# Patient Record
Sex: Male | Born: 1935
Health system: Southern US, Community
[De-identification: ages and names within clinical notes are randomized; demographics above are authoritative.]

## PROBLEM LIST (undated history)

## (undated) DIAGNOSIS — M810 Age-related osteoporosis without current pathological fracture: Secondary | ICD-10-CM

## (undated) DIAGNOSIS — K219 Gastro-esophageal reflux disease without esophagitis: Secondary | ICD-10-CM

## (undated) DIAGNOSIS — C61 Malignant neoplasm of prostate: Secondary | ICD-10-CM

## (undated) DIAGNOSIS — Z86010 Personal history of colonic polyps: Secondary | ICD-10-CM

## (undated) DIAGNOSIS — I255 Ischemic cardiomyopathy: Secondary | ICD-10-CM

## (undated) DIAGNOSIS — M549 Dorsalgia, unspecified: Secondary | ICD-10-CM

## (undated) DIAGNOSIS — R55 Syncope and collapse: Secondary | ICD-10-CM

## (undated) DIAGNOSIS — D509 Iron deficiency anemia, unspecified: Secondary | ICD-10-CM

## (undated) DIAGNOSIS — E1129 Type 2 diabetes mellitus with other diabetic kidney complication: Secondary | ICD-10-CM

## (undated) DIAGNOSIS — E785 Hyperlipidemia, unspecified: Secondary | ICD-10-CM

## (undated) DIAGNOSIS — R627 Adult failure to thrive: Secondary | ICD-10-CM

## (undated) DIAGNOSIS — R5381 Other malaise: Secondary | ICD-10-CM

## (undated) DIAGNOSIS — M4850XA Collapsed vertebra, not elsewhere classified, site unspecified, initial encounter for fracture: Secondary | ICD-10-CM

## (undated) DIAGNOSIS — I5042 Chronic combined systolic (congestive) and diastolic (congestive) heart failure: Secondary | ICD-10-CM

## (undated) DIAGNOSIS — N183 Chronic kidney disease, stage 3 (moderate): Secondary | ICD-10-CM

## (undated) DIAGNOSIS — R0600 Dyspnea, unspecified: Secondary | ICD-10-CM

## (undated) DIAGNOSIS — N259 Disorder resulting from impaired renal tubular function, unspecified: Secondary | ICD-10-CM

## (undated) DIAGNOSIS — I951 Orthostatic hypotension: Secondary | ICD-10-CM

## (undated) DIAGNOSIS — Z8719 Personal history of other diseases of the digestive system: Secondary | ICD-10-CM

## (undated) DIAGNOSIS — R634 Abnormal weight loss: Secondary | ICD-10-CM

## (undated) DIAGNOSIS — M79609 Pain in unspecified limb: Secondary | ICD-10-CM

## (undated) DIAGNOSIS — M1990 Unspecified osteoarthritis, unspecified site: Secondary | ICD-10-CM

## (undated) DIAGNOSIS — Z950 Presence of cardiac pacemaker: Secondary | ICD-10-CM

## (undated) DIAGNOSIS — L821 Other seborrheic keratosis: Secondary | ICD-10-CM

## (undated) DIAGNOSIS — M25462 Effusion, left knee: Secondary | ICD-10-CM

## (undated) DIAGNOSIS — H9190 Unspecified hearing loss, unspecified ear: Secondary | ICD-10-CM

## (undated) DIAGNOSIS — I6529 Occlusion and stenosis of unspecified carotid artery: Secondary | ICD-10-CM

## (undated) DIAGNOSIS — E119 Type 2 diabetes mellitus without complications: Secondary | ICD-10-CM

## (undated) DIAGNOSIS — E43 Unspecified severe protein-calorie malnutrition: Secondary | ICD-10-CM

## (undated) DIAGNOSIS — E1351 Other specified diabetes mellitus with diabetic peripheral angiopathy without gangrene: Secondary | ICD-10-CM

## (undated) DIAGNOSIS — N400 Enlarged prostate without lower urinary tract symptoms: Secondary | ICD-10-CM

## (undated) DIAGNOSIS — IMO0001 Reserved for inherently not codable concepts without codable children: Secondary | ICD-10-CM

## (undated) DIAGNOSIS — I739 Peripheral vascular disease, unspecified: Secondary | ICD-10-CM

## (undated) DIAGNOSIS — G629 Polyneuropathy, unspecified: Secondary | ICD-10-CM

## (undated) DIAGNOSIS — I82409 Acute embolism and thrombosis of unspecified deep veins of unspecified lower extremity: Secondary | ICD-10-CM

## (undated) DIAGNOSIS — E1022 Type 1 diabetes mellitus with diabetic chronic kidney disease: Secondary | ICD-10-CM

## (undated) DIAGNOSIS — R5383 Other fatigue: Secondary | ICD-10-CM

## (undated) DIAGNOSIS — F329 Major depressive disorder, single episode, unspecified: Secondary | ICD-10-CM

## (undated) DIAGNOSIS — I441 Atrioventricular block, second degree: Secondary | ICD-10-CM

## (undated) DIAGNOSIS — I10 Essential (primary) hypertension: Secondary | ICD-10-CM

## (undated) DIAGNOSIS — I2510 Atherosclerotic heart disease of native coronary artery without angina pectoris: Secondary | ICD-10-CM

## (undated) DIAGNOSIS — R413 Other amnesia: Secondary | ICD-10-CM

## (undated) DIAGNOSIS — R531 Weakness: Secondary | ICD-10-CM

## (undated) DIAGNOSIS — F411 Generalized anxiety disorder: Secondary | ICD-10-CM

## (undated) HISTORY — DX: Peripheral vascular disease, unspecified: I73.9

## (undated) HISTORY — DX: Age-related osteoporosis without current pathological fracture: M81.0

## (undated) HISTORY — DX: Unspecified severe protein-calorie malnutrition: E43

## (undated) HISTORY — DX: Type 2 diabetes mellitus without complications: E11.9

## (undated) HISTORY — DX: Generalized anxiety disorder: F41.1

## (undated) HISTORY — DX: Collapsed vertebra, not elsewhere classified, site unspecified, initial encounter for fracture: M48.50XA

## (undated) HISTORY — DX: Acute embolism and thrombosis of unspecified deep veins of unspecified lower extremity: I82.409

## (undated) HISTORY — PX: CHOLECYSTECTOMY: SHX55

## (undated) HISTORY — DX: Hyperlipidemia, unspecified: E78.5

## (undated) HISTORY — DX: Dorsalgia, unspecified: M54.9

## (undated) HISTORY — DX: Personal history of other diseases of the digestive system: Z87.19

## (undated) HISTORY — DX: Pain in unspecified limb: M79.609

## (undated) HISTORY — DX: Other specified diabetes mellitus with diabetic peripheral angiopathy without gangrene: E13.51

## (undated) HISTORY — DX: Malignant neoplasm of prostate: C61

## (undated) HISTORY — DX: Chronic kidney disease, stage 3 (moderate): N18.3

## (undated) HISTORY — DX: Other amnesia: R41.3

## (undated) HISTORY — DX: Reserved for inherently not codable concepts without codable children: IMO0001

## (undated) HISTORY — DX: Other malaise: R53.81

## (undated) HISTORY — DX: Adult failure to thrive: R62.7

## (undated) HISTORY — DX: Disorder resulting from impaired renal tubular function, unspecified: N25.9

## (undated) HISTORY — DX: Dyspnea, unspecified: R06.00

## (undated) HISTORY — DX: Unspecified hearing loss, unspecified ear: H91.90

## (undated) HISTORY — PX: CYSTOSCOPY: SUR368

## (undated) HISTORY — DX: Atrioventricular block, second degree: I44.1

## (undated) HISTORY — DX: Benign prostatic hyperplasia without lower urinary tract symptoms: N40.0

## (undated) HISTORY — DX: Essential (primary) hypertension: I10

## (undated) HISTORY — PX: OTHER SURGICAL HISTORY: SHX169

## (undated) HISTORY — DX: Other seborrheic keratosis: L82.1

## (undated) HISTORY — DX: Orthostatic hypotension: I95.1

## (undated) HISTORY — DX: Iron deficiency anemia, unspecified: D50.9

## (undated) HISTORY — DX: Abnormal weight loss: R63.4

## (undated) HISTORY — DX: Other fatigue: R53.83

## (undated) HISTORY — DX: Personal history of colonic polyps: Z86.010

## (undated) HISTORY — DX: Atherosclerotic heart disease of native coronary artery without angina pectoris: I25.10

## (undated) HISTORY — DX: Gastro-esophageal reflux disease without esophagitis: K21.9

## (undated) HISTORY — DX: Major depressive disorder, single episode, unspecified: F32.9

## (undated) HISTORY — DX: Presence of cardiac pacemaker: Z95.0

## (undated) HISTORY — DX: Syncope and collapse: R55

## (undated) HISTORY — DX: Type 2 diabetes mellitus with other diabetic kidney complication: E11.29

## (undated) HISTORY — DX: Occlusion and stenosis of unspecified carotid artery: I65.29

## (undated) HISTORY — DX: Weakness: R53.1

## (undated) HISTORY — DX: Effusion, left knee: M25.462

## (undated) HISTORY — DX: Type 1 diabetes mellitus with diabetic chronic kidney disease: E10.22

## (undated) HISTORY — DX: Unspecified osteoarthritis, unspecified site: M19.90

## (undated) HISTORY — DX: Chronic combined systolic (congestive) and diastolic (congestive) heart failure: I50.42

## (undated) HISTORY — PX: APPENDECTOMY: SHX54

---

## 1990-12-24 HISTORY — PX: OTHER SURGICAL HISTORY: SHX169

## 1998-06-15 ENCOUNTER — Emergency Department (HOSPITAL_COMMUNITY): Admission: EM | Admit: 1998-06-15 | Discharge: 1998-06-15 | Payer: Self-pay | Admitting: Emergency Medicine

## 2000-04-12 ENCOUNTER — Ambulatory Visit (HOSPITAL_COMMUNITY): Admission: RE | Admit: 2000-04-12 | Discharge: 2000-04-12 | Payer: Self-pay | Admitting: Ophthalmology

## 2000-10-14 ENCOUNTER — Encounter: Admission: RE | Admit: 2000-10-14 | Discharge: 2000-10-14 | Payer: Self-pay | Admitting: Orthopedic Surgery

## 2000-10-14 ENCOUNTER — Encounter: Payer: Self-pay | Admitting: Orthopedic Surgery

## 2000-10-15 ENCOUNTER — Ambulatory Visit (HOSPITAL_BASED_OUTPATIENT_CLINIC_OR_DEPARTMENT_OTHER): Admission: RE | Admit: 2000-10-15 | Discharge: 2000-10-15 | Payer: Self-pay | Admitting: Orthopedic Surgery

## 2000-10-21 ENCOUNTER — Encounter: Admission: RE | Admit: 2000-10-21 | Discharge: 2000-11-07 | Payer: Self-pay | Admitting: Orthopedic Surgery

## 2001-10-31 ENCOUNTER — Observation Stay (HOSPITAL_COMMUNITY): Admission: EM | Admit: 2001-10-31 | Discharge: 2001-11-01 | Payer: Self-pay | Admitting: Emergency Medicine

## 2001-10-31 ENCOUNTER — Encounter: Payer: Self-pay | Admitting: Internal Medicine

## 2002-05-24 ENCOUNTER — Encounter: Payer: Self-pay | Admitting: Emergency Medicine

## 2002-05-24 ENCOUNTER — Emergency Department (HOSPITAL_COMMUNITY): Admission: EM | Admit: 2002-05-24 | Discharge: 2002-05-24 | Payer: Self-pay | Admitting: Emergency Medicine

## 2002-08-20 ENCOUNTER — Encounter: Payer: Self-pay | Admitting: Orthopedic Surgery

## 2002-08-20 ENCOUNTER — Inpatient Hospital Stay (HOSPITAL_COMMUNITY): Admission: RE | Admit: 2002-08-20 | Discharge: 2002-08-21 | Payer: Self-pay | Admitting: Orthopedic Surgery

## 2003-02-09 ENCOUNTER — Ambulatory Visit (HOSPITAL_COMMUNITY): Admission: RE | Admit: 2003-02-09 | Discharge: 2003-02-09 | Payer: Self-pay | Admitting: Orthopedic Surgery

## 2003-02-09 ENCOUNTER — Encounter: Payer: Self-pay | Admitting: Orthopedic Surgery

## 2003-02-18 ENCOUNTER — Encounter: Payer: Self-pay | Admitting: Endocrinology

## 2003-02-18 ENCOUNTER — Inpatient Hospital Stay (HOSPITAL_COMMUNITY): Admission: EM | Admit: 2003-02-18 | Discharge: 2003-02-22 | Payer: Self-pay | Admitting: Endocrinology

## 2003-02-19 ENCOUNTER — Encounter (INDEPENDENT_AMBULATORY_CARE_PROVIDER_SITE_OTHER): Payer: Self-pay | Admitting: Specialist

## 2003-04-12 ENCOUNTER — Inpatient Hospital Stay (HOSPITAL_COMMUNITY): Admission: EM | Admit: 2003-04-12 | Discharge: 2003-04-15 | Payer: Self-pay | Admitting: Endocrinology

## 2003-04-12 ENCOUNTER — Encounter: Payer: Self-pay | Admitting: Internal Medicine

## 2004-10-25 ENCOUNTER — Ambulatory Visit: Payer: Self-pay | Admitting: Internal Medicine

## 2004-12-06 ENCOUNTER — Ambulatory Visit: Payer: Self-pay | Admitting: Internal Medicine

## 2004-12-15 ENCOUNTER — Ambulatory Visit: Payer: Self-pay | Admitting: Internal Medicine

## 2005-01-12 ENCOUNTER — Ambulatory Visit: Payer: Self-pay | Admitting: Internal Medicine

## 2005-02-26 ENCOUNTER — Ambulatory Visit: Payer: Self-pay | Admitting: Internal Medicine

## 2005-06-06 ENCOUNTER — Ambulatory Visit: Payer: Self-pay | Admitting: Internal Medicine

## 2005-09-13 ENCOUNTER — Ambulatory Visit: Payer: Self-pay | Admitting: Cardiology

## 2005-09-27 ENCOUNTER — Ambulatory Visit: Payer: Self-pay

## 2005-11-06 ENCOUNTER — Ambulatory Visit: Payer: Self-pay | Admitting: Internal Medicine

## 2005-12-22 ENCOUNTER — Ambulatory Visit: Payer: Self-pay | Admitting: Family Medicine

## 2005-12-24 HISTORY — PX: CORONARY ARTERY BYPASS GRAFT: SHX141

## 2005-12-25 ENCOUNTER — Ambulatory Visit: Payer: Self-pay | Admitting: Internal Medicine

## 2006-03-07 ENCOUNTER — Ambulatory Visit: Payer: Self-pay | Admitting: Internal Medicine

## 2006-03-18 ENCOUNTER — Emergency Department (HOSPITAL_COMMUNITY): Admission: EM | Admit: 2006-03-18 | Discharge: 2006-03-18 | Payer: Self-pay | Admitting: Emergency Medicine

## 2006-05-10 ENCOUNTER — Ambulatory Visit: Payer: Self-pay | Admitting: Internal Medicine

## 2006-05-24 ENCOUNTER — Ambulatory Visit: Payer: Self-pay | Admitting: Internal Medicine

## 2006-06-11 ENCOUNTER — Ambulatory Visit: Payer: Self-pay | Admitting: Internal Medicine

## 2006-06-14 ENCOUNTER — Ambulatory Visit: Payer: Self-pay | Admitting: Internal Medicine

## 2006-06-24 ENCOUNTER — Ambulatory Visit: Payer: Self-pay | Admitting: Internal Medicine

## 2006-07-01 ENCOUNTER — Ambulatory Visit: Payer: Self-pay | Admitting: Internal Medicine

## 2006-07-04 ENCOUNTER — Ambulatory Visit: Payer: Self-pay

## 2006-07-04 ENCOUNTER — Encounter: Payer: Self-pay | Admitting: Cardiology

## 2006-07-25 ENCOUNTER — Encounter (INDEPENDENT_AMBULATORY_CARE_PROVIDER_SITE_OTHER): Payer: Self-pay | Admitting: *Deleted

## 2006-07-25 ENCOUNTER — Ambulatory Visit: Payer: Self-pay | Admitting: Internal Medicine

## 2006-07-26 ENCOUNTER — Ambulatory Visit: Payer: Self-pay | Admitting: *Deleted

## 2006-07-26 ENCOUNTER — Inpatient Hospital Stay (HOSPITAL_COMMUNITY): Admission: EM | Admit: 2006-07-26 | Discharge: 2006-07-30 | Payer: Self-pay | Admitting: Emergency Medicine

## 2006-08-01 ENCOUNTER — Ambulatory Visit: Payer: Self-pay | Admitting: Internal Medicine

## 2006-08-07 ENCOUNTER — Ambulatory Visit: Payer: Self-pay | Admitting: Cardiology

## 2006-08-15 ENCOUNTER — Ambulatory Visit: Payer: Self-pay

## 2006-09-05 ENCOUNTER — Ambulatory Visit: Payer: Self-pay | Admitting: Cardiology

## 2006-10-02 ENCOUNTER — Ambulatory Visit: Payer: Self-pay | Admitting: Internal Medicine

## 2006-10-29 ENCOUNTER — Ambulatory Visit: Payer: Self-pay | Admitting: Internal Medicine

## 2006-11-04 ENCOUNTER — Ambulatory Visit: Payer: Self-pay | Admitting: Internal Medicine

## 2007-01-02 ENCOUNTER — Ambulatory Visit: Payer: Self-pay | Admitting: Internal Medicine

## 2007-01-02 LAB — CONVERTED CEMR LAB
ALT: 18 units/L (ref 0–40)
Albumin: 3.3 g/dL — ABNORMAL LOW (ref 3.5–5.2)
Basophils Relative: 0.8 % (ref 0.0–1.0)
Bilirubin Urine: NEGATIVE
Calcium: 9.8 mg/dL (ref 8.4–10.5)
Chloride: 108 meq/L (ref 96–112)
Chol/HDL Ratio, serum: 4.4
Cholesterol: 120 mg/dL (ref 0–200)
Creatinine, Ser: 1.4 mg/dL (ref 0.4–1.5)
Eosinophil percent: 7.1 % — ABNORMAL HIGH (ref 0.0–5.0)
Glomerular Filtration Rate, Af Am: 63 mL/min/{1.73_m2}
HCT: 33.5 % — ABNORMAL LOW (ref 39.0–52.0)
Hemoglobin: 11.2 g/dL — ABNORMAL LOW (ref 13.0–17.0)
Ketones, ur: NEGATIVE mg/dL
Lymphocytes Relative: 19.9 % (ref 12.0–46.0)
MCHC: 33.5 g/dL (ref 30.0–36.0)
MCV: 97.4 fL (ref 78.0–100.0)
Microalb Creat Ratio: 2.9 mg/g (ref 0.0–30.0)
Neutrophils Relative %: 61.1 % (ref 43.0–77.0)
Nitrite: NEGATIVE
Platelets: 234 10*3/uL (ref 150–400)
RBC: 3.44 M/uL — ABNORMAL LOW (ref 4.22–5.81)
RDW: 13.5 % (ref 11.5–14.6)
Specific Gravity, Urine: >=1.03 (ref 1.000–1.03)
TSH: 2.4 microintl units/mL (ref 0.35–5.50)
Total Protein, Urine: NEGATIVE mg/dL
Triglyceride fasting, serum: 96 mg/dL (ref 0–149)
WBC: 5.4 10*3/uL (ref 4.5–10.5)
pH: 5.5 (ref 5.0–8.0)

## 2007-01-16 ENCOUNTER — Ambulatory Visit: Payer: Self-pay | Admitting: Cardiology

## 2007-03-15 ENCOUNTER — Emergency Department (HOSPITAL_COMMUNITY): Admission: EM | Admit: 2007-03-15 | Discharge: 2007-03-16 | Payer: Self-pay | Admitting: Emergency Medicine

## 2007-03-19 ENCOUNTER — Ambulatory Visit: Payer: Self-pay | Admitting: Cardiology

## 2007-05-27 DIAGNOSIS — Z860100 Personal history of colon polyps, unspecified: Secondary | ICD-10-CM

## 2007-05-27 DIAGNOSIS — Z8719 Personal history of other diseases of the digestive system: Secondary | ICD-10-CM | POA: Insufficient documentation

## 2007-05-27 DIAGNOSIS — D509 Iron deficiency anemia, unspecified: Secondary | ICD-10-CM | POA: Insufficient documentation

## 2007-05-27 HISTORY — DX: Personal history of colonic polyps: Z86.010

## 2007-05-27 HISTORY — DX: Personal history of other diseases of the digestive system: Z87.19

## 2007-05-27 HISTORY — DX: Iron deficiency anemia, unspecified: D50.9

## 2007-05-27 HISTORY — DX: Personal history of colon polyps, unspecified: Z86.0100

## 2007-09-23 ENCOUNTER — Ambulatory Visit: Payer: Self-pay | Admitting: Internal Medicine

## 2007-09-26 ENCOUNTER — Encounter: Admission: RE | Admit: 2007-09-26 | Discharge: 2007-09-26 | Payer: Self-pay | Admitting: Internal Medicine

## 2007-09-30 ENCOUNTER — Ambulatory Visit: Payer: Self-pay

## 2007-10-21 ENCOUNTER — Encounter: Payer: Self-pay | Admitting: Internal Medicine

## 2007-11-03 ENCOUNTER — Ambulatory Visit: Payer: Self-pay | Admitting: Cardiology

## 2007-11-14 ENCOUNTER — Ambulatory Visit: Payer: Self-pay | Admitting: Internal Medicine

## 2007-11-14 DIAGNOSIS — K219 Gastro-esophageal reflux disease without esophagitis: Secondary | ICD-10-CM

## 2007-11-14 DIAGNOSIS — F329 Major depressive disorder, single episode, unspecified: Secondary | ICD-10-CM | POA: Insufficient documentation

## 2007-11-14 DIAGNOSIS — N400 Enlarged prostate without lower urinary tract symptoms: Secondary | ICD-10-CM | POA: Insufficient documentation

## 2007-11-14 DIAGNOSIS — F411 Generalized anxiety disorder: Secondary | ICD-10-CM

## 2007-11-14 DIAGNOSIS — E785 Hyperlipidemia, unspecified: Secondary | ICD-10-CM

## 2007-11-14 DIAGNOSIS — I739 Peripheral vascular disease, unspecified: Secondary | ICD-10-CM

## 2007-11-14 DIAGNOSIS — I2510 Atherosclerotic heart disease of native coronary artery without angina pectoris: Secondary | ICD-10-CM | POA: Insufficient documentation

## 2007-11-14 DIAGNOSIS — I10 Essential (primary) hypertension: Secondary | ICD-10-CM

## 2007-11-14 DIAGNOSIS — N259 Disorder resulting from impaired renal tubular function, unspecified: Secondary | ICD-10-CM

## 2007-11-14 DIAGNOSIS — E119 Type 2 diabetes mellitus without complications: Secondary | ICD-10-CM

## 2007-11-14 DIAGNOSIS — F3289 Other specified depressive episodes: Secondary | ICD-10-CM

## 2007-11-14 DIAGNOSIS — I252 Old myocardial infarction: Secondary | ICD-10-CM | POA: Insufficient documentation

## 2007-11-14 HISTORY — DX: Disorder resulting from impaired renal tubular function, unspecified: N25.9

## 2007-11-14 HISTORY — DX: Peripheral vascular disease, unspecified: I73.9

## 2007-11-14 HISTORY — DX: Essential (primary) hypertension: I10

## 2007-11-14 HISTORY — DX: Generalized anxiety disorder: F41.1

## 2007-11-14 HISTORY — DX: Atherosclerotic heart disease of native coronary artery without angina pectoris: I25.10

## 2007-11-14 HISTORY — DX: Type 2 diabetes mellitus without complications: E11.9

## 2007-11-14 HISTORY — DX: Hyperlipidemia, unspecified: E78.5

## 2007-11-14 HISTORY — DX: Other specified depressive episodes: F32.89

## 2007-11-14 HISTORY — DX: Benign prostatic hyperplasia without lower urinary tract symptoms: N40.0

## 2007-11-14 HISTORY — DX: Gastro-esophageal reflux disease without esophagitis: K21.9

## 2007-11-14 HISTORY — DX: Major depressive disorder, single episode, unspecified: F32.9

## 2007-11-21 ENCOUNTER — Telehealth (INDEPENDENT_AMBULATORY_CARE_PROVIDER_SITE_OTHER): Payer: Self-pay | Admitting: *Deleted

## 2008-01-22 ENCOUNTER — Ambulatory Visit: Payer: Self-pay | Admitting: Internal Medicine

## 2008-01-22 DIAGNOSIS — R21 Rash and other nonspecific skin eruption: Secondary | ICD-10-CM

## 2008-01-24 LAB — CONVERTED CEMR LAB
CO2: 24 meq/L (ref 19–32)
Chloride: 111 meq/L (ref 96–112)
GFR calc non Af Amer: 48 mL/min
LDL Cholesterol: 82 mg/dL (ref 0–99)
Sodium: 141 meq/L (ref 135–145)
Total CHOL/HDL Ratio: 4.6
VLDL: 21 mg/dL (ref 0–40)

## 2008-02-02 ENCOUNTER — Ambulatory Visit: Payer: Self-pay | Admitting: Internal Medicine

## 2008-02-02 DIAGNOSIS — Z950 Presence of cardiac pacemaker: Secondary | ICD-10-CM

## 2008-02-02 DIAGNOSIS — L03211 Cellulitis of face: Secondary | ICD-10-CM

## 2008-02-02 DIAGNOSIS — L0201 Cutaneous abscess of face: Secondary | ICD-10-CM

## 2008-02-02 HISTORY — DX: Presence of cardiac pacemaker: Z95.0

## 2008-02-20 ENCOUNTER — Ambulatory Visit: Payer: Self-pay | Admitting: Internal Medicine

## 2008-04-05 ENCOUNTER — Encounter: Payer: Self-pay | Admitting: Internal Medicine

## 2008-05-03 ENCOUNTER — Ambulatory Visit: Payer: Self-pay | Admitting: Cardiology

## 2008-05-10 ENCOUNTER — Encounter: Payer: Self-pay | Admitting: Internal Medicine

## 2008-06-01 ENCOUNTER — Ambulatory Visit: Payer: Self-pay | Admitting: Internal Medicine

## 2008-06-01 DIAGNOSIS — R413 Other amnesia: Secondary | ICD-10-CM

## 2008-06-01 HISTORY — DX: Other amnesia: R41.3

## 2008-06-16 ENCOUNTER — Encounter: Payer: Self-pay | Admitting: Internal Medicine

## 2008-08-27 ENCOUNTER — Telehealth: Payer: Self-pay | Admitting: Internal Medicine

## 2008-09-08 ENCOUNTER — Ambulatory Visit: Payer: Self-pay | Admitting: Internal Medicine

## 2008-09-08 DIAGNOSIS — R55 Syncope and collapse: Secondary | ICD-10-CM | POA: Insufficient documentation

## 2008-09-08 HISTORY — DX: Syncope and collapse: R55

## 2008-09-08 LAB — CONVERTED CEMR LAB
Albumin: 3.8 g/dL (ref 3.5–5.2)
Basophils Absolute: 0 10*3/uL (ref 0.0–0.1)
Chloride: 112 meq/L (ref 96–112)
Cholesterol: 161 mg/dL (ref 0–200)
GFR calc Af Amer: 47 mL/min
GFR calc non Af Amer: 38 mL/min
Glucose, Bld: 153 mg/dL — ABNORMAL HIGH (ref 70–99)
HCT: 38.8 % — ABNORMAL LOW (ref 39.0–52.0)
HDL: 27.1 mg/dL — ABNORMAL LOW (ref >39.0)
Hemoglobin: 13.6 g/dL (ref 13.0–17.0)
LDL Cholesterol: 100 mg/dL — ABNORMAL HIGH (ref 0–99)
Lymphocytes Relative: 28.9 % (ref 12.0–46.0)
Monocytes Relative: 9.2 % (ref 3.0–12.0)
Potassium: 5.5 meq/L — ABNORMAL HIGH (ref 3.5–5.1)
RDW: 13.9 % (ref 11.5–14.6)
Sodium: 140 meq/L (ref 135–145)
Triglycerides: 169 mg/dL — ABNORMAL HIGH (ref 0–149)
VLDL: 34 mg/dL (ref 0–40)

## 2008-09-17 ENCOUNTER — Encounter: Payer: Self-pay | Admitting: Internal Medicine

## 2008-09-17 ENCOUNTER — Ambulatory Visit: Payer: Self-pay

## 2008-09-21 ENCOUNTER — Ambulatory Visit: Payer: Self-pay | Admitting: Cardiology

## 2008-09-24 ENCOUNTER — Ambulatory Visit: Payer: Self-pay | Admitting: Internal Medicine

## 2008-09-24 DIAGNOSIS — R109 Unspecified abdominal pain: Secondary | ICD-10-CM

## 2008-09-24 LAB — CONVERTED CEMR LAB
Bilirubin Urine: NEGATIVE
Crystals: NEGATIVE
Leukocytes, UA: NEGATIVE
Mucus, UA: NEGATIVE
Nitrite: NEGATIVE
Urobilinogen, UA: 0.2 (ref 0.0–1.0)

## 2008-09-30 ENCOUNTER — Telehealth: Payer: Self-pay | Admitting: Internal Medicine

## 2008-10-05 ENCOUNTER — Telehealth: Payer: Self-pay | Admitting: Internal Medicine

## 2008-10-20 ENCOUNTER — Encounter: Payer: Self-pay | Admitting: Internal Medicine

## 2008-11-09 ENCOUNTER — Ambulatory Visit: Payer: Self-pay | Admitting: Cardiology

## 2009-02-09 ENCOUNTER — Ambulatory Visit: Payer: Self-pay | Admitting: Cardiology

## 2009-03-18 ENCOUNTER — Encounter: Payer: Self-pay | Admitting: Internal Medicine

## 2009-03-31 ENCOUNTER — Ambulatory Visit: Payer: Self-pay | Admitting: Internal Medicine

## 2009-03-31 LAB — CONVERTED CEMR LAB
Calcium: 9.1 mg/dL (ref 8.4–10.5)
Chloride: 109 meq/L (ref 96–112)
GFR calc non Af Amer: 51.35 mL/min (ref >60)
Glucose, Bld: 168 mg/dL — ABNORMAL HIGH (ref 70–99)
Total CHOL/HDL Ratio: 6
Triglycerides: 238 mg/dL — ABNORMAL HIGH (ref 0.0–149.0)
VLDL: 47.6 mg/dL — ABNORMAL HIGH (ref 0.0–40.0)

## 2009-04-27 ENCOUNTER — Telehealth: Payer: Self-pay | Admitting: Internal Medicine

## 2009-05-13 ENCOUNTER — Ambulatory Visit: Payer: Self-pay | Admitting: Internal Medicine

## 2009-05-13 DIAGNOSIS — H9190 Unspecified hearing loss, unspecified ear: Secondary | ICD-10-CM

## 2009-05-13 HISTORY — DX: Unspecified hearing loss, unspecified ear: H91.90

## 2009-05-17 ENCOUNTER — Encounter: Payer: Self-pay | Admitting: Internal Medicine

## 2009-07-05 ENCOUNTER — Ambulatory Visit: Payer: Self-pay | Admitting: Cardiology

## 2009-09-05 ENCOUNTER — Ambulatory Visit: Payer: Self-pay | Admitting: Internal Medicine

## 2009-09-05 ENCOUNTER — Encounter: Payer: Self-pay | Admitting: Internal Medicine

## 2009-09-05 DIAGNOSIS — M4850XA Collapsed vertebra, not elsewhere classified, site unspecified, initial encounter for fracture: Secondary | ICD-10-CM

## 2009-09-05 DIAGNOSIS — M549 Dorsalgia, unspecified: Secondary | ICD-10-CM

## 2009-09-05 DIAGNOSIS — I441 Atrioventricular block, second degree: Secondary | ICD-10-CM

## 2009-09-05 HISTORY — DX: Collapsed vertebra, not elsewhere classified, site unspecified, initial encounter for fracture: M48.50XA

## 2009-09-05 HISTORY — DX: Atrioventricular block, second degree: I44.1

## 2009-09-06 ENCOUNTER — Telehealth: Payer: Self-pay | Admitting: Internal Medicine

## 2009-09-06 LAB — CONVERTED CEMR LAB
BUN: 29 mg/dL — ABNORMAL HIGH (ref 6–23)
Calcium: 9.4 mg/dL (ref 8.4–10.5)
Creatinine, Ser: 1.2 mg/dL (ref 0.4–1.5)
GFR calc non Af Amer: 61.28 mL/min (ref >60)
Glucose, Bld: 289 mg/dL — ABNORMAL HIGH (ref 70–99)

## 2009-09-07 ENCOUNTER — Ambulatory Visit: Payer: Self-pay | Admitting: Internal Medicine

## 2009-09-07 ENCOUNTER — Encounter: Payer: Self-pay | Admitting: Internal Medicine

## 2009-09-19 ENCOUNTER — Encounter: Payer: Self-pay | Admitting: Internal Medicine

## 2009-09-22 ENCOUNTER — Telehealth: Payer: Self-pay | Admitting: Internal Medicine

## 2009-09-22 ENCOUNTER — Encounter (INDEPENDENT_AMBULATORY_CARE_PROVIDER_SITE_OTHER): Payer: Self-pay | Admitting: *Deleted

## 2009-10-04 ENCOUNTER — Ambulatory Visit: Payer: Self-pay

## 2009-10-04 ENCOUNTER — Encounter: Payer: Self-pay | Admitting: Cardiology

## 2009-11-11 ENCOUNTER — Telehealth: Payer: Self-pay | Admitting: Internal Medicine

## 2009-12-21 ENCOUNTER — Encounter: Payer: Self-pay | Admitting: Internal Medicine

## 2010-03-07 ENCOUNTER — Ambulatory Visit: Payer: Self-pay | Admitting: Internal Medicine

## 2010-04-10 ENCOUNTER — Encounter: Payer: Self-pay | Admitting: Internal Medicine

## 2010-06-02 ENCOUNTER — Ambulatory Visit: Payer: Self-pay | Admitting: Internal Medicine

## 2010-06-02 DIAGNOSIS — M810 Age-related osteoporosis without current pathological fracture: Secondary | ICD-10-CM | POA: Insufficient documentation

## 2010-06-02 HISTORY — DX: Age-related osteoporosis without current pathological fracture: M81.0

## 2010-06-05 LAB — CONVERTED CEMR LAB
AST: 21 units/L (ref 0–37)
Basophils Absolute: 0 10*3/uL (ref 0.0–0.1)
Basophils Relative: 0.2 % (ref 0.0–3.0)
Calcium: 9.3 mg/dL (ref 8.4–10.5)
Chloride: 109 meq/L (ref 96–112)
Cholesterol: 147 mg/dL (ref 0–200)
Eosinophils Relative: 3.8 % (ref 0.0–5.0)
Folate: 7.1 ng/mL
GFR calc non Af Amer: 52.5 mL/min (ref >60)
Glucose, Bld: 120 mg/dL — ABNORMAL HIGH (ref 70–99)
LDL Cholesterol: 83 mg/dL (ref 0–99)
Leukocytes, UA: NEGATIVE
MCHC: 34.8 g/dL (ref 30.0–36.0)
MCV: 92 fL (ref 78.0–100.0)
Microalb Creat Ratio: 4.2 mg/g (ref 0.0–30.0)
Platelets: 149 10*3/uL — ABNORMAL LOW (ref 150.0–400.0)
RBC: 4.89 M/uL (ref 4.22–5.81)
RDW: 13.4 % (ref 11.5–14.6)
Specific Gravity, Urine: >=1.03 (ref 1.000–1.030)
TSH: 1.83 microintl units/mL (ref 0.35–5.50)
Urine Glucose: 100 mg/dL
Urobilinogen, UA: 0.2 (ref 0.0–1.0)
VLDL: 29 mg/dL (ref 0.0–40.0)
WBC: 7.2 10*3/uL (ref 4.5–10.5)
pH: 5 (ref 5.0–8.0)

## 2010-06-06 ENCOUNTER — Telehealth: Payer: Self-pay | Admitting: Internal Medicine

## 2010-06-06 LAB — CONVERTED CEMR LAB
Calcium, Total (PTH): 9.5 mg/dL (ref 8.4–10.5)
PTH: 101.6 pg/mL — ABNORMAL HIGH (ref 14.0–72.0)

## 2010-07-19 ENCOUNTER — Ambulatory Visit: Payer: Self-pay | Admitting: Cardiology

## 2010-07-19 ENCOUNTER — Ambulatory Visit: Payer: Self-pay | Admitting: Internal Medicine

## 2010-07-19 LAB — CONVERTED CEMR LAB
CO2: 25 meq/L (ref 19–32)
Calcium: 8.3 mg/dL — ABNORMAL LOW (ref 8.4–10.5)
Chloride: 99 meq/L (ref 96–112)
Cholesterol: 187 mg/dL (ref 0–200)
Direct LDL: 85.6 mg/dL
Glucose, Bld: 179 mg/dL — ABNORMAL HIGH (ref 70–99)

## 2010-10-02 ENCOUNTER — Encounter: Payer: Self-pay | Admitting: Internal Medicine

## 2010-10-02 DIAGNOSIS — I6529 Occlusion and stenosis of unspecified carotid artery: Secondary | ICD-10-CM

## 2010-10-02 HISTORY — DX: Occlusion and stenosis of unspecified carotid artery: I65.29

## 2010-10-31 ENCOUNTER — Encounter: Payer: Self-pay | Admitting: Internal Medicine

## 2010-11-03 ENCOUNTER — Telehealth: Payer: Self-pay | Admitting: Cardiology

## 2010-11-07 ENCOUNTER — Ambulatory Visit: Payer: Self-pay

## 2010-11-07 ENCOUNTER — Encounter: Payer: Self-pay | Admitting: Cardiovascular Disease

## 2010-11-07 DIAGNOSIS — E1351 Other specified diabetes mellitus with diabetic peripheral angiopathy without gangrene: Secondary | ICD-10-CM

## 2010-11-07 HISTORY — DX: Other specified diabetes mellitus with diabetic peripheral angiopathy without gangrene: E13.51

## 2010-11-09 ENCOUNTER — Ambulatory Visit: Payer: Self-pay | Admitting: Cardiovascular Disease

## 2010-11-27 ENCOUNTER — Telehealth: Payer: Self-pay | Admitting: Internal Medicine

## 2010-12-01 ENCOUNTER — Ambulatory Visit: Payer: Self-pay | Admitting: Internal Medicine

## 2010-12-01 LAB — CONVERTED CEMR LAB
Calcium: 9.2 mg/dL (ref 8.4–10.5)
Cholesterol: 139 mg/dL (ref 0–200)
Creatinine, Ser: 1.1 mg/dL (ref 0.4–1.5)
HDL: 27.2 mg/dL — ABNORMAL LOW (ref >39.00)
Hgb A1c MFr Bld: 7 % — ABNORMAL HIGH (ref 4.6–6.5)
Potassium: 5.3 meq/L — ABNORMAL HIGH (ref 3.5–5.1)
Sodium: 140 meq/L (ref 135–145)
Triglycerides: 174 mg/dL — ABNORMAL HIGH (ref 0.0–149.0)

## 2010-12-08 ENCOUNTER — Ambulatory Visit: Payer: Self-pay | Admitting: Internal Medicine

## 2010-12-08 DIAGNOSIS — M79609 Pain in unspecified limb: Secondary | ICD-10-CM

## 2010-12-08 HISTORY — DX: Pain in unspecified limb: M79.609

## 2010-12-24 HISTORY — PX: PACEMAKER INSERTION: SHX728

## 2011-01-15 ENCOUNTER — Ambulatory Visit
Admission: RE | Admit: 2011-01-15 | Discharge: 2011-01-15 | Payer: Self-pay | Source: Home / Self Care | Attending: Cardiology | Admitting: Cardiology

## 2011-01-15 ENCOUNTER — Telehealth: Payer: Self-pay | Admitting: Internal Medicine

## 2011-01-15 DIAGNOSIS — R609 Edema, unspecified: Secondary | ICD-10-CM | POA: Insufficient documentation

## 2011-01-18 ENCOUNTER — Telehealth: Payer: Self-pay | Admitting: Internal Medicine

## 2011-01-21 LAB — CONVERTED CEMR LAB
BUN: 25 mg/dL — ABNORMAL HIGH (ref 6–23)
Basophils Absolute: 0.2 10*3/uL — ABNORMAL HIGH (ref 0.0–0.1)
Bilirubin, Direct: 0.2 mg/dL (ref 0.0–0.3)
Calcium: 8.9 mg/dL (ref 8.4–10.5)
Cholesterol: 133 mg/dL (ref 0–200)
Eosinophils Relative: 6.6 % — ABNORMAL HIGH (ref 0.0–5.0)
GFR calc Af Amer: 62 mL/min
GFR calc non Af Amer: 52 mL/min
Glucose, Bld: 138 mg/dL — ABNORMAL HIGH (ref 70–99)
HCT: 39.9 % (ref 39.0–52.0)
HDL: 23.2 mg/dL — ABNORMAL LOW (ref >39.0)
Hemoglobin, Urine: NEGATIVE
Hemoglobin: 13.6 g/dL (ref 13.0–17.0)
Iron: 213 ug/dL — ABNORMAL HIGH (ref 42–165)
Leukocytes, UA: NEGATIVE
MCHC: 34.2 g/dL (ref 30.0–36.0)
MCV: 93 fL (ref 78.0–100.0)
Microalb, Ur: 2.5 mg/dL — ABNORMAL HIGH (ref 0.0–1.9)
Monocytes Absolute: 0.4 10*3/uL (ref 0.1–1.0)
Neutrophils Relative %: 54.4 % (ref 43.0–77.0)
PSA: 1.02 ng/mL (ref 0.10–4.00)
RBC: 4.29 M/uL (ref 4.22–5.81)
RDW: 13 % (ref 11.5–14.6)
Saturation Ratios: 52.4 % — ABNORMAL HIGH (ref 20.0–50.0)
Specific Gravity, Urine: >=1.03 (ref 1.000–1.03)
TSH: 2.42 microintl units/mL (ref 0.35–5.50)
Total Bilirubin: 1.1 mg/dL (ref 0.3–1.2)
Total Protein: 6.4 g/dL (ref 6.0–8.3)
Triglycerides: 111 mg/dL (ref 0–149)
VLDL: 22 mg/dL (ref 0–40)
Vitamin B-12: 375 pg/mL (ref 211–911)

## 2011-01-24 ENCOUNTER — Telehealth: Payer: Self-pay | Admitting: Internal Medicine

## 2011-01-25 ENCOUNTER — Telehealth: Payer: Self-pay | Admitting: Internal Medicine

## 2011-01-25 NOTE — Assessment & Plan Note (Signed)
Summary: 6 mos f/u #/cd   Vital Signs:  Patient profile:   77 year old male Height:      69.5 inches Weight:      200.13 pounds BMI:     29.24 O2 Sat:      97 % on Room air Temp:     97.4 degrees F oral Pulse rate:   54 / minute BP sitting:   124 / 80  (left arm) Cuff size:   regular  Vitals Entered By: Zella Ball Ewing CMA Duncan Dull) (December 08, 2010 9:15 AM)  O2 Flow:  Room air  Preventive Care Screening  Last Flu Shot:    Date:  09/23/2010    Results:  given   CC: 6 month ROV/RE   Primary Care Provider:  Oliver Barre MD  CC:  6 month ROV/RE.  History of Present Illness: here today to f/u with c/o persistent feet and lower leg pain , with recent PAD eval not c/w this pain;  Pt denies CP, worsening sob, doe, wheezing, orthopnea, pnd, worsening LE edema, palps, dizziness or syncope  Pt denies new neuro symptoms such as headache, facial or extremity weakness  Pt denies polydipsia, polyuria  Overall good compliance with meds, trying to follow low chol  diet, wt stable, little excercise however  No fever, wt loss, night sweats, loss of appetite or other constitutional symptoms  Overall good compliance with meds, and good tolerability.  Denies worsening depressive symptoms, suicidal ideation, or panic.    Preventive Screening-Counseling & Management      Drug Use:  no.    Problems Prior to Update: 1)  Foot Pain, Bilateral  (ICD-729.5) 2)  Secondary Dm W/peripheral Circ D/o Uncontrolled  (ICD-249.71) 3)  Carotid Artery Disease  (ICD-433.10) 4)  Osteoporosis  (ICD-733.00) 5)  Vertebral Fracture  (ICD-805.8) 6)  Back Pain  (ICD-724.5) 7)  Myocardial Infarction, Hx of  (ICD-412) 8)  Coronary Artery Disease  (ICD-414.00) 9)  Peripheral Vascular Disease  (ICD-443.9) 10)  Hyperlipidemia  (ICD-272.4) 11)  Hypertension  (ICD-401.9) 12)  Syncope  (ICD-780.2) 13)  Renal Insufficiency  (ICD-588.9) 14)  Gerd  (ICD-530.81) 15)  Diabetes Mellitus, Type II  (ICD-250.00) 16)  Anxiety   (ICD-300.00) 17)  Depression  (ICD-311) 18)  Benign Prostatic Hypertrophy  (ICD-600.00) 19)  Unspecified Hearing Loss  (ICD-389.9) 20)  Groin Pain  (ICD-789.09) 21)  Memory Loss  (ICD-780.93) 22)  Preventive Health Care  (ICD-V70.0) 23)  Cellulitis, Face  (ICD-682.0) 24)  Rash-nonvesicular  (ICD-782.1) 25)  Family History Diabetes 1st Degree Relative  (ICD-V18.0) 26)  Anemia-iron Deficiency  (ICD-280.9) 27)  Diverticulitis, Hx of  (ICD-V12.79) 28)  Colonic Polyps, Hx of  (ICD-V12.72)  Medications Prior to Update: 1)  Lisinopril 40 Mg Tabs (Lisinopril) .... Take 1 Tablet By Mouth Once A Day 2)  Metoprolol Succinate 50 Mg  Tb24 (Metoprolol Succinate) .Marland Kitchen.. 1po Qd 3)  Aspirin Ec 325 Mg Tbec (Aspirin) .... Take One Tablet By Mouth Daily 4)  Lipitor 40 Mg Tabs (Atorvastatin Calcium) .... One By Mouth Daily 5)  Uroxatral 10 Mg Tb24 (Alfuzosin Hcl) .... Take 1 Tablet By Mouth Once A Day 6)  Detrol La 4 Mg Xr24h-Cap (Tolterodine Tartrate) .Marland Kitchen.. 1 By Mouth Once Daily 7)  Tricor 145 Mg  Tabs (Fenofibrate) .Marland Kitchen.. 1 By Mouth Qd 8)  Glimepiride 4 Mg Tabs (Glimepiride) .Marland Kitchen.. 1 By Mouth Once Daily 9)  Onetouch Ultra Test  Strp (Glucose Blood) .... Use Asd 1 Once Daily 10)  Onetouch Lancets  Misc (Lancets) .... Use Asd 1 Once Daily 11)  Flexeril 5 Mg Tabs (Cyclobenzaprine Hcl) .Marland Kitchen.. 1po Three Times A Day As Needed Pain 12)  Actos 30 Mg Tabs (Pioglitazone Hcl) .Marland Kitchen.. 1 By Mouth Once Daily 13)  Metformin Hcl 500 Mg Tabs (Metformin Hcl) .... 2 By Mouth in The Am, and 1 By Mouth in The Pm 14)  Alendronate Sodium 70 Mg Tabs (Alendronate Sodium) .Marland Kitchen.. 1 By Mouth Q Wk 15)  Hydrocodone-Homatropine 5-1.5 Mg/37ml Syrp (Hydrocodone-Homatropine) .Marland Kitchen.. 1 Tsp By Mouth Q 6 Hrs As Needed Cough  Current Medications (verified): 1)  Lisinopril 40 Mg Tabs (Lisinopril) .... Take 1 Tablet By Mouth Once A Day 2)  Metoprolol Succinate 50 Mg  Tb24 (Metoprolol Succinate) .Marland Kitchen.. 1po Once Daily 3)  Aspirin Ec 325 Mg Tbec (Aspirin) ....  Take One Tablet By Mouth Daily 4)  Lipitor 40 Mg Tabs (Atorvastatin Calcium) .... One By Mouth Daily 5)  Detrol La 4 Mg Xr24h-Cap (Tolterodine Tartrate) .Marland Kitchen.. 1 By Mouth Once Daily 6)  Fenofibrate 160 Mg Tabs (Fenofibrate) .Marland Kitchen.. 1po Once Daily 7)  Glimepiride 4 Mg Tabs (Glimepiride) .Marland Kitchen.. 1 By Mouth Once Daily 8)  Onetouch Ultra Test  Strp (Glucose Blood) .... Use Asd 1 Once Daily 9)  Onetouch Lancets  Misc (Lancets) .... Use Asd 1 Once Daily 10)  Actos 30 Mg Tabs (Pioglitazone Hcl) .Marland Kitchen.. 1 By Mouth Once Daily 11)  Metformin Hcl 500 Mg Tabs (Metformin Hcl) .... 2 By Mouth in The Am, and 1 By Mouth in The Pm 12)  Alendronate Sodium 70 Mg Tabs (Alendronate Sodium) .Marland Kitchen.. 1 By Mouth Q Wk 13)  Cymbalta 60 Mg Cpep (Duloxetine Hcl) .Marland Kitchen.. 1po Once Daily  Allergies (verified): No Known Drug Allergies  Past History:  Past Medical History: Last updated: 11/09/2010 MYOCARDIAL INFARCTION, HX OF (ICD-412) 8/07 CORONARY ARTERY DISEASE (ICD-414.00) s/p CABG PERIPHERAL VASCULAR DISEASE (ICD-443.9) HYPERLIPIDEMIA (ICD-272.4) HYPERTENSION (ICD-401.9) SYNCOPE (ICD-780.2) RENAL INSUFFICIENCY (ICD-588.9) GERD (ICD-530.81) DIABETES MELLITUS, TYPE II (ICD-250.00) ANXIETY (ICD-300.00) DEPRESSION (ICD-311) BENIGN PROSTATIC HYPERTROPHY (ICD-600.00) UNSPECIFIED HEARING LOSS (ICD-389.9) GROIN PAIN (ICD-789.09) MEMORY LOSS (ICD-780.93) PREVENTIVE HEALTH CARE (ICD-V70.0) CELLULITIS, FACE (ICD-682.0) RASH-NONVESICULAR (ICD-782.1) FAMILY HISTORY DIABETES 1ST DEGREE RELATIVE (ICD-V18.0) ANEMIA-IRON DEFICIENCY (ICD-280.9) DIVERTICULITIS, HX OF (ICD-V12.79) COLONIC POLYPS, HX OF (ICD-V12.72) pelvic fx 3/07  Osteoporosis  Past Surgical History: Last updated: 07/01/2009 Coronary artery bypass graft s/p coronary stent x 1 Cholecystectomy s/p bilat knee replacement 1992  Social History: Last updated: 12/08/2010 Former Smoker Alcohol use-no retired - Designer, 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  , mild ill  Head:  normocephalic and atraumatic.   Eyes:  vision grossly intact, pupils equal, and pupils round.   Ears:  R ear normal and L ear normal.   Nose:  no external deformity and no nasal discharge.   Mouth:  no gingival abnormalities and pharynx pink and moist.   Neck:  supple and no masses.   Lungs:  normal respiratory effort and normal breath sounds.   Heart:  normal rate and regular rhythm.   Extremities:  no edema, no erythema  Neurologic:  strength normal in all extremities.  , and decresed sens to feet to LT  Impression & Recommendations:  Problem # 1:  FOOT PAIN, BILATERAL (ICD-729.5) ? neuritic - ok for trial cymblata, should cont f/u with podiatry as he does, and the f/u LE arterial dopplers as well , declines EMG/NCS today  Problem # 2:  DIABETES MELLITUS, TYPE II (ICD-250.00)  His updated medication list for this problem includes:    Lisinopril 40 Mg Tabs (Lisinopril) .Marland Kitchen... Take 1 tablet by mouth once a day    Aspirin Ec 325 Mg Tbec (Aspirin) .Marland Kitchen... Take one tablet by mouth daily    Glimepiride 4 Mg Tabs (Glimepiride) .Marland Kitchen... 1 by mouth once daily    Actos 30 Mg Tabs (Pioglitazone hcl) .Marland Kitchen... 1 by mouth once daily    Metformin Hcl 500 Mg Tabs (Metformin hcl) .Marland Kitchen... 2 by mouth in the am, and 1 by mouth in the pm  Labs Reviewed: Creat: 1.1 (12/01/2010)    Reviewed HgBA1c results: 7.0 (12/01/2010)  7.6 (07/19/2010) stable overall by hx and exam, ok to continue meds/tx as is , Pt to cont DM diet, excercise, wt control efforts; to check labs next visit, goal a1c < 7.0   Problem # 3:  HYPERTENSION (ICD-401.9)  His updated medication list for this  problem includes:    Lisinopril 40 Mg Tabs (Lisinopril) .Marland Kitchen... Take 1 tablet by mouth once a day    Metoprolol Succinate 50 Mg Tb24 (Metoprolol succinate) .Marland Kitchen... 1po once daily  BP today: 124/80 Prior BP: 142/72 (11/09/2010)  Labs Reviewed: K+: 5.3 (12/01/2010) Creat: : 1.1 (12/01/2010)   Chol: 139 (12/01/2010)   HDL: 27.20 (12/01/2010)   LDL: 77 (12/01/2010)   TG: 174.0 (12/01/2010) stable overall by hx and exam, ok to continue meds/tx as is   Problem # 4:  HYPERLIPIDEMIA (ICD-272.4)  His updated medication list for this problem includes:    Lipitor 40 Mg Tabs (Atorvastatin calcium) ..... One by mouth daily    Fenofibrate 160 Mg Tabs (Fenofibrate) .Marland Kitchen... 1po once daily  Labs Reviewed: SGOT: 21 (06/02/2010)   SGPT: 23 (06/02/2010)   HDL:27.20 (12/01/2010), 27.70 (07/19/2010)  LDL:77 (12/01/2010), 83 (06/02/2010)  Chol:139 (12/01/2010), 187 (07/19/2010)  Trig:174.0 (12/01/2010), 403.0 (07/19/2010) stable overall by hx and exam, ok to continue meds/tx as is , Pt to continue diet efforts, good med tolerance; to check labs - goal LDL less than 70   Complete Medication List: 1)  Lisinopril 40 Mg Tabs (Lisinopril) .... Take 1 tablet by mouth once a day 2)  Metoprolol Succinate 50 Mg Tb24 (Metoprolol succinate) .Marland Kitchen.. 1po once daily 3)  Aspirin Ec 325 Mg Tbec (Aspirin) .... Take one tablet by mouth daily 4)  Lipitor 40 Mg Tabs (Atorvastatin calcium) .... One by mouth daily 5)  Detrol La 4 Mg Xr24h-cap (Tolterodine tartrate) .Marland Kitchen.. 1 by mouth once daily 6)  Fenofibrate 160 Mg Tabs (Fenofibrate) .Marland Kitchen.. 1po once daily 7)  Glimepiride 4 Mg Tabs (Glimepiride) .Marland Kitchen.. 1 by mouth once daily 8)  Onetouch Ultra Test Strp (Glucose blood) .... Use asd 1 once daily 9)  Onetouch Lancets Misc (Lancets) .... Use asd 1 once daily 10)  Actos 30 Mg Tabs (Pioglitazone hcl) .Marland Kitchen.. 1 by mouth once daily 11)  Metformin Hcl 500 Mg Tabs (Metformin hcl) .... 2 by mouth in the am, and 1 by mouth in the pm 12)  Alendronate  Sodium 70 Mg Tabs (Alendronate sodium) .Marland Kitchen.. 1 by mouth q wk 13)  Cymbalta 60 Mg Cpep (Duloxetine hcl) .Marland Kitchen.. 1po once daily  Patient Instructions: 1)  please take the cymbalta medication for the ?  nerve pain in the feet - start at 30 mg per day for one wk, then 60 mg per day after that (you have the sample to start, then the prescription for after) 2)  stop the tricor 3)  start the fenofibrate 160 mg per day (ther generic) 4)  Continue all previous medications as before this visit  5)  Please schedule a follow-up appointment in 6 months for CPX with labs Prescriptions: LISINOPRIL 40 MG TABS (LISINOPRIL) Take 1 tablet by mouth once a day  #90 x 3   Entered and Authorized by:   Corwin Levins MD   Signed by:   Corwin Levins MD on 12/08/2010   Method used:   Print then Give to Patient   RxID:   [id number] METOPROLOL SUCCINATE 50 MG  TB24 (METOPROLOL SUCCINATE) 1po once daily  #90 x 3   Entered and Authorized by:   Corwin Levins MD   Signed by:   Corwin Levins MD on 12/08/2010   Method used:   Print then Give to Patient   RxID:   [id number] LIPITOR 40 MG TABS (ATORVASTATIN CALCIUM) one by mouth daily  #90 x 3   Entered and Authorized by:   Corwin Levins MD   Signed by:   Corwin Levins MD on 12/08/2010   Method used:   Print then Give to Patient   RxID:   [id number] UROXATRAL 10 MG TB24 (ALFUZOSIN HCL) Take 1 tablet by mouth once a day  #90 x 3   Entered and Authorized by:   Corwin Levins MD   Signed by:   Corwin Levins MD on 12/08/2010   Method used:   Print then Give to Patient   RxID:   [id number] DETROL LA 4 MG XR24H-CAP (TOLTERODINE TARTRATE) 1 by mouth once daily  #90 x 3   Entered and Authorized by:   Corwin Levins MD   Signed by:   Corwin Levins MD on 12/08/2010   Method used:   Print then Give to Patient   RxID:   [id number] FENOFIBRATE 160 MG TABS (FENOFIBRATE) 1po once daily  #90 x 3   Entered and Authorized by:   Corwin Levins MD   Signed by:   Corwin Levins  MD on 12/08/2010   Method used:   Print then Give to Patient   RxID:   [id number] GLIMEPIRIDE 4 MG TABS (GLIMEPIRIDE) 1 by mouth once daily  #90 x 3   Entered and Authorized by:   Corwin Levins MD   Signed by:   Corwin Levins MD on 12/08/2010   Method used:   Print then Give to Patient   RxID:   [id number] ONETOUCH ULTRA TEST  STRP (GLUCOSE BLOOD) use asd 1 once daily  #100 x 3   Entered and Authorized by:   Corwin Levins MD   Signed by:   Corwin Levins MD on 12/08/2010   Method used:   Print then Give to Patient   RxID:   [id number] ONETOUCH LANCETS  MISC (LANCETS) use asd 1 once daily  #100 x 3   Entered and Authorized by:   Corwin Levins MD   Signed by:   Corwin Levins MD on 12/08/2010   Method used:   Print then Give to Patient   RxID:   [id number] ACTOS 30 MG TABS (PIOGLITAZONE HCL) 1 by mouth once daily  #90 x 3   Entered and  Authorized by:   Corwin Levins MD   Signed by:   Corwin Levins MD on 12/08/2010   Method used:   Print then Give to Patient   RxID:   [id number] METFORMIN HCL 500 MG TABS (METFORMIN HCL) 2 by mouth in the am, and 1 by mouth in the PM  #270 x 3   Entered and Authorized by:   Corwin Levins MD   Signed by:   Corwin Levins MD on 12/08/2010   Method used:   Print then Give to Patient   RxID:   [id number] ALENDRONATE SODIUM 70 MG TABS (ALENDRONATE SODIUM) 1 by mouth q wk  #12 x 3   Entered and Authorized by:   Corwin Levins MD   Signed by:   Corwin Levins MD on 12/08/2010   Method used:   Print then Give to Patient   RxID:   [id number] CYMBALTA 60 MG CPEP (DULOXETINE HCL) 1po once daily  #90 x 3   Entered and Authorized by:   Corwin Levins MD   Signed by:   Corwin Levins MD on 12/08/2010   Method used:   Print then Give to Patient   RxID:   [id number]    Orders Added: 1)  Est. Patient Level IV [42595]

## 2011-01-25 NOTE — Progress Notes (Signed)
Phone Note Call from Patient   Caller: Patient Summary of Call: Patient walked in with medication request after reviewing updated Med. list to meds. at home as Dr. Jonny Ruiz instructed at last appt.. Pt. Needs refills on Lisinopril, Metoprolol, Simvastatin, glimepiride and metformin to be printed and pt. will pickup to send to [place]. Patient request also that Uroxatral, Tricor, Actos and Flexeril he no longer takes and would like to be removed from med. list. Also, [facility] told him not to take Metformin as he is no longer diabetic, please advise. Initial call taken by: Scharlene Gloss,  June 06, 2010 11:04 AM  Follow-up for Phone Call        I believe I gave the patient a copy of his lab results at last visit  please refer to the elevated HGBA1c 8.0% which proves he is very much diabetic and needs his meds;  if he was told this at the [place]  - this was clearly an ERROR and I ask the patient to review his labs with the MD at the [facility] at the time of his next visit, as well as continue the metform  please ask pt to return in 6 wks to repeat his lab work for DM if he has any question of need for mes refills of all meds/med list update to robin to handle  robin to also inform pt - his VIT D level is low - needs to take 2000 units per day (OTC) Follow-up by: Corwin Levins MD,  June 06, 2010 7:46 PM  Additional Follow-up for Phone Call Additional follow up Details #1::        Informed pt of all above information.Pt is to pickup printed prescriptions this afternoon. Pt agreed to take Vit. D 2000 units per day. Also pt will return on July 19, 2010 for labs, what labs do you want me to schedule for pt.? Additional Follow-up by: Scharlene Gloss,  June 07, 2010 8:59 AM    Additional Follow-up for Phone Call Additional follow up Details #2::    ok for:     bmet, hgba1c, lipids - 250.02 Follow-up by: Corwin Levins MD,  June 07, 2010 12:05 PM  Prescriptions: METFORMIN HCL 500 MG TABS (METFORMIN HCL) 2 by mouth once daily   #180 x 3   Entered by:   Scharlene Gloss   Authorized by:   Corwin Levins MD   Signed by:   Scharlene Gloss on 06/07/2010   Method used:   Print then Give to Patient   RxID:   [id number] GLIMEPIRIDE 4 MG TABS (GLIMEPIRIDE) 1 by mouth once daily  #90 Each x 3   Entered by:   Scharlene Gloss   Authorized by:   Corwin Levins MD   Signed by:   Scharlene Gloss on 06/07/2010   Method used:   Print then Give to Patient   RxID:   [id number] SIMVASTATIN 80 MG TABS (SIMVASTATIN) 1 by mouth once daily  #90 x 3   Entered by:   Scharlene Gloss   Authorized by:   Corwin Levins MD   Signed by:   Scharlene Gloss on 06/07/2010   Method used:   Print then Give to Patient   RxID:   [id number] METOPROLOL SUCCINATE 50 MG  TB24 (METOPROLOL SUCCINATE) 1po qd  #90 x 3   Entered by:   Scharlene Gloss   Authorized by:   Corwin Levins MD   Signed by:   Scharlene Gloss on  06/07/2010   Method used:   Print then Give to Patient   RxID:   [id number] LISINOPRIL 40 MG TABS (LISINOPRIL) Take 1 tablet by mouth once a day  #90 x 3   Entered by:   Scharlene Gloss   Authorized by:   Corwin Levins MD   Signed by:   Scharlene Gloss on 06/07/2010   Method used:   Print then Give to Patient   RxID:   [id number]

## 2011-01-25 NOTE — Letter (Signed)
[facility]   Imported By: Sherian Rein 11/06/2010 07:31:47  _____________________________________________________________________  External Attachment:    Type:   Image     Comment:   External Document

## 2011-01-25 NOTE — Progress Notes (Signed)
Summary: pt states does not use [facility]   Phone Note Call from Patient   Caller: Patient [phone] Reason for Call: Talk to Nurse Summary of Call: returned carol's call from yesterday Initial call taken by: Glynda Jaeger,  November 03, 2010 8:26 AM

## 2011-01-25 NOTE — Assessment & Plan Note (Signed)
Summary: F1Y/ANAS  Medications Added LIPITOR 40 MG TABS (ATORVASTATIN CALCIUM) one by mouth daily      Allergies Added: NKDA  Visit Type:  1 yr f/u Primary Provider:  Oliver Barre MD   History of Present Illness: Zachary Harris returns today for further evaluation and management his coronary disease, history of MI, nonobstructive carotid disease, and hyperlipidemia.  He is having no angina or ischemic symptoms. Recent lab work was reviewed and his numbers are at goal except for a low HDL. His hemoglobin A1c was elevated at 8%.  He is on simvastatin 80. Changes were not made.  He's having no symptoms of TIAs or mini strokes. Carotids were stable in October of 2010.  Current Medications (verified): 1)  Lisinopril 40 Mg Tabs (Lisinopril) .... Take 1 Tablet By Mouth Once A Day 2)  Metoprolol Succinate 50 Mg  Tb24 (Metoprolol Succinate) .Marland Kitchen.. 1po Qd 3)  Aspirin Ec 325 Mg Tbec (Aspirin) .... Take One Tablet By Mouth Daily 4)  Simvastatin 80 Mg Tabs (Simvastatin) .Marland Kitchen.. 1 By Mouth Once Daily 5)  Uroxatral 10 Mg Tb24 (Alfuzosin Hcl) .... Take 1 Tablet By Mouth Once A Day 6)  Detrol La 4 Mg Xr24h-Cap (Tolterodine Tartrate) .Marland Kitchen.. 1 By Mouth Once Daily 7)  Tricor 145 Mg  Tabs (Fenofibrate) .Marland Kitchen.. 1 By Mouth Qd 8)  Glimepiride 4 Mg Tabs (Glimepiride) .Marland Kitchen.. 1 By Mouth Once Daily 9)  Onetouch Ultra Test  Strp (Glucose Blood) .... Use Asd 1 Once Daily 10)  Onetouch Lancets  Misc (Lancets) .... Use Asd 1 Once Daily 11)  Flexeril 5 Mg Tabs (Cyclobenzaprine Hcl) .Marland Kitchen.. 1po Three Times A Day As Needed Pain 12)  Actos 30 Mg Tabs (Pioglitazone Hcl) .Marland Kitchen.. 1 By Mouth Once Daily 13)  Metformin Hcl 500 Mg Tabs (Metformin Hcl) .... 2 By Mouth in The Am, and 1 By Mouth in The Pm 14)  Alendronate Sodium 70 Mg Tabs (Alendronate Sodium) .Marland Kitchen.. 1 By Mouth Q Wk 15)  Hydrocodone-Homatropine 5-1.5 Mg/71ml Syrp (Hydrocodone-Homatropine) .Marland Kitchen.. 1 Tsp By Mouth Q 6 Hrs As Needed Cough  Allergies (verified): No Known Drug  Allergies  Past History:  Past Medical History: Last updated: 06/02/2010 MYOCARDIAL INFARCTION, HX OF (ICD-412) 8/07 CORONARY ARTERY DISEASE (ICD-414.00) PERIPHERAL VASCULAR DISEASE (ICD-443.9) HYPERLIPIDEMIA (ICD-272.4) HYPERTENSION (ICD-401.9) SYNCOPE (ICD-780.2) RENAL INSUFFICIENCY (ICD-588.9) GERD (ICD-530.81) DIABETES MELLITUS, TYPE II (ICD-250.00) ANXIETY (ICD-300.00) DEPRESSION (ICD-311) BENIGN PROSTATIC HYPERTROPHY (ICD-600.00) UNSPECIFIED HEARING LOSS (ICD-389.9) GROIN PAIN (ICD-789.09) MEMORY LOSS (ICD-780.93) PREVENTIVE HEALTH CARE (ICD-V70.0) CELLULITIS, FACE (ICD-682.0) RASH-NONVESICULAR (ICD-782.1) FAMILY HISTORY DIABETES 1ST DEGREE RELATIVE (ICD-V18.0) ANEMIA-IRON DEFICIENCY (ICD-280.9) DIVERTICULITIS, HX OF (ICD-V12.79) COLONIC POLYPS, HX OF (ICD-V12.72) pelvic fx 3/07  Osteoporosis  Past Surgical History: Last updated: 07/01/2009 Coronary artery bypass graft s/p coronary stent x 1 Cholecystectomy s/p bilat knee replacement 1992  Family History: Last updated: 11/14/2007 Family History Diabetes 1st degree relative - father in his 24's  Social History: Last updated: 07/01/2009 Former Smoker Alcohol use-no 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 No masses, thyromegaly or abnormal cervical nodes. Chest Zachary Harris:  no deformities or breast masses noted Lungs:  Clear bilaterally to  auscultation and percussion. Heart:  PMI nondisplaced, soft S1-S2, regular rate and rhythm Msk:  decreased ROM.   Pulses:  pulses normal in all 4 extremities Extremities:  No clubbing or cyanosis. Neurologic:  Alert and oriented x 3. Skin:  Intact without lesions or rashes. Psych:  Normal affect.   EKG  Procedure date:  07/19/2010  Findings:      sinus bradycardia, no significant change  Impression & Recommendations:  Problem # 1:  CORONARY ARTERY DISEASE (ICD-414.00) Assessment Unchanged Continue medical therapy His updated medication list for this problem includes:    Lisinopril 40 Mg Tabs (Lisinopril) .Marland Kitchen... Take 1 tablet by mouth once a day    Metoprolol Succinate 50 Mg Tb24 (Metoprolol succinate) .Marland Kitchen... 1po qd    Aspirin Ec 325 Mg Tbec (Aspirin) .Marland Kitchen... Take one tablet by mouth daily  Problem # 2:  MYOCARDIAL INFARCTION, HX OF (ICD-412) Assessment: Unchanged  His updated medication list for this problem includes:    Lisinopril 40 Mg Tabs (Lisinopril) .Marland Kitchen... Take 1 tablet by mouth once a day    Metoprolol Succinate 50 Mg Tb24 (Metoprolol succinate) .Marland Kitchen... 1po qd    Aspirin Ec 325 Mg Tbec (Aspirin) .Marland Kitchen... Take one tablet by mouth daily  Problem # 3:  PERIPHERAL VASCULAR DISEASE (ICD-443.9) Assessment: Unchanged Carotid Dopplers reviewed from October. Asymptomatic. No change in treatment.  Problem # 4:  HYPERLIPIDEMIA (ICD-272.4) We will change his high-dose simvastatin 2 Lipitor 40 mg. Followup labs in 6-8 weeks. His updated medication list for this problem includes:    Lipitor 40 Mg Tabs (Atorvastatin calcium) ..... One by mouth daily    Tricor 145 Mg Tabs (Fenofibrate) .Marland Kitchen... 1 by mouth qd  Patient Instructions: 1)  Your physician recommends that you schedule a follow-up appointment in: 6 months with Dr  Daleen Squibb 2)  Your physician has recommended you make the following change in your medication: stop Simvastatin and start Lipitor 40mg  Prescriptions: LIPITOR 40 MG TABS  (ATORVASTATIN CALCIUM) one by mouth daily  #30 x 11   Entered by:   Dennis Bast, RN, BSN   Authorized by:   Gaylord Shih, MD, [facility]   Signed by:   Dennis Bast, RN, BSN on 07/19/2010   Method used:   Electronically to        [facility]* (retail)       [address]       [place], [place]  [id number]       Ph: [phone]       Fax: [phone]   RxID:   [id number]

## 2011-01-25 NOTE — Assessment & Plan Note (Signed)
Summary: pv eval pvd/pt had lower arterial duplex    Visit Type:  Follow-up Primary Provider:  Oliver Barre MD  CC:  bilateral foot pain.  History of Present Illness: 77 yo WM with history DM, HTN, hyperlipidemia and CAD s/p CABG who is here today for PV evaluation. His cardiac issues are followed by Dr. Daleen Squibb. He has had recent complaints of burning on the bottoms of his feet with pain when he stands and puts pressure on his feet. He describes no exertional leg pain. He has no pain in his buttocks, thighs or calf muscles with ambulation. There are no ulcerations on his ankles, feet or toes. He has recently been treated with a cream by a dermatologist  for venous stasis changes in his right leg. He gets around well. No pain in the ankle or toe joints.   Current Medications (verified): 1)  Lisinopril 40 Mg Tabs (Lisinopril) .... Take 1 Tablet By Mouth Once A Day 2)  Metoprolol Succinate 50 Mg  Tb24 (Metoprolol Succinate) .Marland Kitchen.. 1po Qd 3)  Aspirin Ec 325 Mg Tbec (Aspirin) .... Take One Tablet By Mouth Daily 4)  Lipitor 40 Mg Tabs (Atorvastatin Calcium) .... One By Mouth Daily 5)  Uroxatral 10 Mg Tb24 (Alfuzosin Hcl) .... Take 1 Tablet By Mouth Once A Day 6)  Detrol La 4 Mg Xr24h-Cap (Tolterodine Tartrate) .Marland Kitchen.. 1 By Mouth Once Daily 7)  Tricor 145 Mg  Tabs (Fenofibrate) .Marland Kitchen.. 1 By Mouth Qd 8)  Glimepiride 4 Mg Tabs (Glimepiride) .Marland Kitchen.. 1 By Mouth Once Daily 9)  Onetouch Ultra Test  Strp (Glucose Blood) .... Use Asd 1 Once Daily 10)  Onetouch Lancets  Misc (Lancets) .... Use Asd 1 Once Daily 11)  Flexeril 5 Mg Tabs (Cyclobenzaprine Hcl) .Marland Kitchen.. 1po Three Times A Day As Needed Pain 12)  Actos 30 Mg Tabs (Pioglitazone Hcl) .Marland Kitchen.. 1 By Mouth Once Daily 13)  Metformin Hcl 500 Mg Tabs (Metformin Hcl) .... 2 By Mouth in The Am, and 1 By Mouth in The Pm 14)  Alendronate Sodium 70 Mg Tabs (Alendronate Sodium) .Marland Kitchen.. 1 By Mouth Q Wk 15)  Hydrocodone-Homatropine 5-1.5 Mg/7ml Syrp (Hydrocodone-Homatropine) .Marland Kitchen.. 1 Tsp  By Mouth Q 6 Hrs As Needed Cough  Allergies: No Known Drug Allergies  Past History:  Past Medical History: MYOCARDIAL INFARCTION, HX OF (ICD-412) 8/07 CORONARY ARTERY DISEASE (ICD-414.00) s/p CABG PERIPHERAL VASCULAR DISEASE (ICD-443.9) HYPERLIPIDEMIA (ICD-272.4) HYPERTENSION (ICD-401.9) SYNCOPE (ICD-780.2) RENAL INSUFFICIENCY (ICD-588.9) GERD (ICD-530.81) DIABETES MELLITUS, TYPE II (ICD-250.00) ANXIETY (ICD-300.00) DEPRESSION (ICD-311) BENIGN PROSTATIC HYPERTROPHY (ICD-600.00) UNSPECIFIED HEARING LOSS (ICD-389.9) GROIN PAIN (ICD-789.09) MEMORY LOSS (ICD-780.93) PREVENTIVE HEALTH CARE (ICD-V70.0) CELLULITIS, FACE (ICD-682.0) RASH-NONVESICULAR (ICD-782.1) FAMILY HISTORY DIABETES 1ST DEGREE RELATIVE (ICD-V18.0) ANEMIA-IRON DEFICIENCY (ICD-280.9) DIVERTICULITIS, HX OF (ICD-V12.79) COLONIC POLYPS, HX OF (ICD-V12.72) pelvic fx 3/07  Osteoporosis  Past Surgical History: Reviewed history from 07/01/2009 and no changes required. Coronary artery bypass graft s/p coronary stent x 1 Cholecystectomy s/p bilat knee replacement 1992  Family History: Reviewed history from 11/14/2007 and no changes required. Family History Diabetes 1st degree relative - father in his 47's  Social History: Reviewed history from 07/01/2009 and no changes required. 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         See HPI  Vital  Signs:  Patient profile:   77 year old male Height:      69.5 inches Weight:      200 pounds Pulse rate:   58 / minute BP sitting:   142 / 72  (left arm)  Vitals Entered By: Jacquelin Hawking, CMA (November 09, 2010 2:45 PM)  Serial Vital Signs/Assessments:  Comments: 140/80  right arm  w/a r/n By: Jacquelin Hawking, CMA    Physical Exam  General:  General: Elderly, Well developed, well nourished, NAD HEENT: OP clear, mucus membranes moist SKIN: warm, dry Neuro: No focal deficits Musculoskeletal: Muscle strength 5/5 all ext Psychiatric: Mood and affect normal Neck: No JVD, no carotid bruits, no thyromegaly, no lymphadenopathy. Lungs:Clear bilaterally, no wheezes, rhonci, crackles CV: RRR no murmurs, gallops rubs Abdomen: soft, NT, ND, BS present Extremities: Trace bilateral lower ext edema. Venous stasis changes over right ankle. Non-palpable pulses bilateral DP/PT. Good capillary refill all toes. No ulcerations. Sensation intact both feet.     Arterial Doppler  Procedure date:  11/07/2010  Findings:      No significant focal stenoses bilaterally.  Right ABI 1.2 (may be falsely elevated secondary to medail calcification).  Left ABI 0.69.  Bilateral toe brachial indices are abnormal.  (0.50 on the right and 0.32 on the left) The left popliteal artery waveform is monophasic.  The left great toe waveform is most damped. The dicrotic notch is absent from all lower digit PPG waveforms. PPG waveforms of 2nd, 3rd, 4th and 5th digits are damped and broadened.    Impression & Recommendations:  Problem # 1:  PERIPHERAL VASCULAR DISEASE (ICD-443.9) Mr. Whaling presents with pain on the plantar surfaces of both feet with equal burning in the feet when standing. There are no claudication type symptoms. His feet are warm with good capillary refill in all toes. He clearly has PAD but I do not think his PAD is responsible for the current symptoms. His symptoms seem most c/w neurophathy.  Given his advanced age,  I would not recommend invasive testing at this point. Will treat conservatively for now. Repeat arterial dopplers in 6 months. He will call if there are any changes in his clinical status.   Patient Instructions: 1)  Your physician recommends that you schedule a follow-up appointment in: 6 months 2)  Your physician recommends that you continue on your current medications as directed. Please refer to the Current Medication list given to you today. 3)  Your physician has requested that you have a lower extremity arterial duplex in 6 months.  This test is an ultrasound of the arteries in the legs.  It looks at arterial blood flow in the legs and arms.  Allow one hour for Lower and Upper Arterial scans. There are no restrictions or special instructions.

## 2011-01-25 NOTE — Letter (Signed)
[facility]: [facility]   Imported By: Lester Wann 12/27/2009 07:24:13  _____________________________________________________________________  External Attachment:    Type:   Image     Comment:   External Document

## 2011-01-25 NOTE — Miscellaneous (Signed)
Summary: Orders Update  Clinical Lists Changes  Problems: Added new problem of SECONDARY DM W/PERIPHERAL CIRC D/O UNCONTROLLED (ICD-249.71) Orders: Added new Test order of Arterial Duplex Lower Extremity (Arterial Duplex Low) - Signed

## 2011-01-25 NOTE — Progress Notes (Signed)
Summary: CALL  Phone Note Call from Patient Call back at [phone]   Summary of Call: Pt left vm req a call back regarding a medication.  Initial call taken by: Lamar Sprinkles, CMA,  January 18, 2011 12:05 PM  Follow-up for Phone Call        Called patient back and he needed clarifiction on his change in meds. on Jan. 23, 2012. Informed the patient of information in phone note on Jan. 23, 2012 pertaining to decreasing the Actos to 15mg  per days and to change Metformin 500mg  2 by mouth two times a day. Follow-up by: Zella Ball Ewing CMA Duncan Dull),  January 19, 2011 8:36 AM     Appended Document: CALL noted

## 2011-01-25 NOTE — Letter (Signed)
Summary: [facility]   Imported By: Sherian Rein 04/13/2010 10:54:07  _____________________________________________________________________  External Attachment:    Type:   Image     Comment:   External Document

## 2011-01-25 NOTE — Progress Notes (Signed)
  Phone Note Refill Request Message from:  Fax from Pharmacy on November 27, 2010 11:52 AM  Refills Requested: Medication #1:  DETROL LA 4 MG XR24H-CAP 1 by mouth once daily   Dosage confirmed as above?Dosage Confirmed   Last Refilled: 11/11/2009   Notes: [facility] Initial call taken by: Zella Ball Ewing CMA Duncan Dull),  November 27, 2010 11:53 AM    Prescriptions: DETROL LA 4 MG XR24H-CAP (TOLTERODINE TARTRATE) 1 by mouth once daily  #90 x 1   Entered by:   Scharlene Gloss CMA (AAMA)   Authorized by:   Corwin Levins MD   Signed by:   Scharlene Gloss CMA (AAMA) on 11/27/2010   Method used:   Faxed to ...       [facility]* (retail)       [address]       [place], [place]  [id number]       Ph: [phone]       Fax: [phone]   RxID:   [id number]

## 2011-01-25 NOTE — Miscellaneous (Signed)
Summary: Orders Update  Clinical Lists Changes  Problems: Added new problem of CAROTID ARTERY DISEASE (ICD-433.10) Orders: Added new Test order of Carotid Duplex (Carotid Duplex) - Signed Added new Test order of Carotid Duplex (Carotid Duplex) - Signed

## 2011-01-25 NOTE — Progress Notes (Signed)
  Phone Note Refill Request Message from:  Fax from Pharmacy on January 15, 2011 11:22 AM  Refills Requested: Medication #1:  UROXATRAL 10 MG XR24H-TAB 1 tab once daily.   Dosage confirmed as above?Dosage Confirmed   Notes: [facility] Initial call taken by: Robin Ewing CMA (AAMA),  January 15, 2011 11:22 AM    Prescriptions: UROXATRAL 10 MG XR24H-TAB (ALFUZOSIN HCL) 1 tab once daily  #90 x 3   Entered by:   Zella Ball Ewing CMA (AAMA)   Authorized by:   Corwin Levins MD   Signed by:   Scharlene Gloss CMA (AAMA) on 01/15/2011   Method used:   Faxed to ...       [facility] (mail-order)             , [place]         Ph: [phone]       Fax: [phone]   RxID:   [id number]

## 2011-01-25 NOTE — Assessment & Plan Note (Signed)
Summary: 6 month rov sl  Medications Added UROXATRAL 10 MG XR24H-TAB (ALFUZOSIN HCL) 1 tab once daily      Allergies Added: NKDA  Visit Type:  6 MO  Primary Provider:  Oliver Barre MD  CC:  edema/ankles...denies any other complaints today.  History of Present Illness: Mr. Liburd returns today for evaluation management of his coronary artery disease and vascular disease.  His biggest complaint today is increased lower extremity edema. He denies orthopnea or PND. He's had no angina. He  is on Actos  Peripheral Dopplers in November showed an ABI of 0.6 the left and normal the right. Left toe brachial reflux was borderline for tissue loss risk .  He denies any claudication. He's had no unhealed areas.  His hemoglobin A1c was 7% in December. Cholesterol is 139 triglycerides 174 and she'll 27.2 LDL 77.  Current Medications (verified): 1)  Lisinopril 40 Mg Tabs (Lisinopril) .... Take 1 Tablet By Mouth Once A Day 2)  Metoprolol Succinate 50 Mg  Tb24 (Metoprolol Succinate) .Marland Kitchen.. 1po Once Daily 3)  Aspirin Ec 325 Mg Tbec (Aspirin) .... Take One Tablet By Mouth Daily 4)  Lipitor 40 Mg Tabs (Atorvastatin Calcium) .... One By Mouth Daily 5)  Detrol La 4 Mg Xr24h-Cap (Tolterodine Tartrate) .Marland Kitchen.. 1 By Mouth Once Daily 6)  Fenofibrate 160 Mg Tabs (Fenofibrate) .Marland Kitchen.. 1po Once Daily 7)  Glimepiride 4 Mg Tabs (Glimepiride) .Marland Kitchen.. 1 By Mouth Once Daily 8)  Onetouch Ultra Test  Strp (Glucose Blood) .... Use Asd 1 Once Daily 9)  Onetouch Lancets  Misc (Lancets) .... Use Asd 1 Once Daily 10)  Actos 30 Mg Tabs (Pioglitazone Hcl) .Marland Kitchen.. 1 By Mouth Once Daily 11)  Metformin Hcl 500 Mg Tabs (Metformin Hcl) .... 2 By Mouth in The Am, and 1 By Mouth in The Pm 12)  Alendronate Sodium 70 Mg Tabs (Alendronate Sodium) .Marland Kitchen.. 1 By Mouth Q Wk 13)  Uroxatral 10 Mg Xr24h-Tab (Alfuzosin Hcl) .Marland Kitchen.. 1 Tab Once Daily  Allergies (verified): No Known Drug Allergies  Past History:  Past Medical History: Last updated:  11/09/2010 MYOCARDIAL INFARCTION, HX OF (ICD-412) 8/07 CORONARY ARTERY DISEASE (ICD-414.00) s/p CABG PERIPHERAL VASCULAR DISEASE (ICD-443.9) HYPERLIPIDEMIA (ICD-272.4) HYPERTENSION (ICD-401.9) SYNCOPE (ICD-780.2) RENAL INSUFFICIENCY (ICD-588.9) GERD (ICD-530.81) DIABETES MELLITUS, TYPE II (ICD-250.00) ANXIETY (ICD-300.00) DEPRESSION (ICD-311) BENIGN PROSTATIC HYPERTROPHY (ICD-600.00) UNSPECIFIED HEARING LOSS (ICD-389.9) GROIN PAIN (ICD-789.09) MEMORY LOSS (ICD-780.93) PREVENTIVE HEALTH CARE (ICD-V70.0) CELLULITIS, FACE (ICD-682.0) RASH-NONVESICULAR (ICD-782.1) FAMILY HISTORY DIABETES 1ST DEGREE RELATIVE (ICD-V18.0) ANEMIA-IRON DEFICIENCY (ICD-280.9) DIVERTICULITIS, HX OF (ICD-V12.79) COLONIC POLYPS, HX OF (ICD-V12.72) pelvic fx 3/07  Osteoporosis  Past Surgical History: Last updated: 07/01/2009 Coronary artery bypass graft s/p coronary stent x 1 Cholecystectomy s/p bilat knee replacement 1992  Family History: Last updated: 11/14/2007 Family History Diabetes 1st degree relative - father in his 7's  Social History: Last updated: 12/08/2010 Former Smoker Alcohol use-no 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 Head:  normocephalic and atraumatic Eyes:  glasses otherwise normal Neck:  Neck supple, no JVD. No masses, thyromegaly or abnormal cervical nodes. Chest Delontae Lamm:  no deformities or breast masses noted Lungs:  Clear bilaterally to  auscultation and percussion. Heart:  PMI nondisplaced normal S1-S2, regular rate and rhythm, no obvious carotid bruits Msk:  decreased ROM.   Pulses:  barely palpable dorsalis pedis on the left lower showed a Extremities:  1-2+ pitting edema above the sock line, varicose veins but no sign of DVT Neurologic:  Alert and oriented x 3. Skin:  Intact without lesions or rashes. Psych:  Normal affect.   Problems:  Medical Problems Added: 1)  Dx of Edema  (ICD-782.3)  Impression & Recommendations:  Problem # 1:  CORONARY ARTERY DISEASE (ICD-414.00) Assessment Unchanged  His updated medication list for this problem includes:    Lisinopril 40 Mg Tabs (Lisinopril) .Marland Kitchen... Take 1 tablet by mouth once a day    Metoprolol Succinate 50 Mg Tb24 (Metoprolol succinate) .Marland Kitchen... 1po once daily    Aspirin Ec 325 Mg Tbec (Aspirin) .Marland Kitchen... Take one tablet by mouth daily  Problem # 2:  CAROTID ARTERY DISEASE (ICD-433.10) Assessment: Unchanged followup carotid Dopplers in October. His updated medication list for this problem includes:    Aspirin Ec 325 Mg Tbec (Aspirin) .Marland Kitchen... Take one tablet by mouth daily  Problem # 3:  MYOCARDIAL INFARCTION, HX OF (ICD-412) Assessment: Unchanged  His updated medication list for this problem includes:    Lisinopril 40 Mg Tabs (Lisinopril) .Marland Kitchen... Take 1 tablet by mouth once a day    Metoprolol Succinate 50 Mg Tb24 (Metoprolol succinate) .Marland Kitchen... 1po once daily    Aspirin Ec 325 Mg Tbec (Aspirin) .Marland Kitchen... Take one tablet by mouth daily  Problem # 4:  PERIPHERAL VASCULAR DISEASE (ICD-443.9) Assessment: Deteriorated I  reviewed his daughter is with him. Is not symptomatic. Foot care emphasized.  Problem # 5:  HYPERTENSION (ICD-401.9) Assessment: Improved  His updated medication list for this problem includes:    Lisinopril 40 Mg Tabs (Lisinopril) .Marland Kitchen... Take 1 tablet by mouth once a day    Metoprolol Succinate 50 Mg Tb24 (Metoprolol succinate) .Marland Kitchen... 1po once daily    Aspirin  Ec 325 Mg Tbec (Aspirin) .Marland Kitchen... Take one tablet by mouth daily  Problem # 6:  EDEMA (ICD-782.3) Assessment: Deteriorated I have sent Dr. Jonny Ruiz a note to stop his Actos and increase his metformin. Will leave this to his expertise. His edema seems to bother him.  Patient Instructions: 1)  Your physician recommends that you schedule a follow-up appointment in: October 2012 with Carotid Doppler on the same day 2)  Your physician recommends that you continue on your current medications as directed. Please refer to the Current Medication list given to you today. 3)  Your physician has requested that you have a carotid duplex. This test is an ultrasound of the carotid arteries in your neck. It looks at blood flow through these arteries that supply the brain with blood. Allow one hour for this exam. There are no restrictions or special instructions. To be done in October

## 2011-01-25 NOTE — Progress Notes (Signed)
----   Converted from flag ---- ---- 01/15/2011 1:13 PM, Corwin Levins MD wrote: sounds good  ok to increase the metformin to 500 - 2 by mouth  two times a day, and decrease the actos to 15 mg, and notify pt, and make this note a saved note  ---- 01/15/2011 9:38 AM, Dossie Arbour, RN, BSN wrote: Pt. saw Dr. Daleen Squibb today and had edema in his legs.  Dr. Daleen Squibb asked me to send you a flag to consider stopping Actos and increasing Metformin due to the edema. Thanks ------------------------------  called pt. informed of medication change and sent prescriptions to [facility].      New/Updated Medications: METFORMIN HCL 500 MG TABS (METFORMIN HCL) 2 by mouth two times a day ACTOS 15 MG TABS (PIOGLITAZONE HCL) 1 by mouth once daily Prescriptions: ACTOS 15 MG TABS (PIOGLITAZONE HCL) 1 by mouth once daily  #30 x 11   Entered by:   Zella Ball Ewing CMA (AAMA)   Authorized by:   Corwin Levins MD   Signed by:   Scharlene Gloss CMA (AAMA) on 01/15/2011   Method used:   Faxed to ...       [facility]* (retail)       [address]       [place], [place]  [id number]       Ph: [phone]       Fax: [phone]   RxID:   [id number] METFORMIN HCL 500 MG TABS (METFORMIN HCL) 2 by mouth two times a day  #120 x 11   Entered by:   Zella Ball Ewing CMA (AAMA)   Authorized by:   Corwin Levins MD   Signed by:   Scharlene Gloss CMA (AAMA) on 01/15/2011   Method used:   Faxed to ...       [facility]* (retail)       [address]       [place], [place]  [id number]       Ph: [phone]       Fax: [phone]   RxID:   [id number]

## 2011-01-25 NOTE — Assessment & Plan Note (Signed)
Summary: cold symptoms,med refill-oyu   Vital Signs:  Patient profile:   77 year old male Height:      69.5 inches Weight:      201.50 pounds BMI:     29.44 O2 Sat:      94 % on Room air Temp:     98.2 degrees F oral Pulse rate:   57 / minute BP sitting:   140 / 60  (left arm) Cuff size:   large  Vitals Entered ByMarland Kitchen Zella Ball Ewing (June 02, 2010 4:10 PM)  O2 Flow:  Room air  CC: Sore throat, refills/RE   Primary Care Provider:  Oliver Barre MD  CC:  Sore throat and refills/RE.  History of Present Illness: here with acute onset x 3 days mild to mod ST, with fever, mild weakness, general malaise, and now prod cough wtih greenish sputum; but Pt denies CP, sob, doe, wheezing, orthopnea, pnd, worsening LE edema, palps, dizziness or syncope   Pt denies new neuro symptoms such as headache, facial or extremity weakness   Pt denies polydipsia, polyuria, or low sugar symptoms such as shakiness improved with eating.  Overall good compliance with meds, trying to follow low chol, DM diet, wt stable, little excercise however   Pt would like to revew recent dxa as well.    Problems Prior to Update: 1)  Osteoporosis  (ICD-733.00) 2)  Bronchitis-acute  (ICD-466.0) 3)  Vertebral Fracture  (ICD-805.8) 4)  Back Pain  (ICD-724.5) 5)  Myocardial Infarction, Hx of  (ICD-412) 6)  Coronary Artery Disease  (ICD-414.00) 7)  Peripheral Vascular Disease  (ICD-443.9) 8)  Hyperlipidemia  (ICD-272.4) 9)  Hypertension  (ICD-401.9) 10)  Syncope  (ICD-780.2) 11)  Renal Insufficiency  (ICD-588.9) 12)  Gerd  (ICD-530.81) 13)  Diabetes Mellitus, Type II  (ICD-250.00) 14)  Anxiety  (ICD-300.00) 15)  Depression  (ICD-311) 16)  Benign Prostatic Hypertrophy  (ICD-600.00) 17)  Unspecified Hearing Loss  (ICD-389.9) 18)  Groin Pain  (ICD-789.09) 19)  Memory Loss  (ICD-780.93) 20)  Preventive Health Care  (ICD-V70.0) 21)  Cellulitis, Face  (ICD-682.0) 22)  Rash-nonvesicular  (ICD-782.1) 23)  Family History Diabetes  1st Degree Relative  (ICD-V18.0) 24)  Anemia-iron Deficiency  (ICD-280.9) 25)  Diverticulitis, Hx of  (ICD-V12.79) 26)  Colonic Polyps, Hx of  (ICD-V12.72)  Medications Prior to Update: 1)  Lisinopril 40 Mg Tabs (Lisinopril) .... Take 1 Tablet By Mouth Once A Day 2)  Metoprolol Succinate 50 Mg  Tb24 (Metoprolol Succinate) .Marland Kitchen.. 1po Qd 3)  Aspirin Ec 325 Mg Tbec (Aspirin) .... Take One Tablet By Mouth Daily 4)  Simvastatin 80 Mg Tabs (Simvastatin) .Marland Kitchen.. 1 By Mouth Once Daily 5)  Uroxatral 10 Mg Tb24 (Alfuzosin Hcl) .... Take 1 Tablet By Mouth Once A Day 6)  Detrol La 4 Mg Xr24h-Cap (Tolterodine Tartrate) .Marland Kitchen.. 1 By Mouth Once Daily 7)  Tricor 145 Mg  Tabs (Fenofibrate) .Marland Kitchen.. 1 By Mouth Qd 8)  Glimepiride 4 Mg Tabs (Glimepiride) .Marland Kitchen.. 1 By Mouth Once Daily 9)  Onetouch Ultra Test  Strp (Glucose Blood) .... Use Asd 1 Once Daily 10)  Onetouch Lancets  Misc (Lancets) .... Use Asd 1 Once Daily 11)  Darvocet-N 100 100-650 Mg Tabs (Propoxyphene N-Apap) .Marland Kitchen.. 1 By Mouth Qid As Needed Pain 12)  Flexeril 5 Mg Tabs (Cyclobenzaprine Hcl) .Marland Kitchen.. 1po Three Times A Day As Needed Pain 13)  Actos 30 Mg Tabs (Pioglitazone Hcl) .Marland Kitchen.. 1 By Mouth Once Daily 14)  Metformin Hcl 500 Mg Tabs (Metformin Hcl) .Marland KitchenMarland KitchenMarland Kitchen  1 Qam 15)  Alendronate Sodium 70 Mg Tabs (Alendronate Sodium) .Marland Kitchen.. 1 By Mouth Q Wk  Current Medications (verified): 1)  Lisinopril 40 Mg Tabs (Lisinopril) .... Take 1 Tablet By Mouth Once A Day 2)  Metoprolol Succinate 50 Mg  Tb24 (Metoprolol Succinate) .Marland Kitchen.. 1po Qd 3)  Aspirin Ec 325 Mg Tbec (Aspirin) .... Take One Tablet By Mouth Daily 4)  Simvastatin 80 Mg Tabs (Simvastatin) .Marland Kitchen.. 1 By Mouth Once Daily 5)  Uroxatral 10 Mg Tb24 (Alfuzosin Hcl) .... Take 1 Tablet By Mouth Once A Day 6)  Detrol La 4 Mg Xr24h-Cap (Tolterodine Tartrate) .Marland Kitchen.. 1 By Mouth Once Daily 7)  Tricor 145 Mg  Tabs (Fenofibrate) .Marland Kitchen.. 1 By Mouth Qd 8)  Glimepiride 4 Mg Tabs (Glimepiride) .Marland Kitchen.. 1 By Mouth Once Daily 9)  Onetouch Ultra Test  Strp  (Glucose Blood) .... Use Asd 1 Once Daily 10)  Onetouch Lancets  Misc (Lancets) .... Use Asd 1 Once Daily 11)  Darvocet-N 100 100-650 Mg Tabs (Propoxyphene N-Apap) .Marland Kitchen.. 1 By Mouth Qid As Needed Pain 12)  Flexeril 5 Mg Tabs (Cyclobenzaprine Hcl) .Marland Kitchen.. 1po Three Times A Day As Needed Pain 13)  Actos 30 Mg Tabs (Pioglitazone Hcl) .Marland Kitchen.. 1 By Mouth Once Daily 14)  Metformin Hcl 500 Mg Tabs (Metformin Hcl) .... 2 By Mouth Once Daily 15)  Alendronate Sodium 70 Mg Tabs (Alendronate Sodium) .Marland Kitchen.. 1 By Mouth Q Wk 16)  Cephalexin 500 Mg Caps (Cephalexin) .Marland Kitchen.. 1po Three Times A Day 17)  Hydrocodone-Homatropine 5-1.5 Mg/50ml Syrp (Hydrocodone-Homatropine) .Marland Kitchen.. 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 [employer] Married 2 children  Risk Factors: Smoking Status: quit (11/14/2007)  Past Medical History: MYOCARDIAL INFARCTION, HX OF (ICD-412) 8/07 CORONARY ARTERY DISEASE (ICD-414.00) PERIPHERAL VASCULAR DISEASE (ICD-443.9) HYPERLIPIDEMIA (ICD-272.4) HYPERTENSION (ICD-401.9) SYNCOPE (ICD-780.2) RENAL INSUFFICIENCY (ICD-588.9) GERD (ICD-530.81) DIABETES MELLITUS, TYPE II (ICD-250.00) ANXIETY (ICD-300.00) DEPRESSION (ICD-311) BENIGN PROSTATIC HYPERTROPHY (ICD-600.00) UNSPECIFIED HEARING LOSS (ICD-389.9) GROIN PAIN (ICD-789.09) MEMORY LOSS (ICD-780.93) PREVENTIVE HEALTH CARE (ICD-V70.0) CELLULITIS, FACE (ICD-682.0) RASH-NONVESICULAR (ICD-782.1) FAMILY HISTORY DIABETES 1ST DEGREE RELATIVE (ICD-V18.0) ANEMIA-IRON DEFICIENCY (ICD-280.9) DIVERTICULITIS, HX OF  (ICD-V12.79) COLONIC POLYPS, HX OF (ICD-V12.72) pelvic fx 3/07  Osteoporosis  Review of Systems  The patient denies anorexia, fever, weight loss, weight gain, vision loss, decreased hearing, hoarseness, chest pain, syncope, dyspnea on exertion, peripheral edema, prolonged cough, headaches, hemoptysis, abdominal pain, melena, hematochezia, severe indigestion/heartburn, hematuria, muscle weakness, suspicious skin lesions, transient blindness, difficulty walking, depression, unusual weight change, abnormal bleeding, enlarged lymph nodes, and angioedema.         all otherwise negative per pt -    Physical Exam  General:  alert and overweight-appearing.  , mild ill  Head:  normocephalic and atraumatic.   Eyes:  vision grossly intact, pupils equal, and pupils round.   Ears:  bilat tm's red, sinus nontender Nose:  nasal dischargemucosal pallor and mucosal edema.   Mouth:  pharyngeal erythema and fair dentition.   Neck:  supple and no masses.   Lungs:  normal respiratory effort, R decreased breath sounds, and L decreased breath sounds.  but no wheeze, rales Heart:  normal rate and  regular rhythm.   Abdomen:  soft, non-tender, and normal bowel sounds.   Msk:  no joint tenderness and no joint swelling.   Extremities:  no edema, no erythema  Neurologic:  alert & oriented X3 and cranial nerves II-XII intact.  though somewhat slowed in cognition Skin:  color normal and no rashes.   Psych:  not anxious appearing and not depressed appearing.     Impression & Recommendations:  Problem # 1:  Preventive Health Care (ICD-V70.0)  Overall doing well, age appropriate education and counseling updated and referral for appropriate preventive services done unless declined, immunizations up to date or declined, diet counseling done if overweight, urged to quit smoking if smokes , most recent labs reviewed and current ordered if appropriate, ecg reviewed or declined (interpretation per ECG scanned in the EMR  if done); information regarding Medicare Prevention requirements given if appropriate; speciality referrals updated as appropriate   Orders: TLB-BMP (Basic Metabolic Panel-BMET) (80048-METABOL) TLB-CBC Platelet - w/Differential (85025-CBCD) TLB-Hepatic/Liver Function Pnl (80076-HEPATIC) TLB-Lipid Panel (80061-LIPID) TLB-TSH (Thyroid Stimulating Hormone) (84443-TSH) TLB-Udip ONLY (81003-UDIP)  Problem # 2:  BRONCHITIS-ACUTE (ICD-466.0)  His updated medication list for this problem includes:    Cephalexin 500 Mg Caps (Cephalexin) .Marland Kitchen... 1po three times a day    Hydrocodone-homatropine 5-1.5 Mg/7ml Syrp (Hydrocodone-homatropine) .Marland Kitchen... 1 tsp by mouth q 6 hrs as needed cough treat as above, f/u any worsening signs or symptoms   Problem # 3:  DIABETES MELLITUS, TYPE II (ICD-250.00)  His updated medication list for this problem includes:    Lisinopril 40 Mg Tabs (Lisinopril) .Marland Kitchen... Take 1 tablet by mouth once a day    Aspirin Ec 325 Mg Tbec (Aspirin) .Marland Kitchen... Take one tablet by mouth daily    Glimepiride 4 Mg Tabs (Glimepiride) .Marland Kitchen... 1 by mouth once daily    Actos 30 Mg Tabs (Pioglitazone hcl) .Marland Kitchen... 1 by mouth once daily    Metformin Hcl 500 Mg Tabs (Metformin hcl) .Marland Kitchen... 2 by mouth once daily  Orders: TLB-A1C / Hgb A1C (Glycohemoglobin) (83036-A1C) TLB-Microalbumin/Creat Ratio, Urine (82043-MALB)  Labs Reviewed: Creat: 1.2 (09/05/2009)    Reviewed HgBA1c results: 9.3 (09/05/2009)  7.6 (03/31/2009) stable overall by hx and exam, ok to continue meds/tx as is, Pt to cont DM diet, excercise, wt loss efforts; to check labs today   Problem # 4:  OSTEOPOROSIS (ICD-733.00)  His updated medication list for this problem includes:    Alendronate Sodium 70 Mg Tabs (Alendronate sodium) .Marland Kitchen... 1 by mouth q wk treat as above, f/u any worsening signs or symptoms , for PTH level as well ; recent dxa reviewed with pt  Orders: T-Parathyroid Hormone, Intact w/ Calcium (91478-29562) T-Vitamin D  (25-Hydroxy) (13086-57846)  Complete Medication List: 1)  Lisinopril 40 Mg Tabs (Lisinopril) .... Take 1 tablet by mouth once a day 2)  Metoprolol Succinate 50 Mg Tb24 (Metoprolol succinate) .Marland Kitchen.. 1po qd 3)  Aspirin Ec 325 Mg Tbec (Aspirin) .... Take one tablet by mouth daily 4)  Simvastatin 80 Mg Tabs (Simvastatin) .Marland Kitchen.. 1 by mouth once daily 5)  Uroxatral 10 Mg Tb24 (Alfuzosin hcl) .... Take 1 tablet by mouth once a day 6)  Detrol La 4 Mg Xr24h-cap (Tolterodine tartrate) .Marland Kitchen.. 1 by mouth once daily 7)  Tricor 145 Mg Tabs (Fenofibrate) .Marland Kitchen.. 1 by mouth qd 8)  Glimepiride 4 Mg Tabs (Glimepiride) .Marland Kitchen.. 1 by mouth once daily 9)  Onetouch Ultra Test Strp (Glucose blood) .... Use asd 1 once daily 10)  Onetouch Lancets Misc (Lancets) .... Use  asd 1 once daily 11)  Darvocet-n 100 100-650 Mg Tabs (Propoxyphene n-apap) .Marland Kitchen.. 1 by mouth qid as needed pain 12)  Flexeril 5 Mg Tabs (Cyclobenzaprine hcl) .Marland Kitchen.. 1po three times a day as needed pain 13)  Actos 30 Mg Tabs (Pioglitazone hcl) .Marland Kitchen.. 1 by mouth once daily 14)  Metformin Hcl 500 Mg Tabs (Metformin hcl) .... 2 by mouth once daily 15)  Alendronate Sodium 70 Mg Tabs (Alendronate sodium) .Marland Kitchen.. 1 by mouth q wk 16)  Cephalexin 500 Mg Caps (Cephalexin) .Marland Kitchen.. 1po three times a day 17)  Hydrocodone-homatropine 5-1.5 Mg/5ml Syrp (Hydrocodone-homatropine) .Marland Kitchen.. 1 tsp by mouth q 6 hrs as needed cough  Other Orders: TLB-B12 + Folate Pnl (82746_82607-B12/FOL)  Patient Instructions: 1)  Please take all new medications as prescribed  - the antibiotic, cough medicine, and the medication for the osteoporosis (bone loss) 2)  Continue all previous medications as before this visit  3)  Please go to the Lab in the basement for your blood and/or urine tests today  4)  please keep your appt with urology in October with the PSA as planned 5)  please make sure your medications at home match the medication list below 6)  Please schedule a follow-up appointment in 6 months  with: 7)  BMP prior to visit, ICD-9: 250.02 8)  Lipid Panel prior to visit, ICD-9: 9)  HbgA1C prior to visit, ICD-9: Prescriptions: HYDROCODONE-HOMATROPINE 5-1.5 MG/5ML SYRP (HYDROCODONE-HOMATROPINE) 1 tsp by mouth q 6 hrs as needed cough  #6oz x 1   Entered and Authorized by:   Corwin Levins MD   Signed by:   Corwin Levins MD on 06/02/2010   Method used:   Print then Give to Patient   RxID:   [id number] CEPHALEXIN 500 MG CAPS (CEPHALEXIN) 1po three times a day  #30 x 0   Entered and Authorized by:   Corwin Levins MD   Signed by:   Corwin Levins MD on 06/02/2010   Method used:   Print then Give to Patient   RxID:   [id number] ALENDRONATE SODIUM 70 MG TABS (ALENDRONATE SODIUM) 1 by mouth q wk  #12 x 3   Entered and Authorized by:   Corwin Levins MD   Signed by:   Corwin Levins MD on 06/02/2010   Method used:   Print then Give to Patient   RxID:   [id number]

## 2011-01-26 ENCOUNTER — Telehealth: Payer: Self-pay | Admitting: Internal Medicine

## 2011-01-31 NOTE — Progress Notes (Signed)
Summary: Rx req  Phone Note Call from Patient Call back at Home Phone [phone]   Caller: Patient Summary of Call: Pt came into clinic stating [facility] advised that he does not owe any money to them and he could have his medications filled (see previous phone note).  Pt also states that [facility] will not accept Rx givena t last OV beacuse they were "copies". I reminded pt of what Zella Ball was told by [facility] but also advised that I will resent Rxs electronically and he should check with pharmacy next week for status. Pt understood. Initial call taken by: Margaret Pyle, CMA,  January 26, 2011 3:35 PM    Prescriptions: ACTOS 15 MG TABS (PIOGLITAZONE HCL) 1 by mouth once daily  #90 x 3   Entered by:   Margaret Pyle, CMA   Authorized by:   Corwin Levins MD   Signed by:   Margaret Pyle, CMA on 01/26/2011   Method used:   Faxed to ...       [facility] (mail-order)             , [place]         Ph: [phone]       Fax: [phone]   RxID:   [id number] METFORMIN HCL 500 MG TABS (METFORMIN HCL) 2 by mouth two times a day  #360 x 3   Entered by:   Margaret Pyle, CMA   Authorized by:   Corwin Levins MD   Signed by:   Margaret Pyle, CMA on 01/26/2011   Method used:   Faxed to ...       [facility] (mail-[facility])             , [place]         Ph: [phone]       Fax: [phone]   RxID:   [id number] UROXATRAL 10 MG XR24H-TAB (ALFUZOSIN HCL) 1 tab once daily  #90 x 3   Entered by:   Margaret Pyle, CMA   Authorized by:   Corwin Levins MD   Signed by:   Margaret Pyle, CMA on 01/26/2011   Method used:   Faxed to ...       [facility] (mail-order)             , [place]         Ph: [phone]       Fax: [phone]   RxID:   [id number] ALENDRONATE SODIUM 70 MG TABS (ALENDRONATE SODIUM) 1 by mouth q wk  #12 x 3   Entered by:   Margaret Pyle, CMA   Authorized by:   Corwin Levins MD   Signed by:   Margaret Pyle, CMA on  01/26/2011   Method used:   Faxed to ...       [facility] (mail-[facility])             , [place]         Ph: [phone]       Fax: [phone]   RxID:   [id number] METFORMIN HCL 500 MG TABS (METFORMIN HCL) 2 by mouth in the am, and 1 by mouth in the PM  #270 x 3   Entered by:   Margaret Pyle, CMA   Authorized by:   Corwin Levins MD   Signed by:   Margaret Pyle, CMA on 01/26/2011   Method used:   Faxed to ...       [facility] (mail-order)             ,  [place]         Ph: [phone]       Fax: [phone]   RxID:   [id number] ACTOS 30 MG TABS (PIOGLITAZONE HCL) 1 by mouth once daily  #90 x 3   Entered by:   Margaret Pyle, CMA   Authorized by:   Corwin Levins MD   Signed by:   Margaret Pyle, CMA on 01/26/2011   Method used:   Faxed to ...       [facility] (mail-[facility])             , [place]         Ph: [phone]       Fax: [phone]   RxID:   [id number] ONETOUCH LANCETS  MISC (LANCETS) use asd 1 once daily  #100 x 3   Entered by:   Margaret Pyle, CMA   Authorized by:   Corwin Levins MD   Signed by:   Margaret Pyle, CMA on 01/26/2011   Method used:   Faxed to ...       [facility] (mail-[facility])             , [place]         Ph: [phone]       Fax: [phone]   RxID:   [id number] [place] TEST  STRP (GLUCOSE BLOOD) use asd 1 once daily  #100 x 3   Entered by:   Margaret Pyle, CMA   Authorized by:   Corwin Levins MD   Signed by:   Margaret Pyle, CMA on 01/26/2011   Method used:   Faxed to ...       [facility] (mail-[facility])             , [place]         Ph: [phone]       Fax: [phone]   RxID:   [id number] GLIMEPIRIDE 4 MG TABS (GLIMEPIRIDE) 1 by mouth once daily  #90 x 3   Entered by:   Margaret Pyle, CMA   Authorized by:   Corwin Levins MD   Signed by:   Margaret Pyle, CMA on 01/26/2011   Method used:   Faxed to ...       [facility] (mail-[facility])             , [place]         Ph:  [phone]       Fax: [phone]   RxID:   [id number] FENOFIBRATE 160 MG TABS (FENOFIBRATE) 1po once daily  #90 x 3   Entered by:   Margaret Pyle, CMA   Authorized by:   Corwin Levins MD   Signed by:   Margaret Pyle, CMA on 01/26/2011   Method used:   Faxed to ...       [facility] (mail[facility])             , [place]         Ph: [phone]       Fax: [phone]   RxID:   [id number] DETROL LA 4 MG XR24H-CAP (TOLTERODINE TARTRATE) 1 by mouth once daily  #90 x 3   Entered by:   Margaret Pyle, CMA   Authorized by:   Corwin Levins MD   Signed by:   Margaret Pyle, CMA on 01/26/2011   Method used:   Faxed to ...       [facility] (mail-[facility])             ,  [place]         Ph: [phone]       Fax: [phone]   RxID:   [id number] LIPITOR 40 MG TABS (ATORVASTATIN CALCIUM) one by mouth daily  #90 x 3   Entered by:   Margaret Pyle, CMA   Authorized by:   Corwin Levins MD   Signed by:   Margaret Pyle, CMA on 01/26/2011   Method used:   Faxed to ...       [facility] (mail-[facility])             , [place]         Ph: [phone]       Fax: [phone]   RxID:   [id number] METOPROLOL SUCCINATE 50 MG  TB24 (METOPROLOL SUCCINATE) 1po once daily  #90 x 3   Entered by:   Margaret Pyle, CMA   Authorized by:   Corwin Levins MD   Signed by:   Margaret Pyle, CMA on 01/26/2011   Method used:   Faxed to ...       [facility] (mail[facility])             , [place]         Ph: [phone]       Fax: [phone]   RxID:   [id number] LISINOPRIL 40 MG TABS (LISINOPRIL) Take 1 tablet by mouth once a day  #90 x 3   Entered by:   Margaret Pyle, CMA   Authorized by:   Corwin Levins MD   Signed by:   Margaret Pyle, CMA on 01/26/2011   Method used:   Faxed to ...       [facility] (mail[facility])             , [place]         Ph: [phone]       Fax: [phone]   RxID:   [id number]

## 2011-01-31 NOTE — Progress Notes (Signed)
Summary: [facility] QUESTION  Phone Note Call from Patient Call back at [phone]   Reason for Call: Talk to Nurse Summary of Call: REQUEST TO SPEAK TO NURSE ABOUT WHY [facility] IS CALLING HIM, HE DID PICK UP MEDS FROM [place] Initial call taken by: Migdalia Dk,  January 24, 2011 3:43 PM  Follow-up for Phone Call        Patient sent in all prescriptions to [facility] from 12/08/2010 OV with Dr. Jonny Ruiz but had not received all the medications as of yet. Informed patient he would need to contact [facility] and inform them what he has received and followup with them as to why the others have not been sent.  Follow-up by: Zella Ball Ewing CMA (AAMA),  January 24, 2011 4:10 PM

## 2011-01-31 NOTE — Progress Notes (Signed)
  Phone Note Call from Patient   Caller: Patient Summary of Call: Patient came into office to get prescriptions sent to [facility]. I called [facility] [phone]) and the pt. did send all prescriptions from 11/2010 OV to [facility]. They filled only what they received payment for and sent back the prescriptions not paid for. They have filled his Lisinopril, Alendronate, metformin, Metoprolol and uroxatral. [facility] has notified the patient of the need for full payment.The patient has been informed of the above information and will call [facility] back. Initial call taken by: Robin Ewing CMA Duncan Dull),  January 25, 2011 4:42 PM

## 2011-02-01 ENCOUNTER — Telehealth: Payer: Self-pay | Admitting: Internal Medicine

## 2011-02-08 NOTE — Progress Notes (Signed)
Summary: medication clarification  Phone Note From Pharmacy   Caller: [facility] Summary of Call: [facility] requesting clarification on 2 prescription refills. They have received 2 different prescriptions for metformin and for Actos. They need clarification on what to fill.  Initial call taken by: Robin Ewing CMA Duncan Dull),  February 01, 2011 4:17 PM  Follow-up for Phone Call        sorry for the confusion   the meds on the list should be the correct meds Follow-up by: Corwin Levins MD,  February 01, 2011 5:33 PM  Additional Follow-up for Phone Call Additional follow up Details #1::        No [facility] call back # or ref # provided. Resent rx's Additional Follow-up by: Lamar Sprinkles, CMA,  February 01, 2011 6:45 PM    Prescriptions: ACTOS 15 MG TABS (PIOGLITAZONE HCL) 1 by mouth once daily  #90 x 3   Entered by:   Lamar Sprinkles, CMA   Authorized by:   Corwin Levins MD   Signed by:   Lamar Sprinkles, CMA on 02/01/2011   Method used:   Faxed to ...       [facility] (mail-[facility])             , [place]         Ph: [phone]       Fax: [phone]   RxID:   [id number] METFORMIN HCL 500 MG TABS (METFORMIN HCL) 2 by mouth two times a day  #360 x 3   Entered by:   Lamar Sprinkles, CMA   Authorized by:   Corwin Levins MD   Signed by:   Lamar Sprinkles, CMA on 02/01/2011   Method used:   Faxed to ...       [facility] ([facility])             , [place]         Ph: [phone]       Fax: [phone]   RxID:   [id number]

## 2011-03-06 ENCOUNTER — Other Ambulatory Visit: Payer: Medicare Other

## 2011-03-06 ENCOUNTER — Encounter: Payer: Self-pay | Admitting: Internal Medicine

## 2011-03-06 ENCOUNTER — Ambulatory Visit (INDEPENDENT_AMBULATORY_CARE_PROVIDER_SITE_OTHER): Payer: Medicare Other | Admitting: Internal Medicine

## 2011-03-06 ENCOUNTER — Other Ambulatory Visit: Payer: Self-pay | Admitting: Internal Medicine

## 2011-03-06 ENCOUNTER — Ambulatory Visit (INDEPENDENT_AMBULATORY_CARE_PROVIDER_SITE_OTHER)
Admission: RE | Admit: 2011-03-06 | Discharge: 2011-03-06 | Disposition: A | Discharge status: Home / Self Care | Payer: Medicare Other | Source: Ambulatory Visit | Attending: Internal Medicine | Admitting: Internal Medicine

## 2011-03-06 DIAGNOSIS — I951 Orthostatic hypotension: Secondary | ICD-10-CM

## 2011-03-06 DIAGNOSIS — E785 Hyperlipidemia, unspecified: Secondary | ICD-10-CM

## 2011-03-06 DIAGNOSIS — F3289 Other specified depressive episodes: Secondary | ICD-10-CM

## 2011-03-06 DIAGNOSIS — E119 Type 2 diabetes mellitus without complications: Secondary | ICD-10-CM

## 2011-03-06 DIAGNOSIS — F329 Major depressive disorder, single episode, unspecified: Secondary | ICD-10-CM

## 2011-03-06 HISTORY — DX: Orthostatic hypotension: I95.1

## 2011-03-06 LAB — CBC WITH DIFFERENTIAL/PLATELET
Basophils Absolute: 0 10*3/uL (ref 0.0–0.1)
Eosinophils Relative: 3.2 % (ref 0.0–5.0)
Hemoglobin: 14 g/dL (ref 13.0–17.0)
Lymphs Abs: 1.4 10*3/uL (ref 0.7–4.0)
MCHC: 34.2 g/dL (ref 30.0–36.0)
Neutro Abs: 3.4 10*3/uL (ref 1.4–7.7)
Platelets: 149 10*3/uL — ABNORMAL LOW (ref 150.0–400.0)
RBC: 4.4 Mil/uL (ref 4.22–5.81)
RDW: 13.7 % (ref 11.5–14.6)
WBC: 5.4 10*3/uL (ref 4.5–10.5)

## 2011-03-06 LAB — URINALYSIS, ROUTINE W REFLEX MICROSCOPIC
Bilirubin Urine: NEGATIVE
Ketones, ur: NEGATIVE
Leukocytes, UA: NEGATIVE
Nitrite: NEGATIVE
Urobilinogen, UA: 1 (ref 0.0–1.0)

## 2011-03-06 LAB — HEPATIC FUNCTION PANEL
ALT: 16 U/L (ref 0–53)
Alkaline Phosphatase: 45 U/L (ref 39–117)
Bilirubin, Direct: 0.2 mg/dL (ref 0.0–0.3)
Total Bilirubin: 1 mg/dL (ref 0.3–1.2)

## 2011-03-06 LAB — HEMOGLOBIN A1C: Hgb A1c MFr Bld: 6.6 % — ABNORMAL HIGH (ref 4.6–6.5)

## 2011-03-06 LAB — BASIC METABOLIC PANEL
Calcium: 9 mg/dL (ref 8.4–10.5)
Creatinine, Ser: 1.7 mg/dL — ABNORMAL HIGH (ref 0.4–1.5)
GFR: 41.99 mL/min — ABNORMAL LOW (ref >60.00)
Glucose, Bld: 54 mg/dL — ABNORMAL LOW (ref 70–99)
Potassium: 5.1 mEq/L (ref 3.5–5.1)

## 2011-03-06 LAB — LIPID PANEL
Cholesterol: 126 mg/dL (ref 0–200)
Total CHOL/HDL Ratio: 4

## 2011-03-07 ENCOUNTER — Encounter: Payer: Self-pay | Admitting: Internal Medicine

## 2011-03-09 ENCOUNTER — Emergency Department (HOSPITAL_COMMUNITY): Payer: Medicare Other

## 2011-03-09 ENCOUNTER — Inpatient Hospital Stay (HOSPITAL_COMMUNITY)
Admission: EM | Admit: 2011-03-09 | Discharge: 2011-03-20 | DRG: 243 | Disposition: A | Discharge status: Home Health | Payer: Medicare Other | Attending: Internal Medicine | Admitting: Internal Medicine

## 2011-03-09 ENCOUNTER — Telehealth: Payer: Self-pay | Admitting: Internal Medicine

## 2011-03-09 DIAGNOSIS — E1149 Type 2 diabetes mellitus with other diabetic neurological complication: Secondary | ICD-10-CM | POA: Diagnosis present

## 2011-03-09 DIAGNOSIS — F341 Dysthymic disorder: Secondary | ICD-10-CM | POA: Diagnosis present

## 2011-03-09 DIAGNOSIS — W010XXA Fall on same level from slipping, tripping and stumbling without subsequent striking against object, initial encounter: Secondary | ICD-10-CM | POA: Diagnosis present

## 2011-03-09 DIAGNOSIS — I2510 Atherosclerotic heart disease of native coronary artery without angina pectoris: Secondary | ICD-10-CM | POA: Diagnosis present

## 2011-03-09 DIAGNOSIS — I824Y9 Acute embolism and thrombosis of unspecified deep veins of unspecified proximal lower extremity: Secondary | ICD-10-CM | POA: Diagnosis not present

## 2011-03-09 DIAGNOSIS — T465X5A Adverse effect of other antihypertensive drugs, initial encounter: Secondary | ICD-10-CM | POA: Diagnosis present

## 2011-03-09 DIAGNOSIS — E785 Hyperlipidemia, unspecified: Secondary | ICD-10-CM | POA: Diagnosis present

## 2011-03-09 DIAGNOSIS — E1142 Type 2 diabetes mellitus with diabetic polyneuropathy: Secondary | ICD-10-CM | POA: Diagnosis present

## 2011-03-09 DIAGNOSIS — Z96659 Presence of unspecified artificial knee joint: Secondary | ICD-10-CM

## 2011-03-09 DIAGNOSIS — I441 Atrioventricular block, second degree: Secondary | ICD-10-CM | POA: Diagnosis not present

## 2011-03-09 DIAGNOSIS — Y92009 Unspecified place in unspecified non-institutional (private) residence as the place of occurrence of the external cause: Secondary | ICD-10-CM

## 2011-03-09 DIAGNOSIS — E860 Dehydration: Secondary | ICD-10-CM | POA: Diagnosis present

## 2011-03-09 DIAGNOSIS — Z951 Presence of aortocoronary bypass graft: Secondary | ICD-10-CM

## 2011-03-09 DIAGNOSIS — I10 Essential (primary) hypertension: Secondary | ICD-10-CM | POA: Diagnosis present

## 2011-03-09 DIAGNOSIS — Z0289 Encounter for other administrative examinations: Secondary | ICD-10-CM

## 2011-03-09 DIAGNOSIS — N289 Disorder of kidney and ureter, unspecified: Secondary | ICD-10-CM | POA: Diagnosis present

## 2011-03-09 DIAGNOSIS — I739 Peripheral vascular disease, unspecified: Secondary | ICD-10-CM | POA: Diagnosis present

## 2011-03-09 DIAGNOSIS — I951 Orthostatic hypotension: Principal | ICD-10-CM | POA: Diagnosis present

## 2011-03-09 DIAGNOSIS — T43205A Adverse effect of unspecified antidepressants, initial encounter: Secondary | ICD-10-CM | POA: Diagnosis present

## 2011-03-09 DIAGNOSIS — Z8546 Personal history of malignant neoplasm of prostate: Secondary | ICD-10-CM

## 2011-03-09 LAB — TROPONIN I: Troponin I: 0.01 ng/mL (ref 0.00–0.06)

## 2011-03-09 LAB — DIFFERENTIAL
Basophils Absolute: 0 10*3/uL (ref 0.0–0.1)
Basophils Relative: 0 % (ref 0–1)
Eosinophils Absolute: 0.1 10*3/uL (ref 0.0–0.7)
Eosinophils Relative: 2 % (ref 0–5)
Neutrophils Relative %: 75 % (ref 43–77)

## 2011-03-09 LAB — COMPREHENSIVE METABOLIC PANEL
AST: 21 U/L (ref 0–37)
Albumin: 3.3 g/dL — ABNORMAL LOW (ref 3.5–5.2)
Alkaline Phosphatase: 47 U/L (ref 39–117)
BUN: 39 mg/dL — ABNORMAL HIGH (ref 6–23)
GFR calc Af Amer: 49 mL/min — ABNORMAL LOW (ref >60)
Potassium: 5.2 mEq/L — ABNORMAL HIGH (ref 3.5–5.1)
Sodium: 133 mEq/L — ABNORMAL LOW (ref 135–145)
Total Protein: 6.3 g/dL (ref 6.0–8.3)

## 2011-03-09 LAB — POCT CARDIAC MARKERS
CKMB, poc: <1 ng/mL — ABNORMAL LOW (ref 1.0–8.0)
Myoglobin, poc: 99.6 ng/mL (ref 12–200)
Myoglobin, poc: 79.5 ng/mL (ref 12–200)
Troponin i, poc: <0.05 ng/mL (ref 0.00–0.09)

## 2011-03-09 LAB — URINALYSIS, ROUTINE W REFLEX MICROSCOPIC
Ketones, ur: NEGATIVE mg/dL
Nitrite: NEGATIVE
Protein, ur: NEGATIVE mg/dL

## 2011-03-09 LAB — CARDIAC PANEL(CRET KIN+CKTOT+MB+TROPI)
CK, MB: 1.6 ng/mL (ref 0.3–4.0)
Troponin I: 0.01 ng/mL (ref 0.00–0.06)

## 2011-03-09 LAB — PROTIME-INR: INR: 1.18 (ref 0.00–1.49)

## 2011-03-09 LAB — GLUCOSE, CAPILLARY: Glucose-Capillary: 125 mg/dL — ABNORMAL HIGH (ref 70–99)

## 2011-03-09 LAB — CBC
Platelets: 112 10*3/uL — ABNORMAL LOW (ref 150–400)
RDW: 13 % (ref 11.5–15.5)
WBC: 6.3 10*3/uL (ref 4.0–10.5)

## 2011-03-09 LAB — CK TOTAL AND CKMB (NOT AT ARMC): Total CK: 56 U/L (ref 7–232)

## 2011-03-09 LAB — MRSA PCR SCREENING: MRSA by PCR: NEGATIVE

## 2011-03-09 NOTE — H&P (Signed)
NAME:  Zachary Harris, TOMB NO.:  [id number]  MEDICAL RECORD NO.:  [id number]           PATIENT TYPE:  E  LOCATION:  WLED                         FACILITY:  [facility]  PHYSICIAN:  Kathlen Mody, MD       DATE OF BIRTH:  1923/02/10  DATE OF ADMISSION:  03/09/2011 DATE OF DISCHARGE:                             HISTORY & PHYSICAL   PRIMARY CARE PHYSICIAN:  Corwin Levins, MD  CHIEF COMPLAINT:  Dizziness, status post fall this morning.  HISTORY OF PRESENT ILLNESS:  I have an 77 year old gentleman with past medical history of coronary artery disease, hypertension, hyperlipidemia, non-insulin diabetes, came in complaining of dizziness and status post fall last night and this morning.  The patient has had these symptoms since 2 weeks with dizziness on standing up and went to his PCP, Dr. Oliver Barre, where he was found to be orthostatic in his office, and he lowered his lisinopril from 40 mg to 20 mg daily, but the patient states the symptoms have not improved even after the blood pressure medication dose has been decreased.  He denies any chest pain, shortness of breath or any fevers or chills, but last night the patient states he got up from the chair, felt dizzy.  He passed out for a few seconds and later this happened this morning, that is when he came to the ER.  In the ER, he was found to be orthostatic where his systolic blood pressure dropped from the lying down position of 133 to systolic blood pressure on standing up of 90 mmHg.  The patient denies any nausea, vomiting, abdominal pain or diarrhea.  Denies fever or chills. Denies any cough.  Occasional headache.  No blurry vision.  No focal weakness or tingling and numbness anywhere in the body.  The patient did not have similar complaints in the past.  No focal deficits.  REVIEW OF SYSTEMS:  See HPI, otherwise negative.  PAST MEDICAL HISTORY: 1. Hypertension. 2. Hyperlipidemia. 3. Non-insulin diabetes  mellitus. 4. Coronary artery disease status post CABG in 2007. 5. Prostate cancer. 6. Peripheral vascular disease.  PAST SURGICAL HISTORY: 1. CABG. 2. Knee replacement.  SOCIAL HISTORY:  The patient lives at [facility] in their own apartment with his wife.  He denies smoking, EtOH or IV drug abuse.  ALLERGIES:  No known drug allergies.  FAMILY HISTORY:  Not significant.  HOME MEDICATIONS:  The patient is on: 1. Actos 15 mg once daily. 2. Alendronate 70 mg once weekly. 3. Aspirin 325 mg daily. 4. Detrol 4 mg daily. 5. Fenofibrate 160 mg once daily. 6. Glimepiride 4 mg daily. 7. Lipitor 40 mg daily. 8. Lisinopril 20 mg once daily. 9. Metformin 1000 mg twice a day. 10.Metoprolol 50 mg once daily. 11.Uroxatral 10 mg daily. 12.Cymbalta 60 mg daily.  EKG, normal sinus rhythm at 71, unchanged from previous EKG.  PHYSICAL EXAMINATION:  VITAL SIGNS:  Temperature afebrile, pulse of 89 per minute, respiratory rate of 20 per minute, blood pressure of 120/60 on lying down and on standing up the patient's blood pressure dropped to 90/60, pulse of 120 per minute, saturating  99% on room air. GENERAL:  He is an alert, afebrile, comfortable, lying in bed.  Denies any complaints at this time. HEENT:  Pupils equal and reacting to light.  Dry mucous membranes. NECK:  No JVD. CARDIOVASCULAR:  S1, S2 heard.  Regular rate and rhythm.  No rubs, murmurs or gallops. RESPIRATORY:  Good air entry bilateral. ABDOMEN:  Soft, nontender, nondistended.  Positive bowel sounds. EXTREMITIES:  No pedal edema. NEUROLOGICAL:  No focal deficit, was able to move all extremities.  PERTINENT LABORATORY DATA:  The patient had on admission 2 point-of-care cardiac markers which were negative.  Comprehensive metabolic panel significant for a sodium of 133, potassium of 5.2, chloride of 105, bicarb of 23, glucose of 224, BUN 39, creatinine of 1.64.  CBC showed platelets of 112.  The patient had a recent  hemoglobin A1c which was 6.6.  Lipid profile which showed HDL of 31.  Urinalysis negative for leukocytes and nitrites.  TSH 2.65, within normal limits.  Liver function tests within normal limits.  RADIOLOGY: 1. The patient had an x-ray of the pelvis, which did not show any     acute osseous abnormality. 2. X-ray of the spine on the left side, chronic T12 and L4 compression     fractures.  No abnormality. 3. Chest x-ray, low lung volumes without any cardiopulmonary disease.  ASSESSMENT AND PLAN:  This is an 77 year old gentleman with history of coronary artery disease status post coronary artery bypass graft, hypertension, hyperlipidemia, non-insulin-dependent diabetes, admitted for dizziness and was found to be orthostatic at PMD's office and also in the ER and history of syncope last night and this morning most likely secondary to orthostatic hypotension, but we will also rule out cardiac etiology.  I have an echocardiogram from 2009, which showed good ejection fraction without valve abnormalities.  We will repeat the 2D echocardiogram.  We will get 2 sets of cardiac enzymes on the floor every 6 hours.  We will admit the patient under observation to telemetry.  Stop the blood pressure medications, lisinopril and metoprolol.  Start him on IV fluids, normal saline at 80 cc/hour.  The patient does not appear to be hypoglycemic as his sugar was in 200s. So, I do not think it is a hypoglycemic episode.  The patient denies hitting his head and no focal deficits.  I do not think this is cerebrovascular accident.  Acute renal failure, creatinine of 1.6.  The patient's baseline creatinine is normal.  The patient definitely appears to be dehydrated with a sodium of 133, potassium of 5.2, BUN of 39 and creatinine of 1.6. We will hold metformin and lisinopril at this time, start him on IV fluids normal saline at 80 cc/hour and repeat electrolytes in the morning.  Non-insulin diabetes  mellitus.  His hemoglobin is very slightly high at 6.6.  I would definitely hold metformin secondary to renal insufficiency and also glimepiride because it may cause hypoglycemia.  We will put the patient on NovoLog sliding scale with sensitive scale coverage for now while he is in the hospital.  Hyperlipidemia.  Recent lipid profile appears to be within normal limits.  We will continue with his fenofibrate and Lipitor.  Coronary artery disease.  Continue with aspirin 325 mg daily.  Get a PT, OT evaluation.  The patient is full code.          ______________________________ Kathlen Mody, MD     VA/MEDQ  D:  03/09/2011  T:  03/09/2011  Job:  [id number]  Electronically  Signed by Kathlen Mody MD on 03/09/2011 06:00:48 PM

## 2011-03-10 DIAGNOSIS — I059 Rheumatic mitral valve disease, unspecified: Secondary | ICD-10-CM

## 2011-03-10 LAB — CARDIAC PANEL(CRET KIN+CKTOT+MB+TROPI)
CK, MB: 1.3 ng/mL (ref 0.3–4.0)
Relative Index: INVALID (ref 0.0–2.5)
Troponin I: 0.01 ng/mL (ref 0.00–0.06)

## 2011-03-10 LAB — BASIC METABOLIC PANEL
BUN: 21 mg/dL (ref 6–23)
Calcium: 8.4 mg/dL (ref 8.4–10.5)
Creatinine, Ser: 1.24 mg/dL (ref 0.4–1.5)
GFR calc non Af Amer: 55 mL/min — ABNORMAL LOW (ref >60)

## 2011-03-10 LAB — URINE CULTURE

## 2011-03-10 LAB — GLUCOSE, CAPILLARY
Glucose-Capillary: 106 mg/dL — ABNORMAL HIGH (ref 70–99)
Glucose-Capillary: 153 mg/dL — ABNORMAL HIGH (ref 70–99)

## 2011-03-11 LAB — BASIC METABOLIC PANEL
CO2: 22 mEq/L (ref 19–32)
Chloride: 110 mEq/L (ref 96–112)
Glucose, Bld: 114 mg/dL — ABNORMAL HIGH (ref 70–99)
Potassium: 4.5 mEq/L (ref 3.5–5.1)
Sodium: 136 mEq/L (ref 135–145)

## 2011-03-11 LAB — GLUCOSE, CAPILLARY
Glucose-Capillary: 143 mg/dL — ABNORMAL HIGH (ref 70–99)
Glucose-Capillary: 123 mg/dL — ABNORMAL HIGH (ref 70–99)

## 2011-03-11 LAB — CARDIAC PANEL(CRET KIN+CKTOT+MB+TROPI)
CK, MB: 1.2 ng/mL (ref 0.3–4.0)
Relative Index: INVALID (ref 0.0–2.5)
Total CK: 52 U/L (ref 7–232)

## 2011-03-11 LAB — CORTISOL-AM, BLOOD: Cortisol - AM: 8 ug/dL (ref 4.3–22.4)

## 2011-03-12 ENCOUNTER — Ambulatory Visit: Payer: Medicare Other | Admitting: Internal Medicine

## 2011-03-12 ENCOUNTER — Other Ambulatory Visit: Payer: Self-pay | Admitting: Internal Medicine

## 2011-03-12 DIAGNOSIS — M7989 Other specified soft tissue disorders: Secondary | ICD-10-CM

## 2011-03-12 DIAGNOSIS — I951 Orthostatic hypotension: Secondary | ICD-10-CM

## 2011-03-12 LAB — GLUCOSE, CAPILLARY
Glucose-Capillary: 114 mg/dL — ABNORMAL HIGH (ref 70–99)
Glucose-Capillary: 117 mg/dL — ABNORMAL HIGH (ref 70–99)
Glucose-Capillary: 83 mg/dL (ref 70–99)

## 2011-03-12 LAB — BASIC METABOLIC PANEL
BUN: 20 mg/dL (ref 6–23)
Calcium: 8.8 mg/dL (ref 8.4–10.5)
GFR calc non Af Amer: >60 mL/min (ref >60)
Potassium: 4.6 mEq/L (ref 3.5–5.1)
Sodium: 135 mEq/L (ref 135–145)

## 2011-03-12 LAB — CARDIAC PANEL(CRET KIN+CKTOT+MB+TROPI)
CK, MB: 1.5 ng/mL (ref 0.3–4.0)
CK, MB: 1.5 ng/mL (ref 0.3–4.0)
Troponin I: 0.1 ng/mL — ABNORMAL HIGH (ref 0.00–0.06)

## 2011-03-13 LAB — GLUCOSE, CAPILLARY
Glucose-Capillary: 155 mg/dL — ABNORMAL HIGH (ref 70–99)
Glucose-Capillary: 151 mg/dL — ABNORMAL HIGH (ref 70–99)

## 2011-03-13 LAB — CBC
Hemoglobin: 14.6 g/dL (ref 13.0–17.0)
MCH: 30.5 pg (ref 26.0–34.0)
MCHC: 33.1 g/dL (ref 30.0–36.0)
Platelets: 179 10*3/uL (ref 150–400)
RDW: 13.2 % (ref 11.5–15.5)

## 2011-03-13 NOTE — Progress Notes (Signed)
Summary: falling & dizzy  Phone Note Call from Patient Call back at Home Phone [phone]   Caller: Patient---[phone] Call For: Corwin Levins MD Summary of Call: Pt fell last pm flat on the floor, he fell again this am, every time he stands up he gets so dizzy he falls. Please advise. Initial call taken by: Verdell Face,  March 09, 2011 9:10 AM  Follow-up for Phone Call        left several messges for pt on home number. Margaret Pyle, CMA  March 09, 2011 2:51 PM    Pt is is being admitting to [facility] hospital for orthostatic hypotention.  Follow-up by: Margaret Pyle, CMA,  March 09, 2011 3:18 PM  Additional Follow-up for Phone Call Additional follow up Details #1::        noted Additional Follow-up by: Corwin Levins MD,  March 09, 2011 3:31 PM

## 2011-03-13 NOTE — Assessment & Plan Note (Signed)
Summary: weak,loss of appetite, fatigue/lb   Vital Signs:  Patient profile:   77 year old male Height:      69 inches Weight:      199.25 pounds BMI:     29.53 O2 Sat:      96 % on Room air Temp:     97.6 degrees F oral Pulse rate:   56 / minute BP supine:   112 / 60 BP sitting:   104 / 58  (left arm) BP standing:   88 / 54 Cuff size:   regular  Vitals Entered By: Zella Ball Ewing CMA Duncan Dull) (March 06, 2011 11:38 AM)  O2 Flow:  Room air CC: Weak and loss of appetite/RE   Primary Care Provider:  Oliver Barre MD  CC:  Weak and loss of appetite/RE.  History of Present Illness: here with concerned son;  c/o 1 wk onset unsusual for him fatigue, sleepiness/sleeping more and dizziness standing and ambulation as well as decreased appetitie;  Pt denies CP, worsening sob, doe, wheezing, orthopnea, pnd, worsening LE edema, palps, or syncope .  Pt denies new neuro symptoms such as headache, facial or extremity weakness  Pt denies polydipsia, polyuria, or low sugar symptoms such as shakiness improved with eating.  Overall good compliance with meds, trying to follow low chol, DM diet, wt stable, little excercise however  CBG's in the lower 100's.  No fever, wt loss, night sweats, loss of appetite or other constitutional symptoms  No Gu symtpoms specific such as dysuria or hematuria.  Overall good compliance with meds, and good tolerability, not on diuretic.  Denies worsening depressive symptoms, suicidal ideation, or panic, though has had some anxiety in the past.  In general no worsening specific pain - just cant past the dizziness, fatigue and sleepiness/fatigue.  Still goes in to the workplace daily (son does the work, however , though pt is there, still driving).  Takes by mouth ok - denies dysphagia, n/v,abd pain, bowel change or blood.  No other bleeding or bruising.  No signficant wt loss.  Does have chronic bilat feet pain, has seen podiatry, cymbalta may not be working very well.  Does not check BP  at home or other,  Has seen Dr Daleen Squibb on regular basis as well.    Problems Prior to Update: 1)  Hypotension, Orthostatic  (ICD-458.0) 2)  Edema  (ICD-782.3) 3)  Foot Pain, Bilateral  (ICD-729.5) 4)  Secondary Dm W/peripheral Circ D/o Uncontrolled  (ICD-249.71) 5)  Carotid Artery Disease  (ICD-433.10) 6)  Osteoporosis  (ICD-733.00) 7)  Vertebral Fracture  (ICD-805.8) 8)  Back Pain  (ICD-724.5) 9)  Myocardial Infarction, Hx of  (ICD-412) 10)  Coronary Artery Disease  (ICD-414.00) 11)  Peripheral Vascular Disease  (ICD-443.9) 12)  Hyperlipidemia  (ICD-272.4) 13)  Hypertension  (ICD-401.9) 14)  Syncope  (ICD-780.2) 15)  Renal Insufficiency  (ICD-588.9) 16)  Gerd  (ICD-530.81) 17)  Diabetes Mellitus, Type II  (ICD-250.00) 18)  Anxiety  (ICD-300.00) 19)  Depression  (ICD-311) 20)  Benign Prostatic Hypertrophy  (ICD-600.00) 21)  Unspecified Hearing Loss  (ICD-389.9) 22)  Groin Pain  (ICD-789.09) 23)  Memory Loss  (ICD-780.93) 24)  Preventive Health Care  (ICD-V70.0) 25)  Cellulitis, Face  (ICD-682.0) 26)  Rash-nonvesicular  (ICD-782.1) 27)  Family History Diabetes 1st Degree Relative  (ICD-V18.0) 28)  Anemia-iron Deficiency  (ICD-280.9) 29)  Diverticulitis, Hx of  (ICD-V12.79) 30)  Colonic Polyps, Hx of  (ICD-V12.72)  Medications Prior to Update: 1)  Lisinopril 40 Mg Tabs (  Lisinopril) .... Take 1 Tablet By Mouth Once A Day 2)  Metoprolol Succinate 50 Mg  Tb24 (Metoprolol Succinate) .Marland Kitchen.. 1po Once Daily 3)  Aspirin Ec 325 Mg Tbec (Aspirin) .... Take One Tablet By Mouth Daily 4)  Lipitor 40 Mg Tabs (Atorvastatin Calcium) .... One By Mouth Daily 5)  Detrol La 4 Mg Xr24h-Cap (Tolterodine Tartrate) .Marland Kitchen.. 1 By Mouth Once Daily 6)  Fenofibrate 160 Mg Tabs (Fenofibrate) .Marland Kitchen.. 1po Once Daily 7)  Glimepiride 4 Mg Tabs (Glimepiride) .Marland Kitchen.. 1 By Mouth Once Daily 8)  Onetouch Ultra Test  Strp (Glucose Blood) .... Use Asd 1 Once Daily 9)  Onetouch Lancets  Misc (Lancets) .... Use Asd 1 Once  Daily 10)  Alendronate Sodium 70 Mg Tabs (Alendronate Sodium) .Marland Kitchen.. 1 By Mouth Q Wk 11)  Uroxatral 10 Mg Xr24h-Tab (Alfuzosin Hcl) .Marland Kitchen.. 1 Tab Once Daily 12)  Metformin Hcl 500 Mg Tabs (Metformin Hcl) .... 2 By Mouth Two Times A Day 13)  Actos 15 Mg Tabs (Pioglitazone Hcl) .Marland Kitchen.. 1 By Mouth Once Daily  Current Medications (verified): 1)  Lisinopril 40 Mg Tabs (Lisinopril) .... Take 1/2  Tablet By Mouth Once A Day 2)  Metoprolol Succinate 50 Mg  Tb24 (Metoprolol Succinate) .Marland Kitchen.. 1po Once Daily 3)  Aspirin Ec 325 Mg Tbec (Aspirin) .... Take One Tablet By Mouth Daily 4)  Lipitor 40 Mg Tabs (Atorvastatin Calcium) .... One By Mouth Daily 5)  Detrol La 4 Mg Xr24h-Cap (Tolterodine Tartrate) .Marland Kitchen.. 1 By Mouth Once Daily 6)  Fenofibrate 160 Mg Tabs (Fenofibrate) .Marland Kitchen.. 1po Once Daily 7)  Glimepiride 4 Mg Tabs (Glimepiride) .Marland Kitchen.. 1 By Mouth Once Daily 8)  Onetouch Ultra Test  Strp (Glucose Blood) .... Use Asd 1 Once Daily 9)  Onetouch Lancets  Misc (Lancets) .... Use Asd 1 Once Daily 10)  Alendronate Sodium 70 Mg Tabs (Alendronate Sodium) .Marland Kitchen.. 1 By Mouth Q Wk 11)  Uroxatral 10 Mg Xr24h-Tab (Alfuzosin Hcl) .Marland Kitchen.. 1 Tab Once Daily 12)  Metformin Hcl 500 Mg Tabs (Metformin Hcl) .... 2 By Mouth Two Times A Day 13)  Actos 15 Mg Tabs (Pioglitazone Hcl) .Marland Kitchen.. 1 By Mouth Once Daily  Allergies (verified): No Known Drug Allergies  Past History:  Past Medical History: Last updated: 11/09/2010 MYOCARDIAL INFARCTION, HX OF (ICD-412) 8/07 CORONARY ARTERY DISEASE (ICD-414.00) s/p CABG PERIPHERAL VASCULAR DISEASE (ICD-443.9) HYPERLIPIDEMIA (ICD-272.4) HYPERTENSION (ICD-401.9) SYNCOPE (ICD-780.2) RENAL INSUFFICIENCY (ICD-588.9) GERD (ICD-530.81) DIABETES MELLITUS, TYPE II (ICD-250.00) ANXIETY (ICD-300.00) DEPRESSION (ICD-311) BENIGN PROSTATIC HYPERTROPHY (ICD-600.00) UNSPECIFIED HEARING LOSS (ICD-389.9) GROIN PAIN (ICD-789.09) MEMORY LOSS (ICD-780.93) PREVENTIVE HEALTH CARE (ICD-V70.0) CELLULITIS, FACE  (ICD-682.0) RASH-NONVESICULAR (ICD-782.1) FAMILY HISTORY DIABETES 1ST DEGREE RELATIVE (ICD-V18.0) ANEMIA-IRON DEFICIENCY (ICD-280.9) DIVERTICULITIS, HX OF (ICD-V12.79) COLONIC POLYPS, HX OF (ICD-V12.72) pelvic fx 3/07  Osteoporosis  Past Surgical History: Last updated: 07/01/2009 Coronary artery bypass graft s/p coronary stent x 1 Cholecystectomy s/p bilat knee replacement 1992  Family History: Last updated: 11/14/2007 Family History Diabetes 1st degree relative - father in his 50's  Social History: Last updated: 12/08/2010 Former Smoker Alcohol use-no 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 Family History Diabetes 1st degree relative - father in his 79's  Social History: Reviewed history from 12/08/2010 and no changes required. 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         all otherwise negative per pt -  hearing loss is stable   Physical Exam  General:  alert and overweight-appearing. , appears fatigued Head:  normocephalic and atraumatic.   Eyes:  vision grossly intact, pupils equal, and pupils round.   Ears:  R ear normal and L ear normal.   Nose:  no external deformity and no nasal discharge.   Mouth:  no gingival abnormalities and pharynx pink and moist.   Neck:  supple, no masses, no JVD, and no cervical lymphadenopathy.   Lungs:   normal respiratory effort and normal breath sounds.   Heart:  normal rate and regular rhythm.   Abdomen:  soft, non-tender, normal bowel sounds, no distention, and no masses.   Msk:  no acute joint tenderness and no joint swelling.   Extremities:  no edema, no erythema  Neurologic:  cranial nerves II-XII intact, strength normal in all extremities, and gait normal.  except wears hearing aids Skin:  color normal and no rashes.   Psych:  not anxious appearing and not depressed appearing.     Impression & Recommendations:  Problem # 1:  HYPOTENSION, ORTHOSTATIC (ICD-458.0)  not on diuretic , denies decreased by mouth intake;  I suppose could be neurological suc has autonomic neuropathy but no definite hx of this;  for routine labs today, cxr (r/o subclinical pna or other);  and decrase the lisinopril to 20 mg;  with f/u next wk  Orders: TLB-BMP (Basic Metabolic Panel-BMET) (80048-METABOL) TLB-CBC Platelet - w/Differential (85025-CBCD) TLB-Hepatic/Liver Function Pnl (80076-HEPATIC) TLB-TSH (Thyroid Stimulating Hormone) (84443-TSH) T-Culture, Urine (91478-29562) TLB-Udip w/ Micro (81001-URINE) T-2 View CXR, Same Day (71020.5TC) EKG w/ Interpretation (93000)  Problem # 2:  DIABETES MELLITUS, TYPE II (ICD-250.00)  His updated medication list for this problem includes:    Lisinopril 40 Mg Tabs (Lisinopril) .Marland Kitchen... Take 1/2  tablet by mouth once a day    Aspirin Ec 325 Mg Tbec (Aspirin) .Marland Kitchen... Take one tablet by mouth daily    Glimepiride 4 Mg Tabs (Glimepiride) .Marland Kitchen... 1 by mouth once daily    Metformin Hcl 500 Mg Tabs (Metformin hcl) .Marland Kitchen... 2 by mouth two times a day    Actos 15 Mg Tabs (Pioglitazone hcl) .Marland Kitchen... 1 by mouth once daily  Orders: TLB-A1C / Hgb A1C (Glycohemoglobin) (83036-A1C)  Labs Reviewed: Creat: 1.1 (12/01/2010)    Reviewed HgBA1c results: 7.0 (12/01/2010)  7.6 (07/19/2010) stable overall by hx and exam, ok to continue meds/tx as is ,elev sugars can cuase diuresis to  exacerbate the low volume but doubt this is singifcant issue  Problem # 3:  HYPERLIPIDEMIA (ICD-272.4)  His updated medication list for this problem includes:    Lipitor 40 Mg Tabs (Atorvastatin calcium) ..... One by mouth daily    Fenofibrate 160 Mg Tabs (Fenofibrate) .Marland Kitchen... 1po once daily  Orders: TLB-Lipid Panel (80061-LIPID)  Labs Reviewed: SGOT: 21 (06/02/2010)   SGPT: 23 (06/02/2010)   HDL:27.20 (12/01/2010), 27.70 (07/19/2010)  LDL:77 (12/01/2010), 83 (06/02/2010)  Chol:139 (12/01/2010), 187 (07/19/2010)  Trig:174.0 (12/01/2010), 403.0 (07/19/2010) stable overall by hx and exam, ok to continue meds/tx as is , ok to cont meds as is  Problem # 4:  DEPRESSION (ICD-311) stable overall by hx and exam, ok to continue meds/tx as is - also doubt signicant  factor in above, ok to hold on trial SSRI  Complete Medication List: 1)  Lisinopril 40 Mg Tabs (Lisinopril) .... Take 1/2  tablet by mouth once a day 2)  Metoprolol Succinate 50 Mg Tb24 (Metoprolol succinate) .Marland Kitchen.. 1po once daily 3)  Aspirin Ec 325 Mg Tbec (Aspirin) .... Take one tablet by mouth daily 4)  Lipitor 40 Mg Tabs (Atorvastatin calcium) .... One by mouth daily 5)  Detrol La 4 Mg Xr24h-cap (Tolterodine tartrate) .Marland Kitchen.. 1 by mouth once daily 6)  Fenofibrate 160 Mg Tabs (Fenofibrate) .Marland Kitchen.. 1po once daily 7)  Glimepiride 4 Mg Tabs (Glimepiride) .Marland Kitchen.. 1 by mouth once daily 8)  Onetouch Ultra Test Strp (Glucose blood) .... Use asd 1 once daily 9)  Onetouch Lancets Misc (Lancets) .... Use asd 1 once daily 10)  Alendronate Sodium 70 Mg Tabs (Alendronate sodium) .Marland Kitchen.. 1 by mouth q wk 11)  Uroxatral 10 Mg Xr24h-tab (Alfuzosin hcl) .Marland Kitchen.. 1 tab once daily 12)  Metformin Hcl 500 Mg Tabs (Metformin hcl) .... 2 by mouth two times a day 13)  Actos 15 Mg Tabs (Pioglitazone hcl) .Marland Kitchen.. 1 by mouth once daily  Patient Instructions: 1)  decrease the lisinopril to 20 mg per day (HALF of the 40 mg) 2)  Please try to drink more fluids in the next few  days 3)  You should stay home from work for the rest of the week 4)  Please do not drive if you feel dizzy on standing 5)  Please go to the Lab in the basement for your blood and/or urine tests today 6)  Please go to Radiology in the basement level for your X-Ray today call the number on the [facility] Card for results of your testing  7)  Please schedule a follow-up appointment for Mon Mar 19   Orders Added: 1)  TLB-BMP (Basic Metabolic Panel-BMET) [80048-METABOL] 2)  TLB-CBC Platelet - w/Differential [85025-CBCD] 3)  TLB-Hepatic/Liver Function Pnl [80076-HEPATIC] 4)  TLB-TSH (Thyroid Stimulating Hormone) [84443-TSH] 5)  T-Culture, Urine [[id number]] 6)  TLB-Udip w/ Micro [81001-URINE] 7)  T-2 View CXR, Same Day [71020.5TC] 8)  EKG w/ Interpretation [93000] 9)  TLB-A1C / Hgb A1C (Glycohemoglobin) [83036-A1C] 10)  TLB-Lipid Panel [80061-LIPID] 11)  Est. Patient Level V [09811]

## 2011-03-14 LAB — GLUCOSE, CAPILLARY
Glucose-Capillary: 144 mg/dL — ABNORMAL HIGH (ref 70–99)
Glucose-Capillary: 179 mg/dL — ABNORMAL HIGH (ref 70–99)
Glucose-Capillary: 104 mg/dL — ABNORMAL HIGH (ref 70–99)

## 2011-03-14 LAB — PROTIME-INR: INR: 1.12 (ref 0.00–1.49)

## 2011-03-15 ENCOUNTER — Telehealth: Payer: Self-pay | Admitting: Cardiology

## 2011-03-15 LAB — COMPREHENSIVE METABOLIC PANEL
ALT: 14 U/L (ref 0–53)
CO2: 23 mEq/L (ref 19–32)
Calcium: 9 mg/dL (ref 8.4–10.5)
Creatinine, Ser: 1.58 mg/dL — ABNORMAL HIGH (ref 0.4–1.5)
GFR calc non Af Amer: 42 mL/min — ABNORMAL LOW (ref >60)
Glucose, Bld: 277 mg/dL — ABNORMAL HIGH (ref 70–99)
Sodium: 134 mEq/L — ABNORMAL LOW (ref 135–145)
Total Protein: 6.4 g/dL (ref 6.0–8.3)

## 2011-03-15 LAB — GLUCOSE, CAPILLARY
Glucose-Capillary: 152 mg/dL — ABNORMAL HIGH (ref 70–99)
Glucose-Capillary: 210 mg/dL — ABNORMAL HIGH (ref 70–99)
Glucose-Capillary: 97 mg/dL (ref 70–99)
Glucose-Capillary: 217 mg/dL — ABNORMAL HIGH (ref 70–99)

## 2011-03-15 LAB — CBC
HCT: 42.2 % (ref 39.0–52.0)
Hemoglobin: 14.3 g/dL (ref 13.0–17.0)
RDW: 13.5 % (ref 11.5–15.5)
WBC: 5.6 10*3/uL (ref 4.0–10.5)

## 2011-03-15 LAB — CARDIAC PANEL(CRET KIN+CKTOT+MB+TROPI)
Relative Index: INVALID (ref 0.0–2.5)
Total CK: 37 U/L (ref 7–232)
Troponin I: 0.02 ng/mL (ref 0.00–0.06)

## 2011-03-15 LAB — PROTIME-INR: Prothrombin Time: 16.1 seconds — ABNORMAL HIGH (ref 11.6–15.2)

## 2011-03-16 DIAGNOSIS — R55 Syncope and collapse: Secondary | ICD-10-CM

## 2011-03-16 DIAGNOSIS — I441 Atrioventricular block, second degree: Secondary | ICD-10-CM

## 2011-03-16 LAB — GLUCOSE, CAPILLARY: Glucose-Capillary: 215 mg/dL — ABNORMAL HIGH (ref 70–99)

## 2011-03-16 LAB — CBC
MCV: 92.5 fL (ref 78.0–100.0)
Platelets: 185 10*3/uL (ref 150–400)
RBC: 4.65 MIL/uL (ref 4.22–5.81)
RDW: 13.3 % (ref 11.5–15.5)
WBC: 5.7 10*3/uL (ref 4.0–10.5)

## 2011-03-16 LAB — BASIC METABOLIC PANEL
Chloride: 104 mEq/L (ref 96–112)
GFR calc Af Amer: >60 mL/min (ref >60)
GFR calc non Af Amer: 52 mL/min — ABNORMAL LOW (ref >60)
Potassium: 4.6 mEq/L (ref 3.5–5.1)
Sodium: 134 mEq/L — ABNORMAL LOW (ref 135–145)

## 2011-03-16 LAB — PROTIME-INR
INR: 1.66 — ABNORMAL HIGH (ref 0.00–1.49)
Prothrombin Time: 19.8 seconds — ABNORMAL HIGH (ref 11.6–15.2)

## 2011-03-16 NOTE — Telephone Encounter (Signed)
I spoke with daughter today. Pt is in hospital at [facility] in heart block.  Pt is to get a pacemaker on Monday 3/26.  Daughter was concerned when pt was to see Dr. Daleen Squibb and other questions brought up by hospitalist. I told her a follow-up appt would be made at discharge for pt.  She will discuss with attending and I will forward to Dr. Daleen Squibb that pt is to receive pacemaker.

## 2011-03-17 LAB — GLUCOSE, CAPILLARY
Glucose-Capillary: 99 mg/dL (ref 70–99)
Glucose-Capillary: 195 mg/dL — ABNORMAL HIGH (ref 70–99)

## 2011-03-17 LAB — PROTIME-INR: Prothrombin Time: 25 seconds — ABNORMAL HIGH (ref 11.6–15.2)

## 2011-03-18 LAB — GLUCOSE, CAPILLARY: Glucose-Capillary: 140 mg/dL — ABNORMAL HIGH (ref 70–99)

## 2011-03-18 LAB — PROTIME-INR
INR: 3.01 — ABNORMAL HIGH (ref 0.00–1.49)
Prothrombin Time: 31.3 seconds — ABNORMAL HIGH (ref 11.6–15.2)

## 2011-03-19 ENCOUNTER — Ambulatory Visit (HOSPITAL_COMMUNITY)
Admission: RE | Admit: 2011-03-19 | Discharge: 2011-03-19 | Disposition: A | Discharge status: Home / Self Care | Payer: Medicare Other | Source: Ambulatory Visit | Attending: Internal Medicine | Admitting: Internal Medicine

## 2011-03-19 DIAGNOSIS — I441 Atrioventricular block, second degree: Secondary | ICD-10-CM | POA: Insufficient documentation

## 2011-03-19 DIAGNOSIS — R55 Syncope and collapse: Secondary | ICD-10-CM | POA: Insufficient documentation

## 2011-03-19 LAB — PROTIME-INR
INR: 2.62 — ABNORMAL HIGH (ref 0.00–1.49)
Prothrombin Time: 28.1 seconds — ABNORMAL HIGH (ref 11.6–15.2)

## 2011-03-20 ENCOUNTER — Inpatient Hospital Stay (HOSPITAL_COMMUNITY): Payer: Medicare Other

## 2011-03-20 LAB — CBC
HCT: 41.7 % (ref 39.0–52.0)
Hemoglobin: 13.7 g/dL (ref 13.0–17.0)
MCHC: 32.9 g/dL (ref 30.0–36.0)
MCV: 92.5 fL (ref 78.0–100.0)
RDW: 13.6 % (ref 11.5–15.5)
WBC: 6.4 10*3/uL (ref 4.0–10.5)

## 2011-03-20 LAB — GLUCOSE, CAPILLARY
Glucose-Capillary: 176 mg/dL — ABNORMAL HIGH (ref 70–99)
Glucose-Capillary: 152 mg/dL — ABNORMAL HIGH (ref 70–99)
Glucose-Capillary: 148 mg/dL — ABNORMAL HIGH (ref 70–99)
Glucose-Capillary: 129 mg/dL — ABNORMAL HIGH (ref 70–99)

## 2011-03-20 LAB — BASIC METABOLIC PANEL
CO2: 23 mEq/L (ref 19–32)
Chloride: 106 mEq/L (ref 96–112)
Creatinine, Ser: 1.24 mg/dL (ref 0.4–1.5)
GFR calc Af Amer: >60 mL/min (ref >60)
Potassium: 4.1 mEq/L (ref 3.5–5.1)

## 2011-03-21 ENCOUNTER — Telehealth: Payer: Self-pay | Admitting: Internal Medicine

## 2011-03-21 ENCOUNTER — Encounter: Payer: Self-pay | Admitting: Cardiology

## 2011-03-21 DIAGNOSIS — I82409 Acute embolism and thrombosis of unspecified deep veins of unspecified lower extremity: Secondary | ICD-10-CM | POA: Insufficient documentation

## 2011-03-21 DIAGNOSIS — Z7901 Long term (current) use of anticoagulants: Secondary | ICD-10-CM | POA: Insufficient documentation

## 2011-03-21 HISTORY — DX: Acute embolism and thrombosis of unspecified deep veins of unspecified lower extremity: I82.409

## 2011-03-21 NOTE — Telephone Encounter (Signed)
Pt needs to speak to nurse concerning pt medications

## 2011-03-21 NOTE — Telephone Encounter (Signed)
I spoke with Carla,the patient's daughter and she advised me that he was discharged from the hospital last night s/p pacer insertion. She states that he was sent home on Coumadin and ASA 325mg  and wonders if he should be taking baby ASA 81mg  instead because that is the dose he was taking while inpatient. I advised her that I would look into this medication issue and call her back. Spoke with Dr.Klein and he advised that he should not be taking any Asa while on Coumadin. I called Carla back and advised her of this.

## 2011-03-21 NOTE — Discharge Summary (Signed)
NAME:  Zachary Harris, Zachary Harris NO.:  [id number]  MEDICAL RECORD NO.:  [id number]           PATIENT TYPE:  I  LOCATION:  1436                         FACILITY:  [facility]  PHYSICIAN:  Isidor Holts, M.D.  DATE OF BIRTH:  [birth year]  DATE OF ADMISSION:  03/09/2011 DATE OF DISCHARGE:  03/20/2011                              DISCHARGE SUMMARY   PRIMARY MD:  Corwin Levins, MD  PRIMARY CARDIOLOGIST:  Jesse Sans. Daleen Squibb, MD, [facility].  DISCHARGE DIAGNOSES: 1. Presyncope/syncope, multifactorial. 2. Fall, secondary to presyncope/syncope.  No bony injuries. 3. Dehydration/orthostasis, contributory to presyncope/syncope. 4. Medication side effect, i.e. ACE inhibitor, beta blocker, and     Cymbalta, contributory to presyncope/syncope. 5. Acute renal insufficiency, resolved. 6. Second-degree heart block, status post permanent pacer March 19, 2011. 7. History of hypertension. 8. Peripheral vascular disease. 9. Coronary artery disease, status post coronary artery bypass     grafting in 2007. 10.History of prostate cancer. 11.Type 2 diabetes mellitus. 12.Dyslipidemia. 13.History of osteoarthritis, status post knee replacement. 14.Bilateral acute lower extremity deep vein thrombosis, March 2012,     now on anticoagulation.  DISCHARGE MEDICATIONS: 1. Acetaminophen 650 mg p.o. p.r.n. q.6h. for pain. 2. Coumadin per INR, currently on 2.5 mg p.o. at 6 p.m. daily (target     INR 2-3). 3. Actos 15 mg p.o. every evening. 4. Aspirin enteric-coated 325 mg p.o. daily. 5. Detrol LA 4 mg p.o. daily. 6. Fenofibrate 160 mg p.o. daily. 7. Fosamax 70 mg p.o. every week, on Saturdays. 8. Lipitor 40 mg p.o. daily.  Note: The following medications have been discontinued: 1. Amaryl. 2. Cymbalta. 3. Lisinopril. 4. Metformin. 5. Metoprolol XL succinate.  PROCEDURES: 1. Pelvic x-ray March 09, 2011.  This showed no acute osseous     abnormality.  There was lumbar spondylosis and  atherosclerosis. 2. X-ray lumbar spine March 09, 2011, this showed chronic T12 and L4     compression fractures.  No acute osseous abnormality.  Spondylosis     without significant interval change from September 10, 2009. 3. Chest x-ray March 09, 2011.  This shows a low-volume chest without     acute cardiopulmonary disease.  Changes of CABG are noted. 4. Chest x-ray March 20, 2011.  Report is still pending at the time of     this dictation. 5. A 2-D echocardiogram March 10, 2011.  This showed left ventricular     cavity size at the upper limits of normal.  Wall thickness was     normal except for mild hypertrophy of the basilar septum.  Systolic     function was mildly-to-moderately reduced.  Estimated ejection     fraction of 40% to 45%.  There was akinesis of the posterior     myocardium.  Aortic valve cusp separation was mildly reduced.     There was very mild stenosis, trivial regurgitation.  Mitral valve     had a moderately-to-severely calcified annulus, mild ventricular     defects, mild regurgitation.  Left atrium was moderately dilated.     Right atrium was moderately dilated. 6. Bilateral lower extremity  venous Doppler March 12, 2011.  This     showed acute deep vein thrombosis involving bilateral lower     extremities.  No evidence of superficial thrombosis.  Evidence of     Baker cyst on right-to-left.  CONSULTATIONS: 1. Noralyn Pick. Eden Emms, MD, [facility], cardiologist. 2. Duke Salvia, MD, [facility], cardiologist.  ADMISSION HISTORY:  As in the H and P notes of March 09, 2011, dictated by Dr. Kathlen Mody.  However, in brief, this is an 77 year old male, with known history of hypertension, dyslipidemia, type 2 diabetes mellitus, coronary artery disease, status post CABG in 2007; history of prostate cancer, peripheral vascular disease, status post previous knee replacement, presenting with a 2-week history of postural dizziness, for which he went to see his primary MD, Dr. Oliver Barre, where he was found to be orthostatic in the office.  His lisinopril dose was lowered from 40 mg p.o. daily to 20 mg p.o. daily.  However, the patient remained symptomatic.  In the night prior to presentation, the patient got up from his chair, felt dizzy and then passed out for a few seconds.  Same symptoms recurred the very next day and was associated with a fall.  He was brought to the emergency department, where he was found to be markedly orthostatic with the systolic blood pressure 133 in the lying position, dropping to 90 mmHg on standing.  He was subsequently admitted for further evaluation, investigation, and management.  CLINICAL COURSE: 1. Orthostasis.  This was multifactorial, was deemed secondary to     dehydration.  The patient did have a BUN of 39 with a creatinine of     1.64 at the time of presentation, consistent with dehydration and     acute renal insufficiency.  It was also felt that the patient's     medications were contributory, including his ACE inhibitor, beta     blocker, and Cymbalta.  These culprit medications were     discontinued.  He was managed with intravenous fluids, and over the     course of his hospitalization, he became asymptomatic.  2. Dehydration/acute renal insufficiency.  For details, refer to #1     above.  The patient's BUN and creatinine normalized, and as of     March 12, 2011, BUN was 20, creatinine 1.14.  Intravenous fluids     were discontinued on March 15, 2011.  Creatinine as of March 20, 2011, was 1.24.  3. Presyncope/syncope.  These were the presenting symptoms and are     deemed secondary to the factors mentioned in #1 above.  He had no     further episodes during the course of this hospitalization.  4. Fall.  This was secondary to orthostasis and dehydration/acute     renal insufficiency, and presyncope/syncope as above.  Fortunately,     imaging studies revealed no evidence of bony injuries.  5. History of  hypertension.  As described above, the patient's     antihypertensive medications were discontinued because of his     presenting symptomatology.  His diet was also liberalized to allow     for greater salt intake.  He has remained normotensive.  6. History of prostate carcinoma.  The patient showed no symptoms of     prostatism during the course of this hospitalization.  7. Type 2 diabetes mellitus.  The patient was managed with Actos and     appropriate diet and remained euglycemic during the course of  this     hospitalization.  His metformin has been discontinued secondary to     age, Amaryl has also been discontinued as the patient is euglycemic, on     Actos monotherapy alone.  8. Dyslipidemia.  The patient continues on statin treatment.  9. Bilateral lower extremity DVT.  The patient clinically was found to     have increased circumference of the right lower extremity,     necessitating ordering bilateral lower extremity venous Dopplers.     This was carried out on March 12, 2011 and revealed bilateral acute     lower extremity DVT.  He has since been placed on Coumadin     anticoagulation.  As of March 20, 2011, his INR was therapeutic at     2.44.  Lovenox has since been discontinued.  The patient will     be followed up at the [facility] and is recommended to have     his office visit on March 23, 2011.  10.Coronary artery disease.  The patient remained asymptomatic from     this viewpoint.  11.Second-degree AV block.  The patient after the first few days of     hospitalization, was noted to have pauses on telemetric monitoring,     necessitating calling a cardiology consultation, which was provided     by Dr. Sherryl Manges on March 16, 2011.  He was found to have     significant arrhythmia with high-grade heart block with     consecutively dropped P-waves in the setting of syncope.  Pacemaker     placement was recommended.  This was carried out as an      uncomplicated procedure on March 19, 2011.  DISPOSITION:  The patient as of March 20, 2011, was asymptomatic.  There were no new issues.  He was considered clinically stable for discharge and therefore discharged accordingly.  ACTIVITY:  As tolerated.  Recommended to increase activity slowly.  DIET:  Carbohydrate modified.  The patient is allowed to liberalize his salt intake, given his recent history of orthostasis.  FOLLOWUP INSTRUCTIONS:  The patient is to follow up with his primary MD, Dr. Oliver Barre, in the coming week.  He has been instructed to call for an appointment.  In addition, he is to follow up routinely with his primary cardiologist, Dr. Valera Castle, per prior scheduled appointment and follow up with the [facility] next week.  He is instructed to call for an appointment.  In addition, the office will call the patient to schedule time and date of appointment.  SPECIAL INSTRUCTIONS:  The patient has been recommended to attend the [facility] in the a.m. of March 23, 2011, for PT/INR check. Timing of further followup as well as modifications of Coumadin dosage if indicated, will be established at that time.  In addition, home health PT/OT has been arranged.     Isidor Holts, M.D.     CO/MEDQ  D:  03/20/2011  T:  03/20/2011  Job:  [id number]  cc:   Corwin Levins, MD [address] [place] [address]  Jesse Sans. Wall, MD, FACC [address]  [address] [place] [address]  Duke Salvia, MD, [facility] [address]  [address] [place] [address]  Electronically Signed by Isidor Holts M.D. on 03/21/2011 02:17:21 PM

## 2011-03-23 ENCOUNTER — Ambulatory Visit (INDEPENDENT_AMBULATORY_CARE_PROVIDER_SITE_OTHER): Payer: Medicare Other | Admitting: *Deleted

## 2011-03-23 DIAGNOSIS — I82409 Acute embolism and thrombosis of unspecified deep veins of unspecified lower extremity: Secondary | ICD-10-CM

## 2011-03-23 DIAGNOSIS — Z7901 Long term (current) use of anticoagulants: Secondary | ICD-10-CM

## 2011-03-23 NOTE — Patient Instructions (Addendum)
  INR 2.0 Begin taking 1 tablet daily, except take 2 tablets on Mondays, Wednesdays, and Fridays. Recheck in 1 week.

## 2011-03-23 NOTE — Progress Notes (Signed)
Initiation of coumadin in hospital on 03/13/11  7.5mg      03/14/11     7.5mg    1.12 03/15/11     7.5mg    1.27 03/16/11     7.5mg     1.66 03/17/11     7.5mg     2.25 03/18/11     0             3.01 03/19/11      2.5mg     2.62 03/20/11      2.5 mg    2.44 03/21/11      2.5 mg     03/22/11      2.5 mg 03/23/11                     2.0

## 2011-03-27 ENCOUNTER — Telehealth: Payer: Self-pay | Admitting: Cardiology

## 2011-03-27 DIAGNOSIS — G629 Polyneuropathy, unspecified: Secondary | ICD-10-CM

## 2011-03-27 HISTORY — DX: Polyneuropathy, unspecified: G62.9

## 2011-03-27 NOTE — Progress Notes (Signed)
NAME:  Zachary Harris, Zachary Harris NO.:  [id number]  MEDICAL RECORD NO.:  [id number]           PATIENT TYPE:  I  LOCATION:  1436                         FACILITY:  [facility]  PHYSICIAN:  Kathlen Mody, MD       DATE OF BIRTH:  08-28-1923                                PROGRESS NOTE   PRIMARY CARE PHYSICIAN: Corwin Levins, MD  CARDIOLOGIST: Dr. [place].  ELECTROPHYSIOLOGY CONSULT: Duke Salvia, M.D.  SUBJECTIVE HISTORY: This is an 77 year old gentleman who was admitted for recurrent episodes of syncope secondary to orthostatic hypotension.  No complaints overnight.  The patient's vitals today, temperature is 98.4, pulse of 84, respirations 18, blood pressure 140/70, and saturating 92-94% on room air.  Orthostatics; blood pressure while lying down 140/70 with a pulse of 84, blood pressure when standing up is 88/56 with a pulse of 95.  PHYSICAL EXAMINATION: GENERAL:  The patient is sleeping comfortably in no acute distress. CARDIOVASCULAR:  S1-S2 heard.  The patient has a systolic murmur. Regular rate and rhythm. RESPIRATORY:  Good air entry bilaterally.  No wheezes or rhonchi. ABDOMEN:  Soft, nontender, and nondistended.  Good bowel sounds. EXTREMITIES:  No pedal edema.  Calf tenderness present.  PERTINENT LABS: As of today, INR of 3.01.  Basic metabolic panel showing a sodium of 134, potassium of 4.6, chloride of 104, bicarb of 22, glucose of 144, BUN of 34, and creatinine of 1.3.  CBC within normal limits.  HOSPITAL COURSE BY PROBLEM LIST: This is an 77 year old gentleman with a history of coronary artery disease, status post CABG in 2007 and hypertension and was on beta- blockers, hyperlipidemia, diabetes mellitus, prostate cancer, peripheral vascular disease who is admitted for recurrent syncope secondary to orthostatic hypotension, and the patient was found to have high-grade AV block. 1. Syncope and orthostatic hypotension, symptomatic.  Blood pressure  medications have been stopped.  He is on fall precautions.  Syncope     could most likely be secondary to orthostatic hypotension secondary     to high degree AV block versus being on Cymbalta. 2. Second degree AV block and high-grade block with dropped T waves.     The patient is going for a permanent pacemaker on Monday. 3. Bilateral lower extremity popliteal DVT, on Lovenox and Coumadin.     The patient's INR is 3.01.  We will hold the dose of Coumadin     tonight for the INR to be less than 2.8 for the pacemaker placement     tomorrow. 4. Diabetes.  On admission, the patient's oral metformin was stopped     secondary to renal insufficiency.  The patient is on Actos 30 mg     daily.  The patient's oral hypoglycemics glimepiride was stopped.     At this time, the patient is on NovoLog sliding scale and Actos 30     mg daily. 5. Hyperlipidemia.  The patient is on Lipitor, continue with the same.     Continue with fenofibrate of 160 mg daily. 6. Prostate cancer/BPH.  The patient is on Detrol 4 mg p.o. daily. 7. Anxiety/depression.  Initially over  few weeks prior to the     admission, the patient was started on Cymbalta and the patient and     the family attribute Cymbalta to his generalized weakness and his     symptoms of dizziness and orthostatic hypotension.  Hence, Cymbalta     was stopped.  The patient is not on any antidepressants at this     time. 8. DVT prophylaxis, on Lovenox and Coumadin.  DISPOSITION/PLAN: The patient will get a permanent pacemaker on Monday morning and he can be discharged in a day or two from then.          ______________________________ Kathlen Mody, MD     VA/MEDQ  D:  03/18/2011  T:  03/18/2011  Job:  [id number]  Electronically Signed by Kathlen Mody MD on 03/27/2011 10:03:16 PM

## 2011-03-28 ENCOUNTER — Ambulatory Visit (INDEPENDENT_AMBULATORY_CARE_PROVIDER_SITE_OTHER): Payer: Medicare Other | Admitting: *Deleted

## 2011-03-28 ENCOUNTER — Ambulatory Visit: Payer: Medicare Other | Admitting: Internal Medicine

## 2011-03-28 ENCOUNTER — Other Ambulatory Visit: Payer: Self-pay

## 2011-03-28 DIAGNOSIS — Z7901 Long term (current) use of anticoagulants: Secondary | ICD-10-CM

## 2011-03-28 DIAGNOSIS — I442 Atrioventricular block, complete: Secondary | ICD-10-CM

## 2011-03-28 DIAGNOSIS — I82409 Acute embolism and thrombosis of unspecified deep veins of unspecified lower extremity: Secondary | ICD-10-CM

## 2011-03-28 MED ORDER — WARFARIN SODIUM 5 MG PO TABS: 2.5000 mg | ORAL_TABLET | ORAL | Status: DC

## 2011-03-28 NOTE — Progress Notes (Signed)
Pacer check in clinic  

## 2011-03-28 NOTE — Telephone Encounter (Signed)
Please refill.

## 2011-03-29 ENCOUNTER — Encounter: Payer: Medicare Other | Admitting: *Deleted

## 2011-04-03 NOTE — Consult Note (Signed)
NAME:  Zachary Harris, Zachary Harris NO.:  [id number]  MEDICAL RECORD NO.:  [id number]           PATIENT TYPE:  O  LOCATION:  1436                         FACILITY:  [facility]  PHYSICIAN:  Noralyn Pick. Eden Emms, MD, FACCDATE OF BIRTH:  January 11, 1923  DATE OF CONSULTATION:  03/12/2011 DATE OF DISCHARGE:                                CONSULTATION   PRIMARY CARDIOLOGIST:  Jesse Sans. Daleen Squibb, MD, [facility]  PRIMARY CARE PHYSICIAN:  Corwin Levins, M.D.  REASON FOR CONSULTATION:  Decreased ejection fraction and orthostatic hypotension.  PAST MEDICAL HISTORY: 1. Coronary artery disease, status post five-vessel coronary artery     bypass grafting in 1997.  A.  Status post inferior ST elevation     myocardial infarction in July 26, 2006 with successful PCI and     stenting of the vein graft to the first obtuse marginal with 3.5 x     16 mm Liberte bare metal stent. 2. Hypertension. 3. Hyperlipidemia. 4. Non-insulin-dependent diabetes mellitus. 5. Peripheral vascular disease. 6. History of prostate cancer. 7. Anxiety. 8. Depression. 9. Status post bilateral knee surgery in 1990. 10.Status post cholecystectomy. 11.Status post cataract. 12.History of tobacco abuse.  HISTORY OF PRESENT ILLNESS:  This is an 77 year old gentleman with the above-stated problem list, who presented to the ER with complaints of dizziness and status post fall x2.  The patient states he was evaluated by Dr. Jonny Ruiz with complaints of dizziness on standing and at this time, his lisinopril was lowered from 40 mg to 20 mg daily.  The patient states that this did not improve his symptoms.  On the night prior to admission, patient states he stood from a chair and felt dizzy and passed out.  He had no complaints of chest pain, shortness of breath, fevers or chills at this time.  He had no postictal symptoms.  On the morning of admission, the scene occurred again.  In the emergency department, the patient was found to be  orthostatic with systolic blood pressure falling from 133 lying to 90 mmHg standing.  Patient's blood pressure medication was held.  The 2-D echocardiogram was obtained that did show an ejection fraction of 40%-45% with akinesis of the posterior myocardium.  This is consistent with known acute inferior ST elevation myocardial infarction in 2007.  Cardiology was consulted secondary to patient's ejection fraction decreasing for approximately 55% to 40%-45%.  Upon evaluation the patient, he states he has no further episodes of dizziness since admission.  He has since been hydrated as his labs on admission did suggest dehydration.  The patient still remains orthostatic, again, he denies any presyncopal symptoms.  He did have an episode of chest pain on evening that patient states was calf pain.  The pain was relieved with Maalox and nitroglycerin.  The 12-lead EKG was obtained that shows no acute changes.  Patient has a mild troponin leak of 0.10 and the CK and CK-MB remained normal.  Patient has no further episodes of chest pain.  Patient also denies any recent fevers/chills. He denies any lower extremity edema or palpitations.  Patient is currently resting comfortably in bed.  SOCIAL  HISTORY:  Patient lives at a friend's home with his wife.  He is a retired Designer, television/film set of a Management consultant.  He has two children.  He is a former smoker, but currently denies tobacco, alcohol or illicit drug use.  FAMILY HISTORY:  Pertinent for diabetes mellitus in his father. Noncontributory for coronary artery disease.  ALLERGIES:  No known drug allergies.  INPATIENT MEDICATIONS: 1. Aspirin 325 mg daily. 2. Lipitor 40 mg daily. 3. Lovenox 40 mg subcutaneously daily. 4. Fenofibrate 160 mg daily. 5. Sliding scale insulin. 6. Active 50 mg daily. 7. Detrol 4 mg daily.  REVIEW OF SYSTEMS:  All pertinent positives and negatives as stated in HPI.  All other systems have been reviewed and are  negative.  PHYSICAL EXAMINATION:  VITAL SIGNS:  Temperature 98.  Orthostatics: Lying 131/74, pulse 80:  Sitting 103/64, pulse 84:  Standing 105/70, pulse 114.  Respirations 20 and O2 saturation 93% on room air. GENERAL:  This is a well-developed, well-nourished elderly gentleman. He is in no acute distress. HEENT:  Normal. NECK:  Supple without bruit or JVD. HEART:  Regular rate and rhythm with S1, S2.  There is a soft systolic murmur heard at the left upper sternal border.  Pulses are 2+ and equal bilaterally. LUNGS:  Clear to auscultation bilaterally without wheezes, rales or rhonchi. ABDOMEN:  Soft, nontender, positive bowel sounds x4. EXTREMITIES:  No clubbing, cyanosis or edema. MUSCULOSKELETAL:  No joint deformities or effusions. NEUROLOGIC:  Alert and oriented x3.  Cranial nerves II through XII grossly intact.  DIAGNOSTIC STUDIES:  Chest x-ray showing low lung volume without acute cardiopulmonary process.  EKG showing sinus rhythm at a rate of 86 beats per minute.  There is a first-degree AV block with a PR interval at 212 milliseconds.  There are no acute ST-T-wave changes.  EKG is unchanged from prior tracing.  LABORATORY DATA:  Sodium 135, potassium 4.6, chloride 108, bicarb 22, BUN 20, creatinine 1.14, glucose 132.  Cholesterol 126, triglycerides 78, HDL 31, LDL 79.  CK, CK-MB negative x5.  Troponin 0.10 with negative x4.  ASSESSMENT AND PLAN:  This is an 76 year old gentleman with history of coronary artery disease, hypertension and hyperlipidemia, who presents with dizziness and since been found to have orthostatic hypotension. Patient's symptoms have improved with holding antihypertensive as well as hydration.  Dr. Eden Emms has reviewed patient's echocardiogram from March 10, 2011 which showed an ejection fraction of 40%-45%, also Dr. Eden Emms feels the ejection fraction was more like 50% with known inferior basal akinesis that is consistent with his history of  inferior myocardial infarction.  Patient also shows no signs of congestive heart failure as he is euvolemic on the exam and his BNP is less than 50.  Patient also has a chest pain yesterday that resolved with negative markers as well as no EKG changes. Therefore, no further cardiac workup is needed at this time.  Thank you for this evaluation.     Leonette Monarch, PA-C   ______________________________ Noralyn Pick Eden Emms, MD, [facility]    NB/MEDQ  D:  03/12/2011  T:  03/12/2011  Job:  [id number]  cc:   Thomas C. Wall, MD, FACC [address]  [address] [place] [address]  Corwin Levins, MD [address] [place] [address]  Electronically Signed by Alen Blew P.A. on 03/28/2011 10:45:19 AM Electronically Signed by Charlton Haws MD [facility] on 04/03/2011 12:03:21 PM

## 2011-04-04 ENCOUNTER — Ambulatory Visit (INDEPENDENT_AMBULATORY_CARE_PROVIDER_SITE_OTHER): Payer: Medicare Other | Admitting: *Deleted

## 2011-04-04 DIAGNOSIS — Z7901 Long term (current) use of anticoagulants: Secondary | ICD-10-CM

## 2011-04-04 DIAGNOSIS — I82409 Acute embolism and thrombosis of unspecified deep veins of unspecified lower extremity: Secondary | ICD-10-CM

## 2011-04-04 LAB — POCT INR: INR: 2.1

## 2011-04-04 NOTE — Patient Instructions (Addendum)
Continue taking 2 tablets on Monday, Wednesday, and Friday. And 1 tablet all other days. Recheck in 1 week.

## 2011-04-10 ENCOUNTER — Telehealth: Payer: Self-pay | Admitting: Cardiology

## 2011-04-11 ENCOUNTER — Ambulatory Visit (INDEPENDENT_AMBULATORY_CARE_PROVIDER_SITE_OTHER): Payer: Medicare Other | Admitting: *Deleted

## 2011-04-11 DIAGNOSIS — I951 Orthostatic hypotension: Secondary | ICD-10-CM

## 2011-04-11 DIAGNOSIS — Z7901 Long term (current) use of anticoagulants: Secondary | ICD-10-CM

## 2011-04-11 DIAGNOSIS — Z5189 Encounter for other specified aftercare: Secondary | ICD-10-CM

## 2011-04-11 DIAGNOSIS — I2510 Atherosclerotic heart disease of native coronary artery without angina pectoris: Secondary | ICD-10-CM

## 2011-04-11 DIAGNOSIS — I10 Essential (primary) hypertension: Secondary | ICD-10-CM

## 2011-04-11 DIAGNOSIS — I82409 Acute embolism and thrombosis of unspecified deep veins of unspecified lower extremity: Secondary | ICD-10-CM

## 2011-04-11 LAB — POCT INR: INR: 1.6

## 2011-04-11 MED ORDER — WARFARIN SODIUM 2.5 MG PO TABS: 2.5000 mg | ORAL_TABLET | ORAL | Status: DC

## 2011-04-11 NOTE — Telephone Encounter (Signed)
Rx requested sent electronically to [facility].  [facility] for pt to make aware.

## 2011-04-13 ENCOUNTER — Telehealth: Payer: Self-pay | Admitting: Cardiology

## 2011-04-13 NOTE — Telephone Encounter (Signed)
[facility] is calling re pt warfarin [facility] needs to speak to a nurse. Ref# [id number]

## 2011-04-13 NOTE — Telephone Encounter (Signed)
[facility] called to inform us that pt is on Fenofibrate and cymbalta before they dispense his warfarin.

## 2011-04-18 NOTE — Op Note (Signed)
  NAME:  KILEY, TORRENCE NO.:  [id number]  MEDICAL RECORD NO.:  [id number]           PATIENT TYPE:  O  LOCATION:  CATH                         FACILITY:  [facility]  PHYSICIAN:  Duke Salvia, MD, FACCDATE OF BIRTH:  07-13-1923  DATE OF PROCEDURE:  03/19/2011 DATE OF DISCHARGE:  03/19/2011                              OPERATIVE REPORT   PREOPERATIVE DIAGNOSIS:  Syncope with second-degree entire grade heart block.  POSTOPERATIVE DIAGNOSIS:  Syncope with second-degree entire grade heart block.  PROCEDURE:  Dual-chamber pacemaker implantation.  Following obtaining informed consent, the patient was brought to the electrophysiology laboratory and placed on the fluoroscopic table in supine position.  After routine prep and drape of the left upper chest, lidocaine was infiltrated in the prepectoral subclavicular region. Incision was made and carried down to layer of the prepectoral fascia using electrocautery and sharp dissection.  The pocket was formed similarly.  Hemostasis was obtained.  Thereafter attention was turned gain access to extrathoracic left subclavian vein, which was accomplished without difficulty without aspiration of air or puncture of the artery.  Two separate venipunctures were accomplished.  Guidewires were placed and retained and sequentially 7-French sheaths were placed, which were passed a St. Jude 2088 TC active fixation ventricular lead, serial number [id number] and a St. Jude 2088 TC active fixation atrial lead, serial number [id number]. Under fluoroscopic guidance, these were manipulated to the right ventricular apex in the right atrial appendage remnant respectively where bipolar R-wave was 10.1 with pace impedance of 831, threshold of 1 volt at 0.4 milliseconds.  Current at 5 volts was 1.2 mA.  Current of injury was brisk.  There was no diaphragmatic pacing at 10 volts.  The bipolar P-wave was 1.6 with pace impedance of 41, a threshold  1.7 volts at 0.4 milliseconds shortly after screw deployment.  Current at 5 volts at 3.2 MA.  The current of injury was "brisk."  The ventricular lead was marked with a tie prior to insertion of the atrial lead.  The leads were then secured to the prepectoral fascia and attached to an [employer] RF pulse generator model PM 2210, serial number [id number].  T- synchronous pacing was identified.  The pocket was copiously irrigated with antibiotic-containing saline solution.  Hemostasis was assured. Leads and pulse generator were placed in the pocket and secured to the prepectoral fascia.  Surgicel was placed on the posterior aspect and the cephalad aspect of the pocket.  The wound was closed in three layers in normal fashion.  The wound was washed, dried and Benzoin and Steri-Strip dressing was applied. Needle counts, sponge counts, and instrument counts were corrected at the end of the procedure according to the staff.  The patient tolerated procedure without apparent complication apart from urinary urgency, which was finally able to relieve by urination.     Duke Salvia, MD, [facility]     SCK/MEDQ  D:  03/19/2011  T:  03/20/2011  Job:  [id number]  Electronically Signed by Sherryl Manges MD [facility] on 04/18/2011 08:47:51 AM

## 2011-04-18 NOTE — Consult Note (Signed)
NAME:  Zachary Harris NO.:  [id number]  MEDICAL RECORD NO.:  [id number]           PATIENT TYPE:  I  LOCATION:  1436                         FACILITY:  [facility]  PHYSICIAN:  Duke Salvia, MD, FACCDATE OF BIRTH:  October 29, 1923  DATE OF CONSULTATION:  03/16/2011 DATE OF DISCHARGE:                                CONSULTATION   Thank you very much for asking me to see Zachary Harris in consultation because of syncope and high-grade heart block.  HISTORY OF PRESENT ILLNESS:  The patient is an 77 year old gentleman with known coronary disease with prior myocardial infarction in the 90s treated with bypass surgery and stenting and mild depression of left ventricular function, who also has diabetes with neuropathic symptoms, who presented to the hospital a week ago with recurrent syncope.  Two weeks prior to this, he had been begun on Cymbalta because of his diabetic neuropathy.  The family identifies that following the initiation of that medication, there was progressive weakness.  It included anorexia, orthostasis, generalized malaise.  He saw his primary care physician a week or two into this.  He was found to be hypotensive and his lisinopril dose was cut in half and the Cymbalta was continued. A couple of days later, he had his first of three syncopal episodes. The stories of these syncopal episodes are not entirely clear.  They all sound like they occurred while he was standing and using his walker. The length of the prodrome is not clearly short or long.  The length of the recovery phase is also not clearly short or long.  During this period of time, he also be claimed very anorectic and upon presentation to the hospital, was felt to be quite dehydrated with a BUN and creatinine of 39/1.6.  He was treated with rehydration.  While being monitored, he was also found to have episodes of high-grade heart block.  Some of these were associated with using the  restroom, other of them were not.  There were number of episodes of non-conducted P waves.  The patient denies prior syncope, palpitations, or lightheadedness. Exercise tolerance has been relatively stable.  Since having been hospitalized, he was found to have, by echocardiogram, an ejection fraction of about 40% to 45% with mild mitral regurgitation. He has remained persistently orthostatic, although the symptoms have abated to some degree.  He was also found on Doppler evaluation to have bilateral lower extremity venous thrombosis that were described as in the popliteals.  He was begun on heparin and Coumadin.  PAST MEDICAL HISTORY:  In addition to the above is notable for, 1. Hyperlipidemia. 2. Prostate cancer. 3. Peripheral vascular disease. 4. Anxiety/depression. 5. Remote tobacco.  PAST SURGICAL HISTORY:  Notable for bilateral knee surgery, cholecystectomy, cataract, bypass surgery.  FAMILY HISTORY:  Notable for diabetes, otherwise noncontributory.  SOCIAL HISTORY:  He lives in [place] with his wife.  He has 2 children.  REVIEW OF SYSTEMS:  Apart from the above is negative apart from being hard-of-hearing and having decreased vision.  PHYSICAL EXAMINATION:  GENERAL:  He is an elderly Caucasian male, lying in bed, apparently  on bedrest.  He was in no acute distress.  He was somewhat disheveled. VITAL SIGNS:  He was orthostatic yesterday with blood pressure falling from 111 to 89, this was not with prolonged standing.  Heart rates would range from the 80s to the 110s, but have been better over the last 24 hours.  Salt has been given to his diet. HEENT:  He had hearing aids and glasses on.  His HEENT exam was otherwise normal. NECK:  Veins were flat.  Carotids were brisk and full bilaterally without bruits. BACK:  Kyphosis, but no scoliosis. LUNGS:  Clear. HEART:  Rhythm was normal with a 2/6 systolic murmur.  There was no S4. ABDOMEN:  Soft with active  bowel sounds.  There was no HJR. EXTREMITIES:  Femoral pulses were 2+, distal pulses were intact.  There was no clubbing, cyanosis, or edema.  There was a venotomy scar. NEUROLOGIC:  Grossly normal apart from the difficulty hearing. SKIN:  Warm and dry.  DIAGNOSTIC STUDIES:  Telemetry was notable as described above. Electrocardiogram dated this morning demonstrated sinus rhythm with first-degree AV block with intervals of 0.22/0.10/0.39, the rate was 81. Laboratories were as noted with an INR of 1.66.  BUN and creatinine were still obviously at 34 over 1.3.  IMPRESSION: 1. Recurrent syncope with standing. 2. Orthostatic hypotension, relatively acutely symptomatic. 3. Cymbalta therapy associated with orthostatic hypotension. 4. Second-degree AV block and higher-grade heart block with     consecutively dropped P waves, question relationship to recurrent     syncope. 5. Coronary artery disease with,     a.     Prior myocardial infarction.     b.     Prior coronary artery bypass graft.     c.     Ejection fraction of 40% to 45%. 6. Diabetes with neuropathy. 7. Hypertension. 8. Prostate cancer. 9. Bilateral popliteal deep vein thrombosis on low-molecular-weight     heparin and Coumadin.  DISCUSSION:  Zachary Harris has a significant arrhythmia issue with high- grade heart block with a consecutively dropped P waves in the setting of syncope.  I think the syncope is probably, however, not related to the arrhythmia, but still the arrhythmia is appropriately treated with pacing given its high-grade nature.  I have reviewed this with the family including potential benefits and risks including, but not limited to perforation, infection, and lead dislodgement.  They understand these risks and are willing to proceed.  The timing of this should be following the therapeutic INR and while he is being anticoagulated for his DVT, we would want to avoid pacing, if at all, possible so as to minimize  the likelihood of pocket hematoma.  On Coumadin, it is not nearly as big an issue.  The bigger issue I think is the orthostatic hypotension.  I suspect that given his vascular disease, his diabetes and neuropathy, that he has underlying orthostatic intolerance, which has become acutely symptomatic likely triggered by the Cymbalta.  This was further aggravated by dehydration.  The Cymbalta has been removed, rehydration is important. I think the other thing is key is not to have him at bedrest, being up as much as he can be even if just in a chair is a key for the treatment of orthostatic hypotension.  In addition, we want to use reversed Trendelenburg.  Other therapeutic maneuvers that could be considered down the road would include ProAmatine, Florinef, support stockings, and certainly the need for antihypertensive maneuvers, i.e., isometric exercise prior to or with  standing.  Thank you for the consultation.  We will plan to tentatively undertake pacing on Monday based on his INR as well as aggressively treat his orthostatic hypotension over the weekend.     Duke Salvia, MD, [facility]     SCK/MEDQ  D:  03/16/2011  T:  03/17/2011  Job:  [id number]  Electronically Signed by Sherryl Manges MD [facility] on 04/18/2011 08:47:53 AM

## 2011-04-24 DIAGNOSIS — R5381 Other malaise: Secondary | ICD-10-CM

## 2011-04-24 DIAGNOSIS — IMO0001 Reserved for inherently not codable concepts without codable children: Secondary | ICD-10-CM

## 2011-04-24 HISTORY — DX: Other malaise: R53.81

## 2011-04-24 HISTORY — DX: Reserved for inherently not codable concepts without codable children: IMO0001

## 2011-04-25 ENCOUNTER — Ambulatory Visit (INDEPENDENT_AMBULATORY_CARE_PROVIDER_SITE_OTHER): Payer: Medicare Other | Admitting: *Deleted

## 2011-04-25 DIAGNOSIS — Z7901 Long term (current) use of anticoagulants: Secondary | ICD-10-CM

## 2011-04-25 DIAGNOSIS — I82409 Acute embolism and thrombosis of unspecified deep veins of unspecified lower extremity: Secondary | ICD-10-CM

## 2011-04-25 LAB — POCT INR: INR: 1.7

## 2011-05-08 ENCOUNTER — Other Ambulatory Visit: Payer: Self-pay | Admitting: Cardiovascular Disease

## 2011-05-08 DIAGNOSIS — I739 Peripheral vascular disease, unspecified: Secondary | ICD-10-CM

## 2011-05-08 NOTE — Assessment & Plan Note (Signed)
[facility]                            CARDIOLOGY OFFICE NOTE   RODERIC, LAMMERT                      MRN:          [id number]  DATE:05/03/2008                            DOB:          1923/11/13    Ms. Crehan returns today for management of his coronary disease.  He is  having no angina or significant dyspnea on exertion.   [facility] he discontinued it.  Please see  my previous note.   MEDICATIONS:  He is currently on  1. Metoprolol extended release 25 mg per day.  2. Simvastatin 40 mg a day.  3. Tricor 145 mg a day.  4. Aspirin 325 a day.  5. Iron 325 mg a day.  6. Uroxatral 10 mg a day.  7. Avodart 0.5 mg a day.  8. Lisinopril 40 mg day.  9. He carries sublingual nitroglycerin.   He denies any orthopnea, PND or peripheral edema.   PHYSICAL EXAMINATION:  VITAL SIGNS:  His blood pressure today is 137/63,  pulse 60 and regular.  Electrocardiogram is normal except for some poor  R-wave progression across the anterior precordium.  This is stable.  HEENT:  Unchanged.  NECK:  Carotids are full.  There is a soft systolic sound at the right  carotid.  Thyroid is not enlarged.  Trachea is midline.  LUNGS:  Clear.  HEART:  Soft S1-S2.  No murmur, rub or gallop.  ABDOMEN:  Soft, good bowel sounds.  No midline bruit.  EXTREMITIES:  No cyanosis, clubbing or edema.  Pulses are intact.   Looking back in his chart on September 28, 2005, he had carotid Dopplers  which showed no obstructive plaque.  He had antegrade flow in both  vertebrals.   His last stress Myoview was August 15, 2006.  EF was 56%.  No  significant ischemia.  Prior inferior wall infarct.   I have had a long talk with Mr. Wenger today.  We will continue his  current medical program.  I do not feel strongly he has to be on Plavix  at this time.  Certainly this is consistent with the guidelines.  It  would just cost him way too much money.   I will plan  on seeing him back again in six months.     Thomas C. Daleen Squibb, MD, [facility]  Electronically Signed    TCW/MedQ  DD: 05/03/2008  DT: 05/03/2008  Job #: [id number]

## 2011-05-08 NOTE — Assessment & Plan Note (Signed)
[place] [facility]                            CARDIOLOGY OFFICE NOTE   JARQUAVIOUS, FENTRESS                      MRN:          [id number]  DATE:11/03/2007                            DOB:          1923/04/17    Mr. Kiedrowski comes in today carrying a [facility] about  changing his medications. He also has blood work.   Specifically, [facility] has told him there is no indication for Plavix  this far out from his infarct. I concur with him but told him he has  very diffuse disease, particularly in his right coronary artery, and has  disease in his bypass grafts, and I would recommend lifelong Plavix if  he can afford it. He says he can. He is bothered by bruising. I have  told him he can decrease his aspirin to 81 mg a day.   They also made it clear that TriCor was not available at the [facility] They  recommended Lopid. I told him this was a potentially serious problem  with drug/drug interaction, and I would not recommend that change. He  was fine with this as well.   He was also told to stop his Actos and metformin but was not given an  alternative. His creatinine is 1.8. I told him Dr. Jonny Ruiz would have to  answer this question, though Actos alone may be his solution. His  hemoglobin A1c was less than 6.   He also asked me about Vesicare, Uroxatral and also Avodart. I asked him  to Dr. Bjorn Pippin about this.   He is doing well from a clinical standpoint. His blood pressure is  104/60, his pulse 57 and regular. [facility] from [facility] looked  good except for a creatinine of 1.8. Again, hemoglobin A1c was  remarkably good, 5.7%.   Again, I spent about 20 minutes going through the [facility] requests and his  drug list. I made the following recommendations:  1. Discontinue Foltx which has not been shown to be clinically useful      at this point.  2. Continue Plavix.  3. Decrease aspirin to 18 mg a day.  4. Stay on TriCor and not use Lopid.  5.  He will follow up with Dr. Jonny Ruiz concerning his diabetic medication      and Dr. Annabell Howells concerning his urological medications. I will see him      back in 6 months.     Thomas C. Daleen Squibb, MD, [facility]  Electronically Signed    TCW/MedQ  DD: 11/03/2007  DT: 11/04/2007  Job #: [id number]

## 2011-05-08 NOTE — Assessment & Plan Note (Signed)
[facility]                            CARDIOLOGY OFFICE NOTE   Zachary Harris, Zachary Harris                      MRN:          [id number]  DATE:02/09/2009                            DOB:          1923-03-05    Zachary Harris comes in today for further management of his coronary artery  disease.   PROBLEM LIST:  1. Coronary artery disease.  He is status post coronary artery bypass      grafting.  He had acute inferior wall infarct in August 2007; at      which time, a catheterization showed an acute plaque rupture in the      vein graft to the obtuse marginal branch, which was collateralized      in the right coronary artery.  There was a chronic total occlusion      of the vein graft to the right coronary artery.  There was a      severely diseased native right coronary artery with slow antegrade      flow and severe disease of the left coronary artery system as well.      His mammary to the LAD was patent.  He had a significant narrowing      in the vein graft to a diagonal branch of about 70%.  He      subsequently underwent a stress Myoview, which showed an EF of 56%      with inferior wall infarct with a very mild peri-infarct ischemia      towards the apex, but the lateral wall looked stable.  Dr. Riley Kill      and I decided to treat him medically which in retrospect he has      done well.  2. Type 2 diabetes.  3. Hypertension.  4. Hyperlipidemia.   He is having no angina.  He had 1 episode of discomfort in his chest  associated with burping and belching that he did take one nitroglycerin  for it, did not seem to help.  He does have some reflux symptoms.   MEDICATIONS:  1. Metoprolol extended release 25 mg per day.  2. Simvastatin 40 mg a day.  3. TriCor 145 mg per day.  4. Aspirin 325 mg a day.  5. Uroxatral 10 mg per day.  6. Avodart 0.5 mg daily.  7. Lisinopril 40 mg a day.  8. VESIcare 5 mg a day.  9. Nitroglycerin p.r.n..   PHYSICAL  EXAMINATION:  GENERAL:  He is in no acute distress.  He is  alert and oriented x3.  SKIN:  Warm and dry.  VITAL SIGNS:  His blood pressure is 98/60, his pulse 72 and regular, and  his weight is 206 and stable.  HEENT:  Unchanged.  NECK:  Carotid upstrokes were equal bilaterally without bruits.  No JVD.  Thyroid is not enlarged.  Trachea is midline.  LUNGS:  Clear.  HEART:  A nondisplaced PMI.  Normal S1 and S2.  No gallop.  ABDOMEN:  Soft, good bowel sounds.  No midline bruits.  EXTREMITIES:  No cyanosis, clubbing, or edema.  Pulses were  present, but  reduced.  NEURO:  Intact.   Zachary Harris is stable from my standpoint.  I have made no changes in his  medical program.  We will plan on seeing him back again in 6 months.     Thomas C. Daleen Squibb, MD, [facility]  Electronically Signed    TCW/MedQ  DD: 02/09/2009  DT: 02/10/2009  Job #: [id number]

## 2011-05-08 NOTE — Assessment & Plan Note (Signed)
[place] [facility]                            CARDIOLOGY OFFICE NOTE   [name], [name]                      MRN:          [id number]  DATE:09/21/2008                            DOB:          09-18-1923    Zachary Harris comes in today for further management of his coronary disease.  He has been having some chest discomfort in the midsternal region, which  usually is relieved within 10-30 minutes with burping or belching.  It  is clearly not exertion related.   It does not radiate into his shoulder or his jaw.  He has no nausea or  vomiting with this.  No diaphoresis.   He saw Dr. Jonny Ruiz who has scheduled an echocardiogram.  This shows an EF  of 55% with inferior wall hypokinesia with his previous infarct.  He has  got some thickening of his aortic valve, but no significant stenosis.  He has mild mitral regurgitation.  Right sides are normal.   His meds are unchanged.  He does have nitroglycerin tablets, but has not  used them.   PHYSICAL EXAMINATION:  VITAL SIGNS:  His blood pressure today is 130/66,  pulse 64 and regular, his weight is 206.  HEENT:  Unremarkable.  NECK:  Carotid upstrokes were equal bilaterally without bruits.  No JVD.  Thyroid is not enlarged.  Trachea is midline.  LUNGS:  Clear.  No rub.  HEART:  Reveals regular rate and rhythm.  No gallop or murmur.  ABDOMEN:  Soft.  EXTREMITIES:  No cyanosis, clubbing, or edema.  Pulses are present.  NEURO:  Intact.  His mental acuity is somewhat dull today.   His daughter is here.  I have explained that I do not think this is  angina or ischemic symptoms.  I have advised him to take nitroglycerin  if this is persistent or not relieved with belching.  I will see him  back in 6 months.     Thomas C. Daleen Squibb, MD, [facility]  Electronically Signed    TCW/MedQ  DD: 09/21/2008  DT: 09/22/2008  Job #: [id number]

## 2011-05-08 NOTE — Assessment & Plan Note (Signed)
[facility]                            CARDIOLOGY OFFICE NOTE   Zachary Harris, Zachary Harris                      MRN:          [id number]  DATE:11/09/2008                            DOB:          07/18/1923    Mr. Aguayo comes back in for close followup.  Last time I saw him he had  been having some chest discomfort that sounded like reflux.  I advised  to take nitroglycerin if it was really severe or it was not relieved  with belching.   He has had one episode since I saw him, September 21, 2008.  He took a  nitroglycerin and relieved it.  He says I still do not think it is my  heart.   His meds were unchanged.   PHYSICAL EXAMINATION:  VITAL SIGNS:  His blood pressure today is 116/66,  his pulse is 60 and regular, his weight is 206.  He is in no acute  distress.  HEENT:  Unchanged.  LUNGS:  Clear to auscultation and percussion.  HEART:  Regular rate and rhythm.  ABDOMEN:  Soft, good bowel sounds.  No tenderness.  EXTREMITIES:  No cyanosis, clubbing, or edema.  Pulses are intact.   Mr. Digilio discomfort on occasion may be an ischemic equivalent.  I  have advised him to take the nitroglycerin as before which he promises  he will do.  Otherwise, we will see him back again in 3 months.     Thomas C. Daleen Squibb, MD, [facility]  Electronically Signed    TCW/MedQ  DD: 11/09/2008  DT: 11/10/2008  Job #: [id number]

## 2011-05-09 ENCOUNTER — Other Ambulatory Visit: Payer: Self-pay | Admitting: *Deleted

## 2011-05-09 ENCOUNTER — Encounter (INDEPENDENT_AMBULATORY_CARE_PROVIDER_SITE_OTHER): Payer: Medicare Other | Admitting: *Deleted

## 2011-05-09 ENCOUNTER — Ambulatory Visit (INDEPENDENT_AMBULATORY_CARE_PROVIDER_SITE_OTHER): Payer: Medicare Other | Admitting: *Deleted

## 2011-05-09 DIAGNOSIS — I739 Peripheral vascular disease, unspecified: Secondary | ICD-10-CM

## 2011-05-09 DIAGNOSIS — I82409 Acute embolism and thrombosis of unspecified deep veins of unspecified lower extremity: Secondary | ICD-10-CM

## 2011-05-09 DIAGNOSIS — Z7901 Long term (current) use of anticoagulants: Secondary | ICD-10-CM

## 2011-05-09 LAB — POCT INR: INR: 2.4

## 2011-05-11 ENCOUNTER — Encounter: Payer: Self-pay | Admitting: Cardiovascular Disease

## 2011-05-11 NOTE — Op Note (Signed)
[place]  Patient:    Zachary Harris, Zachary Harris                      MRN: [id number] Proc. Date: 04/12/00 Adm. Date:  [id number] Disc. Date: [id number] Attending:  Ivor Messier CC:         Corwin Levins, M.D. [facility]                           Operative Report  PREOPERATIVE DIAGNOSIS:  Senile cataract, left eye.  POSTOPERATIVE DIAGNOSIS:  Senile cataract, left eye.  OPERATION:  Planned extracapsular cataract extraction - phacoemulsification, primary insertion of posterior chamber intraocular lens implant.  SURGEON:  Guadelupe Sabin, M.D.  ASSISTANT:  Nurse.  ANESTHESIA:  Local 4% xylocaine, 0.75 Marcaine, retrobulbar block, topical tetracaine, intraocular Xylocaine.  Anesthesia standby required in this elderly  diabetic patient.  DESCRIPTION OF PROCEDURE:  After the patient was prepped and draped, a speculum was inserted in the left eye.  The eye was turned downward and a superior rectus traction suture placed.  Schiotzs tonometry was recorded at 5-7 scale units with a 5.5 gm weight.  A peritomy was performed adjacent to the limbus from the 11 to 1 oclock position.  The corneoscleral junction was cleaned and a corneoscleral groove made with a 45 degree Superblade.  The anterior chamber was then entered  with the 15 degree blade at the 2:30 position and the 2.5 mm diamond keratome at the 12 oclock position.  Using a bent 26-gauge needle on a Healon syringe, a circular capsular rhexis was begun and then completed with the Grabow forceps.  Hydrodissection and hydrodelineation were performed using 1% Xylocaine.  The 30  degree phacoemulsification tip was then inserted with slow controlled emulsification of the lens nucleus.  Total ultrasonic time:  One minute 7 seconds. Average power level:  14% Total amount of fluid used during the emulsification:  45 cc.  Following removal of the nucleus, the residual cortex was aspirated with  the irrigation-aspiration tip.  The posterior capsule appeared intact with a brilliant red fundus reflex.  It was therefore elected to insert an [employer], SI-40-NB, silicone posterior chamber intraocular lens implant with UV absorber.  Diopter strength +18.00.  This was inserted folded into the anterior chamber with the McDonald forceps, opened, and then centered into the capsular bag using the  [facility] lens rotator.  The lens appeared to be well centered.  The Healon which ad been used during the procedure was then aspirated and replaced with balanced salt solution and a small amount of Miochol ophthalmic solution to stimulate pupillary constriction.  The operative incisions appeared to be self-sealing and no sutures were required.  Eserine ophthalmic ointment was instilled in the conjunctival cul-de-sac and a light patch and protector shield applied.  DURATION OF PROCEDURE AND ANESTHESIA ADMINISTRATION:  Forty-five minutes.  The patient tolerated the procedure well, in general, left the operating room for the recovery room in good condition.DD:  04/12/00 TD:  04/12/00 Job: [id number]

## 2011-05-11 NOTE — Op Note (Signed)
NAME:  Zachary Harris, Zachary Harris NO.:  [id number]   MEDICAL RECORD NO.:  [id number]                   PATIENT TYPE:  INP   LOCATION:  2857                                 FACILITY:  [facility]   PHYSICIAN:  Deidre Ala, M.D.                 DATE OF BIRTH:  October 07, 1923   DATE OF PROCEDURE:  08/20/2002  DATE OF DISCHARGE:                                 OPERATIVE REPORT   PREOPERATIVE DIAGNOSES:  Closed left proximal humeral fracture at surgical  neck with shaft displaced completely medially.   POSTOPERATIVE DIAGNOSES:  Closed left proximal humeral fracture at surgical  neck with shaft displaced completely medially.   OPERATION PERFORMED:  Open treatment using reduction and percutaneous  pinning with two retrograde, two antegrade threaded Steinmann pins.   SURGEON:  Bradley Ferris, M.D.   ASSISTANT:  Anise Salvo. Clark, P.A.-C.   ANESTHESIA:  General endotracheal.   CULTURES:  None.   DRAINS:  None.   ESTIMATED BLOOD LOSS:  Minimal.   PATHOLOGIC FINDINGS AND HISTORY:  The patient is a 77 year old male who fell  a week ago sustaining this fracture.  We got him on the operating room  schedule thinking that because he was so far displaced medially with the  shaft of the humerus that he would be difficult to reduce.  That he would  ultimately come to a hemiarthroplasty.  However, we felt we should try to  reduce it.  There was a tuberosity fracture but it was essentially  nondisplaced making this effectively a two-part.  We brought him to the  operating room, put him in a beach chair position, got the C-arm in and we  used countertraction across the chest and with the sheet around the elbow  pulling downward and were able to disengage the impacted fracture medially  and pull it out underneath the humeral head which was still residing in the  glenoid with an intact rotator cuff and did get an excellent reduction on AP  and lateral view with internal and external  rotation done.  I then placed K-  wires.  With adequate anesthesia obtained, using endotracheal technique, 1  gm Ancef given for IV prophylaxis, the patient was placed in the supine  beach chair position, we manipulated the shoulder with a C-arm and felt we  could get the reduction and so elected to proceed with this treatment.  Standard prepping and draping was carried out.  I then made sure my  reduction was satisfactory by pinning it a bit with the C-arm on internal  external rotation views 90 degrees to one another.  I then placed two  divergent Steinmann pins 1/8 from distal proximal into the head in the  distal shaft fragment into the head and then two proximal for the tuberosity  into the shaft.  These were divergent and fixed from both angles of the  fracture with  satisfactory position and alignment of the humeral shaft  underneath the humeral head.  The tuberosity remained in satisfactory  position.  We then cut the pins, capped them and placed the dressing.  The  patient then was awakened,  having tolerated the procedure well, placed back in the sling and taken to  the recovery room in satisfactory condition for overnight observation.   DESCRIPTION OF PROCEDURE:                                                 Deidre Ala, M.D.    VEP/MEDQ  D:  08/20/2002  T:  08/24/2002  Job:  [id number]

## 2011-05-11 NOTE — Op Note (Signed)
NAME:  Zachary Harris, Zachary Harris NO.:  [id number]   MEDICAL RECORD NO.:  [id number]                   PATIENT TYPE:  INP   LOCATION:  0484                                 FACILITY:  [facility]   PHYSICIAN:  Velora Heckler, M.D.                DATE OF BIRTH:  21-Mar-1923   DATE OF PROCEDURE:  02/18/2003  DATE OF DISCHARGE:                                 OPERATIVE REPORT   PREOPERATIVE DIAGNOSIS:  Acute appendicitis.   POSTOPERATIVE DIAGNOSIS:  Acute appendicitis.   PROCEDURE:  Open appendectomy.   SURGEON:  Velora Heckler, M.D.   ANESTHESIA:  General per Dr. Rica Mast.   ESTIMATED BLOOD LOSS:  Minimal.   PREPARATION:  Betadine.   COMPLICATIONS:  None.   INDICATIONS:  This patient is a 77 year old white male who presents with an  18-hour history of abdominal pain, localized to the right lower quadrant.  The patient was seen and evaluated by Romero Belling and admitted to the  hospital for further evaluation.  White blood cell count was elevated at  14,000.  CT scan of the abdomen and pelvis was read by Dr. Maryelizabeth Rowan  as consistent with acute appendicitis.  The patient was seen now and brought  to the operating room urgently for appendectomy.   PROCEDURE IN DETAIL:  The procedure was done in OR #1 at the [facility].  The patient was brought to the operating room and  placed in the supine position on the operating room table.  Following the  administration of general anesthesia, the patient was prepped and draped in  the usual strict aseptic fashion.  After ascertaining an adequate level of  anesthesia had been obtained, a McBurney's incision was made in the right  lower quadrant with a #10 blade.  Dissection was carried down through the  subcutaneous tissues, and hemostasis was obtained with the electrocautery.  The fascia of the abdominal wall was divided in line with its fibers, and  the muscles were split.  The peritoneum was  incised, and the peritoneal  cavity was entered.  There was no fluid.  There was no sign of abscess.  The  cecum was mobilized.  The base of the appendix was identified.  The appendix  was acutely inflamed, indurated, and swollen.  It extends retrocecally and  laterally.  The base of the appendix was ligated with a 0 chromic gut  ligature.  A hemostat was placed across the appendix, and the appendix was  transected using the electrocautery to cauterize the stump of the appendix.  The mesoappendix was then ligated in continuity with 3-0 silk ties and  divided.  The appendix was dissected off of the lateral and posterior wall  of the cecum.  Again, the mesenteric vessels were ligated in continuity with  3-0 silk ties and divided.  The appendix was completely excised and  submitted as a  specimen.  The right lower quadrant was copiously irrigated  with warm saline, which was evacuated.  Very good hemostasis was noted.  The  peritoneum was closed with a running 2-0 Vicryl suture.  The tissues were  irrigated.  The external oblique fascia was closed with a running 0 Vicryl  suture.  The subcutaneous  tissues were irrigated.  The skin was closed with stainless steel staples.  The wound was washed and dried.  Sterile dressings were applied.  The  patient was awakened from anesthesia and brought to the recovery room in  stable condition.  The patient tolerated the procedure well.                                               Velora Heckler, M.D.    TMG/MEDQ  D:  02/18/2003  T:  02/19/2003  Job:  [id number]   cc:   Gregary Signs A. Everardo All, M.D. [facility]   Corwin Levins, M.D. [facility]

## 2011-05-11 NOTE — H&P (Signed)
NAME:  Zachary Harris, Zachary Harris                         ACCOUNT NO.:  [id number]   MEDICAL RECORD NO.:  [id number]                   PATIENT TYPE:  INP   LOCATION:  0484                                 FACILITY:  [facility]   PHYSICIAN:  Sean A. Everardo All, M.D. [facility]           DATE OF BIRTH:  01-22-1923   DATE OF ADMISSION:  02/18/2003  DATE OF DISCHARGE:                                HISTORY & PHYSICAL   REASON FOR ADMISSION:  Abdominal pain.   HISTORY OF PRESENT ILLNESS:  The patient is a 77 year old man with one day  of moderate pain across the lower abdomen.  It is worse at the right lower  quadrant.  There are no associated symptoms.   PAST MEDICAL HISTORY:  1. Colonic polyps.  2. CABG, 1997.  3. Dyslipidemia.  4. Type 2 diabetes.  5. Hypertension.   MEDICATIONS:  1. Zocor 20 mg daily.  2. Lopressor 25 mg twice daily.  3. Ecotrin, uncertain dosage, one daily.  4. Altace 5 mg daily.  5. Iron supplement daily.  6. Zoloft 50 mg daily.  7. Avandia 8 mg daily.  8. Amaryl 4 mg daily.   SOCIAL HISTORY:  The patient is married.  He is retired.   FAMILY HISTORY:  No one else at home is ill.   REVIEW OF SYSTEMS:  Denies the following:  Fever, syncope, chest pain,  diarrhea, nausea, vomiting, bloating, rectal bleeding, hematuria, and  dysuria, and skin rash.   PHYSICAL EXAMINATION:  VITAL SIGNS:  Blood pressure 144/70, heart rate is  70, temperature is 97.6, weight is 196.  GENERAL:  No distress.  SKIN:  Not diaphoretic.  No rash.  HEENT:  Head is atraumatic.  Sclerae nonicteric.  Pharynx is clear.  NECK:  Supple.  Carotid upstrokes are normal.  CHEST:  Clear to auscultation.  CARDIOVASCULAR:  No JVD, no edema, regular rate and rhythm.  No murmur.  ABDOMEN:  Soft.  There is slight to moderate right lower quadrant tenderness  with no rebound tenderness.  Bowel sounds are present, but decreased from  normal.  There is no other tenderness.  The abdomen is not distended and  there is no  hernia.  RECTAL:  Normal prostate, normal sphincter tone.  Hemoccult negative.  EXTREMITIES:  No deformity of the joints.  NEUROLOGIC:  Alert, well-oriented.  Moves all fours.  Gait is observed in  the office to be normal.   LABORATORY DATA:  Urinalysis:  Dipstick in our office is normal.   IMPRESSION:  1. Right lower abdominal pain, predominantly on the right, uncertain     etiology.  2. Other chronic medical problems as noted above.   PLAN:  1. Admit to [facility].  2. Check laboratory studies and CT scan.  3. Call Dr. Darnell Level of [facility] and ask him to check the     patient after the results of these studies are  available.  4. Hold the Amaryl and aspirin for safety reasons.  5. I discussed with the patient risks of going home, versus a choice of     hospitalization.  I have told him that this is unlikely to be a serious     medical problem causing this, but possible causes of abdominal aortic     aneurysm and appendicitis are both potentially fatal diseases.  Because     of this very risky situation, he agrees to hospital admission.                                                   Sean A. Everardo All, M.D. [facility]    SAE/MEDQ  D:  02/18/2003  T:  02/18/2003  Job:  [id number]

## 2011-05-11 NOTE — Discharge Summary (Signed)
NAME:  Zachary Harris, KRAMP NO.:  [id number]   MEDICAL RECORD NO.:  [id number]          PATIENT TYPE:  INP   LOCATION:  3741                         FACILITY:  [facility]   PHYSICIAN:  Jesse Sans. Wall, MD, FACCDATE OF BIRTH:  04/07/1923   DATE OF ADMISSION:  07/26/2006  DATE OF DISCHARGE:  07/30/2006                                 DISCHARGE SUMMARY   PRINCIPAL DIAGNOSIS:  Acute inferior ST elevation myocardial infarction.   SECONDARY DIAGNOSES:  1. Coronary artery disease status post five-vessel coronary artery bypass      graft in 1997.  2. Type 2 diabetes mellitus.  3. Depression.  4. Prostate carcinoma diagnosed in June 2007.  5. Hypertension.  6. Hyperlipidemia.  7. Ongoing tobacco abuse.   ALLERGIES:  NO KNOWN DRUG ALLERGIES.   PROCEDURE:  Left heart cardiac catheterization with successful PCI and  stenting of the vein graft to the first obtuse marginal with placement of a  3.5 x 16 mm Liberte bare metal stent.   HISTORY OF PRESENT ILLNESS:  An 77 year old male with prior history of CAD  as outlined in the problem list.  He was in his usual state of health until  July 26, 2006 when he began to experience 9/10 left-sided chest pressure  associated with diaphoresis.  After calling into both cardiology and GI on-  call services, he was advised to come into the [facility] ED and upon  arrival EKG showed inferior ST-segment elevation.  At that point, code STEMI  was activated and the patient was taken emergently to the cardiac  catheterization laboratory.   HOSPITAL COURSE:  Cardiac catheterization performed July 26, 2006 revealed  a 99% stenosis in the proximal vein graft to the obtuse marginal and this  was successfully stented with a 3.5 x 16-mm Liberte bare metal stent.  He  also was found to have a 99% stenosis in the mid native RCA with an occluded  vein graft to the RCA.  Also of note the vein graft to the diagonal had an  80% mid stenosis.  He otherwise  had native three-vessel disease with patent  LIMA to the LAD.  He was monitored in the ICU postprocedure and secondary to  mild anemia with hemoglobin 9.6 and hematocrit 28.2 he was transfused 1 unit  of packed red blood cells.  He was transferred to telemetry on July 28, 2006 and increased ambulation with the assistance of the cardiac rehab team.  He had no recurrent symptoms and decision was made to discharge him on  July 30, 2006 with plan for an outpatient 2-D echocardiogram to reevaluate  LV function as well as an outpatient Myoview in approximately 6 weeks to  determine the ischemic significance of the lesion in the vein graft to the  diagonal.  The patient was discharged home on July 30, 2006 in satisfactory  condition.   DISCHARGE LABS:  Hemoglobin 10.7, hematocrit 30.8, WBC 5.8, platelets 195.  Sodium 140, potassium 4.3, chloride 110, CO2 24, BUN 24, creatinine 1.2,  glucose 94, calcium 8.9.  CK 212, MB 19.5, troponin I 6.62.  BNP 566.0.  Total cholesterol 107, triglycerides 121, HDL 60, LDL 67.   DISPOSITION:  The patient was discharged home on July 30, 2006 in good  condition.   FOLLOW-UP APPOINTMENTS:  Follow-up was arranged with Dr. Daleen Squibb on August 07, 2006 at 8:45 a.m.   DISCHARGE MEDICATIONS:  1. Plavix 75 mg daily for at least 1 year.  2. Nitroglycerin 0.4 mg sublingual p.r.n. chest pain.  3. Zocor 40 mg q.h.s.  4. Actos 15 mg daily.  5. Foltx 1 mg daily.  6. Mobic 4 mg daily.  7. Metformin 5 mg daily.  8. Uroxatral 10 mg daily.  9. Ferrous sulfate 325 mg daily.  10.Aspirin 325 mg daily.   PENDING LAB STUDIES:  None.   DURATION OF DISCHARGE ENCOUNTER:  30 minutes including physician time.      Nicolasa Ducking, ANP      Jesse Sans. Daleen Squibb, MD, [facility]  Electronically Signed    CB/MEDQ  D:  11/18/2006  T:  11/18/2006  Job:  [id number]

## 2011-05-11 NOTE — Assessment & Plan Note (Signed)
[facility]                              CARDIOLOGY OFFICE NOTE   [name], [name]                      MRN:          [id number]  DATE:08/07/2006                            DOB:          30-May-1923    Zachary Harris returns today after having an inferior wall infarct on July 26, 2006.  He had a lesion in a vein graft to a marginal branch which  compromised the distal right coronary artery as well.  He had a Liberty  stent placed by Dr. Bonnee Quin without complication.  He has residual 80%  in diagonal vein graft which may need elective intervention as well.   He has a patent LIMA to the LAD, totally occluded vein graft to the right  coronary artery, diffuse disease right coronary artery with extensive  calcification with slow antegrade flow, ejection fraction 45% with mild  mitral regurgitation, inferior basilar hypokinesia to akinesia.   Since discharge he has had no angina.  He has had some lower extremity  swelling.  He denies any paroxysmal nocturnal dyspnea or orthopnea  otherwise.   His meds include Metoprolol 25 mg a day, Actos 15 mg a day, Foltex 1 by  mouth daily, __________  4 mg a day, metformin 500 daily, UroXatral 10 mg a  day, Simvastatin 40 mg a day, Tricor 145 mg a day, Ecotrin 325 mg a day,  iron 325 a day.   Blood pressure is 124/68, pulse is 57 and he is in sinus bradycardia.  He  has residual ST segment changes inferiorly.  Weight is 186.  He is in no  acute distress.  Carotids are full without JVD.  Thyroid is not enlarged.  Trachea is midline.  Lungs are clear.  Heart reveals regular rate and rhythm  with a soft systolic murmur at the apex.  Abdomen is soft, good bowel  sounds.  His right cath site is unremarkable with no bruits.  His lower  extremities show 1 to 2+ pitting edema.  Pulses were reduced.   [name] is stable, status post inferior wall infarct.  Arrange to have  adenosine myoview in about 3 to 4 weeks.  If  this shows significant ischemia  diagonal distribution, will ask Dr. Riley Kill to consider elective PCI.  Otherwise, will go with medical therapy.  Will ask him to keep his legs  elevated, to decrease his salt intake, call me if his fluid gets worse.   He will contact cardiac rehab and probably enroll in this program.  I  strongly encourage this.                               Thomas C. Daleen Squibb, MD, [facility]    TCW/MedQ  DD:  08/07/2006  DT:  08/07/2006  Job #:  [id number]   cc:   Corwin Levins, MD

## 2011-05-11 NOTE — Op Note (Signed)
   NAME:  Zachary Harris, Zachary Harris NO.:  [id number]   MEDICAL RECORD NO.:  [id number]                   PATIENT TYPE:  INP   LOCATION:  0484                                 FACILITY:  [facility]   PHYSICIAN:  Velora Heckler, M.D.                DATE OF BIRTH:  07/11/1923   DATE OF PROCEDURE:  02/18/2003  DATE OF DISCHARGE:                                 OPERATIVE REPORT   PREOPERATIVE DIAGNOSIS:  Acute appendicitis.   POSTOPERATIVE DIAGNOSIS:  Acute appendicitis.   PROCEDURE:  Open appendectomy .   SURGEON:  Velora Heckler, M.D.   ASSISTANT:  None.                                                 Velora Heckler, M.D.    TMG/MEDQ  D:  02/18/2003  T:  02/19/2003  Job:  [id number]

## 2011-05-11 NOTE — Assessment & Plan Note (Signed)
[facility]                                   ON-CALL NOTE   MAHKAI, FANGMAN                        MRN:          [id number]  DATE:07/26/2006                            DOB:          1923-04-26    CALLER:  Mr. Estis and his wife.   TIME OF CALL:  7:10 p.m.   I got a call this evening from Mr. and Mrs. Zeien.  Mr. Lange was  complaining of chest pain.  He was convinced that it might be gas.  He did  undergo an endoscopy and a colonoscopy yesterday with Dr Juanda Chance, apparently  no therapy provided and no significant abnormalities found except for a  vein for which he has an ultrasound scheduled.  He denies abdominal pain,  fever, bleeding or change in bowel habits.  His wife states that he has been  having more of these gas attacks and chest pain in recent weeks in terms  of frequency and severity.  He does have a cardiac history and has had prior  cardiac bypass.  He states that he has taken acid-suppressive medication  without relief.  I told the patient and his wife that I was concerned that  this might represent something other than gas and that he should have his  problems with chest pain evaluated immediately.  I instructed him either to  call his primary care physician or cardiologist or proceed to the hospital  if they wish.  They understood and were agreeable.                                   Wilhemina Bonito. Eda Keys., MD   JNP/MedQ  DD:  07/26/2006  DT:  07/27/2006  Job #:  [id number]   cc:   Hedwig Morton. Juanda Chance, MD  Michela Pitcher, PharmD  Corwin Levins, MD

## 2011-05-11 NOTE — Assessment & Plan Note (Signed)
[facility]                            CARDIOLOGY OFFICE NOTE   HAYES, CZAJA                      MRN:          [id number]  DATE:01/16/2007                            DOB:          [birth year]    Mr. Zachary Harris returns today for management of the following issues:  1. Coronary artery disease, status post coronary artery bypass      grafting.  This was in 1997.  Status post inferior wall infarct      July 26, 2006 with residual disease and a diagonal vein graft.      Adenosine Myoview August 15, 2006 showed an EF of 56% with akinesia      of the inferior wall.  There was minimal periinfarct ischemia at      the apex, but no suggestion of lateral wall ischemia.  He is having      no angina.  We have continued medical therapy.  2. Tobacco use.  He has now stopped on Chantix!  He even stopped      drinking coffee.  3. Type 2 diabetes followed by Dr. Jonny Ruiz.  4. Hypertension.  5. Hyperlipidemia.   Lipids were at goal recently with Dr. Jonny Ruiz.   He is doing remarkably well.  He is having no angina.  No orthopnea or  PND.  Occasionally he has a little bit of edema in his legs.   For whatever reason, he is not on metoprolol 25 mg a day, which he is  supposed to be on.  We have tried to figure this out, but cannot.   He lets me know that he is going to try to get some of his medicines  through the [place].  I told him to be very careful with any substitutions  they make, and run it by [place].  We tried to give him as many generics as  possible.   MEDICINES:  1. Metoprolol 25 mg a day.  2. Foltx.  3. UroXatral 10 mg a day.  4. Simvastatin 40 mg a day.  5. Tricor 145 mg a day.  6. Ecotrin 325 mg a day.  7. Iron 325 mg a day.  8. Actos plus metformin 15/500 daily.  9. Lisinopril 20 mg daily.  10.Plavix 75 mg a day.  11.Vesicare 5 mg a day.  12.Nitroglycerin p.r.n.   His blood pressure is 130/70.  His pulse 66 and regular.  His weight is  196.  He looks  remarkably good.  HEENT:  Normocephalic and atraumatic.  PERRLA.  Extraocular movements  intact.  Sclerae are clear.  Carotid upstrokes were equal bilaterally without bruits.  There is no  JVD.  Thyroid is not enlarged.  Trachea is midline.  LUNGS:  Clear to auscultation.  HEART:  Reveals a nondisplaced PMI.  He has a normal S1 and S2 with a  soft systolic murmur at the apex.  ABDOMINAL EXAM:  Soft with good bowel sounds.  EXTREMITIES:  Reveal no edema.  Pulses are present.  NEUROLOGIC EXAM:  Intact.   Electrocardiogram shows normal sinus rhythm with small Qs inferiorly,  otherwise  negative.   I had a long talk with Mr. Blue.  We renewed his metoprolol extended  release 25 mg once a day.  I have left his other medicines unchanged.  I  have asked him to talk to me before he takes anything that the [place]  substitutes for his medications.  I will see him back in 6 months.     Thomas C. Daleen Squibb, MD, [facility]  Electronically Signed    TCW/MedQ  DD: 01/16/2007  DT: 01/16/2007  Job #: [id number]

## 2011-05-11 NOTE — H&P (Signed)
[place]. [facility]  Patient:    Zachary Harris, Zachary Harris Visit Number: [id number] MRN: [id number]          Service Type: MED Location: [phone] Attending Physician:  Corwin Levins Dictated by:   Corwin Levins, M.D. [facility] Admit Date:  10/31/2001 Discharge Date: 11/01/2001                           History and Physical  CHIEF COMPLAINT:  Low blood sugar reaction.  HISTORY OF PRESENT ILLNESS:  Mr. Disney is a 77 year old black male who presented weak and dizzy, with finding of low blood sugar, approximately 28 in the ER, treated with an amp of D-50, blood sugar up to 157, mental status back to baseline.  He is compliant and reliable with multiple oral hypoglycemics, which he kept taking in the face of near zero p.o. intake after he has been ill in the last three days.  He has been ill secondary to low grade temperature, some cough, congestion, no chills, shortness of breath, or chest pain.  He is not checking his CBGs at home.  Apparently this sort of situation has never come up before.  He does have a glucometer and supplies and is trained to use it.  He was given decongestant with an office visit with his gastroenterologist yesterday, per Dr. Lina Sar.  PAST MEDICAL HISTORY:  ALLERGIES:  No known drug allergies.  CURRENT MEDICATIONS:  1. Avandia 4 mg q.d.  2. Tricor 160 q.d.  3. Guaifenesin/pseudoephedrine 600/120 q.d.  4. Metformin 1000 mg b.i.d.  5. Ecotrin 325 mg q.d.  6. Altace 5 mg p.o. q.d.  7. Metoprolol 50 mg half p.o. b.i.d.  8. Amaryl 4 mg two p.o. q.d.  9. Zocor 20 mg q.h.s. 10. Prevacid 30 mg p.o. q.d.  ILLNESSES:  1. History of coronary disease, status post CABG.  2. Diabetes.  3. Hypertension.  4. Hypercholesterolemia.  5. GERD.  6. History of colon polyps.  SURGERY:  1. Status post left cataract.  2. Left knee.  3. Cholecystectomy.  4. Left rotator cuff.  SOCIAL HISTORY:  Married, lives with wife.  Tobacco:  Less than  one pack per day.  Alcohol:  Rare.  FAMILY HISTORY:  Otherwise noncontributory.  REVIEW OF SYSTEMS:  Chronic mild depressive symptoms.  PHYSICAL EXAMINATION:  VITAL SIGNS:  Blood pressure 127/57, respirations 22, heart rate 88.  HEENT:  Sclerae clear, TMs clear.  Pharynx benign.  NECK:  Without lymphadenopathy, JVD, thyromegaly.  CHEST:  Few left lower lobe rales.  CARDIAC:  Regular rate and rhythm, no murmur.  ABDOMEN:  Soft, nontender, positive bowel sounds, no organomegaly.  EXTREMITIES:  No edema.  LABORATORY:  An i-STAT in the ER:  Hemoglobin 16.  Glucose 28, otherwise normal.  Later CBG 157 after an amp of D-50.  ASSESSMENT/PLAN: 1. Hypoglycemia on multiple medications with long half-life including the    Amaryl and Avandia in the face of decreased p.o. intake.  He is to be    admitted overnight for 23-hour observation with D-5 in the IV fluids,    checking CBGs every four hours, holding oral hypoglycemics, and p.r.n.    amp of D-50. 2. Questionable pneumonia.  Chest x-ray, CBC, and O2.  Give Rocephin IV 1 g. 3. Hypertension.  Stable.  Continue medication as it is. Dictated by:   Corwin Levins, M.D. [facility] Attending Physician:  Corwin Levins DD:  10/31/01  TD:  11/01/01 Job: [id number]

## 2011-05-11 NOTE — Cardiovascular Report (Signed)
NAME:  Zachary Harris, Zachary Harris NO.:  [id number]   MEDICAL RECORD NO.:  [id number]           PATIENT TYPE:   LOCATION:                                 FACILITY:   PHYSICIAN:  Arturo Morton. Riley Kill, MD, FACCDATE OF BIRTH:  [birth year]   DATE OF PROCEDURE:  DATE OF DISCHARGE:                              CARDIAC CATHETERIZATION   INDICATIONS:  Mr. Stambaugh is an 77 year old with a history of remote  revascularization surgery.  He presented with an inferior ST elevation and  was referred for urgent cardiac catheterization.   PROCEDURE:  The patient was brought to the catheterization laboratory and  prepped and draped in the usual fashion.  Through an anterior puncture, the  femoral artery was entered.  Following this, views were obtained of the left  and right coronary arteries.  Vein graft angiography was then performed.  We  analyzed the findings at that point in time.  There appeared to be a right  vein graft occlusion, but this appeared to be chronic.  There was a highly  thrombotic ruptured plaque in the proximal portion of the vein graft that  supplies the marginal system, and this appears to collateralize the  posterolateral branch, and the retrograde portion of the RCA graft.  There  was evidence of active thrombus at this site.  Prior to injecting the  internal mammary and the LAD, we elected to go ahead and treat the vein  graft.  A  JR4 guiding catheter with side holes was utilized.  The patient  received aspirin and Plavix, and unfractionated heparin was utilized.  Glycoprotein inhibition was not utilized based on previous randomized data.  We initially tried to pass a 5-French spider device, but were unable to get  across the lesion.  Predilatation was done with a 1.5 x 15 Maverick balloon  and then a 2 Maverick balloon.  Again, we had difficulty getting across, but  we were able to after a 2.25 predilatation able to get the spider catheter  across.  The vessel was  distally protected prior to placing a 3.5 x 16  Liberte stent.  The stent was deployed at 14 atmospheres with a marked  improvement in appearance of the vessel.  The vein graft flow resided at the  distal end of where the stent was located, so we then post dilated using a  4.5 x 8 Quantum Maverick at the distal end of the stent.  There is dramatic  improvement in the appearance of the artery, and significant resolution of  ST-segment elevation.  Following this, views of the internal mammary were  obtained as well as ventriculography.  He tolerated the overall procedure  well and was markedly improved at the completion of procedure.   HEMODYNAMIC DATA:  1. Central aortic pressure 122/60, mean 84.  2. Left ventricular pressure 135/89.  3. There does not appear to be a significant gradient on pullback across      aortic valve.   ANGIOGRAPHIC DATA:  1. The left main coronary artery disease is severely calcified and      subtotally  occluded.  This likely represents the proximal LAD is the      circumflex proper is occluded.  The LAD proper is severely calcified.      It leads to a septal perforator and a small diagonal branch.  There are      also bridging collaterals to an A-V collateral that supplies part of      the inferior myocardial segment.  Just proximal to the septal      perforator and diagonal is another 90% stenosis.  2. The right coronary artery is segmentally plaqued in the proximal vessel      and then subtotally occluded in the mid vessel.  There is slow filling      of the vessel distally but this appears to be extraordinarily calcified      and there is slow antegrade flow into the posterior descending branch  3. The saphenous vein graft to the distal right coronary artery is      occluded.  There is no contrast hang up suggesting the possibility that      this represents a chronic occlusion.  4. The saphenous vein graft to the obtuse marginal branch importantly has       a subtotally occluded lesion with what appears to be an active      thrombus.  There is to-and-fro filling of a thrombus back and forth in      this vein graft lesion and this supplies the obtuse marginal branch.      Distally, this obtuse marginal branch supplies collaterals to what      appears to be a posterolateral branch and then retrograde filling of a      vein graft bridge over probably to the PDA which is filling in a      retrograde fashion.  Following stenting with distal protection, this      99% stenosis is reduced to less than 20% with an excellent angiographic      lumen and no residual haziness.  There is also no evidence of no      reflow.  The vein graft to the diagonal branch remains patent.      However, there is ostial stenosis of probably 70% and then a mid      stenosis of 75% to 80%.  It does not appear to be hazy it just appears      to be a significant focal lesion.  5. The internal mammary is patent.  There is perhaps mild ostial narrowing      of the internal mammary which does not appear to be flow-limiting.      This inserts into a severely diseased left anterior descending artery.      This left anterior descending artery appears to also collateralize      perhaps part of the right coronary distribution.  6. Ventriculography in the RAO projection reveals overall ejection      fraction probably in the range of about 45%.  There appears to be some      mitral regurgitation as well of the inferobasal segment is severely      hypo to akinetic.   CONCLUSIONS:  1. Acute inferior infarction possibly related to collateral compromise      from an acute plaque rupture in the vein graft to the obtuse marginal      branch which collateralizes the right coronary artery.  2. Probable chronic total occlusion of the vein graft to the right  coronary with no contrast hang up  3. Severely diseased native right coronary artery with extensive      calcification and slow  antegrade flow. 4. Severe disease of the left coronary system.  5. Continued patency of the internal mammary to the left anterior      descending.  6. Significant vein graft stenoses of the diagonal branch.   DISPOSITION:  At the present time, the patient appears to be substantially  improved since acute treatment of the vein graft.  Close follow up with Dr.  Daleen Squibb will be recommended.  We will need to discuss whether or not to  intervene on the other vein graft, although the patient is elderly and  clinically stable at the present time.  He will see Dr. Daleen Squibb back in  followup after discharge from the hospital.      Arturo Morton. Riley Kill, MD, [facility]  Electronically Signed     TDS/MEDQ  D:  08/03/2006  T:  08/04/2006  Job:  [id number]   cc:   Farris Has. Dorethea Clan, MD  Jesse Sans. Wall, MD, [facility]

## 2011-05-11 NOTE — Assessment & Plan Note (Signed)
[facility]                            CARDIOLOGY OFFICE NOTE   Zachary Harris, Zachary Harris                      MRN:          [id number]  DATE:03/19/2007                            DOB:          1923-04-10    Mr. Zachary Harris returns today after having a visit in [facility] with chest pain.  He thinks he drank some buttermilk and then  started having chest pressure.  He went to the ER.  On the way into the  emergency room, he burped a couple of times and started getting relief.   His cardiac enzymes x1were negative.  His other blood work was  unremarkable except for some anemia which is baseline at 10.7.   He has had no discomfort since.   MEDICATIONS:  Unchanged since last visit.   PHYSICAL EXAMINATION:  VITAL SIGNS:  Blood pressure today is 115/51.  His pulse is 60 and regular.  HEENT:  Normocephalic and atraumatic.  PERRLA.  Extraocular movements  intact.  Sclerae clear.  Facial symmetric is normal.  NECK:  Carotid upstrokes are equal bilaterally with a soft systolic  sound, left greater than right.  There is no JVD.  LUNGS: Clear.  There is no rub.  HEART:  Regular rate and rhythm without murmur, rub, or gallop.  ABDOMEN:  Soft.  EXTREMITIES:  No cyanosis, clubbing, or edema.  Pulses were present.   Electrocardiogram today shows sinus rhythm with inferior Q's with no  change from before.   ASSESSMENT:  1. Gastroesophageal reflux disease.  2. Coronary artery disease, currently stable.  3. Low-grade anemia.   I have recommended over-the-counter Gas-X, Maalox, or Mylanta p.r.n.  Hopefully this will save him any misery in the future. We will plan on  seeing him back as scheduled.     Thomas C. Daleen Squibb, MD, [facility]  Electronically Signed    TCW/MedQ  DD: 03/19/2007  DT: 03/19/2007  Job #: [id number]

## 2011-05-11 NOTE — Discharge Summary (Signed)
[place]. [facility]  Patient:    Zachary Harris, Zachary Harris Visit Number: [id number] MRN: [id number]          Service Type: MED Location: [phone] Attending Physician:  Corwin Levins Dictated by:   Corwin Levins, M.D. [facility] Admit Date:  10/31/2001 Discharge Date: 11/01/2001                             Discharge Summary  DISCHARGE DIAGNOSES: 1. Hypoglycemia secondary to continued oral hyperglycemic administration in    the face of decreased p.o. intake. 2. Pneumonia. 3. Hypertension. 4. Diabetes mellitus. 5. History of coronary artery disease, status post coronary artery bypass    graft. 6. Hypercholesterolemia. 7. Gastroesophageal reflux disease. 8. History of colon polyps.  PROCEDURES:  None.  CONSULTS:  None.  HISTORY AND PHYSICAL:  See that dictated the day of admission by myself.  HOSPITAL COURSE:  Zachary Harris is a 77 year old white male with problems as above who presented with low blood sugar reaction, CBG 28 on admission to ER.  He was observed overnight with D5 in the fluid.  Frequent CBG checks with CBGs being 108, 91, then 120 this morning.  His oral hyperglycemics were held. Also of note is that he had left lower lobe rales but no fever, white blood cell count normal. It was felt he probably had low grade pneumonia as low grade pneumonitis as etiology for his decreased p.o. intake and cough.  He was given Rocephin IV which he tolerated well.  The day of discharge, he is eating a little breakfast.  CBGs are stable.  He is overall feeling improved.  No further low blood sugar type symptoms.  He is felt improved and ready for discharge.  DISPOSITION:  Discharged to home in good condition.  DISCHARGE MEDICATIONS:  1. Tricor 160 mg p.o. q.d.  2. Ecotrin 325 mg p.o. q.d.  3. Altace 5 mg p.o. q.d.  4. Metoprolol 50 mg p.o. b.i.d.  5. Zocor 20 mg q.h.s.  6. Prevacid 30 mg p.o. q.d.  7. He is to hold the Avandia completely.  8. Hold the Amaryl  completely.  9. He is to restart the Metformin 1000 mg p.o. b.i.d. when blood sugars     become greater than 150. 10. He is also given Ceftin 250 mg p.o. b.i.d. for pneumonia.  DISCHARGE INSTRUCTIONS:  He will check his CBGs on a daily basis.  FOLLOW-UP:  He will follow up with Corwin Levins, M.D., in one to two weeks.  DIET:  He will follow a diabetic diet as before.  ACTIVITY:  No restrictions. Dictated by:   Corwin Levins, M.D. [facility] Attending Physician:  Corwin Levins DD:  11/01/01 TD:  11/03/01 Job: [id number]

## 2011-05-11 NOTE — Procedures (Signed)
[facility]                            ABDOMINAL ULTRASOUND REPORT   HAZEN, BRUMETT                      MRN:          [id number]  DATE:08/08/2006                            DOB:          1923/09/22    ACCESSION NUMBER:  [id number]   PROCEDURE:  Abdominal ultrasound.   INDICATIONS:  Question of esophageal varices on upper endoscopy.   RESULTS:  Aorta is 17 mm in diameter.  Inferior vena cava patent.   The pancreas appears normal throughout the head, body and tail without  evidence of ductal dilatation, pancreatic masses or peripancreatic  inflammation.   Gallbladder status post remote cholecystectomy.  Common bile duct 10 mm.   The liver with slight increased echodensity with mild enlargement, 16 cm in  overall length.  No focal lesion.  No dilated ducts.   Kidneys measure 10 cm right, 12.7 cm left.  Small 17 mm cyst in the lower  pole of the left kidney.   The spleen mildly enlarged at 13.8 cm, normal being less than 12 cm.   IMPRESSION:  1. Mild hepatosplenomegaly with mildly increased echodensity of the liver      without focal lesion.  2. Status post remote cholecystectomy.                                   Hedwig Morton. Juanda Chance, MD   DMB/MedQ  DD:  08/08/2006  DT:  08/08/2006  Job #:  [id number]

## 2011-05-11 NOTE — Discharge Summary (Signed)
NAME:  Zachary Harris, Zachary Harris                         ACCOUNT NO.:  [id number]   MEDICAL RECORD NO.:  [id number]                   PATIENT TYPE:  INP   LOCATION:  0380                                 FACILITY:  [facility]   PHYSICIAN:  Valetta Mole. Swords, M.D. [facility]           DATE OF BIRTH:  1923/10/01   DATE OF ADMISSION:  04/12/2003  DATE OF DISCHARGE:  04/16/2003                                 DISCHARGE SUMMARY   DISCHARGE DIAGNOSES:  1. Gastroenteritis with diarrhea.  2. Type 2 diabetes.  3. Coronary artery disease.  4. Dyslipidemia.  5. Hypertension.  6. History of colonic polyp.  7. History of appendectomy.  8. History of cholecystectomy.   DISCHARGE MEDICATIONS:  1. Avandia at his usual home dose.  2. Zoloft 50 mg p.o. daily.  3. Altace 5 mg p.o. daily.  4. Ecotrin 1 p.o. daily.  5. Zocor 20 mg p.o. daily.  6. Lopressor 25 mg b.i.d.  7. He will hold his Amaryl unless his morning blood sugars are above 130.     He will then start taking half of his usual Amaryl dose.  He will start     taking Amaryl at 2 mg p.o. daily for a blood sugar over 130 (his usual     home dose is 4 mg p.o. daily).  8. Cipro 500 mg p.o. b.i.d. for seven days.   __________   CONDITION ON DISCHARGE:  Improved.   FOLLOW-UP PLANS:  Dr. Jonny Ruiz, one week.   DISCHARGE LABORATORIES:  Stool cultures have been reincubated for better  growth.  C. difficile negative.  Occult blood was negative.  Fecal white  blood cells, none seen.  BMET on April 13, 2003, was normal except for a  glucose of 114 and BUN of 33.  CMET on admission demonstrated sodium of 134,  glucose 198, BUN 34, and creatinine of 1.8.   HOSPITAL COURSE:  The patient was admitted to the hospital service on April 12, 2003.  See Dr. George Hugh admission note for details.  The patient was  admitted with hypotension thought secondary to gastroenteritis.  There was a  question of bloody diarrhea.  The patient was empirically treated with Cipro  and  Flagyl until C. difficile toxins were negative.  These were subsequently  discontinued.  Note the Flagyl was stopped, but the Cipro was continued, and  it was recommended that be continued for a total of a 7-10 days.  The  suspicion was that this was a bacterial gastroenteritis and will be treated  as such.  The patient's hypotension resolved.   Type 2 diabetes:  The patient tolerated a diabetic diet in the hospital.  His blood sugars remained well controlled off of sulfonylureas.  I told the  patient to monitor his blood sugars at home (will provide a Glucometer).  If  his blood sugars get above 130 in the morning, he will resume half his usual  dose of Amaryl.   Dehydration secondary to gastroenteritis and diarrhea:  This has resolved.  His other medical issues are stable, and he will be followed up with Dr.  Jonny Ruiz.                                               Bruce Rexene Edison Swords, M.D. [facility]    BHS/MEDQ  D:  04/15/2003  T:  04/15/2003  Job:  [id number]   cc:   Corwin Levins, M.D. [facility]

## 2011-05-11 NOTE — H&P (Signed)
Zachary Harris, Zachary Harris               ACCOUNT NO.:  [id number]   MEDICAL RECORD NO.:  [id number]          PATIENT TYPE:  INP   LOCATION:  2807                         FACILITY:  [facility]   PHYSICIAN:  Bimal R. Sherryll Burger, MD      DATE OF BIRTH:  [birth year]   DATE OF ADMISSION:  07/26/2006  DATE OF DISCHARGE:                                HISTORY & PHYSICAL   CHIEF COMPLAINT:  Chest pain, inferior ST elevation MI.   HISTORY OF PRESENT ILLNESS:  This is an 77 year old gentleman with a history  of coronary artery disease status post five vessel CABG in 1997,  hypertension, hyperlipidemia, ongoing tobacco abuse, diabetes mellitus, who  had the resection of a single colonic polyp yesterday without cautery.  The  patient states that approximately 6 p.m. this evening, he started to  experience 9 out of 10 chest pressure on the left side that was not  associated with shortness of breath, nausea, or vomiting.  He did become  diaphoretic.  He states the pain felt like gas.  Given that it did not go  away, the patient became concerned and came to the emergency department.  In  the emergency department, he had an EKG that showed inferior ST elevation.   At baseline, the patient states that over the past few days, he has been  having increasing symptoms of chest pain at rest.  He states it felt like it  was gas.  He said two days ago, he had similar symptoms, however, it was  promptly resolved after burping.  About one month ago, he saw his physician  with complaints of black stools and was found to have a normocytic anemia.  As a result, yesterday, the patient had upper and lower endoscopy that  revealed a single colonic polyp that was removed.  The patient states he has  not had any further problems with black tarry stools or bright red blood per  rectum.   At baseline, the patient states he sleeps on one pillow on a flat bed at  night.  He denies any orthopnea or PND.  He states that he has mild  lower  extremity edema.  He continues to smoke about 1/2 to 3/4 pack cigarettes a  day.   REVIEW OF SYSTEMS:  As above in the HPI, the remaining 18 point review of  systems is negative.   ALLERGIES:  No known drug allergies.   PAST MEDICAL HISTORY:  1.  Coronary artery disease status post five vessel CABG in 1997, last      stress test in 2004 showed a fixed inferior defect with some very mild      peri-infarction ischemia and normal left ventricular ejection fraction.  2.  Diabetes mellitus.  3.  Depression.  4.  Prostate cancer that was diagnose at the end of June, currently      undergoing evaluation for that.  5.  Hypertension.  6.  Hyperlipidemia.  7.  Ongoing tobacco abuse.   CURRENT MEDICATIONS:  1.  Metoprolol 25 mg daily.  2.  Avandia 8 mg daily.  3.  Actos 15 mg daily.  4.  Foltx 1 mg daily.  5.  Mobic 4 mg daily.  6.  Metformin 500 mg daily.  7.  UroXatral 10 mg daily.  8.  Iron sulfate 325 mg daily.  9.  Tricor 145 mg daily.  10. Aspirin 325 mg daily.   SOCIAL HISTORY:  The patient continues to smoke 3/4 pack cigarettes a day.  He has smoked for greater than 15 years.  He denies any alcohol or drugs.   FAMILY HISTORY:  Noncontributory.   PHYSICAL EXAMINATION:  VITAL SIGNS:  On arrival, he was afebrile, he had a pulse of 69, blood  pressure 104/86, respiratory rate 18, O2 saturation 96% on room air.  GENERAL:  He was alert and oriented x3, slightly hard of hearing, in no  acute distress other than complaining of 9 out of 10 chest pain.  HEENT:  Normocephalic, atraumatic, oropharynx clear, extraocular movements  intact, pupils equal, round, reactive to light and accommodation.  NECK:  Supple with 2+ carotid upstrokes symmetric bilaterally, he did not  have any carotid bruit.  LUNGS:  Some mild basilar crackles on the left, none on the right with good  airway bilaterally.  CARDIOVASCULAR EXAM:  Regular rate and rhythm, normal S1 and S2, no murmurs,  gallops,  and rubs, PMI was normal.  He had a surgical scar from a previous  median sternotomy.  ABDOMEN:  Positive bowel sounds, soft, nontender, nondistended, without  hepatosplenomegaly.  RECTAL:  Negative guaiac with stool in the rectal vault, stool color was  light brown.  EXTREMITIES:  5/5 muscle strength of all major muscle groups bilaterally.  He had 1+ lower extremity pretibial pitting edema.  He had 2+ posterior  tibialis and radial pulse was symmetric bilaterally.  He had 2+ femoral  pulses without any femoral bruits bilaterally.   LABORATORY DATA:  Lab data pending.  EKG in the emergency room  showed heart  rate of 67 with ST elevation in 2, 3, AVF, with reciprocal changes in AVL.   ASSESSMENT:  1.  ST elevation MI.  2.  Ongoing tobacco abuse.  3.  Diabetes mellitus.  4.  Hypertension.  5.  Hyperlipidemia.  6.  History of normocytic anemia with colon polyp.  7.  Coronary artery disease status post CABG.   PLAN:  The patient has already been taken to the catheterization lab to  undergo primary PCI for his inferior ST elevation MI .  He will have full  cath orders after whatever necessary interventions are done.  At this time,  I am placing him on Metoprolol 25 mg p.o. b.i.d., aspirin 325 mg daily,  Simvastatin 80 mg daily, iron sulfate 325 mg daily, and Tricor 45 mg a day.  I have held his Actos given his mild lower extremity edema.  Additionally, I  have not started him on Avandia and this medication should be discontinued  given some concern for increased cardiac death.  Additionally, I felt his  Metformin could be held given catheterization at this point in time as well  as his UroXatral given that it could cause further hypotensive events.  He  will have a full set of labs performed after his catheterization.  He will  have a lipid panel drawn as well in the morning.  His goal LDL should be  less than 70. Otherwise, further medical management of his ST elevation MI and  coronary  disease will be managed after his intervention as deemed necessary.  Additionally,  the patient should be cancelled on smoking cessation given his  recent ST elevation MI.           ______________________________  Eston Esters. Sherryll Burger, MD     BRS/MEDQ  D:  07/26/2006  T:  07/26/2006  Job:  [id number]

## 2011-05-11 NOTE — Letter (Signed)
April 23, 2007    Mr. Zachary Harris  [address]  [address]  [place], [place]  [address]   RE:  [name], [name]  MRN:  [id number]  /  DOB:  06-12-1923   To Whom It May Concern:   Mr. Zachary Harris is a patient of mine at [facility].   His medications are as follows:  1. Metoprolol extended release 25 mg daily.  2. Simvastatin 40 mg p.o. daily.  3. Tricor 145 mg p.o. daily.  4. Ecotrin 325 mg daily.  5. Iron 325 mg daily.  6. Actos plus metformin 15/500 1 daily.  7. Lisinopril 20 mg daily.  8. Plavix 75 mg daily.  9. Vesicare 5 mg daily.  10.UroXatral 10 mg daily.  11.Foltz 1 p.o. daily.  12.He also carries sublingual nitroglycerin.   Thank you for your attention to this matter.    Sincerely,      Jesse Sans. Daleen Squibb, MD, [facility]  Electronically Signed    TCW/MedQ  DD: 04/23/2007  DT: 04/23/2007  Job #: [id number]

## 2011-05-11 NOTE — H&P (Signed)
[place]  Patient:    Zachary Harris, Zachary Harris                      MRN: [id number] Adm. Date:  [id number] Disc. Date: [id number] Attending:  Ivor Messier CC:         Corwin Levins, M.D. [facility]                         History and Physical  CHIEF COMPLAINT:  This was a planned outpatient readmission of this 77 year old  white male admitted for cataract implant surgery of the left eye.  HISTORY OF PRESENT ILLNESS:  This patient has been noted to have previous cataract formation in both eyes.  He was previously admitted on March 18, 1998 and had a  planned extracapsular cataract extraction with primary insertion of posterior chamber intraocular lens implant of the right eye.  Vision improved postoperatively to 20/25, +1.  Meanwhile, the unoperated left eye has continued to deteriorate o 20/50.  The patient noting blurred vision has elected to proceed with surgery of the left eye at this time.  He has signed an informed consent and arrangements ade for his outpatient admission at this time.  PAST MEDICAL HISTORY:  The patient is in stable general health under the care of Dr. Jonny Ruiz.  Dr. Jonny Ruiz notes the patients current medications of metoprolol, Ecotrin, Glucophage, Tricor, Avandia, Zocor, and Amaryl.  The patient is diabetic, non-insulin-dependent type.  He has chronic deafness and wears hearing aids.  He is felt to be a low acceptable risk for proposed cataract surgery by Dr. Jonny Ruiz.  REVIEW OF SYSTEMS:  No current cardiorespiratory complaints.  PHYSICAL EXAMINATION:  VITAL SIGNS:  As recorded on admission, blood pressure 115/67, temperature 97.2, heart rate 67, respirations 18.  GENERAL:  The patient is a deaf, well-nourished, well-developed, white male in o acute distress.  HEENT:  Eyes:  Visual acuity as noted above.  External ocular exam:  The eyes are wide and clear with a nuclear cataract left eye, posterior chamber  intraocular ens implant right eye.  No diabetic retinopathy is seen on fundus examination.  The  optic nerve, blood vessels, and macula appear normal.  CHEST/LUNGS:  Clear to percussion and auscultation.  HEART:  Normal sinus rhythm.  No cardiomegaly.  No murmurs.  ABDOMEN:  Negative.  EXTREMITIES:  Negative.  ADMISSION DIAGNOSES:  1. Senile cataract, left eye.  2. Pseudophakia, right eye.  SECONDARY DIAGNOSIS:  Diabetes mellitus, non-insulin-dependent type.  SURGICAL PLAN:  Planned extracapsular cataract extraction with primary insertion of posterior chamber intraocular lens implant, left eye. DD:  04/12/00 TD:  04/12/00 Job: [id number]

## 2011-05-11 NOTE — H&P (Signed)
NAME:  ASIR, BINGLEY NO.:  [id number]   MEDICAL RECORD NO.:  [id number]                   PATIENT TYPE:  INP   LOCATION:  0380                                 [facility]ACILITY:  [facility]   PHYSICIAN:  Sean A. Everardo All, M.D. [facility]           DATE OF BIRTH:  Jan 18, 1923   DATE OF ADMISSION:  04/12/2003  DATE OF DISCHARGE:                                HISTORY & PHYSICAL   REASON FOR ADMISSION:  Hypotension.   HISTORY OF THE PRESENT ILLNESS:  The patient is a 77 year old man with 3  days of urinary frequency, there is associated diarrhea which is of slightly  bloody quality, he also has a slight fever.   PAST MEDICAL HISTORY:  1. Dyslipidemia.  2. Type 2 diabetes.  3. Coronary artery disease.  4. Hypertension.  5. Colonic polyp, hyperplastic, removed in 2002.  6. Appendectomy February 2004.  7. Cholecystectomy in 2000.   MEDICATIONS:  1. Amaryl 4 mg a day.  2. Avandia at an uncertain dosage.  3. Zoloft 50 mg a day.  4. Altace 5 mg a day.  5. Ecotrin one daily.  6. Zocor 20 mg a day.  7. Lopressor 25 mg twice daily.   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  He is married.   FAMILY HISTORY:  No one else at home is ill.   REVIEW OF SYSTEMS:  Slight headache but he denies the following - weight  change, syncope, chest pain, shortness of breath, cough, nausea, vomiting,  abdominal pain, dysuria, decreased urinary force, incontinence and gross  hematuria.   PHYSICAL EXAMINATION:  VITAL SIGNS:  Blood pressure supine 80 systolic,  upright 75 systolic, heart rate 80, temperature is 97.6, the weight is 187.  GENERAL:  No distress.  The patient is reporting that he does not feel well,  but does not appear acutely ill.  SKIN:  Not diaphoretic.  LYMPHATICS:  There is no palpable lymphadenopathy.  HEENT:  Head is atraumatic.  Sclerae nonicteric.  Pharynx clear.  NECK:  Supple.  CHEST:  Clear to auscultation.  CARDIOVASCULAR:  No JVD.  No edema.  Regular rate and rhythm.  No murmur.  Pedal pulses are intact.  ABDOMEN:  Soft, nontender.  No hepatosplenomegaly.  No mass.  GENITALIA:  No abnormality is seen.  RECTAL:  Gross blood is seen which is Hemoccult positive.  There is no  rectal mass.  EXTREMITIES:  Mild osteoarthritic changes.  NEUROLOGIC:  Alert, well oriented.  Gait is observed in the office to be  normal.   LABORATORY DATA:  Urinalysis in the office positive for large amount of  blood, small amount of wbc and 2+ protein.   IMPRESSION:  1. Hypotension possibly due to bloody diarrhea.  2. Bloody diarrhea of uncertain etiology.  3. Hematuria of uncertain etiology.  4. Type 2 diabetes.  5. Other chronic medical  problems as noted above.   PLAN:  1. Admit to [facility], telemetry.  2. Intravenous fluid.  3. Consult gastroenterology.  4. Check CBC, prothrombin time and other labs.  5. We will give p.r.n. insulin for now.  6. Hold Amaryl for safety reasons.  7. _________ Avandia.  8. I discussed code status with the patient.  The patient states he would     not want to start nor remain on artificial life support systems if there     was not a reasonable chance of a functional recovery.                                               Sean A. Everardo All, M.D. [facility]    SAE/MEDQ  D:  04/12/2003  T:  04/12/2003  Job:  [id number]

## 2011-05-11 NOTE — Op Note (Signed)
[place]. [facility]  Patient:    Zachary Harris, Zachary Harris                      MRN: [id number] Proc. Date: 10/15/00 Adm. Date:  [id number] Attending:  Drema Pry CC:         Corwin Levins, M.D. [facility]   Operative Report  PREOPERATIVE DIAGNOSES: 1. Left shoulder impingement syndrome. 2. Acromioclavicular joint arthritis. 3. Rule out rotator cuff tear.  POSTOPERATIVE DIAGNOSES: 1. Left shoulder impingement syndrome with subdeltoid bursitis. 2. Acromioclavicular joint arthritis. 3. Partial thickness rotator cuff tear, not through-and-through. 4. Glenoid labrum degenerative tearing without superior labrum anterior    and posterior lesion detachment with intact biceps.  OPERATION: 1. Left shoulder operative arthroscopy with subacromial arch decompression and    acromioplasty. 2. Distal clavicle resection. 3. Debridement of partial thickness rotator cuff tear. 4. Debridement of glenoid labrum degenerative tearing. 5. Subdeltoid bursectomy.  SURGEON:  Jearld Adjutant, M.D.  ASSISTANT:  Burnadette Peter, P.A.-C.  ANESTHESIA:  General endotracheal.  CULTURES:  None.  DRAINS:  None.  ESTIMATED BLOOD LOSS:  Minimal.  PATHOLOGIC FINDINGS AND HISTORY:  The patient is a 77 year old male who came into me with shoulder problems of a years duration on February 28, 2000.  He had a type 2-3 acromion, _______ change of the AC joint.  We injected him in the [facility] joint and subacromial.  Started him on exercises and anti-inflammatories. He came back on March 20, 2000.  He had improved in the [facility] joint, improved subacromial, but only 50%, and we reinjected that area with Cortisone and Marcaine.  By April 15, 2000, he had resolved his symptoms and was doing well. By May 27, 2000, his [facility] joint arthritis was stable, but he was still having some impingement signs.  At that point, he was stable with it and did not desire anything more.  By October 04, 2000, the shoulder  started bothering him again.  He was ready to have something done.  I repeated the 35 degree _____ acromial view which showed a type 2-3 acromion and an under surface spur of the AC joint.  He had positive impingement signs and desired to proceed with operative intervention.  At surgery, we found a sharp cry in the anterior acromion, obviously arthritic distal clavicle, and shredded AC and meniscus.  There was thick subdeltoid bursitis and he had a partial thickness rotator cuff tear at least two-thirds the way through the tendon in the critical zone, but it was not through-and-through, so we debrided the edges, and used the ablator to smooth it off.  Good decompression was carried out.  He had anterior glenoid labral fraying, some looseness anteriorly, but not superiorly underneath the biceps. The biceps was otherwise intact, and glenohumeral joint otherwise looked good. The under surface of the rotator cuff was reddened, but no through-and-through tear.  DESCRIPTION OF PROCEDURE:  With adequate anesthesia obtained using endotracheal technique and 1 g of Ancef given IV prophylaxis, the patient was placed in the supine beach chair position.  The left shoulder was prepped and draped in the standard fashion.  Skin markings were made for anatomic positioning.  We then placed 20 cc of 0.5% Marcaine with epinephrine in the subacromial space to open it up.  The posterior portal was established into the joint and anterior portal was established just lateral to the coracoid. We then probed the shoulder in the SLAP area.  I then  brought in the shaver and trimmed the superior glenoid labrum.  I then reversed the portals.  We had used the ablator to further smooth it, and inspected the under surface of the rotator cuff.  I then entered the subacromial space from the posterior portal. There was a lot of shagginess to the soft tissue and the anterior under surface of the acromion.  This was trimmed  with the shaver and an ablator.  I then took a rotary bur and completed acromioplasty to the roof of the subacromial space with the manner of Caspari.  I then turned the scope sideways into the anterior portal, took a basket, and resected the AC meniscus, shaved on it further, used the ablator to smooth it, and then took the aggressive bur in from the anterior portal and completed distal clavicle resection two shaverbreadths in.  I then entered the shoulder from the anterolateral portal.  I completed the acromioplasty back to bicortical bone in the manner of Caspari.  I then looked at the rotator cuff, probed it.  I checked for the through-and-through tearing, and saw a rather large erosion into it, but felt it was best treated by debridement and ablation on the surface.  This was done with abduction, internal and external rotation in neutral position.  When I was satisfied with the resection, the shoulder was irrigated through the scope, 0.5% Marcaine was injected in and about the portals.  The portals were left open.  A bulky sterile compressive dressing was applied with sling, and the patient having tolerated the procedure well was awakened and taken to the recovery room in satisfactory condition for routine postoperative care, to be discharged.  Told to call the office for an appointment for recheck tomorrow. His preoperative laboratory data was within normal limits for surgical intervention. DD:  10/15/00 TD:  10/15/00 Job: [id number]

## 2011-05-11 NOTE — Assessment & Plan Note (Signed)
[facility]                              CARDIOLOGY OFFICE NOTE   YER, OLIVENCIA                      MRN:          [id number]  DATE:09/05/2006                            DOB:          Mar 16, 1923    Mr. 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  He had residual disease in the diagonal vein graft.  Please  see my note from August 07, 2006.   We performed an adenosine Myoview to follow-up on this residual disease on  August 15, 2006.  EF was 56% with akinesia of the inferior wall.  There was  a prior inferior wall infarct, very minimal peri-infarct ischemia at the  apex, but no suggestion of lateral wall ischemia.  We continued medical  therapy.   He has started smoking again.  He would like Chantix.   Medicines are unchanged since last visit.   PHYSICAL EXAMINATION:  VITAL SIGNS:  Blood pressure is 116/66, his pulse is  73 and regular, his weight is 179, down 7 pounds.  The rest of the exam is unchanged.   ASSESSMENT/PLAN:  Mr. Kleven is stable after an inferior wall infarct in  August.  His follow-up scan shows no significant lateral wall ischemia in  the vein graft to his diagonal.  He has normal left ventricular systolic  function.  He needs to stop smoking.   PLAN:  1. Continue current meds.  2. Add Chantix protocol.  3. See him back in 4 months.                               Thomas C. Daleen Squibb, MD, [facility]    TCW/MedQ  DD:  09/05/2006  DT:  09/06/2006  Job #:  [id number]   cc:   Corwin Levins, MD

## 2011-05-11 NOTE — Discharge Summary (Signed)
   NAME:  Zachary Harris, PINE NO.:  [id number]   MEDICAL RECORD NO.:  [id number]                   PATIENT TYPE:  INP   LOCATION:  0484                                 FACILITY:  [facility]   PHYSICIAN:  Rosalyn Gess. Norins, M.D. [facility]         DATE OF BIRTH:  1923/09/22   DATE OF ADMISSION:  02/18/2003  DATE OF DISCHARGE:  02/22/2003                                 DISCHARGE SUMMARY   ADMISSION DIAGNOSIS:  Abdominal pain with acute appendicitis.   DISCHARGE DIAGNOSIS:  Appendicitis, status post open appendectomy.   HISTORY OF PRESENT ILLNESS AND HOSPITAL COURSE:  The patient is a 77-year-  old gentleman who presented to the office with moderate pain across the  lower abdomen.  He had significant tenderness on palpation.  He was seen by  Dr. Everardo All and referred to the ER.  He was subsequently seen immediately by  surgery and taken to the OR for open appendectomy.  The patient had a CT  scan of the abdomen and pelvis which did demonstrate findings consistent  with acute appendicitis.   The patient did well post-surgical.  He had no significant pain or  discomfort.  He was afebrile.  With the patient's wounds doing well, with  him being afebrile, and taking in diet, he is felt to be stable and ready  for discharge, and is cleared by surgery.   DISCHARGE PHYSICAL EXAMINATION:  VITAL SIGNS:  The patient is afebrile,  blood pressure 122/63.  GENERAL:  A pleasant gentleman laying in bed in no acute distress.  ABDOMEN:  The patient has positive bowel sounds in all four quadrants.  His  abdomen is soft.  His wound in the right lower quadrant is well healed with  staples in place.  No erythema, no exudates.   DISPOSITION:  The patient is discharged home.  Is cleared by surgery.   DISCHARGE MEDICATIONS:  He will continue on his pre-admission medications,  including:  1. Avandia 8 mg daily.  2. Zoloft 50 mg daily.  3. Lopressor 25 mg b.i.d.  4. Amaryl 4 mg daily.  5.  Restoril p.r.n.  6. He will be given hydrocodone for pain.    FOLLOWUP:  1. The patient is to see Dr. Darnell Level for hospital followup.  2. He is to see Dr. Romero Belling in two weeks for routine followup.                                                  Rosalyn Gess Norins, M.D. [facility]    MEN/MEDQ  D:  02/22/2003  T:  02/22/2003  Job:  [id number]   cc:   Velora Heckler, M.D.  [address] [place]  [place] [address]  Fax: [phone]

## 2011-05-11 NOTE — Assessment & Plan Note (Signed)
[facility]                                   ON-CALL NOTE   Zachary Harris, Zachary Harris                        MRN:          [id number]  DATE:07/26/2006                            DOB:          1923-12-19    PRIMARY CARE PHYSICIAN:  Jesse Sans. Wall, MD, [facility]   Zachary Harris called because he was having severe chest pain.  He stated that he  had a colonoscopy and endoscopy yesterday.  He stated that after the  procedure he had begun having this pain which he said was gas.  He did not  try any home medications for it.  He called the GI physician who recommended  Prilosec which he said he took, but the pain did not improve.  He did not  think it was angina, he thought it was gas, but he was not sure.  His wife  stated that he had been having these symptoms and was concerned for him.   I told Zachary Harris that there was no way to be sure if the chest pain was  cardiac or GI unless he came to the emergency room for evaluation.  I  recommended that he call 911 and come by EMS.  I discussed this with the  patient and his wife.  The patient was reluctant to do this, but the wife  stated they would comply.                                   Theodore Demark, PA-C                                Farris Has. Dorethea Clan, MD   RB/MedQ  DD:  07/26/2006  DT:  07/27/2006  Job #:  [id number]

## 2011-05-11 NOTE — Consult Note (Signed)
NAME:  Zachary Harris, Zachary Harris NO.:  [id number]   MEDICAL RECORD NO.:  [id number]                   PATIENT TYPE:  INP   LOCATION:  0484                                 FACILITY:  [facility]   PHYSICIAN:  Velora Heckler, M.D.                DATE OF BIRTH:  [birth year]   DATE OF CONSULTATION:  02/18/2003  DATE OF DISCHARGE:                                   CONSULTATION   REASON FOR CONSULTATION:  Abdominal pain.   REFERRING PHYSICIAN:  Sean A. Everardo All, M.D.   HISTORY OF PRESENT ILLNESS:  The patient is a pleasant 77 year old white  male who developed abdominal pain on the morning of 02/18/03.  This was  associated with anorexia.  The patient denies fevers, chills, nausea or  vomiting.  Pain persisted and gradually localized to the right lower  quadrant.  The patient presented to Dr. Romero Belling where he was evaluated  in the office and then sent to [facility] for admission and  further workup.  White blood cell count was elevated at 14,000.  CT scan of  the abdomen and pelvis was obtained, and I reviewed it this evening with Dr.  Maryelizabeth Rowan demonstrating findings consistent with acute appendicitis.  General surgery is now called for evaluation and management of acute  appendicitis.   PAST MEDICAL HISTORY:  1. Status post total knee replacement.  2. Status post coronary artery bypass grafting.  3. Status post cholecystectomy.  4. History of type 2 diabetes.  5. History of hypertension.   MEDICATIONS:  1. Avandia 8 mg daily.  2. Coreg 20 mg daily.  3. Altace 20 mg daily.  4. Zoloft 50 mg daily.  5. Lopressor 25 mg b.i.d.  6. Ecotrin.  7. Amaryl.   ALLERGIES:  No known drug allergies.   SOCIAL HISTORY:  The patient lives with his wife here in [place].  He is  accompanied tonight by a friend.  He does not drink alcohol.  He smokes one  pack of cigarettes per day.   REVIEW OF SYMPTOMS:  Fifteen system review notable for the above  findings.  Also has an abdominal wall hernia which has been present for 30 years.  He  also sustained a broken leg as a kid, and a shoulder separation in 2003.   PHYSICAL EXAMINATION:  GENERAL:  A 77 year old bright alert white male in a  wheelchair in the radiology department.  VITAL SIGNS:  Temperature 97.8, pulse 85, respirations 18, blood pressure  160/77, weight 196 pounds.  HEENT:  Normocephalic.  Sclerae are clear.  Dentition is fair.  Voice  quality is normal.  NECK:  Supple without mass.  Thyroid is normal without nodularity.  LUNGS:  Clear to auscultation.  CARDIAC:  Regular rate and rhythm without murmur.  ABDOMEN:  Soft, there are a few bowel sounds present.  However, there is  significant tenderness at McBurney's  point.  There is voluntary guarding.  There is rebound tenderness in the right lower quadrant.  There is a ventral  midline hernia which is soft and nontender.  EXTREMITIES:  Nontender without edema.  NEUROLOGIC:  The patient is alert and oriented without focal neurological  deficit.   LABORATORY DATA:  CT scan of the abdomen and pelvis was reviewed with Dr.  Maryelizabeth Rowan.  Findings are consistent with acute appendicitis with what  appears to be a gangrenous appendix.  It is retrocecal in location.  There  is an imprint on the cecal wall.  These findings make it difficult to  proceed with laparoscopic surgery.   IMPRESSION:  Acute appendicitis, ruptured cecum, likely gangrenous.   PLAN:  We will proceed immediately to the operating room with the patient.  I have discussed this with the patient and with his friend who accompanies  him.  He understands the procedure.  We will perform this under general  anesthesia.                                               Velora Heckler, M.D.    TMG/MEDQ  D:  02/18/2003  T:  02/19/2003  Job:  [id number]   cc:   Gregary Signs A. Everardo All, M.D. [facility]   Corwin Levins, M.D. [facility]

## 2011-05-30 ENCOUNTER — Ambulatory Visit (INDEPENDENT_AMBULATORY_CARE_PROVIDER_SITE_OTHER): Payer: Medicare Other | Admitting: *Deleted

## 2011-05-30 DIAGNOSIS — Z7901 Long term (current) use of anticoagulants: Secondary | ICD-10-CM

## 2011-05-30 DIAGNOSIS — I82409 Acute embolism and thrombosis of unspecified deep veins of unspecified lower extremity: Secondary | ICD-10-CM

## 2011-05-30 LAB — POCT INR: INR: 2

## 2011-06-05 DIAGNOSIS — L821 Other seborrheic keratosis: Secondary | ICD-10-CM

## 2011-06-05 HISTORY — DX: Other seborrheic keratosis: L82.1

## 2011-06-06 ENCOUNTER — Other Ambulatory Visit: Payer: Self-pay | Admitting: Internal Medicine

## 2011-06-06 ENCOUNTER — Other Ambulatory Visit: Payer: Self-pay

## 2011-06-06 DIAGNOSIS — Z1289 Encounter for screening for malignant neoplasm of other sites: Secondary | ICD-10-CM

## 2011-06-06 DIAGNOSIS — Z0000 Encounter for general adult medical examination without abnormal findings: Secondary | ICD-10-CM

## 2011-06-08 DIAGNOSIS — F411 Generalized anxiety disorder: Secondary | ICD-10-CM

## 2011-06-13 ENCOUNTER — Encounter: Payer: Self-pay | Admitting: Internal Medicine

## 2011-06-20 ENCOUNTER — Encounter: Payer: Medicare Other | Admitting: *Deleted

## 2011-06-21 ENCOUNTER — Ambulatory Visit (INDEPENDENT_AMBULATORY_CARE_PROVIDER_SITE_OTHER): Payer: Medicare Other | Admitting: *Deleted

## 2011-06-21 DIAGNOSIS — I82409 Acute embolism and thrombosis of unspecified deep veins of unspecified lower extremity: Secondary | ICD-10-CM

## 2011-06-21 DIAGNOSIS — Z7901 Long term (current) use of anticoagulants: Secondary | ICD-10-CM

## 2011-06-21 LAB — POCT INR: INR: 3.4

## 2011-07-11 ENCOUNTER — Ambulatory Visit (INDEPENDENT_AMBULATORY_CARE_PROVIDER_SITE_OTHER): Payer: Medicare Other | Admitting: *Deleted

## 2011-07-11 DIAGNOSIS — I82409 Acute embolism and thrombosis of unspecified deep veins of unspecified lower extremity: Secondary | ICD-10-CM

## 2011-07-11 DIAGNOSIS — Z7901 Long term (current) use of anticoagulants: Secondary | ICD-10-CM

## 2011-07-12 ENCOUNTER — Encounter: Payer: Medicare Other | Admitting: *Deleted

## 2011-08-01 ENCOUNTER — Ambulatory Visit (INDEPENDENT_AMBULATORY_CARE_PROVIDER_SITE_OTHER): Payer: Medicare Other | Admitting: *Deleted

## 2011-08-01 DIAGNOSIS — I82409 Acute embolism and thrombosis of unspecified deep veins of unspecified lower extremity: Secondary | ICD-10-CM

## 2011-08-01 DIAGNOSIS — Z7901 Long term (current) use of anticoagulants: Secondary | ICD-10-CM

## 2011-08-01 LAB — PACEMAKER DEVICE OBSERVATION
AL AMPLITUDE: 5 mv
ATRIAL PACING PM: 1
BAMS-0001: 180 {beats}/min
BATTERY VOLTAGE: 3.1434 V
DEVICE MODEL PM: 7228315
RV LEAD THRESHOLD: 0.5 V
VENTRICULAR PACING PM: 1

## 2011-08-01 LAB — POCT INR: INR: 2.6

## 2011-08-22 ENCOUNTER — Ambulatory Visit (INDEPENDENT_AMBULATORY_CARE_PROVIDER_SITE_OTHER): Payer: Medicare Other | Admitting: *Deleted

## 2011-08-22 DIAGNOSIS — Z7901 Long term (current) use of anticoagulants: Secondary | ICD-10-CM

## 2011-08-22 DIAGNOSIS — I82409 Acute embolism and thrombosis of unspecified deep veins of unspecified lower extremity: Secondary | ICD-10-CM

## 2011-08-22 LAB — POCT INR: INR: 2.6

## 2011-09-12 ENCOUNTER — Ambulatory Visit (INDEPENDENT_AMBULATORY_CARE_PROVIDER_SITE_OTHER): Payer: Medicare Other | Admitting: *Deleted

## 2011-09-12 DIAGNOSIS — I82409 Acute embolism and thrombosis of unspecified deep veins of unspecified lower extremity: Secondary | ICD-10-CM

## 2011-09-12 DIAGNOSIS — Z7901 Long term (current) use of anticoagulants: Secondary | ICD-10-CM

## 2011-09-12 LAB — POCT INR: INR: 3.9

## 2011-09-26 ENCOUNTER — Ambulatory Visit (INDEPENDENT_AMBULATORY_CARE_PROVIDER_SITE_OTHER): Payer: Medicare Other | Admitting: *Deleted

## 2011-09-26 DIAGNOSIS — Z7901 Long term (current) use of anticoagulants: Secondary | ICD-10-CM

## 2011-09-26 DIAGNOSIS — I82409 Acute embolism and thrombosis of unspecified deep veins of unspecified lower extremity: Secondary | ICD-10-CM

## 2011-09-28 ENCOUNTER — Ambulatory Visit: Payer: Medicare Other | Admitting: Cardiology

## 2011-10-08 ENCOUNTER — Other Ambulatory Visit: Payer: Self-pay | Admitting: Cardiology

## 2011-10-10 ENCOUNTER — Ambulatory Visit (INDEPENDENT_AMBULATORY_CARE_PROVIDER_SITE_OTHER): Payer: Medicare Other | Admitting: *Deleted

## 2011-10-10 DIAGNOSIS — Z7901 Long term (current) use of anticoagulants: Secondary | ICD-10-CM

## 2011-10-10 DIAGNOSIS — I82409 Acute embolism and thrombosis of unspecified deep veins of unspecified lower extremity: Secondary | ICD-10-CM

## 2011-10-16 ENCOUNTER — Encounter (INDEPENDENT_AMBULATORY_CARE_PROVIDER_SITE_OTHER): Payer: Medicare Other | Admitting: *Deleted

## 2011-10-16 ENCOUNTER — Encounter: Payer: Self-pay | Admitting: Cardiology

## 2011-10-16 ENCOUNTER — Ambulatory Visit (INDEPENDENT_AMBULATORY_CARE_PROVIDER_SITE_OTHER): Payer: Medicare Other | Admitting: Cardiology

## 2011-10-16 VITALS — BP 118/70 | HR 72 | Ht 69.5 in | Wt 203.0 lb

## 2011-10-16 DIAGNOSIS — I6529 Occlusion and stenosis of unspecified carotid artery: Secondary | ICD-10-CM

## 2011-10-16 DIAGNOSIS — I2510 Atherosclerotic heart disease of native coronary artery without angina pectoris: Secondary | ICD-10-CM

## 2011-10-16 DIAGNOSIS — R609 Edema, unspecified: Secondary | ICD-10-CM

## 2011-10-16 DIAGNOSIS — I82409 Acute embolism and thrombosis of unspecified deep veins of unspecified lower extremity: Secondary | ICD-10-CM

## 2011-10-16 DIAGNOSIS — Z7901 Long term (current) use of anticoagulants: Secondary | ICD-10-CM

## 2011-10-16 DIAGNOSIS — E785 Hyperlipidemia, unspecified: Secondary | ICD-10-CM

## 2011-10-16 DIAGNOSIS — I10 Essential (primary) hypertension: Secondary | ICD-10-CM

## 2011-10-16 DIAGNOSIS — I252 Old myocardial infarction: Secondary | ICD-10-CM

## 2011-10-16 NOTE — Assessment & Plan Note (Signed)
Stable. No change in treatment. 

## 2011-10-16 NOTE — Progress Notes (Signed)
HPI Zachary Harris comes in today for evaluation and management of his multiple cardiac and vascular issues.  Regardless of this, he continues to do well. 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 He denies any angina or need for nitroglycerin. He does carry it.  He is on no hypertensive medicines at present his blood pressure is good. He has a history of orthostatic hypotension.  Laboratory data reviewed his LDL is at goal.  He is having no symptoms of TIAs or mini strokes. He  He does have chronic edema but does not bother him. He is on pioglitazone. He has a history of DVT on anticoagulation. Past Medical History  Diagnosis Date  . ANEMIA-IRON DEFICIENCY 05/27/2007  . ANXIETY 11/14/2007  . BACK PAIN 09/05/2009  . BENIGN PROSTATIC HYPERTROPHY 11/14/2007  . CAROTID ARTERY DISEASE 10/02/2010  . CELLULITIS, FACE 02/02/2008  . COLONIC POLYPS, HX OF 05/27/2007  . CORONARY ARTERY DISEASE 11/14/2007  . DEPRESSION 11/14/2007  . DIABETES MELLITUS, TYPE II 11/14/2007  . DIVERTICULITIS, HX OF 05/27/2007  . DVT (deep venous thrombosis) 03/21/2011  . Edema 01/15/2011  . FOOT PAIN, BILATERAL 12/08/2010  . GERD 11/14/2007  . GROIN PAIN 09/24/2008  . HYPERLIPIDEMIA 11/14/2007  . HYPERTENSION 11/14/2007  . HYPOTENSION, ORTHOSTATIC 03/06/2011  . Long term current use of anticoagulant 03/21/2011  . Memory loss 06/01/2008  . MYOCARDIAL INFARCTION, HX OF 11/14/2007  . OSTEOPOROSIS 06/02/2010  . PERIPHERAL VASCULAR DISEASE 11/14/2007  . RASH-NONVESICULAR 01/22/2008  . RENAL INSUFFICIENCY 11/14/2007  . SECONDARY DM W/PERIPHERAL CIRC D/O UNCONTROLLED 11/07/2010  . SYNCOPE 09/08/2008  . Unspecified hearing loss 05/13/2009  . VERTEBRAL FRACTURE 09/05/2009    Past Surgical History  Procedure Date  . Coronary artery bypass graft   . S/p coronary stent x 1   . Cholecystectomy   . S/p bilat knee replacement 1992  . Pacemaker insertion 2012    Family History  Problem Relation Age of Onset  .  Diabetes Father     2    History   Social History  . Marital Status: Married    Spouse Name: N/A    Number of Children: 2  . Years of Education: N/A   Occupational History  . retired operated Conservation officer, historic buildings    Social History Main Topics  . Smoking status: Former Games developer  . Smokeless tobacco: Not on file  . Alcohol Use: No  . Drug Use: Not on file  . Sexually Active: Not on file   Other Topics Concern  . Not on file   Social History Narrative  . No narrative on file    No Known Allergies  Current Outpatient Prescriptions  Medication Sig Dispense Refill  . alendronate (FOSAMAX) 70 MG tablet Take 70 mg by mouth every 7 (seven) days. Take with a full glass of water on an empty stomach.       Marland Kitchen atorvastatin (LIPITOR) 40 MG tablet Take 40 mg by mouth daily.        . fenofibrate 160 MG tablet Take 160 mg by mouth daily.        Marland Kitchen Fesoterodine Fumarate (TOVIAZ) 8 MG TB24 Take 8 mg by mouth daily.        Marland Kitchen glucose blood (ONE TOUCH ULTRA TEST) test strip Use as directed once daily       . ONE TOUCH LANCETS MISC Use as directed once daily       . pioglitazone (ACTOS) 15 MG tablet Take 15 mg by mouth  daily.        . warfarin (COUMADIN) 2.5 MG tablet Take as directed by Anticoagulation clinic  150 tablet  1    ROS Negative other than HPI.   PE General Appearance: well developed, well nourished in no acute distress HEENT: symmetrical face, PERRLA, good dentition  Neck: no JVD, thyromegaly, or adenopathy, trachea midline Chest: symmetric without deformity Cardiac: PMI non-displaced, RRR, normal S1, S2, no gallop or murmur Lung: clear to ausculation and percussion Vascular: all pulses full without bruits  Abdominal: nondistended, nontender, good bowel sounds, no HSM, no bruits Extremities: no cyanosis, 1+ pretibial edema, no sign of DVT, no varicosities  Skin: normal color, no rashes Neuro: alert and oriented x 3, non-focal Pysch: normal affect Filed Vitals:    10/16/11 0954  BP: 118/70  Pulse: 72  Height: 5' 9.5" (1.765 m)  Weight: 203 lb (92.08 kg)    EKG Normal sinus rhythm, old inferior Azarria Balint infarct. Labs and Studies Reviewed.   Lab Results  Component Value Date   WBC 6.4 03/20/2011   HGB 13.7 03/20/2011   HCT 41.7 03/20/2011   MCV 92.5 03/20/2011   PLT 197 03/20/2011      Chemistry      Component Value Date/Time   NA 136 03/20/2011 0519   K 4.1 03/20/2011 0519   CL 106 03/20/2011 0519   CO2 23 03/20/2011 0519   BUN 23 03/20/2011 0519   CREATININE 1.24 03/20/2011 0519      Component Value Date/Time   CALCIUM 8.7 03/20/2011 0519   CALCIUM 9.5 06/02/2010 2048   ALKPHOS 47 03/15/2011 0926   AST 19 03/15/2011 0926   ALT 14 03/15/2011 0926   BILITOT 0.8 03/15/2011 0926       Lab Results  Component Value Date   CHOL 126 03/06/2011   CHOL 139 12/01/2010   CHOL 187 07/19/2010   Lab Results  Component Value Date   HDL 31.70* 03/06/2011   HDL 27.20* 12/01/2010   HDL 27.70* 07/19/2010   Lab Results  Component Value Date   LDLCALC 79 03/06/2011   LDLCALC 77 12/01/2010   LDLCALC 83 06/02/2010   Lab Results  Component Value Date   TRIG 78.0 03/06/2011   TRIG 174.0* 12/01/2010   TRIG 403.0* 07/19/2010   Lab Results  Component Value Date   CHOLHDL 4 03/06/2011   CHOLHDL 5 12/01/2010   CHOLHDL 7 07/19/2010   Lab Results  Component Value Date   HGBA1C 6.6* 03/06/2011   Lab Results  Component Value Date   ALT 14 03/15/2011   AST 19 03/15/2011   ALKPHOS 47 03/15/2011   BILITOT 0.8 03/15/2011   Lab Results  Component Value Date   TSH 2.65 03/06/2011

## 2011-10-16 NOTE — Assessment & Plan Note (Signed)
He is due carotid Dopplers. We'll arrange. No change in treatment.

## 2011-10-16 NOTE — Patient Instructions (Signed)
Your physician has requested that you have a carotid duplex. This test is an ultrasound of the carotid arteries in your neck. It looks at blood flow through these arteries that supply the brain with blood. Allow one hour for this exam. There are no restrictions or special instructions.  Your physician wants you to follow-up in: 1 year with Dr. Daleen Squibb. You will receive a reminder letter in the mail two months in advance. If you don't receive a letter, please call our office to schedule the follow-up appointment.

## 2011-10-18 ENCOUNTER — Ambulatory Visit (INDEPENDENT_AMBULATORY_CARE_PROVIDER_SITE_OTHER): Payer: Medicare Other | Admitting: Internal Medicine

## 2011-10-18 ENCOUNTER — Encounter: Payer: Self-pay | Admitting: Internal Medicine

## 2011-10-18 DIAGNOSIS — I951 Orthostatic hypotension: Secondary | ICD-10-CM

## 2011-10-18 DIAGNOSIS — I442 Atrioventricular block, complete: Secondary | ICD-10-CM

## 2011-10-18 DIAGNOSIS — R55 Syncope and collapse: Secondary | ICD-10-CM

## 2011-10-18 DIAGNOSIS — Z950 Presence of cardiac pacemaker: Secondary | ICD-10-CM

## 2011-10-18 DIAGNOSIS — I441 Atrioventricular block, second degree: Secondary | ICD-10-CM

## 2011-10-18 LAB — PACEMAKER DEVICE OBSERVATION
AL AMPLITUDE: 5 mv
AL IMPEDENCE PM: 437.5 Ohm
BATTERY VOLTAGE: 2.9779 V
RV LEAD AMPLITUDE: 12 mv

## 2011-10-18 NOTE — Patient Instructions (Signed)
Your physician wants you to follow-up in: 1 year with Dr. Klein. You will receive a reminder letter in the mail two months in advance. If you don't receive a letter, please call our office to schedule the follow-up appointment.  Your physician recommends that you continue on your current medications as directed. Please refer to the Current Medication list given to you today.  

## 2011-10-18 NOTE — Assessment & Plan Note (Signed)
Stable. He is pacing only about 1% of the time

## 2011-10-18 NOTE — Progress Notes (Signed)
  HPI  Zachary Harris is a 77 y.o. male Seen in followup for pacemaker implanted for high-grade heart block and recurrent syncope. He also had orthostatic hypotension potentially contributing to his syncope, this potentially associated with diabetic autonomic neuropathy  His history of ischemic heart disease with prior bypass grafting and depressed left ventricular function with an EF of 40-45%. He also has hypertension  He has had no recurrent syncope  Past Medical History  Diagnosis Date  . ANEMIA-IRON DEFICIENCY 05/27/2007  . ANXIETY 11/14/2007  . BACK PAIN 09/05/2009  . BENIGN PROSTATIC HYPERTROPHY 11/14/2007  . CAROTID ARTERY DISEASE 10/02/2010  . CELLULITIS, FACE 02/02/2008  . COLONIC POLYPS, HX OF 05/27/2007  . CORONARY ARTERY DISEASE 11/14/2007  . DEPRESSION 11/14/2007  . DIABETES MELLITUS, TYPE II 11/14/2007  . DIVERTICULITIS, HX OF 05/27/2007  . DVT (deep venous thrombosis) 03/21/2011  . Edema 01/15/2011  . FOOT PAIN, BILATERAL 12/08/2010  . GERD 11/14/2007  . GROIN PAIN 09/24/2008  . HYPERLIPIDEMIA 11/14/2007  . HYPERTENSION 11/14/2007  . HYPOTENSION, ORTHOSTATIC 03/06/2011  . Long term current use of anticoagulant 03/21/2011  . Memory loss 06/01/2008  . MYOCARDIAL INFARCTION, HX OF 11/14/2007  . OSTEOPOROSIS 06/02/2010  . PERIPHERAL VASCULAR DISEASE 11/14/2007  . RASH-NONVESICULAR 01/22/2008  . RENAL INSUFFICIENCY 11/14/2007  . SECONDARY DM W/PERIPHERAL CIRC D/O UNCONTROLLED 11/07/2010  . SYNCOPE 09/08/2008  . Unspecified hearing loss 05/13/2009  . VERTEBRAL FRACTURE 09/05/2009    Past Surgical History  Procedure Date  . Coronary artery bypass graft   . S/p coronary stent x 1   . Cholecystectomy   . S/p bilat knee replacement 1992  . Pacemaker insertion 2012    Current Outpatient Prescriptions  Medication Sig Dispense Refill  . alendronate (FOSAMAX) 70 MG tablet Take 70 mg by mouth every 7 (seven) days. Take with a full glass of water on an empty stomach.       Marland Kitchen  atorvastatin (LIPITOR) 40 MG tablet Take 40 mg by mouth daily.        . fenofibrate 160 MG tablet Take 160 mg by mouth daily.        Marland Kitchen Fesoterodine Fumarate (TOVIAZ) 8 MG TB24 Take 8 mg by mouth daily.        Marland Kitchen glucose blood (ONE TOUCH ULTRA TEST) test strip Use as directed once daily       . nitroGLYCERIN (NITROSTAT) 0.4 MG SL tablet Place 0.4 mg under the tongue every 5 (five) minutes as needed.        . ONE TOUCH LANCETS MISC Use as directed once daily       . pioglitazone (ACTOS) 15 MG tablet Take 15 mg by mouth daily.        Marland Kitchen warfarin (COUMADIN) 2.5 MG tablet Take as directed by Anticoagulation clinic  150 tablet  1    No Known Allergies  Review of Systems negative except from HPI and PMH  Physical Exam Well developed and well nourished in no acute distress HENT normal E scleral and icterus clear Neck Supple JVP flat; carotids brisk and full pacemaker pocket well-healed Clear to ausculation Regular rate and rhythm, no murmurs gallops or rub Soft with active bowel sounds No clubbing cyanosis 2+ edemaAlert and oriented, grossly normal motor and sensory function Skin Warm and Dry    Assessment and  Plan

## 2011-10-18 NOTE — Assessment & Plan Note (Signed)
No recurrent syncope 

## 2011-10-18 NOTE — Assessment & Plan Note (Signed)
Improved likely contributed to by his medications

## 2011-10-18 NOTE — Assessment & Plan Note (Signed)
The patient's device was interrogated and the information was fully reviewed.  The device was reprogrammed to maximize longevity  

## 2011-10-19 ENCOUNTER — Ambulatory Visit: Payer: Medicare Other | Admitting: Cardiology

## 2011-10-25 ENCOUNTER — Ambulatory Visit (INDEPENDENT_AMBULATORY_CARE_PROVIDER_SITE_OTHER): Payer: Medicare Other | Admitting: *Deleted

## 2011-10-25 ENCOUNTER — Encounter: Payer: Medicare Other | Admitting: *Deleted

## 2011-10-25 DIAGNOSIS — Z7901 Long term (current) use of anticoagulants: Secondary | ICD-10-CM

## 2011-10-25 DIAGNOSIS — I82409 Acute embolism and thrombosis of unspecified deep veins of unspecified lower extremity: Secondary | ICD-10-CM

## 2011-11-08 ENCOUNTER — Ambulatory Visit (INDEPENDENT_AMBULATORY_CARE_PROVIDER_SITE_OTHER): Payer: Medicare Other | Admitting: *Deleted

## 2011-11-08 DIAGNOSIS — Z7901 Long term (current) use of anticoagulants: Secondary | ICD-10-CM

## 2011-11-08 DIAGNOSIS — I82409 Acute embolism and thrombosis of unspecified deep veins of unspecified lower extremity: Secondary | ICD-10-CM

## 2011-11-27 ENCOUNTER — Ambulatory Visit (INDEPENDENT_AMBULATORY_CARE_PROVIDER_SITE_OTHER): Payer: Medicare Other | Admitting: *Deleted

## 2011-11-27 DIAGNOSIS — Z7901 Long term (current) use of anticoagulants: Secondary | ICD-10-CM

## 2011-11-27 DIAGNOSIS — I82409 Acute embolism and thrombosis of unspecified deep veins of unspecified lower extremity: Secondary | ICD-10-CM

## 2011-11-27 LAB — POCT INR: INR: 2.4

## 2011-12-20 ENCOUNTER — Ambulatory Visit (INDEPENDENT_AMBULATORY_CARE_PROVIDER_SITE_OTHER): Payer: Medicare Other | Admitting: *Deleted

## 2011-12-20 DIAGNOSIS — Z7901 Long term (current) use of anticoagulants: Secondary | ICD-10-CM

## 2011-12-20 DIAGNOSIS — I82409 Acute embolism and thrombosis of unspecified deep veins of unspecified lower extremity: Secondary | ICD-10-CM

## 2011-12-20 LAB — POCT INR: INR: 2.6

## 2012-01-10 ENCOUNTER — Ambulatory Visit (INDEPENDENT_AMBULATORY_CARE_PROVIDER_SITE_OTHER): Payer: Medicare Other | Admitting: *Deleted

## 2012-01-10 DIAGNOSIS — I82409 Acute embolism and thrombosis of unspecified deep veins of unspecified lower extremity: Secondary | ICD-10-CM

## 2012-01-10 DIAGNOSIS — Z7901 Long term (current) use of anticoagulants: Secondary | ICD-10-CM

## 2012-01-24 ENCOUNTER — Ambulatory Visit (INDEPENDENT_AMBULATORY_CARE_PROVIDER_SITE_OTHER): Payer: Medicare Other | Admitting: *Deleted

## 2012-01-24 DIAGNOSIS — R55 Syncope and collapse: Secondary | ICD-10-CM

## 2012-01-24 DIAGNOSIS — Z7901 Long term (current) use of anticoagulants: Secondary | ICD-10-CM

## 2012-01-24 DIAGNOSIS — I82409 Acute embolism and thrombosis of unspecified deep veins of unspecified lower extremity: Secondary | ICD-10-CM

## 2012-01-24 DIAGNOSIS — I441 Atrioventricular block, second degree: Secondary | ICD-10-CM

## 2012-01-25 ENCOUNTER — Encounter: Payer: Self-pay | Admitting: Internal Medicine

## 2012-01-25 LAB — REMOTE PACEMAKER DEVICE
AL IMPEDENCE PM: 460 Ohm
ATRIAL PACING PM: 1
BAMS-0001: 180 {beats}/min
BATTERY VOLTAGE: 2.99 V
DEVICE MODEL PM: 7228315
RV LEAD IMPEDENCE PM: 610 Ohm

## 2012-01-30 NOTE — Progress Notes (Signed)
Remote pacer check  

## 2012-02-07 ENCOUNTER — Encounter: Payer: Medicare Other | Admitting: *Deleted

## 2012-02-11 ENCOUNTER — Other Ambulatory Visit: Payer: Self-pay | Admitting: *Deleted

## 2012-02-11 MED ORDER — WARFARIN SODIUM 2.5 MG PO TABS: ORAL_TABLET | ORAL | Status: DC

## 2012-02-12 ENCOUNTER — Encounter: Payer: Self-pay | Admitting: *Deleted

## 2012-02-12 ENCOUNTER — Ambulatory Visit (INDEPENDENT_AMBULATORY_CARE_PROVIDER_SITE_OTHER): Payer: Medicare Other | Admitting: Pharmacist

## 2012-02-12 DIAGNOSIS — Z7901 Long term (current) use of anticoagulants: Secondary | ICD-10-CM

## 2012-02-12 DIAGNOSIS — I82409 Acute embolism and thrombosis of unspecified deep veins of unspecified lower extremity: Secondary | ICD-10-CM

## 2012-03-04 ENCOUNTER — Ambulatory Visit (INDEPENDENT_AMBULATORY_CARE_PROVIDER_SITE_OTHER): Payer: Medicare Other

## 2012-03-04 DIAGNOSIS — I82409 Acute embolism and thrombosis of unspecified deep veins of unspecified lower extremity: Secondary | ICD-10-CM

## 2012-03-04 DIAGNOSIS — Z7901 Long term (current) use of anticoagulants: Secondary | ICD-10-CM

## 2012-03-04 LAB — POCT INR: INR: 2.7

## 2012-04-02 ENCOUNTER — Ambulatory Visit (INDEPENDENT_AMBULATORY_CARE_PROVIDER_SITE_OTHER): Payer: Medicare Other | Admitting: *Deleted

## 2012-04-02 DIAGNOSIS — I82409 Acute embolism and thrombosis of unspecified deep veins of unspecified lower extremity: Secondary | ICD-10-CM

## 2012-04-02 DIAGNOSIS — Z7901 Long term (current) use of anticoagulants: Secondary | ICD-10-CM

## 2012-04-02 LAB — POCT INR: INR: 1.8

## 2012-04-24 ENCOUNTER — Ambulatory Visit (INDEPENDENT_AMBULATORY_CARE_PROVIDER_SITE_OTHER): Payer: Medicare Other | Admitting: *Deleted

## 2012-04-24 ENCOUNTER — Encounter: Payer: Self-pay | Admitting: Internal Medicine

## 2012-04-24 ENCOUNTER — Ambulatory Visit (INDEPENDENT_AMBULATORY_CARE_PROVIDER_SITE_OTHER): Payer: Medicare Other

## 2012-04-24 DIAGNOSIS — I82409 Acute embolism and thrombosis of unspecified deep veins of unspecified lower extremity: Secondary | ICD-10-CM

## 2012-04-24 DIAGNOSIS — I441 Atrioventricular block, second degree: Secondary | ICD-10-CM

## 2012-04-24 DIAGNOSIS — Z7901 Long term (current) use of anticoagulants: Secondary | ICD-10-CM

## 2012-04-24 LAB — REMOTE PACEMAKER DEVICE
AL AMPLITUDE: 3.6 mv
BAMS-0001: 180 {beats}/min
BAMS-0003: 70 {beats}/min
DEVICE MODEL PM: 7228315
RV LEAD AMPLITUDE: 12 mv
RV LEAD IMPEDENCE PM: 610 Ohm

## 2012-04-24 LAB — POCT INR: INR: 1.3

## 2012-05-02 NOTE — Progress Notes (Signed)
PPM remote 

## 2012-05-05 ENCOUNTER — Ambulatory Visit (INDEPENDENT_AMBULATORY_CARE_PROVIDER_SITE_OTHER): Payer: Medicare Other | Admitting: *Deleted

## 2012-05-05 DIAGNOSIS — I82409 Acute embolism and thrombosis of unspecified deep veins of unspecified lower extremity: Secondary | ICD-10-CM

## 2012-05-05 DIAGNOSIS — Z7901 Long term (current) use of anticoagulants: Secondary | ICD-10-CM

## 2012-05-05 LAB — POCT INR: INR: 2.1

## 2012-05-14 ENCOUNTER — Encounter: Payer: Self-pay | Admitting: *Deleted

## 2012-05-27 ENCOUNTER — Ambulatory Visit (INDEPENDENT_AMBULATORY_CARE_PROVIDER_SITE_OTHER): Payer: Medicare Other

## 2012-05-27 DIAGNOSIS — I82409 Acute embolism and thrombosis of unspecified deep veins of unspecified lower extremity: Secondary | ICD-10-CM

## 2012-05-27 DIAGNOSIS — Z7901 Long term (current) use of anticoagulants: Secondary | ICD-10-CM

## 2012-06-17 ENCOUNTER — Ambulatory Visit (INDEPENDENT_AMBULATORY_CARE_PROVIDER_SITE_OTHER): Payer: Medicare Other | Admitting: *Deleted

## 2012-06-17 DIAGNOSIS — I82409 Acute embolism and thrombosis of unspecified deep veins of unspecified lower extremity: Secondary | ICD-10-CM

## 2012-06-17 DIAGNOSIS — Z7901 Long term (current) use of anticoagulants: Secondary | ICD-10-CM

## 2012-07-02 ENCOUNTER — Encounter: Payer: Self-pay | Admitting: Internal Medicine

## 2012-07-15 ENCOUNTER — Ambulatory Visit (INDEPENDENT_AMBULATORY_CARE_PROVIDER_SITE_OTHER): Payer: Medicare Other | Admitting: *Deleted

## 2012-07-15 DIAGNOSIS — I82409 Acute embolism and thrombosis of unspecified deep veins of unspecified lower extremity: Secondary | ICD-10-CM

## 2012-07-15 DIAGNOSIS — Z7901 Long term (current) use of anticoagulants: Secondary | ICD-10-CM

## 2012-07-15 LAB — POCT INR: INR: 3.1

## 2012-07-31 ENCOUNTER — Ambulatory Visit (INDEPENDENT_AMBULATORY_CARE_PROVIDER_SITE_OTHER): Payer: Medicare Other | Admitting: *Deleted

## 2012-07-31 ENCOUNTER — Encounter: Payer: Self-pay | Admitting: Internal Medicine

## 2012-07-31 ENCOUNTER — Encounter: Payer: Self-pay | Admitting: *Deleted

## 2012-07-31 DIAGNOSIS — I441 Atrioventricular block, second degree: Secondary | ICD-10-CM

## 2012-07-31 LAB — REMOTE PACEMAKER DEVICE
AL AMPLITUDE: 3.3 mv
BAMS-0001: 180 {beats}/min
BAMS-0003: 70 {beats}/min
BATTERY VOLTAGE: 2.98 V
DEVICE MODEL PM: 7228315
RV LEAD AMPLITUDE: 12 mv

## 2012-08-07 ENCOUNTER — Encounter: Payer: Self-pay | Admitting: *Deleted

## 2012-08-12 ENCOUNTER — Ambulatory Visit (INDEPENDENT_AMBULATORY_CARE_PROVIDER_SITE_OTHER): Payer: Medicare Other | Admitting: *Deleted

## 2012-08-12 DIAGNOSIS — Z7901 Long term (current) use of anticoagulants: Secondary | ICD-10-CM

## 2012-08-12 DIAGNOSIS — I82409 Acute embolism and thrombosis of unspecified deep veins of unspecified lower extremity: Secondary | ICD-10-CM

## 2012-08-12 LAB — POCT INR: INR: 2.7

## 2012-08-20 ENCOUNTER — Telehealth: Payer: Self-pay | Admitting: Internal Medicine

## 2012-08-20 NOTE — Telephone Encounter (Signed)
New Problem:    Patient called in wanting to schedule his next remote pacer check.  Please call back.

## 2012-08-21 NOTE — Telephone Encounter (Signed)
Spoke w/pt--no transmission scheduled due to pt having an in office check. Pt aware.

## 2012-08-27 ENCOUNTER — Encounter: Payer: Self-pay | Admitting: Pharmacist

## 2012-09-21 ENCOUNTER — Encounter: Payer: Self-pay | Admitting: Internal Medicine

## 2012-09-23 ENCOUNTER — Ambulatory Visit (INDEPENDENT_AMBULATORY_CARE_PROVIDER_SITE_OTHER): Payer: Medicare Other | Admitting: *Deleted

## 2012-09-23 DIAGNOSIS — I82409 Acute embolism and thrombosis of unspecified deep veins of unspecified lower extremity: Secondary | ICD-10-CM

## 2012-09-23 DIAGNOSIS — Z7901 Long term (current) use of anticoagulants: Secondary | ICD-10-CM

## 2012-09-23 LAB — POCT INR: INR: 4

## 2012-09-30 ENCOUNTER — Encounter: Payer: Self-pay | Admitting: Cardiology

## 2012-09-30 ENCOUNTER — Ambulatory Visit (INDEPENDENT_AMBULATORY_CARE_PROVIDER_SITE_OTHER): Payer: Medicare Other | Admitting: Cardiology

## 2012-09-30 ENCOUNTER — Other Ambulatory Visit: Payer: Self-pay

## 2012-09-30 VITALS — BP 138/58 | HR 75 | Ht 69.0 in | Wt 192.0 lb

## 2012-09-30 DIAGNOSIS — I10 Essential (primary) hypertension: Secondary | ICD-10-CM

## 2012-09-30 DIAGNOSIS — R609 Edema, unspecified: Secondary | ICD-10-CM

## 2012-09-30 DIAGNOSIS — I252 Old myocardial infarction: Secondary | ICD-10-CM

## 2012-09-30 DIAGNOSIS — I2510 Atherosclerotic heart disease of native coronary artery without angina pectoris: Secondary | ICD-10-CM

## 2012-09-30 DIAGNOSIS — I6529 Occlusion and stenosis of unspecified carotid artery: Secondary | ICD-10-CM

## 2012-09-30 DIAGNOSIS — I82409 Acute embolism and thrombosis of unspecified deep veins of unspecified lower extremity: Secondary | ICD-10-CM

## 2012-09-30 MED ORDER — FUROSEMIDE 20 MG PO TABS: 20.0000 mg | ORAL_TABLET | Freq: Every morning | ORAL | Status: DC

## 2012-09-30 NOTE — Patient Instructions (Addendum)
Your physician recommends that you return for lab work in: 10 days (on October 18)  Your physician has recommended you make the following change in your medication: start taking Furosemide 20 mg every morning  Your physician recommends that you start a Potassium rich diet  Your physician wants you to follow-up in: 6 months. You will receive a reminder letter in the mail two months in advance. If you don't receive a letter, please call our office to schedule the follow-up appointment.

## 2012-09-30 NOTE — Assessment & Plan Note (Signed)
Stable. Continue current medical therapy. 

## 2012-09-30 NOTE — Progress Notes (Signed)
HPI Zachary Harris returns today for evaluation and management of his coronary artery disease, history bypass surgery, previous myocardial infarction with ejection fraction of 40% as evaluated in 2012,, history of DVT, chronic anticoagulation therapy.  He lost his lovely wife in April. He still lives in [facility].He still goes in the office every day but is very emotional talking about her. He is very depressed. He's had a lot of lower extremity swelling particularly his right lower extremity. He denies any pleuritic chest pain or hemoptysis. He remains on anticoagulation.  He denies any angina, orthopnea, or PND. He is compliant with his medications.  Past Medical History  Diagnosis Date  . ANEMIA-IRON DEFICIENCY 05/27/2007  . ANXIETY 11/14/2007  . Intermittent high-grade heart block 09/05/2009  . BENIGN PROSTATIC HYPERTROPHY 11/14/2007  . CAROTID ARTERY DISEASE 10/02/2010  . Pacemaker 02/02/2008    [employer]  . COLONIC POLYPS, HX OF 05/27/2007  . CORONARY ARTERY DISEASE 11/14/2007    prior bypass surgery; ejection fraction 40-45% 2012  . DEPRESSION 11/14/2007  . DIABETES MELLITUS, TYPE II 11/14/2007  . DIVERTICULITIS, HX OF 05/27/2007  . DVT (deep venous thrombosis) 03/21/2011  . Edema 01/15/2011  . FOOT PAIN, BILATERAL 12/08/2010  . GERD 11/14/2007  . GROIN PAIN 09/24/2008  . HYPERLIPIDEMIA 11/14/2007  . HYPERTENSION 11/14/2007  . HYPOTENSION, ORTHOSTATIC 03/06/2011  . Long term current use of anticoagulant 03/21/2011  . Memory loss 06/01/2008  . OSTEOPOROSIS 06/02/2010  . PERIPHERAL VASCULAR DISEASE 11/14/2007  . RASH-NONVESICULAR 01/22/2008  . RENAL INSUFFICIENCY 11/14/2007  . SECONDARY DM W/PERIPHERAL CIRC D/O UNCONTROLLED 11/07/2010  . SYNCOPE 09/08/2008  . Unspecified hearing loss 05/13/2009  . VERTEBRAL FRACTURE 09/05/2009    Current Outpatient Prescriptions  Medication Sig Dispense Refill  . alendronate (FOSAMAX) 70 MG tablet Take 70 mg by mouth every 7 (seven) days. Take with a full  glass of water on an empty stomach.       Marland Kitchen atorvastatin (LIPITOR) 40 MG tablet Take 40 mg by mouth daily.        . fenofibrate 160 MG tablet Take 160 mg by mouth daily.        Marland Kitchen Fesoterodine Fumarate (TOVIAZ) 8 MG TB24 Take 8 mg by mouth daily.        . nitroGLYCERIN (NITROSTAT) 0.4 MG SL tablet Place 0.4 mg under the tongue every 5 (five) minutes as needed.        . pioglitazone (ACTOS) 15 MG tablet Take 15 mg by mouth daily.        Marland Kitchen warfarin (COUMADIN) 2.5 MG tablet Take as directed by Anticoagulation clinic  150 tablet  3  . furosemide (LASIX) 20 MG tablet Take 1 tablet (20 mg total) by mouth every morning.  90 tablet  2    No Known Allergies  Family History  Problem Relation Age of Onset  . Diabetes Father     64    History   Social History  . Marital Status: Married    Spouse Name: N/A    Number of Children: 2  . Years of Education: N/A   Occupational History  . retired operated Conservation officer, historic buildings    Social History Main Topics  . Smoking status: Former Smoker    Quit date: 10/18/1983  . Smokeless tobacco: Not on file  . Alcohol Use: No  . Drug Use: Not on file  . Sexually Active: Not on file   Other Topics Concern  . Not on file   Social History Narrative  .  No narrative on file    ROS ALL NEGATIVE EXCEPT THOSE NOTED IN HPI  PE  General Appearance: well developed, well nourished in no acute distress HEENT: symmetrical face, PERRLA, good dentition  Neck: no JVD, thyromegaly, or adenopathy, trachea midline Chest: symmetric without deformity Cardiac: PMI non-displaced, RRR, normal S1, S2, no gallop or murmur Lung: clear to ausculation and percussion Vascular: all pulses full without bruits  Abdominal: nondistended, nontender, good bowel sounds, no HSM, no bruits Extremities: no cyanosis, clubbing, 3+ pitting edema pretibially and at the ankle of the right leg, right foot and ankle are red but not warm or tender. There is no weeping. No evidence of  cellulitis. no sign of DVT, no varicosities, left lower extremity has 1-2+ pitting edema. Skin: normal color, no rashes Neuro: alert and oriented x 3, non-focal Pysch: normal affect  EKG Normal sinus rhythm with a first-degree block, no acute changes BMET    Component Value Date/Time   NA 136 03/20/2011 0519   K 4.1 03/20/2011 0519   CL 106 03/20/2011 0519   CO2 23 03/20/2011 0519   GLUCOSE 152* 03/20/2011 0519   GLUCOSE 121* 01/02/2007 0858   BUN 23 03/20/2011 0519   CREATININE 1.24 03/20/2011 0519   CALCIUM 8.7 03/20/2011 0519   CALCIUM 9.5 06/02/2010 2048   GFRNONAA 55* 03/20/2011 0519   GFRAA  Value: >60        The eGFR has been calculated using the MDRD equation. This calculation has not been validated in all clinical situations. eGFR's persistently <60 mL/min signify possible Chronic Kidney Disease. 03/20/2011 0519    Lipid Panel     Component Value Date/Time   CHOL 126 03/06/2011 1232   TRIG 78.0 03/06/2011 1232   HDL 31.70* 03/06/2011 1232   CHOLHDL 4 03/06/2011 1232   VLDL 15.6 03/06/2011 1232   LDLCALC 79 03/06/2011 1232    CBC    Component Value Date/Time   WBC 6.4 03/20/2011 0519   RBC 4.51 03/20/2011 0519   HGB 13.7 03/20/2011 0519   HCT 41.7 03/20/2011 0519   PLT 197 03/20/2011 0519   MCV 92.5 03/20/2011 0519   MCH 30.4 03/20/2011 0519   MCHC 32.9 03/20/2011 0519   RDW 13.6 03/20/2011 0519   LYMPHSABS 0.8 03/09/2011 1230   MONOABS 0.6 03/09/2011 1230   EOSABS 0.1 03/09/2011 1230   BASOSABS 0.0 03/09/2011 1230

## 2012-09-30 NOTE — Assessment & Plan Note (Signed)
Asymptomatic. Continue secondary preventative therapy. 

## 2012-09-30 NOTE — Assessment & Plan Note (Signed)
This is a lot worse but is a chronic issue. I do not think he has a DVT but he is already on Coumadin. Will begin Lasix 20 mg by mouth every morning and potassium rich diet. Return for metabolic profile in about a week to 10 days. Leg elevation salt restriction also emphasized.

## 2012-10-03 ENCOUNTER — Telehealth: Payer: Self-pay | Admitting: Cardiology

## 2012-10-03 NOTE — Telephone Encounter (Signed)
New Problem:    Patient called in because after taking furosemide (LASIX) 20 MG tablet his left foot has returned to normal but his right leg remains uncomfortable swollen.  Please call back.

## 2012-10-03 NOTE — Telephone Encounter (Signed)
Pt calls with edema of right leg and "my leg hurts". This is unchanged from last ov with Dr. Daleen Squibb on 09/30/12. He does not weigh but states his weight hasn't changed. No other symptoms noted.  Pt has BMP ordered for 10/10/12  I will forward to Dr. Daleen Squibb for review. Mylo Red RN

## 2012-10-08 ENCOUNTER — Other Ambulatory Visit: Payer: Self-pay | Admitting: *Deleted

## 2012-10-08 MED ORDER — WARFARIN SODIUM 2.5 MG PO TABS: ORAL_TABLET | ORAL | Status: DC

## 2012-10-10 ENCOUNTER — Ambulatory Visit (INDEPENDENT_AMBULATORY_CARE_PROVIDER_SITE_OTHER): Payer: Medicare Other | Admitting: *Deleted

## 2012-10-10 ENCOUNTER — Other Ambulatory Visit (INDEPENDENT_AMBULATORY_CARE_PROVIDER_SITE_OTHER): Payer: Medicare Other

## 2012-10-10 DIAGNOSIS — I82409 Acute embolism and thrombosis of unspecified deep veins of unspecified lower extremity: Secondary | ICD-10-CM

## 2012-10-10 DIAGNOSIS — Z7901 Long term (current) use of anticoagulants: Secondary | ICD-10-CM

## 2012-10-10 DIAGNOSIS — I10 Essential (primary) hypertension: Secondary | ICD-10-CM

## 2012-10-10 LAB — BASIC METABOLIC PANEL
Calcium: 8.8 mg/dL (ref 8.4–10.5)
Chloride: 98 mEq/L (ref 96–112)
Creatinine, Ser: 1.6 mg/dL — ABNORMAL HIGH (ref 0.4–1.5)

## 2012-10-10 LAB — POCT INR: INR: 2.2

## 2012-10-13 ENCOUNTER — Other Ambulatory Visit: Payer: Self-pay | Admitting: *Deleted

## 2012-10-15 ENCOUNTER — Ambulatory Visit (INDEPENDENT_AMBULATORY_CARE_PROVIDER_SITE_OTHER): Payer: Medicare Other | Admitting: Internal Medicine

## 2012-10-15 ENCOUNTER — Encounter: Payer: Self-pay | Admitting: Internal Medicine

## 2012-10-15 VITALS — BP 132/80 | HR 80 | Ht 69.0 in | Wt 180.4 lb

## 2012-10-15 DIAGNOSIS — R609 Edema, unspecified: Secondary | ICD-10-CM

## 2012-10-15 DIAGNOSIS — Z950 Presence of cardiac pacemaker: Secondary | ICD-10-CM

## 2012-10-15 DIAGNOSIS — I441 Atrioventricular block, second degree: Secondary | ICD-10-CM

## 2012-10-15 DIAGNOSIS — N259 Disorder resulting from impaired renal tubular function, unspecified: Secondary | ICD-10-CM

## 2012-10-15 LAB — PACEMAKER DEVICE OBSERVATION
AL AMPLITUDE: 4.5 mv
AL THRESHOLD: 0.5 V
DEVICE MODEL PM: 7228315
RV LEAD AMPLITUDE: 12 mv
RV LEAD IMPEDENCE PM: 590 Ohm
RV LEAD THRESHOLD: 0.75 V

## 2012-10-15 NOTE — Assessment & Plan Note (Signed)
Rhythm stable with pacing

## 2012-10-15 NOTE — Progress Notes (Signed)
Patient Care Team: Corwin Levins, MD as PCP - General   HPI  Zachary Harris is a 78 y.o. male  Seen in followup for pacemaker implanted for high-grade heart block and recurrent syncope. He also had orthostatic hypotension potentially contributing to his syncope, this potentially associated with diabetic autonomic neuropathy  His history of ischemic heart disease with prior bypass grafting and depressed left ventricular function with an EF of 40-45%. He also has hypertension  He was seen a few weeks ago by Dr. wall who noted significant worsening of his chronic peripheral edema. He was started on Lasix 20 mg daily. There was a small change in his BUN/creatinine from 23/1.2--33/1.6 and the dose was reduced to every other day  According to the weights in this office is down 12 pounds since being seen 10/8  Past Medical History  Diagnosis Date  . ANEMIA-IRON DEFICIENCY 05/27/2007  . ANXIETY 11/14/2007  . Intermittent high-grade heart block 09/05/2009  . BENIGN PROSTATIC HYPERTROPHY 11/14/2007  . CAROTID ARTERY DISEASE 10/02/2010  . Pacemaker 02/02/2008    St. Jude  . COLONIC POLYPS, HX OF 05/27/2007  . CORONARY ARTERY DISEASE 11/14/2007    prior bypass surgery; ejection fraction 40-45% 2012  . DEPRESSION 11/14/2007  . DIABETES MELLITUS, TYPE II 11/14/2007  . DIVERTICULITIS, HX OF 05/27/2007  . DVT (deep venous thrombosis) 03/21/2011  . Edema 01/15/2011  . FOOT PAIN, BILATERAL 12/08/2010  . GERD 11/14/2007  . GROIN PAIN 09/24/2008  . HYPERLIPIDEMIA 11/14/2007  . HYPERTENSION 11/14/2007  . HYPOTENSION, ORTHOSTATIC 03/06/2011  . Long term current use of anticoagulant 03/21/2011  . Memory loss 06/01/2008  . OSTEOPOROSIS 06/02/2010  . PERIPHERAL VASCULAR DISEASE 11/14/2007  . RASH-NONVESICULAR 01/22/2008  . RENAL INSUFFICIENCY 11/14/2007  . SECONDARY DM W/PERIPHERAL CIRC D/O UNCONTROLLED 11/07/2010  . SYNCOPE 09/08/2008  . Unspecified hearing loss 05/13/2009  . VERTEBRAL FRACTURE 09/05/2009     Past Surgical History  Procedure Date  . Coronary artery bypass graft   . S/p coronary stent x 1   . Cholecystectomy   . S/p bilat knee replacement 1992  . Pacemaker insertion 2012    Current Outpatient Prescriptions  Medication Sig Dispense Refill  . alendronate (FOSAMAX) 70 MG tablet Take 70 mg by mouth every 7 (seven) days. Take with a full glass of water on an empty stomach.       Marland Kitchen atorvastatin (LIPITOR) 40 MG tablet Take 40 mg by mouth daily.        . fenofibrate 160 MG tablet Take 160 mg by mouth daily.        Marland Kitchen Fesoterodine Fumarate (TOVIAZ) 8 MG TB24 Take 8 mg by mouth daily.        . furosemide (LASIX) 20 MG tablet 1 every other day      . nitroGLYCERIN (NITROSTAT) 0.4 MG SL tablet Place 0.4 mg under the tongue every 5 (five) minutes as needed.        . pioglitazone (ACTOS) 15 MG tablet Take 15 mg by mouth daily.        Marland Kitchen warfarin (COUMADIN) 2.5 MG tablet Take as directed by Anticoagulation clinic  50 tablet  3    No Known Allergies  Review of Systems negative except from HPI and PMH  Physical Exam BP 132/80  Pulse 80  Ht 5\' 9"  (1.753 m)  Wt 180 lb 6.4 oz (81.829 kg)  BMI 26.64 kg/m2 Well developed and well nourished in no acute distress HENT normal E scleral and icterus clear Neck  Supple JVP flat; carotids brisk and full Clear to ausculation; marked kyphosis Regular rate and rhythm, no murmurs gallops or rub Soft with active bowel sounds No clubbing cyanosis 3+ Edema Alert and oriented, grossly normal motor and sensory function Skin Warm and Dry    Assessment and  Plan

## 2012-10-15 NOTE — Assessment & Plan Note (Signed)
BUN and creatinine were elevated a little bit with the diuretics. Repeat blood work should be checked the next couple of weeks. I will defer this to Dr. Chilton Si

## 2012-10-15 NOTE — Patient Instructions (Addendum)
Remote monitoring is used to monitor your Pacemaker of ICD from home. This monitoring reduces the number of office visits required to check your device to one time per year. It allows us to keep an eye on the functioning of your device to ensure it is working properly. You are scheduled for a device check from home on January 19, 2013. You may send your transmission at any time that day. If you have a wireless device, the transmission will be sent automatically. After your physician reviews your transmission, you will receive a postcard with your next transmission date.  Your physician wants you to follow-up in: 1 year with Dr. Klein. You will receive a reminder letter in the mail two months in advance. If you don't receive a letter, please call our office to schedule the follow-up appointment.  

## 2012-10-15 NOTE — Assessment & Plan Note (Signed)
Severe edema is markedly improved on low-dose diuretics with only mild exacerbation of his BUN and creatinine. His neck veins are fairly flat and I wonder to what degree he is hypoalbuminemic or has venous insufficiency. He is to see his PCP later this week and I will defer the evaluation of that to him. I also mentioned the possibility of compression stockings.

## 2012-10-20 NOTE — Telephone Encounter (Signed)
Set up for venous doppler of right leg in office. Increase Lasix to 40mg  q day with Kcl supplementation.

## 2012-10-22 NOTE — Telephone Encounter (Signed)
I spoke with pt and he is doing quite well. States he has lost 15 pounds and his feet and legs are not swollen.  They do not hurt anymore either. No tests ordered at this time. Mylo Red RN

## 2012-10-31 ENCOUNTER — Ambulatory Visit (INDEPENDENT_AMBULATORY_CARE_PROVIDER_SITE_OTHER): Payer: Medicare Other | Admitting: *Deleted

## 2012-10-31 DIAGNOSIS — Z7901 Long term (current) use of anticoagulants: Secondary | ICD-10-CM

## 2012-10-31 DIAGNOSIS — I82409 Acute embolism and thrombosis of unspecified deep veins of unspecified lower extremity: Secondary | ICD-10-CM

## 2012-12-04 ENCOUNTER — Ambulatory Visit (INDEPENDENT_AMBULATORY_CARE_PROVIDER_SITE_OTHER): Payer: Medicare Other | Admitting: Pharmacist

## 2012-12-04 DIAGNOSIS — I82409 Acute embolism and thrombosis of unspecified deep veins of unspecified lower extremity: Secondary | ICD-10-CM

## 2012-12-04 DIAGNOSIS — Z7901 Long term (current) use of anticoagulants: Secondary | ICD-10-CM

## 2013-01-15 ENCOUNTER — Ambulatory Visit (INDEPENDENT_AMBULATORY_CARE_PROVIDER_SITE_OTHER): Payer: Medicare Other | Admitting: Pharmacist

## 2013-01-15 DIAGNOSIS — I82409 Acute embolism and thrombosis of unspecified deep veins of unspecified lower extremity: Secondary | ICD-10-CM

## 2013-01-15 DIAGNOSIS — Z7901 Long term (current) use of anticoagulants: Secondary | ICD-10-CM

## 2013-01-19 ENCOUNTER — Ambulatory Visit (INDEPENDENT_AMBULATORY_CARE_PROVIDER_SITE_OTHER): Payer: Medicare Other | Admitting: *Deleted

## 2013-01-19 DIAGNOSIS — Z950 Presence of cardiac pacemaker: Secondary | ICD-10-CM

## 2013-01-19 DIAGNOSIS — I441 Atrioventricular block, second degree: Secondary | ICD-10-CM

## 2013-01-19 LAB — REMOTE PACEMAKER DEVICE
ATRIAL PACING PM: 1
BAMS-0003: 70 {beats}/min
RV LEAD IMPEDENCE PM: 590 Ohm
VENTRICULAR PACING PM: 3

## 2013-01-22 ENCOUNTER — Other Ambulatory Visit: Payer: Self-pay | Admitting: *Deleted

## 2013-01-22 MED ORDER — NITROGLYCERIN 0.4 MG SL SUBL: 0.4000 mg | SUBLINGUAL_TABLET | SUBLINGUAL | Status: DC | PRN

## 2013-01-27 ENCOUNTER — Encounter: Payer: Self-pay | Admitting: *Deleted

## 2013-01-30 ENCOUNTER — Other Ambulatory Visit: Payer: Self-pay | Admitting: Cardiology

## 2013-01-30 ENCOUNTER — Encounter: Payer: Self-pay | Admitting: Internal Medicine

## 2013-02-11 ENCOUNTER — Other Ambulatory Visit: Payer: Self-pay | Admitting: Cardiology

## 2013-02-11 ENCOUNTER — Other Ambulatory Visit: Payer: Self-pay | Admitting: Urology

## 2013-02-11 DIAGNOSIS — C61 Malignant neoplasm of prostate: Secondary | ICD-10-CM

## 2013-02-18 ENCOUNTER — Ambulatory Visit: Payer: Medicare Other

## 2013-02-18 ENCOUNTER — Ambulatory Visit: Payer: Medicare Other | Admitting: Radiation Oncology

## 2013-02-23 ENCOUNTER — Encounter (HOSPITAL_COMMUNITY)
Admission: RE | Admit: 2013-02-23 | Discharge: 2013-02-23 | Disposition: A | Discharge status: Home / Self Care | Payer: Medicare Other | Source: Ambulatory Visit | Attending: Urology | Admitting: Urology

## 2013-02-23 DIAGNOSIS — M4120 Other idiopathic scoliosis, site unspecified: Secondary | ICD-10-CM | POA: Insufficient documentation

## 2013-02-23 DIAGNOSIS — C61 Malignant neoplasm of prostate: Secondary | ICD-10-CM | POA: Insufficient documentation

## 2013-02-23 DIAGNOSIS — Z96659 Presence of unspecified artificial knee joint: Secondary | ICD-10-CM | POA: Insufficient documentation

## 2013-02-23 MED ORDER — TECHNETIUM TC 99M MEDRONATE IV KIT
25.0000 | PACK | Freq: Once | INTRAVENOUS | Status: AC | PRN
  Administered 2013-02-23: 25 via INTRAVENOUS

## 2013-02-25 ENCOUNTER — Encounter: Payer: Self-pay | Admitting: Radiation Oncology

## 2013-02-25 ENCOUNTER — Ambulatory Visit
Admission: RE | Admit: 2013-02-25 | Discharge: 2013-02-25 | Disposition: A | Discharge status: Home / Self Care | Payer: Medicare Other | Source: Ambulatory Visit | Attending: Radiation Oncology | Admitting: Radiation Oncology

## 2013-02-25 VITALS — BP 182/84 | HR 64 | Temp 98.0°F | Resp 16 | Ht 69.0 in | Wt 183.1 lb

## 2013-02-25 DIAGNOSIS — C61 Malignant neoplasm of prostate: Secondary | ICD-10-CM

## 2013-02-25 NOTE — Progress Notes (Signed)
Radiation Oncology         [phone] ________________________________  Initial outpatient Consultation  Name: KAHLI FITZGERALD MRN: [id number]  Date: 02/25/2013  DOB: Jul 01, 1923  [id number], Lenon Curt, MD  Anner Crete, MD   REFERRING PHYSICIAN: Anner Crete, MD  DIAGNOSIS: The encounter diagnosis was Malignant neoplasm of prostate.  HISTORY OF PRESENT ILLNESS::Alfonso D Maceachern is a 79 y.o. male who is seen out of the courtesy of Dr. Bjorn Pippin for an opinion concerning radiation therapy as part of management of patient's adenocarcinoma of the prostate.  Mr. Gallien was diagnosed with prostate cancer in June of 2007. Biopsies showed involvement of the left base left mid prostate area and left apex. His disease was Gleason 6 as well as Gleason 7 (3+4).  The patient's presenting PSA at that time was 1.57. The patient elected to proceed with watchful waiting in light of the above tumor characteristics and his age. Patient was on Avodart at one point but has stopped this medication. Earlier this year the patient's PSA had risen to 10. Patient continued to have a palpable nodule within the left apex measuring approximately 2 cm. Given the significant PSA elevation the options for management were discussed with the patient by his urologist. Options include continued watchful waiting and radiation therapy. The patient is quite interested in  proceeding with curative treatment is now seen in radiation oncology for further evaluation.   PREVIOUS RADIATION THERAPY: No  PAST MEDICAL HISTORY:  has a past medical history of ANEMIA-IRON DEFICIENCY (05/27/2007); ANXIETY (11/14/2007); Intermittent high-grade heart block (09/05/2009); BENIGN PROSTATIC HYPERTROPHY (11/14/2007); CAROTID ARTERY DISEASE (10/02/2010); Pacemaker-[employer]. Jude (02/02/2008); COLONIC POLYPS, HX OF (05/27/2007); CORONARY ARTERY DISEASE (11/14/2007); DEPRESSION (11/14/2007); DIABETES MELLITUS, TYPE II (11/14/2007); DIVERTICULITIS, HX OF (05/27/2007); DVT  (deep venous thrombosis) (03/21/2011); Edema (01/15/2011); FOOT PAIN, BILATERAL (12/08/2010); GERD (11/14/2007); GROIN PAIN (09/24/2008); HYPERLIPIDEMIA (11/14/2007); HYPERTENSION (11/14/2007); HYPOTENSION, ORTHOSTATIC (03/06/2011); Long term current use of anticoagulant (03/21/2011); Memory loss (06/01/2008); OSTEOPOROSIS (06/02/2010); PERIPHERAL VASCULAR DISEASE (11/14/2007); RASH-NONVESICULAR (01/22/2008); RENAL INSUFFICIENCY (11/14/2007); SECONDARY DM W/PERIPHERAL CIRC D/O UNCONTROLLED (11/07/2010); SYNCOPE (09/08/2008); Unspecified hearing loss (05/13/2009); VERTEBRAL FRACTURE (09/05/2009); Prostate cancer; Acute cystitis; and Myocardial infarction.    PAST SURGICAL HISTORY: Past Surgical History  Procedure Laterality Date  . Coronary artery bypass graft    . S/p coronary stent x 1    . Cholecystectomy    . S/p bilat knee replacement  1992  . Pacemaker insertion  2012  . Prostate needle biopsy    . Cystoscopy      FAMILY HISTORY: family history includes Diabetes in his father.  SOCIAL HISTORY:  reports that he quit smoking about 29 years ago. He has never used smokeless tobacco. He reports that he does not drink alcohol or use illicit drugs.  ALLERGIES: Review of patient's allergies indicates no known allergies.  MEDICATIONS:  Current Outpatient Prescriptions  Medication Sig Dispense Refill  . alendronate (FOSAMAX) 70 MG tablet Take 70 mg by mouth every 7 (seven) days. Take with a full glass of water on an empty stomach.       Marland Kitchen atorvastatin (LIPITOR) 40 MG tablet Take 40 mg by mouth daily.        . fenofibrate 160 MG tablet Take 160 mg by mouth daily.        . furosemide (LASIX) 20 MG tablet 1 every other day      . NITROSTAT 0.4 MG SL tablet DISSOLVE ONE TABLET UNDER TONGUE AS NEEDED FOR CHEST PAIN.  25 tablet  5  . warfarin (COUMADIN) 2.5 MG tablet TAKE AS DIRECTED BY COUMADIN CLINIC.  50 tablet  3  . tolterodine (DETROL) 1 MG tablet Take 1 mg by mouth 2 (two) times daily.       No current  facility-administered medications for this encounter.    REVIEW OF SYSTEMS:  A 15 point review of systems is documented in the electronic medical record. This was obtained by the nursing staff. However, I reviewed this with the patient to discuss relevant findings and make appropriate changes.   The patient completed the international prostate symptom score with total score of 15 presenting moderate symptomatology. No significant scores were in incomplete emptying and urgency. Patient denies any new bony pain. He does have the stiffness and pain in his hands upon awakening. Patient does have some symptoms of bladder outlet obstruction.   PHYSICAL EXAM:  height is 5\' 9"  (1.753 m) and weight is 183 lb 1.6 oz (83.054 kg). His oral temperature is 98 F (36.7 C). His blood pressure is 182/84 and his pulse is 64. His respiration is 16 and oxygen saturation is 99%.   BP 182/84  Pulse 64  Temp(Src) 98 F (36.7 C) (Oral)  Resp 16  Ht 5\' 9"  (1.753 m)  Wt 183 lb 1.6 oz (83.054 kg)  BMI 27.03 kg/m2  SpO2 99% he is accompanied by his son on evaluation today who has healthcare power of attorney  General Appearance:    Alert, cooperative, no distress, appears stated age  Head:    Normocephalic, without obvious abnormality, atraumatic  Eyes:    PERRL, conjunctiva/corneas clear, EOM's intact,         Ears:    bilateral hearing aids in place   Nose:   Nares normal, septum midline, mucosa normal, no drainage    or sinus tenderness  Throat:   Lips, mucosa, and tongue normal; teeth and gums normal, partial in place   Neck:   Supple, symmetrical, trachea midline, no adenopathy;       thyroid:  No enlargement/tenderness/nodules; no carotid   bruit or JVD  Back:     Symmetric, no curvature, ROM normal, no CVA tenderness  Lungs:     Clear to auscultation bilaterally, respirations unlabored  Chest wall:    No tenderness or deformity, scar from prior CABG, pacemaker in place in the left upper chest.   Heart:     Regular rate and rhythm,    Abdomen:     Soft, non-tender, bowel sounds active all four quadrants,    no masses, no organomegaly  Genitalia:    Normal male without lesion, discharge or tenderness, circumcised , the testicles are soft without masses.   Rectal:     Tone decreased but appears adequate,  Prostate approximately 2 cm nodule palpable in the left apex area. The gland is 2+ in size. I am unable to reach the seminal vesicles on evaluation., no masses or tenderness;    Extremities:   Extremities normal, atraumatic, no cyanosis edema and noted in the ankle and foot areas bilaterally.   Pulses:   2+ and symmetric all extremities  Skin:   Skin color, texture, turgor normal, no rashes or lesions  Lymph nodes:   Cervical, supraclavicular, and axillary nodes normal  Neurologic:   . Normal strength, sensation and reflexes      throughout    LABORATORY DATA:  Lab Results  Component Value Date   WBC 6.4 03/20/2011   HGB 13.7 03/20/2011   HCT  41.7 03/20/2011   MCV 92.5 03/20/2011   PLT 197 03/20/2011   Lab Results  Component Value Date   NA 137 10/10/2012   K 3.7 10/10/2012   CL 98 10/10/2012   CO2 30 10/10/2012   Lab Results  Component Value Date   ALT 14 03/15/2011   AST 19 03/15/2011   ALKPHOS 47 03/15/2011   BILITOT 0.8 03/15/2011     RADIOGRAPHY: Nm Bone Scan Whole Body  02/23/2013  *RADIOLOGY REPORT*  Clinical Data: 79 year old male with prostate cancer.  Bilateral knee replacement surgery.  NUCLEAR MEDICINE WHOLE BODY BONE SCINTIGRAPHY  Technique:  Whole body anterior and posterior images were obtained approximately 3 hours after intravenous injection of radiopharmaceutical.  Radiopharmaceutical: CURIE TC-MDP TECHNETIUM TC 38M MEDRONATE IV KIT  Comparison: Chest radiographs 03/20/2011 and earlier.  Findings: Expected radiotracer activity in both kidneys and bladder.  Injection site activity at the left antecubital fossa.  Subtle increased activity at the right L5 level appears  to be degenerative. There is mild scoliosis.  Otherwise homogeneous radiotracer activity in the spine, skull, and pelvis.  Mildly heterogeneous activity at both sternoclavicular joints and the left proximal to mid clavicle. No clavicle deformity on the comparison.  Left chest pacemaker seen on that study is not evident today.  There are photopenic defects at both knees compatible with previous total knee arthroplasties.  Physiologic activity in the lower extremities otherwise.  Degenerative activity at the right thumb metacarpal joint.  IMPRESSION: 1.  Mildly heterogeneous and asymmetric activity about the left clavicle and both sternoclavicular joints.  Favor degenerative with attention directed on follow-up. 2.  Otherwise negative.   Original Report Authenticated By: Erskine Speed, M.D.       IMPRESSION: Gleason's 7 adenocarcinoma of the prostate. It would appear that the patient's disease is becoming more aggressive with significant PSA elevation over the past several months. I discussed options for management with the patient and his son. We discussed watchful waiting particularly in light of patient's advanced age. We also discussed the curative treatment with radiation therapy the directed at the prostate gland to a dose of approximately 78 Gy. The patient does feel that he will live to his upper 90s given his family history and he wishes to be aggressive with his treatment.  He  does wish to think his options over but is leaning toward proceeding with radiation therapy rather than watchful waiting. The patient's son is agreeable to whatever Mr. Lindwood Coke decides.  PLAN: I have asked the patient to call me once he has made his final decision concerning management. If he decides to proceed with radiation therapy then he will be referred back to Dr. Annabell Howells for placement of gold fiducial markers for image guided,  intensity modulated radiation therapy.  I spent 60 minutes minutes face to face with the patient and  his family and more than 50% of that time was spent in counseling and/or coordination of care.   ------------------------------------------------ -----------------------------------  Billie Lade, PhD, MD

## 2013-02-25 NOTE — Progress Notes (Addendum)
Patient presents to the clinic today accompanied by his son for consultation with Dr. Roselind Messier to discuss the role of radiation therapy in the treatment of prostate cancer. Patient is alert and oriented to person, place, and time. No distress noted. Steady gait noted. Pleasant affect noted. Complete PATIENT MEASURE OF DISTRESS worksheet submitted to social work with a score of 0. Patient denies pain at this time. Patient reports urgency but, does report he is able to go 3-4 hours during the day before he has to void again. Patient denies incontinence. Patient states, that he has been able to adapt to his illness and takes bathroom breaks before long car rides or meetings with client. Patient reports on average he gets up only twice during the night to void. Patient denies burning with urination. Patient reports a normal strong urine stream. Patient reports the majority of the time he sits to void because he feels like he can empty his bladder better. Patient reports diarrhea related to dye injected for scan yesterday. Patient denies nausea, vomiting, headache or dizziness. Patient denies night sweats. Patient denies unintentional weight loss. Patient concerned that his hands are often very cold. Reported all findings to Dr. Roselind Messier.

## 2013-02-25 NOTE — Progress Notes (Signed)
See progress note under physician encounter. 

## 2013-02-25 NOTE — Progress Notes (Signed)
79 year old male. Widowed. 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 Lives in an apartment.  Referred by Dr. Bjorn Pippin reference consideration of radiation therapy for the treatment of adenocarcinoma of the prostate. Bone scan done 02/23/13 negative. 06/13/2006 prostate biopsy revealed gleason 7 T2a prostate ca. PSA has been suppressed with avodart to 1.57 after diagnosis but, has risen since stopping that med. Prostate volume per ultrasound done 06/16/08 was 50 cc.  02/04/13 PSA 10.16 08/18/12 PSA 6.01 05/07/12 PSA 5.74  [id number] Has a pacemaker No hx of radiation therapy

## 2013-02-25 NOTE — Addendum Note (Signed)
Encounter addended by: Billie Lade, MD on: 02/25/2013  7:45 PM<BR>     Documentation filed: Visit Diagnoses, Clinical Notes, Notes Section

## 2013-03-02 ENCOUNTER — Telehealth: Payer: Self-pay | Admitting: Radiation Oncology

## 2013-03-02 ENCOUNTER — Ambulatory Visit (INDEPENDENT_AMBULATORY_CARE_PROVIDER_SITE_OTHER): Payer: Medicare Other | Admitting: *Deleted

## 2013-03-02 DIAGNOSIS — Z7901 Long term (current) use of anticoagulants: Secondary | ICD-10-CM

## 2013-03-02 DIAGNOSIS — I82409 Acute embolism and thrombosis of unspecified deep veins of unspecified lower extremity: Secondary | ICD-10-CM

## 2013-03-02 LAB — POCT INR: INR: 2.4

## 2013-03-02 NOTE — Telephone Encounter (Signed)
Patient phoned today requesting this writer inform Dr. Roselind Messier that he is not interested in radiation therapy but, is interest "in that thing that will delay progression." Told patient this writer would call him back this afternoon with further direction after informing Dr. Roselind Messier. Patient verbalized understanding. Routed this message to Dr. Roselind Messier.

## 2013-03-03 ENCOUNTER — Encounter: Payer: Self-pay | Admitting: Radiation Oncology

## 2013-03-03 NOTE — Progress Notes (Signed)
   Department of Radiation Oncology  Phone:  [phone] Fax:        [phone]  Progress note  Patient called back today and decided not to pursue radiation therapy for management of his prostate cancer. He is interested in hearing more about androgen ablation and I recommended he discuss this with Dr. Annabell Howells and to continue close followup with Dr. Annabell Howells for  management of his prostate cancer.  -----------------------------------  Billie Lade, PhD, MD

## 2013-04-14 ENCOUNTER — Ambulatory Visit (INDEPENDENT_AMBULATORY_CARE_PROVIDER_SITE_OTHER): Payer: Medicare Other | Admitting: *Deleted

## 2013-04-14 DIAGNOSIS — Z7901 Long term (current) use of anticoagulants: Secondary | ICD-10-CM

## 2013-04-14 DIAGNOSIS — I82409 Acute embolism and thrombosis of unspecified deep veins of unspecified lower extremity: Secondary | ICD-10-CM

## 2013-04-14 LAB — POCT INR: INR: 1.6

## 2013-04-20 ENCOUNTER — Other Ambulatory Visit: Payer: Self-pay

## 2013-04-20 ENCOUNTER — Ambulatory Visit (INDEPENDENT_AMBULATORY_CARE_PROVIDER_SITE_OTHER): Payer: Medicare Other | Admitting: *Deleted

## 2013-04-20 ENCOUNTER — Encounter: Payer: Self-pay | Admitting: Internal Medicine

## 2013-04-20 DIAGNOSIS — Z950 Presence of cardiac pacemaker: Secondary | ICD-10-CM

## 2013-04-20 DIAGNOSIS — I441 Atrioventricular block, second degree: Secondary | ICD-10-CM

## 2013-04-20 LAB — REMOTE PACEMAKER DEVICE
AL AMPLITUDE: 3.1 mv
BAMS-0003: 70 {beats}/min
BATTERY VOLTAGE: 2.96 V
RV LEAD AMPLITUDE: 12 mv
RV LEAD IMPEDENCE PM: 560 Ohm
VENTRICULAR PACING PM: 4.7

## 2013-04-28 ENCOUNTER — Ambulatory Visit (INDEPENDENT_AMBULATORY_CARE_PROVIDER_SITE_OTHER): Payer: Medicare Other | Admitting: Pharmacist

## 2013-04-28 ENCOUNTER — Encounter: Payer: Self-pay | Admitting: Internal Medicine

## 2013-04-28 ENCOUNTER — Non-Acute Institutional Stay: Payer: Medicare Other | Admitting: Internal Medicine

## 2013-04-28 VITALS — BP 122/60 | HR 76 | Ht 69.5 in | Wt 181.0 lb

## 2013-04-28 DIAGNOSIS — R413 Other amnesia: Secondary | ICD-10-CM

## 2013-04-28 DIAGNOSIS — I10 Essential (primary) hypertension: Secondary | ICD-10-CM

## 2013-04-28 DIAGNOSIS — Z7901 Long term (current) use of anticoagulants: Secondary | ICD-10-CM

## 2013-04-28 DIAGNOSIS — Z950 Presence of cardiac pacemaker: Secondary | ICD-10-CM

## 2013-04-28 DIAGNOSIS — I2510 Atherosclerotic heart disease of native coronary artery without angina pectoris: Secondary | ICD-10-CM

## 2013-04-28 DIAGNOSIS — I82409 Acute embolism and thrombosis of unspecified deep veins of unspecified lower extremity: Secondary | ICD-10-CM

## 2013-04-28 DIAGNOSIS — E1149 Type 2 diabetes mellitus with other diabetic neurological complication: Secondary | ICD-10-CM

## 2013-04-28 DIAGNOSIS — R609 Edema, unspecified: Secondary | ICD-10-CM

## 2013-04-28 DIAGNOSIS — E1142 Type 2 diabetes mellitus with diabetic polyneuropathy: Secondary | ICD-10-CM | POA: Insufficient documentation

## 2013-04-28 DIAGNOSIS — E785 Hyperlipidemia, unspecified: Secondary | ICD-10-CM

## 2013-04-28 LAB — POCT INR: INR: 1.3

## 2013-04-28 NOTE — Progress Notes (Signed)
Subjective:    Patient ID: Zachary Harris, male    DOB: Aug 12, 1923, 79 y.o.   MRN: [id number]  HPI Comments: Patient is feeling reasonably well. Some of the problems encountered on his last visit with foot and leg discomfort have improved.  He continues to see Dr. wall for his cardiology problems. He receives anticoagulation device through the [facility].  Peripheral edema is unchanged.  He continues to have significant problems with urinary frequency. He dates the onset of this to June 2007 when a diagnosis of prostate cancer was made.  Patient was to have stopped Actos following his last visit. This is because of coexisting problems of edema and heart disease. He is not clear as to whether he stopped this medication. He believes he may still be taking it.   Review of Systems  Constitutional: Positive for unexpected weight change. Negative for fever, chills, activity change, appetite change and fatigue.  HENT: Positive for hearing loss. Negative for ear pain and sinus pressure.   Eyes: Negative for photophobia, pain and visual disturbance.       Corrective lenses  Respiratory: Positive for shortness of breath. Negative for cough, chest tightness and wheezing.   Cardiovascular: Positive for leg swelling.       Pacemaker. History of syncope related to cardiac arrhythmia.  Gastrointestinal: Negative for abdominal pain and abdominal distention.  Endocrine: Negative for cold intolerance, heat intolerance, polydipsia, polyphagia and polyuria.       History of diabetes.  Genitourinary: Positive for urgency and frequency. Negative for dysuria and flank pain.       Urge incontinence. Increased frequency which has not responded well to Toviaz. Continues to see urologist.  Musculoskeletal: Positive for back pain, arthralgias and gait problem.       Poor balance and gait disturbance.  Skin:       Rough area on left cheek  Allergic/Immunologic: Negative.   Neurological:  Negative for tremors, seizures, facial asymmetry and weakness.  Hematological: Negative.   Psychiatric/Behavioral: Positive for confusion.       Mild to moderate memory disturbance.       Objective:   Physical Exam  Constitutional: He is oriented to person, place, and time.  Frail  HENT:  Head: Normocephalic and atraumatic.  Nose: Nose normal.  Bilateral hearing aids.  Eyes: Conjunctivae are normal. Pupils are equal, round, and reactive to light.  Prescription lenses.  Neck: Normal range of motion. Neck supple. No JVD present. No tracheal deviation present. No thyromegaly present.  Cardiovascular: Normal rate and regular rhythm.  Exam reveals no gallop and no friction rub.   Murmur heard.  Systolic murmur is present with a grade of 1/6  Pulmonary/Chest: Effort normal and breath sounds normal. No respiratory distress. He has no wheezes. He has no rales. He exhibits no tenderness.  Abdominal: Soft. Bowel sounds are normal. He exhibits no distension and no mass. There is no tenderness.  Musculoskeletal: Normal range of motion. He exhibits edema. He exhibits no tenderness.  Lymphadenopathy:    He has no cervical adenopathy.  Neurological: He is alert and oriented to person, place, and time. He has normal strength. A sensory deficit is present. No cranial nerve deficit. Gait abnormal.  Skin: Skin is warm and dry. No erythema. No pallor.  Rough area on the left cheek  Psychiatric: He has a normal mood and affect. His behavior is normal. Thought content normal.  Patient is in denial regarding the extent of his memory deficit.  Laboratory reports 04/23/2013 CMP: Glucose 172, BUN 30, creatinine 1.24, otherwise normal  Lipids: TC 125, trig 220, HDL 31, LDL 50  A1c 5.9       Assessment & Plan:     ICD-9-CM  1. HYPERTENSION Under good control  401.9  2. CORONARY ARTERY DISEASE Stable  414.00  3. Edema Unchanged  782.3  4. Pacemaker-St.Jude Functioning well  V45.01  5. Memory  loss Slowly progressive  780.93  6. Long term current use of anticoagulant Monitored [facility]  V58.61  7. HYPERLIPIDEMIA Needs recheck prior to next visit  272.4  8. Type II or unspecified type diabetes mellitus with neurological manifestations, uncontrolled(250.62) Stable and under good control  250.62

## 2013-05-01 ENCOUNTER — Other Ambulatory Visit: Payer: Self-pay

## 2013-05-01 ENCOUNTER — Emergency Department (HOSPITAL_COMMUNITY): Payer: Medicare Other

## 2013-05-01 ENCOUNTER — Telehealth: Payer: Self-pay | Admitting: *Deleted

## 2013-05-01 ENCOUNTER — Encounter (HOSPITAL_COMMUNITY): Payer: Self-pay | Admitting: Emergency Medicine

## 2013-05-01 ENCOUNTER — Emergency Department (HOSPITAL_COMMUNITY)
Admission: EM | Admit: 2013-05-01 | Discharge: 2013-05-01 | Disposition: A | Discharge status: Home Health | Payer: Medicare Other | Attending: Emergency Medicine | Admitting: Emergency Medicine

## 2013-05-01 DIAGNOSIS — I951 Orthostatic hypotension: Secondary | ICD-10-CM | POA: Insufficient documentation

## 2013-05-01 DIAGNOSIS — Z8739 Personal history of other diseases of the musculoskeletal system and connective tissue: Secondary | ICD-10-CM | POA: Insufficient documentation

## 2013-05-01 DIAGNOSIS — Z8659 Personal history of other mental and behavioral disorders: Secondary | ICD-10-CM | POA: Insufficient documentation

## 2013-05-01 DIAGNOSIS — R52 Pain, unspecified: Secondary | ICD-10-CM | POA: Insufficient documentation

## 2013-05-01 DIAGNOSIS — K219 Gastro-esophageal reflux disease without esophagitis: Secondary | ICD-10-CM | POA: Insufficient documentation

## 2013-05-01 DIAGNOSIS — N289 Disorder of kidney and ureter, unspecified: Secondary | ICD-10-CM

## 2013-05-01 DIAGNOSIS — Z87448 Personal history of other diseases of urinary system: Secondary | ICD-10-CM | POA: Insufficient documentation

## 2013-05-01 DIAGNOSIS — Z860100 Personal history of colon polyps, unspecified: Secondary | ICD-10-CM | POA: Insufficient documentation

## 2013-05-01 DIAGNOSIS — Z950 Presence of cardiac pacemaker: Secondary | ICD-10-CM | POA: Insufficient documentation

## 2013-05-01 DIAGNOSIS — Z86718 Personal history of other venous thrombosis and embolism: Secondary | ICD-10-CM | POA: Insufficient documentation

## 2013-05-01 DIAGNOSIS — Z8719 Personal history of other diseases of the digestive system: Secondary | ICD-10-CM | POA: Insufficient documentation

## 2013-05-01 DIAGNOSIS — E119 Type 2 diabetes mellitus without complications: Secondary | ICD-10-CM | POA: Insufficient documentation

## 2013-05-01 DIAGNOSIS — Z79899 Other long term (current) drug therapy: Secondary | ICD-10-CM | POA: Insufficient documentation

## 2013-05-01 DIAGNOSIS — E1149 Type 2 diabetes mellitus with other diabetic neurological complication: Secondary | ICD-10-CM | POA: Insufficient documentation

## 2013-05-01 DIAGNOSIS — R300 Dysuria: Secondary | ICD-10-CM | POA: Insufficient documentation

## 2013-05-01 DIAGNOSIS — Z951 Presence of aortocoronary bypass graft: Secondary | ICD-10-CM | POA: Insufficient documentation

## 2013-05-01 DIAGNOSIS — Z8669 Personal history of other diseases of the nervous system and sense organs: Secondary | ICD-10-CM | POA: Insufficient documentation

## 2013-05-01 DIAGNOSIS — I10 Essential (primary) hypertension: Secondary | ICD-10-CM | POA: Insufficient documentation

## 2013-05-01 DIAGNOSIS — Z8546 Personal history of malignant neoplasm of prostate: Secondary | ICD-10-CM | POA: Insufficient documentation

## 2013-05-01 DIAGNOSIS — E785 Hyperlipidemia, unspecified: Secondary | ICD-10-CM | POA: Insufficient documentation

## 2013-05-01 DIAGNOSIS — G8929 Other chronic pain: Secondary | ICD-10-CM | POA: Insufficient documentation

## 2013-05-01 DIAGNOSIS — R05 Cough: Secondary | ICD-10-CM | POA: Insufficient documentation

## 2013-05-01 DIAGNOSIS — Z862 Personal history of diseases of the blood and blood-forming organs and certain disorders involving the immune mechanism: Secondary | ICD-10-CM | POA: Insufficient documentation

## 2013-05-01 DIAGNOSIS — R197 Diarrhea, unspecified: Secondary | ICD-10-CM | POA: Insufficient documentation

## 2013-05-01 DIAGNOSIS — I2510 Atherosclerotic heart disease of native coronary artery without angina pectoris: Secondary | ICD-10-CM | POA: Insufficient documentation

## 2013-05-01 DIAGNOSIS — I252 Old myocardial infarction: Secondary | ICD-10-CM | POA: Insufficient documentation

## 2013-05-01 DIAGNOSIS — Z7901 Long term (current) use of anticoagulants: Secondary | ICD-10-CM | POA: Insufficient documentation

## 2013-05-01 DIAGNOSIS — N390 Urinary tract infection, site not specified: Secondary | ICD-10-CM

## 2013-05-01 DIAGNOSIS — R059 Cough, unspecified: Secondary | ICD-10-CM | POA: Insufficient documentation

## 2013-05-01 DIAGNOSIS — Z8679 Personal history of other diseases of the circulatory system: Secondary | ICD-10-CM | POA: Insufficient documentation

## 2013-05-01 LAB — CBC WITH DIFFERENTIAL/PLATELET
Basophils Absolute: 0 10*3/uL (ref 0.0–0.1)
Basophils Relative: 0 % (ref 0–1)
Eosinophils Absolute: 0 10*3/uL (ref 0.0–0.7)
Eosinophils Relative: 0 % (ref 0–5)
HCT: 37.2 % — ABNORMAL LOW (ref 39.0–52.0)
Hemoglobin: 12.5 g/dL — ABNORMAL LOW (ref 13.0–17.0)
Lymphocytes Relative: 5 % — ABNORMAL LOW (ref 12–46)
Lymphs Abs: 0.9 K/uL (ref 0.7–4.0)
MCH: 30.6 pg (ref 26.0–34.0)
MCHC: 33.6 g/dL (ref 30.0–36.0)
MCV: 91 fL (ref 78.0–100.0)
Monocytes Absolute: 1.6 10*3/uL — ABNORMAL HIGH (ref 0.1–1.0)
Monocytes Relative: 9 % (ref 3–12)
Neutro Abs: 15.6 10*3/uL — ABNORMAL HIGH (ref 1.7–7.7)
Neutrophils Relative %: 86 % — ABNORMAL HIGH (ref 43–77)
Platelets: 134 K/uL — ABNORMAL LOW (ref 150–400)
RBC: 4.09 MIL/uL — ABNORMAL LOW (ref 4.22–5.81)
RDW: 14.1 % (ref 11.5–15.5)
WBC: 18.2 K/uL — ABNORMAL HIGH (ref 4.0–10.5)

## 2013-05-01 LAB — COMPREHENSIVE METABOLIC PANEL
Albumin: 3 g/dL — ABNORMAL LOW (ref 3.5–5.2)
BUN: 41 mg/dL — ABNORMAL HIGH (ref 6–23)
Calcium: 8.8 mg/dL (ref 8.4–10.5)
Chloride: 101 mEq/L (ref 96–112)
Creatinine, Ser: 1.43 mg/dL — ABNORMAL HIGH (ref 0.50–1.35)
Total Bilirubin: 1.5 mg/dL — ABNORMAL HIGH (ref 0.3–1.2)

## 2013-05-01 LAB — COMPREHENSIVE METABOLIC PANEL WITH GFR
ALT: 16 U/L (ref 0–53)
AST: 23 U/L (ref 0–37)
Alkaline Phosphatase: 41 U/L (ref 39–117)
CO2: 23 meq/L (ref 19–32)
GFR calc Af Amer: 49 mL/min — ABNORMAL LOW (ref >90)
GFR calc non Af Amer: 42 mL/min — ABNORMAL LOW (ref >90)
Glucose, Bld: 154 mg/dL — ABNORMAL HIGH (ref 70–99)
Potassium: 3.9 meq/L (ref 3.5–5.1)
Sodium: 134 meq/L — ABNORMAL LOW (ref 135–145)
Total Protein: 6.3 g/dL (ref 6.0–8.3)

## 2013-05-01 LAB — URINALYSIS, ROUTINE W REFLEX MICROSCOPIC
Ketones, ur: NEGATIVE mg/dL
Nitrite: NEGATIVE
Specific Gravity, Urine: 1.025 (ref 1.005–1.030)
pH: 5 (ref 5.0–8.0)

## 2013-05-01 LAB — PROTIME-INR
INR: 1.78 — ABNORMAL HIGH (ref 0.00–1.49)
Prothrombin Time: 20.1 seconds — ABNORMAL HIGH (ref 11.6–15.2)

## 2013-05-01 LAB — URINE MICROSCOPIC-ADD ON

## 2013-05-01 LAB — CG4 I-STAT (LACTIC ACID): Lactic Acid, Venous: 1.65 mmol/L (ref 0.5–2.2)

## 2013-05-01 MED ORDER — CEFPODOXIME PROXETIL 200 MG PO TABS: 200.0000 mg | ORAL_TABLET | Freq: Two times a day (BID) | ORAL | Status: DC

## 2013-05-01 MED ORDER — ACETAMINOPHEN 325 MG PO TABS
650.0000 mg | ORAL_TABLET | Freq: Four times a day (QID) | ORAL | Status: DC | PRN
  Administered 2013-05-01: 650 mg via ORAL
  Filled 2013-05-01: qty 2

## 2013-05-01 MED ORDER — DEXTROSE 5 % IV SOLN
1.0000 g | Freq: Once | INTRAVENOUS | Status: AC
  Administered 2013-05-01: 1 g via INTRAVENOUS
  Filled 2013-05-01: qty 10

## 2013-05-01 MED ORDER — SODIUM CHLORIDE 0.9 % IV BOLUS (SEPSIS)
1000.0000 mL | Freq: Once | INTRAVENOUS | Status: AC
  Administered 2013-05-01: 1000 mL via INTRAVENOUS

## 2013-05-01 NOTE — Discharge Instructions (Signed)
 Urinary Tract Infection Urinary tract infections (UTIs) can develop anywhere along your urinary tract. Your urinary tract is your body's drainage system for removing wastes and extra water. Your urinary tract includes two kidneys, two ureters, a bladder, and a urethra. Your kidneys are a pair of bean-shaped organs. Each kidney is about the size of your fist. They are located below your ribs, one on each side of your spine. CAUSES Infections are caused by microbes, which are microscopic organisms, including fungi, viruses, and bacteria. These organisms are so small that they can only be seen through a microscope. Bacteria are the microbes that most commonly cause UTIs. SYMPTOMS  Symptoms of UTIs may vary by age and gender of the patient and by the location of the infection. Symptoms in young women typically include a frequent and intense urge to urinate and a painful, burning feeling in the bladder or urethra during urination. Older women and men are more likely to be tired, shaky, and weak and have muscle aches and abdominal pain. A fever may mean the infection is in your kidneys. Other symptoms of a kidney infection include pain in your back or sides below the ribs, nausea, and vomiting. DIAGNOSIS To diagnose a UTI, your caregiver will ask you about your symptoms. Your caregiver also will ask to provide a urine sample. The urine sample will be tested for bacteria and white blood cells. White blood cells are made by your body to help fight infection. TREATMENT  Typically, UTIs can be treated with medication. Because most UTIs are caused by a bacterial infection, they usually can be treated with the use of antibiotics. The choice of antibiotic and length of treatment depend on your symptoms and the type of bacteria causing your infection. HOME CARE INSTRUCTIONS  If you were prescribed antibiotics, take them exactly as your caregiver instructs you. Finish the medication even if you feel better after you  have only taken some of the medication.  Drink enough water and fluids to keep your urine clear or pale yellow.  Avoid caffeine, tea, and carbonated beverages. They tend to irritate your bladder.  Empty your bladder often. Avoid holding urine for long periods of time.  Empty your bladder before and after sexual intercourse.  After a bowel movement, women should cleanse from front to back. Use each tissue only once. SEEK MEDICAL CARE IF:   You have back pain.  You develop a fever.  Your symptoms do not begin to resolve within 3 days. SEEK IMMEDIATE MEDICAL CARE IF:   You have severe back pain or lower abdominal pain.  You develop chills.  You have nausea or vomiting.  You have continued burning or discomfort with urination. MAKE SURE YOU:   Understand these instructions.  Will watch your condition.  Will get help right away if you are not doing well or get worse. Document Released: 09/19/2005 Document Revised: 06/10/2012 Document Reviewed: 01/18/2012 [facility] Patient Information 2013 [place], [place].     Please be sure to contact Dr. Landy and follow up with him on Monday for a recheck.  Return if you feel worse, develop vomiting, fainting, or have any other concerns.

## 2013-05-01 NOTE — Progress Notes (Signed)
CSW met with pt and pt daughter in law at bedside. Patient is an independent resident at [facility]. Patient works 4 days a week.   Doree Albee  [phone] 05/01/2013 11:54am

## 2013-05-01 NOTE — ED Notes (Addendum)
Pt from independent living [facility].  Temperature of 102.5 F at facility.  Pt reports body aches that got worse yesterday.  Pt reports burning with urination

## 2013-05-01 NOTE — ED Provider Notes (Signed)
History     CSN: [id number]  Arrival date & time 05/01/13  0902   First MD Initiated Contact with Patient 05/01/13 [phone]      Chief Complaint  Patient presents with  . Fever  . Generalized Body Aches    (Consider location/radiation/quality/duration/timing/severity/associated sxs/prior treatment) HPI Comments: [facility], is sent here for evaluation due to increase weakness and fever to 102. Patient is currently on Coumadin do to apparent history of atrial fibrillation, has a history of coronary disease status post CABG, followed by Dr. wall. Patient reports he has not been seen by Dr. wall in several years. Otherwise he follows up with Dr. Eulah Citizen. Patient denies any nausea or vomiting, headache, stiff neck or rash. He reports he has a mild dry cough but is not concerned about it. He reports he does have some significant pain with urinating in the last 2 days. He denies prior history of urinary tract infection. He does have a history of prostate cancer and according to family, seems to be getting worse. 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 Patient denies any chest pain, shortness of breath. He denies abdominal pain, flank or back pain. He reports he has had a few loose stools in the last couple days as well. He denies any blood.  Did not eat today or last night.  Patient is a 79 y.o. male presenting with fever. The history is provided by the patient and a relative.  Fever Associated symptoms: cough, diarrhea and dysuria   Associated symptoms: no nausea, no rash and no vomiting     Past Medical History  Diagnosis Date  . ANEMIA-IRON DEFICIENCY 05/27/2007  . ANXIETY 11/14/2007  . Intermittent high-grade heart block 09/05/2009  . BENIGN PROSTATIC HYPERTROPHY 11/14/2007  . CAROTID ARTERY DISEASE 10/02/2010  .  Pacemaker-St. Jude 02/02/2008    St. Jude  . COLONIC POLYPS, HX OF 05/27/2007  . CORONARY ARTERY DISEASE 11/14/2007    prior bypass surgery; ejection fraction 40-45% 2012  . DEPRESSION 11/14/2007  . DIABETES MELLITUS, TYPE II 11/14/2007  . DIVERTICULITIS, HX OF 05/27/2007  . DVT (deep venous thrombosis) 03/21/2011  . Edema 01/15/2011  . FOOT PAIN, BILATERAL 12/08/2010  . GERD 11/14/2007  . GROIN PAIN 09/24/2008  . HYPERLIPIDEMIA 11/14/2007  . HYPERTENSION 11/14/2007  . HYPOTENSION, ORTHOSTATIC 03/06/2011  . Long term current use of anticoagulant 03/21/2011  . Memory loss 06/01/2008  . OSTEOPOROSIS 06/02/2010  . PERIPHERAL VASCULAR DISEASE 11/14/2007  . RASH-NONVESICULAR 01/22/2008  . RENAL INSUFFICIENCY 11/14/2007  . SECONDARY DM W/PERIPHERAL CIRC D/O UNCONTROLLED 11/07/2010  . SYNCOPE 09/08/2008  . Unspecified hearing loss 05/13/2009  . VERTEBRAL FRACTURE 09/05/2009  . Prostate cancer   . Acute cystitis   . Myocardial infarction   . Seborrheic keratosis 06/05/2011  . Chronic pain 04/24/2011  . Unspecified hereditary and idiopathic peripheral neuropathy 04/24/2011  . Myalgia and myositis, unspecified 04/24/2011  . Other malaise and fatigue 04/24/2011  . Abnormality of gait 04/24/2011  . Type II or unspecified type diabetes mellitus with neurological manifestations, uncontrolled(250.62) 03/27/2011  . Urinary frequency 03/27/2011  . Unspecified disorder of kidney and ureter 03/23/2011  . Osteoarthrosis, unspecified whether generalized or localized, unspecified site 03/23/2011    Past Surgical History  Procedure Laterality Date  . Coronary artery bypass graft    . S/p coronary stent x 1    .  Cholecystectomy    . S/p bilat knee replacement  1992  . Pacemaker insertion  2012  . Prostate needle biopsy    . Cystoscopy      Family History  Problem Relation Age of Onset  . Diabetes Father     60    History  Substance Use Topics  . Smoking status: Former Smoker    Quit date: 10/18/1983  . Smokeless  tobacco: Never Used  . Alcohol Use: No      Review of Systems  Constitutional: Positive for fever and appetite change.  Respiratory: Positive for cough.   Gastrointestinal: Positive for diarrhea. Negative for nausea, vomiting and abdominal pain.  Genitourinary: Positive for dysuria.  Musculoskeletal: Negative for back pain.  Skin: Negative for rash and wound.  All other systems reviewed and are negative.    Allergies  Review of patient's allergies indicates no known allergies.  Home Medications   Current Outpatient Rx  Name  Route  Sig  Dispense  Refill  . alendronate (FOSAMAX) 70 MG tablet   Oral   Take 70 mg by mouth every 7 (seven) days. Take with a full glass of water on an empty stomach.          Marland Kitchen atorvastatin (LIPITOR) 40 MG tablet   Oral   Take 40 mg by mouth daily.           . fenofibrate 160 MG tablet   Oral   Take 160 mg by mouth daily.           . furosemide (LASIX) 20 MG tablet   Oral   Take 20 mg by mouth every other day. 1 every other day         . NITROSTAT 0.4 MG SL tablet      DISSOLVE ONE TABLET UNDER TONGUE AS NEEDED FOR CHEST PAIN.   25 tablet   5   . TOVIAZ 8 MG TB24      1 tablet. Daily for bladder in morning         . warfarin (COUMADIN) 5 MG tablet   Oral   Take 2.5-5 mg by mouth daily. Pt takes 5mg  on Monday, Wed, Fri. Takes 2.5mg  other days         . cefpodoxime (VANTIN) 200 MG tablet   Oral   Take 1 tablet (200 mg total) by mouth 2 (two) times daily.   20 tablet   0     BP 96/51  Pulse 95  Temp(Src) 101.7 F (38.7 C) (Rectal)  SpO2 95%  Physical Exam  Nursing note and vitals reviewed. Constitutional: He appears well-developed and well-nourished. He appears listless.  Non-toxic appearance. He appears ill. No distress.  HENT:  Head: Normocephalic and atraumatic.  Mouth/Throat: Oropharynx is clear and moist. No oropharyngeal exudate.  Eyes: Conjunctivae and EOM are normal. No scleral icterus.  Neck: Normal  range of motion. Neck supple.  Cardiovascular: Normal rate, regular rhythm and intact distal pulses.   Murmur heard. Pulmonary/Chest: Tachypnea noted. He has no wheezes. He has no rales.  Abdominal: Soft. He exhibits no distension. There is no tenderness. There is no rebound and no guarding.  Neurological: He appears listless. Coordination normal.  Skin: Skin is warm. No rash noted. He is not diaphoretic.    ED Course  Procedures (including critical care time)  Labs Reviewed  URINALYSIS, ROUTINE W REFLEX MICROSCOPIC - Abnormal; Notable for the following:    Color, Urine AMBER (*)    APPearance TURBID (*)  Hgb urine dipstick MODERATE (*)    Bilirubin Urine SMALL (*)    Leukocytes, UA LARGE (*)    All other components within normal limits  CBC WITH DIFFERENTIAL - Abnormal; Notable for the following:    WBC 18.2 (*)    RBC 4.09 (*)    Hemoglobin 12.5 (*)    HCT 37.2 (*)    Platelets 134 (*)    Neutrophils Relative 86 (*)    Neutro Abs 15.6 (*)    Lymphocytes Relative 5 (*)    Monocytes Absolute 1.6 (*)    All other components within normal limits  COMPREHENSIVE METABOLIC PANEL - Abnormal; Notable for the following:    Sodium 134 (*)    Glucose, Bld 154 (*)    BUN 41 (*)    Creatinine, Ser 1.43 (*)    Albumin 3.0 (*)    Total Bilirubin 1.5 (*)    GFR calc non Af Amer 42 (*)    GFR calc Af Amer 49 (*)    All other components within normal limits  PROTIME-INR - Abnormal; Notable for the following:    Prothrombin Time 20.1 (*)    INR 1.78 (*)    All other components within normal limits  URINE MICROSCOPIC-ADD ON - Abnormal; Notable for the following:    Bacteria, UA FEW (*)    All other components within normal limits  CULTURE, BLOOD (ROUTINE X 2)  CULTURE, BLOOD (ROUTINE X 2)  URINE CULTURE  CG4 I-STAT (LACTIC ACID)   Dg Chest Port 1 View  05/01/2013  *RADIOLOGY REPORT*  Clinical Data: Chest pain for 2 days  PORTABLE CHEST - 1 VIEW  Comparison: 03/20/2011  Findings:  The cardiac shadow is enlarged.  Postsurgical changes are seen.  A left sided pacing device is noted and stable.  The lungs are clear bilaterally.  IMPRESSION: No acute abnormality noted.   Original Report Authenticated By: Alcide Clever, M.D.      1. Complicated UTI (urinary tract infection)   2. Renal insufficiency     ra sat is 95% and I interpret to be adequate  ECG at time 0945 shows SR at rate 90, first degree AV block, inferior q waves, non specific ST and T wave abn's in inf and lateral leads.  Abn ECG.  Evidence of inf infarct and first degree block seen previously on 03/20/11.  9:48 AM In review of records, in 2009, ECHO showed moderate AS and mild MR.   11:49 AM Pt likely with UTI with [facility] WBC's on UA.  CXR shows no infiltrates.  Prior ECHO gives reasons for heart murmur on my exam.  IV rocephin given.  Will give meal, ambulate, recheck vital signs for stability.  Pt's lactic acid is normal, unlikely to be septic.     12:33 PM Pt remains orthostatic after first L of IVF's, with drop in systolic from 113 to 96 from laying to standing.  Will give another bolus and reassess.  1:01 PM Pt has ambualted, feels improved, family agrees pt appears more like himself. Questions answered, Rx for vantin given and instructed pt to follow up closely with PCP MDM  Pt likely febrile.  Not septic appearing, but is listless, likely mildly dehdyrated from not eating or drinking much in 1.5 days.  Also presumed new murmur, may need admission for ECHO to assess for endocarditis.  No mention of murmur on prior records although likely pt has mild MR or AS.  Given dysuria, I suspect complicated UTI is  likely source.          Gavin Pound. Korrey Schleicher, MD 05/01/13 1301

## 2013-05-01 NOTE — Telephone Encounter (Signed)
Aggie Cosier RN apartment nurse from [facility] called and stated that patient temperature was 102.5 and blood pressure was 106/44. Since we have no provider her I asked her to send patient to the hospital

## 2013-05-01 NOTE — ED Notes (Signed)
[id number]<JY> Expected date:<BR> Expected time:<BR> Means of arrival:<BR> Comments:<BR> EMS - fever **Room 14

## 2013-05-03 ENCOUNTER — Encounter (HOSPITAL_COMMUNITY): Payer: Self-pay

## 2013-05-03 ENCOUNTER — Emergency Department (HOSPITAL_COMMUNITY)
Admission: EM | Admit: 2013-05-03 | Discharge: 2013-05-03 | Disposition: A | Discharge status: Home / Self Care | Payer: Medicare Other | Attending: Emergency Medicine | Admitting: Emergency Medicine

## 2013-05-03 DIAGNOSIS — Z79899 Other long term (current) drug therapy: Secondary | ICD-10-CM | POA: Insufficient documentation

## 2013-05-03 DIAGNOSIS — E1142 Type 2 diabetes mellitus with diabetic polyneuropathy: Secondary | ICD-10-CM | POA: Insufficient documentation

## 2013-05-03 DIAGNOSIS — Z8739 Personal history of other diseases of the musculoskeletal system and connective tissue: Secondary | ICD-10-CM | POA: Insufficient documentation

## 2013-05-03 DIAGNOSIS — Z872 Personal history of diseases of the skin and subcutaneous tissue: Secondary | ICD-10-CM | POA: Insufficient documentation

## 2013-05-03 DIAGNOSIS — I2510 Atherosclerotic heart disease of native coronary artery without angina pectoris: Secondary | ICD-10-CM | POA: Insufficient documentation

## 2013-05-03 DIAGNOSIS — I252 Old myocardial infarction: Secondary | ICD-10-CM | POA: Insufficient documentation

## 2013-05-03 DIAGNOSIS — Z87448 Personal history of other diseases of urinary system: Secondary | ICD-10-CM | POA: Insufficient documentation

## 2013-05-03 DIAGNOSIS — I10 Essential (primary) hypertension: Secondary | ICD-10-CM | POA: Insufficient documentation

## 2013-05-03 DIAGNOSIS — Z7901 Long term (current) use of anticoagulants: Secondary | ICD-10-CM | POA: Insufficient documentation

## 2013-05-03 DIAGNOSIS — Z8659 Personal history of other mental and behavioral disorders: Secondary | ICD-10-CM | POA: Insufficient documentation

## 2013-05-03 DIAGNOSIS — E1149 Type 2 diabetes mellitus with other diabetic neurological complication: Secondary | ICD-10-CM | POA: Insufficient documentation

## 2013-05-03 DIAGNOSIS — E785 Hyperlipidemia, unspecified: Secondary | ICD-10-CM | POA: Insufficient documentation

## 2013-05-03 DIAGNOSIS — Z86718 Personal history of other venous thrombosis and embolism: Secondary | ICD-10-CM | POA: Insufficient documentation

## 2013-05-03 DIAGNOSIS — G8929 Other chronic pain: Secondary | ICD-10-CM | POA: Insufficient documentation

## 2013-05-03 DIAGNOSIS — Z8669 Personal history of other diseases of the nervous system and sense organs: Secondary | ICD-10-CM | POA: Insufficient documentation

## 2013-05-03 DIAGNOSIS — Z862 Personal history of diseases of the blood and blood-forming organs and certain disorders involving the immune mechanism: Secondary | ICD-10-CM | POA: Insufficient documentation

## 2013-05-03 DIAGNOSIS — C61 Malignant neoplasm of prostate: Secondary | ICD-10-CM | POA: Insufficient documentation

## 2013-05-03 DIAGNOSIS — N390 Urinary tract infection, site not specified: Secondary | ICD-10-CM | POA: Insufficient documentation

## 2013-05-03 DIAGNOSIS — Z87891 Personal history of nicotine dependence: Secondary | ICD-10-CM | POA: Insufficient documentation

## 2013-05-03 DIAGNOSIS — M810 Age-related osteoporosis without current pathological fracture: Secondary | ICD-10-CM | POA: Insufficient documentation

## 2013-05-03 DIAGNOSIS — N400 Enlarged prostate without lower urinary tract symptoms: Secondary | ICD-10-CM | POA: Insufficient documentation

## 2013-05-03 DIAGNOSIS — Z8679 Personal history of other diseases of the circulatory system: Secondary | ICD-10-CM | POA: Insufficient documentation

## 2013-05-03 LAB — URINE CULTURE: Colony Count: >=100000

## 2013-05-03 LAB — URINE MICROSCOPIC-ADD ON

## 2013-05-03 LAB — URINALYSIS, ROUTINE W REFLEX MICROSCOPIC
Bilirubin Urine: NEGATIVE
Glucose, UA: NEGATIVE mg/dL
Hgb urine dipstick: NEGATIVE
Protein, ur: NEGATIVE mg/dL

## 2013-05-03 MED ORDER — CIPROFLOXACIN HCL 500 MG PO TABS: 500.0000 mg | ORAL_TABLET | Freq: Two times a day (BID) | ORAL | Status: DC

## 2013-05-03 MED ORDER — FESOTERODINE FUMARATE ER 8 MG PO TB24: 8.0000 mg | ORAL_TABLET | Freq: Every day | ORAL | Status: DC

## 2013-05-03 MED ORDER — FESOTERODINE FUMARATE ER 4 MG PO TB24
4.0000 mg | ORAL_TABLET | Freq: Every day | ORAL | Status: DC
  Administered 2013-05-03: 4 mg via ORAL
  Filled 2013-05-03: qty 1

## 2013-05-03 MED ORDER — CIPROFLOXACIN HCL 500 MG PO TABS
500.0000 mg | ORAL_TABLET | Freq: Once | ORAL | Status: AC
  Administered 2013-05-03: 500 mg via ORAL
  Filled 2013-05-03: qty 1

## 2013-05-03 NOTE — ED Notes (Addendum)
Pt sts pain with urination, increased frequency and decreased output x 1 week.  Pt sts hematuria x 2 last night.  Hx of prostate cancer.  Pt's daughter sts Pt was recently taken off Avodart and takes a medication for urinary retention.    Pt was seen at [facility] x 2 days ago for same complaint and diagnosed with a UTI and Renal Insufficiency.

## 2013-05-03 NOTE — ED Notes (Signed)
Patient's daughter reports that the patient has had urinary retention and frequency x 1 week. Patient was seen for the same 2 days ago. Patient was placed on antibiotics. Patient's daughter states the fever is gone, but is still having urinary retention.

## 2013-05-03 NOTE — ED Provider Notes (Signed)
History     CSN: [id number]  Arrival date & time 05/03/13  4098   First MD Initiated Contact with Patient 05/03/13 0900      Chief Complaint  Patient presents with  . Urinary Retention    (Consider location/radiation/quality/duration/timing/severity/associated sxs/prior treatment) HPI..... dysuria, frequent urination, minimal urinary output for several days. Patient seen in emergency department on Friday and started on Vantin for a urinary tract infection. Blood cultures were drawn.   he is afebrile. Alert. Severity is mild to moderate. Nothing makes symptoms better or worse. Patient is eating and drinking well.  Past Medical History  Diagnosis Date  . ANEMIA-IRON DEFICIENCY 05/27/2007  . ANXIETY 11/14/2007  . Intermittent high-grade heart block 09/05/2009  . BENIGN PROSTATIC HYPERTROPHY 11/14/2007  . CAROTID ARTERY DISEASE 10/02/2010  . Pacemaker-St. Jude 02/02/2008    St. Jude  . COLONIC POLYPS, HX OF 05/27/2007  . CORONARY ARTERY DISEASE 11/14/2007    prior bypass surgery; ejection fraction 40-45% 2012  . DEPRESSION 11/14/2007  . DIABETES MELLITUS, TYPE II 11/14/2007  . DIVERTICULITIS, HX OF 05/27/2007  . DVT (deep venous thrombosis) 03/21/2011  . Edema 01/15/2011  . FOOT PAIN, BILATERAL 12/08/2010  . GERD 11/14/2007  . GROIN PAIN 09/24/2008  . HYPERLIPIDEMIA 11/14/2007  . HYPERTENSION 11/14/2007  . HYPOTENSION, ORTHOSTATIC 03/06/2011  . Long term current use of anticoagulant 03/21/2011  . Memory loss 06/01/2008  . OSTEOPOROSIS 06/02/2010  . PERIPHERAL VASCULAR DISEASE 11/14/2007  . RASH-NONVESICULAR 01/22/2008  . RENAL INSUFFICIENCY 11/14/2007  . SECONDARY DM W/PERIPHERAL CIRC D/O UNCONTROLLED 11/07/2010  . SYNCOPE 09/08/2008  . Unspecified hearing loss 05/13/2009  . VERTEBRAL FRACTURE 09/05/2009  . Prostate cancer   . Acute cystitis   . Myocardial infarction   . Seborrheic keratosis 06/05/2011  . Chronic pain 04/24/2011  . Unspecified hereditary and idiopathic peripheral  neuropathy 04/24/2011  . Myalgia and myositis, unspecified 04/24/2011  . Other malaise and fatigue 04/24/2011  . Abnormality of gait 04/24/2011  . Type II or unspecified type diabetes mellitus with neurological manifestations, uncontrolled(250.62) 03/27/2011  . Urinary frequency 03/27/2011  . Unspecified disorder of kidney and ureter 03/23/2011  . Osteoarthrosis, unspecified whether generalized or localized, unspecified site 03/23/2011    Past Surgical History  Procedure Laterality Date  . Coronary artery bypass graft    . S/p coronary stent x 1    . Cholecystectomy    . S/p bilat knee replacement  1992  . Pacemaker insertion  2012  . Prostate needle biopsy    . Cystoscopy      Family History  Problem Relation Age of Onset  . Diabetes Father     84    History  Substance Use Topics  . Smoking status: Former Smoker    Quit date: 10/18/1983  . Smokeless tobacco: Never Used  . Alcohol Use: No      Review of Systems  All other systems reviewed and are negative.    Allergies  Review of patient's allergies indicates no known allergies.  Home Medications   Current Outpatient Rx  Name  Route  Sig  Dispense  Refill  . alendronate (FOSAMAX) 70 MG tablet   Oral   Take 70 mg by mouth every 7 (seven) days. Take with a full glass of water on an empty stomach.  Taken on Saturdays.         Marland Kitchen atorvastatin (LIPITOR) 40 MG tablet   Oral   Take 40 mg by mouth daily.           Marland Kitchen  cefpodoxime (VANTIN) 200 MG tablet   Oral   Take 200 mg by mouth 2 (two) times daily. 10 day course of therapy started 05/01/13.         . fenofibrate 160 MG tablet   Oral   Take 160 mg by mouth daily.           . furosemide (LASIX) 20 MG tablet   Oral   Take 20 mg by mouth every other day.          . nitroGLYCERIN (NITROSTAT) 0.4 MG SL tablet   Sublingual   Place 0.4 mg under the tongue every 5 (five) minutes as needed for chest pain.         Marland Kitchen warfarin (COUMADIN) 2.5 MG tablet   Oral   Take  2.5-5 mg by mouth daily. 2 tabs on MWF; 1 tab on all other days.         . ciprofloxacin (CIPRO) 500 MG tablet   Oral   Take 1 tablet (500 mg total) by mouth 2 (two) times daily. One po bid x 7 days   14 tablet   0   . fesoterodine (TOVIAZ) 8 MG TB24   Oral   Take 1 tablet (8 mg total) by mouth daily.   30 tablet   1   . TOVIAZ 8 MG TB24   Oral   Take 8 mg by mouth daily.            BP 129/58  Pulse 56  Temp(Src) 97.8 F (36.6 C) (Oral)  Resp 18  Wt 183 lb 4 oz (83.122 kg)  BMI 26.68 kg/m2  SpO2 97%  Physical Exam  Nursing note and vitals reviewed. Constitutional: He is oriented to person, place, and time. He appears well-developed and well-nourished.  Nontoxic appearing  HENT:  Head: Normocephalic and atraumatic.  Eyes: Conjunctivae and EOM are normal. Pupils are equal, round, and reactive to light.  Neck: Normal range of motion. Neck supple.  Cardiovascular: Normal rate, regular rhythm and normal heart sounds.   Pulmonary/Chest: Effort normal and breath sounds normal.  Abdominal: Soft. Bowel sounds are normal.  No suprapubic tenderness  Genitourinary:  No CVA tenderness  Musculoskeletal: Normal range of motion.  Neurological: He is alert and oriented to person, place, and time.  Skin: Skin is warm and dry.  Psychiatric: He has a normal mood and affect.    ED Course  Procedures (including critical care time)  Labs Reviewed  URINALYSIS, ROUTINE W REFLEX MICROSCOPIC - Abnormal; Notable for the following:    APPearance CLOUDY (*)    Leukocytes, UA LARGE (*)    All other components within normal limits  URINE MICROSCOPIC-ADD ON - Abnormal; Notable for the following:    Bacteria, UA MANY (*)    All other components within normal limits  URINE CULTURE   No results found.   1. Urinary tract infection       MDM   patient is nontoxic appearing. Blood cultures reviewed from Friday and are negative.  Urine culture shows gram-negative rods.  Will  discontinue all antibiotic and start Cipro.  Also prescription for Toviaz.  Patient has followup with his urologist early in the week.        Donnetta Hutching, MD 05/03/13 [phone]

## 2013-05-03 NOTE — ED Notes (Signed)
Pt escorted to discharge window. Verbalized understanding discharge instructions. In no acute distress.   

## 2013-05-04 LAB — URINE CULTURE: Colony Count: NO GROWTH

## 2013-05-04 NOTE — ED Notes (Signed)
Post ED Visit - Positive Culture Follow-up  Culture report reviewed by antimicrobial stewardship pharmacist: []  Marlou Sa, Pharm.D., BCPS [x]  Celedonio Miyamoto, Pharm.D., BCPS []  Georgina Pillion, Pharm.D., BCPS []  [place], [address].D., BCPS, AAHIVP []  Estella Husk, Pharm.D., BCPS, AAHIV  Positive urine culture Treated with Cefpodzime, organism sensitive to the same and no further patient follow-up is required at this time.  Larena Sox 05/04/2013, 1:38 PM

## 2013-05-05 ENCOUNTER — Telehealth: Payer: Self-pay | Admitting: Cardiology

## 2013-05-05 NOTE — Telephone Encounter (Signed)
New Problem:    Patient called in because Dr. Annabell Howells gave his Cipro and wanted him to call in to see if it would interfere with any of his other medication.  Please call back.

## 2013-05-05 NOTE — Telephone Encounter (Signed)
Left pt a message to call back. 

## 2013-05-06 NOTE — Telephone Encounter (Signed)
Pt wants Dr Daleen Squibb and the Coumadin clinic to know that Dr Annabell Howells put him on Cipro 500 mg bid X 7 days. Will forward note to Dr Daleen Squibb and Coumadin clinic.

## 2013-05-07 LAB — CULTURE, BLOOD (ROUTINE X 2)
Culture: NO GROWTH
Culture: NO GROWTH

## 2013-05-07 NOTE — Telephone Encounter (Signed)
Called spoke with pt advised Cipro can interact with Coumadin and increase INR.  Made pt OV for 05/08/13 at 11:15am to check INR.  Pt aware verbalized understanding.

## 2013-05-08 ENCOUNTER — Encounter: Payer: Self-pay | Admitting: *Deleted

## 2013-05-08 ENCOUNTER — Ambulatory Visit (INDEPENDENT_AMBULATORY_CARE_PROVIDER_SITE_OTHER): Payer: Medicare Other | Admitting: *Deleted

## 2013-05-08 DIAGNOSIS — I82409 Acute embolism and thrombosis of unspecified deep veins of unspecified lower extremity: Secondary | ICD-10-CM

## 2013-05-08 DIAGNOSIS — Z7901 Long term (current) use of anticoagulants: Secondary | ICD-10-CM

## 2013-05-12 ENCOUNTER — Encounter: Payer: Self-pay | Admitting: Internal Medicine

## 2013-05-12 ENCOUNTER — Non-Acute Institutional Stay: Payer: Medicare Other | Admitting: Internal Medicine

## 2013-05-12 ENCOUNTER — Telehealth: Payer: Self-pay

## 2013-05-12 VITALS — BP 100/60 | HR 72 | Temp 97.2°F | Ht 69.0 in | Wt 177.0 lb

## 2013-05-12 DIAGNOSIS — D72829 Elevated white blood cell count, unspecified: Secondary | ICD-10-CM

## 2013-05-12 DIAGNOSIS — N3941 Urge incontinence: Secondary | ICD-10-CM

## 2013-05-12 DIAGNOSIS — R351 Nocturia: Secondary | ICD-10-CM

## 2013-05-12 DIAGNOSIS — C61 Malignant neoplasm of prostate: Secondary | ICD-10-CM

## 2013-05-12 DIAGNOSIS — R339 Retention of urine, unspecified: Secondary | ICD-10-CM

## 2013-05-12 DIAGNOSIS — N138 Other obstructive and reflux uropathy: Secondary | ICD-10-CM

## 2013-05-12 DIAGNOSIS — R5383 Other fatigue: Secondary | ICD-10-CM

## 2013-05-12 DIAGNOSIS — R5381 Other malaise: Secondary | ICD-10-CM

## 2013-05-12 DIAGNOSIS — N390 Urinary tract infection, site not specified: Secondary | ICD-10-CM

## 2013-05-12 DIAGNOSIS — N403 Nodular prostate with lower urinary tract symptoms: Secondary | ICD-10-CM

## 2013-05-12 DIAGNOSIS — I10 Essential (primary) hypertension: Secondary | ICD-10-CM

## 2013-05-12 DIAGNOSIS — E1351 Other specified diabetes mellitus with diabetic peripheral angiopathy without gangrene: Secondary | ICD-10-CM

## 2013-05-12 NOTE — Telephone Encounter (Signed)
Mr. Demicco daughter called left message on voice mail that she had made appt with Dr. Annabell Howells for 05/27/13. I had made appt for Mr. Bukhari to see the NP tomorrow 05/13/13 at 10:30 with Denna Haggard. I checked with Dr. Chilton Si, he said it would be ok to cancel appt with NP tomorrow and keep appt Dr. Annabell Howells 05/27/13. I called the daughter back, she would feel more comfortable for Dr. Annabell Howells to see him to check his prostate and cancer. The NP was just addressing the urine flow problem. I told her that Dr. Chilton Si said it would be ok to cancel appt for tomorrow. Called Dr Belva Crome office and cancelled 05/13/13 appt with Denna Haggard NP. Kaylyn Layer, CMA

## 2013-05-22 ENCOUNTER — Encounter: Payer: Self-pay | Admitting: Internal Medicine

## 2013-05-26 ENCOUNTER — Telehealth: Payer: Self-pay | Admitting: Cardiology

## 2013-05-26 NOTE — Telephone Encounter (Signed)
Let us make it Dr. Jens Som.

## 2013-05-26 NOTE — Telephone Encounter (Signed)
New Problem:    Called in wanting to know who his next cardiologist will be.  Please call back.

## 2013-05-26 NOTE — Telephone Encounter (Signed)
Spoke with pt and told him I would forward note to Dr. Daleen Squibb for his recommendation

## 2013-05-26 NOTE — Telephone Encounter (Signed)
I spoke with pt and gave him information from Dr. Daleen Squibb. He will stop by front desk when here for coumadin clinic appt tomorrow to schedule appt with Dr. Jens Som.

## 2013-05-27 ENCOUNTER — Ambulatory Visit (INDEPENDENT_AMBULATORY_CARE_PROVIDER_SITE_OTHER): Payer: Medicare Other | Admitting: *Deleted

## 2013-05-27 DIAGNOSIS — I82409 Acute embolism and thrombosis of unspecified deep veins of unspecified lower extremity: Secondary | ICD-10-CM

## 2013-05-27 DIAGNOSIS — Z7901 Long term (current) use of anticoagulants: Secondary | ICD-10-CM

## 2013-05-27 LAB — POCT INR: INR: 2.2

## 2013-06-16 ENCOUNTER — Encounter: Payer: Self-pay | Admitting: *Deleted

## 2013-06-24 ENCOUNTER — Ambulatory Visit (INDEPENDENT_AMBULATORY_CARE_PROVIDER_SITE_OTHER): Payer: Medicare Other | Admitting: Pharmacist

## 2013-06-24 DIAGNOSIS — I82409 Acute embolism and thrombosis of unspecified deep veins of unspecified lower extremity: Secondary | ICD-10-CM

## 2013-06-24 DIAGNOSIS — Z7901 Long term (current) use of anticoagulants: Secondary | ICD-10-CM

## 2013-06-24 LAB — POCT INR: INR: 1.7

## 2013-06-28 ENCOUNTER — Other Ambulatory Visit: Payer: Self-pay | Admitting: Cardiology

## 2013-06-30 ENCOUNTER — Other Ambulatory Visit: Payer: Self-pay | Admitting: Geriatric Medicine

## 2013-06-30 MED ORDER — ATORVASTATIN CALCIUM 40 MG PO TABS: 40.0000 mg | ORAL_TABLET | Freq: Every day | ORAL | Status: DC

## 2013-07-01 ENCOUNTER — Other Ambulatory Visit: Payer: Self-pay | Admitting: Geriatric Medicine

## 2013-07-01 MED ORDER — FENOFIBRATE 160 MG PO TABS: 160.0000 mg | ORAL_TABLET | Freq: Every day | ORAL | Status: DC

## 2013-07-15 ENCOUNTER — Ambulatory Visit (INDEPENDENT_AMBULATORY_CARE_PROVIDER_SITE_OTHER): Payer: Medicare Other | Admitting: *Deleted

## 2013-07-15 DIAGNOSIS — I82409 Acute embolism and thrombosis of unspecified deep veins of unspecified lower extremity: Secondary | ICD-10-CM

## 2013-07-15 DIAGNOSIS — Z7901 Long term (current) use of anticoagulants: Secondary | ICD-10-CM

## 2013-07-16 ENCOUNTER — Telehealth: Payer: Self-pay | Admitting: Cardiology

## 2013-07-16 NOTE — Telephone Encounter (Signed)
Pt said he called due to receiving a letter.  He is already scheduled to visit Dr. Graciela Husbands 10/15/13 Zachary Harris

## 2013-07-16 NOTE — Telephone Encounter (Signed)
New Prob     Pt states he needs some assistance with his remote check coming up. Please call.

## 2013-07-20 ENCOUNTER — Ambulatory Visit (INDEPENDENT_AMBULATORY_CARE_PROVIDER_SITE_OTHER): Payer: Medicare Other | Admitting: *Deleted

## 2013-07-20 ENCOUNTER — Encounter: Payer: Self-pay | Admitting: Internal Medicine

## 2013-07-20 DIAGNOSIS — Z950 Presence of cardiac pacemaker: Secondary | ICD-10-CM

## 2013-07-20 DIAGNOSIS — I441 Atrioventricular block, second degree: Secondary | ICD-10-CM

## 2013-07-21 ENCOUNTER — Telehealth: Payer: Self-pay | Admitting: Internal Medicine

## 2013-07-21 LAB — REMOTE PACEMAKER DEVICE
AL AMPLITUDE: 3 mv
AL IMPEDENCE PM: 450 Ohm
BAMS-0001: 180 {beats}/min
BAMS-0003: 70 {beats}/min
DEVICE MODEL PM: 7228315
RV LEAD AMPLITUDE: 11.4 mv
RV LEAD IMPEDENCE PM: 600 Ohm

## 2013-07-21 NOTE — Telephone Encounter (Signed)
Transmission received, left message for patient. 

## 2013-07-21 NOTE — Telephone Encounter (Signed)
New Prob      Pt states he is having some difficulty with his transmitter box. Would like to speak to someone from device clinic.

## 2013-07-25 ENCOUNTER — Other Ambulatory Visit: Payer: Self-pay | Admitting: Internal Medicine

## 2013-07-28 ENCOUNTER — Non-Acute Institutional Stay: Payer: Medicare Other | Admitting: Internal Medicine

## 2013-07-28 ENCOUNTER — Encounter: Payer: Self-pay | Admitting: Internal Medicine

## 2013-07-28 VITALS — BP 104/58 | HR 68 | Ht 69.0 in | Wt 184.0 lb

## 2013-07-28 DIAGNOSIS — E785 Hyperlipidemia, unspecified: Secondary | ICD-10-CM

## 2013-07-28 DIAGNOSIS — D509 Iron deficiency anemia, unspecified: Secondary | ICD-10-CM

## 2013-07-28 DIAGNOSIS — I10 Essential (primary) hypertension: Secondary | ICD-10-CM

## 2013-07-28 DIAGNOSIS — E1149 Type 2 diabetes mellitus with other diabetic neurological complication: Secondary | ICD-10-CM

## 2013-07-28 DIAGNOSIS — N259 Disorder resulting from impaired renal tubular function, unspecified: Secondary | ICD-10-CM

## 2013-07-28 NOTE — Progress Notes (Signed)
Subjective:    Patient ID: Zachary Harris, male    DOB: [birth year], 79 y.o.   MRN: [id number]  HPI Type II or unspecified type diabetes mellitus with neurological manifestations, uncontrolled(250.62); stable  HYPERTENSION: controlled  RENAL INSUFFICIENCY; improved  ANEMIA-IRON DEFICIENCY: nedds follow up  HYPERLIPIDEMIA: needs follow up  Current Outpatient Prescriptions on File Prior to Visit  Medication Sig Dispense Refill  . atorvastatin (LIPITOR) 40 MG tablet Take 1 tablet (40 mg total) by mouth daily. Take 1 tablet once daily for cholesterol.  30 tablet  3  . fenofibrate 160 MG tablet Take 1 tablet (160 mg total) by mouth daily. Take 1 tablet daily for cholesterol each morning.  30 tablet  3  . furosemide (LASIX) 20 MG tablet Take 20 mg by mouth every other day. Take 1 tablet daily to control edema.      . nitroGLYCERIN (NITROSTAT) 0.4 MG SL tablet Place 0.4 mg under the tongue every 5 (five) minutes as needed for chest pain.      Marland Kitchen warfarin (COUMADIN) 2.5 MG tablet TAKE AS DIRECTED BY COUMADIN CLINIC.  50 tablet  3   No current facility-administered medications on file prior to visit.     Review of Systems  Constitutional: Positive for unexpected weight change. Negative for fever, chills, activity change, appetite change and fatigue.  HENT: Positive for hearing loss. Negative for ear pain and sinus pressure.   Eyes: Negative for photophobia, pain and visual disturbance.       Corrective lenses  Respiratory: Positive for shortness of breath. Negative for cough, chest tightness and wheezing.   Cardiovascular: Positive for leg swelling.       Pacemaker. History of syncope related to cardiac arrhythmia.  Gastrointestinal: Negative for abdominal pain and abdominal distention.  Endocrine: Negative for cold intolerance, heat intolerance, polydipsia, polyphagia and polyuria.       History of diabetes.  Genitourinary: Positive for urgency and frequency. Negative for dysuria and  flank pain.       Urge incontinence. Increased frequency which has not responded well to Toviaz. Continues to see urologist.  Musculoskeletal: Positive for back pain, arthralgias and gait problem.       Poor balance and gait disturbance.  Skin:       Rough area on left cheek  Allergic/Immunologic: Negative.   Neurological: Negative for tremors, seizures, facial asymmetry and weakness.  Hematological: Negative.   Psychiatric/Behavioral: Positive for confusion.       Mild to moderate memory disturbance.       Objective:BP 104/58  Pulse 68  Ht 5\' 9"  (1.753 m)  Wt 184 lb (83.462 kg)  BMI 27.16 kg/m2    Physical Exam  Constitutional: He is oriented to person, place, and time.  Frail  HENT:  Head: Normocephalic and atraumatic.  Nose: Nose normal.  Bilateral hearing aids.  Eyes: Conjunctivae are normal. Pupils are equal, round, and reactive to light.  Prescription lenses.  Neck: Normal range of motion. Neck supple. No JVD present. No tracheal deviation present. No thyromegaly present.  Cardiovascular: Normal rate and regular rhythm.  Exam reveals no gallop and no friction rub.   Murmur heard.  Systolic murmur is present with a grade of 1/6  Pulmonary/Chest: Effort normal and breath sounds normal. No respiratory distress. He has no wheezes. He has no rales. He exhibits no tenderness.  Abdominal: Soft. Bowel sounds are normal. He exhibits no distension and no mass. There is no tenderness.  Musculoskeletal: Normal range of motion. He exhibits  edema. He exhibits no tenderness.  Lymphadenopathy:    He has no cervical adenopathy.  Neurological: He is alert and oriented to person, place, and time. He has normal strength. A sensory deficit is present. No cranial nerve deficit. Gait abnormal.  Skin: Skin is warm and dry. No erythema. No pallor.  Rough area on the left cheek  Psychiatric: He has a normal mood and affect. His behavior is normal. Thought content normal.  Patient is in denial  regarding the extent of his memory deficit.     LAB REVIEW 07/20/13 BMP: glu134, BUN 28, creat 1.09  A1c 6.3     Assessment & Plan:  Type II or unspecified type diabetes mellitus with neurological manifestations, uncontrolled(250.62) - Plan: Basic metabolic panel, Hemoglobin A1c  HYPERTENSION; controlled  RENAL INSUFFICIENCY: continue to follow lab  ANEMIA-IRON DEFICIENCY - Plan: CBC with Differential  HYPERLIPIDEMIA - Plan: Lipid panel

## 2013-07-29 ENCOUNTER — Ambulatory Visit (INDEPENDENT_AMBULATORY_CARE_PROVIDER_SITE_OTHER): Payer: Medicare Other | Admitting: *Deleted

## 2013-07-29 DIAGNOSIS — Z7901 Long term (current) use of anticoagulants: Secondary | ICD-10-CM

## 2013-07-29 DIAGNOSIS — I82409 Acute embolism and thrombosis of unspecified deep veins of unspecified lower extremity: Secondary | ICD-10-CM

## 2013-08-12 ENCOUNTER — Encounter: Payer: Self-pay | Admitting: Internal Medicine

## 2013-08-16 DIAGNOSIS — R5381 Other malaise: Secondary | ICD-10-CM | POA: Insufficient documentation

## 2013-08-16 DIAGNOSIS — N138 Other obstructive and reflux uropathy: Secondary | ICD-10-CM | POA: Insufficient documentation

## 2013-08-16 DIAGNOSIS — N3941 Urge incontinence: Secondary | ICD-10-CM | POA: Insufficient documentation

## 2013-08-16 DIAGNOSIS — R351 Nocturia: Secondary | ICD-10-CM | POA: Insufficient documentation

## 2013-08-16 DIAGNOSIS — R339 Retention of urine, unspecified: Secondary | ICD-10-CM | POA: Insufficient documentation

## 2013-08-16 DIAGNOSIS — N390 Urinary tract infection, site not specified: Secondary | ICD-10-CM | POA: Insufficient documentation

## 2013-08-16 NOTE — Progress Notes (Signed)
Subjective:    Patient ID: Zachary Harris, male    DOB: 1923-04-13, 79 y.o.   MRN: [id number]  HPI   Current Outpatient Prescriptions on File Prior to Visit  Medication Sig Dispense Refill  . furosemide (LASIX) 20 MG tablet Take 20 mg by mouth every other day. Take 1 tablet daily to control edema.      . nitroGLYCERIN (NITROSTAT) 0.4 MG SL tablet Place 0.4 mg under the tongue every 5 (five) minutes as needed for chest pain.       No current facility-administered medications on file prior to visit.    Review of Systems  Constitutional: Positive for unexpected weight change. Negative for fever, chills, activity change, appetite change and fatigue.  HENT: Positive for hearing loss. Negative for ear pain and sinus pressure.   Eyes: Negative for photophobia, pain and visual disturbance.       Corrective lenses  Respiratory: Positive for shortness of breath. Negative for cough, chest tightness and wheezing.   Cardiovascular: Positive for leg swelling.       Pacemaker. History of syncope related to cardiac arrhythmia.  Gastrointestinal: Negative for abdominal pain and abdominal distention.  Endocrine: Negative for cold intolerance, heat intolerance, polydipsia, polyphagia and polyuria.       History of diabetes.  Genitourinary: Positive for urgency and frequency. Negative for dysuria and flank pain.       Urge incontinence. Increased frequency which has not responded well to Toviaz. Continues to see urologist.  Musculoskeletal: Positive for back pain, arthralgias and gait problem.       Poor balance and gait disturbance.  Skin:       Rough area on left cheek  Allergic/Immunologic: Negative.   Neurological: Negative for tremors, seizures, facial asymmetry and weakness.  Hematological: Negative.   Psychiatric/Behavioral: Positive for confusion.       Mild to moderate memory disturbance.       Objective:BP 100/60  Pulse 72  Temp(Src) 97.2 F (36.2 C) (Oral)  Ht 5\' 9"  (1.753 m)  Wt  177 lb (80.287 kg)  BMI 26.13 kg/m2    Physical Exam  Constitutional: He is oriented to person, place, and time.  Frail  HENT:  Head: Normocephalic and atraumatic.  Nose: Nose normal.  Bilateral hearing aids.  Eyes: Conjunctivae are normal. Pupils are equal, round, and reactive to light.  Prescription lenses.  Neck: Normal range of motion. Neck supple. No JVD present. No tracheal deviation present. No thyromegaly present.  Cardiovascular: Normal rate and regular rhythm.  Exam reveals no gallop and no friction rub.   Murmur heard.  Systolic murmur is present with a grade of 1/6  Pulmonary/Chest: Effort normal and breath sounds normal. No respiratory distress. He has no wheezes. He has no rales. He exhibits no tenderness.  Abdominal: Soft. Bowel sounds are normal. He exhibits no distension and no mass. There is no tenderness.  Musculoskeletal: Normal range of motion. He exhibits edema. He exhibits no tenderness.  Lymphadenopathy:    He has no cervical adenopathy.  Neurological: He is alert and oriented to person, place, and time. He has normal strength. A sensory deficit is present. No cranial nerve deficit. Gait abnormal.  Skin: Skin is warm and dry. No erythema. No pallor.  Rough area on the left cheek  Psychiatric: He has a normal mood and affect. His behavior is normal. Thought content normal.  Patient is in denial regarding the extent of his memory deficit.      Anti-coag visit on 05/08/2013  Component Date Value Range Status  . INR 05/08/2013 2.1   Final  Admission on 05/03/2013, Discharged on 05/03/2013  Component Date Value Range Status  . Color, Urine 05/03/2013 YELLOW  YELLOW Final  . APPearance 05/03/2013 CLOUDY* CLEAR Final  . Specific Gravity, Urine 05/03/2013 1.012  1.005 - 1.030 Final  . pH 05/03/2013 5.0  5.0 - 8.0 Final  . Glucose, UA 05/03/2013 NEGATIVE  NEGATIVE mg/dL Final  . Hgb urine dipstick 05/03/2013 NEGATIVE  NEGATIVE Final  . Bilirubin Urine  05/03/2013 NEGATIVE  NEGATIVE Final  . Ketones, ur 05/03/2013 NEGATIVE  NEGATIVE mg/dL Final  . Protein, ur 16/09/9603 NEGATIVE  NEGATIVE mg/dL Final  . Urobilinogen, UA 05/03/2013 0.2  0.0 - 1.0 mg/dL Final  . Nitrite 54/08/8118 NEGATIVE  NEGATIVE Final  . Leukocytes, UA 05/03/2013 LARGE* NEGATIVE Final  . Squamous Epithelial / LPF 05/03/2013 RARE  RARE Final  . WBC, UA 05/03/2013 21-50  <3 WBC/hpf Final   WBC CLUMPS  . RBC / HPF 05/03/2013 0-2  <3 RBC/hpf Final  . Bacteria, UA 05/03/2013 MANY* RARE Final  . Specimen Description 05/03/2013 URINE, CLEAN CATCH   Final  . Special Requests 05/03/2013 NONE   Final  . Culture  Setup Time 05/03/2013 05/03/2013 18:54   Final  . Colony Count 05/03/2013 NO GROWTH   Final  . Culture 05/03/2013 NO GROWTH   Final  . Report Status 05/03/2013 05/04/2013 FINAL   Final  Admission on 05/01/2013, Discharged on 05/01/2013  Component Date Value Range Status  . Color, Urine 05/01/2013 AMBER* YELLOW Final   BIOCHEMICALS MAY BE AFFECTED BY COLOR  . APPearance 05/01/2013 TURBID* CLEAR Final  . Specific Gravity, Urine 05/01/2013 1.025  1.005 - 1.030 Final  . pH 05/01/2013 5.0  5.0 - 8.0 Final  . Glucose, UA 05/01/2013 NEGATIVE  NEGATIVE mg/dL Final  . Hgb urine dipstick 05/01/2013 MODERATE* NEGATIVE Final  . Bilirubin Urine 05/01/2013 SMALL* NEGATIVE Final  . Ketones, ur 05/01/2013 NEGATIVE  NEGATIVE mg/dL Final  . Protein, ur 14/78/2956 NEGATIVE  NEGATIVE mg/dL Final  . Urobilinogen, UA 05/01/2013 1.0  0.0 - 1.0 mg/dL Final  . Nitrite 21/30/8657 NEGATIVE  NEGATIVE Final  . Leukocytes, UA 05/01/2013 LARGE* NEGATIVE Final  . Specimen Description 05/01/2013 BLOOD RIGHT ANTECUBITAL   Final  . Special Requests 05/01/2013 BOTTLES DRAWN AEROBIC AND ANAEROBIC   Final  . Culture  Setup Time 05/01/2013 05/01/2013 14:17   Final  . Culture 05/01/2013 NO GROWTH 5 DAYS   Final  . Report Status 05/01/2013 05/07/2013 FINAL   Final  . Specimen Description 05/01/2013  BLOOD LEFT FOREARM   Final  . Special Requests 05/01/2013 BOTTLES DRAWN AEROBIC AND ANAEROBIC   Final  . Culture  Setup Time 05/01/2013 05/01/2013 14:14   Final  . Culture 05/01/2013 NO GROWTH 5 DAYS   Final  . Report Status 05/01/2013 05/07/2013 FINAL   Final  . WBC 05/01/2013 18.2* 4.0 - 10.5 K/uL Final  . RBC 05/01/2013 4.09* 4.22 - 5.81 MIL/uL Final  . Hemoglobin 05/01/2013 12.5* 13.0 - 17.0 g/dL Final  . HCT 84/69/6295 37.2* 39.0 - 52.0 % Final  . MCV 05/01/2013 91.0  78.0 - 100.0 fL Final  . MCH 05/01/2013 30.6  26.0 - 34.0 pg Final  . MCHC 05/01/2013 33.6  30.0 - 36.0 g/dL Final  . RDW 28/41/3244 14.1  11.5 - 15.5 % Final  . Platelets 05/01/2013 134* 150 - 400 K/uL Final  . Neutrophils Relative % 05/01/2013 86* 43 -  77 % Final  . Neutro Abs 05/01/2013 15.6* 1.7 - 7.7 K/uL Final  . Lymphocytes Relative 05/01/2013 5* 12 - 46 % Final  . Lymphs Abs 05/01/2013 0.9  0.7 - 4.0 K/uL Final  . Monocytes Relative 05/01/2013 9  3 - 12 % Final  . Monocytes Absolute 05/01/2013 1.6* 0.1 - 1.0 K/uL Final  . Eosinophils Relative 05/01/2013 0  0 - 5 % Final  . Eosinophils Absolute 05/01/2013 0.0  0.0 - 0.7 K/uL Final  . Basophils Relative 05/01/2013 0  0 - 1 % Final  . Basophils Absolute 05/01/2013 0.0  0.0 - 0.1 K/uL Final  . Sodium 05/01/2013 134* 135 - 145 mEq/L Final  . Potassium 05/01/2013 3.9  3.5 - 5.1 mEq/L Final  . Chloride 05/01/2013 101  96 - 112 mEq/L Final  . CO2 05/01/2013 23  19 - 32 mEq/L Final  . Glucose, Bld 05/01/2013 154* 70 - 99 mg/dL Final  . BUN 62/13/0865 41* 6 - 23 mg/dL Final  . Creatinine, Ser 05/01/2013 1.43* 0.50 - 1.35 mg/dL Final  . Calcium 78/46/9629 8.8  8.4 - 10.5 mg/dL Final  . Total Protein 05/01/2013 6.3  6.0 - 8.3 g/dL Final  . Albumin 52/84/1324 3.0* 3.5 - 5.2 g/dL Final  . AST 40/09/2724 23  0 - 37 U/L Final  . ALT 05/01/2013 16  0 - 53 U/L Final  . Alkaline Phosphatase 05/01/2013 41  39 - 117 U/L Final  . Total Bilirubin 05/01/2013 1.5* 0.3 - 1.2  mg/dL Final  . GFR calc non Af Amer 05/01/2013 42* >90 mL/min Final  . GFR calc Af Amer 05/01/2013 49* >90 mL/min Final   Comment:                                 The eGFR has been calculated                          using the CKD EPI equation.                          This calculation has not been                          validated in all clinical                          situations.                          eGFR's persistently                          <90 mL/min signify                          possible Chronic Kidney Disease.  Marland Kitchen Prothrombin Time 05/01/2013 20.1* 11.6 - 15.2 seconds Final  . INR 05/01/2013 1.78* 0.00 - 1.49 Final  . Specimen Description 05/01/2013 URINE, CLEAN CATCH   Final  . Special Requests 05/01/2013 NONE   Final  . Culture  Setup Time 05/01/2013 05/01/2013 12:37   Final  . Colony Count 05/01/2013 >=100,000 COLONIES/ML   Final  . Culture 05/01/2013 CITROBACTER KOSERI   Final  . Report Status 05/01/2013  05/03/2013 FINAL   Final  . Organism ID, Bacteria 05/01/2013 CITROBACTER KOSERI   Final  . Squamous Epithelial / LPF 05/01/2013 RARE  RARE Final  . WBC, UA 05/01/2013 TOO NUMEROUS TO COUNT  <3 WBC/hpf Final  . RBC / HPF 05/01/2013 3-6  <3 RBC/hpf Final  . Bacteria, UA 05/01/2013 FEW* RARE Final  . Lactic Acid, Venous 05/01/2013 1.65  0.5 - 2.2 mmol/L Final  Anti-coag visit on 04/28/2013  Component Date Value Range Status  . INR 04/28/2013 1.3   Final  Orders Only on 04/20/2013  Component Date Value Range Status  . DEVICE MODEL PM 04/20/2013 [id number]   Final  . [id number] 04/20/2013 Duke Salvia  M.D.   Final  . [id number] 04/20/2013 Duke Salvia  M.D.   Final  . [id number] 04/20/2013 [employer] Monitoring System   Final  . [id number] 04/20/2013    Final                   Value:Pacemaker remote check. Device function reviewed. Impedance, sensing, auto capture thresholds consistent with previous measurements. Histograms appropriate for patient and  level of activity. All other diagnostic data reviewed and is appropriate and                          stable for patient. Real time/magnet EGM shows appropriate sensing and capture. 7 mode switches, <1%, + coumadin.  1 ventricular high rate episode, 15 beats VT. Estimated longevity 8.8 years.  Plan to follow in 3 months remotely, to see in office                          annually.  Next remote 07/20/13.  . ATRIAL PACING PM 04/20/2013 1.2   Final  . VENTRICULAR PACING PM 04/20/2013 4.7   Final  . BATTERY VOLTAGE 04/20/2013 2.96   Final  . AL IMPEDENCE PM 04/20/2013 430   Final  . RV LEAD IMPEDENCE PM 04/20/2013 560   Final  . AL AMPLITUDE 04/20/2013 3.1   Final  . RV LEAD AMPLITUDE 04/20/2013 12   Final  . BAMS-0001 04/20/2013 180   Final  . BAMS-0003 04/20/2013 70   Final  Anti-coag visit on 04/14/2013  Component Date Value Range Status  . INR 04/14/2013 1.6   Final  Anti-coag visit on 03/02/2013  Component Date Value Range Status  . INR 03/02/2013 2.4   Final       Assessment & Plan:  Urine retention: Patient has had 2 emergency room visits and an outpatient visit The problems of urinary tract infection with Citrobacter koseri and subsequent urinary outlet obstruction. Additional contributing factors besides the urine infection could relate to his nodular prostate and his history of prostate cancer. He has an appointment to see Dr. Egbert Garibaldi but did not feel it was soon enough. We will attempt to secure an earlier appointment if possible.  Nodular prostate with urinary obstruction  SECONDARY DM W/PERIPHERAL CIRC D/O UNCONTROLLED: Diabetes seems to be under reasonable control at present time. Last glucose was 154.  Urinary tract infection, site not specified: Citrobacter koseri diagnosed 05/01/13  Other malaise and fatigue: Patient feels worn out by his recent infection and trips to emergency room  Incomplete bladder emptying: Continues to have issues with bladder emptying  HYPERTENSION:  Controlled  Urge incontinence: Symptomatic  Malignant neoplasm of prostate: Historical diagnoses

## 2013-08-19 ENCOUNTER — Ambulatory Visit (INDEPENDENT_AMBULATORY_CARE_PROVIDER_SITE_OTHER): Payer: Medicare Other | Admitting: *Deleted

## 2013-08-19 ENCOUNTER — Encounter: Payer: Self-pay | Admitting: *Deleted

## 2013-08-19 DIAGNOSIS — Z7901 Long term (current) use of anticoagulants: Secondary | ICD-10-CM

## 2013-08-19 DIAGNOSIS — I82409 Acute embolism and thrombosis of unspecified deep veins of unspecified lower extremity: Secondary | ICD-10-CM

## 2013-09-02 ENCOUNTER — Ambulatory Visit (INDEPENDENT_AMBULATORY_CARE_PROVIDER_SITE_OTHER): Payer: Medicare Other | Admitting: Pharmacist

## 2013-09-02 DIAGNOSIS — Z7901 Long term (current) use of anticoagulants: Secondary | ICD-10-CM

## 2013-09-02 DIAGNOSIS — I82409 Acute embolism and thrombosis of unspecified deep veins of unspecified lower extremity: Secondary | ICD-10-CM

## 2013-09-11 ENCOUNTER — Encounter: Payer: Self-pay | Admitting: *Deleted

## 2013-09-16 ENCOUNTER — Ambulatory Visit (INDEPENDENT_AMBULATORY_CARE_PROVIDER_SITE_OTHER): Payer: Medicare Other | Admitting: Pharmacist

## 2013-09-16 DIAGNOSIS — I82409 Acute embolism and thrombosis of unspecified deep veins of unspecified lower extremity: Secondary | ICD-10-CM

## 2013-09-16 DIAGNOSIS — Z7901 Long term (current) use of anticoagulants: Secondary | ICD-10-CM

## 2013-09-18 ENCOUNTER — Ambulatory Visit (INDEPENDENT_AMBULATORY_CARE_PROVIDER_SITE_OTHER): Payer: Medicare Other | Admitting: Emergency Medicine

## 2013-09-18 ENCOUNTER — Telehealth: Payer: Self-pay | Admitting: *Deleted

## 2013-09-18 VITALS — BP 112/70 | HR 68 | Temp 98.0°F | Resp 16 | Ht 66.5 in | Wt 186.2 lb

## 2013-09-18 DIAGNOSIS — H9202 Otalgia, left ear: Secondary | ICD-10-CM

## 2013-09-18 DIAGNOSIS — H6123 Impacted cerumen, bilateral: Secondary | ICD-10-CM

## 2013-09-18 DIAGNOSIS — H6120 Impacted cerumen, unspecified ear: Secondary | ICD-10-CM

## 2013-09-18 DIAGNOSIS — H9209 Otalgia, unspecified ear: Secondary | ICD-10-CM

## 2013-09-18 NOTE — Patient Instructions (Addendum)
Cerumen Impaction A cerumen impaction is when the wax in your ear forms a plug. This plug usually causes reduced hearing. Sometimes it also causes an earache or dizziness. Removing a cerumen impaction can be difficult and painful. The wax sticks to the ear canal. The canal is sensitive and bleeds easily. If you try to remove a heavy wax buildup with a cotton tipped swab, you may push it in further. Irrigation with water, suction, and small ear curettes may be used to clear out the wax. If the impaction is fixed to the skin in the ear canal, ear drops may be needed for a few days to loosen the wax. People who build up a lot of wax frequently can use ear wax removal products available in your local drugstore. SEEK MEDICAL CARE IF:  You develop an earache, increased hearing loss, or marked dizziness. Document Released: 01/17/2005 Document Revised: 03/03/2012 Document Reviewed: 03/09/2010 [employer] Patient Information 2014 [employer].  

## 2013-09-18 NOTE — Telephone Encounter (Signed)
Nurse from the [facility] facility was informed that patient could go to the urgent care if pain persist,all office appointments was full for today. Have patient follow up with Dr. Chilton Si.

## 2013-09-18 NOTE — Progress Notes (Signed)
[facility] [address] [place] [address] [phone]  Date:  09/18/2013   Name:  Zachary Harris   DOB:  07/23/1923   MRN:  [id number]  PCP:  Kimber Relic, MD    Chief Complaint: Otitis Media   History of Present Illness:  Zachary Harris is a 79 y.o. very pleasant male patient who presents with the following:  Last night developed pain in left ear and mastoid.  Worse this morning.  No fever or chills.  No cough or coryza.  No history of injury.  No nausea or vomiting.  No neuro or visual symptoms.    Patient Active Problem List   Diagnosis Date Noted  . Other malaise and fatigue 08/16/2013  . Urinary tract infection, site not specified 08/16/2013  . Urine retention 08/16/2013  . Incomplete bladder emptying 08/16/2013  . Urge incontinence 08/16/2013  . Nocturia 08/16/2013  . Nodular prostate with urinary obstruction 08/16/2013  . Leukocytosis, unspecified 05/12/2013  . 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 Malignant neoplasm of prostate 02/25/2013  . DVT (deep venous thrombosis) 03/21/2011  . Long term current use of anticoagulant 03/21/2011  . HYPOTENSION, ORTHOSTATIC 03/06/2011  . SECONDARY DM W/PERIPHERAL CIRC D/O UNCONTROLLED 11/07/2010  . CAROTID ARTERY DISEASE 10/02/2010  . OSTEOPOROSIS 06/02/2010  . BACK PAIN 09/05/2009  . VERTEBRAL FRACTURE 09/05/2009  . Intermittent high-grade heart block 09/05/2009  . UNSPECIFIED HEARING LOSS 05/13/2009  . MEMORY LOSS 06/01/2008  . Pacemaker-St.Jude 02/02/2008  . HYPERLIPIDEMIA 11/14/2007  . ANXIETY 11/14/2007  . DEPRESSION 11/14/2007  . HYPERTENSION 11/14/2007  . MYOCARDIAL INFARCTION, HX OF 11/14/2007  . CORONARY ARTERY DISEASE 11/14/2007  . PERIPHERAL VASCULAR DISEASE 11/14/2007  . GERD 11/14/2007  . RENAL INSUFFICIENCY 11/14/2007  . BENIGN PROSTATIC HYPERTROPHY 11/14/2007  . ANEMIA-IRON DEFICIENCY 05/27/2007  .  COLONIC POLYPS, HX OF 05/27/2007    Past Medical History  Diagnosis Date  . ANEMIA-IRON DEFICIENCY 05/27/2007  . ANXIETY 11/14/2007  . Intermittent high-grade heart block 09/05/2009  . BENIGN PROSTATIC HYPERTROPHY 11/14/2007  . CAROTID ARTERY DISEASE 10/02/2010  . Pacemaker-St. Jude 02/02/2008    St. Jude  . COLONIC POLYPS, HX OF 05/27/2007  . CORONARY ARTERY DISEASE 11/14/2007    prior bypass surgery; ejection fraction 40-45% 2012  . DEPRESSION 11/14/2007  . DIABETES MELLITUS, TYPE II 11/14/2007  . DIVERTICULITIS, HX OF 05/27/2007  . DVT (deep venous thrombosis) 03/21/2011  . Edema 01/15/2011  . FOOT PAIN, BILATERAL 12/08/2010  . GERD 11/14/2007  . GROIN PAIN 09/24/2008  . HYPERLIPIDEMIA 11/14/2007  . HYPERTENSION 11/14/2007  . HYPOTENSION, ORTHOSTATIC 03/06/2011  . Long term current use of anticoagulant 03/21/2011  . Memory loss 06/01/2008  . OSTEOPOROSIS 06/02/2010  . PERIPHERAL VASCULAR DISEASE 11/14/2007  . RASH-NONVESICULAR 01/22/2008  . RENAL INSUFFICIENCY 11/14/2007  . SECONDARY DM W/PERIPHERAL CIRC D/O UNCONTROLLED 11/07/2010  . SYNCOPE 09/08/2008  . Unspecified hearing loss 05/13/2009  . VERTEBRAL FRACTURE 09/05/2009  . Prostate cancer   . Acute cystitis   . Myocardial infarction   . Seborrheic keratosis 06/05/2011  . Chronic pain 04/24/2011  . Unspecified hereditary and idiopathic peripheral neuropathy 04/24/2011  . Myalgia and myositis, unspecified 04/24/2011  . Other malaise and fatigue 04/24/2011  . Abnormality of gait 04/24/2011  . Type II or unspecified type diabetes mellitus with neurological manifestations, uncontrolled(250.62) 03/27/2011  . Urinary frequency 03/27/2011  . Unspecified disorder of kidney and ureter 03/23/2011  . Osteoarthrosis, unspecified whether generalized  or localized, unspecified site 03/23/2011  . Seborrheic keratosis 06/05/2011  . Chronic pain 04/24/2011  . Unspecified hereditary and idiopathic peripheral neuropathy 04/24/2011  . Malignant neoplasm of prostate  03/23/2011  . Conduction disorder, unspecified 03/23/2011  . Phlebitis and thrombophlebitis of other deep vessels of lower extremities 03/23/2011  . Osteoarthrosis, unspecified whether generalized or localized, unspecified site 03/23/2011  . Syncope and collapse 03/13/2011    Past Surgical History  Procedure Laterality Date  . S/p coronary stent x 1    . Cholecystectomy    . S/p bilat knee replacement  Rolene Arbour, MD  . Pacemaker insertion  2012    Graciela Husbands, MD  . Prostate needle biopsy    . Cystoscopy    . Appendectomy    . Coronary artery bypass graft  2007    History  Substance Use Topics  . Smoking status: Former Smoker    Quit date: 10/18/1983  . Smokeless tobacco: Never Used  . Alcohol Use: No    Family History  Problem Relation Age of Onset  . Diabetes Father     57  . Cancer Mother     No Known Allergies  Medication list has been reviewed and updated.  Current Outpatient Prescriptions on File Prior to Visit  Medication Sig Dispense Refill  . alendronate (FOSAMAX) 70 MG tablet       . atorvastatin (LIPITOR) 40 MG tablet Take 1 tablet (40 mg total) by mouth daily. Take 1 tablet once daily for cholesterol.  30 tablet  3  . fenofibrate 160 MG tablet Take 1 tablet (160 mg total) by mouth daily. Take 1 tablet daily for cholesterol each morning.  30 tablet  3  . furosemide (LASIX) 20 MG tablet Take 20 mg by mouth every other day. Take 1 tablet daily to control edema.      . nitroGLYCERIN (NITROSTAT) 0.4 MG SL tablet Place 0.4 mg under the tongue every 5 (five) minutes as needed for chest pain.      . tamsulosin (FLOMAX) 0.4 MG CAPS capsule Take one tablet daily for bladder      . warfarin (COUMADIN) 2.5 MG tablet TAKE AS DIRECTED BY COUMADIN CLINIC.  50 tablet  3   No current facility-administered medications on file prior to visit.    Review of Systems:  As per HPI, otherwise negative.    Physical Examination: Filed Vitals:   09/18/13 1111  BP: 112/70  Pulse:  68  Temp: 98 F (36.7 C)  Resp: 16   Filed Vitals:   09/18/13 1111  Height: 5' 6.5" (1.689 m)  Weight: 186 lb 3.2 oz (84.46 kg)   Body mass index is 29.61 kg/(m^2). Ideal Body Weight: Weight in (lb) to have BMI = 25: 156.9   GEN: WDWN, NAD, Non-toxic, Alert & Oriented x 3 HEENT: Atraumatic, Normocephalic.  Ears and Nose: No external deformity.  Bilateral cerumen impaction EXTR: No clubbing/cyanosis/edema NEURO: Normal gait.  PSYCH: Normally interactive. Conversant. Not depressed or anxious appearing.  Calm demeanor.    Assessment and Plan: Cerumen impaction Cleared with irrigation Left otalgia   Signed,  Phillips Odor, MD

## 2013-09-22 ENCOUNTER — Encounter: Payer: Self-pay | Admitting: Internal Medicine

## 2013-10-07 ENCOUNTER — Ambulatory Visit (INDEPENDENT_AMBULATORY_CARE_PROVIDER_SITE_OTHER): Payer: Medicare Other | Admitting: Pharmacist

## 2013-10-07 DIAGNOSIS — I82409 Acute embolism and thrombosis of unspecified deep veins of unspecified lower extremity: Secondary | ICD-10-CM

## 2013-10-07 DIAGNOSIS — Z7901 Long term (current) use of anticoagulants: Secondary | ICD-10-CM

## 2013-10-14 ENCOUNTER — Ambulatory Visit
Admission: RE | Admit: 2013-10-14 | Discharge: 2013-10-14 | Disposition: A | Discharge status: Home / Self Care | Payer: Medicare Other | Source: Ambulatory Visit | Attending: Internal Medicine | Admitting: Internal Medicine

## 2013-10-14 ENCOUNTER — Encounter: Payer: Self-pay | Admitting: Internal Medicine

## 2013-10-14 ENCOUNTER — Ambulatory Visit (INDEPENDENT_AMBULATORY_CARE_PROVIDER_SITE_OTHER): Payer: Medicare Other | Admitting: Internal Medicine

## 2013-10-14 VITALS — BP 140/76 | HR 67 | Temp 97.7°F | Wt 185.4 lb

## 2013-10-14 DIAGNOSIS — M549 Dorsalgia, unspecified: Secondary | ICD-10-CM

## 2013-10-14 DIAGNOSIS — C61 Malignant neoplasm of prostate: Secondary | ICD-10-CM

## 2013-10-14 DIAGNOSIS — I10 Essential (primary) hypertension: Secondary | ICD-10-CM

## 2013-10-14 MED ORDER — TRAMADOL HCL 50 MG PO TABS: 50.0000 mg | ORAL_TABLET | Freq: Three times a day (TID) | ORAL | Status: DC | PRN

## 2013-10-14 NOTE — Progress Notes (Signed)
Subjective:    Patient ID: Zachary Harris, male    DOB: Apr 23, 1923, 79 y.o.   MRN: [id number]  Chief Complaint  Patient presents with  . Acute Visit    low back pain x 4 days    HPI Pin in the low back and in the left SI joint. Pains started about 07/10/13. No falls or injury. Limping when he starts to walk. He thought it might be related to prostate cancet. Dr. Annabell Howells said to call me. Had NM bone scan Feb 2014 that suggested degenerative changes at L5.  Seen at Urgent Care for cerumen impaction in late Sept 2014  Current Outpatient Prescriptions on File Prior to Visit  Medication Sig Dispense Refill  . alendronate (FOSAMAX) 70 MG tablet       . atorvastatin (LIPITOR) 40 MG tablet Take 1 tablet (40 mg total) by mouth daily. Take 1 tablet once daily for cholesterol.  30 tablet  3  . fenofibrate 160 MG tablet Take 1 tablet (160 mg total) by mouth daily. Take 1 tablet daily for cholesterol each morning.  30 tablet  3  . furosemide (LASIX) 20 MG tablet Take 20 mg by mouth every other day. Take 1 tablet daily to control edema.      . nitroGLYCERIN (NITROSTAT) 0.4 MG SL tablet Place 0.4 mg under the tongue every 5 (five) minutes as needed for chest pain.      . tamsulosin (FLOMAX) 0.4 MG CAPS capsule Take one tablet daily for bladder      . warfarin (COUMADIN) 2.5 MG tablet TAKE AS DIRECTED BY COUMADIN CLINIC.  50 tablet  3   No current facility-administered medications on file prior to visit.    Review of Systems  Constitutional: Positive for unexpected weight change. Negative for fever, chills, activity change, appetite change and fatigue.  HENT: Positive for hearing loss. Negative for ear pain and sinus pressure.   Eyes: Negative for photophobia, pain and visual disturbance.       Corrective lenses  Respiratory: Positive for shortness of breath. Negative for cough, chest tightness and wheezing.   Cardiovascular: Positive for leg swelling.       Pacemaker. History of syncope related to  cardiac arrhythmia.  Gastrointestinal: Negative for abdominal pain and abdominal distention.  Endocrine: Negative for cold intolerance, heat intolerance, polydipsia, polyphagia and polyuria.       History of diabetes.  Genitourinary: Positive for urgency and frequency. Negative for dysuria and flank pain.       Urge incontinence. Increased frequency which has not responded well to Toviaz. Continues to see urologist.  Musculoskeletal: Positive for arthralgias, back pain and gait problem.       Poor balance and gait disturbance.  Skin:       Rough area on left cheek  Allergic/Immunologic: Negative.   Neurological: Negative for tremors, seizures, facial asymmetry and weakness.  Hematological: Negative.   Psychiatric/Behavioral: Positive for confusion.       Mild to moderate memory disturbance.       Objective:   Physical Exam  Constitutional: He is oriented to person, place, and time.  Frail  HENT:  Head: Normocephalic and atraumatic.  Nose: Nose normal.  Bilateral hearing aids.  Eyes: Conjunctivae are normal. Pupils are equal, round, and reactive to light.  Prescription lenses.  Neck: Normal range of motion. Neck supple. No JVD present. No tracheal deviation present. No thyromegaly present.  Cardiovascular: Normal rate and regular rhythm.  Exam reveals no gallop and no friction  rub.   Murmur heard.  Systolic murmur is present with a grade of 1/6  Pulmonary/Chest: Effort normal and breath sounds normal. No respiratory distress. He has no wheezes. He has no rales. He exhibits no tenderness.  Abdominal: Soft. Bowel sounds are normal. He exhibits no distension and no mass. There is no tenderness.  Musculoskeletal: Normal range of motion. He exhibits edema. He exhibits no tenderness.  Pain in the left SI joint area. Limps when walking. Favors the left side  Lymphadenopathy:    He has no cervical adenopathy.  Neurological: He is alert and oriented to person, place, and time. He has  normal strength. A sensory deficit is present. No cranial nerve deficit. Gait abnormal.  Skin: Skin is warm and dry. No erythema. No pallor.  Rough area on the left cheek  Psychiatric: He has a normal mood and affect. His behavior is normal. Thought content normal.  Patient is in denial regarding the extent of his memory deficit.          Assessment & Plan:  1. Malignant neoplasm of prostate  Back pain unlikely to be related  2. BACK PAIN - DG Lumbar Spine Complete; Future - traMADol (ULTRAM) 50 MG tablet; Take 1 tablet (50 mg total) by mouth every 8 (eight) hours as needed for pain.  Dispense: 50 tablet; Refill: 0  3. HYPERTENSION: controlled

## 2013-10-14 NOTE — Patient Instructions (Signed)
Try tramadol for pain. Get x-rays of back.

## 2013-10-15 ENCOUNTER — Ambulatory Visit (INDEPENDENT_AMBULATORY_CARE_PROVIDER_SITE_OTHER): Payer: Medicare Other | Admitting: Internal Medicine

## 2013-10-15 ENCOUNTER — Encounter: Payer: Self-pay | Admitting: Internal Medicine

## 2013-10-15 VITALS — BP 112/69 | HR 71 | Ht 66.5 in | Wt 185.4 lb

## 2013-10-15 DIAGNOSIS — I951 Orthostatic hypotension: Secondary | ICD-10-CM

## 2013-10-15 DIAGNOSIS — Z950 Presence of cardiac pacemaker: Secondary | ICD-10-CM

## 2013-10-15 DIAGNOSIS — I2510 Atherosclerotic heart disease of native coronary artery without angina pectoris: Secondary | ICD-10-CM

## 2013-10-15 DIAGNOSIS — I441 Atrioventricular block, second degree: Secondary | ICD-10-CM

## 2013-10-15 LAB — PACEMAKER DEVICE OBSERVATION
AL AMPLITUDE: 4 mv
AL IMPEDENCE PM: 475 Ohm
BAMS-0001: 180 {beats}/min
BAMS-0003: 70 {beats}/min
BATTERY VOLTAGE: 2.9629 V
DEVICE MODEL PM: 7228315
RV LEAD AMPLITUDE: 12 mv
RV LEAD IMPEDENCE PM: 600 Ohm

## 2013-10-15 NOTE — Assessment & Plan Note (Signed)
Denies symptoms except just upon standing  Not problem he doesn't thinik

## 2013-10-15 NOTE — Progress Notes (Signed)
Patient Care Team: Kimber Relic, MD as PCP - General (Internal Medicine) Bjorn Pippin, MD as Consulting Physician (Urology) Gaylord Shih, MD as Consulting Physician (Cardiology) [facility] Lewayne Bunting, MD as Attending Physician (Cardiology)   HPI  Zachary Harris is a 79 y.o. male Seen in followup for pacemaker implanted for high-grade heart block and recurrent syncope. He also had orthostatic hypotension potentially contributing to his syncope, this potentially associated with diabetic autonomic neuropathy  His history of ischemic heart disease with prior bypass grafting and depressed left ventricular function with an EF of 40-45%. He also has hypertension       Past Medical History  Diagnosis Date  . ANEMIA-IRON DEFICIENCY 05/27/2007  . ANXIETY 11/14/2007  . Intermittent high-grade heart block 09/05/2009  . BENIGN PROSTATIC HYPERTROPHY 11/14/2007  . CAROTID ARTERY DISEASE 10/02/2010  . Pacemaker-St. Jude 02/02/2008    St. Jude  . COLONIC POLYPS, HX OF 05/27/2007  . CORONARY ARTERY DISEASE 11/14/2007    prior bypass surgery; ejection fraction 40-45% 2012  . DEPRESSION 11/14/2007  . DIABETES MELLITUS, TYPE II 11/14/2007  . DIVERTICULITIS, HX OF 05/27/2007  . DVT (deep venous thrombosis) 03/21/2011  . Edema 01/15/2011  . FOOT PAIN, BILATERAL 12/08/2010  . GERD 11/14/2007  . GROIN PAIN 09/24/2008  . HYPERLIPIDEMIA 11/14/2007  . HYPERTENSION 11/14/2007  . HYPOTENSION, ORTHOSTATIC 03/06/2011  . Long term current use of anticoagulant 03/21/2011  . Memory loss 06/01/2008  . OSTEOPOROSIS 06/02/2010  . PERIPHERAL VASCULAR DISEASE 11/14/2007  . RASH-NONVESICULAR 01/22/2008  . RENAL INSUFFICIENCY 11/14/2007  . SECONDARY DM W/PERIPHERAL CIRC D/O UNCONTROLLED 11/07/2010  . SYNCOPE 09/08/2008  . Unspecified hearing loss 05/13/2009  . VERTEBRAL FRACTURE 09/05/2009  . Prostate cancer   . Acute cystitis   . Myocardial infarction   . Seborrheic keratosis 06/05/2011  . Chronic  pain 04/24/2011  . Unspecified hereditary and idiopathic peripheral neuropathy 04/24/2011  . Myalgia and myositis, unspecified 04/24/2011  . Other malaise and fatigue 04/24/2011  . Abnormality of gait 04/24/2011  . Type II or unspecified type diabetes mellitus with neurological manifestations, uncontrolled(250.62) 03/27/2011  . Urinary frequency 03/27/2011  . Unspecified disorder of kidney and ureter 03/23/2011  . Osteoarthrosis, unspecified whether generalized or localized, unspecified site 03/23/2011  . Seborrheic keratosis 06/05/2011  . Chronic pain 04/24/2011  . Unspecified hereditary and idiopathic peripheral neuropathy 04/24/2011  . Malignant neoplasm of prostate 03/23/2011  . Conduction disorder, unspecified 03/23/2011  . Phlebitis and thrombophlebitis of other deep vessels of lower extremities 03/23/2011  . Osteoarthrosis, unspecified whether generalized or localized, unspecified site 03/23/2011  . Syncope and collapse 03/13/2011    Past Surgical History  Procedure Laterality Date  . S/p coronary stent x 1    . Cholecystectomy    . S/p bilat knee replacement  Rolene Arbour, MD  . Pacemaker insertion  2012    Graciela Husbands, MD  . Prostate needle biopsy    . Cystoscopy    . Appendectomy    . Coronary artery bypass graft  2007    Current Outpatient Prescriptions  Medication Sig Dispense Refill  . alendronate (FOSAMAX) 70 MG tablet 70 mg. Pt unsure of dosage      . atorvastatin (LIPITOR) 40 MG tablet Take 1 tablet (40 mg total) by mouth daily. Take 1 tablet once daily for cholesterol.  30 tablet  3  . fenofibrate 160 MG tablet Take 1 tablet daily for cholesterol each morning.      Marland Kitchen  furosemide (LASIX) 20 MG tablet Take 20 mg by mouth every other day. Take 1 tablet daily to control edema.      . nitroGLYCERIN (NITROSTAT) 0.4 MG SL tablet Place 0.4 mg under the tongue every 5 (five) minutes as needed for chest pain.      . tamsulosin (FLOMAX) 0.4 MG CAPS capsule Take one tablet daily for bladder      .  warfarin (COUMADIN) 2.5 MG tablet TAKE AS DIRECTED BY COUMADIN CLINIC.  50 tablet  3  . traMADol (ULTRAM) 50 MG tablet Take 1 tablet (50 mg total) by mouth every 8 (eight) hours as needed for pain.  50 tablet  0   No current facility-administered medications for this visit.    No Known Allergies  Review of Systems negative except from HPI and PMH  Physical Exam Pulse 71  Ht 5' 6.5" (1.689 m)  Wt 185 lb 6.4 oz (84.097 kg)  BMI 29.48 kg/m2 Well developed and well nourished in no acute distress HENT normal E scleral and icterus clear Neck Supple JVP flat; carotids brisk and full Clear to ausculation  Device pocket well healed; without hematoma or erythema.  There is no tethering Regular rate and rhythm, no murmurs gallops or rub Soft with active bowel sounds No clubbing cyanosis none Edema Alert and oriented, grossly normal motor and sensory function Skin Warm and Dry    Assessment and  Plan

## 2013-10-15 NOTE — Assessment & Plan Note (Signed)
The patient's device was interrogated.  The information was reviewed. No changes were made in the programming.    

## 2013-10-15 NOTE — Assessment & Plan Note (Signed)
stble   On statin and warfarin and hence not asa

## 2013-10-15 NOTE — Patient Instructions (Signed)
Remote monitoring is used to monitor your Pacemaker of ICD from home. This monitoring reduces the number of office visits required to check your device to one time per year. It allows [place] to keep an eye on the functioning of your device to ensure it is working properly. You are scheduled for a device check from home on 01/18/2014. You may send your transmission at any time that day. If you have a wireless device, the transmission will be sent automatically. After your physician reviews your transmission, you will receive a postcard with your next transmission date.  Your physician wants you to follow-up in: 6 months with Dr. Jens Som. You will receive a reminder letter in the mail two months in advance. If you don't receive a letter, please call our office to schedule the follow-up appointment.  Your physician wants you to follow-up in: one year with Dr. Graciela Husbands.  You will receive a reminder letter in the mail two months in advance. If you don't receive a letter, please call our office to schedule the follow-up appointment.

## 2013-10-15 NOTE — Assessment & Plan Note (Signed)
Stable post pacing 

## 2013-10-19 ENCOUNTER — Other Ambulatory Visit: Payer: Self-pay | Admitting: *Deleted

## 2013-10-19 MED ORDER — WARFARIN SODIUM 2.5 MG PO TABS: 2.5000 mg | ORAL_TABLET | ORAL | Status: DC

## 2013-10-20 ENCOUNTER — Encounter: Payer: Self-pay | Admitting: Internal Medicine

## 2013-10-20 ENCOUNTER — Non-Acute Institutional Stay: Payer: Medicare Other | Admitting: Internal Medicine

## 2013-10-20 VITALS — BP 120/64 | HR 64 | Ht 66.5 in | Wt 184.0 lb

## 2013-10-20 DIAGNOSIS — M549 Dorsalgia, unspecified: Secondary | ICD-10-CM

## 2013-10-20 MED ORDER — METHOCARBAMOL 500 MG PO TABS: ORAL_TABLET | ORAL | Status: DC

## 2013-10-20 NOTE — Progress Notes (Signed)
Subjective:    Patient ID: Zachary Harris, male    DOB: August 04, 1923, 79 y.o.   MRN: [id number]  Chief Complaint  Patient presents with  . Medical Managment of Chronic Issues    back pain, re-evaluate after starting Tramadol. Still having trouble walking    HPI Complains of low back pain. He never filled the prescription for tramadol. Xrays showed chronic changes. Old vertebral fx at multiple levels. Chronic degenerative changes. No radiation of the pain in to the legs.  Current Outpatient Prescriptions on File Prior to Visit  Medication Sig Dispense Refill  . alendronate (FOSAMAX) 70 MG tablet 70 mg. Pt unsure of dosage      . atorvastatin (LIPITOR) 40 MG tablet Take 1 tablet (40 mg total) by mouth daily. Take 1 tablet once daily for cholesterol.  30 tablet  3  . fenofibrate 160 MG tablet Take 1 tablet daily for cholesterol each morning.      . furosemide (LASIX) 20 MG tablet Take 20 mg by mouth every other day. Take 1 tablet daily to control edema.      . nitroGLYCERIN (NITROSTAT) 0.4 MG SL tablet Place 0.4 mg under the tongue every 5 (five) minutes as needed for chest pain.      . tamsulosin (FLOMAX) 0.4 MG CAPS capsule Take one tablet daily for bladder      . traMADol (ULTRAM) 50 MG tablet Take 1 tablet (50 mg total) by mouth every 8 (eight) hours as needed for pain.  50 tablet  0  . warfarin (COUMADIN) 2.5 MG tablet Take 1 tablet (2.5 mg total) by mouth as directed.  65 tablet  3   No current facility-administered medications on file prior to visit.    Review of Systems  Constitutional: Positive for unexpected weight change. Negative for fever, chills, activity change, appetite change and fatigue.  HENT: Positive for hearing loss. Negative for ear pain and sinus pressure.   Eyes: Negative for photophobia, pain and visual disturbance.       Corrective lenses  Respiratory: Positive for shortness of breath. Negative for cough, chest tightness and wheezing.   Cardiovascular: Positive  for leg swelling.       Pacemaker. History of syncope related to cardiac arrhythmia.  Gastrointestinal: Negative for abdominal pain and abdominal distention.  Endocrine: Negative for cold intolerance, heat intolerance, polydipsia, polyphagia and polyuria.       History of diabetes.  Genitourinary: Positive for urgency and frequency. Negative for dysuria and flank pain.       Urge incontinence. Increased frequency which has not responded well to Toviaz. Continues to see urologist.  Musculoskeletal: Positive for arthralgias, back pain and gait problem.       Poor balance and gait disturbance.  Skin:       Rough area on left cheek  Allergic/Immunologic: Negative.   Neurological: Negative for tremors, seizures, facial asymmetry and weakness.  Hematological: Negative.   Psychiatric/Behavioral: Positive for confusion.       Mild to moderate memory disturbance.       Objective:   Physical Exam  Constitutional: He is oriented to person, place, and time.  Frail  HENT:  Head: Normocephalic and atraumatic.  Nose: Nose normal.  Bilateral hearing aids.  Eyes: Conjunctivae are normal. Pupils are equal, round, and reactive to light.  Prescription lenses.  Neck: Normal range of motion. Neck supple. No JVD present. No tracheal deviation present. No thyromegaly present.  Cardiovascular: Normal rate and regular rhythm.  Exam reveals no  gallop and no friction rub.   Murmur heard.  Systolic murmur is present with a grade of 1/6  Pulmonary/Chest: Effort normal and breath sounds normal. No respiratory distress. He has no wheezes. He has no rales. He exhibits no tenderness.  Abdominal: Soft. Bowel sounds are normal. He exhibits no distension and no mass. There is no tenderness.  Musculoskeletal: Normal range of motion. He exhibits edema. He exhibits no tenderness.  Pain in the left SI joint area. Limps when walking. Favors the left side  Lymphadenopathy:    He has no cervical adenopathy.   Neurological: He is alert and oriented to person, place, and time. He has normal strength. A sensory deficit is present. No cranial nerve deficit. Gait abnormal.  Skin: Skin is warm and dry. No erythema. No pallor.  Rough area on the left cheek  Psychiatric: He has a normal mood and affect. His behavior is normal. Thought content normal.  Patient is in denial regarding the extent of his memory deficit.          Assessment & Plan:  BACK PAIN -   Plan: methocarbamol (ROBAXIN) 500 MG tablet tid   Tramadol 50 mg q6-8 h prn pain

## 2013-10-20 NOTE — Patient Instructions (Signed)
Use medication for back pain as directed

## 2013-10-21 ENCOUNTER — Ambulatory Visit (INDEPENDENT_AMBULATORY_CARE_PROVIDER_SITE_OTHER): Payer: Medicare Other | Admitting: *Deleted

## 2013-10-21 DIAGNOSIS — I82409 Acute embolism and thrombosis of unspecified deep veins of unspecified lower extremity: Secondary | ICD-10-CM

## 2013-10-21 DIAGNOSIS — Z7901 Long term (current) use of anticoagulants: Secondary | ICD-10-CM

## 2013-10-21 LAB — POCT INR: INR: 2.9

## 2013-11-02 ENCOUNTER — Encounter: Payer: Self-pay | Admitting: Internal Medicine

## 2013-11-05 ENCOUNTER — Ambulatory Visit (INDEPENDENT_AMBULATORY_CARE_PROVIDER_SITE_OTHER): Payer: Medicare Other | Admitting: Pharmacist

## 2013-11-05 ENCOUNTER — Encounter (INDEPENDENT_AMBULATORY_CARE_PROVIDER_SITE_OTHER): Payer: Self-pay

## 2013-11-05 DIAGNOSIS — Z7901 Long term (current) use of anticoagulants: Secondary | ICD-10-CM

## 2013-11-05 DIAGNOSIS — I82409 Acute embolism and thrombosis of unspecified deep veins of unspecified lower extremity: Secondary | ICD-10-CM

## 2013-11-20 ENCOUNTER — Other Ambulatory Visit: Payer: Self-pay | Admitting: Nurse Practitioner

## 2013-11-23 LAB — LIPID PANEL
Cholesterol: 136 mg/dL (ref 0–200)
LDL Cholesterol: 62 mg/dL
Triglycerides: 223 mg/dL — AB (ref 40–160)

## 2013-11-23 LAB — BASIC METABOLIC PANEL WITH GFR
BUN: 36 mg/dL — AB (ref 4–21)
Creatinine: 1.2 mg/dL (ref 0.6–1.3)
Glucose: 155 mg/dL
Potassium: 4.2 mmol/L (ref 3.4–5.3)
Sodium: 139 mmol/L (ref 137–147)

## 2013-11-23 LAB — CBC AND DIFFERENTIAL
HCT: 38 % — AB (ref 41–53)
Neutrophils Absolute: 5 /uL
Platelets: 170 10*3/uL (ref 150–399)

## 2013-11-30 ENCOUNTER — Ambulatory Visit (HOSPITAL_COMMUNITY): Payer: Medicare Other | Attending: Internal Medicine

## 2013-11-30 DIAGNOSIS — I6529 Occlusion and stenosis of unspecified carotid artery: Secondary | ICD-10-CM

## 2013-12-01 ENCOUNTER — Non-Acute Institutional Stay: Payer: Medicare Other | Admitting: Internal Medicine

## 2013-12-01 ENCOUNTER — Encounter: Payer: Self-pay | Admitting: Internal Medicine

## 2013-12-01 VITALS — BP 132/72 | HR 68 | Wt 189.0 lb

## 2013-12-01 DIAGNOSIS — E785 Hyperlipidemia, unspecified: Secondary | ICD-10-CM

## 2013-12-01 DIAGNOSIS — N3941 Urge incontinence: Secondary | ICD-10-CM

## 2013-12-01 DIAGNOSIS — R413 Other amnesia: Secondary | ICD-10-CM

## 2013-12-01 DIAGNOSIS — D509 Iron deficiency anemia, unspecified: Secondary | ICD-10-CM

## 2013-12-01 DIAGNOSIS — N400 Enlarged prostate without lower urinary tract symptoms: Secondary | ICD-10-CM

## 2013-12-01 DIAGNOSIS — E1149 Type 2 diabetes mellitus with other diabetic neurological complication: Secondary | ICD-10-CM

## 2013-12-01 DIAGNOSIS — I2510 Atherosclerotic heart disease of native coronary artery without angina pectoris: Secondary | ICD-10-CM

## 2013-12-01 DIAGNOSIS — I82409 Acute embolism and thrombosis of unspecified deep veins of unspecified lower extremity: Secondary | ICD-10-CM

## 2013-12-01 DIAGNOSIS — Z7901 Long term (current) use of anticoagulants: Secondary | ICD-10-CM

## 2013-12-01 DIAGNOSIS — M549 Dorsalgia, unspecified: Secondary | ICD-10-CM

## 2013-12-01 DIAGNOSIS — I10 Essential (primary) hypertension: Secondary | ICD-10-CM

## 2013-12-01 MED ORDER — WARFARIN SODIUM 2.5 MG PO TABS: ORAL_TABLET | ORAL | Status: DC

## 2013-12-01 MED ORDER — SOLIFENACIN SUCCINATE 10 MG PO TABS: ORAL_TABLET | ORAL | Status: DC

## 2013-12-01 NOTE — Progress Notes (Signed)
Subjective:    Patient ID: Zachary Harris, male    DOB: April 16, 1923, 79 y.o.   MRN: [id number]  Chief Complaint  Patient presents with  . Medical Managment of Chronic Issues    blood pressure, blood sugar, cholesterol, anemia    HPI HYPERTENSION: controlled  CORONARY ARTERY DISEASE; asymptomatic  Urgency incontinence: voiding every hour or two. Urge incontinence. Known BPH  Type II or unspecified type diabetes mellitus with neurological manifestations, uncontrolled(250.62): unchanged  BENIGN PROSTATIC HYPERTROPHY: unchanged  ANEMIA-IRON DEFICIENCY: improved  HYPERLIPIDEMIA: compensated  Memory loss: unchanged  Long term current use of anticoagulant: for hx DVT  BACK PAIN: Ok when he uses robaxin or tramadol  DVT (deep venous thrombosis), unspecified laterality - Plan: warfarin (COUMADIN) 2.5 MG tablet    Current Outpatient Prescriptions on File Prior to Visit  Medication Sig Dispense Refill  . alendronate (FOSAMAX) 70 MG tablet 70 mg. Pt unsure of dosage      . atorvastatin (LIPITOR) 40 MG tablet Take 1 tablet (40 mg total) by mouth daily. Take 1 tablet once daily for cholesterol.  30 tablet  3  . fenofibrate 160 MG tablet TAKE 1 TABLET ONCE A DAY TO CONTROL CHOLESTEROL.  30 tablet  0  . furosemide (LASIX) 20 MG tablet Take 20 mg by mouth every other day. Take 1 tablet daily to control edema.      . methocarbamol (ROBAXIN) 500 MG tablet One every 8 hours to relax muscles  50 tablet  1  . nitroGLYCERIN (NITROSTAT) 0.4 MG SL tablet Place 0.4 mg under the tongue every 5 (five) minutes as needed for chest pain.      . tamsulosin (FLOMAX) 0.4 MG CAPS capsule Take one tablet daily for bladder      . traMADol (ULTRAM) 50 MG tablet Take 1 tablet (50 mg total) by mouth every 8 (eight) hours as needed for pain.  50 tablet  0  . warfarin (COUMADIN) 2.5 MG tablet Take 1 tablet (2.5 mg total) by mouth as directed.  65 tablet  3   No current facility-administered medications on file  prior to visit.    Review of Systems  Constitutional: Negative for fever, chills, activity change, appetite change, fatigue and unexpected weight change.  HENT: Positive for hearing loss. Negative for ear pain and sinus pressure.   Eyes: Negative for photophobia, pain and visual disturbance.       Corrective lenses  Respiratory: Positive for shortness of breath. Negative for cough, chest tightness and wheezing.   Cardiovascular: Positive for leg swelling.       Pacemaker. History of syncope related to cardiac arrhythmia.  Gastrointestinal: Negative for abdominal pain and abdominal distention.  Endocrine: Negative for cold intolerance, heat intolerance, polydipsia, polyphagia and polyuria.       History of diabetes.  Genitourinary: Positive for urgency and frequency. Negative for dysuria and flank pain.       Urge incontinence. Increased frequency which has not responded well to Toviaz. Continues to see urologist.  Musculoskeletal: Positive for arthralgias, back pain and gait problem.       Poor balance and gait disturbance.  Skin:       Rough area on left cheek  Allergic/Immunologic: Negative.   Neurological: Negative for tremors, seizures, facial asymmetry and weakness.  Hematological: Negative.   Psychiatric/Behavioral: Positive for confusion.       Mild to moderate memory disturbance.       Objective:BP 132/72  Pulse 68  Wt 189 lb (85.73 kg)  Physical Exam  Constitutional: He is oriented to person, place, and time.  Frail  HENT:  Head: Normocephalic and atraumatic.  Nose: Nose normal.  Bilateral hearing aids.  Eyes: Conjunctivae are normal. Pupils are equal, round, and reactive to light.  Prescription lenses.  Neck: Normal range of motion. Neck supple. No JVD present. No tracheal deviation present. No thyromegaly present.  Cardiovascular: Normal rate and regular rhythm.  Exam reveals no gallop and no friction rub.   Murmur heard.  Systolic murmur is present with a  grade of 1/6  Pulmonary/Chest: Effort normal and breath sounds normal. No respiratory distress. He has no wheezes. He has no rales. He exhibits no tenderness.  Abdominal: Soft. Bowel sounds are normal. He exhibits no distension and no mass. There is no tenderness.  Musculoskeletal: Normal range of motion. He exhibits edema. He exhibits no tenderness.  Pain in the left SI joint area. Limps when walking. Favors the left side  Lymphadenopathy:    He has no cervical adenopathy.  Neurological: He is alert and oriented to person, place, and time. He has normal strength. A sensory deficit is present. No cranial nerve deficit. Gait abnormal.  Skin: Skin is warm and dry. No erythema. No pallor.  Rough area on the left cheek  Psychiatric: He has a normal mood and affect. His behavior is normal. Thought content normal.  Patient is in denial regarding the extent of his memory deficit.      Nursing Home on 12/01/2013  Component Date Value Range Status  . Hemoglobin 11/23/2013 13.0* 13.5 - 17.5 g/dL Final  . HCT 16/09/9603 38* 41 - 53 % Final  . Neutrophils Absolute 11/23/2013 5   Final  . Platelets 11/23/2013 170  150 - 399 K/L Final  . Glucose 11/23/2013 155   Final  . BUN 11/23/2013 36* 4 - 21 mg/dL Final  . Creatinine 54/08/8118 1.2  0.6 - 1.3 mg/dL Final  . Potassium 14/78/2956 4.2  3.4 - 5.3 mmol/L Final  . Sodium 11/23/2013 139  137 - 147 mmol/L Final  . Triglycerides 11/23/2013 223* 40 - 160 mg/dL Final  . Cholesterol 21/30/8657 136  0 - 200 mg/dL Final  . HDL 84/69/6295 29* 35 - 70 mg/dL Final  . LDL Cholesterol 11/23/2013 62   Final  Anti-coag visit on 11/05/2013  Component Date Value Range Status  . INR 11/05/2013 2.2   Final       Assessment & Plan:  1. HYPERTENSION contrlled  2. CORONARY ARTERY DISEASE asymptomatic  3. Urgency incontinence - solifenacin (VESICARE) 10 MG tablet; One daily to decrease urinary urgrency  Dispense: 30 tablet; Refill: 5  4. Type II or  unspecified type diabetes mellitus with neurological manifestations, uncontrolled(250.62) unchanged  5. BENIGN PROSTATIC HYPERTROPHY uunchanged  6. ANEMIA-IRON DEFICIENCY improved  7. HYPERLIPIDEMIA improved  8. Memory loss unchanged  9. Long term current use of anticoagulant Continue for now. It has been at least 3 years since his DVT.  10. BACK PAIN Controlled on medication  11. DVT (deep venous thrombosis), unspecified laterality - warfarin (COUMADIN) 2.5 MG tablet; 2 tablets daily for anticoagulation  Dispense: 65 tablet; Refill: 3

## 2013-12-01 NOTE — Patient Instructions (Signed)
Added Vesicare for your urgency.

## 2013-12-04 ENCOUNTER — Ambulatory Visit (INDEPENDENT_AMBULATORY_CARE_PROVIDER_SITE_OTHER): Payer: Medicare Other | Admitting: General Practice

## 2013-12-04 DIAGNOSIS — Z7901 Long term (current) use of anticoagulants: Secondary | ICD-10-CM

## 2013-12-04 DIAGNOSIS — I82409 Acute embolism and thrombosis of unspecified deep veins of unspecified lower extremity: Secondary | ICD-10-CM

## 2013-12-04 LAB — POCT INR: INR: 2.8

## 2013-12-25 ENCOUNTER — Other Ambulatory Visit: Payer: Self-pay | Admitting: Internal Medicine

## 2014-01-01 ENCOUNTER — Ambulatory Visit (INDEPENDENT_AMBULATORY_CARE_PROVIDER_SITE_OTHER): Payer: Medicare Other

## 2014-01-01 DIAGNOSIS — I82409 Acute embolism and thrombosis of unspecified deep veins of unspecified lower extremity: Secondary | ICD-10-CM

## 2014-01-01 DIAGNOSIS — Z7901 Long term (current) use of anticoagulants: Secondary | ICD-10-CM

## 2014-01-01 LAB — POCT INR: INR: 2.4

## 2014-01-18 ENCOUNTER — Ambulatory Visit (INDEPENDENT_AMBULATORY_CARE_PROVIDER_SITE_OTHER): Payer: Medicare Other | Admitting: *Deleted

## 2014-01-18 ENCOUNTER — Encounter: Payer: Self-pay | Admitting: Internal Medicine

## 2014-01-18 DIAGNOSIS — I441 Atrioventricular block, second degree: Secondary | ICD-10-CM

## 2014-01-18 LAB — MDC_IDC_ENUM_SESS_TYPE_REMOTE
Battery Remaining Longevity: 107 mo
Battery Voltage: 2.96 V
Brady Statistic AP VP Percent: 1.4 %
Brady Statistic AS VP Percent: 13 %
Brady Statistic AS VS Percent: 80 %
Brady Statistic RA Percent Paced: 2.9 %
Lead Channel Impedance Value: 450 Ohm
Lead Channel Pacing Threshold Amplitude: 0.5 V
Lead Channel Pacing Threshold Amplitude: 0.75 V
Lead Channel Pacing Threshold Pulse Width: 0.4 ms
Lead Channel Sensing Intrinsic Amplitude: 12 mV
Lead Channel Sensing Intrinsic Amplitude: 3.2 mV
Lead Channel Setting Pacing Amplitude: 2 V
Lead Channel Setting Pacing Amplitude: 2.5 V
Lead Channel Setting Sensing Sensitivity: 2 mV
MDC IDC MSMT LEADCHNL RV IMPEDANCE VALUE: 600 Ohm
MDC IDC MSMT LEADCHNL RV PACING THRESHOLD PULSEWIDTH: 0.4 ms
MDC IDC PG SERIAL: 7228315
MDC IDC SESS DTM: 20150126080647
MDC IDC SET LEADCHNL RV PACING PULSEWIDTH: 0.4 ms
MDC IDC STAT BRADY AP VS PERCENT: 3.7 %
MDC IDC STAT BRADY RV PERCENT PACED: 14 %

## 2014-01-23 ENCOUNTER — Other Ambulatory Visit: Payer: Self-pay | Admitting: Internal Medicine

## 2014-01-23 ENCOUNTER — Other Ambulatory Visit: Payer: Self-pay | Admitting: Cardiology

## 2014-02-02 ENCOUNTER — Encounter: Payer: Self-pay | Admitting: *Deleted

## 2014-02-15 ENCOUNTER — Ambulatory Visit (INDEPENDENT_AMBULATORY_CARE_PROVIDER_SITE_OTHER): Payer: Medicare Other | Admitting: Pharmacist

## 2014-02-15 DIAGNOSIS — Z7901 Long term (current) use of anticoagulants: Secondary | ICD-10-CM

## 2014-02-15 DIAGNOSIS — I82409 Acute embolism and thrombosis of unspecified deep veins of unspecified lower extremity: Secondary | ICD-10-CM

## 2014-02-15 LAB — POCT INR: INR: 2.9

## 2014-02-27 ENCOUNTER — Other Ambulatory Visit: Payer: Self-pay | Admitting: Cardiology

## 2014-03-01 LAB — LIPID PANEL
CHOLESTEROL: 163 mg/dL (ref 0–200)
HDL: 26 mg/dL — AB (ref 35–70)
LDL Cholesterol: 93 mg/dL
TRIGLYCERIDES: 221 mg/dL — AB (ref 40–160)

## 2014-03-01 LAB — BASIC METABOLIC PANEL
BUN: 34 mg/dL — AB (ref 4–21)
Creatinine: 1.4 mg/dL — AB (ref 0.6–1.3)
Glucose: 179 mg/dL
POTASSIUM: 4.4 mmol/L (ref 3.4–5.3)
SODIUM: 136 mmol/L — AB (ref 137–147)

## 2014-03-02 ENCOUNTER — Non-Acute Institutional Stay: Payer: Medicare Other | Admitting: Internal Medicine

## 2014-03-02 ENCOUNTER — Encounter: Payer: Self-pay | Admitting: Internal Medicine

## 2014-03-02 VITALS — BP 100/62 | HR 18 | Ht 66.5 in | Wt 184.0 lb

## 2014-03-02 DIAGNOSIS — I739 Peripheral vascular disease, unspecified: Secondary | ICD-10-CM

## 2014-03-02 DIAGNOSIS — I2510 Atherosclerotic heart disease of native coronary artery without angina pectoris: Secondary | ICD-10-CM

## 2014-03-02 DIAGNOSIS — R413 Other amnesia: Secondary | ICD-10-CM

## 2014-03-02 DIAGNOSIS — I10 Essential (primary) hypertension: Secondary | ICD-10-CM

## 2014-03-02 DIAGNOSIS — R339 Retention of urine, unspecified: Secondary | ICD-10-CM

## 2014-03-02 DIAGNOSIS — E1149 Type 2 diabetes mellitus with other diabetic neurological complication: Secondary | ICD-10-CM

## 2014-03-02 MED ORDER — METFORMIN HCL 500 MG PO TABS: ORAL_TABLET | ORAL | Status: DC

## 2014-03-02 NOTE — Progress Notes (Signed)
Patient ID: Zachary Harris, male   DOB: 1923/08/08, 80 y.o.   MRN: [id number]    Location:  [facility] (12)    No Known Allergies  Chief Complaint  Patient presents with   Medical Managment of Chronic Issues    blood pressure, blood sugar, anemia    HPI:  Type II or unspecified type diabetes mellitus with neurological manifestations, uncontrolled(250.62) - control is worse. He says he is avoiding sweets  HYPERTENSION: controlled  PERIPHERAL VASCULAR DISEASE: denies pain with walking  CORONARY ARTERY DISEASE: no angina or palpitations  Memory loss: unchanged  Incomplete bladder emptying: having a little more difficulty than in the past    Medications: Patient's Medications  New Prescriptions   No medications on file  Previous Medications   ALENDRONATE (FOSAMAX) 70 MG TABLET    TAKE 1 TAB ONCE A WK AT LEAST 30 MIN BEFORE 1ST FOOD.DO NOT LIE DOWN FOR 30 MIN AFTER TAKING.   ATORVASTATIN (LIPITOR) 40 MG TABLET    Take 1 tablet (40 mg total) by mouth daily. Take 1 tablet once daily for cholesterol.   FENOFIBRATE 160 MG TABLET    TAKE 1 TABLET ONCE A DAY TO CONTROL CHOLESTEROL.   FUROSEMIDE (LASIX) 20 MG TABLET    TAKE 1 TABLET IN THE MORNING.   METHOCARBAMOL (ROBAXIN) 500 MG TABLET    One every 8 hours to relax muscles   NITROGLYCERIN (NITROSTAT) 0.4 MG SL TABLET    Place 0.4 mg under the tongue every 5 (five) minutes as needed for chest pain.   SOLIFENACIN (VESICARE) 10 MG TABLET    One daily to decrease urinary urgrency   TAMSULOSIN (FLOMAX) 0.4 MG CAPS CAPSULE    Take one tablet daily for bladder   TRAMADOL (ULTRAM) 50 MG TABLET    Take 1 tablet (50 mg total) by mouth every 8 (eight) hours as needed for pain.   WARFARIN (COUMADIN) 2.5 MG TABLET    TAKE AS DIRECTED BY COUMADIN CLINIC.  Modified Medications   No medications on file  Discontinued Medications   No medications on file     Review of Systems  Constitutional: Negative for  fever, chills, activity change, appetite change, fatigue and unexpected weight change.  HENT: Positive for hearing loss. Negative for ear pain and sinus pressure.   Eyes: Negative for photophobia, pain and visual disturbance.       Corrective lenses  Respiratory: Positive for shortness of breath. Negative for cough, chest tightness and wheezing.   Cardiovascular: Positive for leg swelling.       Pacemaker. History of syncope related to cardiac arrhythmia.  Gastrointestinal: Negative for abdominal pain and abdominal distention.  Endocrine: Negative for cold intolerance, heat intolerance, polydipsia, polyphagia and polyuria.       History of diabetes.  Genitourinary: Positive for urgency and frequency. Negative for dysuria and flank pain.       Urge incontinence. Increased frequency which has not responded well to [place]. Continues to see urologist.  Musculoskeletal: Positive for arthralgias, back pain and gait problem.       Poor balance and gait disturbance.  Skin:       Rough area on left cheek  Allergic/Immunologic: Negative.   Neurological: Negative for tremors, seizures, facial asymmetry and weakness.  Hematological: Negative.   Psychiatric/Behavioral: Positive for confusion.       Mild to moderate memory disturbance.    Filed Vitals:   03/02/14 0855  BP: 100/62  Pulse: 18  Height: 5' 6.5" (1.689 m)  Weight: 184 lb (83.462 kg)   Physical Exam  Constitutional: He is oriented to person, place, and time.  Frail  HENT:  Head: Normocephalic and atraumatic.  Nose: Nose normal.  Bilateral hearing aids.  Eyes: Conjunctivae are normal. Pupils are equal, round, and reactive to light.  Prescription lenses.  Neck: Normal range of motion. Neck supple. No JVD present. No tracheal deviation present. No thyromegaly present.  Cardiovascular: Normal rate and regular rhythm.  Exam reveals no gallop and no friction rub.   Murmur heard.  Systolic murmur is present with a grade of 1/6    Pulmonary/Chest: Effort normal and breath sounds normal. No respiratory distress. He has no wheezes. He has no rales. He exhibits no tenderness.  Abdominal: Soft. Bowel sounds are normal. He exhibits no distension and no mass. There is no tenderness.  Musculoskeletal: Normal range of motion. He exhibits edema. He exhibits no tenderness.  Pain in the left SI joint area. Limps when walking. Favors the left side  Lymphadenopathy:    He has no cervical adenopathy.  Neurological: He is alert and oriented to person, place, and time. He has normal strength. A sensory deficit is present. No cranial nerve deficit. Gait abnormal.  Skin: Skin is warm and dry. No erythema. No pallor.  Rough area on the left cheek  Psychiatric: He has a normal mood and affect. His behavior is normal. Thought content normal.  Patient is in denial regarding the extent of his memory deficit.     Labs reviewed: Nursing Home on 03/02/2014  Component Date Value Ref Range Status   Glucose 03/01/2014 179   Final   BUN 03/01/2014 34* 4 - 21 mg/dL Final   Creatinine 03/01/2014 1.4* 0.6 - 1.3 mg/dL Final   Potassium 03/01/2014 4.4  3.4 - 5.3 mmol/L Final   Sodium 03/01/2014 136* 137 - 147 mmol/L Final   Triglycerides 03/01/2014 221* 40 - 160 mg/dL Final   Cholesterol 03/01/2014 163  0 - 200 mg/dL Final   HDL 03/01/2014 26* 35 - 70 mg/dL Final   LDL Cholesterol 03/01/2014 93   Final  Anti-coag visit on 02/15/2014  Component Date Value Ref Range Status   INR 02/15/2014 2.9   Final  Clinical Support on 01/18/2014  Component Date Value Ref Range Status   Date Time Interrogation Session 01/18/2014 [id number]   Final   Pulse Generator Manufacturer 01/18/2014 [employer]   Final   Pulse Gen Model 01/18/2014 [address] RF   Final   Pulse Gen Serial Number 01/18/2014 [id number]   Final   RV Sense Sensitivity 01/18/2014 2   Final   RV Adaptation Mode 01/18/2014 Fixed Pacing   Final   RA Pace Amplitude  01/18/2014 2   Final   RV Pace PulseWidth 01/18/2014 0.4   Final   RV Pace Amplitude 01/18/2014 2.5   Final   Lead Channel Status 01/18/2014    Final   RA Impedance 01/18/2014 450   Final   RA Amplitude 01/18/2014 3.2   Final   RA Pacing Amplitude 01/18/2014 0.75   Final   RA Pacing PulseWidth 01/18/2014 0.4   Final   Lead Channel Status 01/18/2014    Final   RV IMPEDANCE 01/18/2014 600   Final   RV Amplitude 01/18/2014 12   Final   RV Pacing Amplitude 01/18/2014 0.5   Final   RV Pacing PulseWidth 01/18/2014 0.4   Final   Battery Status  01/18/2014 MOS   Final   Battery Longevity 01/18/2014 107   Final   Battery Voltage 01/18/2014 2.96   Final   Brady RA Perc Paced 01/18/2014 2.9   Final   Brady RV Perc Paced 01/18/2014 14   Final   Brady AP VP Percent 01/18/2014 1.4   Final   Brady AS VP Percent 01/18/2014 13   Final   Brady AP VS Percent 01/18/2014 3.7   Final   Brady AS VS Percent 01/18/2014 80   Final   Eval Rhythm 01/18/2014 SR@67    Final   Miscellaneous Comment 01/18/2014    Final                   Value:Pacemaker remote check. Device function reviewed. Impedance, sensing, auto capture thresholds consistent with previous measurements. Histograms appropriate for patient and level of activity. All other diagnostic data reviewed and is appropriate and                          stable for patient. Real time/magnet EGM shows appropriate sensing and capture. 11 mode switches, <1%, + coumadin.  1 ventricular high rate episode, VT 7 beats. Estimated longevity 8.5 years. Plan to follow in 3 months remotely, to see in office                          annually.  Next remote 04/21/14.  Anti-coag visit on 01/01/2014  Component Date Value Ref Range Status   INR 01/01/2014 2.4   Final  Anti-coag visit on 12/04/2013  Component Date Value Ref Range Status   INR 12/04/2013 2.8   Final      Assessment/Plan  1. Type II or unspecified type diabetes mellitus with  neurological manifestations, uncontrolled(250.62) add medication for better control - metFORMIN (GLUCOPHAGE) 500 MG tablet; One each morning to control glucose  Dispense: 90 tablet; Refill: 3  2. HYPERTENSION controlled  3. PERIPHERAL VASCULAR DISEASE asymptomatic  4. CORONARY ARTERY DISEASE asymptomatic  5. Memory loss Check MMSE next vist  6. Incomplete bladder emptying Continue current medicatioin. Normal PSA on lab 03/01/14.

## 2014-03-17 ENCOUNTER — Encounter: Payer: Self-pay | Admitting: Internal Medicine

## 2014-03-30 ENCOUNTER — Other Ambulatory Visit: Payer: Self-pay | Admitting: Internal Medicine

## 2014-03-30 ENCOUNTER — Encounter: Payer: Self-pay | Admitting: Internal Medicine

## 2014-03-31 ENCOUNTER — Ambulatory Visit (INDEPENDENT_AMBULATORY_CARE_PROVIDER_SITE_OTHER): Payer: Medicare Other | Admitting: *Deleted

## 2014-03-31 DIAGNOSIS — I82409 Acute embolism and thrombosis of unspecified deep veins of unspecified lower extremity: Secondary | ICD-10-CM

## 2014-03-31 DIAGNOSIS — Z7901 Long term (current) use of anticoagulants: Secondary | ICD-10-CM

## 2014-03-31 LAB — POCT INR: INR: 4.6

## 2014-04-09 ENCOUNTER — Ambulatory Visit (INDEPENDENT_AMBULATORY_CARE_PROVIDER_SITE_OTHER): Payer: Medicare Other | Admitting: Pharmacist

## 2014-04-09 DIAGNOSIS — Z7901 Long term (current) use of anticoagulants: Secondary | ICD-10-CM

## 2014-04-09 DIAGNOSIS — I82409 Acute embolism and thrombosis of unspecified deep veins of unspecified lower extremity: Secondary | ICD-10-CM

## 2014-04-09 LAB — POCT INR: INR: 2.5

## 2014-04-16 ENCOUNTER — Ambulatory Visit (INDEPENDENT_AMBULATORY_CARE_PROVIDER_SITE_OTHER): Payer: Medicare Other | Admitting: Cardiology

## 2014-04-16 ENCOUNTER — Encounter: Payer: Self-pay | Admitting: Cardiology

## 2014-04-16 VITALS — BP 100/50 | HR 82 | Ht 69.0 in | Wt 186.0 lb

## 2014-04-16 DIAGNOSIS — I739 Peripheral vascular disease, unspecified: Secondary | ICD-10-CM

## 2014-04-16 DIAGNOSIS — I2510 Atherosclerotic heart disease of native coronary artery without angina pectoris: Secondary | ICD-10-CM

## 2014-04-16 DIAGNOSIS — E785 Hyperlipidemia, unspecified: Secondary | ICD-10-CM

## 2014-04-16 DIAGNOSIS — Z950 Presence of cardiac pacemaker: Secondary | ICD-10-CM

## 2014-04-16 DIAGNOSIS — I6529 Occlusion and stenosis of unspecified carotid artery: Secondary | ICD-10-CM

## 2014-04-16 DIAGNOSIS — I82409 Acute embolism and thrombosis of unspecified deep veins of unspecified lower extremity: Secondary | ICD-10-CM

## 2014-04-16 NOTE — Assessment & Plan Note (Addendum)
Continue statin; Add asa 81 mg daily.

## 2014-04-16 NOTE — Assessment & Plan Note (Signed)
Continue statin. 

## 2014-04-16 NOTE — Assessment & Plan Note (Addendum)
Pt on long term coumadin; Had bilateral DVT in March of 2012 but no other history of venous thrombosis; DC coumadin and treat with ASA 81 mg daily.

## 2014-04-16 NOTE — Assessment & Plan Note (Addendum)
Continue statin; add asa. 20 min spent reviewing records prior to office visit.

## 2014-04-16 NOTE — Progress Notes (Signed)
HPI: 80 yo male previously followed by Dr Verl Blalock for fu of CAD. Patient had CABG 1997. Abd ultrasound 8/07 showed no aneurysm. Inferior MI 11/07; Severe 3 vessel disease; occluded SVG to RCA; SVG to OM subtotalled, SVG to diagonal with ostial 70 and 80 mid; EF 45. PCI of SVG to OM. FU myovue showed EF 56 with inferior infarct and very mild peri-infarct ischemia. Echo 9/09 showed EF 55 with inferior HK, mild LAE and mild MR. ABI 5/12-left mildly abnormal; normal right. Carotid dopplers 12/14 showed 1-39% bilateral stenosis. Also s/p pacemaker. Since last seen, no dyspnea, chest pain, pedal edema, syncope or bleeding.   Current Outpatient Prescriptions  Medication Sig Dispense Refill  . alendronate (FOSAMAX) 70 MG tablet TAKE 1 TAB ONCE A WK AT LEAST 30 MIN BEFORE 1ST FOOD.DO NOT LIE DOWN FOR 30 MIN AFTER TAKING.  12 tablet  1  . atorvastatin (LIPITOR) 40 MG tablet Take 1 tablet (40 mg total) by mouth daily. Take 1 tablet once daily for cholesterol.  30 tablet  3  . fenofibrate 160 MG tablet TAKE 1 TABLET ONCE A DAY TO CONTROL CHOLESTEROL.  30 tablet  3  . furosemide (LASIX) 20 MG tablet TAKE 1 TABLET IN THE MORNING.  90 tablet  0  . metFORMIN (GLUCOPHAGE) 500 MG tablet One each morning to control glucose  90 tablet  3  . methocarbamol (ROBAXIN) 500 MG tablet One every 8 hours to relax muscles  50 tablet  1  . nitroGLYCERIN (NITROSTAT) 0.4 MG SL tablet Place 0.4 mg under the tongue every 5 (five) minutes as needed for chest pain.      Marland Kitchen solifenacin (VESICARE) 10 MG tablet One daily to decrease urinary urgrency  30 tablet  5  . tamsulosin (FLOMAX) 0.4 MG CAPS capsule Take one tablet daily for bladder      . traMADol (ULTRAM) 50 MG tablet Take 1 tablet (50 mg total) by mouth every 8 (eight) hours as needed for pain.  50 tablet  0  . warfarin (COUMADIN) 2.5 MG tablet TAKE AS DIRECTED BY COUMADIN CLINIC.  65 tablet  3   No current facility-administered medications for this visit.     Past  Medical History  Diagnosis Date  . ANEMIA-IRON DEFICIENCY 05/27/2007  . ANXIETY 11/14/2007  . Intermittent high-grade heart block 09/05/2009  . BENIGN PROSTATIC HYPERTROPHY 11/14/2007  . CAROTID ARTERY DISEASE 10/02/2010  . Pacemaker-St. Jude 02/02/2008    St. Jude  . COLONIC POLYPS, HX OF 05/27/2007  . CORONARY ARTERY DISEASE 11/14/2007    prior bypass surgery; ejection fraction 40-45% 2012  . DEPRESSION 11/14/2007  . DIABETES MELLITUS, TYPE II 11/14/2007  . DIVERTICULITIS, HX OF 05/27/2007  . DVT (deep venous thrombosis) 03/21/2011  . Edema 01/15/2011  . FOOT PAIN, BILATERAL 12/08/2010  . GERD 11/14/2007  . GROIN PAIN 09/24/2008  . HYPERLIPIDEMIA 11/14/2007  . HYPERTENSION 11/14/2007  . HYPOTENSION, ORTHOSTATIC 03/06/2011  . Long term current use of anticoagulant 03/21/2011  . Memory loss 06/01/2008  . OSTEOPOROSIS 06/02/2010  . PERIPHERAL VASCULAR DISEASE 11/14/2007  . RASH-NONVESICULAR 01/22/2008  . RENAL INSUFFICIENCY 11/14/2007  . SECONDARY DM W/PERIPHERAL CIRC D/O UNCONTROLLED 11/07/2010  . SYNCOPE 09/08/2008  . Unspecified hearing loss 05/13/2009  . VERTEBRAL FRACTURE 09/05/2009  . Prostate cancer   . Acute cystitis   . Myocardial infarction   . Seborrheic keratosis 06/05/2011  . Chronic pain 04/24/2011  . Unspecified hereditary and idiopathic peripheral neuropathy 04/24/2011  . Myalgia and myositis,  unspecified 04/24/2011  . Other malaise and fatigue 04/24/2011  . Abnormality of gait 04/24/2011  . Type II or unspecified type diabetes mellitus with neurological manifestations, uncontrolled 03/27/2011  . Urinary frequency 03/27/2011  . Unspecified disorder of kidney and ureter 03/23/2011  . Osteoarthrosis, unspecified whether generalized or localized, unspecified site 03/23/2011  . Seborrheic keratosis 06/05/2011  . Chronic pain 04/24/2011  . Unspecified hereditary and idiopathic peripheral neuropathy 04/24/2011  . Malignant neoplasm of prostate 03/23/2011  . Conduction disorder, unspecified 03/23/2011    . Phlebitis and thrombophlebitis of other deep vessels of lower extremities 03/23/2011  . Osteoarthrosis, unspecified whether generalized or localized, unspecified site 03/23/2011  . Syncope and collapse 03/13/2011    Past Surgical History  Procedure Laterality Date  . S/p coronary stent x 1    . Cholecystectomy    . S/p bilat knee replacement  Cleon Gustin, MD  . Pacemaker insertion  2012    Caryl Comes, MD  . Prostate needle biopsy    . Cystoscopy    . Appendectomy    . Coronary artery bypass graft  2007    History   Social History  . Marital Status: Widowed    Spouse Name: N/A    Number of Children: 2  . Years of Education: N/A   Occupational History  . retired operated [employer]  . Smoking status: Former Smoker    Quit date: 10/18/1983  . Smokeless tobacco: Never Used  . Alcohol Use: No  . Drug Use: No  . Sexual Activity: No   Other Topics Concern  . Not on file   Social History Narrative  . No narrative on file    ROS: no fevers or chills, productive cough, hemoptysis, dysphasia, odynophagia, melena, hematochezia, dysuria, hematuria, rash, seizure activity, orthopnea, PND, pedal edema, claudication. Remaining systems are negative.  Physical Exam: Well-developed frail in no acute distress.  Skin is warm and dry.  HEENT is normal.  Neck is supple.  Chest is clear to auscultation with normal expansion.  Cardiovascular exam is regular rate and rhythm.  Abdominal exam nontender or distended. No masses palpated. Extremities show no edema. neuro grossly intact  ECG Sinus, first degree AV block, LVH with repol., inferior MI.

## 2014-04-16 NOTE — Assessment & Plan Note (Signed)
Followed by EP 

## 2014-04-16 NOTE — Patient Instructions (Signed)
Your physician wants you to follow-up in: ONE YEAR WITH DR CRENSHAW You will receive a reminder letter in the mail two months in advance. If you don't receive a letter, please call our office to schedule the follow-up appointment.  

## 2014-04-19 ENCOUNTER — Telehealth: Payer: Self-pay | Admitting: *Deleted

## 2014-04-19 ENCOUNTER — Ambulatory Visit: Payer: Self-pay | Admitting: Pharmacist

## 2014-04-19 DIAGNOSIS — Z7901 Long term (current) use of anticoagulants: Secondary | ICD-10-CM

## 2014-04-19 DIAGNOSIS — I82409 Acute embolism and thrombosis of unspecified deep veins of unspecified lower extremity: Secondary | ICD-10-CM

## 2014-04-19 MED ORDER — ASPIRIN EC 81 MG PO TBEC: 81.0000 mg | DELAYED_RELEASE_TABLET | Freq: Every day | ORAL | Status: DC

## 2014-04-19 NOTE — Telephone Encounter (Signed)
After review of pt chart, per dr Stanford Breed pt instructed to stop coumadin. Pt to start asa 81 mg once daily Patient voiced understanding

## 2014-04-21 ENCOUNTER — Ambulatory Visit (INDEPENDENT_AMBULATORY_CARE_PROVIDER_SITE_OTHER): Payer: Medicare Other | Admitting: *Deleted

## 2014-04-21 DIAGNOSIS — I442 Atrioventricular block, complete: Secondary | ICD-10-CM

## 2014-04-23 ENCOUNTER — Telehealth: Payer: Self-pay | Admitting: Cardiology

## 2014-04-23 NOTE — Telephone Encounter (Signed)
Spoke with pt dtr, questions regarding warfarin and asa answered

## 2014-04-23 NOTE — Telephone Encounter (Signed)
New message    Patient daughter calling has some concerns that a pacemaker patient is on asa 81 mg .  Is this common for pacemaker patients.    Daughter stated she does not understand why he was taken off coumadin.   Read message to  daughter from  Nurse Hilda Blades on  4/27 instruction given to patient he voice  understanding.    Daughter stated she need a call back today . Make sure patient and she understand complete & explanation as to why he was taken off coumadin.

## 2014-04-27 LAB — MDC_IDC_ENUM_SESS_TYPE_REMOTE
Brady Statistic AP VP Percent: 1 %
Brady Statistic AP VS Percent: 3.2 %
Brady Statistic AS VP Percent: 17 %
Brady Statistic RA Percent Paced: 2.7 %
Brady Statistic RV Percent Paced: 18 %
Date Time Interrogation Session: 20150429060026
Lead Channel Impedance Value: 460 Ohm
Lead Channel Sensing Intrinsic Amplitude: 3.8 mV
Lead Channel Setting Pacing Amplitude: 2.5 V
Lead Channel Setting Pacing Pulse Width: 0.4 ms
MDC IDC MSMT BATTERY REMAINING LONGEVITY: 105 mo
MDC IDC MSMT BATTERY VOLTAGE: 2.96 V
MDC IDC MSMT LEADCHNL RV IMPEDANCE VALUE: 640 Ohm
MDC IDC MSMT LEADCHNL RV SENSING INTR AMPL: 12 mV
MDC IDC PG SERIAL: 7228315
MDC IDC SET LEADCHNL RA PACING AMPLITUDE: 2 V
MDC IDC SET LEADCHNL RV SENSING SENSITIVITY: 2 mV
MDC IDC STAT BRADY AS VS PERCENT: 78 %

## 2014-05-07 ENCOUNTER — Encounter: Payer: Self-pay | Admitting: Cardiology

## 2014-05-07 NOTE — Progress Notes (Signed)
Remote pacemaker transmission.   

## 2014-05-11 ENCOUNTER — Encounter: Payer: Self-pay | Admitting: Internal Medicine

## 2014-06-23 ENCOUNTER — Other Ambulatory Visit: Payer: Self-pay | Admitting: Internal Medicine

## 2014-06-28 ENCOUNTER — Other Ambulatory Visit: Payer: Self-pay | Admitting: Cardiology

## 2014-06-28 LAB — BASIC METABOLIC PANEL
BUN: 30 mg/dL — AB (ref 4–21)
CREATININE: 1.3 mg/dL (ref 0.6–1.3)
GLUCOSE: 141 mg/dL
Potassium: 4.3 mmol/L (ref 3.4–5.3)
SODIUM: 137 mmol/L (ref 137–147)

## 2014-06-28 LAB — HEMOGLOBIN A1C: HEMOGLOBIN A1C: 7 % — AB (ref 4.0–6.0)

## 2014-07-06 ENCOUNTER — Encounter: Payer: Medicare Other | Admitting: Internal Medicine

## 2014-07-13 ENCOUNTER — Non-Acute Institutional Stay: Payer: Medicare Other | Admitting: Internal Medicine

## 2014-07-13 ENCOUNTER — Encounter: Payer: Self-pay | Admitting: Internal Medicine

## 2014-07-13 VITALS — BP 130/72 | HR 60 | Temp 97.2°F | Wt 188.0 lb

## 2014-07-13 DIAGNOSIS — I739 Peripheral vascular disease, unspecified: Secondary | ICD-10-CM

## 2014-07-13 DIAGNOSIS — E1159 Type 2 diabetes mellitus with other circulatory complications: Secondary | ICD-10-CM

## 2014-07-13 DIAGNOSIS — F3289 Other specified depressive episodes: Secondary | ICD-10-CM

## 2014-07-13 DIAGNOSIS — R413 Other amnesia: Secondary | ICD-10-CM

## 2014-07-13 DIAGNOSIS — I2510 Atherosclerotic heart disease of native coronary artery without angina pectoris: Secondary | ICD-10-CM

## 2014-07-13 DIAGNOSIS — I10 Essential (primary) hypertension: Secondary | ICD-10-CM

## 2014-07-13 DIAGNOSIS — E785 Hyperlipidemia, unspecified: Secondary | ICD-10-CM

## 2014-07-13 DIAGNOSIS — F329 Major depressive disorder, single episode, unspecified: Secondary | ICD-10-CM

## 2014-07-13 DIAGNOSIS — N259 Disorder resulting from impaired renal tubular function, unspecified: Secondary | ICD-10-CM

## 2014-07-13 NOTE — Progress Notes (Signed)
Passed clock drawing 

## 2014-07-13 NOTE — Progress Notes (Signed)
Patient ID: Zachary Harris, male   DOB: January 09, 1923, 80 y.o.   MRN: [id number]    Location:  [facility] (12)    No Known Allergies  Chief Complaint  Patient presents with   Medical Management of Chronic Issues    blood sugar, blood pressure, memory.  Daughter [name] wants referral to Neurologist for memory issues.  Here today with son [name]    HPI:  Memory change: although the patient says he is not having any problems with his memory, his son is present and reports memory lapses for recent events and plans. This new development goes back a few months according to his son. He would like a neurology referral. Patient scored 28/30on the MMSE today and was able to perform a clock drawing test adequately.  Type II or unspecified type diabetes mellitus with peripheral circulatory disorders, not stated as uncontrolled(250.70): controlled  DEPRESSION: no longer medicated and seems to be doing well.  HYPERTENSION: controlled  CORONARY ARTERY DISEASE: no angina or palpitations  PERIPHERAL VASCULAR DISEASE: diminished DP and PT bilaterally. Hx of bilateralDVT in 2012. Patient does not recall this, but it is documented in hospitalization notes  RENAL INSUFFICIENCY: mild  HYPERLIPIDEMIA: controlled  ANTICOAGULATION: previously on warfarin, but this was stopped by Dr. Stanford Breed in April 2015. He is now on ASA 81 mg qd.    Medications: Patient's Medications  New Prescriptions   No medications on file  Previous Medications   ALENDRONATE (FOSAMAX) 70 MG TABLET    TAKE 1 TAB ONCE A WK AT LEAST 30 MIN BEFORE 1ST FOOD.DO NOT LIE DOWN FOR 30 MIN AFTER TAKING.   ASPIRIN EC 81 MG TABLET    Take 1 tablet (81 mg total) by mouth daily.   ATORVASTATIN (LIPITOR) 40 MG TABLET    TAKE 1 TABLET ONCE A DAY TO CONTROL CHOLESTEROL.   FENOFIBRATE 160 MG TABLET    TAKE 1 TABLET ONCE A DAY TO CONTROL CHOLESTEROL.   METFORMIN (GLUCOPHAGE) 500 MG TABLET    One each morning to  control glucose   NITROGLYCERIN (NITROSTAT) 0.4 MG SL TABLET    Place 0.4 mg under the tongue every 5 (five) minutes as needed for chest pain.   SOLIFENACIN (VESICARE) 10 MG TABLET    One daily to decrease urinary urgrency   TAMSULOSIN (FLOMAX) 0.4 MG CAPS CAPSULE    Take one tablet daily for bladder   TRAMADOL (ULTRAM) 50 MG TABLET    Take 1 tablet (50 mg total) by mouth every 8 (eight) hours as needed for pain.  Modified Medications   No medications on file  Discontinued Medications   FUROSEMIDE (LASIX) 20 MG TABLET    TAKE 1 TABLET IN THE MORNING.     Review of Systems  Constitutional: Negative for fever, chills, activity change, appetite change, fatigue and unexpected weight change.  HENT: Positive for hearing loss. Negative for ear pain and sinus pressure.   Eyes: Negative for photophobia, pain and visual disturbance.       Corrective lenses  Respiratory: Positive for shortness of breath. Negative for cough, chest tightness and wheezing.   Cardiovascular: Positive for leg swelling.       Pacemaker. History of syncope related to cardiac arrhythmia.  Gastrointestinal: Negative for abdominal pain and abdominal distention.  Endocrine: Negative for cold intolerance, heat intolerance, polydipsia, polyphagia and polyuria.       History of diabetes.  Genitourinary: Positive for urgency and frequency. Negative for dysuria  and flank pain.       Urge incontinence. Increased frequency which has not responded well to [place]. Continues to see urologist.  Musculoskeletal: Positive for arthralgias, back pain and gait problem.       Poor balance and gait disturbance.  Skin:       Rough area on left cheek  Allergic/Immunologic: Negative.   Neurological: Negative for tremors, seizures, facial asymmetry and weakness.       Memory loss  Hematological: Negative.   Psychiatric/Behavioral: Positive for confusion.       Mild to moderate memory disturbance.    Filed Vitals:   07/13/14 0915  BP:  130/72  Pulse: 60  Temp: 97.2 F (36.2 C)  TempSrc: Oral  Weight: 188 lb (85.276 kg)   Body mass index is 27.75 kg/(m^2).  Physical Exam  Constitutional: He is oriented to person, place, and time.  Frail  HENT:  Head: Normocephalic and atraumatic.  Nose: Nose normal.  Bilateral hearing aids.  Eyes: Conjunctivae are normal. Pupils are equal, round, and reactive to light.  Prescription lenses.  Neck: Normal range of motion. Neck supple. No JVD present. No tracheal deviation present. No thyromegaly present.  Cardiovascular: Normal rate and regular rhythm.  Exam reveals no gallop and no friction rub.   Murmur heard.  Systolic murmur is present with a grade of 1/6  Absent DP and PT  Pulmonary/Chest: Effort normal and breath sounds normal. No respiratory distress. He has no wheezes. He has no rales. He exhibits no tenderness.  Abdominal: Soft. Bowel sounds are normal. He exhibits no distension and no mass. There is no tenderness.  Musculoskeletal: Normal range of motion. He exhibits edema. He exhibits no tenderness.  Pain in the left SI joint area. Limps when walking. Favors the left side  Lymphadenopathy:    He has no cervical adenopathy.  Neurological: He is alert and oriented to person, place, and time. He has normal strength. A sensory deficit is present. No cranial nerve deficit. Gait abnormal.  07/13/14 MMSE 28/30. Passed clock drawing. Diminshed sensation to vabration and monofilament.  Skin: Skin is warm and dry. No erythema. No pallor.  Rough area on the left cheek  Psychiatric: He has a normal mood and affect. His behavior is normal. Thought content normal.  Patient is in denial regarding the extent of his memory deficit.     Labs reviewed: Nursing Home on 07/13/2014  Component Date Value Ref Range Status   Glucose 06/28/2014 141   Final   BUN 06/28/2014 30* 4 - 21 mg/dL Final   Creatinine 06/28/2014 1.3  0.6 - 1.3 mg/dL Final   Potassium 06/28/2014 4.3  3.4 - 5.3  mmol/L Final   Sodium 06/28/2014 137  137 - 147 mmol/L Final   Hemoglobin A1C 06/28/2014 7.0* 4.0 - 6.0 % Final  Clinical Support on 04/21/2014  Component Date Value Ref Range Status   Date Time Interrogation Session 04/27/2014 [id number]   Final   Pulse Generator Manufacturer 04/27/2014 [employer]   Final   Pulse Gen Model 04/27/2014 [address] RF   Final   Pulse Gen Serial Number 04/27/2014 [id number]   Final   RV Sense Sensitivity 04/27/2014 2   Final   RV Adaptation Mode 04/27/2014 Fixed Pacing   Final   RA Pace Amplitude 04/27/2014 2   Final   RV Pace PulseWidth 04/27/2014 0.4   Final   RV Pace Amplitude 04/27/2014 2.5   Final   Lead Channel Status 04/27/2014  Final   RA Impedance 04/27/2014 460   Final   RA Amplitude 04/27/2014 3.8   Final   Lead Channel Status 04/27/2014    Final   RV IMPEDANCE 04/27/2014 640   Final   RV Amplitude 04/27/2014 12   Final   Battery Status 04/27/2014 MOS   Final   Battery Longevity 04/27/2014 105   Final   Battery Voltage 04/27/2014 2.96   Final   Brady RA Perc Paced 04/27/2014 2.7   Final   Brady RV Perc Paced 04/27/2014 18   Final   Brady AP VP Percent 04/27/2014 1   Final   Brady AS VP Percent 04/27/2014 17   Final   Brady AP VS Percent 04/27/2014 3.2   Final   Brady AS VS Percent 04/27/2014 78   Final   Eval Rhythm 04/27/2014 SR   Final   Miscellaneous Comment 04/27/2014    Final                   Value:Pacemaker remote check. Device function reviewed. Impedance, sensing, auto capture thresholds consistent with previous measurements. Histograms appropriate for patient and level of activity. All other diagnostic data reviewed and is appropriate and                          stable for patient. Real time/magnet EGM shows appropriate sensing and capture. 12 mode switches--longest was 42 minutes 26 seconds. <1% of time. + Warfarin. 2 ventricular high rate episodes--longest was 10 beats. Estimated  longevity 8.4 to 9.1 years.                          Merlin 07-26-14 and ROV in October with SK.      Assessment/Plan 1. Memory change - Ambulatory referral to Neurology  2. Type II or unspecified type diabetes mellitus with peripheral circulatory disorders, not stated as uncontrolled(250.70) controlled - Basic metabolic panel - Hemoglobin A1c  3. DEPRESSION resolved  4. HYPERTENSION controlled  5. CORONARY ARTERY DISEASE stable  6. PERIPHERAL VASCULAR DISEASE stable  7. RENAL INSUFFICIENCY Routine obserevation  8. HYPERLIPIDEMIA controlled

## 2014-07-20 ENCOUNTER — Encounter: Payer: Self-pay | Admitting: Internal Medicine

## 2014-07-22 ENCOUNTER — Other Ambulatory Visit: Payer: Self-pay | Admitting: Internal Medicine

## 2014-07-26 ENCOUNTER — Ambulatory Visit (INDEPENDENT_AMBULATORY_CARE_PROVIDER_SITE_OTHER): Payer: Medicare Other | Admitting: *Deleted

## 2014-07-26 DIAGNOSIS — I442 Atrioventricular block, complete: Secondary | ICD-10-CM

## 2014-07-26 LAB — MDC_IDC_ENUM_SESS_TYPE_REMOTE
Battery Remaining Percentage: 82 %
Battery Voltage: 2.96 V
Brady Statistic AP VP Percent: 1 %
Brady Statistic RV Percent Paced: 22 %
Date Time Interrogation Session: 20150803071318
Implantable Pulse Generator Model: 2210
Implantable Pulse Generator Serial Number: 7228315
Lead Channel Impedance Value: 460 Ohm
Lead Channel Pacing Threshold Pulse Width: 0.4 ms
Lead Channel Pacing Threshold Pulse Width: 0.4 ms
Lead Channel Sensing Intrinsic Amplitude: 3.7 mV
Lead Channel Setting Pacing Pulse Width: 0.4 ms
MDC IDC MSMT BATTERY REMAINING LONGEVITY: 110 mo
MDC IDC MSMT LEADCHNL RA PACING THRESHOLD AMPLITUDE: 0.75 V
MDC IDC MSMT LEADCHNL RV IMPEDANCE VALUE: 650 Ohm
MDC IDC MSMT LEADCHNL RV PACING THRESHOLD AMPLITUDE: 0.5 V
MDC IDC MSMT LEADCHNL RV SENSING INTR AMPL: 12 mV
MDC IDC SET LEADCHNL RA PACING AMPLITUDE: 2 V
MDC IDC SET LEADCHNL RV PACING AMPLITUDE: 2.5 V
MDC IDC SET LEADCHNL RV SENSING SENSITIVITY: 2 mV
MDC IDC STAT BRADY AP VS PERCENT: 3.6 %
MDC IDC STAT BRADY AS VP PERCENT: 21 %
MDC IDC STAT BRADY AS VS PERCENT: 73 %
MDC IDC STAT BRADY RA PERCENT PACED: 3.4 %

## 2014-07-27 NOTE — Progress Notes (Signed)
Remote pacemaker transmission.   

## 2014-07-30 ENCOUNTER — Ambulatory Visit (INDEPENDENT_AMBULATORY_CARE_PROVIDER_SITE_OTHER): Payer: Medicare Other | Admitting: Neurology

## 2014-07-30 ENCOUNTER — Encounter: Payer: Self-pay | Admitting: Neurology

## 2014-07-30 VITALS — BP 151/76 | HR 68 | Ht 68.75 in | Wt 185.0 lb

## 2014-07-30 DIAGNOSIS — R413 Other amnesia: Secondary | ICD-10-CM

## 2014-07-30 MED ORDER — MEMANTINE HCL ER 14 MG PO CP24: ORAL_CAPSULE | ORAL | Status: DC

## 2014-07-30 NOTE — Progress Notes (Signed)
PATIENT: Zachary Harris DOB: 1923-09-17  HISTORICAL  Zachary Harris is a 80 years old gentleman , accompanied by her daughter Zachary Harris, referred by his care physician Dr. Raliegh Harris for evaluation of memory trouble.  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, who now runs his company, was very active until age 49  He was noticed to have mild memory trouble since then, his wife has to write a note to remind him, he lives independent living, since his wife passed away a few years ago, he was noticed to have memory trouble, missing his medications since January 2015  He is much less active, watching TV most of the time, still drives to his office daily, but not doing much, he is able to drive without getting loss, is able to do daily Bank deposit, keep track the balance, he goes out eat meals regularly  REVIEW OF SYSTEMS: Full 14 system review of systems performed and notable only for sleepiness, memory loss, confusion,  ALLERGIES: No Known Allergies  HOME MEDICATIONS: Current Outpatient Prescriptions on File Prior to Visit  Medication Sig Dispense Refill  . alendronate (FOSAMAX) 70 MG tablet TAKE 1 TAB ONCE A WK AT LEAST 30 MIN BEFORE 1ST FOOD.DO NOT LIE DOWN FOR 30 MIN AFTER TAKING.  12 tablet  0  . fenofibrate 160 MG tablet TAKE 1 TABLET ONCE A DAY TO CONTROL CHOLESTEROL.  30 tablet  3  . metFORMIN (GLUCOPHAGE) 500 MG tablet One each morning to control glucose  90 tablet  3  . nitroGLYCERIN (NITROSTAT) 0.4 MG SL tablet Place 0.4 mg under the tongue every 5 (five) minutes as needed for chest pain.      Marland Kitchen solifenacin (VESICARE) 10  MG tablet One daily to decrease urinary urgrency  30 tablet  5  . tamsulosin (FLOMAX) 0.4 MG CAPS capsule Take one tablet daily for bladder      . traMADol (ULTRAM) 50 MG tablet Take 1 tablet (50 mg total) by mouth every 8 (eight) hours as needed for pain.  50 tablet  0   No current facility-administered medications on file prior to visit.    PAST MEDICAL HISTORY: Past Medical History  Diagnosis Date  . ANEMIA-IRON DEFICIENCY 05/27/2007  . ANXIETY 11/14/2007  . Intermittent high-grade heart block 09/05/2009  . BENIGN PROSTATIC HYPERTROPHY 11/14/2007  . CAROTID ARTERY DISEASE 10/02/2010  . Pacemaker-St. Jude 02/02/2008    St. Jude  . COLONIC POLYPS, HX OF 05/27/2007  . CORONARY ARTERY DISEASE 11/14/2007    prior bypass surgery; ejection fraction 40-45% 2012  . DEPRESSION 11/14/2007  . DIABETES MELLITUS, TYPE II 11/14/2007  . DIVERTICULITIS, HX OF 05/27/2007  . DVT (deep venous thrombosis) 03/21/2011  . Edema 01/15/2011  . FOOT PAIN, BILATERAL 12/08/2010  . GERD 11/14/2007  . GROIN PAIN 09/24/2008  . HYPERLIPIDEMIA 11/14/2007  . HYPERTENSION 11/14/2007  . HYPOTENSION, ORTHOSTATIC 03/06/2011  . Long term current use of anticoagulant 03/21/2011  . Memory loss 06/01/2008  . OSTEOPOROSIS 06/02/2010  . PERIPHERAL VASCULAR DISEASE 11/14/2007  . RASH-NONVESICULAR 01/22/2008  . RENAL INSUFFICIENCY 11/14/2007  . SECONDARY DM W/PERIPHERAL CIRC D/O UNCONTROLLED 11/07/2010  . SYNCOPE 09/08/2008  .  Unspecified hearing loss 05/13/2009  . VERTEBRAL FRACTURE 09/05/2009  . Prostate cancer   . Acute cystitis   . Myocardial infarction   . Seborrheic keratosis 06/05/2011  . Chronic pain 04/24/2011  . Unspecified hereditary and idiopathic peripheral neuropathy 04/24/2011  . Myalgia and myositis, unspecified 04/24/2011  . Other malaise and fatigue 04/24/2011  . Abnormality of gait 04/24/2011  . Type II or unspecified type diabetes mellitus with neurological manifestations, uncontrolled 03/27/2011  . Urinary frequency  03/27/2011  . Unspecified disorder of kidney and ureter 03/23/2011  . Osteoarthrosis, unspecified whether generalized or localized, unspecified site 03/23/2011  . Seborrheic keratosis 06/05/2011  . Chronic pain 04/24/2011  . Unspecified hereditary and idiopathic peripheral neuropathy 04/24/2011  . Malignant neoplasm of prostate 03/23/2011  . Conduction disorder, unspecified 03/23/2011  . Phlebitis and thrombophlebitis of other deep vessels of lower extremities 03/23/2011  . Osteoarthrosis, unspecified whether generalized or localized, unspecified site 03/23/2011  . Syncope and collapse 03/13/2011    PAST SURGICAL HISTORY: Past Surgical History  Procedure Laterality Date  . S/p coronary stent x 1    . Cholecystectomy    . S/p bilat knee replacement  Zachary Gustin, MD  . Pacemaker insertion  2012    Zachary Comes, MD  . Prostate needle biopsy    . Cystoscopy    . Appendectomy    . Coronary artery bypass graft  2007    FAMILY HISTORY: Family History  Problem Relation Age of Onset  . Diabetes Father     13  . Cancer Mother     SOCIAL HISTORY:  History   Social History  . Marital Status: Widowed    Spouse Name: N/A    Number of Children: 2  . Years of Education: College   Occupational History  . retired operated [employer]  . Smoking status: Former Smoker    Quit date: 10/18/1983  . Smokeless tobacco: Never Used  . Alcohol Use: No  . Drug Use: No  . Sexual Activity: No   Other Topics Concern  . Not on file   Social History Narrative   Patient is widowed with 2 children.   Patient has a college education.   Patient is right handed.   Patient drinks 2 cups daily.     PHYSICAL EXAM   Filed Vitals:   07/30/14 1257  BP: 151/76  Pulse: 68  Height: 5' 8.75" (1.746 m)  Weight: 185 lb (83.915 kg)    Not recorded    Body mass index is 27.53 kg/(m^2).   Generalized: In no acute distress  Neck: Supple, no carotid bruits   Cardiac:  Regular rate rhythm  Pulmonary: Clear to auscultation bilaterally  Musculoskeletal: No deformity  Neurological examination  Mentation: Alert oriented to time, place, history taking, and causual conversation, Mini-Mental Status Examination is 27 out of 30, he is not oriented to date, place,  Cranial nerve II-XII: Pupils were equal round reactive to light. Extraocular movements were full.  Visual field were full on confrontational test. Bilateral fundi were sharp.  Facial sensation and strength were normal. Hearing was intact to finger rubbing bilaterally. Uvula tongue midline.  Head turning and shoulder shrug and were normal and symmetric.Tongue protrusion into cheek strength was normal.  Motor: Normal tone, bulk and strength.  Sensory: Intact to fine touch, pinprick, preserved vibratory sensation, and proprioception at toes.  Coordination: Normal finger to nose, heel-to-shin bilaterally there was no truncal ataxia  Gait:  mildly limp, cautious  Romberg signs: Negative  Deep tendon reflexes: Brachioradialis 2/2, biceps 2/2, triceps 2/2, patellar 2/2, Achilles 2/2, plantar responses were flexor bilaterally.   DIAGNOSTIC DATA (LABS, IMAGING, TESTING) - I reviewed patient records, labs, notes, testing and imaging myself where available.  Lab Results  Component Value Date   WBC 18.2* 05/01/2013   HGB 13.0* 11/23/2013   HCT 38* 11/23/2013   MCV 91.0 05/01/2013   PLT 170 11/23/2013      Component Value Date/Time   NA 137 06/28/2014   NA 134* 05/01/2013 1000   K 4.3 06/28/2014   CL 101 05/01/2013 1000   CO2 23 05/01/2013 1000   GLUCOSE 154* 05/01/2013 1000   GLUCOSE 121* 01/02/2007 0858   BUN 30* 06/28/2014   BUN 41* 05/01/2013 1000   CREATININE 1.3 06/28/2014   CREATININE 1.43* 05/01/2013 1000   CALCIUM 8.8 05/01/2013 1000   CALCIUM 9.5 06/02/2010 2048   PROT 6.3 05/01/2013 1000   ALBUMIN 3.0* 05/01/2013 1000   AST 23 05/01/2013 1000   ALT 16 05/01/2013 1000   ALKPHOS 41 05/01/2013 1000   BILITOT 1.5*  05/01/2013 1000   GFRNONAA 42* 05/01/2013 1000   GFRAA 49* 05/01/2013 1000   Lab Results  Component Value Date   CHOL 163 03/01/2014   HDL 26* 03/01/2014   LDLCALC 93 03/01/2014   LDLDIRECT 85.6 07/19/2010   TRIG 221* 03/01/2014   CHOLHDL 4 03/06/2011   Lab Results  Component Value Date   HGBA1C 7.0* 06/28/2014   Lab Results  Component Value Date   VITAMINB12 220 06/02/2010   Lab Results  Component Value Date   TSH 2.65 03/06/2011      ASSESSMENT AND PLAN  Zachary Harris is a 80 y.o. male with gradual onset mild memory trouble, Mini-Mental Status 27 out of 30 today,   1, differentiation diagnosis including normal aging, vs. mild cognitive impairment  2. Not a candidate for MRI due to pacemaker, CAT scan of the brain 3. Laboratory evaluations 4. Starting Namenda 14 mg every night  5. May consider Namenda and Aricept next visit in 3 months  Marcial Pacas, M.D. Ph.D.  [facility] [address] [place], [place] [address] [phone]

## 2014-07-31 LAB — CBC
HCT: 43.5 % (ref 37.5–51.0)
HEMOGLOBIN: 14.7 g/dL (ref 12.6–17.7)
MCH: 30.2 pg (ref 26.6–33.0)
MCHC: 33.8 g/dL (ref 31.5–35.7)
MCV: 90 fL (ref 79–97)
Platelets: 164 10*3/uL (ref 150–379)
RBC: 4.86 x10E6/uL (ref 4.14–5.80)
RDW: 13.5 % (ref 12.3–15.4)
WBC: 6.7 10*3/uL (ref 3.4–10.8)

## 2014-07-31 LAB — COMPREHENSIVE METABOLIC PANEL
ALBUMIN: 4.3 g/dL (ref 3.5–4.7)
ALT: 12 IU/L (ref 0–44)
AST: 22 IU/L (ref 0–40)
Albumin/Globulin Ratio: 1.6 (ref 1.1–2.5)
Alkaline Phosphatase: 41 IU/L (ref 39–117)
BUN/Creatinine Ratio: 19 (ref 10–22)
BUN: 26 mg/dL (ref 8–27)
CALCIUM: 9.6 mg/dL (ref 8.6–10.2)
CHLORIDE: 97 mmol/L (ref 97–108)
CO2: 23 mmol/L (ref 18–29)
CREATININE: 1.39 mg/dL — AB (ref 0.76–1.27)
GFR calc Af Amer: 52 mL/min/{1.73_m2} — ABNORMAL LOW (ref >59)
GFR calc non Af Amer: 45 mL/min/{1.73_m2} — ABNORMAL LOW (ref >59)
GLOBULIN, TOTAL: 2.7 g/dL (ref 1.5–4.5)
GLUCOSE: 130 mg/dL — AB (ref 65–99)
Potassium: 4.4 mmol/L (ref 3.5–5.2)
Sodium: 139 mmol/L (ref 134–144)
TOTAL PROTEIN: 7 g/dL (ref 6.0–8.5)
Total Bilirubin: 0.8 mg/dL (ref 0.0–1.2)

## 2014-07-31 LAB — VITAMIN B12: VITAMIN B 12: 307 pg/mL (ref 211–946)

## 2014-07-31 LAB — TSH: TSH: 4.27 u[IU]/mL (ref 0.450–4.500)

## 2014-08-01 ENCOUNTER — Other Ambulatory Visit: Payer: Self-pay | Admitting: Internal Medicine

## 2014-08-02 ENCOUNTER — Telehealth: Payer: Self-pay | Admitting: Neurology

## 2014-08-02 NOTE — Telephone Encounter (Signed)
Patient was given his results and advised to call the office with any questions or concerns.

## 2014-08-02 NOTE — Telephone Encounter (Signed)
Please call patient, laboratory evaluation showed mild elevated glucose, could be related to the timing of the sample, there was also evidence of elevated creatinine, indicating mild abnormal kidney function, rest of the laboratory was normal

## 2014-08-03 ENCOUNTER — Telehealth: Payer: Self-pay

## 2014-08-09 ENCOUNTER — Ambulatory Visit
Admission: RE | Admit: 2014-08-09 | Discharge: 2014-08-09 | Disposition: A | Discharge status: Home / Self Care | Payer: Medicare Other | Source: Ambulatory Visit | Attending: Neurology | Admitting: Neurology

## 2014-08-09 DIAGNOSIS — R413 Other amnesia: Secondary | ICD-10-CM

## 2014-08-12 NOTE — Telephone Encounter (Signed)
I have called his daughter, left message, called patient   CT head showed   1. Moderate frontal, parietal, temporal atrophy. Mild ventriculomegaly on ex vacuo basis.  2. Mild chronic small vessel ischemic disease.  3. Right parietal convexity encephalomalacia, may represent chronic ischemic infarction.  4. No acute findings.  Lab showed normal B12, TSH, mild elevated creat 1.3.  Advise him to keep follow up in Nov 25th 2015.

## 2014-08-13 ENCOUNTER — Telehealth: Payer: Self-pay | Admitting: Neurology

## 2014-08-13 NOTE — Telephone Encounter (Signed)
Daughter would like to speak to Dr. Krista Blue about the patient results she left on voicemail. Please advise.

## 2014-08-13 NOTE — Telephone Encounter (Signed)
I have talked with his daughter,    CT head showed  1. Moderate frontal, parietal, temporal atrophy. Mild ventriculomegaly on ex vacuo basis.  2. Mild chronic small vessel ischemic disease.  3. Right parietal convexity encephalomalacia, may represent chronic ischemic infarction.  4. No acute findings.  Lab showed normal B12, TSH, mild elevated creat 1.3.  Advise him to keep follow up in Nov 25th 2015

## 2014-08-13 NOTE — Telephone Encounter (Signed)
Returning Dr. Rhea Belton call to get her fathers results. Please call

## 2014-08-17 ENCOUNTER — Encounter: Payer: Self-pay | Admitting: Cardiology

## 2014-08-20 ENCOUNTER — Encounter: Payer: Self-pay | Admitting: Internal Medicine

## 2014-09-09 ENCOUNTER — Encounter: Payer: Self-pay | Admitting: Neurology

## 2014-09-09 DIAGNOSIS — R269 Unspecified abnormalities of gait and mobility: Secondary | ICD-10-CM

## 2014-09-09 NOTE — Telephone Encounter (Signed)
Left message for patient daughter

## 2014-09-09 NOTE — Telephone Encounter (Signed)
Hinton Dyer, please try to call his daughter again, I have left message.  I have ordered physical therapy, may do Home Health PT or outpatient PT at her preference.

## 2014-09-09 NOTE — Addendum Note (Signed)
Addended by: Marcial Pacas on: 09/09/2014 12:43 PM   Modules accepted: Orders

## 2014-09-16 ENCOUNTER — Telehealth: Payer: Self-pay | Admitting: Neurology

## 2014-09-16 ENCOUNTER — Encounter: Payer: Self-pay | Admitting: Internal Medicine

## 2014-09-16 NOTE — Telephone Encounter (Signed)
Patient's daughter Zachary Harris calling to check on the status of patient's physical therapy referral, please return call and advise.

## 2014-09-16 NOTE — Telephone Encounter (Signed)
Called and left message order was resent.

## 2014-09-22 NOTE — Telephone Encounter (Signed)
Patient's daughter [name] calling again to state she hasn't received a call about getting patient set up for physical therapy, wants to know where the order was sent to, please return call and advise.

## 2014-09-23 NOTE — Telephone Encounter (Signed)
Called and Spoke with [facility] Patient was called twice and he declined. Patient Daughter was called she is going to try and get him to go.

## 2014-09-25 ENCOUNTER — Other Ambulatory Visit: Payer: Self-pay | Admitting: Internal Medicine

## 2014-09-29 ENCOUNTER — Ambulatory Visit: Payer: Medicare Other | Attending: Neurology | Admitting: Physical Therapy

## 2014-09-29 DIAGNOSIS — I10 Essential (primary) hypertension: Secondary | ICD-10-CM | POA: Diagnosis not present

## 2014-09-29 DIAGNOSIS — Z5189 Encounter for other specified aftercare: Secondary | ICD-10-CM | POA: Diagnosis present

## 2014-09-29 DIAGNOSIS — E119 Type 2 diabetes mellitus without complications: Secondary | ICD-10-CM | POA: Insufficient documentation

## 2014-09-29 DIAGNOSIS — M1990 Unspecified osteoarthritis, unspecified site: Secondary | ICD-10-CM | POA: Insufficient documentation

## 2014-09-29 DIAGNOSIS — R2689 Other abnormalities of gait and mobility: Secondary | ICD-10-CM | POA: Diagnosis not present

## 2014-09-29 DIAGNOSIS — Z96653 Presence of artificial knee joint, bilateral: Secondary | ICD-10-CM | POA: Insufficient documentation

## 2014-09-29 DIAGNOSIS — Z950 Presence of cardiac pacemaker: Secondary | ICD-10-CM | POA: Insufficient documentation

## 2014-09-29 DIAGNOSIS — R269 Unspecified abnormalities of gait and mobility: Secondary | ICD-10-CM | POA: Insufficient documentation

## 2014-09-30 ENCOUNTER — Ambulatory Visit: Payer: Medicare Other

## 2014-10-05 ENCOUNTER — Ambulatory Visit: Payer: Medicare Other

## 2014-10-05 DIAGNOSIS — Z5189 Encounter for other specified aftercare: Secondary | ICD-10-CM | POA: Diagnosis not present

## 2014-10-08 ENCOUNTER — Emergency Department (HOSPITAL_COMMUNITY): Payer: Medicare Other

## 2014-10-08 ENCOUNTER — Telehealth: Payer: Self-pay | Admitting: Cardiology

## 2014-10-08 ENCOUNTER — Encounter (HOSPITAL_COMMUNITY): Payer: Self-pay | Admitting: Emergency Medicine

## 2014-10-08 ENCOUNTER — Other Ambulatory Visit: Payer: Self-pay

## 2014-10-08 ENCOUNTER — Inpatient Hospital Stay (HOSPITAL_COMMUNITY)
Admission: EM | Admit: 2014-10-08 | Discharge: 2014-10-10 | DRG: 292 | Disposition: A | Discharge status: Home / Self Care | Payer: Medicare Other | Attending: Cardiology | Admitting: Cardiology

## 2014-10-08 DIAGNOSIS — I2583 Coronary atherosclerosis due to lipid rich plaque: Secondary | ICD-10-CM

## 2014-10-08 DIAGNOSIS — Z951 Presence of aortocoronary bypass graft: Secondary | ICD-10-CM

## 2014-10-08 DIAGNOSIS — Z79899 Other long term (current) drug therapy: Secondary | ICD-10-CM

## 2014-10-08 DIAGNOSIS — I129 Hypertensive chronic kidney disease with stage 1 through stage 4 chronic kidney disease, or unspecified chronic kidney disease: Secondary | ICD-10-CM | POA: Diagnosis present

## 2014-10-08 DIAGNOSIS — Z9861 Coronary angioplasty status: Secondary | ICD-10-CM

## 2014-10-08 DIAGNOSIS — I5042 Chronic combined systolic (congestive) and diastolic (congestive) heart failure: Principal | ICD-10-CM | POA: Diagnosis present

## 2014-10-08 DIAGNOSIS — N183 Chronic kidney disease, stage 3 (moderate): Secondary | ICD-10-CM

## 2014-10-08 DIAGNOSIS — M1990 Unspecified osteoarthritis, unspecified site: Secondary | ICD-10-CM | POA: Diagnosis present

## 2014-10-08 DIAGNOSIS — Z23 Encounter for immunization: Secondary | ICD-10-CM

## 2014-10-08 DIAGNOSIS — F419 Anxiety disorder, unspecified: Secondary | ICD-10-CM | POA: Diagnosis present

## 2014-10-08 DIAGNOSIS — Z96659 Presence of unspecified artificial knee joint: Secondary | ICD-10-CM | POA: Diagnosis present

## 2014-10-08 DIAGNOSIS — Z6824 Body mass index (BMI) 24.0-24.9, adult: Secondary | ICD-10-CM

## 2014-10-08 DIAGNOSIS — I248 Other forms of acute ischemic heart disease: Secondary | ICD-10-CM

## 2014-10-08 DIAGNOSIS — E785 Hyperlipidemia, unspecified: Secondary | ICD-10-CM | POA: Diagnosis present

## 2014-10-08 DIAGNOSIS — N289 Disorder of kidney and ureter, unspecified: Secondary | ICD-10-CM

## 2014-10-08 DIAGNOSIS — Z86718 Personal history of other venous thrombosis and embolism: Secondary | ICD-10-CM

## 2014-10-08 DIAGNOSIS — I2510 Atherosclerotic heart disease of native coronary artery without angina pectoris: Secondary | ICD-10-CM | POA: Diagnosis present

## 2014-10-08 DIAGNOSIS — N189 Chronic kidney disease, unspecified: Secondary | ICD-10-CM | POA: Diagnosis present

## 2014-10-08 DIAGNOSIS — F329 Major depressive disorder, single episode, unspecified: Secondary | ICD-10-CM | POA: Diagnosis present

## 2014-10-08 DIAGNOSIS — I252 Old myocardial infarction: Secondary | ICD-10-CM

## 2014-10-08 DIAGNOSIS — R0602 Shortness of breath: Secondary | ICD-10-CM

## 2014-10-08 DIAGNOSIS — R635 Abnormal weight gain: Secondary | ICD-10-CM

## 2014-10-08 DIAGNOSIS — E440 Moderate protein-calorie malnutrition: Secondary | ICD-10-CM | POA: Diagnosis present

## 2014-10-08 DIAGNOSIS — M810 Age-related osteoporosis without current pathological fracture: Secondary | ICD-10-CM | POA: Diagnosis present

## 2014-10-08 DIAGNOSIS — I509 Heart failure, unspecified: Secondary | ICD-10-CM

## 2014-10-08 DIAGNOSIS — I10 Essential (primary) hypertension: Secondary | ICD-10-CM | POA: Diagnosis present

## 2014-10-08 DIAGNOSIS — H9190 Unspecified hearing loss, unspecified ear: Secondary | ICD-10-CM | POA: Diagnosis present

## 2014-10-08 DIAGNOSIS — K219 Gastro-esophageal reflux disease without esophagitis: Secondary | ICD-10-CM | POA: Diagnosis present

## 2014-10-08 DIAGNOSIS — Z87891 Personal history of nicotine dependence: Secondary | ICD-10-CM

## 2014-10-08 DIAGNOSIS — Z7982 Long term (current) use of aspirin: Secondary | ICD-10-CM

## 2014-10-08 DIAGNOSIS — I214 Non-ST elevation (NSTEMI) myocardial infarction: Secondary | ICD-10-CM

## 2014-10-08 DIAGNOSIS — E1151 Type 2 diabetes mellitus with diabetic peripheral angiopathy without gangrene: Secondary | ICD-10-CM | POA: Diagnosis present

## 2014-10-08 DIAGNOSIS — Z8546 Personal history of malignant neoplasm of prostate: Secondary | ICD-10-CM

## 2014-10-08 DIAGNOSIS — Z950 Presence of cardiac pacemaker: Secondary | ICD-10-CM

## 2014-10-08 DIAGNOSIS — I255 Ischemic cardiomyopathy: Secondary | ICD-10-CM | POA: Diagnosis present

## 2014-10-08 DIAGNOSIS — I739 Peripheral vascular disease, unspecified: Secondary | ICD-10-CM | POA: Diagnosis present

## 2014-10-08 DIAGNOSIS — N400 Enlarged prostate without lower urinary tract symptoms: Secondary | ICD-10-CM | POA: Diagnosis present

## 2014-10-08 HISTORY — DX: Polyneuropathy, unspecified: G62.9

## 2014-10-08 HISTORY — DX: Ischemic cardiomyopathy: I25.5

## 2014-10-08 LAB — CREATININE, SERUM
CREATININE: 1.55 mg/dL — AB (ref 0.50–1.35)
GFR calc Af Amer: 44 mL/min — ABNORMAL LOW (ref >90)
GFR calc non Af Amer: 38 mL/min — ABNORMAL LOW (ref >90)

## 2014-10-08 LAB — CBC
HCT: 44.1 % (ref 39.0–52.0)
HCT: 43.6 % (ref 39.0–52.0)
Hemoglobin: 14.7 g/dL (ref 13.0–17.0)
Hemoglobin: 14.9 g/dL (ref 13.0–17.0)
MCH: 29.9 pg (ref 26.0–34.0)
MCH: 30.5 pg (ref 26.0–34.0)
MCHC: 33.3 g/dL (ref 30.0–36.0)
MCHC: 34.2 g/dL (ref 30.0–36.0)
MCV: 89.6 fL (ref 78.0–100.0)
MCV: 89.3 fL (ref 78.0–100.0)
PLATELETS: 172 10*3/uL (ref 150–400)
PLATELETS: 163 10*3/uL (ref 150–400)
RBC: 4.92 MIL/uL (ref 4.22–5.81)
RBC: 4.88 MIL/uL (ref 4.22–5.81)
RDW: 14.1 % (ref 11.5–15.5)
RDW: 14 % (ref 11.5–15.5)
WBC: 5.2 10*3/uL (ref 4.0–10.5)
WBC: 5.4 10*3/uL (ref 4.0–10.5)

## 2014-10-08 LAB — TROPONIN I: Troponin I: <0.3 ng/mL (ref <0.30)

## 2014-10-08 LAB — COMPREHENSIVE METABOLIC PANEL
ALT: 11 U/L (ref 0–53)
ANION GAP: 16 — AB (ref 5–15)
AST: 25 U/L (ref 0–37)
Albumin: 3.6 g/dL (ref 3.5–5.2)
Alkaline Phosphatase: 44 U/L (ref 39–117)
BILIRUBIN TOTAL: 0.9 mg/dL (ref 0.3–1.2)
BUN: 39 mg/dL — AB (ref 6–23)
CALCIUM: 9.5 mg/dL (ref 8.4–10.5)
CHLORIDE: 102 meq/L (ref 96–112)
CO2: 21 meq/L (ref 19–32)
Creatinine, Ser: 1.48 mg/dL — ABNORMAL HIGH (ref 0.50–1.35)
GFR, EST AFRICAN AMERICAN: 47 mL/min — AB (ref >90)
GFR, EST NON AFRICAN AMERICAN: 40 mL/min — AB (ref >90)
Glucose, Bld: 157 mg/dL — ABNORMAL HIGH (ref 70–99)
Potassium: 4.1 mEq/L (ref 3.7–5.3)
Sodium: 139 mEq/L (ref 137–147)
Total Protein: 7.3 g/dL (ref 6.0–8.3)

## 2014-10-08 LAB — I-STAT TROPONIN, ED: Troponin i, poc: 0.1 ng/mL (ref 0.00–0.08)

## 2014-10-08 LAB — PRO B NATRIURETIC PEPTIDE: Pro B Natriuretic peptide (BNP): 678.3 pg/mL — ABNORMAL HIGH (ref 0–450)

## 2014-10-08 LAB — GLUCOSE, CAPILLARY
Glucose-Capillary: 114 mg/dL — ABNORMAL HIGH (ref 70–99)
Glucose-Capillary: 155 mg/dL — ABNORMAL HIGH (ref 70–99)

## 2014-10-08 LAB — MAGNESIUM: Magnesium: 2 mg/dL (ref 1.5–2.5)

## 2014-10-08 LAB — TSH: TSH: 3.83 u[IU]/mL (ref 0.350–4.500)

## 2014-10-08 MED ORDER — ACETAMINOPHEN 325 MG PO TABS: 650.0000 mg | ORAL_TABLET | ORAL | Status: DC | PRN

## 2014-10-08 MED ORDER — ASPIRIN 81 MG PO CHEW
324.0000 mg | CHEWABLE_TABLET | Freq: Once | ORAL | Status: AC
  Administered 2014-10-08: 324 mg via ORAL
  Filled 2014-10-08: qty 4

## 2014-10-08 MED ORDER — NITROGLYCERIN 0.4 MG SL SUBL: 0.4000 mg | SUBLINGUAL_TABLET | SUBLINGUAL | Status: DC | PRN

## 2014-10-08 MED ORDER — MEMANTINE HCL ER 28 MG PO CP24
28.0000 mg | ORAL_CAPSULE | Freq: Every day | ORAL | Status: DC
  Administered 2014-10-08 – 2014-10-09 (×2): 28 mg via ORAL
  Filled 2014-10-08 (×3): qty 28

## 2014-10-08 MED ORDER — MEMANTINE HCL ER 14 MG PO CP24: 28.0000 mg | ORAL_CAPSULE | Freq: Every day | ORAL | Status: DC

## 2014-10-08 MED ORDER — TAMSULOSIN HCL 0.4 MG PO CAPS
0.4000 mg | ORAL_CAPSULE | Freq: Every day | ORAL | Status: DC
  Administered 2014-10-09 – 2014-10-10 (×2): 0.4 mg via ORAL
  Filled 2014-10-08 (×3): qty 1

## 2014-10-08 MED ORDER — HEPARIN SODIUM (PORCINE) 5000 UNIT/ML IJ SOLN
5000.0000 [IU] | Freq: Three times a day (TID) | INTRAMUSCULAR | Status: DC
  Administered 2014-10-08 – 2014-10-10 (×5): 5000 [IU] via SUBCUTANEOUS
  Filled 2014-10-08 (×8): qty 1

## 2014-10-08 MED ORDER — SODIUM CHLORIDE 0.9 % IJ SOLN
3.0000 mL | Freq: Two times a day (BID) | INTRAMUSCULAR | Status: DC
  Administered 2014-10-08 – 2014-10-10 (×5): 3 mL via INTRAVENOUS

## 2014-10-08 MED ORDER — DARIFENACIN HYDROBROMIDE ER 15 MG PO TB24
15.0000 mg | ORAL_TABLET | Freq: Every day | ORAL | Status: DC
  Administered 2014-10-09 – 2014-10-10 (×2): 15 mg via ORAL
  Filled 2014-10-08 (×2): qty 1

## 2014-10-08 MED ORDER — ATORVASTATIN CALCIUM 40 MG PO TABS
40.0000 mg | ORAL_TABLET | Freq: Every day | ORAL | Status: DC
  Administered 2014-10-09 – 2014-10-10 (×2): 40 mg via ORAL
  Filled 2014-10-08 (×2): qty 1

## 2014-10-08 MED ORDER — FUROSEMIDE 10 MG/ML IJ SOLN
40.0000 mg | Freq: Two times a day (BID) | INTRAMUSCULAR | Status: DC
  Administered 2014-10-09: 40 mg via INTRAVENOUS
  Filled 2014-10-08 (×4): qty 4

## 2014-10-08 MED ORDER — FENOFIBRATE 160 MG PO TABS
160.0000 mg | ORAL_TABLET | Freq: Every day | ORAL | Status: DC
Start: 2014-10-09 — End: 2014-10-10
  Administered 2014-10-09 – 2014-10-10 (×2): 160 mg via ORAL
  Filled 2014-10-08 (×2): qty 1

## 2014-10-08 MED ORDER — SODIUM CHLORIDE 0.9 % IV SOLN: 250.0000 mL | INTRAVENOUS | Status: DC | PRN

## 2014-10-08 MED ORDER — ALENDRONATE SODIUM 70 MG PO TABS: 70.0000 mg | ORAL_TABLET | ORAL | Status: DC

## 2014-10-08 MED ORDER — FUROSEMIDE 10 MG/ML IJ SOLN
40.0000 mg | Freq: Once | INTRAMUSCULAR | Status: AC
  Administered 2014-10-08: 40 mg via INTRAVENOUS
  Filled 2014-10-08: qty 4

## 2014-10-08 MED ORDER — INSULIN ASPART 100 UNIT/ML ~~LOC~~ SOLN
0.0000 [IU] | Freq: Three times a day (TID) | SUBCUTANEOUS | Status: DC
  Administered 2014-10-09: 2 [IU] via SUBCUTANEOUS
  Administered 2014-10-09: 3 [IU] via SUBCUTANEOUS
  Administered 2014-10-10: 11:00:00 via SUBCUTANEOUS
  Administered 2014-10-10: 2 [IU] via SUBCUTANEOUS

## 2014-10-08 MED ORDER — ONDANSETRON HCL 4 MG/2ML IJ SOLN: 4.0000 mg | Freq: Four times a day (QID) | INTRAMUSCULAR | Status: DC | PRN

## 2014-10-08 MED ORDER — INFLUENZA VAC SPLIT QUAD 0.5 ML IM SUSY
0.5000 mL | PREFILLED_SYRINGE | INTRAMUSCULAR | Status: AC
  Administered 2014-10-09: 0.5 mL via INTRAMUSCULAR
  Filled 2014-10-08: qty 0.5

## 2014-10-08 MED ORDER — ASPIRIN EC 81 MG PO TBEC
81.0000 mg | DELAYED_RELEASE_TABLET | Freq: Every day | ORAL | Status: DC
  Administered 2014-10-08 – 2014-10-09 (×2): 81 mg via ORAL
  Filled 2014-10-08 (×3): qty 1

## 2014-10-08 MED ORDER — SODIUM CHLORIDE 0.9 % IJ SOLN
3.0000 mL | INTRAMUSCULAR | Status: DC | PRN
  Administered 2014-10-09: 3 mL via INTRAVENOUS

## 2014-10-08 NOTE — ED Notes (Signed)
Sob x 2 weeks while he is walking denies cp  Or cough or ankles swwelling

## 2014-10-08 NOTE — Telephone Encounter (Signed)
Received call from patient's daughter [name] she stated she is concerned about her father.Stated he has been sob for the past 2 weeks and it is getting worse.Stated he can only walk a few feet has to sit down labored breathing.No chest pain,no swelling.Advised she needs to take him to [facility] ER.Trish called.

## 2014-10-08 NOTE — Telephone Encounter (Signed)
New Message     Pt's daughter calling and states pt is c/o sob after taking a couple of steps, having a very hard time breathing. Pt's daughter is needing guidance on whether he needs to go to the ER.

## 2014-10-08 NOTE — ED Notes (Signed)
Cardiologist at the bedside

## 2014-10-08 NOTE — ED Notes (Signed)
Lab results reported to Dr.McManus.

## 2014-10-08 NOTE — ED Notes (Signed)
Attempted Report x1.   

## 2014-10-08 NOTE — H&P (Signed)
Patient ID: MAXIMUM REILAND MRN: [id number], DOB/AGE: 08-28-1923   Admit date: 10/08/2014   Primary Physician: Estill Dooms, MD Primary Cardiologist: Dr. Stanford Breed / Dr. Caryl Comes   Pt. Profile:  Zachary Harris is a 80 y.o. male with a history of CAD s/p CABG 1997, PCI of SVG to OM (2007), AV block with syncope s/p St. Jude PPM, HLD, HTN, PVD, DVT historically on coumadin and CKD who presented to [facility] ED with worsening SOB.   He had an inferior MI in 11/07 which revealed severe 3 vessel disease; occluded SVG to RCA; SVG to OM subtotalled, SVG to diagonal with ostial 70 and 80 mid; EF 45% He underwent a PCI of SVG to OM at that time. FU myovue showed EF 56% with inferior infarct and very mild peri-infarct ischemia. Echo 9/09 showed EF 55% with inferior HK, mild LAE and mild MR. ABI 5/12-left mildly abnormal; normal right. Carotid dopplers 12/14 showed 1-39% bilateral stenosis.   The patient is in no apparent distress and only presented to the ED at his families urging. His daughter and her husband noticed that he was significantly more SOB while walking to the car yesterday and the day before. He apparently had to stop multiple times from the restaurant to the car and it took him a while to recuperate once sitting in the car. This is new for him and when they asked him how long it had been going on he reported he had increased SOB with exertion for about 2 weeks. He denies orthopnea, PND, LE edema, weight gain, chest pain or palpitations. No lightheadedness or dizziness. No fevers, chills or night sweats. He generally feels the same. His daughter reported that he had chest discomfort prior to his previous cardiac events.     Problem List  Past Medical History  Diagnosis Date  . ANEMIA-IRON DEFICIENCY 05/27/2007  . ANXIETY 11/14/2007  . Intermittent high-grade heart block 09/05/2009  . BENIGN PROSTATIC HYPERTROPHY 11/14/2007  . CAROTID ARTERY DISEASE 10/02/2010  . Pacemaker-St. Jude  02/02/2008    St. Jude  . COLONIC POLYPS, HX OF 05/27/2007  . CORONARY ARTERY DISEASE 11/14/2007    prior bypass surgery; ejection fraction 40-45% 2012  . DEPRESSION 11/14/2007  . DIABETES MELLITUS, TYPE II 11/14/2007  . DIVERTICULITIS, HX OF 05/27/2007  . DVT (deep venous thrombosis) 03/21/2011  . Edema 01/15/2011  . FOOT PAIN, BILATERAL 12/08/2010  . GERD 11/14/2007  . GROIN PAIN 09/24/2008  . HYPERLIPIDEMIA 11/14/2007  . HYPERTENSION 11/14/2007  . HYPOTENSION, ORTHOSTATIC 03/06/2011  . Long term current use of anticoagulant 03/21/2011  . Memory loss 06/01/2008  . OSTEOPOROSIS 06/02/2010  . PERIPHERAL VASCULAR DISEASE 11/14/2007  . RASH-NONVESICULAR 01/22/2008  . RENAL INSUFFICIENCY 11/14/2007  . SECONDARY DM W/PERIPHERAL CIRC D/O UNCONTROLLED 11/07/2010  . SYNCOPE 09/08/2008  . Unspecified hearing loss 05/13/2009  . VERTEBRAL FRACTURE 09/05/2009  . Prostate cancer   . Acute cystitis   . Myocardial infarction   . Seborrheic keratosis 06/05/2011  . Chronic pain 04/24/2011  . Unspecified hereditary and idiopathic peripheral neuropathy 04/24/2011  . Myalgia and myositis, unspecified 04/24/2011  . Other malaise and fatigue 04/24/2011  . Abnormality of gait 04/24/2011  . Type II or unspecified type diabetes mellitus with neurological manifestations, uncontrolled 03/27/2011  . Urinary frequency 03/27/2011  . Unspecified disorder of kidney and ureter 03/23/2011  . Osteoarthrosis, unspecified whether generalized or localized, unspecified site 03/23/2011  . Seborrheic keratosis 06/05/2011  . Chronic pain 04/24/2011  . Unspecified hereditary  and idiopathic peripheral neuropathy 04/24/2011  . Malignant neoplasm of prostate 03/23/2011  . Conduction disorder, unspecified 03/23/2011  . Phlebitis and thrombophlebitis of other deep vessels of lower extremities 03/23/2011  . Osteoarthrosis, unspecified whether generalized or localized, unspecified site 03/23/2011  . Syncope and collapse 03/13/2011    Past Surgical History    Procedure Laterality Date  . S/p coronary stent x 1    . Cholecystectomy    . S/p bilat knee replacement  Cleon Gustin, MD  . Pacemaker insertion  2012    Caryl Comes, MD  . Prostate needle biopsy    . Cystoscopy    . Appendectomy    . Coronary artery bypass graft  2007     Allergies  No Known Allergies   Home Medications  Prior to Admission medications   Medication Sig Start Date End Date Taking? Authorizing Provider  alendronate (FOSAMAX) 70 MG tablet Take 70 mg by mouth every Saturday. Take with a full glass of water on an empty stomach.   Yes Historical Provider, MD  aspirin EC 81 MG tablet Take 81 mg by mouth at bedtime. 04/19/14  Yes Lelon Perla, MD  atorvastatin (LIPITOR) 40 MG tablet Take 40 mg by mouth daily. To control cholesterol   Yes Historical Provider, MD  fenofibrate 160 MG tablet Take 160 mg by mouth daily. To control cholesterol   Yes Historical Provider, MD  furosemide (LASIX) 20 MG tablet Take 20 mg by mouth daily.  06/29/14  Yes Historical Provider, MD  Memantine HCl ER 14 MG CP24 Take 28 mg by mouth at bedtime.   Yes Historical Provider, MD  metFORMIN (GLUCOPHAGE) 500 MG tablet One each morning to control glucose 03/02/14  Yes Estill Dooms, MD  metFORMIN (GLUCOPHAGE) 500 MG tablet Take 500 mg by mouth daily with breakfast. To control glucose   Yes Historical Provider, MD  solifenacin (VESICARE) 10 MG tablet Take 10 mg by mouth daily. To decrease urinary urgency   Yes Historical Provider, MD  tamsulosin (FLOMAX) 0.4 MG CAPS capsule Take 0.4 mg by mouth daily after breakfast. For bladder 07/14/13  Yes Historical Provider, MD  nitroGLYCERIN (NITROSTAT) 0.4 MG SL tablet Place 0.4 mg under the tongue every 5 (five) minutes as needed for chest pain.    Historical Provider, MD    Family History  Family History  Problem Relation Age of Onset  . Diabetes Father     92  . Cancer Mother    Family Status  Relation Status Death Age  . Father Deceased   . Mother  Deceased   . Son Alive   . Daughter Alive   . Sister Alive   . Brother Alive   . Brother Alive   . Brother Alive   . Sister Alive   . Sister Alive   . Brother Deceased 78    accident     Social History  History   Social History  . Marital Status: Widowed    Spouse Name: N/A    Number of Children: 2  . Years of Education: College   Occupational History  . retired operated [employer]  . Smoking status: Former Smoker    Quit date: 10/18/1983  . Smokeless tobacco: Never Used  . Alcohol Use: No  . Drug Use: No  . Sexual Activity: No   Other Topics Concern  . Not on file   Social History Narrative   Patient is  widowed with 2 children.   Patient has a college education.   Patient is right handed.   Patient drinks 2 cups daily.     All other systems reviewed and are otherwise negative except as noted above.  Physical Exam  Blood pressure 134/63, pulse 69, temperature 98.4 F (36.9 C), temperature source Oral, resp. rate 16, SpO2 95.00%.  General: Pleasant, NAD Psych: Normal affect. Neuro: Alert and oriented X 3. Moves all extremities spontaneously. HEENT: Normal  Neck: Supple without bruits or JVD. Lungs:  Resp regular and unlabored, diffuse expiratory rhonchi Heart: RRR no s3, s4, or murmurs. Abdomen: Soft, non-tender, non-distended, BS + x 4.  Extremities: No clubbing, cyanosis or edema. DP/PT/Radials 2+ and equal bilaterally.  Labs  No results found for this basename: CKTOTAL, CKMB, TROPONINI,  in the last 72 hours Lab Results  Component Value Date   WBC 5.2 10/08/2014   HGB 14.7 10/08/2014   HCT 44.1 10/08/2014   MCV 89.6 10/08/2014   PLT 172 10/08/2014    Recent Labs Lab 10/08/14 1021  NA 139  K 4.1  CL 102  CO2 21  BUN 39*  CREATININE 1.48*  CALCIUM 9.5  PROT 7.3  BILITOT 0.9  ALKPHOS 44  ALT 11  AST 25  GLUCOSE 157*   Lab Results  Component Value Date   CHOL 163 03/01/2014   HDL 26*  03/01/2014   LDLCALC 93 03/01/2014   TRIG 221* 03/01/2014      Radiology/Studies  Dg Chest 2 View  10/08/2014   CLINICAL DATA:  Shortness of breath  EXAM: CHEST  2 VIEW  COMPARISON:  05/01/2013  FINDINGS: Post CABG. Left pacer remains in place, unchanged. Heart is normal size. No confluent airspace opacities, effusions or edema. No acute bony abnormality. Degenerative changes in the thoracic spine and shoulders.  IMPRESSION: No active cardiopulmonary disease.     ECG HR 73 Sinus rhythm with 1st degree A-V block, LAD, LVH.  Left ventricular hypertrophy with repolarization abnormality Inferior infarct , age undetermined Nonspecific ST and T wave abnormality Anterior leads Abnormal ECG  ASSESSMENT AND PLAN Zachary Harris is a 80 y.o. male with a history of CAD s/p CABG 1997, PCI of SVG to OM (2007), AV block with syncope s/p St. Jude PPM, HLD, HTN, DM, PVD, DVT historically on coumadin and CKD who presented to [facility] ED with worsening SOB.   Increased DOE-  -- POC troponin mildly elevated at 0.10. Likely demand ischemic in the setting of mild, acute diastolic CHF. Will continue to cycle -- BNP mildly elevated at 678. CXR with no CHF -- ECG with some non-specific ST and T wave abnormality anterior leads -- Plan to admit for observation and cycle enzymes/ serial ECGs -- Continue ASA, statin -- Will start 40mg  IV Lasix BID  -- Will repeat ECHO  CKD- creat 1.48 ( baseline 1.2-1.4)  HTN- blood pressure well controlled on no medications   DM- hold metformin in the case he needs a cardiac cath  -- SSI   Judy Pimple, PA-C 10/08/2014, 12:19 PM  Pager [phone]

## 2014-10-08 NOTE — ED Provider Notes (Signed)
CSN: [id number]     Arrival date & time 10/08/14  1011 History   First MD Initiated Contact with Patient 10/08/14 1043     Chief Complaint  Patient presents with  . Shortness of Breath      HPI Pt was seen at 1050. Per pt and his family, c/o gradual onset and worsening of persistent SOB for the past 2 weeks. SOB worsens on exertion. Pt's family states pt has been walking shorter distances than his usual and "has to stop and rest a lot to catch his breath." Pt called his Cards MD PTA, and was sent to the ED for further evaluation. Denies CP/palpitations, no cough, no peripheral edema, no abd pain, no N/V/D, no back pain, no fevers.    Past Medical History  Diagnosis Date  . ANEMIA-IRON DEFICIENCY 05/27/2007  . ANXIETY 11/14/2007  . Intermittent high-grade heart block 09/05/2009  . BENIGN PROSTATIC HYPERTROPHY 11/14/2007  . CAROTID ARTERY DISEASE 10/02/2010  . Pacemaker-St. Jude 02/02/2008    St. Jude  . COLONIC POLYPS, HX OF 05/27/2007  . CORONARY ARTERY DISEASE 11/14/2007    prior bypass surgery; ejection fraction 40-45% 2012  . DEPRESSION 11/14/2007  . DIABETES MELLITUS, TYPE II 11/14/2007  . DIVERTICULITIS, HX OF 05/27/2007  . DVT (deep venous thrombosis) 03/21/2011  . Edema 01/15/2011  . FOOT PAIN, BILATERAL 12/08/2010  . GERD 11/14/2007  . GROIN PAIN 09/24/2008  . HYPERLIPIDEMIA 11/14/2007  . HYPERTENSION 11/14/2007  . HYPOTENSION, ORTHOSTATIC 03/06/2011  . Long term current use of anticoagulant 03/21/2011  . Memory loss 06/01/2008  . OSTEOPOROSIS 06/02/2010  . PERIPHERAL VASCULAR DISEASE 11/14/2007  . RASH-NONVESICULAR 01/22/2008  . RENAL INSUFFICIENCY 11/14/2007  . SECONDARY DM W/PERIPHERAL CIRC D/O UNCONTROLLED 11/07/2010  . SYNCOPE 09/08/2008  . Unspecified hearing loss 05/13/2009  . VERTEBRAL FRACTURE 09/05/2009  . Prostate cancer   . Acute cystitis   . Myocardial infarction   . Seborrheic keratosis 06/05/2011  . Chronic pain 04/24/2011  . Unspecified hereditary and idiopathic  peripheral neuropathy 04/24/2011  . Myalgia and myositis, unspecified 04/24/2011  . Other malaise and fatigue 04/24/2011  . Abnormality of gait 04/24/2011  . Type II or unspecified type diabetes mellitus with neurological manifestations, uncontrolled 03/27/2011  . Urinary frequency 03/27/2011  . Unspecified disorder of kidney and ureter 03/23/2011  . Osteoarthrosis, unspecified whether generalized or localized, unspecified site 03/23/2011  . Seborrheic keratosis 06/05/2011  . Chronic pain 04/24/2011  . Unspecified hereditary and idiopathic peripheral neuropathy 04/24/2011  . Malignant neoplasm of prostate 03/23/2011  . Conduction disorder, unspecified 03/23/2011  . Phlebitis and thrombophlebitis of other deep vessels of lower extremities 03/23/2011  . Osteoarthrosis, unspecified whether generalized or localized, unspecified site 03/23/2011  . Syncope and collapse 03/13/2011   Past Surgical History  Procedure Laterality Date  . S/p coronary stent x 1    . Cholecystectomy    . S/p bilat knee replacement  Cleon Gustin, MD  . Pacemaker insertion  2012    Caryl Comes, MD  . Prostate needle biopsy    . Cystoscopy    . Appendectomy    . Coronary artery bypass graft  2007   Family History  Problem Relation Age of Onset  . Diabetes Father     46  . Cancer Mother    History  Substance Use Topics  . Smoking status: Former Smoker    Quit date: 10/18/1983  . Smokeless tobacco: Never Used  . Alcohol Use: No    Review of Systems ROS:  Statement: All systems negative except as marked or noted in the HPI; Constitutional: Negative for fever and chills. ; ; Eyes: Negative for eye pain, redness and discharge. ; ; ENMT: Negative for ear pain, hoarseness, nasal congestion, sinus pressure and sore throat. ; ; Cardiovascular: +SOB, DOE. Negative for chest pain, palpitations, diaphoresis, and peripheral edema. ; ; Respiratory: Negative for cough, wheezing and stridor. ; ; Gastrointestinal: Negative for nausea, vomiting,  diarrhea, abdominal pain, blood in stool, hematemesis, jaundice and rectal bleeding. . ; ; Genitourinary: Negative for dysuria, flank pain and hematuria. ; ; Musculoskeletal: Negative for back pain and neck pain. Negative for swelling and trauma.; ; Skin: Negative for pruritus, rash, abrasions, blisters, bruising and skin lesion.; ; Neuro: Negative for headache, lightheadedness and neck stiffness. Negative for weakness, altered level of consciousness , altered mental status, extremity weakness, paresthesias, involuntary movement, seizure and syncope.     Allergies  Review of patient's allergies indicates no known allergies.  Home Medications   Prior to Admission medications   Medication Sig Start Date End Date Taking? Authorizing Provider  alendronate (FOSAMAX) 70 MG tablet Take 70 mg by mouth every Saturday. Take with a full glass of water on an empty stomach.   Yes Historical Provider, MD  aspirin EC 81 MG tablet Take 81 mg by mouth at bedtime. 04/19/14  Yes Lelon Perla, MD  atorvastatin (LIPITOR) 40 MG tablet Take 40 mg by mouth daily. To control cholesterol   Yes Historical Provider, MD  fenofibrate 160 MG tablet Take 160 mg by mouth daily. To control cholesterol   Yes Historical Provider, MD  furosemide (LASIX) 20 MG tablet Take 20 mg by mouth daily.  06/29/14  Yes Historical Provider, MD  Memantine HCl ER 14 MG CP24 Take 28 mg by mouth at bedtime.   Yes Historical Provider, MD  metFORMIN (GLUCOPHAGE) 500 MG tablet One each morning to control glucose 03/02/14  Yes Estill Dooms, MD  metFORMIN (GLUCOPHAGE) 500 MG tablet Take 500 mg by mouth daily with breakfast. To control glucose   Yes Historical Provider, MD  solifenacin (VESICARE) 10 MG tablet Take 10 mg by mouth daily. To decrease urinary urgency   Yes Historical Provider, MD  tamsulosin (FLOMAX) 0.4 MG CAPS capsule Take 0.4 mg by mouth daily after breakfast. For bladder 07/14/13  Yes Historical Provider, MD  nitroGLYCERIN (NITROSTAT)  0.4 MG SL tablet Place 0.4 mg under the tongue every 5 (five) minutes as needed for chest pain.    Historical Provider, MD   BP 126/60  Pulse 71  Temp(Src) 98.4 F (36.9 C) (Oral)  Resp 18  SpO2 100% Physical Exam 1055: Physical examination:  Nursing notes reviewed; Vital signs and O2 SAT reviewed;  Constitutional: Well developed, Well nourished, Well hydrated, In no acute distress; Head:  Normocephalic, atraumatic; Eyes: EOMI, PERRL, No scleral icterus; ENMT: Mouth and pharynx normal, Mucous membranes moist; Neck: Supple, Full range of motion, No lymphadenopathy; Cardiovascular: Regular rate and rhythm, No gallop; Respiratory: Breath sounds clear & equal bilaterally, No wheezes.  Speaking full sentences with ease, Normal respiratory effort/excursion; Chest: Nontender, Movement normal; Abdomen: Soft, Nontender, Nondistended, Normal bowel sounds; Genitourinary: No CVA tenderness; Extremities: Pulses normal, No tenderness, No edema, No calf edema or asymmetry.; Neuro: Awake, alert, vague historian. Major CN grossly intact.  Speech clear. No gross focal motor or sensory deficits in extremities.; Skin: Color normal, Warm, Dry.   ED Course  Procedures     EKG Interpretation   Date/Time:  Friday October 08 2014 10:15:30 EDT Ventricular Rate:  73 PR Interval:  278 QRS Duration: 106 QT Interval:  380 QTC Calculation: 418 R Axis:   -39 Text Interpretation:  Sinus rhythm with 1st degree A-V block Left axis  deviation Left ventricular hypertrophy with repolarization abnormality  Inferior infarct , age undetermined Nonspecific ST and T wave abnormality  Anterior leads Abnormal ECG When compared with ECG of 05/01/2013 Nonspecific  T wave abnormality Anterior leads is now Present Confirmed by [facility]   MD, Nunzio Cory [phone]) on 10/08/2014 11:43:49 AM      MDM  MDM Reviewed: previous chart, nursing note and vitals Reviewed previous: labs and ECG Interpretation: labs, ECG and x-ray   Results  for orders placed during the hospital encounter of 10/08/14  CBC      Result Value Ref Range   WBC 5.2  4.0 - 10.5 K/uL   RBC 4.92  4.22 - 5.81 MIL/uL   Hemoglobin 14.7  13.0 - 17.0 g/dL   HCT 44.1  39.0 - 52.0 %   MCV 89.6  78.0 - 100.0 fL   MCH 29.9  26.0 - 34.0 pg   MCHC 33.3  30.0 - 36.0 g/dL   RDW 14.1  11.5 - 15.5 %   Platelets 172  150 - 400 K/uL  COMPREHENSIVE METABOLIC PANEL      Result Value Ref Range   Sodium 139  137 - 147 mEq/L   Potassium 4.1  3.7 - 5.3 mEq/L   Chloride 102  96 - 112 mEq/L   CO2 21  19 - 32 mEq/L   Glucose, Bld 157 (*) 70 - 99 mg/dL   BUN 39 (*) 6 - 23 mg/dL   Creatinine, Ser 1.48 (*) 0.50 - 1.35 mg/dL   Calcium 9.5  8.4 - 10.5 mg/dL   Total Protein 7.3  6.0 - 8.3 g/dL   Albumin 3.6  3.5 - 5.2 g/dL   AST 25  0 - 37 U/L   ALT 11  0 - 53 U/L   Alkaline Phosphatase 44  39 - 117 U/L   Total Bilirubin 0.9  0.3 - 1.2 mg/dL   GFR calc non Af Amer 40 (*) >90 mL/min   GFR calc Af Amer 47 (*) >90 mL/min   Anion gap 16 (*) 5 - 15  PRO B NATRIURETIC PEPTIDE      Result Value Ref Range   Pro B Natriuretic peptide (BNP) 678.3 (*) 0 - 450 pg/mL  I-STAT TROPOININ, ED      Result Value Ref Range   Troponin i, poc 0.10 (*) 0.00 - 0.08 ng/mL   Comment NOTIFIED PHYSICIAN     Comment 3            Dg Chest 2 View 10/08/2014   CLINICAL DATA:  Shortness of breath  EXAM: CHEST  2 VIEW  COMPARISON:  05/01/2013  FINDINGS: Post CABG. Left pacer remains in place, unchanged. Heart is normal size. No confluent airspace opacities, effusions or edema. No acute bony abnormality. Degenerative changes in the thoracic spine and shoulders.  IMPRESSION: No active cardiopulmonary disease.   Electronically Signed   By: Rolm Baptise M.D.   On: 10/08/2014 12:00    1155:  Pt continues to deny CP. No SOB while sitting on stretcher. BNP elevated compared to previous, will dose IV lasix. BUN/Cr near baseline. ASA given for elevated troponin. Dx and testing d/w pt and family.  Questions  answered.  Verb understanding, agreeable to admit.   T/C  to Cardiology, case discussed, including:  HPI, pertinent PM/SHx, VS/PE, dx testing, ED course and treatment:  Agreeable to come to ED for evaluation to admit.   Francine Graven, DO 10/08/14 2034

## 2014-10-09 DIAGNOSIS — F419 Anxiety disorder, unspecified: Secondary | ICD-10-CM | POA: Diagnosis present

## 2014-10-09 DIAGNOSIS — Z950 Presence of cardiac pacemaker: Secondary | ICD-10-CM

## 2014-10-09 DIAGNOSIS — M810 Age-related osteoporosis without current pathological fracture: Secondary | ICD-10-CM | POA: Diagnosis present

## 2014-10-09 DIAGNOSIS — I2510 Atherosclerotic heart disease of native coronary artery without angina pectoris: Secondary | ICD-10-CM | POA: Diagnosis present

## 2014-10-09 DIAGNOSIS — R0602 Shortness of breath: Secondary | ICD-10-CM | POA: Diagnosis present

## 2014-10-09 DIAGNOSIS — F329 Major depressive disorder, single episode, unspecified: Secondary | ICD-10-CM | POA: Diagnosis present

## 2014-10-09 DIAGNOSIS — Z951 Presence of aortocoronary bypass graft: Secondary | ICD-10-CM | POA: Diagnosis not present

## 2014-10-09 DIAGNOSIS — E440 Moderate protein-calorie malnutrition: Secondary | ICD-10-CM | POA: Diagnosis present

## 2014-10-09 DIAGNOSIS — E1151 Type 2 diabetes mellitus with diabetic peripheral angiopathy without gangrene: Secondary | ICD-10-CM | POA: Diagnosis present

## 2014-10-09 DIAGNOSIS — I214 Non-ST elevation (NSTEMI) myocardial infarction: Secondary | ICD-10-CM

## 2014-10-09 DIAGNOSIS — E785 Hyperlipidemia, unspecified: Secondary | ICD-10-CM | POA: Diagnosis present

## 2014-10-09 DIAGNOSIS — N400 Enlarged prostate without lower urinary tract symptoms: Secondary | ICD-10-CM | POA: Diagnosis present

## 2014-10-09 DIAGNOSIS — Z9861 Coronary angioplasty status: Secondary | ICD-10-CM | POA: Diagnosis not present

## 2014-10-09 DIAGNOSIS — I5042 Chronic combined systolic (congestive) and diastolic (congestive) heart failure: Secondary | ICD-10-CM | POA: Diagnosis present

## 2014-10-09 DIAGNOSIS — I319 Disease of pericardium, unspecified: Secondary | ICD-10-CM

## 2014-10-09 DIAGNOSIS — Z86718 Personal history of other venous thrombosis and embolism: Secondary | ICD-10-CM | POA: Diagnosis not present

## 2014-10-09 DIAGNOSIS — Z79899 Other long term (current) drug therapy: Secondary | ICD-10-CM | POA: Diagnosis not present

## 2014-10-09 DIAGNOSIS — I739 Peripheral vascular disease, unspecified: Secondary | ICD-10-CM | POA: Diagnosis present

## 2014-10-09 DIAGNOSIS — Z7982 Long term (current) use of aspirin: Secondary | ICD-10-CM | POA: Diagnosis not present

## 2014-10-09 DIAGNOSIS — I129 Hypertensive chronic kidney disease with stage 1 through stage 4 chronic kidney disease, or unspecified chronic kidney disease: Secondary | ICD-10-CM | POA: Diagnosis present

## 2014-10-09 DIAGNOSIS — Z6824 Body mass index (BMI) 24.0-24.9, adult: Secondary | ICD-10-CM | POA: Diagnosis not present

## 2014-10-09 DIAGNOSIS — I252 Old myocardial infarction: Secondary | ICD-10-CM | POA: Diagnosis not present

## 2014-10-09 DIAGNOSIS — K219 Gastro-esophageal reflux disease without esophagitis: Secondary | ICD-10-CM | POA: Diagnosis present

## 2014-10-09 DIAGNOSIS — Z23 Encounter for immunization: Secondary | ICD-10-CM | POA: Diagnosis not present

## 2014-10-09 DIAGNOSIS — M1990 Unspecified osteoarthritis, unspecified site: Secondary | ICD-10-CM | POA: Diagnosis present

## 2014-10-09 DIAGNOSIS — Z8546 Personal history of malignant neoplasm of prostate: Secondary | ICD-10-CM | POA: Diagnosis not present

## 2014-10-09 DIAGNOSIS — H9190 Unspecified hearing loss, unspecified ear: Secondary | ICD-10-CM | POA: Diagnosis present

## 2014-10-09 DIAGNOSIS — I255 Ischemic cardiomyopathy: Secondary | ICD-10-CM | POA: Diagnosis present

## 2014-10-09 DIAGNOSIS — Z96659 Presence of unspecified artificial knee joint: Secondary | ICD-10-CM | POA: Diagnosis present

## 2014-10-09 DIAGNOSIS — N189 Chronic kidney disease, unspecified: Secondary | ICD-10-CM | POA: Diagnosis present

## 2014-10-09 DIAGNOSIS — N289 Disorder of kidney and ureter, unspecified: Secondary | ICD-10-CM

## 2014-10-09 DIAGNOSIS — Z87891 Personal history of nicotine dependence: Secondary | ICD-10-CM | POA: Diagnosis not present

## 2014-10-09 LAB — BASIC METABOLIC PANEL
ANION GAP: 14 (ref 5–15)
BUN: 43 mg/dL — AB (ref 6–23)
CO2: 24 meq/L (ref 19–32)
Calcium: 9.5 mg/dL (ref 8.4–10.5)
Chloride: 100 mEq/L (ref 96–112)
Creatinine, Ser: 1.63 mg/dL — ABNORMAL HIGH (ref 0.50–1.35)
GFR calc Af Amer: 41 mL/min — ABNORMAL LOW (ref >90)
GFR calc non Af Amer: 36 mL/min — ABNORMAL LOW (ref >90)
Glucose, Bld: 139 mg/dL — ABNORMAL HIGH (ref 70–99)
Potassium: 4.2 mEq/L (ref 3.7–5.3)
Sodium: 138 mEq/L (ref 137–147)

## 2014-10-09 LAB — GLUCOSE, CAPILLARY
GLUCOSE-CAPILLARY: 175 mg/dL — AB (ref 70–99)
Glucose-Capillary: 114 mg/dL — ABNORMAL HIGH (ref 70–99)
Glucose-Capillary: 132 mg/dL — ABNORMAL HIGH (ref 70–99)
Glucose-Capillary: 129 mg/dL — ABNORMAL HIGH (ref 70–99)

## 2014-10-09 LAB — TROPONIN I

## 2014-10-09 MED ORDER — GLUCERNA SHAKE PO LIQD
237.0000 mL | Freq: Two times a day (BID) | ORAL | Status: DC
  Administered 2014-10-09 – 2014-10-10 (×3): 237 mL via ORAL

## 2014-10-09 NOTE — Progress Notes (Signed)
Echocardiogram 2D Echocardiogram has been performed.  Zachary Harris 10/09/2014, 3:23 PM

## 2014-10-09 NOTE — Progress Notes (Signed)
Patient is sleeping peacefully. Maintained on telemetry and is in first degree AV block, with a heart rate of 74. Will continue to monitor.  Esperanza Heir, RN

## 2014-10-09 NOTE — Progress Notes (Signed)
INITIAL NUTRITION ASSESSMENT  DOCUMENTATION CODES Per approved criteria  -Non-severe (moderate) malnutrition in the context of chronic illness  Pt meets criteria for moderate MALNUTRITION in the context of chronic illness as evidenced by 7.5% body weight loss in 3 months, PO intake <75% for one month.   INTERVENTION: -Recommend Glucerna Shake po BID, each supplement provides 220 kcal and 10 grams of protein -Recommend 10am snack -Honored food preferences -RD to continue to monitor   NUTRITION DIAGNOSIS: Unintentional wt loss related to inadequate energy intake as evidenced by 15 lb wt loss in 2-3 months.   Goal: Pt to meet >/= 90% of their estimated nutrition needs    Monitor:  Total protein/energy intake, labs, weights, supplement acceptance  Reason for Assessment: MST  80 y.o. male  Admitting Dx: <principal problem not specified>  ASSESSMENT: Zachary Harris is a 80 y.o. male with a history of CAD s/p CABG 1997, PCI of SVG to OM (2007), AV block with syncope s/p St. Jude PPM, HLD, HTN, PVD, DVT historically on coumadin and CKD who presented to [facility] ED with worsening SOB.   -Pt's daughter reported an unintentional wt loss of 15 lbs in past 2-3 months(7.5% body weight loss, significant for time frame) -Diet recall indicates pt consumes two meals daily at home. Breakfast consists of grits and 2 soft boiled eggs, and evening meal is usually salad and catfish soup. Drinks large amount of milk sweetened with SF chocolate syrup. Daughter has tried to encourage pt to consume DM protein drink; however, pt has not been compliant and does not regularly consume supplement -Was willing to trial Glucerna shakes during admit. Current PO intake minimal d/t pt's personal preference of foods and overall disinterest in foods. Consuming 1-2 bites of meals -Will honor food preference. Pt also noted to enjoy peanut butter crackers for snack, RD to also order -Family wearing of liberalizing diet as pt  enjoys putting large amount (tablespoons) of salt to season foods  Height: Ht Readings from Last 1 Encounters:  10/08/14 5' 9.5" (1.765 m)    Weight: Wt Readings from Last 1 Encounters:  10/09/14 171 lb 12.8 oz (77.928 kg)    Ideal Body Weight: 160 lb  % Ideal Body Weight: 107%  Wt Readings from Last 10 Encounters:  10/09/14 171 lb 12.8 oz (77.928 kg)  07/30/14 185 lb (83.915 kg)  07/13/14 188 lb (85.276 kg)  04/16/14 186 lb (84.369 kg)  03/02/14 184 lb (83.462 kg)  12/01/13 189 lb (85.73 kg)  10/20/13 184 lb (83.462 kg)  10/15/13 185 lb 6.4 oz (84.097 kg)  10/14/13 185 lb 6.4 oz (84.097 kg)  09/18/13 186 lb 3.2 oz (84.46 kg)    Usual Body Weight: 185 lb  % Usual Body Weight: 92%  BMI:  Body mass index is 25.02 kg/(m^2).  Estimated Nutritional Needs: Kcal: 1900-2100 Protein: 80-90 gram Fluid: >/= 1900 ml daily  Skin: WDL  Diet Order: Sodium Restricted  EDUCATION NEEDS: -No education needs identified at this time   Intake/Output Summary (Last 24 hours) at 10/09/14 1615 Last data filed at 10/09/14 1400  Gross per 24 hour  Intake    706 ml  Output    700 ml  Net      6 ml    Last BM: 10/16   Labs:   Recent Labs Lab 10/08/14 1021 10/08/14 1603 10/09/14 0400  NA 139  --  138  K 4.1  --  4.2  CL 102  --  100  CO2  21  --  24  BUN 39*  --  43*  CREATININE 1.48* 1.55* 1.63*  CALCIUM 9.5  --  9.5  MG  --  2.0  --   GLUCOSE 157*  --  139*    CBG (last 3)   Recent Labs  10/08/14 2121 10/09/14 0804 10/09/14 1109  GLUCAP 155* 114* 175*    Scheduled Meds: . aspirin EC  81 mg Oral QHS  . atorvastatin  40 mg Oral Daily  . darifenacin  15 mg Oral Daily  . feeding supplement (GLUCERNA SHAKE)  237 mL Oral BID BM  . fenofibrate  160 mg Oral Daily  . furosemide  40 mg Intravenous BID  . heparin  5,000 Units Subcutaneous 3 times per day  . insulin aspart  0-15 Units Subcutaneous TID WC  . Memantine HCl ER  28 mg Oral QHS  . sodium chloride  3  mL Intravenous Q12H  . tamsulosin  0.4 mg Oral QPC breakfast    Continuous Infusions:   Past Medical History  Diagnosis Date  . ANEMIA-IRON DEFICIENCY 05/27/2007  . ANXIETY 11/14/2007  . Intermittent high-grade heart block 09/05/2009  . BENIGN PROSTATIC HYPERTROPHY 11/14/2007  . CAROTID ARTERY DISEASE 10/02/2010  . Pacemaker-St. Jude 02/02/2008    St. Jude  . COLONIC POLYPS, HX OF 05/27/2007  . CORONARY ARTERY DISEASE 11/14/2007    prior bypass surgery; ejection fraction 40-45% 2012  . DEPRESSION 11/14/2007  . DIABETES MELLITUS, TYPE II 11/14/2007  . DIVERTICULITIS, HX OF 05/27/2007  . DVT (deep venous thrombosis) 03/21/2011  . Edema 01/15/2011  . FOOT PAIN, BILATERAL 12/08/2010  . GERD 11/14/2007  . GROIN PAIN 09/24/2008  . HYPERLIPIDEMIA 11/14/2007  . HYPERTENSION 11/14/2007  . HYPOTENSION, ORTHOSTATIC 03/06/2011  . Long term current use of anticoagulant 03/21/2011  . Memory loss 06/01/2008  . OSTEOPOROSIS 06/02/2010  . PERIPHERAL VASCULAR DISEASE 11/14/2007  . RENAL INSUFFICIENCY 11/14/2007  . SECONDARY DM W/PERIPHERAL CIRC D/O UNCONTROLLED 11/07/2010  . SYNCOPE 09/08/2008  . Unspecified hearing loss 05/13/2009  . Prostate cancer   . Seborrheic keratosis   . Myalgia and myositis, unspecified 04/24/2011  . Other malaise and fatigue 04/24/2011  . Peripheral neuropathy 03/27/2011  . Seborrheic keratosis 06/05/2011  . Phlebitis and thrombophlebitis of other deep vessels of lower extremities   . Osteoarthrosis, unspecified whether generalized or localized, unspecified site   . Syncope and collapse   . Shortness of breath     Past Surgical History  Procedure Laterality Date  . S/p coronary stent x 1    . Cholecystectomy    . S/p bilat knee replacement  Cleon Gustin, MD  . Pacemaker insertion  2012    Caryl Comes, MD  . Prostate needle biopsy    . Cystoscopy    . Appendectomy    . Coronary artery bypass graft  2007    Atlee Abide [place] LDN Clinical Dietitian [phone]

## 2014-10-09 NOTE — Progress Notes (Signed)
Patient Name: Zachary Harris Date of Encounter: 10/09/2014  Active Problems:   SOB (shortness of breath)   Length of Stay: 1  SUBJECTIVE  The patient still feels weak and mildly SOB when walking to the bathroom. He looks comfortable in bed.  CURRENT MEDS . aspirin EC  81 mg Oral QHS  . atorvastatin  40 mg Oral Daily  . darifenacin  15 mg Oral Daily  . fenofibrate  160 mg Oral Daily  . furosemide  40 mg Intravenous BID  . heparin  5,000 Units Subcutaneous 3 times per day  . insulin aspart  0-15 Units Subcutaneous TID WC  . Memantine HCl ER  28 mg Oral QHS  . sodium chloride  3 mL Intravenous Q12H  . tamsulosin  0.4 mg Oral QPC breakfast    OBJECTIVE  Filed Vitals:   10/09/14 0125 10/09/14 0448 10/09/14 0938 10/09/14 0945  BP: 109/65 130/64 122/65   Pulse: 78 80 87   Temp: 97.8 F (36.6 C) 98.1 F (36.7 C)    TempSrc: Oral Oral    Resp: 17 17 18    Height:      Weight:  171 lb 12.8 oz (77.928 kg)    SpO2: 91% 91% 86% 93%    Intake/Output Summary (Last 24 hours) at 10/09/14 1115 Last data filed at 10/09/14 0942  Gross per 24 hour  Intake    476 ml  Output   1225 ml  Net   -749 ml   Filed Weights   10/08/14 1438 10/09/14 0448  Weight: 173 lb 15.1 oz (78.9 kg) 171 lb 12.8 oz (77.928 kg)    PHYSICAL EXAM  General: Pleasant, NAD. Neuro: Alert and oriented X 3. Moves all extremities spontaneously. Psych: Normal affect. HEENT:  Normal  Neck: Supple without bruits or JVD. Lungs:  Resp regular and unlabored, CTA. Heart: RRR no s3, s4, or murmurs. Abdomen: Soft, non-tender, non-distended, BS + x 4.  Extremities: No clubbing, cyanosis or edema. DP/PT/Radials 2+ and equal bilaterally.  Accessory Clinical Findings  CBC  Recent Labs  10/08/14 1021 10/08/14 1603  WBC 5.2 5.4  HGB 14.7 14.9  HCT 44.1 43.6  MCV 89.6 89.3  PLT 172 250   Basic Metabolic Panel  Recent Labs  10/08/14 1021 10/08/14 1603 10/09/14 0400  NA 139  --  138  K 4.1  --   4.2  CL 102  --  100  CO2 21  --  24  GLUCOSE 157*  --  139*  BUN 39*  --  43*  CREATININE 1.48* 1.55* 1.63*  CALCIUM 9.5  --  9.5  MG  --  2.0  --    Liver Function Tests  Recent Labs  10/08/14 1021  AST 25  ALT 11  ALKPHOS 44  BILITOT 0.9  PROT 7.3  ALBUMIN 3.6   Cardiac Enzymes  Recent Labs  10/08/14 1603 10/08/14 2150 10/09/14 0400  TROPONINI <0.30 <0.30 <0.30    Recent Labs  10/08/14 1603  TSH 3.830    Radiology/Studies  Dg Chest 2 View  10/08/2014   CLINICAL DATA:  Shortness of breath  EXAM: CHEST  2 VIEW  COMPARISON:  05/01/2013  FINDINGS: Post CABG. Left pacer remains in place, unchanged. Heart is normal size. No confluent airspace opacities, effusions or edema. No acute bony abnormality. Degenerative changes in the thoracic spine and shoulders.  IMPRESSION: No active cardiopulmonary disease.   Electronically Signed   By: Rolm Baptise M.D.   On:  10/08/2014 12:00    TELE: 1.AVB, intermittent V pacing  ECG: SR, 1.AVB, LVH with repolarization abnormalities, negative - non-specific changes in anterior leads when compared to ECG from 03/2014    ASSESSMENT AND PLAN  JAICE DIGIOIA is a 80 y.o. male with a history of CAD s/p CABG 1997, PCI of SVG to OM (2007), AV block with syncope s/p St. Jude PPM, HLD, HTN, DM, PVD, DVT historically on coumadin and CKD who presented to [facility] ED with worsening SOB.   Increased DOE-  -- POC troponin mildly elevated at 0.10. Likely demand ischemic in the setting of mild, acute diastolic CHF. Three follow up troponins negative.  -- BNP mildly elevated at 678. CXR with no CHF  -- ECG with some non-specific ST and T wave abnormality anterior leads  -- Continue ASA, statin  -- negative fluid balance -0.8 L, with worsening Crea 1.48-->1.63, negative 2 lbs, now 171 lbs, on the last outpatient visit 185 lbs, we will hold lasix tonight and reevaluate Crea and clinical status in the am -- Will repeat ECHO - pending  - I don't think  his SOB is cardiac in origin, he has lost 14 lbs in the last 2 months, he has no appetite. This might be overall slowing down due to age, possibly other underlying disease?  CKD- creat 1.48 ( baseline 1.2-1.4), worsening,   HTN- blood pressure well controlled on no medications   DM- hold metformin in the case he needs a cardiac cath  -- SSI   Signed, [name] MD, [facility] 10/09/2014

## 2014-10-09 NOTE — Progress Notes (Signed)
UR completed 

## 2014-10-10 ENCOUNTER — Encounter: Payer: Self-pay | Admitting: Physician Assistant

## 2014-10-10 ENCOUNTER — Encounter (HOSPITAL_COMMUNITY): Payer: Self-pay | Admitting: Physician Assistant

## 2014-10-10 DIAGNOSIS — I255 Ischemic cardiomyopathy: Secondary | ICD-10-CM

## 2014-10-10 DIAGNOSIS — R635 Abnormal weight gain: Secondary | ICD-10-CM

## 2014-10-10 DIAGNOSIS — E440 Moderate protein-calorie malnutrition: Secondary | ICD-10-CM

## 2014-10-10 DIAGNOSIS — I5042 Chronic combined systolic (congestive) and diastolic (congestive) heart failure: Secondary | ICD-10-CM

## 2014-10-10 DIAGNOSIS — R634 Abnormal weight loss: Secondary | ICD-10-CM

## 2014-10-10 HISTORY — DX: Chronic combined systolic (congestive) and diastolic (congestive) heart failure: I50.42

## 2014-10-10 HISTORY — DX: Abnormal weight loss: R63.4

## 2014-10-10 LAB — BASIC METABOLIC PANEL
Anion gap: 15 (ref 5–15)
BUN: 42 mg/dL — ABNORMAL HIGH (ref 6–23)
CALCIUM: 9.4 mg/dL (ref 8.4–10.5)
CO2: 24 mEq/L (ref 19–32)
Chloride: 96 mEq/L (ref 96–112)
Creatinine, Ser: 1.61 mg/dL — ABNORMAL HIGH (ref 0.50–1.35)
GFR, EST AFRICAN AMERICAN: 42 mL/min — AB (ref >90)
GFR, EST NON AFRICAN AMERICAN: 36 mL/min — AB (ref >90)
GLUCOSE: 139 mg/dL — AB (ref 70–99)
Potassium: 3.9 mEq/L (ref 3.7–5.3)
Sodium: 135 mEq/L — ABNORMAL LOW (ref 137–147)

## 2014-10-10 LAB — GLUCOSE, CAPILLARY
GLUCOSE-CAPILLARY: 134 mg/dL — AB (ref 70–99)
Glucose-Capillary: 133 mg/dL — ABNORMAL HIGH (ref 70–99)

## 2014-10-10 MED ORDER — FUROSEMIDE 20 MG PO TABS: 20.0000 mg | ORAL_TABLET | ORAL | Status: DC

## 2014-10-10 MED ORDER — GLUCERNA SHAKE PO LIQD: 237.0000 mL | Freq: Two times a day (BID) | ORAL | Status: DC

## 2014-10-10 NOTE — Progress Notes (Signed)
Patient discharge information given and questions answered with family members present.  Patient discharged to home via wheelchair by staff member. Patient discharged on 2L of oxygen brought in by [place].

## 2014-10-10 NOTE — Progress Notes (Signed)
Patient is sleeping peacefully. Maintained on telemetry and is in first degree heart-block on the monitor. Will continue to monitor.  Esperanza Heir, RN

## 2014-10-10 NOTE — Progress Notes (Signed)
SATURATION QUALIFICATIONS: (This note is used to comply with regulatory documentation for home oxygen)  Patient Saturations on Room Air at Rest = 88%  Patient Saturations on Room Air while Ambulating = 78%  Patient Saturations on 2 Liters of oxygen while Ambulating = 93%  Please briefly explain why patient needs home oxygen:  Patient's oxygen saturation dropped to 78% on room air when ambulating ~57ft.  Patient placed on 2L via nasal cannula and oxygen saturation rose to 93%.

## 2014-10-10 NOTE — Discharge Summary (Signed)
Discharge Summary   Patient ID: Zachary Harris, MRN: [id number], DOB/AGE: September 04, 1923 80 y.o.  Admit date: 10/08/2014 Discharge date: 10/10/2014   Primary Care Physician:  Estill Dooms   Primary Cardiologist:  Dr. Kirk Ruths  Primary Electrophysiologist:  Dr. Virl Axe   Reason for Admission:  Dyspnea    Primary Discharge Diagnoses:  Principal Problem:   SOB (shortness of breath) Active Problems:   Chronic combined systolic and diastolic CHF (congestive heart failure)   Weight loss   CAD (coronary artery disease)   CKD (chronic kidney disease)   Ischemic cardiomyopathy   DM2 (diabetes mellitus, type 2)   Essential hypertension   Malnutrition of moderate degree     Wt Readings from Last 3 Encounters:  10/10/14 170 lb 9.6 oz (77.384 kg)  07/30/14 185 lb (83.915 kg)  07/13/14 188 lb (85.276 kg)    Secondary Discharge Diagnoses:   Past Medical History  Diagnosis Date  . ANEMIA-IRON DEFICIENCY 05/27/2007  . ANXIETY 11/14/2007  . Intermittent high-grade heart block 09/05/2009    s/p pacemaker  . BENIGN PROSTATIC HYPERTROPHY 11/14/2007  . CAROTID ARTERY DISEASE 10/02/2010  . Pacemaker-St. Jude 02/02/2008    St. Jude  . COLONIC POLYPS, HX OF 05/27/2007  . CORONARY ARTERY DISEASE 11/14/2007    prior bypass [phone]; Inferior MI 11/07; Severe 3 vessel disease; occluded SVG to RCA; SVG to OM subtotalled, SVG to diagonal with ostial 70 and 80 mid; EF 45. PCI of SVG to OM. FU myovue showed EF 56 with inferior infarct and very mild peri-infarct ischemia  . DEPRESSION 11/14/2007  . DIABETES MELLITUS, TYPE II 11/14/2007  . DIVERTICULITIS, HX OF 05/27/2007  . DVT (deep venous thrombosis) 03/21/2011  . FOOT PAIN, BILATERAL 12/08/2010  . GERD 11/14/2007  . HYPERLIPIDEMIA 11/14/2007  . HYPERTENSION 11/14/2007  . HYPOTENSION, ORTHOSTATIC 03/06/2011  . Memory loss 06/01/2008  . OSTEOPOROSIS 06/02/2010  . PERIPHERAL VASCULAR DISEASE 11/14/2007  . RENAL INSUFFICIENCY  11/14/2007  . SECONDARY DM W/PERIPHERAL CIRC D/O UNCONTROLLED 11/07/2010  . SYNCOPE 09/08/2008  . Unspecified hearing loss 05/13/2009  . Prostate cancer   . Myalgia and myositis, unspecified 04/24/2011  . Other malaise and fatigue 04/24/2011  . Peripheral neuropathy 03/27/2011  . Seborrheic keratosis 06/05/2011  . Osteoarthrosis, unspecified whether generalized or localized, unspecified site   . Ischemic cardiomyopathy     a. echo (10/15):  mod focal basal septal hypertrophy, EF 40-45%, inf AK, Gr 1 DD, Al sclerosis, mild AI, MAC, mild MR, mod LAE, lipomatous hypertrophy, small effusion (no hemodynamic compromise)       Allergies:   No Known Allergies    Procedures Performed This Admission:    None    Hospital Course:  Zachary Harris is a 80 y.o. male with a hx of CAD s/p CABG 1997, PCI of SVG to OM (2007), AV block with syncope s/p St. Jude PPM, ICM with EF ranging from 40-55% over the years, HLD, HTN, PVD, DVT previously on coumadin (DC'd 03/2014) and CKD who presented to [facility] ED with worsening SOB x 2 weeks.  His family noticed increased work of breathing with walking to the car from a restaurant stopping several times to catch his breath.  He denied chest pain.    In the ED, his BNP was mildly elevated at 678 and POC troponin was slightly elevated at 0.10.  He was admitted for diuresis from volume overload.    1.  Elevated Troponin - Subsequent troponins remained normal.  He ruled out  for ACS.  2.  Dyspnea  -  He was started on IV Lasix for suspected acute on chronic combined systolic and diastolic CHF.  He lost 2 lbs and and his creatinine increased with one dose (1.48>>1.62) and his Lasix was held.  His shortness of breath was overall felt to not likely be from a cardiac origin.  Patient was noted to lose 14 lbs in the last 2 mos and he complained of poor appetite.   This might be overall slowing down due to age, possibly other underlying disease.  Patient was advised to FU with his PCP for  further evaluation.  3.  Ischemic Cardiomyopathy  -  Echocardiogram this admission demonstrated EF 40-45%.  This was fairly consistent with prior measurements, although his last EF was 55% in 2009.  The patient was not felt to require further inpatient workup.     4.  Chronic Kidney Disease  -  Creatinine increased with diuresis, but improved prior to DC.    5.  Chronic Combined Systolic and Diastolic CHF  -  Lasix was resumed at 20 mg every other day at DC.  6.  Moderate Protein Calorie Malnutrition  -  Patient was seen by the dietician and Glucerna supplementation was discussed.  Patient will need to FU with his primary care physician for evaluation of weight loss.    Patient was seen by Dr. Ena Dawley on the AM of 10/10/2014 and felt to be stable for DC to home.    Discharge Vitals:   Blood pressure 114/68, pulse 72, temperature 97.4 F (36.3 C), temperature source Oral, resp. rate 16, height 5' 9.5" (1.765 m), weight 170 lb 9.6 oz (77.384 kg), SpO2 94.00%.   Labs:   Recent Labs  10/08/14 1021 10/08/14 1603  WBC 5.2 5.4  HGB 14.7 14.9  HCT 44.1 43.6  MCV 89.6 89.3  PLT 172 163     Recent Labs  10/08/14 1021 10/08/14 1603 10/09/14 0400 10/10/14 0355  NA 139  --  138 135*  K 4.1  --  4.2 3.9  CL 102  --  100 96  CO2 21  --  24 24  BUN 39*  --  43* 42*  CREATININE 1.48* 1.55* 1.63* 1.61*  CALCIUM 9.5  --  9.5 9.4  PROT 7.3  --   --   --   BILITOT 0.9  --   --   --   ALKPHOS 44  --   --   --   ALT 11  --   --   --   AST 25  --   --   --      Recent Labs  10/08/14 1603 10/08/14 2150 10/09/14 0400  TROPONINI <0.30 <0.30 <0.30     Lab Results  Component Value Date   TSH 3.830 10/08/2014    Diagnostic Procedures and Studies:  Dg Chest 2 View   10/08/2014    IMPRESSION: No active cardiopulmonary disease.   Electronically Signed   By: Rolm Baptise M.D.   On: 10/08/2014 12:00     2D Echocardiogram 10/09/14:  Study Conclusions  - Left  ventricle: The cavity size was normal. There was moderate focal basal hypertrophy. Systolic function was mildly to moderately reduced. The estimated ejection fraction was in the range of 40% to 45%. Poor acoustical windows make assesment of regional wall motion difficult but here is akinesis of the basal-mid inferior myocardium. Other walls are not well visualized. There was an increased relative  contribution of atrial contraction to ventricular filling. Doppler parameters are consistent with abnormal left ventricular relaxation (grade 1 diastolic dysfunction). - Aortic valve: Moderate diffuse thickening and calcification, consistent with sclerosis. There was mild regurgitation. - Mitral valve: Moderately calcified annulus. Mild calcification. There was mild regurgitation. - Left atrium: The atrium was moderately dilated. - Atrial septum: There was increased thickness of the septum, consistent with lipomatous hypertrophy. - Pericardium, extracardiac: A small, free-flowing pericardial effusion was identified along the left ventricular free wall. There was no evidence of hemodynamic compromise.     Disposition:   Pt is being discharged home today in good condition.  Follow-up Plans & Appointments      Follow-up Information   Schedule an appointment as soon as possible for a visit with GREEN, Viviann Spare, MD. (for follow up on weight loss)    Specialty:  Internal Medicine   Contact information:   [place] [address] [phone]       Follow up with Kirk Ruths, MD In 2 weeks. (the office will call to schedule an appointment)    Specialty:  Cardiology   Contact information:   [address] [place] [address] [phone]       Follow up with Virl Axe, MD On 10/15/2014. (as scheduled already)    Specialty:  Cardiology   Contact information:   [address] [address] [phone]       Discharge  Medications    Medication List         alendronate 70 MG tablet  Commonly known as:  FOSAMAX  Take 70 mg by mouth every Saturday. Take with a full glass of water on an empty stomach.     aspirin EC 81 MG tablet  Take 81 mg by mouth at bedtime.     atorvastatin 40 MG tablet  Commonly known as:  LIPITOR  Take 40 mg by mouth daily. To control cholesterol     feeding supplement (GLUCERNA SHAKE) Liqd  Take 237 mLs by mouth 2 (two) times daily between meals.     fenofibrate 160 MG tablet  Take 160 mg by mouth daily. To control cholesterol     furosemide 20 MG tablet  Commonly known as:  LASIX  Take 1 tablet (20 mg total) by mouth every other day.     Memantine HCl ER 14 MG Cp24  Take 28 mg by mouth at bedtime.     metFORMIN 500 MG tablet  Commonly known as:  GLUCOPHAGE  One each morning to control glucose     nitroGLYCERIN 0.4 MG SL tablet  Commonly known as:  NITROSTAT  Place 0.4 mg under the tongue every 5 (five) minutes as needed for chest pain.     solifenacin 10 MG tablet  Commonly known as:  VESICARE  Take 10 mg by mouth daily. To decrease urinary urgency     tamsulosin 0.4 MG Caps capsule  Commonly known as:  FLOMAX  Take 0.4 mg by mouth daily after breakfast. For bladder         Outstanding Labs/Studies  1. None   Duration of Discharge Encounter: Greater than 30 minutes including physician and PA time.  Signed, Richardson Dopp, PA-C   10/10/2014 11:40 AM

## 2014-10-10 NOTE — Discharge Summary (Signed)
The patient was seen, examined and discussed with Brittainy M. Rosita Fire, PA-C and I agree with the above.   [name] 10/10/2014

## 2014-10-10 NOTE — Care Management Note (Signed)
    Page 1 of 1   10/10/2014     2:04:50 PM CARE MANAGEMENT NOTE 10/10/2014  Patient:  CRAIGE, PATEL   Account Number:  [id number]  Date Initiated:  10/10/2014  Documentation initiated by:  [facility]  Subjective/Objective Assessment:   adm: Dyspnea     Action/Plan:   discharge planning   Anticipated DC Date:  10/10/2014   Anticipated DC Plan:  [facility]  CM consult      Choice offered to / List presented to:     DME arranged  [facility]      DME agency  [place] [facility].        Status of service:  Completed, signed off Medicare Important Message given?   (If response is "NO", the following Medicare IM given date fields will be blank) Date Medicare IM given:   Medicare IM given by:   Date Additional Medicare IM given:   Additional Medicare IM given by:    Discharge Disposition:  HOME/SELF CARE  Per UR Regulation:    If discussed at Long Length of Stay Meetings, dates discussed:    Comments:  10/10/14 14:00 Cm received call from Rn requesting home O2. CM called [facility] DME rep, Jeneen Rinks to deliver tanks to room.  Pt lives in the independent of [facility].  no other CM needs were communicated.  Mariane Masters, BSN, CM [phone].

## 2014-10-10 NOTE — Progress Notes (Signed)
Patient Profile: Zachary Harris is a 80 y.o. male with a history of CAD s/p CABG 1997, PCI of SVG to OM (2007), AV block with syncope s/p St. Jude PPM, HLD, HTN, DM, PVD, DVT historically on coumadin and CKD who presented to [facility] ED with worsening SOB.   Subjective: No complaints. Denies significant SOB. Objective: Vital signs in last 24 hours: Temp:  [97.4 F (36.3 C)-97.6 F (36.4 C)] 97.4 F (36.3 C) (10/18 0514) Pulse Rate:  [69-87] 69 (10/18 0514) Resp:  [18] 18 (10/18 0514) BP: (115-131)/(52-65) 131/65 mmHg (10/18 0514) SpO2:  [86 %-96 %] 95 % (10/18 0514) Weight:  [170 lb 9.6 oz (77.384 kg)] 170 lb 9.6 oz (77.384 kg) (10/18 0514) Last BM Date: 10/08/14  Intake/Output from previous day: 10/17 0701 - 10/18 0700 In: 82 [P.O.:960; I.V.:9] Out: 975 [Urine:975] Intake/Output this shift:   Medications Current Facility-Administered Medications  Medication Dose Route Frequency Provider Last Rate Last Dose  . 0.9 %  sodium chloride infusion  250 mL Intravenous PRN Eileen Stanford, PA-C      . acetaminophen (TYLENOL) tablet 650 mg  650 mg Oral Q4H PRN Eileen Stanford, PA-C      . aspirin EC tablet 81 mg  81 mg Oral QHS Eileen Stanford, PA-C   81 mg at 10/09/14 2208  . atorvastatin (LIPITOR) tablet 40 mg  40 mg Oral Daily Eileen Stanford, PA-C   40 mg at 10/09/14 3846  . darifenacin (ENABLEX) 24 hr tablet 15 mg  15 mg Oral Daily Eileen Stanford, PA-C   15 mg at 10/09/14 6599  . feeding supplement (GLUCERNA SHAKE) (GLUCERNA SHAKE) liquid 237 mL  237 mL Oral BID BM Hazle Coca, RD   237 mL at 10/09/14 2032  . fenofibrate tablet 160 mg  160 mg Oral Daily Eileen Stanford, PA-C   160 mg at 10/09/14 3570  . furosemide (LASIX) injection 40 mg  40 mg Intravenous BID Eileen Stanford, PA-C   40 mg at 10/10/14 0805  . heparin injection 5,000 Units  5,000 Units Subcutaneous 3 times per day Eileen Stanford, PA-C   5,000 Units at 10/10/14 1779  . insulin aspart  (novoLOG) injection 0-15 Units  0-15 Units Subcutaneous TID WC Eileen Stanford, PA-C   2 Units at 10/10/14 3903  . Memantine HCl ER CP24 28 mg  28 mg Oral QHS Candee Furbish, MD   28 mg at 10/09/14 2208  . nitroGLYCERIN (NITROSTAT) SL tablet 0.4 mg  0.4 mg Sublingual Q5 min PRN Eileen Stanford, PA-C      . ondansetron [facility]) injection 4 mg  4 mg Intravenous Q6H PRN Eileen Stanford, PA-C      . sodium chloride 0.9 % injection 3 mL  3 mL Intravenous Q12H Eileen Stanford, PA-C   3 mL at 10/09/14 2209  . sodium chloride 0.9 % injection 3 mL  3 mL Intravenous PRN Eileen Stanford, PA-C   3 mL at 10/09/14 0092  . tamsulosin (FLOMAX) capsule 0.4 mg  0.4 mg Oral QPC breakfast Eileen Stanford, PA-C   0.4 mg at 10/09/14 3300    PE: General appearance: alert, cooperative and no distress Neck: no carotid bruit and no JVD Lungs: expiratory rhonchi bilaterally Heart: regular rate and rhythm Extremities: no LEE Pulses: 2+ and symmetric Skin: warm and dry Neurologic: Grossly normal  Lab Results:   Recent Labs  10/08/14 1021 10/08/14 1603  WBC 5.2 5.4  HGB 14.7 14.9  HCT 44.1 43.6  PLT 172 163   BMET  Recent Labs  10/08/14 1021 10/08/14 1603 10/09/14 0400 10/10/14 0355  NA 139  --  138 135*  K 4.1  --  4.2 3.9  CL 102  --  100 96  CO2 21  --  24 24  GLUCOSE 157*  --  139* 139*  BUN 39*  --  43* 42*  CREATININE 1.48* 1.55* 1.63* 1.61*  CALCIUM 9.5  --  9.5 9.4   Assessment/Plan    Active Problems:   SOB (shortness of breath)  Increased DOE-  -- POC troponin mildly elevated at 0.10. Likely demand ischemic in the setting of mild, acute diastolic CHF. Three follow up troponins negative.  -- BNP mildly elevated at 678. CXR with no CHF  -- ECG with some non-specific ST and T wave abnormality anterior leads  -- 2D echo 10/09/14 with EF at 40-45% (55% in 2009) -- Continue ASA, statin  -- even fluid balance, slightly worsened Crea from his baseline. 1.48-->1.61,  negative 3 lbs since the admission, now 170 lbs, on the last outpatient visit 185 lbs --Lasix was held yesterday due to renal function. Only inimal improvement from 1.63 to 1.61. Not volume overloaded on exam. Would recommending holding AM dose until seen by MD.   CKD- creat 1.61 ( baseline 1.2-1.4), worsening. Keep diuretic on hold for now.    HTN- blood pressure well controlled on no medications   DM- restart metformin, no cath  Systolic dysfunction - Recent Echo 10/17 demonstrated drop in EF from 55% in 2009 down to 40-45%. Patient has h/o CAD with prior CABG in 1997 and PCI to SVG-OM in 2007. POC troponin was minimally elevated at 0.10 on admit. ? If his dyspnea is his anginal equivalent. ? Diagnostic LHC. Will defer to MD.     LOS: 2 days    Brittainy M. Ladoris Gene 10/10/2014 8:13 AM   The patient was seen, examined and discussed with Brittainy M. Rosita Fire, PA-C and I agree with the above.    I don't think his SOB is cardiac in origin, he has lost 14 lbs in the last 2 months, he has no appetite. This might be overall slowing down due to age, possibly other underlying disease, he is advise to follow with his PCP. He will follow with Dr Stanford Breed. I would resume Lasix 20 mg PO every other day.  [name] 10/10/2014

## 2014-10-12 ENCOUNTER — Telehealth: Payer: Self-pay | Admitting: Cardiology

## 2014-10-12 NOTE — H&P (Signed)
Personally seen and examined, agree with above. Changes to note made as necessary. Candee Furbish, MD

## 2014-10-12 NOTE — Telephone Encounter (Signed)
Spoke with pt dtr, the patient was discharged from the hosp with oxygen. The dtr understands the reasons for the o2 but the patient is complaining about having to use it and she can not tell it is helping. She would like him to be re-evaluated to make sure it is needed.  They have a follow up appt the first of November. Will discuss with dr Stanford Breed

## 2014-10-12 NOTE — Telephone Encounter (Signed)
New problem    Pt's daughter need to know if her father need to continue to be on oxygen. Please call pt's daughter concerning this matter.

## 2014-10-12 NOTE — Telephone Encounter (Addendum)
Spoke with [facility], they do not come out periodically to check if the need for oxygen is still there. Spoke with pt dtr, she wants to keep everything the same until his follow up appt.

## 2014-10-15 ENCOUNTER — Telehealth: Payer: Self-pay | Admitting: Internal Medicine

## 2014-10-15 ENCOUNTER — Encounter: Payer: Self-pay | Admitting: Internal Medicine

## 2014-10-15 ENCOUNTER — Ambulatory Visit (INDEPENDENT_AMBULATORY_CARE_PROVIDER_SITE_OTHER): Payer: Medicare Other | Admitting: Internal Medicine

## 2014-10-15 VITALS — BP 146/74 | HR 67 | Ht 69.0 in | Wt 175.8 lb

## 2014-10-15 DIAGNOSIS — R5381 Other malaise: Secondary | ICD-10-CM

## 2014-10-15 DIAGNOSIS — N183 Chronic kidney disease, stage 3 unspecified: Secondary | ICD-10-CM

## 2014-10-15 DIAGNOSIS — I480 Paroxysmal atrial fibrillation: Secondary | ICD-10-CM

## 2014-10-15 DIAGNOSIS — Z45018 Encounter for adjustment and management of other part of cardiac pacemaker: Secondary | ICD-10-CM

## 2014-10-15 DIAGNOSIS — I441 Atrioventricular block, second degree: Secondary | ICD-10-CM

## 2014-10-15 LAB — MDC_IDC_ENUM_SESS_TYPE_INCLINIC
Battery Remaining Longevity: 134.4 mo
Brady Statistic RV Percent Paced: 22 %
Implantable Pulse Generator Model: 2210
Implantable Pulse Generator Serial Number: 7228315
Lead Channel Impedance Value: 512.5 Ohm
Lead Channel Impedance Value: 612.5 Ohm
Lead Channel Pacing Threshold Amplitude: 0.75 V
Lead Channel Pacing Threshold Amplitude: 0.75 V
Lead Channel Pacing Threshold Pulse Width: 0.4 ms
Lead Channel Pacing Threshold Pulse Width: 0.4 ms
Lead Channel Pacing Threshold Pulse Width: 0.4 ms
Lead Channel Setting Pacing Amplitude: 2 V
Lead Channel Setting Pacing Pulse Width: 0.4 ms
Lead Channel Setting Sensing Sensitivity: 2 mV
MDC IDC MSMT BATTERY VOLTAGE: 2.96 V
MDC IDC MSMT LEADCHNL RA PACING THRESHOLD AMPLITUDE: 0.75 V
MDC IDC MSMT LEADCHNL RA PACING THRESHOLD PULSEWIDTH: 0.4 ms
MDC IDC MSMT LEADCHNL RA SENSING INTR AMPL: 4.4 mV
MDC IDC MSMT LEADCHNL RV PACING THRESHOLD AMPLITUDE: 0.75 V
MDC IDC MSMT LEADCHNL RV SENSING INTR AMPL: 11.3 mV
MDC IDC SESS DTM: 20151023092933
MDC IDC SET LEADCHNL RV PACING AMPLITUDE: 2.5 V
MDC IDC STAT BRADY RA PERCENT PACED: 2.9 %

## 2014-10-15 NOTE — Telephone Encounter (Signed)
Spoke with Milus Banister and told her pt needs to have basic metabolic profile drawn on 10/26

## 2014-10-15 NOTE — Telephone Encounter (Signed)
New message    Lab order saying BMB. Please clarify for 10/26 lab drawn.

## 2014-10-15 NOTE — Patient Instructions (Addendum)
Your physician recommends that you continue on your current medications as directed. Please refer to the Current Medication list given to you today.  You will need to have lab work drawn on 10/18/14  BMET  Merlin device check 01/18/15  Your physician wants you to follow-up in: 1 year with Dr. Caryl Comes.  You will receive a reminder letter in the mail two months in advance. If you don't receive a letter, please call our office to schedule the follow-up appointment.

## 2014-10-15 NOTE — Progress Notes (Signed)
Patient Care Team: Estill Dooms, MD as PCP - General (Internal Medicine) Malka So, MD as Consulting Physician (Urology) Renella Cunas, MD as Consulting Physician (Cardiology) [facility] Lelon Perla, MD as Attending Physician (Cardiology) Man Mast X, NP as Nurse Practitioner (Nurse Practitioner)   HPI  Zachary Harris is a 80 y.o. male Seen in followup for pacemaker implanted for high-grade heart block and recurrent syncope. He also had orthostatic hypotension potentially contributing to his syncope, this potentially associated with diabetic autonomic neuropathy    His history of ischemic heart disease with prior bypass grafting and depressed left ventricular function with an EF of 40-45%. He was hospitalized earlier this month for dyspnea that had been progressive. He underwent IV diuresis that was complicated by worsening of his renal function. Echocardiogram at that time had demonstrated no change in ejection fraction  Electrogram evaluation has demonstrated some atrial fibrillation. He is not on anticoagulation.  He was noted to have significant intercurrent weight loss He is not nearly as independent as he was a couple of weeks ago and there has been a gradual but progressive deterioration in function over the last 3 months.  He is  To  start physical therapy next week. He is not inclined to continue wearing his oxygen so this will need to be reassessed.      Past Medical History  Diagnosis Date  . ANEMIA-IRON DEFICIENCY 05/27/2007  . ANXIETY 11/14/2007  . Intermittent high-grade heart block 09/05/2009    s/p pacemaker  . BENIGN PROSTATIC HYPERTROPHY 11/14/2007  . CAROTID ARTERY DISEASE 10/02/2010  . Pacemaker-St. Jude 02/02/2008    St. Jude  . COLONIC POLYPS, HX OF 05/27/2007  . CORONARY ARTERY DISEASE 11/14/2007    prior bypass [phone]; Inferior MI 11/07; Severe 3 vessel disease; occluded SVG to RCA; SVG to OM subtotalled, SVG to diagonal with  ostial 70 and 80 mid; EF 45. PCI of SVG to OM. FU myovue showed EF 56 with inferior infarct and very mild peri-infarct ischemia  . DEPRESSION 11/14/2007  . DIABETES MELLITUS, TYPE II 11/14/2007  . DIVERTICULITIS, HX OF 05/27/2007  . DVT (deep venous thrombosis) 03/21/2011  . FOOT PAIN, BILATERAL 12/08/2010  . GERD 11/14/2007  . HYPERLIPIDEMIA 11/14/2007  . HYPERTENSION 11/14/2007  . HYPOTENSION, ORTHOSTATIC 03/06/2011  . Memory loss 06/01/2008  . OSTEOPOROSIS 06/02/2010  . PERIPHERAL VASCULAR DISEASE 11/14/2007  . RENAL INSUFFICIENCY 11/14/2007  . SECONDARY DM W/PERIPHERAL CIRC D/O UNCONTROLLED 11/07/2010  . SYNCOPE 09/08/2008  . Unspecified hearing loss 05/13/2009  . Prostate cancer   . Myalgia and myositis, unspecified 04/24/2011  . Other malaise and fatigue 04/24/2011  . Peripheral neuropathy 03/27/2011  . Seborrheic keratosis 06/05/2011  . Osteoarthrosis, unspecified whether generalized or localized, unspecified site   . Ischemic cardiomyopathy     a. echo (10/15):  mod focal basal septal hypertrophy, EF 40-45%, inf AK, Gr 1 DD, Al sclerosis, mild AI, MAC, mild MR, mod LAE, lipomatous hypertrophy, small effusion (no hemodynamic compromise)     Past Surgical History  Procedure Laterality Date  . S/p coronary stent x 1    . Cholecystectomy    . S/p bilat knee replacement  Cleon Gustin, MD  . Pacemaker insertion  2012    Caryl Comes, MD  . Prostate needle biopsy    . Cystoscopy    . Appendectomy    . Coronary artery bypass graft  2007    Current Outpatient Prescriptions  Medication  Sig Dispense Refill  . alendronate (FOSAMAX) 70 MG tablet Take 70 mg by mouth every Saturday. Take with a full glass of water on an empty stomach.      Marland Kitchen aspirin EC 81 MG tablet Take 81 mg by mouth at bedtime.      Marland Kitchen atorvastatin (LIPITOR) 40 MG tablet Take 40 mg by mouth daily. To control cholesterol      . feeding supplement, GLUCERNA SHAKE, (GLUCERNA SHAKE) LIQD Take 237 mLs by mouth 2 (two) times daily between  meals.    0  . fenofibrate 160 MG tablet Take 160 mg by mouth daily. To control cholesterol      . furosemide (LASIX) 20 MG tablet Take 1 tablet (20 mg total) by mouth every other day.  30 tablet    . Memantine HCl ER 14 MG CP24 Take 28 mg by mouth at bedtime.      . metFORMIN (GLUCOPHAGE) 500 MG tablet One each morning to control glucose  90 tablet  3  . nitroGLYCERIN (NITROSTAT) 0.4 MG SL tablet Place 0.4 mg under the tongue every 5 (five) minutes as needed for chest pain.      Marland Kitchen solifenacin (VESICARE) 10 MG tablet Take 10 mg by mouth daily. To decrease urinary urgency      . tamsulosin (FLOMAX) 0.4 MG CAPS capsule Take 0.4 mg by mouth daily after breakfast. For bladder       No current facility-administered medications for this visit.    No Known Allergies  Review of Systems negative except from HPI and PMH  Physical Exam BP 146/74  Pulse 67  Ht 5\' 9"  (1.753 m)  Wt 175 lb 12.8 oz (79.742 kg)  BMI 25.95 kg/m2 Well developed and well nourished in no acute distress HENT normal E scleral and icterus clear Neck Supple JVP flat;   Clear to ausculation  Device pocket well healed; without hematoma or erythema.  There is no tethering Regular rate and rhythm, 2/6 murmur gallops or rub No clubbing cyanosis none Edema Alert and oriented, grossly normal motor and sensory function Skin Warm and Dry     Assessment and  Plan  Renal insufficiency  Atrial fibrillation  Syncope  Complete heart block-intermittent  Pacemaker-St. Jude  Debility  The patient has had atrial fibrillation detected on his device of 4 hours duration. It would be reasonable to consider reintroduction of anticoagulation I will defer this to Dr. Stanford Breed  There were no issues in the hospital with his creatinine increasing with daily diuretics. He was discharged on daily diuretics. This was not the understanding of his daughter. We will recheck a metabolic profile next week in his diuretics will need to be  adjusted accordingly.  Pacemaker function is normal  There have been no intercurrent syncope

## 2014-10-15 NOTE — Telephone Encounter (Signed)
Left message to call back  

## 2014-10-18 ENCOUNTER — Encounter: Payer: Self-pay | Admitting: Internal Medicine

## 2014-10-18 ENCOUNTER — Ambulatory Visit: Payer: Medicare Other

## 2014-10-18 DIAGNOSIS — R2689 Other abnormalities of gait and mobility: Secondary | ICD-10-CM | POA: Diagnosis not present

## 2014-10-18 DIAGNOSIS — E119 Type 2 diabetes mellitus without complications: Secondary | ICD-10-CM | POA: Diagnosis not present

## 2014-10-18 DIAGNOSIS — Z5189 Encounter for other specified aftercare: Secondary | ICD-10-CM | POA: Diagnosis present

## 2014-10-18 DIAGNOSIS — Z96653 Presence of artificial knee joint, bilateral: Secondary | ICD-10-CM | POA: Diagnosis not present

## 2014-10-18 DIAGNOSIS — I10 Essential (primary) hypertension: Secondary | ICD-10-CM | POA: Diagnosis not present

## 2014-10-18 DIAGNOSIS — Z950 Presence of cardiac pacemaker: Secondary | ICD-10-CM | POA: Diagnosis not present

## 2014-10-18 DIAGNOSIS — R269 Unspecified abnormalities of gait and mobility: Secondary | ICD-10-CM | POA: Diagnosis not present

## 2014-10-18 DIAGNOSIS — M1990 Unspecified osteoarthritis, unspecified site: Secondary | ICD-10-CM | POA: Diagnosis not present

## 2014-10-21 ENCOUNTER — Ambulatory Visit: Payer: Medicare Other

## 2014-10-21 DIAGNOSIS — Z5189 Encounter for other specified aftercare: Secondary | ICD-10-CM | POA: Diagnosis not present

## 2014-10-23 ENCOUNTER — Other Ambulatory Visit: Payer: Self-pay | Admitting: Internal Medicine

## 2014-10-25 ENCOUNTER — Encounter: Payer: Self-pay | Admitting: Cardiology

## 2014-10-25 ENCOUNTER — Ambulatory Visit (INDEPENDENT_AMBULATORY_CARE_PROVIDER_SITE_OTHER): Payer: Medicare Other | Admitting: Cardiology

## 2014-10-25 ENCOUNTER — Other Ambulatory Visit: Payer: Self-pay | Admitting: Cardiology

## 2014-10-25 ENCOUNTER — Telehealth: Payer: Self-pay | Admitting: *Deleted

## 2014-10-25 ENCOUNTER — Ambulatory Visit: Payer: Medicare Other

## 2014-10-25 VITALS — BP 110/50 | HR 78 | Ht 69.5 in | Wt 180.0 lb

## 2014-10-25 DIAGNOSIS — I480 Paroxysmal atrial fibrillation: Secondary | ICD-10-CM

## 2014-10-25 DIAGNOSIS — N259 Disorder resulting from impaired renal tubular function, unspecified: Secondary | ICD-10-CM

## 2014-10-25 DIAGNOSIS — R0602 Shortness of breath: Secondary | ICD-10-CM

## 2014-10-25 DIAGNOSIS — I5042 Chronic combined systolic (congestive) and diastolic (congestive) heart failure: Secondary | ICD-10-CM

## 2014-10-25 DIAGNOSIS — I2510 Atherosclerotic heart disease of native coronary artery without angina pectoris: Secondary | ICD-10-CM

## 2014-10-25 DIAGNOSIS — Z950 Presence of cardiac pacemaker: Secondary | ICD-10-CM

## 2014-10-25 DIAGNOSIS — I10 Essential (primary) hypertension: Secondary | ICD-10-CM

## 2014-10-25 DIAGNOSIS — I2583 Coronary atherosclerosis due to lipid rich plaque: Secondary | ICD-10-CM

## 2014-10-25 LAB — BASIC METABOLIC PANEL WITH GFR
BUN: 33 mg/dL — AB (ref 6–23)
CO2: 26 meq/L (ref 19–32)
Calcium: 9.2 mg/dL (ref 8.4–10.5)
Chloride: 102 mEq/L (ref 96–112)
Creat: 1.37 mg/dL — ABNORMAL HIGH (ref 0.50–1.35)
GFR, EST AFRICAN AMERICAN: 52 mL/min — AB
GFR, Est Non African American: 45 mL/min — ABNORMAL LOW
GLUCOSE: 139 mg/dL — AB (ref 70–99)
POTASSIUM: 4.5 meq/L (ref 3.5–5.3)
SODIUM: 139 meq/L (ref 135–145)

## 2014-10-25 NOTE — Assessment & Plan Note (Signed)
PatientNoted previously to have paroxysmal atrial fibrillation on monitor. Check renal function and then make decision concerning NOAC vs coumadin.

## 2014-10-25 NOTE — Telephone Encounter (Signed)
Need faxed order for labs - ordered today. Requisition printed and faxed to [phone]

## 2014-10-25 NOTE — Assessment & Plan Note (Signed)
Followed by electrophysiology. 

## 2014-10-25 NOTE — Assessment & Plan Note (Signed)
Continue statin. 

## 2014-10-25 NOTE — Assessment & Plan Note (Signed)
Continue aspirin and statin. 

## 2014-10-25 NOTE — Assessment & Plan Note (Signed)
Blood pressure controlled. Continue present medications. 

## 2014-10-25 NOTE — Progress Notes (Signed)
HPI: FU CAD. Patient had CABG 1997. Abd ultrasound 8/07 showed no aneurysm. Inferior MI 11/07; Severe 3 vessel disease; occluded SVG to RCA; SVG to OM subtotalled, SVG to diagonal with ostial 70 and 80 mid; EF 45. PCI of SVG to OM. FU myovue showed EF 56 with inferior infarct and very mild peri-infarct ischemia. ABI 5/12-left mildly abnormal; normal right. Carotid dopplers 12/14 showed 1-39% bilateral stenosis. Last echocardiogram October 2015 showed an ejection fraction of 40-45%. There is grade 1 diastolic dysfunction. There was mild aortic and mitral regurgitation. There was a small pericardial effusion and moderate left atrial enlargement. Also s/p pacemaker. Admitted in October 2015 with congestive heart failure. Improved with diuresis but some deterioration in renal function. Saw Dr. Caryl Comes back in follow-up in October and atrial fibrillation noted on his device. Since last seen., He denies dyspnea but is now on home oxygen. No orthopnea, PND or pedal edema. No chest pain, palpitations or syncope. His daughter is now staying with him.  Current Outpatient Prescriptions  Medication Sig Dispense Refill  . alendronate (FOSAMAX) 70 MG tablet Take 70 mg by mouth every Saturday. Take with a full glass of water on an empty stomach.    Marland Kitchen aspirin EC 81 MG tablet Take 81 mg by mouth at bedtime.    Marland Kitchen atorvastatin (LIPITOR) 40 MG tablet Take 40 mg by mouth daily. To control cholesterol    . feeding supplement, GLUCERNA SHAKE, (GLUCERNA SHAKE) LIQD Take 237 mLs by mouth 2 (two) times daily between meals.  0  . fenofibrate 160 MG tablet Take 160 mg by mouth daily. To control cholesterol    . furosemide (LASIX) 20 MG tablet Take 1 tablet (20 mg total) by mouth every other day. 30 tablet   . Memantine HCl ER 14 MG CP24 Take 28 mg by mouth at bedtime.    . metFORMIN (GLUCOPHAGE) 500 MG tablet One each morning to control glucose 90 tablet 3  . nitroGLYCERIN (NITROSTAT) 0.4 MG SL tablet Place 0.4 mg under the  tongue every 5 (five) minutes as needed for chest pain.    Marland Kitchen solifenacin (VESICARE) 10 MG tablet Take 10 mg by mouth daily. To decrease urinary urgency    . tamsulosin (FLOMAX) 0.4 MG CAPS capsule Take 0.4 mg by mouth daily after breakfast. For bladder     No current facility-administered medications for this visit.     Past Medical History  Diagnosis Date  . ANEMIA-IRON DEFICIENCY 05/27/2007  . ANXIETY 11/14/2007  . Intermittent high-grade heart block 09/05/2009    s/p pacemaker  . BENIGN PROSTATIC HYPERTROPHY 11/14/2007  . CAROTID ARTERY DISEASE 10/02/2010  . Pacemaker-St. Jude 02/02/2008    St. Jude  . COLONIC POLYPS, HX OF 05/27/2007  . CORONARY ARTERY DISEASE 11/14/2007    prior bypass [phone]; Inferior MI 11/07; Severe 3 vessel disease; occluded SVG to RCA; SVG to OM subtotalled, SVG to diagonal with ostial 70 and 80 mid; EF 45. PCI of SVG to OM. FU myovue showed EF 56 with inferior infarct and very mild peri-infarct ischemia  . DEPRESSION 11/14/2007  . DIABETES MELLITUS, TYPE II 11/14/2007  . DIVERTICULITIS, HX OF 05/27/2007  . DVT (deep venous thrombosis) 03/21/2011  . FOOT PAIN, BILATERAL 12/08/2010  . GERD 11/14/2007  . HYPERLIPIDEMIA 11/14/2007  . HYPERTENSION 11/14/2007  . HYPOTENSION, ORTHOSTATIC 03/06/2011  . Memory loss 06/01/2008  . OSTEOPOROSIS 06/02/2010  . PERIPHERAL VASCULAR DISEASE 11/14/2007  . RENAL INSUFFICIENCY 11/14/2007  . SECONDARY DM W/PERIPHERAL CIRC  D/O UNCONTROLLED 11/07/2010  . SYNCOPE 09/08/2008  . Unspecified hearing loss 05/13/2009  . Prostate cancer   . Myalgia and myositis, unspecified 04/24/2011  . Other malaise and fatigue 04/24/2011  . Peripheral neuropathy 03/27/2011  . Seborrheic keratosis 06/05/2011  . Osteoarthrosis, unspecified whether generalized or localized, unspecified site   . Ischemic cardiomyopathy     a. echo (10/15):  mod focal basal septal hypertrophy, EF 40-45%, inf AK, Gr 1 DD, Al sclerosis, mild AI, MAC, mild MR, mod LAE, lipomatous  hypertrophy, small effusion (no hemodynamic compromise)     Past Surgical History  Procedure Laterality Date  . S/p coronary stent x 1    . Cholecystectomy    . S/p bilat knee replacement  Cleon Gustin, MD  . Pacemaker insertion  2012    Caryl Comes, MD  . Prostate needle biopsy    . Cystoscopy    . Appendectomy    . Coronary artery bypass graft  2007    History   Social History  . Marital Status: Widowed    Spouse Name: N/A    Number of Children: 2  . Years of Education: College   Occupational History  . retired operated [employer]  . Smoking status: Former Smoker    Quit date: 10/18/1983  . Smokeless tobacco: Never Used  . Alcohol Use: No  . Drug Use: No  . Sexual Activity: No   Other Topics Concern  . Not on file   Social History Narrative   Patient is widowed with 2 children.   Patient has a college education.   Patient is right handed.   Patient drinks 2 cups daily.    ROS: no fevers or chills, productive cough, hemoptysis, dysphasia, odynophagia, melena, hematochezia, dysuria, hematuria, rash, seizure activity, orthopnea, PND, pedal edema, claudication. Remaining systems are negative.  Physical Exam: Well-developed frail in no acute distress.  Skin is warm and dry.  HEENT is normal.  Neck is supple.  Chest is clear to auscultation with normal expansion.  Cardiovascular exam is regular rate and rhythm. 2/6 systolic ejection murmur Abdominal exam nontender or distended. No masses palpated. Extremities show no edema. neuro grossly intact

## 2014-10-25 NOTE — Patient Instructions (Signed)
Your physician recommends that you schedule a follow-up appointment in: 3 MONTHS WITH DR CRENSHAW  

## 2014-10-25 NOTE — Assessment & Plan Note (Signed)
Patient is euvolemic on examination. Continue present dose of Lasix. Check potassium, renal function and BNP.

## 2014-10-26 LAB — BRAIN NATRIURETIC PEPTIDE: Brain Natriuretic Peptide: 152.5 pg/mL — ABNORMAL HIGH (ref 0.0–100.0)

## 2014-10-27 ENCOUNTER — Other Ambulatory Visit: Payer: Self-pay | Admitting: *Deleted

## 2014-10-27 DIAGNOSIS — I480 Paroxysmal atrial fibrillation: Secondary | ICD-10-CM

## 2014-10-27 DIAGNOSIS — Z7901 Long term (current) use of anticoagulants: Secondary | ICD-10-CM

## 2014-10-27 DIAGNOSIS — I82409 Acute embolism and thrombosis of unspecified deep veins of unspecified lower extremity: Secondary | ICD-10-CM

## 2014-10-27 MED ORDER — RIVAROXABAN 15 MG PO TABS: 15.0000 mg | ORAL_TABLET | Freq: Every day | ORAL | Status: DC

## 2014-10-28 ENCOUNTER — Ambulatory Visit: Payer: Medicare Other | Attending: Neurology

## 2014-10-28 DIAGNOSIS — Z96653 Presence of artificial knee joint, bilateral: Secondary | ICD-10-CM | POA: Diagnosis not present

## 2014-10-28 DIAGNOSIS — R269 Unspecified abnormalities of gait and mobility: Secondary | ICD-10-CM | POA: Insufficient documentation

## 2014-10-28 DIAGNOSIS — R2689 Other abnormalities of gait and mobility: Secondary | ICD-10-CM | POA: Insufficient documentation

## 2014-10-28 DIAGNOSIS — I10 Essential (primary) hypertension: Secondary | ICD-10-CM | POA: Insufficient documentation

## 2014-10-28 DIAGNOSIS — Z950 Presence of cardiac pacemaker: Secondary | ICD-10-CM | POA: Diagnosis not present

## 2014-10-28 DIAGNOSIS — Z5189 Encounter for other specified aftercare: Secondary | ICD-10-CM | POA: Insufficient documentation

## 2014-10-28 DIAGNOSIS — M1990 Unspecified osteoarthritis, unspecified site: Secondary | ICD-10-CM | POA: Diagnosis not present

## 2014-10-28 DIAGNOSIS — E119 Type 2 diabetes mellitus without complications: Secondary | ICD-10-CM | POA: Diagnosis not present

## 2014-10-28 NOTE — Therapy (Signed)
Physical Therapy Treatment  Patient Details  Name: Zachary Harris MRN: [id number] Date of Birth: May 03, 1923  Encounter Date: 10/28/2014      PT End of Session - 10/28/14 1155    Visit Number 4   Number of Visits 17   PT Start Time 1104   PT Stop Time 1145   PT Time Calculation (min) 41 min   Equipment Utilized During Treatment Gait belt   Activity Tolerance Patient tolerated treatment well      Past Medical History  Diagnosis Date  . ANEMIA-IRON DEFICIENCY 05/27/2007  . ANXIETY 11/14/2007  . Intermittent high-grade heart block 09/05/2009    s/p pacemaker  . BENIGN PROSTATIC HYPERTROPHY 11/14/2007  . CAROTID ARTERY DISEASE 10/02/2010  . Pacemaker-St. Jude 02/02/2008    St. Jude  . COLONIC POLYPS, HX OF 05/27/2007  . CORONARY ARTERY DISEASE 11/14/2007    prior bypass [phone]; Inferior MI 11/07; Severe 3 vessel disease; occluded SVG to RCA; SVG to OM subtotalled, SVG to diagonal with ostial 70 and 80 mid; EF 45. PCI of SVG to OM. FU myovue showed EF 56 with inferior infarct and very mild peri-infarct ischemia  . DEPRESSION 11/14/2007  . DIABETES MELLITUS, TYPE II 11/14/2007  . DIVERTICULITIS, HX OF 05/27/2007  . DVT (deep venous thrombosis) 03/21/2011  . FOOT PAIN, BILATERAL 12/08/2010  . GERD 11/14/2007  . HYPERLIPIDEMIA 11/14/2007  . HYPERTENSION 11/14/2007  . HYPOTENSION, ORTHOSTATIC 03/06/2011  . Memory loss 06/01/2008  . OSTEOPOROSIS 06/02/2010  . PERIPHERAL VASCULAR DISEASE 11/14/2007  . RENAL INSUFFICIENCY 11/14/2007  . SECONDARY DM W/PERIPHERAL CIRC D/O UNCONTROLLED 11/07/2010  . SYNCOPE 09/08/2008  . Unspecified hearing loss 05/13/2009  . Prostate cancer   . Myalgia and myositis, unspecified 04/24/2011  . Other malaise and fatigue 04/24/2011  . Peripheral neuropathy 03/27/2011  . Seborrheic keratosis 06/05/2011  . Osteoarthrosis, unspecified whether generalized or localized, unspecified site   . Ischemic cardiomyopathy     a. echo (10/15):  mod focal basal septal  hypertrophy, EF 40-45%, inf AK, Gr 1 DD, Al sclerosis, mild AI, MAC, mild MR, mod LAE, lipomatous hypertrophy, small effusion (no hemodynamic compromise)     Past Surgical History  Procedure Laterality Date  . S/p coronary stent x 1    . Cholecystectomy    . S/p bilat knee replacement  Cleon Gustin, MD  . Pacemaker insertion  2012    Caryl Comes, MD  . Prostate needle biopsy    . Cystoscopy    . Appendectomy    . Coronary artery bypass graft  2007    Pulse   SpO2   Visit Diagnosis:  Abnormality of gait            Education - 10/28/14 1155    Education provided Yes   Education Details OTAGO exercise program   Education Details Patient;Child(ren);Caregiver(s)   Methods Explanation;Demonstration;Tactile cues;Verbal cues   Comprehension Verbalized understanding;Returned demonstration     OTAGO exercise program performed during this treatment session, please see balance exercise (OTAGO) flowsheet for details.       PT Long Term Goals - 10/28/14 1201    PT LONG TERM GOAL #1   Title Pt will verbalize understanding of fall prevention strategies within home environment.   Time --  11/03/14   Status On-going   PT LONG TERM GOAL #2   Title Pt will be able to improve BERG balance score to >/= 47/56 for improved balance.   Time --  11/03/14   Status On-going  PT LONG TERM GOAL #3   Title Pt will be able to improve timed up and go to < 17 sec for improved mobility.   Time --  11/03/14   Status On-going   PT LONG TERM GOAL #4   Title Pt will be able to ambulate > 450' with LRAD modified independent on indoor/paved outdoor surfaces for improved mobility.   Time --  11/03/14   Status On-going          Plan - 10/28/14 1158    Clinical Impression Statement Pt demonstrated progress today, as he did not require rest breaks during session. Pt easily corrected technique with VCs. Pt would continue to benefit from skilled therapy to improve safety during functional mobility.    Pt will benefit from skilled therapeutic intervention in order to improve on the following deficits Abnormal gait;Decreased activity tolerance;Decreased balance;Decreased mobility;Difficulty walking;Decreased strength   Rehab Potential Good   PT Frequency Min 2X/week   PT Duration 4 weeks   PT Treatment/Interventions DME instruction;Neuromuscular re-education;Balance training;Gait training;Stair training;Functional mobility training;Patient/family education;Modalities;Therapeutic activities;Therapeutic exercise;Manual techniques   PT Plan Dynamic gait and balance training, with focus on improve heel strike and stride length.        Problem List Patient Active Problem List   Diagnosis Date Noted  . Paroxysmal atrial fibrillation 10/25/2014  . Malnutrition of moderate degree 10/10/2014  . CKD (chronic kidney disease) 10/10/2014  . Ischemic cardiomyopathy 10/10/2014  . Chronic combined systolic and diastolic CHF (congestive heart failure) 10/10/2014  . Weight loss 10/10/2014  . SOB (shortness of breath) 10/08/2014  . Memory change 07/13/2014  . Other malaise and fatigue 08/16/2013  . Urinary tract infection, site not specified 08/16/2013  . Urine retention 08/16/2013  . Incomplete bladder emptying 08/16/2013  . Urge incontinence 08/16/2013  . Nocturia 08/16/2013  . Nodular prostate with urinary obstruction 08/16/2013  . Leukocytosis, unspecified 05/12/2013  . 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 Malignant neoplasm of prostate 02/25/2013  . HYPOTENSION, ORTHOSTATIC 03/06/2011  . DM2 (diabetes mellitus, type 2) 11/07/2010  . CAROTID ARTERY DISEASE 10/02/2010  . OSTEOPOROSIS 06/02/2010  . BACK PAIN 09/05/2009  . VERTEBRAL FRACTURE 09/05/2009  . Intermittent high-grade heart block 09/05/2009  . UNSPECIFIED HEARING LOSS 05/13/2009  . MEMORY LOSS 06/01/2008  . Pacemaker-St.Jude 02/02/2008  .  HYPERLIPIDEMIA 11/14/2007  . ANXIETY 11/14/2007  . DEPRESSION 11/14/2007  . Essential hypertension 11/14/2007  . MYOCARDIAL INFARCTION, HX OF 11/14/2007  . CAD (coronary artery disease) 11/14/2007  . PERIPHERAL VASCULAR DISEASE 11/14/2007  . GERD 11/14/2007  . Disorder resulting from impaired renal function 11/14/2007  . BENIGN PROSTATIC HYPERTROPHY 11/14/2007  . ANEMIA-IRON DEFICIENCY 05/27/2007  . COLONIC POLYPS, HX OF 05/27/2007                                            Tycen Dockter L 10/28/2014, 2:24 PM

## 2014-11-01 ENCOUNTER — Telehealth: Payer: Self-pay | Admitting: Cardiology

## 2014-11-01 ENCOUNTER — Ambulatory Visit: Payer: Medicare Other | Admitting: Physical Therapy

## 2014-11-01 ENCOUNTER — Encounter: Payer: Self-pay | Admitting: Physical Therapy

## 2014-11-01 DIAGNOSIS — Z5189 Encounter for other specified aftercare: Secondary | ICD-10-CM | POA: Diagnosis not present

## 2014-11-01 DIAGNOSIS — R269 Unspecified abnormalities of gait and mobility: Secondary | ICD-10-CM

## 2014-11-01 LAB — BASIC METABOLIC PANEL
BUN: 38 mg/dL — AB (ref 4–21)
Creatinine: 1.2 mg/dL (ref 0.6–1.3)
Glucose: 123 mg/dL
POTASSIUM: 4.6 mmol/L (ref 3.4–5.3)
SODIUM: 140 mmol/L (ref 137–147)

## 2014-11-01 LAB — LIPID PANEL
CHOLESTEROL: 115 mg/dL (ref 0–200)
HDL: 33 mg/dL — AB (ref 35–70)
LDL Cholesterol: 48 mg/dL
LDL/HDL RATIO: 3.5
TRIGLYCERIDES: 171 mg/dL — AB (ref 40–160)

## 2014-11-01 LAB — HEMOGLOBIN A1C: Hgb A1c MFr Bld: 7 % — AB (ref 4.0–6.0)

## 2014-11-01 NOTE — Telephone Encounter (Signed)
New message    Daughter calling regarding prior authorization - xarelto.     [facility].

## 2014-11-01 NOTE — Telephone Encounter (Signed)
Spoke with pt dtr, PA for xarelto complete. [id number]

## 2014-11-01 NOTE — Patient Instructions (Signed)
Fall Prevention and Home Safety °Falls cause injuries and can affect all age groups. It is possible to prevent falls.  °HOW TO PREVENT FALLS °· Wear shoes with rubber soles that do not have an opening for your toes. °· Keep the inside and outside of your house well lit. °· Use night lights throughout your home. °· Remove clutter from floors. °· Clean up floor spills. °· Remove throw rugs or fasten them to the floor with carpet tape. °· Do not place electrical cords across pathways. °· Put grab bars by your tub, shower, and toilet. Do not use towel bars as grab bars. °· Put handrails on both sides of the stairway. Fix loose handrails. °· Do not climb on stools or stepladders, if possible. °· Do not wax your floors. °· Repair uneven or unsafe sidewalks, walkways, or stairs. °· Keep items you use a lot within reach. °· Be aware of pets. °· Keep emergency numbers next to the telephone. °· Put smoke detectors in your home and near bedrooms. °Ask your doctor what other things you can do to prevent falls. °Document Released: 10/06/2009 Document Revised: 06/10/2012 Document Reviewed: 03/11/2012 °ExitCare® Patient Information ©2015 [employer]. This information is not intended to replace advice given to you by your health care provider. Make sure you discuss any questions you have with your health care provider. ° °

## 2014-11-01 NOTE — Therapy (Signed)
Physical Therapy Treatment  Patient Details  Name: Zachary Harris MRN: [id number] Date of Birth: 23-Jan-1923  Encounter Date: 11/01/2014      PT End of Session - 11/01/14 1105    Visit Number 5   Number of Visits 17   Date for PT Re-Evaluation 11/03/14   PT Start Time 1020   PT Stop Time 1100   PT Time Calculation (min) 40 min   Equipment Utilized During Treatment Gait belt   Activity Tolerance Patient tolerated treatment well      Past Medical History  Diagnosis Date  . ANEMIA-IRON DEFICIENCY 05/27/2007  . ANXIETY 11/14/2007  . Intermittent high-grade heart block 09/05/2009    s/p pacemaker  . BENIGN PROSTATIC HYPERTROPHY 11/14/2007  . CAROTID ARTERY DISEASE 10/02/2010  . Pacemaker-St. Jude 02/02/2008    St. Jude  . COLONIC POLYPS, HX OF 05/27/2007  . CORONARY ARTERY DISEASE 11/14/2007    prior bypass [phone]; Inferior MI 11/07; Severe 3 vessel disease; occluded SVG to RCA; SVG to OM subtotalled, SVG to diagonal with ostial 70 and 80 mid; EF 45. PCI of SVG to OM. FU myovue showed EF 56 with inferior infarct and very mild peri-infarct ischemia  . DEPRESSION 11/14/2007  . DIABETES MELLITUS, TYPE II 11/14/2007  . DIVERTICULITIS, HX OF 05/27/2007  . DVT (deep venous thrombosis) 03/21/2011  . FOOT PAIN, BILATERAL 12/08/2010  . GERD 11/14/2007  . HYPERLIPIDEMIA 11/14/2007  . HYPERTENSION 11/14/2007  . HYPOTENSION, ORTHOSTATIC 03/06/2011  . Memory loss 06/01/2008  . OSTEOPOROSIS 06/02/2010  . PERIPHERAL VASCULAR DISEASE 11/14/2007  . RENAL INSUFFICIENCY 11/14/2007  . SECONDARY DM W/PERIPHERAL CIRC D/O UNCONTROLLED 11/07/2010  . SYNCOPE 09/08/2008  . Unspecified hearing loss 05/13/2009  . Prostate cancer   . Myalgia and myositis, unspecified 04/24/2011  . Other malaise and fatigue 04/24/2011  . Peripheral neuropathy 03/27/2011  . Seborrheic keratosis 06/05/2011  . Osteoarthrosis, unspecified whether generalized or localized, unspecified site   . Ischemic cardiomyopathy     a. echo  (10/15):  mod focal basal septal hypertrophy, EF 40-45%, inf AK, Gr 1 DD, Al sclerosis, mild AI, MAC, mild MR, mod LAE, lipomatous hypertrophy, small effusion (no hemodynamic compromise)     Past Surgical History  Procedure Laterality Date  . S/p coronary stent x 1    . Cholecystectomy    . S/p bilat knee replacement  Cleon Gustin, MD  . Pacemaker insertion  2012    Caryl Comes, MD  . Prostate needle biopsy    . Cystoscopy    . Appendectomy    . Coronary artery bypass graft  2007    There were no vitals taken for this visit.  Visit Diagnosis:  Abnormality of gait          [facility] Adult PT Treatment/Exercise - 11/01/14 1038    Transfers   Transfers Sit to Stand;Stand to Sit   Sit to Stand 5: Supervision;4: Min guard   Sit to Stand Details (indicate cue type and reason) cues to scoot to edge of surface and for ant weight shift and UE use   Stand to Sit 5: Supervision;4: Min guard   Stand to Sit Details cues to use hand to slow descent and to back all the way to surface before sitting down   Ambulation/Gait   Ambulation/Gait Yes   Ambulation/Gait Assistance 4: Min guard   Ambulation/Gait Assistance Details needed cues on posture, to increase stride length and for walker proximity with gait  Ambulation Distance (Feet) 415 Feet   Assistive device 4-wheeled walker   Gait Pattern Step-through pattern;Shuffle;Trunk flexed;Narrow base of support   Gait velocity decreased   Ramp 5: Supervision   Ramp Details (indicate cue type and reason) with rollator, cues on posture and walker proximity with gait.   Timed Up and Go Test   TUG Normal TUG   Normal TUG (seconds) 20.78  with rollator          Education - 11/01/14 1156    Education provided Yes   Education Details Fall prevention education   Education Details Patient;Caregiver(s)   Methods Explanation;Demonstration;Handout   Comprehension Verbalized understanding;Returned demonstration     Self Care: Pt  and caregiver educated on fall prevention education. Handouts given with AVS. Verbally reviewed OTAGO HEP with both as well.         PT Long Term Goals - 11/01/14 1216    PT LONG TERM GOAL #1   Title Pt will verbalize understanding of fall prevention strategies within home environment.- 11/03/2014   Time 3   Period Days   Status Achieved   PT LONG TERM GOAL #2   Title Pt will be able to improve BERG balance score to >/= 47/56 for improved balance.   Time 3   Period Days   Status On-going   PT LONG TERM GOAL #3   Title Pt will be able to improve timed up and go to < 17 sec for improved mobility.   Time 3   Period Days   Status On-going   PT LONG TERM GOAL #4   Title Pt will be able to ambulate > 450' with LRAD modified independent on indoor/paved outdoor surfaces for improved mobility.   Time 3   Period Days   Status On-going          Plan - 11/01/14 1156    Clinical Impression Statement Pt making steady progress with slightly improved TUG score today. Pt/caregiver to perform OTAGO program before next visit and bring any questions if any at that time.   Pt will benefit from skilled therapeutic intervention in order to improve on the following deficits Abnormal gait;Decreased activity tolerance;Decreased balance;Difficulty walking;Decreased endurance   Rehab Potential Good   PT Frequency 2x / week   PT Duration 4 weeks   PT Treatment/Interventions Therapeutic activities;Neuromuscular re-education;Manual techniques;Gait training;Therapeutic exercise;Patient/family education;ADLs/Self Care Home Management;Other (comment)  HEP, vestibular rehabilitation, modalities   PT Next Visit Plan Assess remaining LTG's for discharge per PT POC.   Consulted and Agree with Plan of Care Patient        Problem List Patient Active Problem List   Diagnosis Date Noted  . Paroxysmal atrial fibrillation 10/25/2014  . Malnutrition of moderate degree 10/10/2014  . CKD (chronic kidney disease)  10/10/2014  . Ischemic cardiomyopathy 10/10/2014  . Chronic combined systolic and diastolic CHF (congestive heart failure) 10/10/2014  . Weight loss 10/10/2014  . SOB (shortness of breath) 10/08/2014  . Memory change 07/13/2014  . Other malaise and fatigue 08/16/2013  . Urinary tract infection, site not specified 08/16/2013  . Urine retention 08/16/2013  . Incomplete bladder emptying 08/16/2013  . Urge incontinence 08/16/2013  . Nocturia 08/16/2013  . Nodular prostate with urinary obstruction 08/16/2013  . Leukocytosis, unspecified 05/12/2013  . 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 Malignant neoplasm of prostate 02/25/2013  . HYPOTENSION, ORTHOSTATIC 03/06/2011  . DM2 (diabetes mellitus, type 2) 11/07/2010  . CAROTID ARTERY DISEASE 10/02/2010  .  OSTEOPOROSIS 06/02/2010  . BACK PAIN 09/05/2009  . VERTEBRAL FRACTURE 09/05/2009  . Intermittent high-grade heart block 09/05/2009  . UNSPECIFIED HEARING LOSS 05/13/2009  . MEMORY LOSS 06/01/2008  . Pacemaker-St.Jude 02/02/2008  . HYPERLIPIDEMIA 11/14/2007  . ANXIETY 11/14/2007  . DEPRESSION 11/14/2007  . Essential hypertension 11/14/2007  . MYOCARDIAL INFARCTION, HX OF 11/14/2007  . CAD (coronary artery disease) 11/14/2007  . PERIPHERAL VASCULAR DISEASE 11/14/2007  . GERD 11/14/2007  . Disorder resulting from impaired renal function 11/14/2007  . BENIGN PROSTATIC HYPERTROPHY 11/14/2007  . ANEMIA-IRON DEFICIENCY 05/27/2007  . COLONIC POLYPS, HX OF 05/27/2007            Willow Ora 11/01/2014, 12:19 PM

## 2014-11-02 NOTE — Telephone Encounter (Addendum)
Xarelto 15mg  approved from 11/01/2014 until 11/02/2015 under medicare part D  Pharmacy notified

## 2014-11-04 ENCOUNTER — Encounter: Payer: Self-pay | Admitting: Physical Therapy

## 2014-11-04 ENCOUNTER — Ambulatory Visit: Payer: Medicare Other | Admitting: Physical Therapy

## 2014-11-04 DIAGNOSIS — Z5189 Encounter for other specified aftercare: Secondary | ICD-10-CM | POA: Diagnosis not present

## 2014-11-04 DIAGNOSIS — R269 Unspecified abnormalities of gait and mobility: Secondary | ICD-10-CM

## 2014-11-04 NOTE — Therapy (Signed)
Physical Therapy Treatment  Patient Details  Name: Zachary Harris MRN: [id number] Date of Birth: March 07, 1923  Encounter Date: 11/04/2014      PT End of Session - 11/04/14 1026    Visit Number 6   Number of Visits 17   Date for PT Re-Evaluation 11/03/14   PT Start Time 1020   PT Stop Time 1054   PT Time Calculation (min) 34 min   Equipment Utilized During Treatment Gait belt   Activity Tolerance Patient tolerated treatment well      Past Medical History  Diagnosis Date  . ANEMIA-IRON DEFICIENCY 05/27/2007  . ANXIETY 11/14/2007  . Intermittent high-grade heart block 09/05/2009    s/p pacemaker  . BENIGN PROSTATIC HYPERTROPHY 11/14/2007  . CAROTID ARTERY DISEASE 10/02/2010  . Pacemaker-[employer] 02/02/2008    [employer]  . COLONIC POLYPS, HX OF 05/27/2007  . CORONARY ARTERY DISEASE 11/14/2007    prior bypass [phone]; Inferior MI 11/07; Severe 3 vessel disease; occluded SVG to RCA; SVG to OM subtotalled, SVG to diagonal with ostial 70 and 80 mid; EF 45. PCI of SVG to OM. FU myovue showed EF 56 with inferior infarct and very mild peri-infarct ischemia  . DEPRESSION 11/14/2007  . DIABETES MELLITUS, TYPE II 11/14/2007  . DIVERTICULITIS, HX OF 05/27/2007  . DVT (deep venous thrombosis) 03/21/2011  . FOOT PAIN, BILATERAL 12/08/2010  . GERD 11/14/2007  . HYPERLIPIDEMIA 11/14/2007  . HYPERTENSION 11/14/2007  . HYPOTENSION, ORTHOSTATIC 03/06/2011  . Memory loss 06/01/2008  . OSTEOPOROSIS 06/02/2010  . PERIPHERAL VASCULAR DISEASE 11/14/2007  . RENAL INSUFFICIENCY 11/14/2007  . SECONDARY DM W/PERIPHERAL CIRC D/O UNCONTROLLED 11/07/2010  . SYNCOPE 09/08/2008  . Unspecified hearing loss 05/13/2009  . Prostate cancer   . Myalgia and myositis, unspecified 04/24/2011  . Other malaise and fatigue 04/24/2011  . Peripheral neuropathy 03/27/2011  . Seborrheic keratosis 06/05/2011  . Osteoarthrosis, unspecified whether generalized or localized, unspecified site   . Ischemic cardiomyopathy     a. echo  (10/15):  mod focal basal septal hypertrophy, EF 40-45%, inf AK, Gr 1 DD, Al sclerosis, mild AI, MAC, mild MR, mod LAE, lipomatous hypertrophy, small effusion (no hemodynamic compromise)     Past Surgical History  Procedure Laterality Date  . S/p coronary stent x 1    . Cholecystectomy    . S/p bilat knee replacement  Cleon Gustin, MD  . Pacemaker insertion  2012    Caryl Comes, MD  . Prostate needle biopsy    . Cystoscopy    . Appendectomy    . Coronary artery bypass graft  2007    There were no vitals taken for this visit.  Visit Diagnosis:  Abnormality of gait      Subjective Assessment - 11/04/14 1026    Symptoms No new complaints. Did OTAGO the other day per caregiver report and no questions on it.   Currently in Pain? No/denies            [facility] Adult PT Treatment/Exercise - 11/04/14 1034    Transfers   Sit to Stand 5: Supervision   Sit to Stand Details (indicate cue type and reason) increased time needed   Stand to Sit 5: Supervision   Stand to Sit Details increased time needed   Ambulation/Gait   Ambulation/Gait Yes   Ambulation/Gait Assistance 5: Supervision   Ambulation/Gait Assistance Details cues on posture and walker position with gait. cues to increase foot clearance with gait to decrease shuffling. No balance loss noted with  gait.                   Ambulation Distance (Feet) 500 Feet  indoor level and outdoor paved surfaces combined   Assistive device 4-wheeled walker   Gait Pattern Step-through pattern;Decreased stride length;Trunk flexed;Shuffle   Gait velocity decreased   Berg Balance Test   Sit to Stand Able to stand  independently using hands   Standing Unsupported Able to stand safely 2 minutes   Sitting with Back Unsupported but Feet Supported on Floor or Stool Able to sit safely and securely 2 minutes   Stand to Sit Sits safely with minimal use of hands   Transfers Able to transfer safely, definite need of hands   Standing Unsupported with Eyes  Closed Able to stand 10 seconds safely   Standing Ubsupported with Feet Together Able to place feet together independently and stand 1 minute safely   From Standing, Reach Forward with Outstretched Arm Can reach forward >12 cm safely (5")  7 inches   From Standing Position, Pick up Object from Floor Able to pick up shoe safely and easily   From Standing Position, Turn to Look Behind Over each Shoulder Looks behind one side only/other side shows less weight shift  left > right side   Turn 360 Degrees Able to turn 360 degrees safely but slowly  >7 seconds each way   Standing Unsupported, Alternately Place Feet on Step/Stool Able to complete 4 steps without aid or supervision   Standing Unsupported, One Foot in Front Able to plae foot ahead of the other independently and hold 30 seconds   Standing on One Leg Tries to lift leg/unable to hold 3 seconds but remains standing independently   Total Score 44              PT Long Term Goals - 11-20-2014 1604    PT LONG TERM GOAL #1   Title Pt will verbalize understanding of fall prevention strategies within home environment.- 11/03/2014   Status Achieved   PT LONG TERM GOAL #2   Title Pt will be able to improve BERG balance score to >/= 47/56 for improved balance.   Status Not Met   PT LONG TERM GOAL #3   Status Not Met   PT LONG TERM GOAL #4   Title Pt will be able to ambulate > 450' with LRAD modified independent on indoor/paved outdoor surfaces for improved mobility.   Status Achieved          Plan - November 20, 2014 1601    Clinical Impression Statement Remaining LTG's assessed today for discharge per PT POC. Pt and caregiver agreeable to discharge today.   Pt will benefit from skilled therapeutic intervention in order to improve on the following deficits Abnormal gait;Decreased activity tolerance;Decreased balance;Difficulty walking;Decreased endurance   Rehab Potential Good   PT Frequency 2x / week   PT Duration 8 weeks   PT  Treatment/Interventions Therapeutic activities;Neuromuscular re-education;Manual techniques;Gait training;Therapeutic exercise;Patient/family education;ADLs/Self Care Home Management;Other (comment)  HEP, vestibular rehab, modalities   PT Next Visit Plan Discharge per PT POC.   Consulted and Agree with Plan of Care Patient;Family member/caregiver          G-Codes - 11-20-2014 1632    Functional Assessment Tool Used BERG: 44/56   Functional Limitation Mobility: Walking and moving around   Mobility: Walking and Moving Around Goal Status [phone]) At least 1 percent but less than 20 percent impaired, limited or restricted   Mobility: Walking and Moving  Around Discharge Status [phone]) At least 20 percent but less than 40 percent impaired, limited or restricted      Problem List Patient Active Problem List   Diagnosis Date Noted  . Paroxysmal atrial fibrillation 10/25/2014  . Malnutrition of moderate degree 10/10/2014  . CKD (chronic kidney disease) 10/10/2014  . Ischemic cardiomyopathy 10/10/2014  . Chronic combined systolic and diastolic CHF (congestive heart failure) 10/10/2014  . Weight loss 10/10/2014  . SOB (shortness of breath) 10/08/2014  . Memory change 07/13/2014  . Other malaise and fatigue 08/16/2013  . Urinary tract infection, site not specified 08/16/2013  . Urine retention 08/16/2013  . Incomplete bladder emptying 08/16/2013  . Urge incontinence 08/16/2013  . Nocturia 08/16/2013  . Nodular prostate with urinary obstruction 08/16/2013  . Leukocytosis, unspecified 05/12/2013  . 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 Malignant neoplasm of prostate 02/25/2013  . HYPOTENSION, ORTHOSTATIC 03/06/2011  . DM2 (diabetes mellitus, type 2) 11/07/2010  . CAROTID ARTERY DISEASE 10/02/2010  . OSTEOPOROSIS 06/02/2010  . BACK PAIN 09/05/2009  . VERTEBRAL FRACTURE 09/05/2009  . Intermittent high-grade heart  block 09/05/2009  . UNSPECIFIED HEARING LOSS 05/13/2009  . MEMORY LOSS 06/01/2008  . Pacemaker-[employer] 02/02/2008  . HYPERLIPIDEMIA 11/14/2007  . ANXIETY 11/14/2007  . DEPRESSION 11/14/2007  . Essential hypertension 11/14/2007  . MYOCARDIAL INFARCTION, HX OF 11/14/2007  . CAD (coronary artery disease) 11/14/2007  . PERIPHERAL VASCULAR DISEASE 11/14/2007  . GERD 11/14/2007  . Disorder resulting from impaired renal function 11/14/2007  . BENIGN PROSTATIC HYPERTROPHY 11/14/2007  . ANEMIA-IRON DEFICIENCY 05/27/2007  . COLONIC POLYPS, HX OF 05/27/2007           Miller,Jennifer L 11/04/2014, 4:40 PM  Miller,Jennifer L, PT 4:40 PM  [place]ow [place], PTA, [place] [address], [place], [place] [address] [phone]

## 2014-11-04 NOTE — Therapy (Signed)
  Patient Details  Name: Zachary Harris MRN: [id number] Date of Birth: 1923/09/09  Encounter Date: 11/04/2014 PHYSICAL THERAPY DISCHARGE SUMMARY  Visits from Start of Care: 6  Current functional level related to goals / functional outcomes:  PT LONG TERM GOAL #1     Title  Pt will verbalize understanding of fall prevention strategies within home environment.- 11/03/2014    Status  Achieved    PT LONG TERM GOAL #2    Title  Pt will be able to improve BERG balance score to >/= 47/56 for improved balance.    Status  Not Met    PT LONG TERM GOAL #3    Status  Not Met    PT LONG TERM GOAL #4    Title  Pt will be able to ambulate > 450' with LRAD modified independent on indoor/paved outdoor surfaces for improved mobility.    Status  Achieved             Remaining deficits: Pt continues to experience decreased strength and impaired balance. However, pt and pt's caregivers have been educated on HEP to continue to improve the impairments list above.   Education / Equipment: OTAGO exercise [employer].  Plan: Patient agrees to discharge.  Patient goals were partially met. Patient is being discharged due to being pleased with the current functional level.  ?????     Zachary Harris, PT 4:49 PM   Zachary Harris 11/04/2014, 4:47 PM

## 2014-11-07 ENCOUNTER — Encounter: Payer: Self-pay | Admitting: Cardiology

## 2014-11-07 ENCOUNTER — Other Ambulatory Visit: Payer: Self-pay | Admitting: Internal Medicine

## 2014-11-09 ENCOUNTER — Non-Acute Institutional Stay: Payer: Medicare Other | Admitting: Internal Medicine

## 2014-11-09 ENCOUNTER — Encounter: Payer: Self-pay | Admitting: Internal Medicine

## 2014-11-09 VITALS — BP 138/64 | HR 68 | Wt 179.0 lb

## 2014-11-09 DIAGNOSIS — I2583 Coronary atherosclerosis due to lipid rich plaque: Secondary | ICD-10-CM

## 2014-11-09 DIAGNOSIS — E1142 Type 2 diabetes mellitus with diabetic polyneuropathy: Secondary | ICD-10-CM

## 2014-11-09 DIAGNOSIS — R0600 Dyspnea, unspecified: Secondary | ICD-10-CM

## 2014-11-09 DIAGNOSIS — R413 Other amnesia: Secondary | ICD-10-CM

## 2014-11-09 DIAGNOSIS — G629 Polyneuropathy, unspecified: Secondary | ICD-10-CM

## 2014-11-09 DIAGNOSIS — H9193 Unspecified hearing loss, bilateral: Secondary | ICD-10-CM

## 2014-11-09 DIAGNOSIS — I2510 Atherosclerotic heart disease of native coronary artery without angina pectoris: Secondary | ICD-10-CM

## 2014-11-09 DIAGNOSIS — I255 Ischemic cardiomyopathy: Secondary | ICD-10-CM

## 2014-11-09 DIAGNOSIS — I10 Essential (primary) hypertension: Secondary | ICD-10-CM

## 2014-11-09 DIAGNOSIS — F32A Depression, unspecified: Secondary | ICD-10-CM

## 2014-11-09 DIAGNOSIS — I5042 Chronic combined systolic (congestive) and diastolic (congestive) heart failure: Secondary | ICD-10-CM

## 2014-11-09 DIAGNOSIS — F329 Major depressive disorder, single episode, unspecified: Secondary | ICD-10-CM

## 2014-11-09 HISTORY — DX: Dyspnea, unspecified: R06.00

## 2014-11-09 NOTE — Progress Notes (Signed)
Patient ID: Zachary Harris, male   DOB: 1923/01/13, 80 y.o.   MRN: [id number]    [place] of Service: Clinic (12)    No Known Allergies  Chief Complaint  Patient presents with  . Medical Management of Chronic Issues    blood pressure, blood sugar, depression. Here with daughter Zachary Harris, son Zachary Harris.   . multiple questions from daughter    should he get shingles vaccine? Wants Pulmonary referral, wants to continue PT here, he sleeps alot 10-12 hours at night and naps during the day, should he have an anti-depression pill.    HPI:  Zachary Harris is accompanied by his daughter and son-in-law on today's visit.  Multiple concerns were addressed.  Hospitalized 10/08/2014 through 10/10/2014. In the hospital dyspneic.  Daughter says he could walk no more than 5 or 6 steps before he stopped with dyspnea. Whilel hospitalized he was diuresed about 2 L of fluid.  The cardiologist told her that she did not feel that this was a significant cardiac issue.  She wondered if there was more of a pulmonary issue going on that could contribute to the dyspnea.  Apparently no further testing was done while hospitalized in regards to pulmonary issues.  The daughter is requesting pulmonary consultation at this time.  Hospital diagnoses included chronic combined systolic and diastolic congestive heart failure, coronary artery disease, ischemic cardiac apathy, and dyspnea.  Other concurrent diagnoses included CK-MB, diabetes mellitus, hypertension, and malnutrition.  Since returning home, he continues to be short of breath even on oxygen.  Oxygen was started during the hospitalization.  Patient has seen Dr. Krista Blue for his dementia.  He was started on memantine extended release 14 mg.  Seems to be tolerating this okay.patient and family are aware of his deterioration in memory.  Daughter states patient seems to be sleeping very long periods of time.  At night he may sleep 12 or 13 hours.  In the  afternoon he generally takes another nap around 3 hours.  Patient says he has always been a bit sad.  This has not changed any.  His son-in-law, who has worked with him for many years, says that he does not believe Zachary Harris has changed any in regards to his emotional outlook.there is no tearfulness, loss of appetite, or constitutional symptoms that might be related to depression.  The family says that he has lost interest in many things.  Activity has reduced.he does not seem to be withdrawn.  There are no tearful episodes.  Medications: Patient's Medications  New Prescriptions   No medications on file  Previous Medications   ALENDRONATE (FOSAMAX) 70 MG TABLET    TAKE 1 TAB ONCE A WK AT LEAST 30 MIN BEFORE 1ST FOOD.DO NOT LIE DOWN FOR 30 MIN AFTER TAKING.   ATORVASTATIN (LIPITOR) 40 MG TABLET    Take 40 mg by mouth daily. To control cholesterol   FEEDING SUPPLEMENT, GLUCERNA SHAKE, (GLUCERNA SHAKE) LIQD    Take 237 mLs by mouth 2 (two) times daily between meals.   FENOFIBRATE 160 MG TABLET    TAKE 1 TABLET ONCE A DAY TO CONTROL CHOLESTEROL.   FUROSEMIDE (LASIX) 20 MG TABLET    Take 1 tablet (20 mg total) by mouth every other day.   MEMANTINE HCL ER 14 MG CP24    Take 28 mg by mouth at bedtime.   METFORMIN (GLUCOPHAGE) 500 MG TABLET    One each morning to control glucose   NITROGLYCERIN (  NITROSTAT) 0.4 MG SL TABLET    Place 0.4 mg under the tongue every 5 (five) minutes as needed for chest pain.   RIVAROXABAN (XARELTO) 15 MG TABS TABLET    Take 1 tablet (15 mg total) by mouth daily with supper.   SOLIFENACIN (VESICARE) 10 MG TABLET    Take 10 mg by mouth daily. To decrease urinary urgency   TAMSULOSIN (FLOMAX) 0.4 MG CAPS CAPSULE    Take 0.4 mg by mouth daily after breakfast. For bladder  Modified Medications   No medications on file  Discontinued Medications   No medications on file     Review of Systems  Constitutional: Negative for fever, chills, activity change, appetite change,  fatigue and unexpected weight change.  HENT: Positive for hearing loss. Negative for ear pain and sinus pressure.   Eyes: Negative for photophobia, pain and visual disturbance.       Corrective lenses  Respiratory: Positive for shortness of breath. Negative for cough, chest tightness and wheezing.   Cardiovascular: Positive for leg swelling.       Pacemaker. History of syncope related to cardiac arrhythmia.  Gastrointestinal: Negative for abdominal pain and abdominal distention.  Endocrine: Negative for cold intolerance, heat intolerance, polydipsia, polyphagia and polyuria.       History of diabetes.  Genitourinary: Positive for urgency and frequency. Negative for dysuria and flank pain.       Urge incontinence. Increased frequency which has not responded well to [place]. Continues to see urologist.  Musculoskeletal: Positive for back pain, arthralgias and gait problem.       Poor balance and gait disturbance.  Skin:       Rough area on left cheek  Allergic/Immunologic: Negative.   Neurological: Negative for tremors, seizures, facial asymmetry and weakness.       Memory loss  Hematological: Negative.   Psychiatric/Behavioral: Positive for confusion.       Mild to moderate memory disturbance.    Filed Vitals:   11/09/14 0948  BP: 138/64  Pulse: 68  Weight: 179 lb (81.194 kg)  SpO2: 99%   Body mass index is 26.06 kg/(m^2).  Physical Exam  Constitutional: He is oriented to person, place, and time.  Frail  HENT:  Head: Normocephalic and atraumatic.  Nose: Nose normal.  Bilateral hearing aids.  Eyes: Conjunctivae are normal. Pupils are equal, round, and reactive to light.  Prescription lenses.  Neck: Normal range of motion. Neck supple. No JVD present. No tracheal deviation present. No thyromegaly present.  Cardiovascular: Normal rate and regular rhythm.  Exam reveals no gallop and no friction rub.   Murmur heard.  Systolic murmur is present with a grade of 1/6  Absent DP and  PT  Pulmonary/Chest: Effort normal and breath sounds normal. No respiratory distress. He has no wheezes. He has no rales. He exhibits no tenderness.  Using portable oxygen at 2 L/m today.  Abdominal: Soft. Bowel sounds are normal. He exhibits no distension and no mass. There is no tenderness.  Musculoskeletal: Normal range of motion. He exhibits edema. He exhibits no tenderness.  Pain in the left SI joint area. Limps when walking. Favors the left side  Lymphadenopathy:    He has no cervical adenopathy.  Neurological: He is alert and oriented to person, place, and time. He has normal strength. A sensory deficit is present. No cranial nerve deficit. Gait abnormal.  07/13/14 MMSE 28/30. Passed clock drawing. Diminshed sensation to vabration and monofilament.  Skin: Skin is warm and dry. No  erythema. No pallor.  Rough area on the left cheek  Psychiatric: He has a normal mood and affect. His behavior is normal. Thought content normal.  Patient is in denial regarding the extent of his memory deficit.     Labs reviewed: Nursing Home on 11/09/2014  Component Date Value Ref Range Status  . Glucose 11/01/2014 123   Final  . BUN 11/01/2014 38* 4 - 21 mg/dL Final  . Creatinine 11/01/2014 1.2  0.6 - 1.3 mg/dL Final  . Potassium 11/01/2014 4.6  3.4 - 5.3 mmol/L Final  . Sodium 11/01/2014 140  137 - 147 mmol/L Final  . LDl/HDL Ratio 11/01/2014 3.5   Final  . Triglycerides 11/01/2014 171* 40 - 160 mg/dL Final  . Cholesterol 11/01/2014 115  0 - 200 mg/dL Final  . HDL 11/01/2014 33* 35 - 70 mg/dL Final  . LDL Cholesterol 11/01/2014 48   Final  . Hgb A1c MFr Bld 11/01/2014 7.0* 4.0 - 6.0 % Final  Orders Only on 10/25/2014  Component Date Value Ref Range Status  . Sodium 10/25/2014 139  135 - 145 mEq/L Final  . Potassium 10/25/2014 4.5  3.5 - 5.3 mEq/L Final  . Chloride 10/25/2014 102  96 - 112 mEq/L Final  . CO2 10/25/2014 26  19 - 32 mEq/L Final  . Glucose, Bld 10/25/2014 139* 70 - 99 mg/dL Final    . BUN 10/25/2014 33* 6 - 23 mg/dL Final  . Creat 10/25/2014 1.37* 0.50 - 1.35 mg/dL Final  . Calcium 10/25/2014 9.2  8.4 - 10.5 mg/dL Final  . GFR, Est African American 10/25/2014 52*  Final  . GFR, Est Non African American 10/25/2014 45*  Final   Comment:   The estimated GFR is a calculation valid for adults (>=33 years old) that uses the CKD-EPI algorithm to adjust for age and sex. It is   not to be used for children, pregnant women, hospitalized patients,    patients on dialysis, or with rapidly changing kidney function. According to the NKDEP, eGFR >89 is normal, 60-89 shows mild impairment, 30-59 shows moderate impairment, 15-29 shows severe impairment and <15 is ESRD.     . Brain Natriuretic Peptide 10/25/2014 152.5* 0.0 - 100.0 pg/mL Final  Office Visit on 10/15/2014  Component Date Value Ref Range Status  . Date Time Interrogation Session 10/15/2014 [id number]   Final  . Pulse Generator Manufacturer 10/15/2014 [employer]   Final  . Pulse Gen Model 10/15/2014 [address] RF   Final  . Pulse Gen Serial Number 10/15/2014 [id number]   Final  . RV Sense Sensitivity 10/15/2014 2.0   Final  . RA Pace Amplitude 10/15/2014 2.0   Final  . RV Pace PulseWidth 10/15/2014 0.4   Final  . RV Pace Amplitude 10/15/2014 2.5   Final  . RA Impedance 10/15/2014 512.5   Final  . RA Amplitude 10/15/2014 4.4   Final  . RA Pacing Amplitude 10/15/2014 0.75   Final  . RA Pacing PulseWidth 10/15/2014 0.4   Final  . RA Pacing Amplitude 10/15/2014 0.75   Final  . RA Pacing PulseWidth 10/15/2014 0.4   Final  . RV IMPEDANCE 10/15/2014 612.5   Final  . RV Amplitude 10/15/2014 11.3   Final  . RV Pacing Amplitude 10/15/2014 0.75   Final  . RV Pacing PulseWidth 10/15/2014 0.4   Final  . RV Pacing Amplitude 10/15/2014 0.75   Final  . RV Pacing PulseWidth 10/15/2014 0.4   Final  . Battery  Status 10/15/2014 Unknown   Final  . Battery Longevity 10/15/2014 134.4   Final  . Battery Voltage  10/15/2014 2.96   Final  . Loletha Grayer RA Perc Paced 10/15/2014 2.9   Final  . Loletha Grayer RV Perc Paced 10/15/2014 22.0   Final  . Eval Rhythm 10/15/2014 SR@60    Final  . Miscellaneous Comment 10/15/2014    Final                   Value:Pacemaker check in clinic. Normal device function. Thresholds, sensing, impedances consistent with previous measurements. Device programmed to maximize longevity.18 mode switch, + coumadin.  Total burden <1%.  2 high ventricular rates noted, A-fib with                          RVR. Device programmed at appropriate safety margins. Histogram distribution appropriate for patient activity level. Device programmed to optimize intrinsic conduction. Estimated longevity 10.5 years. Patient enrolled in remote follow-up/TTM's with                          [employer]. Plan to follow every 3 months remotely and see annually in office. Patient education completed.  [place] 01/17/15.  Checked by [employer].  Admission on 10/08/2014, Discharged on 10/10/2014  Component Date Value Ref Range Status  . WBC 10/08/2014 5.2  4.0 - 10.5 K/uL Final  . RBC 10/08/2014 4.92  4.22 - 5.81 MIL/uL Final  . Hemoglobin 10/08/2014 14.7  13.0 - 17.0 g/dL Final  . HCT 10/08/2014 44.1  39.0 - 52.0 % Final  . MCV 10/08/2014 89.6  78.0 - 100.0 fL Final  . MCH 10/08/2014 29.9  26.0 - 34.0 pg Final  . MCHC 10/08/2014 33.3  30.0 - 36.0 g/dL Final  . RDW 10/08/2014 14.1  11.5 - 15.5 % Final  . Platelets 10/08/2014 172  150 - 400 K/uL Final  . Sodium 10/08/2014 139  137 - 147 mEq/L Final  . Potassium 10/08/2014 4.1  3.7 - 5.3 mEq/L Final  . Chloride 10/08/2014 102  96 - 112 mEq/L Final  . CO2 10/08/2014 21  19 - 32 mEq/L Final  . Glucose, Bld 10/08/2014 157* 70 - 99 mg/dL Final  . BUN 10/08/2014 39* 6 - 23 mg/dL Final  . Creatinine, Ser 10/08/2014 1.48* 0.50 - 1.35 mg/dL Final  . Calcium 10/08/2014 9.5  8.4 - 10.5 mg/dL Final  . Total Protein 10/08/2014 7.3  6.0 - 8.3 g/dL Final  . Albumin 10/08/2014 3.6  3.5 - 5.2  g/dL Final  . AST 10/08/2014 25  0 - 37 U/L Final  . ALT 10/08/2014 11  0 - 53 U/L Final  . Alkaline Phosphatase 10/08/2014 44  39 - 117 U/L Final  . Total Bilirubin 10/08/2014 0.9  0.3 - 1.2 mg/dL Final  . GFR calc non Af Amer 10/08/2014 40* >90 mL/min Final  . GFR calc Af Amer 10/08/2014 47* >90 mL/min Final   Comment: (NOTE)                          The eGFR has been calculated using the CKD EPI equation.                          This calculation has not been validated in all clinical situations.  eGFR's persistently <90 mL/min signify possible Chronic Kidney                          Disease.  . Anion gap 10/08/2014 16* 5 - 15 Final  . Troponin i, poc 10/08/2014 0.10* 0.00 - 0.08 ng/mL Final  . Comment 10/08/2014 NOTIFIED PHYSICIAN   Final  . Comment 3 10/08/2014          Final   Comment: Due to the release kinetics of cTnI,                          a negative result within the first hours                          of the onset of symptoms does not rule out                          myocardial infarction with certainty.                          If myocardial infarction is still suspected,                          repeat the test at appropriate intervals.  . Pro B Natriuretic peptide (BNP) 10/08/2014 678.3* 0 - 450 pg/mL Final  . TSH 10/08/2014 3.830  0.350 - 4.500 uIU/mL Final  . Troponin I 10/08/2014 <0.30  <0.30 ng/mL Final   Comment:                                 Due to the release kinetics of cTnI,                          a negative result within the first hours                          of the onset of symptoms does not rule out                          myocardial infarction with certainty.                          If myocardial infarction is still suspected,                          repeat the test at appropriate intervals.  . Troponin I 10/08/2014 <0.30  <0.30 ng/mL Final   Comment:                                 Due to the release kinetics of  cTnI,                          a negative result within the first hours                          of the onset of symptoms does not rule out  myocardial infarction with certainty.                          If myocardial infarction is still suspected,                          repeat the test at appropriate intervals.  . Troponin I 10/09/2014 <0.30  <0.30 ng/mL Final   Comment:                                 Due to the release kinetics of cTnI,                          a negative result within the first hours                          of the onset of symptoms does not rule out                          myocardial infarction with certainty.                          If myocardial infarction is still suspected,                          repeat the test at appropriate intervals.  . Magnesium 10/08/2014 2.0  1.5 - 2.5 mg/dL Final  . WBC 10/08/2014 5.4  4.0 - 10.5 K/uL Final  . RBC 10/08/2014 4.88  4.22 - 5.81 MIL/uL Final  . Hemoglobin 10/08/2014 14.9  13.0 - 17.0 g/dL Final  . HCT 10/08/2014 43.6  39.0 - 52.0 % Final  . MCV 10/08/2014 89.3  78.0 - 100.0 fL Final  . MCH 10/08/2014 30.5  26.0 - 34.0 pg Final  . MCHC 10/08/2014 34.2  30.0 - 36.0 g/dL Final  . RDW 10/08/2014 14.0  11.5 - 15.5 % Final  . Platelets 10/08/2014 163  150 - 400 K/uL Final  . Creatinine, Ser 10/08/2014 1.55* 0.50 - 1.35 mg/dL Final  . GFR calc non Af Amer 10/08/2014 38* >90 mL/min Final  . GFR calc Af Amer 10/08/2014 44* >90 mL/min Final   Comment: (NOTE)                          The eGFR has been calculated using the CKD EPI equation.                          This calculation has not been validated in all clinical situations.                          eGFR's persistently <90 mL/min signify possible Chronic Kidney                          Disease.  . Glucose-Capillary 10/08/2014 114* 70 - 99 mg/dL Final  . Comment 1 10/08/2014 Notify RN   Final  . Sodium 10/09/2014 138  137 - 147 mEq/L Final  .  Potassium 10/09/2014 4.2  3.7 - 5.3 mEq/L Final  . Chloride 10/09/2014 100  96 - 112 mEq/L Final  . CO2 10/09/2014 24  19 - 32 mEq/L Final  . Glucose, Bld 10/09/2014 139* 70 - 99 mg/dL Final  . BUN 83/72/9021 43* 6 - 23 mg/dL Final  . Creatinine, Ser 10/09/2014 1.63* 0.50 - 1.35 mg/dL Final  . Calcium 11/55/2080 9.5  8.4 - 10.5 mg/dL Final  . GFR calc non Af Amer 10/09/2014 36* >90 mL/min Final  . GFR calc Af Amer 10/09/2014 41* >90 mL/min Final   Comment: (NOTE)                          The eGFR has been calculated using the CKD EPI equation.                          This calculation has not been validated in all clinical situations.                          eGFR's persistently <90 mL/min signify possible Chronic Kidney                          Disease.  . Anion gap 10/09/2014 14  5 - 15 Final  . Glucose-Capillary 10/08/2014 155* 70 - 99 mg/dL Final  . Glucose-Capillary 10/09/2014 114* 70 - 99 mg/dL Final  . Glucose-Capillary 10/09/2014 175* 70 - 99 mg/dL Final  . Glucose-Capillary 10/09/2014 132* 70 - 99 mg/dL Final  . Sodium 22/33/6122 135* 137 - 147 mEq/L Final  . Potassium 10/10/2014 3.9  3.7 - 5.3 mEq/L Final  . Chloride 10/10/2014 96  96 - 112 mEq/L Final  . CO2 10/10/2014 24  19 - 32 mEq/L Final  . Glucose, Bld 10/10/2014 139* 70 - 99 mg/dL Final  . BUN 44/97/5300 42* 6 - 23 mg/dL Final  . Creatinine, Ser 10/10/2014 1.61* 0.50 - 1.35 mg/dL Final  . Calcium 51/09/2110 9.4  8.4 - 10.5 mg/dL Final  . GFR calc non Af Amer 10/10/2014 36* >90 mL/min Final  . GFR calc Af Amer 10/10/2014 42* >90 mL/min Final   Comment: (NOTE)                          The eGFR has been calculated using the CKD EPI equation.                          This calculation has not been validated in all clinical situations.                          eGFR's persistently <90 mL/min signify possible Chronic Kidney                          Disease.  . Anion gap 10/10/2014 15  5 - 15 Final  . Glucose-Capillary  10/09/2014 129* 70 - 99 mg/dL Final  . Comment 1 73/56/7014 Notify RN   Final  . Comment 2 10/09/2014 Documented in Chart   Final  . Glucose-Capillary 10/10/2014 133* 70 - 99 mg/dL Final  . Comment 1 10/22/1313 Notify RN   Final  . Comment 2 10/10/2014 Documented in Chart   Final  . Glucose-Capillary 10/10/2014 134* 70 - 99 mg/dL Final     Assessment/Plan  1. Dyspnea  Patient goal is to be able to get off of oxygen - Ambulatory referral to Pulmonology  2. DM type 2 with diabetic peripheral neuropathy controlled  3. Coronary artery disease due to lipid rich plaque No angina  4. Depression mild  5. Memory loss progressing  6. Essential hypertension controlled  7. Ischemic cardiomyopathy Worse when LVEF is compared to prior 2 D Echo result  8. Hearing loss, bilateral Interferes with communication and tends to isolate him.  9. Chronic combined systolic and diastolic CHF (congestive heart failure) Compensated   Lab prior to next visit: BNP, CMP, A1c, lipids, urine microalbumin Obtain MMSE at next visit.

## 2014-11-10 ENCOUNTER — Ambulatory Visit (INDEPENDENT_AMBULATORY_CARE_PROVIDER_SITE_OTHER): Payer: Medicare Other | Admitting: Pulmonary Disease

## 2014-11-10 ENCOUNTER — Encounter: Payer: Self-pay | Admitting: Pulmonary Disease

## 2014-11-10 VITALS — BP 98/58 | HR 86 | Ht 66.0 in | Wt 179.4 lb

## 2014-11-10 DIAGNOSIS — R0600 Dyspnea, unspecified: Secondary | ICD-10-CM

## 2014-11-10 DIAGNOSIS — R0602 Shortness of breath: Secondary | ICD-10-CM

## 2014-11-10 NOTE — Progress Notes (Signed)
Subjective:    Patient ID: Zachary Harris, male    DOB: 1923/12/22, 80 y.o.   MRN: [id number]  HPI 80 year old ex-smoker presents for evaluation of dyspnea. Accompanied by his daughter provides most of the history. He has moderate dementia. He reports class 2-3 NYHA symptoms He smoked about 40 pack years until he quit in 2005 Hospitalized 10/08/2014 through 10/10/2014, was discharged on oxygen In the hospital dyspneic. Daughter says he could walk no more than 5 or 6 steps before he stopped with dyspnea. Whilel hospitalized he was diuresed about 2 L of fluid.  Echo - EF 45%, grade 1 diast dysfunction Spirometry surprisingly showed preserved lung function with a ratio 79, FEV1 of 90%-2.31 and FVC of 83% 2.92. He has history of DVT 02/2011, and then developed atrial fibrillation and has been maintained and Xarelto He underwent CABG in 97 and stent placement in 2007. He is lost 15 pounds over the last 2 months. He is currently participating in home physical therapy. He lives by himself, his daughter who lives in [place] has been traveling back and forth to provide care\ He denies wheezing or frequent chest colds His oxygen saturation was 93% off oxygen at rest and he appeared to weak to ambulate today  Past Medical History  Diagnosis Date  . ANEMIA-IRON DEFICIENCY 05/27/2007  . ANXIETY 11/14/2007  . Intermittent high-grade heart block 09/05/2009    s/p pacemaker  . BENIGN PROSTATIC HYPERTROPHY 11/14/2007  . CAROTID ARTERY DISEASE 10/02/2010  . Pacemaker-St. Jude 02/02/2008    [employer]. Jude  . COLONIC POLYPS, HX OF 05/27/2007  . CORONARY ARTERY DISEASE 11/14/2007    prior bypass [phone]; Inferior MI 11/07; Severe 3 vessel disease; occluded SVG to RCA; SVG to OM subtotalled, SVG to diagonal with ostial 70 and 80 mid; EF 45. PCI of SVG to OM. FU myovue showed EF 56 with inferior infarct and very mild peri-infarct ischemia  . DEPRESSION 11/14/2007  . DIABETES MELLITUS, TYPE II 11/14/2007  .  DIVERTICULITIS, HX OF 05/27/2007  . DVT (deep venous thrombosis) 03/21/2011  . FOOT PAIN, BILATERAL 12/08/2010  . GERD 11/14/2007  . HYPERLIPIDEMIA 11/14/2007  . HYPERTENSION 11/14/2007  . HYPOTENSION, ORTHOSTATIC 03/06/2011  . Memory loss 06/01/2008  . OSTEOPOROSIS 06/02/2010  . PERIPHERAL VASCULAR DISEASE 11/14/2007  . RENAL INSUFFICIENCY 11/14/2007  . SECONDARY DM W/PERIPHERAL CIRC D/O UNCONTROLLED 11/07/2010  . SYNCOPE 09/08/2008  . Unspecified hearing loss 05/13/2009  . Prostate cancer   . Myalgia and myositis, unspecified 04/24/2011  . Other malaise and fatigue 04/24/2011  . Peripheral neuropathy 03/27/2011  . Seborrheic keratosis 06/05/2011  . Osteoarthrosis, unspecified whether generalized or localized, unspecified site   . Ischemic cardiomyopathy     a. echo (10/15):  mod focal basal septal hypertrophy, EF 40-45%, inf AK, Gr 1 DD, Al sclerosis, mild AI, MAC, mild MR, mod LAE, lipomatous hypertrophy, small effusion (no hemodynamic compromise)     Past Surgical History  Procedure Laterality Date  . S/p coronary stent x 1    . Cholecystectomy    . S/p bilat knee replacement  Cleon Gustin, MD  . Pacemaker insertion  2012    Caryl Comes, MD  . Prostate needle biopsy    . Cystoscopy    . Appendectomy    . Coronary artery bypass graft  2007     Review of Systems Constitutional: negative for anorexia, fevers and sweats  Eyes: negative for irritation, redness and visual disturbance  Ears, nose, mouth, throat, and face: negative  for earaches, epistaxis, nasal congestion and sore throat   Cardiovascular: negative for chest pain, dyspnea, lower extremity edema, orthopnea, palpitations and syncope  Gastrointestinal: negative for abdominal pain, constipation, diarrhea, melena, nausea and vomiting  Genitourinary:negative for dysuria, frequency and hematuria  Hematologic/lymphatic: negative for bleeding, easy bruising and lymphadenopathy  Musculoskeletal:negative for arthralgias, muscle weakness  and stiff joints  Neurological: negative for coordination problems, gait problems, headaches and weakness  Endocrine: negative for diabetic symptoms including polydipsia, polyuria and weight loss     Objective:   Physical Exam  Gen. Pleasant, thin, in no distress, normal affect ENT - no lesions, no post nasal drip Neck: No JVD, no thyromegaly, no carotid bruits Lungs: no use of accessory muscles, no dullness to percussion, bibasal rales, no rhonchi  Cardiovascular: Rhythm regular, heart sounds  normal, no murmurs or gallops, no peripheral edema Abdomen: soft and non-tender, no hepatosplenomegaly, BS normal. Musculoskeletal: No deformities, no cyanosis or clubbing Neuro:  alert, non focal        Assessment & Plan:

## 2014-11-10 NOTE — Patient Instructions (Addendum)
Lung function is Ok -does not need breathing medications OK to stay off oxygen at rest, continue to use during walking or exercise and sleep Recheck oxygen levels in 30months

## 2014-11-15 NOTE — Assessment & Plan Note (Signed)
Lung function is Ok -does not need breathing medications OK to stay off oxygen at rest, continue to use during walking or exercise and sleep Recheck oxygen levels in 57months

## 2014-11-17 ENCOUNTER — Encounter: Payer: Self-pay | Admitting: Cardiology

## 2014-11-17 ENCOUNTER — Ambulatory Visit (INDEPENDENT_AMBULATORY_CARE_PROVIDER_SITE_OTHER): Payer: Medicare Other | Admitting: Nurse Practitioner

## 2014-11-17 ENCOUNTER — Encounter: Payer: Self-pay | Admitting: Nurse Practitioner

## 2014-11-17 VITALS — BP 116/66 | HR 84 | Temp 95.8°F | Resp 20 | Wt 179.8 lb

## 2014-11-17 DIAGNOSIS — R413 Other amnesia: Secondary | ICD-10-CM

## 2014-11-17 MED ORDER — DONEPEZIL HCL 5 MG PO TABS: ORAL_TABLET | ORAL | Status: DC

## 2014-11-17 NOTE — Progress Notes (Signed)
[facility]  PATIENT: RENDON HOWELL DOB: 31-Jan-1923   REASON FOR VISIT: Follow-up for memory loss  HISTORY OF PRESENT ILLNESS: PARMINDER CUPPLES is a 80 years old gentleman , accompanied by her daughter [name], referred by his care physician Dr. Raliegh Scarlet for evaluation of memory trouble. 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 Jafri, 80 year old returns for follow-up with his daughter who lives in [place]. She has multiple questions about his CT scan as she has not been able to talk to Dr. Dr. Krista Blue. He has recently been placed on oxygen at 2 L a minute continuously. He has sitters that come and stay with him. He was admitted to the ER in October for congestive heart failure. He has had some physical therapy since last seen by Dr. Krista Blue 07/30/2014 which is beneficial according to the daughter. He was placed on Namenda at his last visit and denies side effects, currently on 28 mg extended release. CT head showed 1. Moderate frontal, parietal, temporal atrophy.  Mild ventriculomegaly on ex vacuo basis. 2. Mild chronic small vessel ischemic disease.  3. Right parietal convexity encephalomalacia, may represent chronic ischemic infarction. 4. No acute findings.  Lab showed normal B12, TSH, mild elevated creat 1.3. He returns for reevaluation.   REVIEW OF SYSTEMS: Full 14 system review of systems performed and notable only for those listed, all others are neg:  Constitutional: N/A  Cardiovascular: N/A  Ear/Nose/Throat: N/A  Skin: N/A  Eyes: N/A  Respiratory: Shortness of breath Gastroitestinal: Frequency of urination Hematology/Lymphatic: N/A  Endocrine: N/A Musculoskeletal:N/A  Allergy/Immunology: N/A  Neurological: Memory loss Psychiatric: Decreased concentration Sleep : NA   ALLERGIES: No Known Allergies  HOME MEDICATIONS: Outpatient Prescriptions Prior to Visit  Medication Sig Dispense Refill  . alendronate (FOSAMAX) 70 MG tablet TAKE 1 TAB ONCE A WK AT LEAST 30 MIN BEFORE 1ST FOOD.DO NOT LIE DOWN FOR 30 MIN AFTER TAKING. 12 tablet 3  . atorvastatin (LIPITOR) 40 MG tablet Take 40 mg by mouth daily. To control cholesterol    . feeding supplement, GLUCERNA SHAKE, (GLUCERNA SHAKE) LIQD Take 237 mLs by mouth 2 (two) times daily between meals. (Patient taking differently: Take 237 mLs by mouth as needed. )  0  . fenofibrate 160 MG tablet TAKE 1 TABLET ONCE A DAY TO CONTROL CHOLESTEROL. 30 tablet 2  . furosemide (LASIX) 20 MG tablet Take 1 tablet (20 mg total) by mouth every other day. 30 tablet   . Memantine HCl ER 14 MG CP24 Take 28 mg by mouth at bedtime.    Marland Kitchen  metFORMIN (GLUCOPHAGE) 500 MG tablet One each morning to control glucose 90 tablet 3  . nitroGLYCERIN (NITROSTAT) 0.4 MG SL tablet Place 0.4 mg under the tongue every 5 (five) minutes as needed for chest pain.    . Rivaroxaban (XARELTO) 15 MG TABS tablet Take 1 tablet (15 mg total) by mouth daily with supper. 30 tablet 6  . solifenacin (VESICARE) 10 MG tablet Take 10 mg by mouth  daily. To decrease urinary urgency    . tamsulosin (FLOMAX) 0.4 MG CAPS capsule Take 0.4 mg by mouth daily after breakfast. For bladder     No facility-administered medications prior to visit.    PAST MEDICAL HISTORY: Past Medical History  Diagnosis Date  . ANEMIA-IRON DEFICIENCY 05/27/2007  . ANXIETY 11/14/2007  . Intermittent high-grade heart block 09/05/2009    s/p pacemaker  . BENIGN PROSTATIC HYPERTROPHY 11/14/2007  . CAROTID ARTERY DISEASE 10/02/2010  . Pacemaker-St. Jude 02/02/2008    St. Jude  . COLONIC POLYPS, HX OF 05/27/2007  . CORONARY ARTERY DISEASE 11/14/2007    prior bypass [phone]; Inferior MI 11/07; Severe 3 vessel disease; occluded SVG to RCA; SVG to OM subtotalled, SVG to diagonal with ostial 70 and 80 mid; EF 45. PCI of SVG to OM. FU myovue showed EF 56 with inferior infarct and very mild peri-infarct ischemia  . DEPRESSION 11/14/2007  . DIABETES MELLITUS, TYPE II 11/14/2007  . DIVERTICULITIS, HX OF 05/27/2007  . DVT (deep venous thrombosis) 03/21/2011  . FOOT PAIN, BILATERAL 12/08/2010  . GERD 11/14/2007  . HYPERLIPIDEMIA 11/14/2007  . HYPERTENSION 11/14/2007  . HYPOTENSION, ORTHOSTATIC 03/06/2011  . Memory loss 06/01/2008  . OSTEOPOROSIS 06/02/2010  . PERIPHERAL VASCULAR DISEASE 11/14/2007  . RENAL INSUFFICIENCY 11/14/2007  . SECONDARY DM W/PERIPHERAL CIRC D/O UNCONTROLLED 11/07/2010  . SYNCOPE 09/08/2008  . Unspecified hearing loss 05/13/2009  . Prostate cancer   . Myalgia and myositis, unspecified 04/24/2011  . Other malaise and fatigue 04/24/2011  . Peripheral neuropathy 03/27/2011  . Seborrheic keratosis 06/05/2011  . Osteoarthrosis, unspecified whether generalized or localized, unspecified site   . Ischemic cardiomyopathy     a. echo (10/15):  mod focal basal septal hypertrophy, EF 40-45%, inf AK, Gr 1 DD, Al sclerosis, mild AI, MAC, mild MR, mod LAE, lipomatous hypertrophy, small effusion (no hemodynamic compromise)     PAST SURGICAL HISTORY: Past Surgical  History  Procedure Laterality Date  . S/p coronary stent x 1    . Cholecystectomy    . S/p bilat knee replacement  Cleon Gustin, MD  . Pacemaker insertion  2012    Caryl Comes, MD  . Prostate needle biopsy    . Cystoscopy    . Appendectomy    . Coronary artery bypass graft  2007    FAMILY HISTORY: Family History  Problem Relation Age of Onset  . Diabetes Father     53  . Cancer Mother     SOCIAL HISTORY: History   Social History  . Marital Status: Widowed    Spouse Name: N/A    Number of Children: 2  . Years of Education: College   Occupational History  . retired operated [id number]  . Smoking status: Former Smoker    Quit date: 10/18/1983  . Smokeless tobacco: Never Used  . Alcohol Use: No  . Drug Use: No  . Sexual Activity: No   Other Topics Concern  . Not on file   Social History Narrative  Patient is widowed with 2 children.   Patient has a college education.   Patient is right handed.   Patient drinks 2 cups daily.   Walking with wallker     PHYSICAL EXAM  Filed Vitals:   11/17/14 1341  BP: 116/66  Pulse: 84  Temp: 95.8 F (35.4 C)  TempSrc: Oral  Resp: 20  Weight: 179 lb 12.8 oz (81.557 kg)   Body mass index is 29.03 kg/(m^2). Generalized: In no acute distress O2 at 2l/min Neck: Supple, no carotid bruits  Musculoskeletal: No deformity  Neurological examination  Mentation: Alert oriented to time, place, history taking, and causual conversation, Mini-Mental Status Examination is 27 out of 30, 07/30/14. Not repeated today Cranial nerve II-XII: Pupils were equal round reactive to light. Extraocular movements were full. Visual field were full on confrontational test.  Facial sensation and strength were normal. Hearing was intact to finger rubbing bilaterally. Uvula tongue midline. Head turning and shoulder shrug and were normal and symmetric.Tongue protrusion into cheek strength was normal. Motor: Normal  tone, bulk and strength. Sensory: Intact to fine touch, pinprick, preserved vibratory sensation, and proprioception at toes. Coordination: Normal finger to nose, heel-to-shin bilaterally there was no truncal ataxia Gait: cautious but steady gait, ambulates with rolling walker, no difficulty with turns Romberg signs: Negative Deep tendon reflexes: Brachioradialis 2/2, biceps 2/2, triceps 2/2, patellar 2/2, Achilles 2/2, plantar responses were flexor bilaterally.   DIAGNOSTIC DATA (LABS, IMAGING, TESTING) - I reviewed patient records, labs, notes, testing and imaging myself where available.  Lab Results  Component Value Date   WBC 5.4 10/08/2014   HGB 14.9 10/08/2014   HCT 43.6 10/08/2014   MCV 89.3 10/08/2014   PLT 163 10/08/2014      Component Value Date/Time   NA 140 11/01/2014   NA 139 10/25/2014 1107   K 4.6 11/01/2014   CL 102 10/25/2014 1107   CO2 26 10/25/2014 1107   GLUCOSE 139* 10/25/2014 1107   GLUCOSE 130* 07/30/2014 1434   GLUCOSE 121* 01/02/2007 0858   BUN 38* 11/01/2014   BUN 33* 10/25/2014 1107   CREATININE 1.2 11/01/2014   CREATININE 1.37* 10/25/2014 1107   CREATININE 1.61* 10/10/2014 0355   CALCIUM 9.2 10/25/2014 1107   CALCIUM 9.5 06/02/2010 2048   PROT 7.3 10/08/2014 1021   PROT 7.0 07/30/2014 1434   ALBUMIN 3.6 10/08/2014 1021   AST 25 10/08/2014 1021   ALT 11 10/08/2014 1021   ALKPHOS 44 10/08/2014 1021   BILITOT 0.9 10/08/2014 1021   GFRNONAA 45* 10/25/2014 1107   GFRNONAA 36* 10/10/2014 0355   GFRAA 52* 10/25/2014 1107   GFRAA 42* 10/10/2014 0355   Lab Results  Component Value Date   CHOL 115 11/01/2014   HDL 33* 11/01/2014   LDLCALC 48 11/01/2014   LDLDIRECT 85.6 07/19/2010   TRIG 171* 11/01/2014   CHOLHDL 4 03/06/2011   Lab Results  Component Value Date   HGBA1C 7.0* 11/01/2014   Lab Results  Component Value Date   VITAMINB12 307 07/30/2014   Lab Results  Component Value Date   TSH 3.830 10/08/2014      ASSESSMENT AND  PLAN  80 y.o. year old male  has a past medical history of gradual onset of bowel memory troubles, differential diagnosis includes normal aging versus mild cognitive impairment.CT head showed 1. Moderate frontal, parietal, temporal atrophy. Mild ventriculomegaly on ex vacuo basis. 2. Mild chronic small vessel ischemic disease.  3. Right parietal convexity encephalomalacia, may represent chronic ischemic infarction.  4. No acute findings.  Lab showed normal B12, TSH, mild elevated creat 1.3.  Continue Namenda at 28mg  daily Begin Aricept 5 mg po daily for 1 month then increase to 2 tabs. Dr. Krista Blue came in and spent 25 minutes face-to-face discussing CT results as well as his diagnosis of mild memory loss with patient and daughter. Multiple questions answered.  F/U 3 months Vst time 1 hour Dennie Bible, [facility], [facility], APRN  [facility] [address], [place], [place] [address] [phone]

## 2014-11-17 NOTE — Patient Instructions (Signed)
Continue Namenda at 28mg  daily Begin Aricept 5 mg po daily for 1 month then increase to 2 tabs.  F/U 3 months

## 2014-11-23 ENCOUNTER — Encounter: Payer: Self-pay | Admitting: Internal Medicine

## 2014-11-23 NOTE — Progress Notes (Signed)
I agree above plan. 

## 2014-11-30 ENCOUNTER — Encounter: Payer: Self-pay | Admitting: Internal Medicine

## 2014-12-10 LAB — CBC
HEMATOCRIT: 39.1 % (ref 39.0–52.0)
Hemoglobin: 13 g/dL (ref 13.0–17.0)
MCH: 29.9 pg (ref 26.0–34.0)
MCHC: 33.2 g/dL (ref 30.0–36.0)
MCV: 89.9 fL (ref 78.0–100.0)
Platelets: 152 10*3/uL (ref 150–400)
RBC: 4.35 MIL/uL (ref 4.22–5.81)
RDW: 14.5 % (ref 11.5–15.5)
WBC: 4.8 10*3/uL (ref 4.0–10.5)

## 2014-12-10 LAB — BASIC METABOLIC PANEL WITH GFR
BUN: 34 mg/dL — AB (ref 6–23)
CALCIUM: 9.8 mg/dL (ref 8.4–10.5)
CHLORIDE: 101 meq/L (ref 96–112)
CO2: 27 meq/L (ref 19–32)
CREATININE: 1.21 mg/dL (ref 0.50–1.35)
GFR, Est African American: 61 mL/min
GFR, Est Non African American: 53 mL/min — ABNORMAL LOW
GLUCOSE: 129 mg/dL — AB (ref 70–99)
Potassium: 4.4 mEq/L (ref 3.5–5.3)
Sodium: 136 mEq/L (ref 135–145)

## 2014-12-29 ENCOUNTER — Other Ambulatory Visit: Payer: Self-pay | Admitting: Internal Medicine

## 2015-01-10 DIAGNOSIS — I5020 Unspecified systolic (congestive) heart failure: Secondary | ICD-10-CM | POA: Diagnosis not present

## 2015-01-13 ENCOUNTER — Ambulatory Visit (INDEPENDENT_AMBULATORY_CARE_PROVIDER_SITE_OTHER): Payer: Medicare Other | Admitting: Adult Health

## 2015-01-13 ENCOUNTER — Encounter: Payer: Self-pay | Admitting: Adult Health

## 2015-01-13 VITALS — BP 110/56 | HR 67 | Temp 97.4°F | Ht 69.0 in | Wt 187.6 lb

## 2015-01-13 DIAGNOSIS — I441 Atrioventricular block, second degree: Secondary | ICD-10-CM | POA: Diagnosis not present

## 2015-01-13 DIAGNOSIS — I5042 Chronic combined systolic (congestive) and diastolic (congestive) heart failure: Secondary | ICD-10-CM

## 2015-01-13 DIAGNOSIS — R0600 Dyspnea, unspecified: Secondary | ICD-10-CM

## 2015-01-13 DIAGNOSIS — J9611 Chronic respiratory failure with hypoxia: Secondary | ICD-10-CM

## 2015-01-13 DIAGNOSIS — J9610 Chronic respiratory failure, unspecified whether with hypoxia or hypercapnia: Secondary | ICD-10-CM | POA: Insufficient documentation

## 2015-01-13 NOTE — Assessment & Plan Note (Signed)
Doing well on O2  Cont on O2  

## 2015-01-13 NOTE — Assessment & Plan Note (Signed)
Recommend card follow up and notifiy of syncopal spell 2 months  Has pacemaker

## 2015-01-13 NOTE — Assessment & Plan Note (Signed)
Suspect is multifactoral with underlying heart issues with CHF  Cont on O2 .

## 2015-01-13 NOTE — Progress Notes (Signed)
Subjective:    Patient ID: Zachary Harris, male    DOB: 04-28-1923, 81 y.o.   MRN: [id number]  HPI 81 year old ex-smoker presents for evaluation of dyspnea. Accompanied by his daughter provides most of the history. He has moderate dementia. He reports class 2-3 NYHA symptoms He smoked about 40 pack years until he quit in 2005 Hospitalized 10/08/2014 through 10/10/2014, was discharged on oxygen In the hospital dyspneic. Daughter says he could walk no more than 5 or 6 steps before he stopped with dyspnea. Whilel hospitalized he was diuresed about 2 L of fluid.  Echo - EF 45%, grade 1 diast dysfunction Spirometry surprisingly showed preserved lung function with a ratio 79, FEV1 of 90%-2.31 and FVC of 83% 2.92. He has history of DVT 02/2011, and then developed atrial fibrillation and has been maintained and Xarelto He underwent CABG in 97 and stent placement in 2007. He is lost 15 pounds over the last 2 months. He is currently participating in home physical therapy. He lives by himself, his daughter who lives in [place] has been traveling back and forth to provide care\ He denies wheezing or frequent chest colds His oxygen saturation was 93% off oxygen at rest and he appeared to weak to ambulate today  01/13/2015 Follow up : Dyspnea  Pt returns for 2 month follow up .  Seen last ov for evaluation of dyspnea.  Spirometry shows no airflow obstruction.  Family has been checking O2 levels on RA does drop down 85-87%. . Feels better on O2.  Wears Oxygen 2l/m with act and At bedtime   Wants POC evaluation.  Overall says he is doing well. No flare of dyspnea.  Daughter says he had syncopal episode 2 months ago that last few seconds at Thanksgiving. Did not seek medical attention.  Has cardiology visit in couple weeks , has pacemaker w/ hx of heart block.  We discussed that she should call their office to notify of episode.  No further syncope episodes.  Remains of Xarelto.     Past Medical  History  Diagnosis Date  . ANEMIA-IRON DEFICIENCY 05/27/2007  . ANXIETY 11/14/2007  . Intermittent high-grade heart block 09/05/2009    s/p pacemaker  . BENIGN PROSTATIC HYPERTROPHY 11/14/2007  . CAROTID ARTERY DISEASE 10/02/2010  . Pacemaker-St. Jude 02/02/2008    St. Jude  . COLONIC POLYPS, HX OF 05/27/2007  . CORONARY ARTERY DISEASE 11/14/2007    prior bypass [id number]; Inferior MI 11/07; Severe 3 vessel disease; occluded SVG to RCA; SVG to OM subtotalled, SVG to diagonal with ostial 70 and 80 mid; EF 45. PCI of SVG to OM. FU myovue showed EF 56 with inferior infarct and very mild peri-infarct ischemia  . DEPRESSION 11/14/2007  . DIABETES MELLITUS, TYPE II 11/14/2007  . DIVERTICULITIS, HX OF 05/27/2007  . DVT (deep venous thrombosis) 03/21/2011  . FOOT PAIN, BILATERAL 12/08/2010  . GERD 11/14/2007  . HYPERLIPIDEMIA 11/14/2007  . HYPERTENSION 11/14/2007  . HYPOTENSION, ORTHOSTATIC 03/06/2011  . Memory loss 06/01/2008  . OSTEOPOROSIS 06/02/2010  . PERIPHERAL VASCULAR DISEASE 11/14/2007  . RENAL INSUFFICIENCY 11/14/2007  . SECONDARY DM W/PERIPHERAL CIRC D/O UNCONTROLLED 11/07/2010  . SYNCOPE 09/08/2008  . Unspecified hearing loss 05/13/2009  . Prostate cancer   . Myalgia and myositis, unspecified 04/24/2011  . Other malaise and fatigue 04/24/2011  . Peripheral neuropathy 03/27/2011  . Seborrheic keratosis 06/05/2011  . Osteoarthrosis, unspecified whether generalized or localized, unspecified site   . Ischemic cardiomyopathy     a. echo (10/15):  mod focal basal septal hypertrophy, EF 40-45%, inf AK, Gr 1 DD, Al sclerosis, mild AI, MAC, mild MR, mod LAE, lipomatous hypertrophy, small effusion (no hemodynamic compromise)     Past Surgical History  Procedure Laterality Date  . S/p coronary stent x 1    . Cholecystectomy    . S/p bilat knee replacement  Cleon Gustin, MD  . Pacemaker insertion  2012    Caryl Comes, MD  . Prostate needle biopsy    . Cystoscopy    . Appendectomy    . Coronary artery  bypass graft  2007     Review of Systems Constitutional: negative for anorexia, fevers and sweats  Eyes: negative for irritation, redness and visual disturbance  Ears, nose, mouth, throat, and face: negative for earaches, epistaxis, nasal congestion and sore throat   Cardiovascular: negative for chest pain, dyspnea, lower extremity edema, orthopnea, palpitations and syncope  Gastrointestinal: negative for abdominal pain, constipation, diarrhea, melena, nausea and vomiting  Genitourinary:negative for dysuria, frequency and hematuria  Hematologic/lymphatic: negative for bleeding, easy bruising and lymphadenopathy  Musculoskeletal:negative for arthralgias, muscle weakness and stiff joints  Neurological: negative for coordination problems, gait problems, headaches and weakness  Endocrine: negative for diabetic symptoms including polydipsia, polyuria and weight loss     Objective:   Physical Exam  Gen. Pleasant, thin, in no distress, normal affect ENT - no lesions, no post nasal drip Neck: No JVD, no thyromegaly, no carotid bruits Lungs: no use of accessory muscles, no dullness to percussion, bibasal rales, no rhonchi  Cardiovascular: Rhythm regular, heart sounds  normal, no murmurs or gallops, no peripheral edema Abdomen: soft and non-tender, no hepatosplenomegaly, BS normal. Musculoskeletal: No deformities, no cyanosis or clubbing Neuro:  alert, non focal        Assessment & Plan:

## 2015-01-13 NOTE — Patient Instructions (Signed)
Continue on Oxygen 2l/m with activity and At bedtime   Will send order to [facility] for evaluation for POC  Follow up Dr. Elsworth Soho  In 4 months and As needed

## 2015-01-13 NOTE — Assessment & Plan Note (Signed)
Appears compensated   Plan  Continue on Oxygen 2l/m with activity and At bedtime   Will send order to [facility] for evaluation for POC  Follow up Dr. Elsworth Soho  In 4 months and As needed

## 2015-01-15 NOTE — Progress Notes (Signed)
Reviewed & agree with plan  

## 2015-01-17 ENCOUNTER — Ambulatory Visit (INDEPENDENT_AMBULATORY_CARE_PROVIDER_SITE_OTHER): Payer: Medicare Other | Admitting: *Deleted

## 2015-01-17 DIAGNOSIS — I442 Atrioventricular block, complete: Secondary | ICD-10-CM

## 2015-01-17 NOTE — Progress Notes (Signed)
Remote pacemaker transmission.   

## 2015-01-20 ENCOUNTER — Encounter: Payer: Self-pay | Admitting: Internal Medicine

## 2015-01-20 LAB — MDC_IDC_ENUM_SESS_TYPE_REMOTE
Battery Remaining Longevity: 93 mo
Battery Remaining Percentage: 71 %
Battery Voltage: 2.95 V
Brady Statistic RV Percent Paced: 28 %
Implantable Pulse Generator Model: 2210
Implantable Pulse Generator Serial Number: 7228315
Lead Channel Impedance Value: 410 Ohm
Lead Channel Impedance Value: 540 Ohm
Lead Channel Sensing Intrinsic Amplitude: 3 mV
Lead Channel Setting Pacing Amplitude: 2.5 V
Lead Channel Setting Sensing Sensitivity: 2 mV
MDC IDC MSMT LEADCHNL RV SENSING INTR AMPL: 12 mV
MDC IDC SESS DTM: 20160125081316
MDC IDC SET LEADCHNL RA PACING AMPLITUDE: 2 V
MDC IDC SET LEADCHNL RV PACING PULSEWIDTH: 0.4 ms
MDC IDC STAT BRADY AP VP PERCENT: 1 %
MDC IDC STAT BRADY AP VS PERCENT: 1.9 %
MDC IDC STAT BRADY AS VP PERCENT: 28 %
MDC IDC STAT BRADY AS VS PERCENT: 69 %
MDC IDC STAT BRADY RA PERCENT PACED: 2.1 %

## 2015-01-27 DIAGNOSIS — C61 Malignant neoplasm of prostate: Secondary | ICD-10-CM | POA: Diagnosis not present

## 2015-01-28 NOTE — Telephone Encounter (Signed)
Spoke w/ pt's daughter, [name]. Let her know pt's remote, as long it's always connected, is completely automatic. She understands pt does not need to be present at exact time of scheduled remote. She is aware of next automatic remote on 04/19/15.  [name] also wanted clarification about an episode on 11/18/15. She states her father had an episode of presyncope requiring him to sit on his walker until he recovered. He had been active that day more so than usual going to appointments. After reviewing the pacer recording, I concluded there was not enough info to draw a firm conclusion. Recording only shows short run of SVT, recording only 16sec duration. [name] inquired if she should see Dr. Caryl Comes for further review. I let her know an in-office interrogation would not provide more info---info would be identical to home remote report.  [name] will review the symptoms w/ Dr. Stanford Breed 02/01/15.

## 2015-01-31 ENCOUNTER — Encounter: Payer: Self-pay | Admitting: Cardiology

## 2015-01-31 ENCOUNTER — Other Ambulatory Visit: Payer: Self-pay | Admitting: Internal Medicine

## 2015-01-31 NOTE — Progress Notes (Signed)
HPI: FU CAD. Patient had CABG 1997. Abd ultrasound 8/07 showed no aneurysm. Inferior MI 11/07; Severe 3 vessel disease; occluded SVG to RCA; SVG to OM subtotalled, SVG to diagonal with ostial 70 and 80 mid; EF 45. PCI of SVG to OM. FU myovue showed EF 56 with inferior infarct and very mild peri-infarct ischemia. ABI 5/12-left mildly abnormal; normal right. Carotid dopplers 12/14 showed 1-39% bilateral stenosis. Last echocardiogram October 2015 showed an ejection fraction of 40-45%. There is grade 1 diastolic dysfunction. There was mild aortic and mitral regurgitation. There was a small pericardial effusion and moderate left atrial enlargement. Also s/p pacemaker. Previously noted to have atrial fibrillation on his device. Since last seen, he is on oxygen now continuously. He denies dyspnea, chest pain or palpitations. He apparently had a brief syncopal episode back in November. He was ambulating and became significantly short of breath with brief loss of consciousness. He has not fallen.  Current Outpatient Prescriptions  Medication Sig Dispense Refill  . alendronate (FOSAMAX) 70 MG tablet TAKE 1 TAB ONCE A WK AT LEAST 30 MIN BEFORE 1ST FOOD.DO NOT LIE DOWN FOR 30 MIN AFTER TAKING. 12 tablet 3  . atorvastatin (LIPITOR) 40 MG tablet TAKE 1 TABLET ONCE A DAY TO CONTROL CHOLESTEROL. 90 tablet 0  . donepezil (ARICEPT) 5 MG tablet 1 po daily for 1 month then 2 tabs daily 60 tablet 4  . feeding supplement, GLUCERNA SHAKE, (GLUCERNA SHAKE) LIQD Take 237 mLs by mouth 2 (two) times daily between meals. (Patient taking differently: Take 237 mLs by mouth as needed. )  0  . fenofibrate 160 MG tablet TAKE 1 TABLET ONCE A DAY TO CONTROL CHOLESTEROL. 30 tablet 0  . furosemide (LASIX) 20 MG tablet Take 1 tablet (20 mg total) by mouth every other day. (Patient taking differently: Take 20 mg by mouth daily. ) 30 tablet   . Memantine HCl ER 28 MG CP24 Take 28 mg by mouth daily.    . metFORMIN (GLUCOPHAGE) 500 MG  tablet One each morning to control glucose 90 tablet 3  . nitroGLYCERIN (NITROSTAT) 0.4 MG SL tablet Place 0.4 mg under the tongue every 5 (five) minutes as needed for chest pain.    . Rivaroxaban (XARELTO) 15 MG TABS tablet Take 1 tablet (15 mg total) by mouth daily with supper. 30 tablet 6  . solifenacin (VESICARE) 10 MG tablet Take 10 mg by mouth daily. To decrease urinary urgency    . tamsulosin (FLOMAX) 0.4 MG CAPS capsule Take 0.4 mg by mouth daily after breakfast. For bladder     No current facility-administered medications for this visit.     Past Medical History  Diagnosis Date  . ANEMIA-IRON DEFICIENCY 05/27/2007  . ANXIETY 11/14/2007  . Intermittent high-grade heart block 09/05/2009    s/p pacemaker  . BENIGN PROSTATIC HYPERTROPHY 11/14/2007  . CAROTID ARTERY DISEASE 10/02/2010  . Pacemaker-St. Jude 02/02/2008    St. Jude  . COLONIC POLYPS, HX OF 05/27/2007  . CORONARY ARTERY DISEASE 11/14/2007    prior bypass [phone]; Inferior MI 11/07; Severe 3 vessel disease; occluded SVG to RCA; SVG to OM subtotalled, SVG to diagonal with ostial 70 and 80 mid; EF 45. PCI of SVG to OM. FU myovue showed EF 56 with inferior infarct and very mild peri-infarct ischemia  . DEPRESSION 11/14/2007  . DIABETES MELLITUS, TYPE II 11/14/2007  . DIVERTICULITIS, HX OF 05/27/2007  . DVT (deep venous thrombosis) 03/21/2011  . FOOT PAIN, BILATERAL 12/08/2010  .  GERD 11/14/2007  . HYPERLIPIDEMIA 11/14/2007  . HYPERTENSION 11/14/2007  . HYPOTENSION, ORTHOSTATIC 03/06/2011  . Memory loss 06/01/2008  . OSTEOPOROSIS 06/02/2010  . PERIPHERAL VASCULAR DISEASE 11/14/2007  . RENAL INSUFFICIENCY 11/14/2007  . SECONDARY DM W/PERIPHERAL CIRC D/O UNCONTROLLED 11/07/2010  . SYNCOPE 09/08/2008  . Unspecified hearing loss 05/13/2009  . Prostate cancer   . Myalgia and myositis, unspecified 04/24/2011  . Other malaise and fatigue 04/24/2011  . Peripheral neuropathy 03/27/2011  . Seborrheic keratosis 06/05/2011  . Osteoarthrosis,  unspecified whether generalized or localized, unspecified site   . Ischemic cardiomyopathy     a. echo (10/15):  mod focal basal septal hypertrophy, EF 40-45%, inf AK, Gr 1 DD, Al sclerosis, mild AI, MAC, mild MR, mod LAE, lipomatous hypertrophy, small effusion (no hemodynamic compromise)     Past Surgical History  Procedure Laterality Date  . S/p coronary stent x 1    . Cholecystectomy    . S/p bilat knee replacement  Cleon Gustin, MD  . Pacemaker insertion  2012    Caryl Comes, MD  . Prostate needle biopsy    . Cystoscopy    . Appendectomy    . Coronary artery bypass graft  2007    History   Social History  . Marital Status: Widowed    Spouse Name: N/A    Number of Children: 2  . Years of Education: College   Occupational History  . retired operated [employer]  . Smoking status: Former Smoker -- 1.00 packs/day for 50 years    Types: Cigarettes    Quit date: 10/18/1983  . Smokeless tobacco: Never Used  . Alcohol Use: No  . Drug Use: No  . Sexual Activity: No   Other Topics Concern  . Not on file   Social History Narrative   Patient is widowed with 2 children.   Patient has a college education.   Patient is right handed.   Patient drinks 2 cups daily.   Walking with wallker    ROS: no fevers or chills, productive cough, hemoptysis, dysphasia, odynophagia, melena, hematochezia, dysuria, hematuria, rash, seizure activity, orthopnea, PND, pedal edema, claudication. Remaining systems are negative.  Physical Exam: Well-developed frail in no acute distress.  Skin is warm and dry.  HEENT is normal.  Neck is supple.  Chest with diminished breath sounds throughout Cardiovascular exam is regular rate and rhythm.  Abdominal exam nontender or distended. No masses palpated. Extremities show no edema. neuro grossly intact  ECG sinus rhythm at a rate of 67. First-degree AV block. Cannot rule out prior septal infarct. Nonspecific ST  changes.

## 2015-02-01 ENCOUNTER — Encounter: Payer: Self-pay | Admitting: Cardiology

## 2015-02-01 ENCOUNTER — Ambulatory Visit (INDEPENDENT_AMBULATORY_CARE_PROVIDER_SITE_OTHER): Payer: Medicare Other | Admitting: Cardiology

## 2015-02-01 VITALS — BP 116/70 | HR 67 | Ht 69.0 in | Wt 185.7 lb

## 2015-02-01 DIAGNOSIS — I25810 Atherosclerosis of coronary artery bypass graft(s) without angina pectoris: Secondary | ICD-10-CM

## 2015-02-01 DIAGNOSIS — I2510 Atherosclerotic heart disease of native coronary artery without angina pectoris: Secondary | ICD-10-CM

## 2015-02-01 DIAGNOSIS — I10 Essential (primary) hypertension: Secondary | ICD-10-CM | POA: Diagnosis not present

## 2015-02-01 DIAGNOSIS — Z950 Presence of cardiac pacemaker: Secondary | ICD-10-CM | POA: Diagnosis not present

## 2015-02-01 DIAGNOSIS — I5042 Chronic combined systolic (congestive) and diastolic (congestive) heart failure: Secondary | ICD-10-CM

## 2015-02-01 DIAGNOSIS — I480 Paroxysmal atrial fibrillation: Secondary | ICD-10-CM

## 2015-02-01 DIAGNOSIS — I2583 Coronary atherosclerosis due to lipid rich plaque: Secondary | ICD-10-CM

## 2015-02-01 DIAGNOSIS — R55 Syncope and collapse: Secondary | ICD-10-CM

## 2015-02-01 NOTE — Assessment & Plan Note (Signed)
Continue present dose of Lasix. Euvolemic on examination. 

## 2015-02-01 NOTE — Assessment & Plan Note (Signed)
Patient had a previous episode of syncope 3 months ago. Occurred while he was dyspneic. Etiology unclear. Would like to be conservative if possible given age and overall frail body habitus.

## 2015-02-01 NOTE — Assessment & Plan Note (Signed)
Blood pressure borderline. Would not favor adding additional medications.

## 2015-02-01 NOTE — Assessment & Plan Note (Signed)
Follow-up electrophysiology. 

## 2015-02-01 NOTE — Assessment & Plan Note (Signed)
Continue statin. Not on aspirin even need for anticoagulation.

## 2015-02-01 NOTE — Assessment & Plan Note (Signed)
Patient remains in sinus. Continue xarelto. Check hemoglobin and renal function in 6 months.

## 2015-02-01 NOTE — Patient Instructions (Signed)
Your physician wants you to follow-up in: 6 MONTHS WITH DR CRENSHAW You will receive a reminder letter in the mail two months in advance. If you don't receive a letter, please call our office to schedule the follow-up appointment.  

## 2015-02-01 NOTE — Assessment & Plan Note (Signed)
Continue statin. 

## 2015-02-02 ENCOUNTER — Encounter: Payer: Self-pay | Admitting: Internal Medicine

## 2015-02-02 DIAGNOSIS — R351 Nocturia: Secondary | ICD-10-CM | POA: Diagnosis not present

## 2015-02-02 DIAGNOSIS — N3941 Urge incontinence: Secondary | ICD-10-CM | POA: Diagnosis not present

## 2015-02-02 DIAGNOSIS — C61 Malignant neoplasm of prostate: Secondary | ICD-10-CM | POA: Diagnosis not present

## 2015-02-07 DIAGNOSIS — E119 Type 2 diabetes mellitus without complications: Secondary | ICD-10-CM | POA: Diagnosis not present

## 2015-02-07 DIAGNOSIS — D510 Vitamin B12 deficiency anemia due to intrinsic factor deficiency: Secondary | ICD-10-CM | POA: Diagnosis not present

## 2015-02-07 DIAGNOSIS — E785 Hyperlipidemia, unspecified: Secondary | ICD-10-CM | POA: Diagnosis not present

## 2015-02-07 DIAGNOSIS — I10 Essential (primary) hypertension: Secondary | ICD-10-CM | POA: Diagnosis not present

## 2015-02-07 DIAGNOSIS — I255 Ischemic cardiomyopathy: Secondary | ICD-10-CM | POA: Diagnosis not present

## 2015-02-07 LAB — LIPID PANEL
Cholesterol: 132 mg/dL (ref 0–200)
HDL: 33 mg/dL — AB (ref 35–70)
LDL Cholesterol: 76 mg/dL
Triglycerides: 115 mg/dL (ref 40–160)

## 2015-02-07 LAB — HEPATIC FUNCTION PANEL
ALK PHOS: 43 U/L (ref 25–125)
ALT: 10 U/L (ref 10–40)
AST: 21 U/L (ref 14–40)
Bilirubin, Total: 0.5 mg/dL

## 2015-02-07 LAB — HEMOGLOBIN A1C: Hgb A1c MFr Bld: 6.6 % — AB (ref 4.0–6.0)

## 2015-02-07 LAB — BASIC METABOLIC PANEL WITH GFR
BUN: 30 mg/dL — AB (ref 4–21)
Creatinine: 1.2 mg/dL (ref 0.6–1.3)
Glucose: 119 mg/dL
Potassium: 4.3 mmol/L (ref 3.4–5.3)
Sodium: 137 mmol/L (ref 137–147)

## 2015-02-10 DIAGNOSIS — I5020 Unspecified systolic (congestive) heart failure: Secondary | ICD-10-CM | POA: Diagnosis not present

## 2015-02-11 ENCOUNTER — Encounter: Payer: Self-pay | Admitting: *Deleted

## 2015-02-14 ENCOUNTER — Encounter: Payer: Self-pay | Admitting: Nurse Practitioner

## 2015-02-14 ENCOUNTER — Ambulatory Visit (INDEPENDENT_AMBULATORY_CARE_PROVIDER_SITE_OTHER): Payer: Medicare Other | Admitting: Nurse Practitioner

## 2015-02-14 VITALS — BP 125/64 | HR 62 | Ht 69.0 in | Wt 186.2 lb

## 2015-02-14 DIAGNOSIS — R413 Other amnesia: Secondary | ICD-10-CM | POA: Diagnosis not present

## 2015-02-14 MED ORDER — MEMANTINE HCL ER 28 MG PO CP24: 28.0000 mg | ORAL_CAPSULE | Freq: Every day | ORAL | Status: DC

## 2015-02-14 MED ORDER — DONEPEZIL HCL 5 MG PO TABS: ORAL_TABLET | ORAL | Status: DC

## 2015-02-14 NOTE — Progress Notes (Signed)
[facility]  PATIENT: Zachary Harris DOB: 02-Jan-1923   REASON FOR VISIT: Follow-up for memory loss  HISTORY FROM: Patient and daughter    HISTORY OF PRESENT ILLNESS: Zachary Harris is a 81 years old gentleman , accompanied by her daughter Zachary Harris, referred by his care physician Dr. Raliegh Scarlet for evaluation of memory trouble. 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 Zachary Harris, 81 year old returns for follow-up with his daughter who lives in [place]. He has a history of memory disorder and is currently on Namenda and Aricept without side effects to the medication. He remains on oxygen at 2 L a minute continuously. He has sitters that come and stay with him. Daughter reports good appetite, sleeping well at night, no behavior issues. Using a rolling walker at all times particularly when outside. Plays Scrabble for memory stimulation. CT head showed 1. Moderate frontal, parietal, temporal atrophy. Mild ventriculomegaly on ex vacuo basis. 2. Mild chronic small  vessel ischemic disease.  3. Right parietal convexity encephalomalacia, may represent chronic ischemic infarction. 4. No acute findings.  Lab showed normal B12, TSH, mild elevated creat 1.3. Reviewed recent basic metabolic panel hemoglobin A1c and lipid profile. He returns for reevaluation   REVIEW OF SYSTEMS: Full 14 system review of systems performed and notable only for those listed, all others are neg:  Constitutional: neg  Cardiovascular: neg Ear/Nose/Throat: neg  Skin: neg Eyes: Dry eyes  Respiratory: O2 dependent Gastroitestinal: Urinary frequency, incontinence of bowel Hematology/Lymphatic: neg  Endocrine: neg Musculoskeletal:neg Allergy/Immunology: neg Neurological: Memory loss Psychiatric: neg Sleep : neg   ALLERGIES: No Known Allergies  HOME MEDICATIONS: Outpatient Prescriptions Prior to Visit  Medication Sig Dispense Refill  . alendronate (FOSAMAX) 70 MG tablet TAKE 1 TAB ONCE A WK AT LEAST 30 MIN BEFORE 1ST FOOD.DO NOT LIE DOWN FOR 30 MIN AFTER TAKING. 12 tablet 3  . atorvastatin (LIPITOR) 40 MG tablet TAKE 1 TABLET ONCE A DAY TO CONTROL CHOLESTEROL. 90 tablet 0  . donepezil (ARICEPT) 5 MG tablet 1 po daily for 1 month then 2 tabs daily 60 tablet 4  . fenofibrate 160 MG tablet TAKE 1 TABLET ONCE A DAY TO CONTROL CHOLESTEROL. 30 tablet 0  . furosemide (LASIX) 20 MG tablet Take 1 tablet (20 mg total) by mouth every other day. (Patient taking differently: Take 20 mg by mouth daily. ) 30 tablet   . Memantine HCl ER 28 MG CP24 Take 28 mg by mouth daily.    . metFORMIN (GLUCOPHAGE) 500 MG tablet One each morning to control glucose  90 tablet 3  . Rivaroxaban (XARELTO) 15 MG TABS tablet Take 1 tablet (15 mg total) by mouth daily with supper. 30 tablet 6  . solifenacin (VESICARE) 10 MG tablet Take 10 mg by mouth daily. To decrease urinary urgency    . tamsulosin (FLOMAX) 0.4 MG CAPS capsule Take 0.4 mg by mouth daily after breakfast. For bladder    . feeding supplement,  GLUCERNA SHAKE, (GLUCERNA SHAKE) LIQD Take 237 mLs by mouth 2 (two) times daily between meals. (Patient not taking: Reported on 02/14/2015)  0  . nitroGLYCERIN (NITROSTAT) 0.4 MG SL tablet Place 0.4 mg under the tongue every 5 (five) minutes as needed for chest pain.     No facility-administered medications prior to visit.    PAST MEDICAL HISTORY: Past Medical History  Diagnosis Date  . ANEMIA-IRON DEFICIENCY 05/27/2007  . ANXIETY 11/14/2007  . Intermittent high-grade heart block 09/05/2009    s/p pacemaker  . BENIGN PROSTATIC HYPERTROPHY 11/14/2007  . CAROTID ARTERY DISEASE 10/02/2010  . Pacemaker-St. Jude 02/02/2008    St. Jude  . COLONIC POLYPS, HX OF 05/27/2007  . CORONARY ARTERY DISEASE 11/14/2007    prior bypass [phone]; Inferior MI 11/07; Severe 3 vessel disease; occluded SVG to RCA; SVG to OM subtotalled, SVG to diagonal with ostial 70 and 80 mid; EF 45. PCI of SVG to OM. FU myovue showed EF 56 with inferior infarct and very mild peri-infarct ischemia  . DEPRESSION 11/14/2007  . DIABETES MELLITUS, TYPE II 11/14/2007  . DIVERTICULITIS, HX OF 05/27/2007  . DVT (deep venous thrombosis) 03/21/2011  . FOOT PAIN, BILATERAL 12/08/2010  . GERD 11/14/2007  . HYPERLIPIDEMIA 11/14/2007  . HYPERTENSION 11/14/2007  . HYPOTENSION, ORTHOSTATIC 03/06/2011  . Memory loss 06/01/2008  . OSTEOPOROSIS 06/02/2010  . PERIPHERAL VASCULAR DISEASE 11/14/2007  . RENAL INSUFFICIENCY 11/14/2007  . SECONDARY DM W/PERIPHERAL CIRC D/O UNCONTROLLED 11/07/2010  . SYNCOPE 09/08/2008  . Unspecified hearing loss 05/13/2009  . Prostate cancer   . Myalgia and myositis, unspecified 04/24/2011  . Other malaise and fatigue 04/24/2011  . Peripheral neuropathy 03/27/2011  . Seborrheic keratosis 06/05/2011  . Osteoarthrosis, unspecified whether generalized or localized, unspecified site   . Ischemic cardiomyopathy     a. echo (10/15):  mod focal basal septal hypertrophy, EF 40-45%, inf AK, Gr 1 DD, Al sclerosis, mild AI, MAC,  mild MR, mod LAE, lipomatous hypertrophy, small effusion (no hemodynamic compromise)     PAST SURGICAL HISTORY: Past Surgical History  Procedure Laterality Date  . S/p coronary stent x 1    . Cholecystectomy    . S/p bilat knee replacement  Cleon Gustin, MD  . Pacemaker insertion  2012    Caryl Comes, MD  . Prostate needle biopsy    . Cystoscopy    . Appendectomy    . Coronary artery bypass graft  2007    FAMILY HISTORY: Family History  Problem Relation Age of Onset  . Diabetes Father     57  . Cancer Mother     SOCIAL HISTORY: History   Social History  . Marital Status: Widowed    Spouse Name: N/A  . Number of Children: 2  . Years of Education: [employer]   Occupational History  . retired operated [employer]  . Smoking status: Former Smoker -- 1.00 packs/day for 50 years    Types: Cigarettes    Quit date: 10/18/1983  . Smokeless tobacco: Never Used  . Alcohol Use: No  .  Drug Use: No  . Sexual Activity: No   Other Topics Concern  . Not on file   Social History Narrative   Patient is widowed with 2 children.   Patient has a [employer] education.   Patient is right handed.   Patient drinks 2 cups daily.   Walking with wallker     PHYSICAL EXAM  Filed Vitals:   02/14/15 0919  BP: 125/64  Pulse: 62  Height: 5\' 9"  (1.753 m)  Weight: 186 lb 3.2 oz (84.46 kg)   Body mass index is 27.48 kg/(m^2). Generalized: In no acute distress O2 at 2l/min Neck: Supple, no carotid bruits  Musculoskeletal: No deformity  Neurological examination  Mentation: Alert oriented to time, place, history taking, and causual conversation, Mini-Mental Status Examination is 26 out of 30, missing items in orientation and recall. AFT 9. Clock drawing 3/4.  Cranial nerve II-XII: Pupils were equal round reactive to light. Extraocular movements were full. Visual field were full on confrontational test.  Facial sensation and strength were normal.  Hearing was intact to finger rubbing bilaterally. Uvula tongue midline. Head turning and shoulder shrug and were normal and symmetric.Tongue protrusion into cheek strength was normal. Motor: Normal tone, bulk and strength. No focal weakness Sensory: Intact to fine touch, pinprick, preserved vibratory sensation, and proprioception at toes. Coordination: Normal finger to nose, heel-to-shin bilaterally  Gait: cautious but steady gait, ambulates with rolling walker, no difficulty with turns Romberg signs: Negative Deep tendon reflexes: Brachioradialis 2/2, biceps 2/2, triceps 2/2, patellar 2/2, Achilles 2/2, plantar responses were flexor bilaterally.  DIAGNOSTIC DATA (LABS, IMAGING, TESTING) - I reviewed patient records, labs, notes, testing and imaging myself where available.  Lab Results  Component Value Date   WBC 4.8 12/09/2014   HGB 13.0 12/09/2014   HCT 39.1 12/09/2014   MCV 89.9 12/09/2014   PLT 152 12/09/2014      Component Value Date/Time   NA 137 02/07/2015   NA 136 12/09/2014 1124   K 4.3 02/07/2015   CL 101 12/09/2014 1124   CO2 27 12/09/2014 1124   GLUCOSE 129* 12/09/2014 1124   GLUCOSE 130* 07/30/2014 1434   GLUCOSE 121* 01/02/2007 0858   BUN 30* 02/07/2015   BUN 34* 12/09/2014 1124   CREATININE 1.2 02/07/2015   CREATININE 1.21 12/09/2014 1124   CREATININE 1.61* 10/10/2014 0355   CALCIUM 9.8 12/09/2014 1124   CALCIUM 9.5 06/02/2010 2048   PROT 7.3 10/08/2014 1021   PROT 7.0 07/30/2014 1434   ALBUMIN 3.6 10/08/2014 1021   AST 21 02/07/2015   ALT 10 02/07/2015   ALKPHOS 43 02/07/2015   BILITOT 0.9 10/08/2014 1021   GFRNONAA 53* 12/09/2014 1124   GFRNONAA 36* 10/10/2014 0355   GFRAA 61 12/09/2014 1124   GFRAA 42* 10/10/2014 0355   Lab Results  Component Value Date   CHOL 132 02/07/2015   HDL 33* 02/07/2015   LDLCALC 76 02/07/2015   LDLDIRECT 85.6 07/19/2010   TRIG 115 02/07/2015   CHOLHDL 4 03/06/2011   Lab Results  Component Value Date   HGBA1C 6.6*  02/07/2015   Lab Results  Component Value Date   VITAMINB12 307 07/30/2014   Lab Results  Component Value Date   TSH 3.830 10/08/2014      ASSESSMENT AND PLAN  81 y.o. year old male  has a past medical history of gradual onset of memory troubles, differential diagnosis includes normal aging versus mild cognitive impairment. No acute findings on CT scan. There has been no change  in his memory since last seen, continues to have sitters  during the day and is left alone at night. No safety issues identified.  Continue Namenda at current dose will refill Continue Aricept at current dose refill Continue to play strategy games such as scrabble, cross words, chess.  Moderate exercise by walking with rolling walker.  Follow-up in 6 months, next visit with Dr. Luan Pulling, [facility], [facility], APRN  [facility] [address] [place], [place] [address] [phone]

## 2015-02-14 NOTE — Patient Instructions (Signed)
Continue Namenda at current dose will refill Continue Aricept at current dose refill Continue to be strategy games such as scrabble Follow-up in 6 months, next visit with Dr. Krista Blue

## 2015-02-15 ENCOUNTER — Non-Acute Institutional Stay: Payer: Medicare Other | Admitting: Internal Medicine

## 2015-02-15 ENCOUNTER — Other Ambulatory Visit: Payer: Self-pay | Admitting: Internal Medicine

## 2015-02-15 ENCOUNTER — Encounter: Payer: Self-pay | Admitting: Internal Medicine

## 2015-02-15 VITALS — BP 136/64 | HR 68 | Temp 98.0°F | Wt 187.0 lb

## 2015-02-15 DIAGNOSIS — E785 Hyperlipidemia, unspecified: Secondary | ICD-10-CM

## 2015-02-15 DIAGNOSIS — I10 Essential (primary) hypertension: Secondary | ICD-10-CM | POA: Diagnosis not present

## 2015-02-15 DIAGNOSIS — I5042 Chronic combined systolic (congestive) and diastolic (congestive) heart failure: Secondary | ICD-10-CM

## 2015-02-15 DIAGNOSIS — E1142 Type 2 diabetes mellitus with diabetic polyneuropathy: Secondary | ICD-10-CM

## 2015-02-15 DIAGNOSIS — R413 Other amnesia: Secondary | ICD-10-CM

## 2015-02-15 DIAGNOSIS — G629 Polyneuropathy, unspecified: Secondary | ICD-10-CM

## 2015-02-15 DIAGNOSIS — N3941 Urge incontinence: Secondary | ICD-10-CM

## 2015-02-15 DIAGNOSIS — C61 Malignant neoplasm of prostate: Secondary | ICD-10-CM

## 2015-02-15 NOTE — Progress Notes (Signed)
Patient ID: Zachary Harris, male   DOB: 05-10-1923, 81 y.o.   MRN: [id number]    [facility]    Place of Service: Clinic (12) OFFICE   No Known Allergies  Chief Complaint  Patient presents with   Medical Management of Chronic Issues    blood sugar, blood pressure, dyspnea, memory    HPI:  Chronic combined systolic and diastolic CHF (congestive heart failure): Compensated. He is now on oxygen 24/7 with recommendation of pulmonologist, Dr. Davonna Belling.  DM type 2 with diabetic peripheral neuropathy: Controlled  Essential hypertension: Controlled  Hyperlipemia: Controlled  Memory loss: Unchanged  Malignant neoplasm of prostate: Continues to see Dr. Roni Bread, urologist. PSA is slightly high. He is under no active treatment at this time.  Urge incontinence: Has seen Dr. Roni Bread. Recommended use of oxybutynin 3 times a day. Patient is not always able to remember to take this medication. He has no real complaints today about the incontinence.    Medications: Patient's Medications  New Prescriptions   No medications on file  Previous Medications   ALENDRONATE (FOSAMAX) 70 MG TABLET    TAKE 1 TAB ONCE A WK AT LEAST 30 MIN BEFORE 1ST FOOD.DO NOT LIE DOWN FOR 30 MIN AFTER TAKING.   ATORVASTATIN (LIPITOR) 40 MG TABLET    TAKE 1 TABLET ONCE A DAY TO CONTROL CHOLESTEROL.   DONEPEZIL (ARICEPT) 5 MG TABLET    1 po daily for 1 month then 2 tabs daily   FEEDING SUPPLEMENT, GLUCERNA SHAKE, (GLUCERNA SHAKE) LIQD    Take 237 mLs by mouth 2 (two) times daily between meals.   FENOFIBRATE 160 MG TABLET    TAKE 1 TABLET ONCE A DAY TO CONTROL CHOLESTEROL.   FUROSEMIDE (LASIX) 20 MG TABLET    Take 1 tablet (20 mg total) by mouth every other day.   MEMANTINE (NAMENDA XR) 28 MG CP24 24 HR CAPSULE    Take 1 capsule (28 mg total) by mouth daily.   METFORMIN (GLUCOPHAGE) 500 MG TABLET    One each morning to control glucose   NITROGLYCERIN (NITROSTAT) 0.4 MG SL TABLET    Place 0.4 mg under the  tongue every 5 (five) minutes as needed for chest pain.   OXYBUTYNIN (DITROPAN) 5 MG TABLET    Take 2.5 mg by mouth 3 (three) times daily.   RIVAROXABAN (XARELTO) 15 MG TABS TABLET    Take 1 tablet (15 mg total) by mouth daily with supper.   SOLIFENACIN (VESICARE) 10 MG TABLET    Take 10 mg by mouth daily. To decrease urinary urgency   TAMSULOSIN (FLOMAX) 0.4 MG CAPS CAPSULE    Take 0.4 mg by mouth daily after breakfast. For bladder  Modified Medications   No medications on file  Discontinued Medications   No medications on file     Review of Systems  Constitutional: Negative for fever, chills, activity change, appetite change, fatigue and unexpected weight change.  HENT: Positive for hearing loss. Negative for ear pain and sinus pressure.   Eyes: Negative for photophobia, pain and visual disturbance.       Corrective lenses  Respiratory: Positive for shortness of breath. Negative for cough, chest tightness and wheezing.   Cardiovascular: Positive for leg swelling.       Pacemaker. History of syncope related to cardiac arrhythmia.  Gastrointestinal: Negative for abdominal pain and abdominal distention.  Endocrine: Negative for cold intolerance, heat intolerance, polydipsia, polyphagia and polyuria.       History of diabetes.  Genitourinary: Positive for urgency and frequency. Negative for dysuria and flank pain.       Urge incontinence. Increased frequency which has not responded well to [place]. Continues to see urologist. Now taking oxybutynin 3 times a day. Episodes of incontinence persist.  Musculoskeletal: Positive for back pain, arthralgias and gait problem.       Poor balance and gait disturbance.  Skin:       Rough area on left cheek  Allergic/Immunologic: Negative.   Neurological: Negative for tremors, seizures, facial asymmetry and weakness.       Memory loss  Hematological: Negative.   Psychiatric/Behavioral: Positive for confusion.       Mild to moderate memory  disturbance.    Filed Vitals:   02/15/15 0910  BP: 136/64  Pulse: 68  Temp: 98 F (36.7 C)  TempSrc: Oral  Weight: 187 lb (84.823 kg)  SpO2: 94%   Body mass index is 27.6 kg/(m^2).  Physical Exam  Constitutional: He is oriented to person, place, and time.  Frail  HENT:  Head: Normocephalic and atraumatic.  Nose: Nose normal.  Bilateral hearing aids.  Eyes: Conjunctivae are normal. Pupils are equal, round, and reactive to light.  Prescription lenses.  Neck: Normal range of motion. Neck supple. No JVD present. No tracheal deviation present. No thyromegaly present.  Cardiovascular: Normal rate and regular rhythm.  Exam reveals no gallop and no friction rub.   Murmur heard.  Systolic murmur is present with a grade of 1/6  Absent DP and PT  Pulmonary/Chest: Effort normal and breath sounds normal. No respiratory distress. He has no wheezes. He has no rales. He exhibits no tenderness.  Using portable oxygen at 2 L/m today.  Abdominal: Soft. Bowel sounds are normal. He exhibits no distension and no mass. There is no tenderness.  Musculoskeletal: Normal range of motion. He exhibits edema. He exhibits no tenderness.  Pain in the left SI joint area. Limps when walking. Favors the left side  Lymphadenopathy:    He has no cervical adenopathy.  Neurological: He is alert and oriented to person, place, and time. He has normal strength. A sensory deficit is present. No cranial nerve deficit. Gait abnormal.  07/13/14 MMSE 28/30. Passed clock drawing. Diminshed sensation to vabration and monofilament.  Skin: Skin is warm and dry. No erythema. No pallor.  Rough area on the left cheek  Psychiatric: He has a normal mood and affect. His behavior is normal. Thought content normal.  Patient is in denial regarding the extent of his memory deficit.     Labs reviewed: Abstract on 02/11/2015  Component Date Value Ref Range Status   Glucose 02/07/2015 119   Final   BUN 02/07/2015 30* 4 - 21  mg/dL Final   Creatinine 02/07/2015 1.2  0.6 - 1.3 mg/dL Final   Potassium 02/07/2015 4.3  3.4 - 5.3 mmol/L Final   Sodium 02/07/2015 137  137 - 147 mmol/L Final   Triglycerides 02/07/2015 115  40 - 160 mg/dL Final   Cholesterol 02/07/2015 132  0 - 200 mg/dL Final   HDL 02/07/2015 33* 35 - 70 mg/dL Final   LDL Cholesterol 02/07/2015 76   Final   Alkaline Phosphatase 02/07/2015 43  25 - 125 U/L Final   ALT 02/07/2015 10  10 - 40 U/L Final   AST 02/07/2015 21  14 - 40 U/L Final   Bilirubin, Total 02/07/2015 0.5   Final   Hgb A1c MFr Bld 02/07/2015 6.6* 4.0 - 6.0 % Final  Clinical Support on 01/17/2015  Component Date Value Ref Range Status   Date Time Interrogation Session 01/20/2015 [id number]   Final   Pulse Generator Manufacturer 01/20/2015 St. Jude Medical   Final   Pulse Gen Model 01/20/2015 [address] RF   Final   Pulse Gen Serial Number 01/20/2015 [id number]   Final   RV Sense Sensitivity 01/20/2015 2   Final   RV Adaptation Mode 01/20/2015 Fixed Pacing   Final   RA Pace Amplitude 01/20/2015 2   Final   RV Pace PulseWidth 01/20/2015 0.4   Final   RV Pace Amplitude 01/20/2015 2.5   Final   Lead Channel Status 01/20/2015    Final   RA Impedance 01/20/2015 410   Final   RA Amplitude 01/20/2015 3   Final   Lead Channel Status 01/20/2015    Final   RV IMPEDANCE 01/20/2015 540   Final   RV Amplitude 01/20/2015 12   Final   Battery Status 01/20/2015 MOS   Final   Battery Longevity 01/20/2015 93   Final   Battery Percent 01/20/2015 71   Final   Battery Voltage 01/20/2015 2.95   Final   Brady RA Perc Paced 01/20/2015 2.1   Final   Brady RV Perc Paced 01/20/2015 28   Final   Brady AP VP Percent 01/20/2015 1   Final   Brady AS VP Percent 01/20/2015 28   Final   Brady AP VS Percent 01/20/2015 1.9   Final   Brady AS VS Percent 01/20/2015 69   Final   Eval Rhythm 01/20/2015 SR   Final   Miscellaneous Comment 01/20/2015    Final                    Value:Pacemaker remote check. Device function reviewed. Impedance, sensing, auto capture thresholds consistent with previous measurements. Histograms appropriate for patient and level of activity. All other diagnostic data reviewed and is appropriate and  stable for patient. Real time/magnet EGM shows appropriate sensing and capture. 4 mode switches-- <1% of time. Longest episode was 20 seconds. + Coumadin. No ventricular high rate episodes. Estimated longevity 7.4 to 8.1 years. Merlin 04-19-15 and ROV in  October with SK.      Assessment/Plan  1. Chronic combined systolic and diastolic CHF (congestive heart failure) Compensated  2. DM type 2 with diabetic peripheral neuropathy Controlled  3. Essential hypertension Controlled  4. Hyperlipemia Controlled  5. Memory loss Unchanged  6. Malignant neoplasm of prostate Continue with your urologic follow-up  7. Urge incontinence Tolerating oxybutynin, but it doesn't seem to be helping a lot.

## 2015-02-15 NOTE — Progress Notes (Signed)
Passed clock drawing 

## 2015-02-16 NOTE — Progress Notes (Signed)
I have reviewed and agreed above plan. 

## 2015-02-18 ENCOUNTER — Other Ambulatory Visit: Payer: Self-pay | Admitting: Internal Medicine

## 2015-02-25 ENCOUNTER — Encounter: Payer: Self-pay | Admitting: Internal Medicine

## 2015-03-10 ENCOUNTER — Other Ambulatory Visit: Payer: Self-pay | Admitting: Internal Medicine

## 2015-03-11 DIAGNOSIS — I5020 Unspecified systolic (congestive) heart failure: Secondary | ICD-10-CM | POA: Diagnosis not present

## 2015-03-15 ENCOUNTER — Emergency Department (HOSPITAL_COMMUNITY): Payer: Medicare Other

## 2015-03-15 ENCOUNTER — Emergency Department (HOSPITAL_COMMUNITY)
Admission: EM | Admit: 2015-03-15 | Discharge: 2015-03-15 | Disposition: A | Discharge status: Home / Self Care | Payer: Medicare Other | Attending: Emergency Medicine | Admitting: Emergency Medicine

## 2015-03-15 ENCOUNTER — Encounter (HOSPITAL_COMMUNITY): Payer: Self-pay

## 2015-03-15 DIAGNOSIS — W010XXA Fall on same level from slipping, tripping and stumbling without subsequent striking against object, initial encounter: Secondary | ICD-10-CM | POA: Insufficient documentation

## 2015-03-15 DIAGNOSIS — R296 Repeated falls: Secondary | ICD-10-CM | POA: Diagnosis not present

## 2015-03-15 DIAGNOSIS — N400 Enlarged prostate without lower urinary tract symptoms: Secondary | ICD-10-CM | POA: Insufficient documentation

## 2015-03-15 DIAGNOSIS — E1142 Type 2 diabetes mellitus with diabetic polyneuropathy: Secondary | ICD-10-CM | POA: Diagnosis not present

## 2015-03-15 DIAGNOSIS — Z87448 Personal history of other diseases of urinary system: Secondary | ICD-10-CM | POA: Insufficient documentation

## 2015-03-15 DIAGNOSIS — M810 Age-related osteoporosis without current pathological fracture: Secondary | ICD-10-CM | POA: Insufficient documentation

## 2015-03-15 DIAGNOSIS — Z862 Personal history of diseases of the blood and blood-forming organs and certain disorders involving the immune mechanism: Secondary | ICD-10-CM | POA: Diagnosis not present

## 2015-03-15 DIAGNOSIS — R413 Other amnesia: Secondary | ICD-10-CM | POA: Diagnosis not present

## 2015-03-15 DIAGNOSIS — Y9301 Activity, walking, marching and hiking: Secondary | ICD-10-CM | POA: Insufficient documentation

## 2015-03-15 DIAGNOSIS — M25462 Effusion, left knee: Secondary | ICD-10-CM | POA: Insufficient documentation

## 2015-03-15 DIAGNOSIS — Y9289 Other specified places as the place of occurrence of the external cause: Secondary | ICD-10-CM | POA: Diagnosis not present

## 2015-03-15 DIAGNOSIS — Z8659 Personal history of other mental and behavioral disorders: Secondary | ICD-10-CM | POA: Diagnosis not present

## 2015-03-15 DIAGNOSIS — M25562 Pain in left knee: Secondary | ICD-10-CM | POA: Diagnosis not present

## 2015-03-15 DIAGNOSIS — Z7901 Long term (current) use of anticoagulants: Secondary | ICD-10-CM | POA: Insufficient documentation

## 2015-03-15 DIAGNOSIS — H9190 Unspecified hearing loss, unspecified ear: Secondary | ICD-10-CM | POA: Diagnosis not present

## 2015-03-15 DIAGNOSIS — Z96653 Presence of artificial knee joint, bilateral: Secondary | ICD-10-CM | POA: Diagnosis not present

## 2015-03-15 DIAGNOSIS — M6281 Muscle weakness (generalized): Secondary | ICD-10-CM | POA: Diagnosis not present

## 2015-03-15 DIAGNOSIS — I10 Essential (primary) hypertension: Secondary | ICD-10-CM | POA: Diagnosis not present

## 2015-03-15 DIAGNOSIS — S8992XA Unspecified injury of left lower leg, initial encounter: Secondary | ICD-10-CM | POA: Diagnosis not present

## 2015-03-15 DIAGNOSIS — Y998 Other external cause status: Secondary | ICD-10-CM | POA: Diagnosis not present

## 2015-03-15 DIAGNOSIS — E119 Type 2 diabetes mellitus without complications: Secondary | ICD-10-CM | POA: Insufficient documentation

## 2015-03-15 DIAGNOSIS — Z86718 Personal history of other venous thrombosis and embolism: Secondary | ICD-10-CM | POA: Insufficient documentation

## 2015-03-15 DIAGNOSIS — Z86010 Personal history of colonic polyps: Secondary | ICD-10-CM | POA: Insufficient documentation

## 2015-03-15 DIAGNOSIS — I480 Paroxysmal atrial fibrillation: Secondary | ICD-10-CM | POA: Diagnosis not present

## 2015-03-15 DIAGNOSIS — Z951 Presence of aortocoronary bypass graft: Secondary | ICD-10-CM | POA: Diagnosis not present

## 2015-03-15 DIAGNOSIS — M25561 Pain in right knee: Secondary | ICD-10-CM | POA: Diagnosis not present

## 2015-03-15 DIAGNOSIS — R031 Nonspecific low blood-pressure reading: Secondary | ICD-10-CM | POA: Diagnosis not present

## 2015-03-15 DIAGNOSIS — Z8546 Personal history of malignant neoplasm of prostate: Secondary | ICD-10-CM | POA: Diagnosis not present

## 2015-03-15 DIAGNOSIS — Z7983 Long term (current) use of bisphosphonates: Secondary | ICD-10-CM | POA: Diagnosis not present

## 2015-03-15 DIAGNOSIS — Z79899 Other long term (current) drug therapy: Secondary | ICD-10-CM | POA: Insufficient documentation

## 2015-03-15 DIAGNOSIS — Z87891 Personal history of nicotine dependence: Secondary | ICD-10-CM | POA: Diagnosis not present

## 2015-03-15 DIAGNOSIS — Z950 Presence of cardiac pacemaker: Secondary | ICD-10-CM | POA: Diagnosis not present

## 2015-03-15 DIAGNOSIS — R55 Syncope and collapse: Secondary | ICD-10-CM | POA: Diagnosis not present

## 2015-03-15 DIAGNOSIS — Z872 Personal history of diseases of the skin and subcutaneous tissue: Secondary | ICD-10-CM | POA: Diagnosis not present

## 2015-03-15 DIAGNOSIS — I2510 Atherosclerotic heart disease of native coronary artery without angina pectoris: Secondary | ICD-10-CM | POA: Diagnosis not present

## 2015-03-15 DIAGNOSIS — R41841 Cognitive communication deficit: Secondary | ICD-10-CM | POA: Diagnosis not present

## 2015-03-15 DIAGNOSIS — R1313 Dysphagia, pharyngeal phase: Secondary | ICD-10-CM | POA: Diagnosis not present

## 2015-03-15 DIAGNOSIS — R0600 Dyspnea, unspecified: Secondary | ICD-10-CM | POA: Diagnosis not present

## 2015-03-15 DIAGNOSIS — R531 Weakness: Secondary | ICD-10-CM | POA: Diagnosis not present

## 2015-03-15 DIAGNOSIS — S8991XA Unspecified injury of right lower leg, initial encounter: Secondary | ICD-10-CM | POA: Diagnosis not present

## 2015-03-15 DIAGNOSIS — I5042 Chronic combined systolic (congestive) and diastolic (congestive) heart failure: Secondary | ICD-10-CM | POA: Diagnosis not present

## 2015-03-15 LAB — BASIC METABOLIC PANEL
Anion gap: 8 (ref 5–15)
BUN: 37 mg/dL — ABNORMAL HIGH (ref 6–23)
CO2: 26 mmol/L (ref 19–32)
CREATININE: 1.3 mg/dL (ref 0.50–1.35)
Calcium: 9.3 mg/dL (ref 8.4–10.5)
Chloride: 106 mmol/L (ref 96–112)
GFR calc non Af Amer: 47 mL/min — ABNORMAL LOW (ref >90)
GFR, EST AFRICAN AMERICAN: 54 mL/min — AB (ref >90)
Glucose, Bld: 132 mg/dL — ABNORMAL HIGH (ref 70–99)
Potassium: 4.3 mmol/L (ref 3.5–5.1)
SODIUM: 140 mmol/L (ref 135–145)

## 2015-03-15 LAB — CBC
HCT: 39.9 % (ref 39.0–52.0)
HEMOGLOBIN: 13 g/dL (ref 13.0–17.0)
MCH: 30.6 pg (ref 26.0–34.0)
MCHC: 32.6 g/dL (ref 30.0–36.0)
MCV: 93.9 fL (ref 78.0–100.0)
Platelets: 142 10*3/uL — ABNORMAL LOW (ref 150–400)
RBC: 4.25 MIL/uL (ref 4.22–5.81)
RDW: 13.5 % (ref 11.5–15.5)
WBC: 7.5 10*3/uL (ref 4.0–10.5)

## 2015-03-15 LAB — TROPONIN I

## 2015-03-15 LAB — BRAIN NATRIURETIC PEPTIDE: B Natriuretic Peptide: 213.8 pg/mL — ABNORMAL HIGH (ref 0.0–100.0)

## 2015-03-15 MED ORDER — HYDROCODONE-ACETAMINOPHEN 5-325 MG PO TABS: 1.0000 | ORAL_TABLET | Freq: Once | ORAL | Status: DC

## 2015-03-15 MED ORDER — HYDROCODONE-ACETAMINOPHEN 5-325 MG PO TABS: 1.0000 | ORAL_TABLET | Freq: Four times a day (QID) | ORAL | Status: DC | PRN

## 2015-03-15 NOTE — ED Notes (Signed)
vicodin not given at this time, patient is sleeping

## 2015-03-15 NOTE — Progress Notes (Signed)
CSW aware of consult will complete appropriate fl2 with EDP.   Belia Heman, [place] [employer]  [employer] [phone]

## 2015-03-15 NOTE — Discharge Instructions (Signed)
Your x-rays today showed that you had swelling in your left knee.  Both knee X-rays did not show any signs of fracture, dislocation, or problems with your hardware.  You were given a referral to orthopedics.  You also will need a referral to physical therapy is may need to be done through your primary care doctor.  Use walker and pain medicine as needed.   Cryotherapy Cryotherapy means treatment with cold. Ice or gel packs can be used to reduce both pain and swelling. Ice is the most helpful within the first 24 to 48 hours after an injury or flare-up from overusing a muscle or joint. Sprains, strains, spasms, burning pain, shooting pain, and aches can all be eased with ice. Ice can also be used when recovering from surgery. Ice is effective, has very few side effects, and is safe for most people to use. PRECAUTIONS  Ice is not a safe treatment option for people with:  Raynaud phenomenon. This is a condition affecting small blood vessels in the extremities. Exposure to cold may cause your problems to return.  Cold hypersensitivity. There are many forms of cold hypersensitivity, including:  Cold urticaria. Red, itchy hives appear on the skin when the tissues begin to warm after being iced.  Cold erythema. This is a red, itchy rash caused by exposure to cold.  Cold hemoglobinuria. Red blood cells break down when the tissues begin to warm after being iced. The hemoglobin that carry oxygen are passed into the urine because they cannot combine with blood proteins fast enough.  Numbness or altered sensitivity in the area being iced. If you have any of the following conditions, do not use ice until you have discussed cryotherapy with your caregiver:  Heart conditions, such as arrhythmia, angina, or chronic heart disease.  High blood pressure.  Healing wounds or open skin in the area being iced.  Current infections.  Rheumatoid arthritis.  Poor circulation.  Diabetes. Ice slows the blood  flow in the region it is applied. This is beneficial when trying to stop inflamed tissues from spreading irritating chemicals to surrounding tissues. However, if you expose your skin to cold temperatures for too long or without the proper protection, you can damage your skin or nerves. Watch for signs of skin damage due to cold. HOME CARE INSTRUCTIONS Follow these tips to use ice and cold packs safely.  Place a dry or damp towel between the ice and skin. A damp towel will cool the skin more quickly, so you may need to shorten the time that the ice is used.  For a more rapid response, add gentle compression to the ice.  Ice for no more than 10 to 20 minutes at a time. The bonier the area you are icing, the less time it will take to get the benefits of ice.  Check your skin after 5 minutes to make sure there are no signs of a poor response to cold or skin damage.  Rest 20 minutes or more between uses.  Once your skin is numb, you can end your treatment. You can test numbness by very lightly touching your skin. The touch should be so light that you do not see the skin dimple from the pressure of your fingertip. When using ice, most people will feel these normal sensations in this order: cold, burning, aching, and numbness.  Do not use ice on someone who cannot communicate their responses to pain, such as small children or people with dementia. HOW TO MAKE AN  ICE PACK Ice packs are the most common way to use ice therapy. Other methods include ice massage, ice baths, and cryosprays. Muscle creams that cause a cold, tingly feeling do not offer the same benefits that ice offers and should not be used as a substitute unless recommended by your caregiver. To make an ice pack, do one of the following:  Place crushed ice or a bag of frozen vegetables in a sealable plastic bag. Squeeze out the excess air. Place this bag inside another plastic bag. Slide the bag into a pillowcase or place a damp towel  between your skin and the bag.  Mix 3 parts water with 1 part rubbing alcohol. Freeze the mixture in a sealable plastic bag. When you remove the mixture from the freezer, it will be slushy. Squeeze out the excess air. Place this bag inside another plastic bag. Slide the bag into a pillowcase or place a damp towel between your skin and the bag. SEEK MEDICAL CARE IF:  You develop white spots on your skin. This may give the skin a blotchy (mottled) appearance.  Your skin turns blue or pale.  Your skin becomes waxy or hard.  Your swelling gets worse. MAKE SURE YOU:   Understand these instructions.  Will watch your condition.  Will get help right away if you are not doing well or get worse. Document Released: 08/06/2011 Document Revised: 04/26/2014 Document Reviewed: 08/06/2011 [facility] Patient Information 2015 [place], [place]. This information is not intended to replace advice given to you by your health care provider. Make sure you discuss any questions you have with your health care provider.  Knee Effusion The medical term for having fluid in your knee is effusion. This is often due to an internal derangement of the knee. This means something is wrong inside the knee. Some of the causes of fluid in the knee may be torn cartilage, a torn ligament, or bleeding into the joint from an injury. Your knee is likely more difficult to bend and move. This is often because there is increased pain and pressure in the joint. The time it takes for recovery from a knee effusion depends on different factors, including:   Type of injury.  Your age.  Physical and medical conditions.  Rehabilitation Strategies. How long you will be away from your normal activities will depend on what kind of knee problem you have and how much damage is present. Your knee has two types of cartilage. Articular cartilage covers the bone ends and lets your knee bend and move smoothly. Two menisci, thick pads of cartilage  that form a rim inside the joint, help absorb shock and stabilize your knee. Ligaments bind the bones together and support your knee joint. Muscles move the joint, help support your knee, and take stress off the joint itself. CAUSES  Often an effusion in the knee is caused by an injury to one of the menisci. This is often a tear in the cartilage. Recovery after a meniscus injury depends on how much meniscus is damaged and whether you have damaged other knee tissue. Small tears may heal on their own with conservative treatment. Conservative means rest, limited weight bearing activity and muscle strengthening exercises. Your recovery may take up to 6 weeks.  TREATMENT  Larger tears may require surgery. Meniscus injuries may be treated during arthroscopy. Arthroscopy is a procedure in which your surgeon uses a small telescope like instrument to look in your knee. Your caregiver can make a more accurate diagnosis (learning  what is wrong) by performing an arthroscopic procedure. If your injury is on the inner margin of the meniscus, your surgeon may trim the meniscus back to a smooth rim. In other cases your surgeon will try to repair a damaged meniscus with stitches (sutures). This may make rehabilitation take longer, but may provide better long term result by helping your knee keep its shock absorption capabilities. Ligaments which are completely torn usually require surgery for repair. HOME CARE INSTRUCTIONS  Use crutches as instructed.  If a brace is applied, use as directed.  Once you are home, an ice pack applied to your swollen knee may help with discomfort and help decrease swelling.  Keep your knee raised (elevated) when you are not up and around or on crutches.  Only take over-the-counter or prescription medicines for pain, discomfort, or fever as directed by your caregiver.  Your caregivers will help with instructions for rehabilitation of your knee. This often includes strengthening  exercises.  You may resume a normal diet and activities as directed. SEEK MEDICAL CARE IF:   There is increased swelling in your knee.  You notice redness, swelling, or increasing pain in your knee.  An unexplained oral temperature above 102 F (38.9 C) develops. SEEK IMMEDIATE MEDICAL CARE IF:   You develop a rash.  You have difficulty breathing.  You have any allergic reactions from medications you may have been given.  There is severe pain with any motion of the knee. MAKE SURE YOU:   Understand these instructions.  Will watch your condition.  Will get help right away if you are not doing well or get worse. Document Released: 03/01/2004 Document Revised: 03/03/2012 Document Reviewed: 05/05/2008 [facility] Patient Information 2015 [place], [place]. This information is not intended to replace advice given to you by your health care provider. Make sure you discuss any questions you have with your health care provider.  Knee Pain The knee is the complex joint between your thigh and your lower leg. It is made up of bones, tendons, ligaments, and cartilage. The bones that make up the knee are:  The femur in the thigh.  The tibia and fibula in the lower leg.  The patella or kneecap riding in the groove on the lower femur. CAUSES  Knee pain is a common complaint with many causes. A few of these causes are:  Injury, such as:  A ruptured ligament or tendon injury.  Torn cartilage.  Medical conditions, such as:  Gout  Arthritis  Infections  Overuse, over training, or overdoing a physical activity. Knee pain can be minor or severe. Knee pain can accompany debilitating injury. Minor knee problems often respond well to self-care measures or get well on their own. More serious injuries may need medical intervention or even surgery. SYMPTOMS The knee is complex. Symptoms of knee problems can vary widely. Some of the problems are:  Pain with movement and weight  bearing.  Swelling and tenderness.  Buckling of the knee.  Inability to straighten or extend your knee.  Your knee locks and you cannot straighten it.  Warmth and redness with pain and fever.  Deformity or dislocation of the kneecap. DIAGNOSIS  Determining what is wrong may be very straight forward such as when there is an injury. It can also be challenging because of the complexity of the knee. Tests to make a diagnosis may include:  Your caregiver taking a history and doing a physical exam.  Routine X-rays can be used to rule out other problems.  X-rays will not reveal a cartilage tear. Some injuries of the knee can be diagnosed by:  Arthroscopy a surgical technique by which a small video camera is inserted through tiny incisions on the sides of the knee. This procedure is used to examine and repair internal knee joint problems. Tiny instruments can be used during arthroscopy to repair the torn knee cartilage (meniscus).  Arthrography is a radiology technique. A contrast liquid is directly injected into the knee joint. Internal structures of the knee joint then become visible on X-ray film.  An MRI scan is a non X-ray radiology procedure in which magnetic fields and a computer produce two- or three-dimensional images of the inside of the knee. Cartilage tears are often visible using an MRI scanner. MRI scans have largely replaced arthrography in diagnosing cartilage tears of the knee.  Blood work.  Examination of the fluid that helps to lubricate the knee joint (synovial fluid). This is done by taking a sample out using a needle and a syringe. TREATMENT The treatment of knee problems depends on the cause. Some of these treatments are:  Depending on the injury, proper casting, splinting, surgery, or physical therapy care will be needed.  Give yourself adequate recovery time. Do not overuse your joints. If you begin to get sore during workout routines, back off. Slow down or do fewer  repetitions.  For repetitive activities such as cycling or running, maintain your strength and nutrition.  Alternate muscle groups. For example, if you are a weight lifter, work the upper body on one day and the lower body the next.  Either tight or weak muscles do not give the proper support for your knee. Tight or weak muscles do not absorb the stress placed on the knee joint. Keep the muscles surrounding the knee strong.  Take care of mechanical problems.  If you have flat feet, orthotics or special shoes may help. See your caregiver if you need help.  Arch supports, sometimes with wedges on the inner or outer aspect of the heel, can help. These can shift pressure away from the side of the knee most bothered by osteoarthritis.  A brace called an "unloader" brace also may be used to help ease the pressure on the most arthritic side of the knee.  If your caregiver has prescribed crutches, braces, wraps or ice, use as directed. The acronym for this is PRICE. This means protection, rest, ice, compression, and elevation.  Nonsteroidal anti-inflammatory drugs (NSAIDs), can help relieve pain. But if taken immediately after an injury, they may actually increase swelling. Take NSAIDs with food in your stomach. Stop them if you develop stomach problems. Do not take these if you have a history of ulcers, stomach pain, or bleeding from the bowel. Do not take without your caregiver's approval if you have problems with fluid retention, heart failure, or kidney problems.  For ongoing knee problems, physical therapy may be helpful.  Glucosamine and chondroitin are over-the-counter dietary supplements. Both may help relieve the pain of osteoarthritis in the knee. These medicines are different from the usual anti-inflammatory drugs. Glucosamine may decrease the rate of cartilage destruction.  Injections of a corticosteroid drug into your knee joint may help reduce the symptoms of an arthritis flare-up. They  may provide pain relief that lasts a few months. You may have to wait a few months between injections. The injections do have a small increased risk of infection, water retention, and elevated blood sugar levels.  Hyaluronic acid injected into damaged joints may  ease pain and provide lubrication. These injections may work by reducing inflammation. A series of shots may give relief for as long as 6 months.  Topical painkillers. Applying certain ointments to your skin may help relieve the pain and stiffness of osteoarthritis. Ask your pharmacist for suggestions. Many over the-counter products are approved for temporary relief of arthritis pain.  In some countries, doctors often prescribe topical NSAIDs for relief of chronic conditions such as arthritis and tendinitis. A review of treatment with NSAID creams found that they worked as well as oral medications but without the serious side effects. PREVENTION  Maintain a healthy weight. Extra pounds put more strain on your joints.  Get strong, stay limber. Weak muscles are a common cause of knee injuries. Stretching is important. Include flexibility exercises in your workouts.  Be smart about exercise. If you have osteoarthritis, chronic knee pain or recurring injuries, you may need to change the way you exercise. This does not mean you have to stop being active. If your knees ache after jogging or playing basketball, consider switching to swimming, water aerobics, or other low-impact activities, at least for a few days a week. Sometimes limiting high-impact activities will provide relief.  Make sure your shoes fit well. Choose footwear that is right for your sport.  Protect your knees. Use the proper gear for knee-sensitive activities. Use kneepads when playing volleyball or laying carpet. Buckle your seat belt every time you drive. Most shattered kneecaps occur in car accidents.  Rest when you are tired. SEEK MEDICAL CARE IF:  You have knee pain  that is continual and does not seem to be getting better.  SEEK IMMEDIATE MEDICAL CARE IF:  Your knee joint feels hot to the touch and you have a high fever. MAKE SURE YOU:   Understand these instructions.  Will watch your condition.  Will get help right away if you are not doing well or get worse. Document Released: 10/07/2007 Document Revised: 03/03/2012 Document Reviewed: 10/07/2007 [facility] Patient Information 2015 [place], [place]. This information is not intended to replace advice given to you by your health care provider. Make sure you discuss any questions you have with your health care provider.

## 2015-03-15 NOTE — ED Notes (Signed)
Pt was unable to stand when [facility] came for transportation, Dr Sharol Given is going to call [facility] to see if they will place him in a skilled area until he was stronger

## 2015-03-15 NOTE — ED Notes (Signed)
Pt transported back to facility by family. Son verbalized understanding discharge instructions. In no acute distress.

## 2015-03-15 NOTE — ED Provider Notes (Signed)
Awaiting a FL2 paperwork, family showed up. They want some labs done to check patient's status and ensure nothing is going on medically that caused him to fall. Patient is relaxing comfortably, eating and drinking okay. Will check basic labs, EKG. Labs and EKG ok. Patient would rather go home because they could coordinate better care with him at home. Stable for discharge.  Evelina Bucy, MD 03/19/15 [phone]

## 2015-03-15 NOTE — ED Notes (Signed)
[facility] called for transportation back to facility

## 2015-03-15 NOTE — ED Notes (Signed)
Pt is ready to be DC. Explained to pt and family that we are waiting for a test result to be resulted before he can be DC. Notified Dr. Mingo  and RN Eloy End called and spoke with lab.

## 2015-03-15 NOTE — ED Notes (Signed)
Pt reports fall yesterday afternoon.  States he does not know why he fell.  Denies tripping on slipping on anything.  Pt c/o L knee pain.  Pt able to move his L knee without difficulty, reports pain is worse with weight bearing.

## 2015-03-15 NOTE — ED Notes (Signed)
Pt was walking in his living room and fell, he complains of knee pain

## 2015-03-15 NOTE — ED Notes (Signed)
Dr Sharol Given spoke with [facility] and they will switch him to a skilled area but the appropriate paperwork needs to be completed, Social Work will need to be consulted in the AM to complete process

## 2015-03-15 NOTE — Progress Notes (Addendum)
CSW met with pt who confirms he resides at [facility]. Patient agreeable to go to snf due to fall and needing further assistance. CSW completing fl2 for MD signature. CSW left message with admission at [facility], [place] at [phone].   Belia Heman, LCSW  Clinical Social Work  [employer] [phone]    CSW informed Narda Rutherford is in a meeting until [place] obtained pasarr Number for Pt. [id number] A. When asked if patient had anyone CSW could call to update, patietn stated he had someone who helps drive but he could contact her. Pt has son and daughter listed as HCPOA in chart from [facility]. Pt son states that no one ever contacted him and is unaware of the plan. CSW meeting with patient.   Belia Heman, LCSW  Clinical Social Work  [facility] [phone]  Patient son and daughter requested patietn to return to his independent apartment. Pt currently recieves day time care and can have night time care privately hired as well. CSW offered any home health services including pt. Pt son declined. Pt son to follow up with [facility] and PCP with any further needs.   Belia Heman, [place] Work  [employer] [phone]

## 2015-03-15 NOTE — ED Provider Notes (Addendum)
CSN: [id number]     Arrival date & time 03/15/15  0134 History   First MD Initiated Contact with Patient 03/15/15 0244     Chief Complaint  Patient presents with  . Knee Pain     (Consider location/radiation/quality/duration/timing/severity/associated sxs/prior Treatment) HPI 81 year old male presents to emergency department from his nursing facility with complaint of bilateral knee pain left greater than right.  Patient reports that he tripped and fell onto his knees.  Pain with flexion of the knee and with weightbearing.  Patient has had bilateral knee replacements done 20 years ago.  Patient is on xarelto for history of DVT. Past Medical History  Diagnosis Date  . ANEMIA-IRON DEFICIENCY 05/27/2007  . ANXIETY 11/14/2007  . Intermittent high-grade heart block 09/05/2009    s/p pacemaker  . BENIGN PROSTATIC HYPERTROPHY 11/14/2007  . CAROTID ARTERY DISEASE 10/02/2010  . Pacemaker-[employer] Jude 02/02/2008    [employer]  . COLONIC POLYPS, HX OF 05/27/2007  . CORONARY ARTERY DISEASE 11/14/2007    prior bypass [phone]; Inferior MI 11/07; Severe 3 vessel disease; occluded SVG to RCA; SVG to OM subtotalled, SVG to diagonal with ostial 70 and 80 mid; EF 45. PCI of SVG to OM. FU myovue showed EF 56 with inferior infarct and very mild peri-infarct ischemia  . DEPRESSION 11/14/2007  . DIABETES MELLITUS, TYPE II 11/14/2007  . DIVERTICULITIS, HX OF 05/27/2007  . DVT (deep venous thrombosis) 03/21/2011  . FOOT PAIN, BILATERAL 12/08/2010  . GERD 11/14/2007  . HYPERLIPIDEMIA 11/14/2007  . HYPERTENSION 11/14/2007  . HYPOTENSION, ORTHOSTATIC 03/06/2011  . Memory loss 06/01/2008  . OSTEOPOROSIS 06/02/2010  . PERIPHERAL VASCULAR DISEASE 11/14/2007  . RENAL INSUFFICIENCY 11/14/2007  . SECONDARY DM W/PERIPHERAL CIRC D/O UNCONTROLLED 11/07/2010  . SYNCOPE 09/08/2008  . Unspecified hearing loss 05/13/2009  . Prostate cancer   . Myalgia and myositis, unspecified 04/24/2011  . Other malaise and fatigue 04/24/2011  .  Peripheral neuropathy 03/27/2011  . Seborrheic keratosis 06/05/2011  . Osteoarthrosis, unspecified whether generalized or localized, unspecified site   . Ischemic cardiomyopathy     a. echo (10/15):  mod focal basal septal hypertrophy, EF 40-45%, inf AK, Gr 1 DD, Al sclerosis, mild AI, MAC, mild MR, mod LAE, lipomatous hypertrophy, small effusion (no hemodynamic compromise)    Past Surgical History  Procedure Laterality Date  . S/p coronary stent x 1    . Cholecystectomy    . S/p bilat knee replacement  Cleon Gustin, MD  . Pacemaker insertion  2012    Caryl Comes, MD  . Prostate needle biopsy    . Cystoscopy    . Appendectomy    . Coronary artery bypass graft  2007   Family History  Problem Relation Age of Onset  . Diabetes Father     18  . Cancer Mother    History  Substance Use Topics  . Smoking status: Former Smoker -- 1.00 packs/day for 50 years    Types: Cigarettes    Quit date: 10/18/1983  . Smokeless tobacco: Never Used  . Alcohol Use: No    Review of Systems   See History of Present Illness; otherwise all other systems are reviewed and negative  Allergies  Review of patient's allergies indicates no known allergies.  Home Medications   Prior to Admission medications   Medication Sig Start Date End Date Taking? Authorizing Provider  alendronate (FOSAMAX) 70 MG tablet TAKE 1 TAB ONCE A WK AT LEAST 30 MIN BEFORE 1ST FOOD.DO NOT LIE DOWN FOR  30 MIN AFTER TAKING. 10/25/14   Estill Dooms, MD  atorvastatin (LIPITOR) 40 MG tablet TAKE 1 TABLET ONCE A DAY TO CONTROL CHOLESTEROL. 12/30/14   Tiffany L Reed, DO  donepezil (ARICEPT) 5 MG tablet 1 po daily for 1 month then 2 tabs daily 02/14/15   Otilio Jefferson, NP  feeding supplement, GLUCERNA SHAKE, (GLUCERNA SHAKE) LIQD Take 237 mLs by mouth 2 (two) times daily between meals. 10/10/14   Scott Joylene Draft, PA-C  fenofibrate 160 MG tablet TAKE 1 TABLET ONCE A DAY TO CONTROL CHOLESTEROL. 03/10/15   Estill Dooms, MD  furosemide (LASIX)  20 MG tablet Take 1 tablet (20 mg total) by mouth every other day. Patient taking differently: Take 20 mg by mouth daily.  10/10/14   Liliane Shi, PA-C  memantine (NAMENDA XR) 28 MG CP24 24 hr capsule Take 1 capsule (28 mg total) by mouth daily. 02/14/15   Otilio Jefferson, NP  metFORMIN (GLUCOPHAGE) 500 MG tablet TAKE 1 TABLET EACH MORNING TO CONTROL GLUCOSE. 02/18/15   Estill Dooms, MD  nitroGLYCERIN (NITROSTAT) 0.4 MG SL tablet Place 0.4 mg under the tongue every 5 (five) minutes as needed for chest pain.    Historical Provider, MD  oxybutynin (DITROPAN) 5 MG tablet Take 2.5 mg by mouth 3 (three) times daily.    Historical Provider, MD  Rivaroxaban (XARELTO) 15 MG TABS tablet Take 1 tablet (15 mg total) by mouth daily with supper. 10/27/14   Lelon Perla, MD  solifenacin (VESICARE) 10 MG tablet Take 10 mg by mouth daily. To decrease urinary urgency    Historical Provider, MD  tamsulosin (FLOMAX) 0.4 MG CAPS capsule Take 0.4 mg by mouth daily after breakfast. For bladder 07/14/13   Historical Provider, MD   BP 153/68 mmHg  Pulse 78  Temp(Src) 98.3 F (36.8 C) (Oral)  Resp 18  SpO2 96% Physical Exam  Constitutional: He appears well-developed and well-nourished. No distress.  Musculoskeletal:  Right knee without crepitus normal range of motion, nontender to palpation.  Patient has no effusion on physical exam.  Left knee with crepitus over the patella, decreased range of motion secondary to pain.  No pain with palpation.  He has a moderate effusion  Nursing note and vitals reviewed.   ED Course  Procedures (including critical care time) Labs Review Labs Reviewed - No data to display  Imaging Review Dg Knee Complete 4 Views Left  03/15/2015   CLINICAL DATA:  Tripped and fell at home.  Pain anteriorly.  EXAM: LEFT KNEE - COMPLETE 4+ VIEW  COMPARISON:  None.  FINDINGS: There are intact appearances of the total knee arthroplasty hardware. There is a moderately large joint effusion  collected in the suprapatellar pouch. There is no evidence of acute fracture. There is no dislocation. No soft tissue foreign body is evident.  IMPRESSION: Moderately large joint effusion. Negative for acute fracture or dislocation.   Electronically Signed   By: Andreas Newport M.D.   On: 03/15/2015 02:58   Dg Knee Complete 4 Views Right  03/15/2015   CLINICAL DATA:  [place] at home  EXAM: RIGHT KNEE - COMPLETE 4+ VIEW  COMPARISON:  None.  FINDINGS: There are intact appearances of the total knee arthroplasty. No fracture or dislocation is evident.  IMPRESSION: Negative for acute fracture or dislocation   Electronically Signed   By: Andreas Newport M.D.   On: 03/15/2015 03:35     EKG Interpretation None      MDM  Final diagnoses:  Knee joint effusion, left  Bilateral knee pain    81 year old male with bilateral knee pain after fall.  X-ray show effusion of the left knee.  No fractures or dislocations, arthroplasty is intact bilaterally.  Patient reports he uses a walker.  Will order physical therapy for evaluation in his nursing facility.  We'll also refer him to orthopedics as he cannot remember his prior orthopedist.  As he is on xarelto, he is not a good candidate for arthrocentesis to drain effusion.    Linton Flemings, MD 03/15/15 0413  5:13 AM Patient lives in independent living, and has been unable to stand despite pain medication here.  [facility] and [facility] are will be able to move into the skilled nursing area, but need an FL 2 filled out.  Will have social worker in the morning help with the reassignment to a higher [facility].  Linton Flemings, MD 03/15/15 [phone]

## 2015-03-17 ENCOUNTER — Non-Acute Institutional Stay (SKILLED_NURSING_FACILITY): Payer: Medicare Other | Admitting: Internal Medicine

## 2015-03-17 ENCOUNTER — Encounter: Payer: Self-pay | Admitting: Internal Medicine

## 2015-03-17 DIAGNOSIS — I5042 Chronic combined systolic (congestive) and diastolic (congestive) heart failure: Secondary | ICD-10-CM | POA: Diagnosis not present

## 2015-03-17 DIAGNOSIS — R413 Other amnesia: Secondary | ICD-10-CM | POA: Diagnosis not present

## 2015-03-17 DIAGNOSIS — R0600 Dyspnea, unspecified: Secondary | ICD-10-CM | POA: Diagnosis not present

## 2015-03-17 DIAGNOSIS — E1022 Type 1 diabetes mellitus with diabetic chronic kidney disease: Secondary | ICD-10-CM

## 2015-03-17 DIAGNOSIS — E1142 Type 2 diabetes mellitus with diabetic polyneuropathy: Secondary | ICD-10-CM | POA: Diagnosis not present

## 2015-03-17 DIAGNOSIS — J9611 Chronic respiratory failure with hypoxia: Secondary | ICD-10-CM

## 2015-03-17 DIAGNOSIS — G629 Polyneuropathy, unspecified: Secondary | ICD-10-CM

## 2015-03-17 DIAGNOSIS — I255 Ischemic cardiomyopathy: Secondary | ICD-10-CM

## 2015-03-17 DIAGNOSIS — I10 Essential (primary) hypertension: Secondary | ICD-10-CM

## 2015-03-17 DIAGNOSIS — I739 Peripheral vascular disease, unspecified: Secondary | ICD-10-CM

## 2015-03-17 DIAGNOSIS — I120 Hypertensive chronic kidney disease with stage 5 chronic kidney disease or end stage renal disease: Secondary | ICD-10-CM | POA: Insufficient documentation

## 2015-03-17 DIAGNOSIS — M25462 Effusion, left knee: Secondary | ICD-10-CM | POA: Diagnosis not present

## 2015-03-17 DIAGNOSIS — N183 Chronic kidney disease, stage 3 unspecified: Secondary | ICD-10-CM

## 2015-03-17 DIAGNOSIS — N185 Chronic kidney disease, stage 5: Secondary | ICD-10-CM

## 2015-03-17 HISTORY — DX: Effusion, left knee: M25.462

## 2015-03-17 HISTORY — DX: Type 1 diabetes mellitus with diabetic chronic kidney disease: E10.22

## 2015-03-17 HISTORY — DX: Chronic kidney disease, stage 3 unspecified: N18.30

## 2015-03-17 NOTE — Progress Notes (Signed)
Patient ID: Zachary Harris, male   DOB: 07-21-1923, 81 y.o.   MRN: [id number]    HISTORY AND PHYSICAL  Location:  [facility] Room Number: A45 Place of Service: SNF (31)   Extended Emergency Contact Information Primary Emergency Contact: Kathline Magic, [place] Phone: [phone] Relation: Daughter Secondary Emergency Contact: Johnnette Gourd [place] Phone: [phone] Mobile Phone: [phone] Relation: Son  Advanced Directive information Does patient have an advance directive?: Yes, Type of Advance Directive: Healthcare Power of [place];Living will;Out of facility DNR (pink MOST or yellow form) (At [facility]), Pre-existing out of facility DNR order (yellow form or pink MOST form): Yellow form placed in chart (order not valid for inpatient use)  Chief Complaint  Patient presents with   New Admit To SNF    HPI:  Patient had a fall. He landed on his knees. The left knee became painful and swelled. He went to the emergency room. Evaluation there did not show any fractures. He was discharged back to [facility], he was admitted to the skilled nursing facility for rehabilitation and strengthening. We anticipate he will be a short-term rehabilitation stay.  Effusion of left knee: Effusion has gone down in the last 48 hours. Actually he was not able to stand and walk with a significant help. He was able to stand with a walker with the therapist in physical therapy today. Pains have substantially improved. He is still tender at the left knee.  DM type 2 with diabetic peripheral neuropathy: Stable  Essential hypertension: Controlled  Dyspnea: Patient is on oxygen almost 24/7.  Chronic combined systolic and diastolic CHF (congestive heart failure): Compensated at this time  Ischemic cardiomyopathy: 40-45% left ventricular ejection fraction on echocardiogram October  2015.  Memory loss: Stable  Peripheral vascular disease: Diminished distal pulses bilaterally  Chronic respiratory failure with hypoxia: Remains on oxygen 24/7 at this time. Although he was told initially that he only needed to be on oxygen at night by his cardiologist, he actually lives almost all the time because he feels better when using the oxygen.    Past Medical History  Diagnosis Date   ANEMIA-IRON DEFICIENCY 05/27/2007   ANXIETY 11/14/2007   Intermittent high-grade heart block 09/05/2009    s/p pacemaker   BENIGN PROSTATIC HYPERTROPHY 11/14/2007   CAROTID ARTERY DISEASE 10/02/2010   Pacemaker-St. Jude 02/02/2008    St. Jude   COLONIC POLYPS, HX OF 05/27/2007   CORONARY ARTERY DISEASE 11/14/2007    prior bypass [phone]; Inferior MI 11/07; Severe 3 vessel disease; occluded SVG to RCA; SVG to OM subtotalled, SVG to diagonal with ostial 70 and 80 mid; EF 45. PCI of SVG to OM. FU myovue showed EF 56 with inferior infarct and very mild peri-infarct ischemia   DEPRESSION 11/14/2007   DIABETES MELLITUS, TYPE II 11/14/2007   DIVERTICULITIS, HX OF 05/27/2007   DVT (deep venous thrombosis) 03/21/2011   FOOT PAIN, BILATERAL 12/08/2010   GERD 11/14/2007   HYPERLIPIDEMIA 11/14/2007   HYPERTENSION 11/14/2007   HYPOTENSION, ORTHOSTATIC 03/06/2011   Memory loss 06/01/2008   OSTEOPOROSIS 06/02/2010   PERIPHERAL VASCULAR DISEASE 11/14/2007   RENAL INSUFFICIENCY 11/14/2007   SECONDARY DM W/PERIPHERAL CIRC D/O UNCONTROLLED 11/07/2010   SYNCOPE 09/08/2008   Unspecified hearing loss 05/13/2009   Prostate cancer    Myalgia and myositis, unspecified 04/24/2011   Other malaise and fatigue  04/24/2011   Peripheral neuropathy 03/27/2011   Seborrheic keratosis 06/05/2011   Osteoarthrosis, unspecified whether generalized or localized, unspecified site    Ischemic cardiomyopathy     a. echo (10/15):  mod focal basal septal hypertrophy, EF 40-45%, inf AK, Gr 1 DD, Al sclerosis, mild  AI, MAC, mild MR, mod LAE, lipomatous hypertrophy, small effusion (no hemodynamic compromise)    Effusion of left knee 03/17/2015    Past Surgical History  Procedure Laterality Date   S/p coronary stent x 1     Cholecystectomy     S/p bilat knee replacement  1992    Eddie Dibbles, MD   Pacemaker insertion  Sheron Nightingale, MD   Prostate needle biopsy     Cystoscopy     Appendectomy     Coronary artery bypass graft  2007    Patient Care Team: Estill Dooms, MD as PCP - General (Internal Medicine) Irine Seal, MD as Consulting Physician (Urology) Renella Cunas, MD as Consulting Physician (Cardiology) [facility] Lelon Perla, MD as Consulting Physician (Cardiology) Man Mast X, NP as Nurse Practitioner (Nurse Practitioner) Rigoberto Noel, MD as Consulting Physician (Pulmonary Disease)  History   Social History   Marital Status: Widowed    Spouse Name: N/A   Number of Children: 2   Years of Education: Copywriter, advertising History   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   Patient has a college education.   Patient is right handed.   Patient drinks 2 cups daily.   Walking with wallker     reports that he quit smoking about 31 years ago. His smoking use included Cigarettes. He has a 50 pack-year smoking history. He has never used smokeless tobacco. He reports that he does not drink alcohol or use illicit drugs.  Family History  Problem Relation Age of Onset   Diabetes Father     66   Cancer Mother    Family Status  Relation Status Death Age   Father Deceased    Mother Deceased    Son Alive    Daughter Alive     Sister Alive    Brother Alive    Brother Alive    Brother Alive    Sister Alive    Sister Alive    Brother Deceased 53    accident    Immunization History  Administered Date(s) Administered   Influenza Whole 09/23/2010, 09/23/2012   Influenza,inj,Quad PF,36+ Mos 10/09/2014   Influenza-Unspecified 09/07/2013, 09/10/2013   Pneumococcal Polysaccharide-23 03/31/2009   Td 03/31/2009    No Known Allergies  Medications: Patient's Medications  New Prescriptions   No medications on file  Previous Medications   ALENDRONATE (FOSAMAX) 70 MG TABLET    TAKE 1 TAB ONCE A WK AT LEAST 30 MIN BEFORE 1ST FOOD.DO NOT LIE DOWN FOR 30 MIN AFTER TAKING.   ATORVASTATIN (LIPITOR) 40 MG TABLET    TAKE 1 TABLET ONCE A DAY TO CONTROL CHOLESTEROL.   DONEPEZIL (ARICEPT) 5 MG TABLET    1 po daily for 1  month then 2 tabs daily   FEEDING SUPPLEMENT, GLUCERNA SHAKE, (GLUCERNA SHAKE) LIQD    Take 237 mLs by mouth 2 (two) times daily between meals.   FENOFIBRATE 160 MG TABLET    TAKE 1 TABLET ONCE A DAY TO CONTROL CHOLESTEROL.   FUROSEMIDE (LASIX) 20 MG TABLET    Take 1 tablet (20 mg total) by mouth every other day.   HYDROCODONE-ACETAMINOPHEN (NORCO/VICODIN) 5-325 MG PER TABLET    Take 1-2 tablets by mouth every 6 (six) hours as needed for moderate pain.   MEMANTINE (NAMENDA XR) 28 MG CP24 24 HR CAPSULE    Take 1 capsule (28 mg total) by mouth daily.   METFORMIN (GLUCOPHAGE) 500 MG TABLET    TAKE 1 TABLET EACH MORNING TO CONTROL GLUCOSE.   NITROGLYCERIN (NITROSTAT) 0.4 MG SL TABLET    Place 0.4 mg under the tongue every 5 (five) minutes as needed for chest pain.   OXYBUTYNIN (DITROPAN) 5 MG TABLET    Take 2.5 mg by mouth 3 (three) times daily.   RIVAROXABAN (XARELTO) 15 MG TABS TABLET    Take 1 tablet (15 mg total) by mouth daily with supper.   SOLIFENACIN (VESICARE) 10 MG TABLET    Take 10 mg by mouth daily. To decrease urinary urgency   TAMSULOSIN (FLOMAX) 0.4 MG CAPS CAPSULE    Take 0.4 mg by  mouth daily after breakfast. For bladder  Modified Medications   No medications on file  Discontinued Medications   No medications on file    Review of Systems  Constitutional: Negative for fever, chills, activity change, appetite change, fatigue and unexpected weight change.  HENT: Positive for hearing loss. Negative for ear pain and sinus pressure.   Eyes: Negative for photophobia, pain and visual disturbance.       Corrective lenses  Respiratory: Positive for shortness of breath. Negative for cough, chest tightness and wheezing.   Cardiovascular: Positive for leg swelling.       Pacemaker. History of syncope related to cardiac arrhythmia.  Gastrointestinal: Negative for abdominal pain and abdominal distention.  Endocrine: Negative for cold intolerance, heat intolerance, polydipsia, polyphagia and polyuria.       History of diabetes.  Genitourinary: Positive for urgency and frequency. Negative for dysuria and flank pain.       Urge incontinence. Increased frequency which has not responded well to Newman. Continues to see urologist. Now taking oxybutynin 3 times a day. Episodes of incontinence persist.  Musculoskeletal: Positive for myalgias, back pain, arthralgias, gait problem and neck pain (Fall on 03/15/15 causing pain in the left knee and effusion.).       Poor balance and gait disturbance.  Skin:       Rough area on left cheek  Allergic/Immunologic: Negative.   Neurological: Negative for tremors, seizures, facial asymmetry and weakness.       Memory loss  Hematological: Negative.   Psychiatric/Behavioral: Positive for confusion.       Mild to moderate memory disturbance.    Filed Vitals:   03/17/15 1654  BP: 139/70  Pulse: 77  Temp: 99.3 F (37.4 C)  Resp: 20  Height: $Remove'5\' 9"'[id number]$  (1.753 m)  Weight: 178 lb 4.8 oz (80.876 kg)   Body mass index is 26.32 kg/(m^2).  Physical Exam  Constitutional: He is oriented to person, place, and time.  Frail  HENT:  Head: Normocephalic  and atraumatic.  Nose: Nose normal.  Bilateral hearing aids.  Eyes: Conjunctivae are normal. Pupils are equal, round, and reactive  to light.  Prescription lenses.  Neck: Normal range of motion. Neck supple. No JVD present. No tracheal deviation present. No thyromegaly present.  Cardiovascular: Normal rate and regular rhythm.  Exam reveals no gallop and no friction rub.   Murmur heard.  Systolic murmur is present with a grade of 1/6  Absent DP and PT  Pulmonary/Chest: Effort normal and breath sounds normal. No respiratory distress. He has no wheezes. He has no rales. He exhibits no tenderness.  Using portable oxygen at 2 L/m today.  Abdominal: Soft. Bowel sounds are normal. He exhibits no distension and no mass. There is no tenderness.  Musculoskeletal: Normal range of motion. He exhibits edema. He exhibits no tenderness.  Pain in the left SI joint area. Limps when walking. Favors the left side. Effusion of the left knee. Scars in the left knee from previous surgery. Tender at the left knee with movement of the patella.  Lymphadenopathy:    He has no cervical adenopathy.  Neurological: He is alert and oriented to person, place, and time. He has normal strength. A sensory deficit is present. No cranial nerve deficit. Gait abnormal.  07/13/14 MMSE 28/30. Passed clock drawing. Diminshed sensation to vabration and monofilament.  Skin: Skin is warm and dry. No erythema. No pallor.  Rough area on the left cheek. Cystic lesion in the mid forehead.  Psychiatric: He has a normal mood and affect. His behavior is normal. Thought content normal.  Patient is in denial regarding the extent of his memory deficit.     Labs reviewed: Admission on 03/15/2015, Discharged on 03/15/2015  Component Date Value Ref Range Status   WBC 03/15/2015 7.5  4.0 - 10.5 K/uL Final   RBC 03/15/2015 4.25  4.22 - 5.81 MIL/uL Final   Hemoglobin 03/15/2015 13.0  13.0 - 17.0 g/dL Final   HCT 03/15/2015 39.9  39.0 - 52.0  % Final   MCV 03/15/2015 93.9  78.0 - 100.0 fL Final   MCH 03/15/2015 30.6  26.0 - 34.0 pg Final   MCHC 03/15/2015 32.6  30.0 - 36.0 g/dL Final   RDW 03/15/2015 13.5  11.5 - 15.5 % Final   Platelets 03/15/2015 142* 150 - 400 K/uL Final   Sodium 03/15/2015 140  135 - 145 mmol/L Final   Potassium 03/15/2015 4.3  3.5 - 5.1 mmol/L Final   Chloride 03/15/2015 106  96 - 112 mmol/L Final   CO2 03/15/2015 26  19 - 32 mmol/L Final   Glucose, Bld 03/15/2015 132* 70 - 99 mg/dL Final   BUN 03/15/2015 37* 6 - 23 mg/dL Final   Creatinine, Ser 03/15/2015 1.30  0.50 - 1.35 mg/dL Final   Calcium 03/15/2015 9.3  8.4 - 10.5 mg/dL Final   GFR calc non Af Amer 03/15/2015 47* >90 mL/min Final   GFR calc Af Amer 03/15/2015 54* >90 mL/min Final   Comment: (NOTE) The eGFR has been calculated using the CKD EPI equation. This calculation has not been validated in all clinical situations. eGFR's persistently <90 mL/min signify possible Chronic Kidney Disease.    Anion gap 03/15/2015 8  5 - 15 Final   B Natriuretic Peptide 03/15/2015 213.8* 0.0 - 100.0 pg/mL Final   Troponin I 03/15/2015 <0.03  <0.031 ng/mL Final   Comment:        NO INDICATION OF MYOCARDIAL INJURY.   Abstract on 02/11/2015  Component Date Value Ref Range Status   Glucose 02/07/2015 119   Final   BUN 02/07/2015 30* 4 - 21 mg/dL  Final   Creatinine 02/07/2015 1.2  0.6 - 1.3 mg/dL Final   Potassium 02/07/2015 4.3  3.4 - 5.3 mmol/L Final   Sodium 02/07/2015 137  137 - 147 mmol/L Final   Triglycerides 02/07/2015 115  40 - 160 mg/dL Final   Cholesterol 02/07/2015 132  0 - 200 mg/dL Final   HDL 02/07/2015 33* 35 - 70 mg/dL Final   LDL Cholesterol 02/07/2015 76   Final   Alkaline Phosphatase 02/07/2015 43  25 - 125 U/L Final   ALT 02/07/2015 10  10 - 40 U/L Final   AST 02/07/2015 21  14 - 40 U/L Final   Bilirubin, Total 02/07/2015 0.5   Final   Hgb A1c MFr Bld 02/07/2015 6.6* 4.0 - 6.0 % Final  Clinical  Support on 01/17/2015  Component Date Value Ref Range Status   Date Time Interrogation Session 01/20/2015 [id number]   Final   Pulse Generator Manufacturer 01/20/2015 St. Jude Medical   Final   Pulse Gen Model 01/20/2015 [address] RF   Final   Pulse Gen Serial Number 01/20/2015 [id number]   Final   RV Sense Sensitivity 01/20/2015 2   Final   RV Adaptation Mode 01/20/2015 Fixed Pacing   Final   RA Pace Amplitude 01/20/2015 2   Final   RV Pace PulseWidth 01/20/2015 0.4   Final   RV Pace Amplitude 01/20/2015 2.5   Final   Lead Channel Status 01/20/2015    Final   RA Impedance 01/20/2015 410   Final   RA Amplitude 01/20/2015 3   Final   Lead Channel Status 01/20/2015    Final   RV IMPEDANCE 01/20/2015 540   Final   RV Amplitude 01/20/2015 12   Final   Battery Status 01/20/2015 MOS   Final   Battery Longevity 01/20/2015 93   Final   Battery Percent 01/20/2015 71   Final   Battery Voltage 01/20/2015 2.95   Final   Brady RA Perc Paced 01/20/2015 2.1   Final   Brady RV Perc Paced 01/20/2015 28   Final   Brady AP VP Percent 01/20/2015 1   Final   Brady AS VP Percent 01/20/2015 28   Final   Brady AP VS Percent 01/20/2015 1.9   Final   Brady AS VS Percent 01/20/2015 69   Final   Eval Rhythm 01/20/2015 SR   Final   Miscellaneous Comment 01/20/2015    Final                   Value:Pacemaker remote check. Device function reviewed. Impedance, sensing, auto capture thresholds consistent with previous measurements. Histograms appropriate for patient and level of activity. All other diagnostic data reviewed and is appropriate and  stable for patient. Real time/magnet EGM shows appropriate sensing and capture. 4 mode switches-- <1% of time. Longest episode was 20 seconds. + Coumadin. No ventricular high rate episodes. Estimated longevity 7.4 to 8.1 years. Merlin 04-19-15 and ROV in  October with SK.     Dg Knee Complete 4 Views Left  03/15/2015   CLINICAL  DATA:  Tripped and fell at home.  Pain anteriorly.  EXAM: LEFT KNEE - COMPLETE 4+ VIEW  COMPARISON:  None.  FINDINGS: There are intact appearances of the total knee arthroplasty hardware. There is a moderately large joint effusion collected in the suprapatellar pouch. There is no evidence of acute fracture. There is no dislocation. No soft tissue foreign body is evident.  IMPRESSION: Moderately large joint effusion. Negative  for acute fracture or dislocation.   Electronically Signed   By: Andreas Newport M.D.   On: 03/15/2015 02:58   Dg Knee Complete 4 Views Right  03/15/2015   CLINICAL DATA:  [place] at home  EXAM: RIGHT KNEE - COMPLETE 4+ VIEW  COMPARISON:  None.  FINDINGS: There are intact appearances of the total knee arthroplasty. No fracture or dislocation is evident.  IMPRESSION: Negative for acute fracture or dislocation   Electronically Signed   By: Andreas Newport M.D.   On: 03/15/2015 03:35     Assessment/Plan  1. Effusion of left knee Painful left knee with small-to-moderate effusion. Improving since injury 2 days ago when he fell on his knees.  2. DM type 2 with diabetic peripheral neuropathy Adequately controlled  3. Essential hypertension Controlled  4. Dyspnea Continue oxygen supplementation  5. Chronic combined systolic and diastolic CHF (congestive heart failure) Compensated  6. Ischemic cardiomyopathy Patient has had problems with low blood pressure past. Ideally he would be on a beta blocker and losartan. He is anticoagulated with Xarelto. We will start losartan 25 mg per day while he is under observation and treatment in the skilled nursing area  7. Memory loss Currently on Namenda  8. Peripheral vascular disease  observe  9. Chronic respiratory failure with hypoxia Continue O2 supplementation  10. CKD stage 3 due to type 1 diabetes mellitus Laboratory the hospital showed elevation in BUN and creatinine. See report above.

## 2015-03-25 ENCOUNTER — Non-Acute Institutional Stay (SKILLED_NURSING_FACILITY): Payer: Medicare Other | Admitting: Nurse Practitioner

## 2015-03-25 DIAGNOSIS — N183 Chronic kidney disease, stage 3 (moderate): Secondary | ICD-10-CM

## 2015-03-25 DIAGNOSIS — G629 Polyneuropathy, unspecified: Secondary | ICD-10-CM

## 2015-03-25 DIAGNOSIS — E1022 Type 1 diabetes mellitus with diabetic chronic kidney disease: Secondary | ICD-10-CM | POA: Diagnosis not present

## 2015-03-25 DIAGNOSIS — I10 Essential (primary) hypertension: Secondary | ICD-10-CM

## 2015-03-25 DIAGNOSIS — M810 Age-related osteoporosis without current pathological fracture: Secondary | ICD-10-CM

## 2015-03-25 DIAGNOSIS — E1142 Type 2 diabetes mellitus with diabetic polyneuropathy: Secondary | ICD-10-CM

## 2015-03-25 DIAGNOSIS — M25462 Effusion, left knee: Secondary | ICD-10-CM

## 2015-03-25 DIAGNOSIS — I480 Paroxysmal atrial fibrillation: Secondary | ICD-10-CM | POA: Diagnosis not present

## 2015-03-25 DIAGNOSIS — N400 Enlarged prostate without lower urinary tract symptoms: Secondary | ICD-10-CM

## 2015-03-25 DIAGNOSIS — I5042 Chronic combined systolic (congestive) and diastolic (congestive) heart failure: Secondary | ICD-10-CM

## 2015-03-25 DIAGNOSIS — I2510 Atherosclerotic heart disease of native coronary artery without angina pectoris: Secondary | ICD-10-CM | POA: Diagnosis not present

## 2015-03-25 DIAGNOSIS — R413 Other amnesia: Secondary | ICD-10-CM

## 2015-03-25 DIAGNOSIS — I2583 Coronary atherosclerosis due to lipid rich plaque: Secondary | ICD-10-CM

## 2015-03-25 NOTE — Progress Notes (Signed)
Patient ID: Zachary Harris, male   DOB: October 25, 1923, 81 y.o.   MRN: [id number]   Code Status: DNR  No Known Allergies  Chief Complaint  Patient presents with  . Medical Management of Chronic Issues  . Hospitalization Follow-up    HPI: Patient is a 81 y.o. male seen in the SNF at [facility] today for evaluation of chronic medical conditions.    ED eval 03/15/15 bilateral knee pain after fall. X-ray show effusion of the left knee. No fractures or dislocations, arthroplasty is intact bilaterally. As he is on xarelto, he is not a good candidate for arthrocentesis to drain effusion.  Problem List Items Addressed This Visit    CAD (coronary artery disease) (Chronic)    No angina since admitted to SNF. Prn NTG, statin, and Xarelto      Chronic combined systolic and diastolic CHF (congestive heart failure) (Chronic)    Clinically compensated. Takes Furosemide 20mg       CKD stage 3 due to type 1 diabetes mellitus    Bun/creat 30/1.2 01/2014      DM type 2 with diabetic peripheral neuropathy (Chronic)    Takes Metformin 500mg        Effusion of left knee    F/u Ortho.       Enlarged prostate without urinary obstruction    No urinary retention. Takes Tamsulosin 0.4mg , Vesicare 10mg , and Oxybutynin 2.5mg  tid.       Essential hypertension (Chronic)    Blood pressure is controlled. Takes Losartan 25mg        Memory loss - Primary (Chronic)    03/16/15 SLUMS 20/30. Takes Aricept 10mg  and Namenda 28mg  daily.       Osteoporosis    Takes Fosamax      Paroxysmal atrial fibrillation    Heart rate is in control. Takes Xarelto 15mg  daily.          Review of Systems:  Review of Systems  Constitutional: Negative for fever, chills, activity change, appetite change, fatigue and unexpected weight change.  HENT: Positive for hearing loss. Negative for ear pain and sinus pressure.   Eyes: Negative for photophobia, pain and visual disturbance.       Corrective lenses    Respiratory: Positive for shortness of breath. Negative for cough, chest tightness and wheezing.   Cardiovascular: Positive for leg swelling.       Pacemaker. History of syncope related to cardiac arrhythmia.  Gastrointestinal: Negative for abdominal pain and abdominal distention.  Endocrine: Negative for cold intolerance, heat intolerance, polydipsia, polyphagia and polyuria.       History of diabetes.  Genitourinary: Positive for urgency and frequency. Negative for dysuria and flank pain.       Urge incontinence. Increased frequency which has not responded well to [place]. Continues to see urologist. Now taking oxybutynin 3 times a day. Episodes of incontinence persist.  Musculoskeletal: Positive for myalgias, back pain, arthralgias, gait problem and neck pain (Fall on 03/15/15 causing pain in the left knee and effusion.).       Poor balance and gait disturbance.  Skin:       Rough area on left cheek  Allergic/Immunologic: Negative.   Neurological: Negative for tremors, seizures, facial asymmetry and weakness.       Memory loss  Hematological: Negative.   Psychiatric/Behavioral: Positive for confusion.       Mild to moderate memory disturbance.     Past Medical History  Diagnosis Date  . ANEMIA-IRON DEFICIENCY 05/27/2007  . ANXIETY 11/14/2007  .  Intermittent high-grade heart block 09/05/2009    s/p pacemaker  . BENIGN PROSTATIC HYPERTROPHY 11/14/2007  . CAROTID ARTERY DISEASE 10/02/2010  . Pacemaker-St. Jude 02/02/2008    St. Jude  . COLONIC POLYPS, HX OF 05/27/2007  . CORONARY ARTERY DISEASE 11/14/2007    prior bypass [id number]; Inferior MI 11/07; Severe 3 vessel disease; occluded SVG to RCA; SVG to OM subtotalled, SVG to diagonal with ostial 70 and 80 mid; EF 45. PCI of SVG to OM. FU myovue showed EF 56 with inferior infarct and very mild peri-infarct ischemia  . DEPRESSION 11/14/2007  . DIABETES MELLITUS, TYPE II 11/14/2007  . DIVERTICULITIS, HX OF 05/27/2007  . DVT (deep venous  thrombosis) 03/21/2011  . FOOT PAIN, BILATERAL 12/08/2010  . GERD 11/14/2007  . HYPERLIPIDEMIA 11/14/2007  . HYPERTENSION 11/14/2007  . HYPOTENSION, ORTHOSTATIC 03/06/2011  . Memory loss 06/01/2008  . OSTEOPOROSIS 06/02/2010  . PERIPHERAL VASCULAR DISEASE 11/14/2007  . RENAL INSUFFICIENCY 11/14/2007  . SECONDARY DM W/PERIPHERAL CIRC D/O UNCONTROLLED 11/07/2010  . SYNCOPE 09/08/2008  . Unspecified hearing loss 05/13/2009  . Prostate cancer   . Myalgia and myositis, unspecified 04/24/2011  . Other malaise and fatigue 04/24/2011  . Peripheral neuropathy 03/27/2011  . Seborrheic keratosis 06/05/2011  . Osteoarthrosis, unspecified whether generalized or localized, unspecified site   . Ischemic cardiomyopathy     a. echo (10/15):  mod focal basal septal hypertrophy, EF 40-45%, inf AK, Gr 1 DD, Al sclerosis, mild AI, MAC, mild MR, mod LAE, lipomatous hypertrophy, small effusion (no hemodynamic compromise)   . Effusion of left knee 03/17/2015   Past Surgical History  Procedure Laterality Date  . S/p coronary stent x 1    . Cholecystectomy    . S/p bilat knee replacement  Cleon Gustin, MD  . Pacemaker insertion  2012    Caryl Comes, MD  . Prostate needle biopsy    . Cystoscopy    . Appendectomy    . Coronary artery bypass graft  2007   Social History:   reports that he quit smoking about 31 years ago. His smoking use included Cigarettes. He has a 50 pack-year smoking history. He has never used smokeless tobacco. He reports that he does not drink alcohol or use illicit drugs.  Family History  Problem Relation Age of Onset  . Diabetes Father     37  . Cancer Mother     Medications: Patient's Medications  New Prescriptions   No medications on file  Previous Medications   ALENDRONATE (FOSAMAX) 70 MG TABLET    TAKE 1 TAB ONCE A WK AT LEAST 30 MIN BEFORE 1ST FOOD.DO NOT LIE DOWN FOR 30 MIN AFTER TAKING.   ATORVASTATIN (LIPITOR) 40 MG TABLET    TAKE 1 TABLET ONCE A DAY TO CONTROL CHOLESTEROL.    DONEPEZIL (ARICEPT) 5 MG TABLET    1 po daily for 1 month then 2 tabs daily   FEEDING SUPPLEMENT, GLUCERNA SHAKE, (GLUCERNA SHAKE) LIQD    Take 237 mLs by mouth 2 (two) times daily between meals.   FENOFIBRATE 160 MG TABLET    TAKE 1 TABLET ONCE A DAY TO CONTROL CHOLESTEROL.   FUROSEMIDE (LASIX) 20 MG TABLET    Take 1 tablet (20 mg total) by mouth every other day.   HYDROCODONE-ACETAMINOPHEN (NORCO/VICODIN) 5-325 MG PER TABLET    Take 1-2 tablets by mouth every 6 (six) hours as needed for moderate pain.   MEMANTINE (NAMENDA XR) 28 MG CP24 24 HR CAPSULE  Take 1 capsule (28 mg total) by mouth daily.   METFORMIN (GLUCOPHAGE) 500 MG TABLET    TAKE 1 TABLET EACH MORNING TO CONTROL GLUCOSE.   NITROGLYCERIN (NITROSTAT) 0.4 MG SL TABLET    Place 0.4 mg under the tongue every 5 (five) minutes as needed for chest pain.   OXYBUTYNIN (DITROPAN) 5 MG TABLET    Take 2.5 mg by mouth 3 (three) times daily.   RIVAROXABAN (XARELTO) 15 MG TABS TABLET    Take 1 tablet (15 mg total) by mouth daily with supper.   SOLIFENACIN (VESICARE) 10 MG TABLET    Take 10 mg by mouth daily. To decrease urinary urgency   TAMSULOSIN (FLOMAX) 0.4 MG CAPS CAPSULE    Take 0.4 mg by mouth daily after breakfast. For bladder  Modified Medications   No medications on file  Discontinued Medications   No medications on file     Physical Exam: Physical Exam  Constitutional: He is oriented to person, place, and time.  Frail  HENT:  Head: Normocephalic and atraumatic.  Nose: Nose normal.  Bilateral hearing aids.  Eyes: Conjunctivae are normal. Pupils are equal, round, and reactive to light.  Prescription lenses.  Neck: Normal range of motion. Neck supple. No JVD present. No tracheal deviation present. No thyromegaly present.  Cardiovascular: Normal rate and regular rhythm.  Exam reveals no gallop and no friction rub.   Murmur heard.  Systolic murmur is present with a grade of 1/6  Absent DP and PT  Pulmonary/Chest: Effort normal  and breath sounds normal. No respiratory distress. He has no wheezes. He has no rales. He exhibits no tenderness.  Using portable oxygen at 2 L/m today.  Abdominal: Soft. Bowel sounds are normal. He exhibits no distension and no mass. There is no tenderness.  Musculoskeletal: Normal range of motion. He exhibits edema. He exhibits no tenderness.  Pain in the left SI joint area. Limps when walking. Favors the left side. Effusion of the left knee. Scars in the left knee from previous surgery. Tender at the left knee with movement of the patella.  Lymphadenopathy:    He has no cervical adenopathy.  Neurological: He is alert and oriented to person, place, and time. He has normal strength. A sensory deficit is present. No cranial nerve deficit. Gait abnormal.  07/13/14 MMSE 28/30. Passed clock drawing. Diminshed sensation to vabration and monofilament.  Skin: Skin is warm and dry. No erythema. No pallor.  Rough area on the left cheek. Cystic lesion in the mid forehead.  Psychiatric: He has a normal mood and affect. His behavior is normal. Thought content normal.  Patient is in denial regarding the extent of his memory deficit.   Filed Vitals:   03/25/15 1707  BP: 133/89  Pulse: 71  Temp: 97.1 F (36.2 C)  TempSrc: Tympanic  Resp: 18      Labs reviewed: Basic Metabolic Panel:  Recent Labs  07/30/14 1434  10/08/14 1603  10/25/14 1107  12/09/14 1124 02/07/15 03/15/15 1039  NA 139  < >  --   < > 139  < > 136 137 140  K 4.4  < >  --   < > 4.5  < > 4.4 4.3 4.3  CL 97  < >  --   < > 102  --  101  --  106  CO2 23  < >  --   < > 26  --  27  --  26  GLUCOSE 130*  < >  --   < >  139*  --  129*  --  132*  BUN 26  < >  --   < > 33*  < > 34* 30* 37*  CREATININE 1.39*  < > 1.55*  < > 1.37*  < > 1.21 1.2 1.30  CALCIUM 9.6  < >  --   < > 9.2  --  9.8  --  9.3  MG  --   --  2.0  --   --   --   --   --   --   TSH 4.270  --  3.830  --   --   --   --   --   --   < > = values in this interval not  displayed. Liver Function Tests:  Recent Labs  07/30/14 1434 10/08/14 1021 02/07/15  AST 22 25 21   ALT 12 11 10   ALKPHOS 41 44 43  BILITOT 0.8 0.9  --   PROT 7.0 7.3  --   ALBUMIN  --  3.6  --    No results for input(s): LIPASE, AMYLASE in the last 8760 hours. No results for input(s): AMMONIA in the last 8760 hours. CBC:  Recent Labs  10/08/14 1603 12/09/14 1140 03/15/15 1039  WBC 5.4 4.8 7.5  HGB 14.9 13.0 13.0  HCT 43.6 39.1 39.9  MCV 89.3 89.9 93.9  PLT 163 152 142*   Lipid Panel:  Recent Labs  11/01/14 02/07/15  CHOL 115 132  HDL 33* 33*  LDLCALC 48 76  TRIG 171* 115    Past Procedures:  08/09/14 CT head wo contrast:  IMPRESSION:  Abnormal CT head (without) demonstrating: 1. Moderate frontal, parietal, temporal atrophy. Mild ventriculomegaly on ex vacuo basis. 2. Mild chronic small vessel ischemic disease. 3. Right parietal convexity encephalomalacia, may represent chronic ischemic infarction. 4. No acute findings.  03/15/15 X-ray R knee  IMPRESSION: Negative for acute fracture or dislocation  03/15/15 X-ray L knee  IMPRESSION: Moderately large joint effusion. Negative for acute fracture or dislocation.  Assessment/Plan Memory loss 03/16/15 SLUMS 20/30. Takes Aricept 10mg  and Namenda 28mg  daily.    Paroxysmal atrial fibrillation Heart rate is in control. Takes Xarelto 15mg  daily.    DM type 2 with diabetic peripheral neuropathy Takes Metformin 500mg     Osteoporosis Takes Fosamax   Chronic combined systolic and diastolic CHF (congestive heart failure) Clinically compensated. Takes Furosemide 20mg    Enlarged prostate without urinary obstruction No urinary retention. Takes Tamsulosin 0.4mg , Vesicare 10mg , and Oxybutynin 2.5mg  tid.    Essential hypertension Blood pressure is controlled. Takes Losartan 25mg     CKD stage 3 due to type 1 diabetes mellitus Bun/creat 30/1.2 01/2014   CAD (coronary artery disease) No angina since  admitted to SNF. Prn NTG, statin, and Xarelto   Effusion of left knee F/u Ortho.      Family/ Staff Communication: observe the patient.   Goals of Care: SNF  Labs/tests ordered: none

## 2015-03-26 ENCOUNTER — Encounter: Payer: Self-pay | Admitting: Internal Medicine

## 2015-03-31 ENCOUNTER — Non-Acute Institutional Stay (SKILLED_NURSING_FACILITY): Payer: Medicare Other | Admitting: Internal Medicine

## 2015-03-31 DIAGNOSIS — I10 Essential (primary) hypertension: Secondary | ICD-10-CM

## 2015-03-31 DIAGNOSIS — M25462 Effusion, left knee: Secondary | ICD-10-CM

## 2015-03-31 DIAGNOSIS — R531 Weakness: Secondary | ICD-10-CM | POA: Diagnosis not present

## 2015-04-01 ENCOUNTER — Encounter: Payer: Self-pay | Admitting: Nurse Practitioner

## 2015-04-01 NOTE — Assessment & Plan Note (Signed)
F/u Ortho.  

## 2015-04-01 NOTE — Assessment & Plan Note (Signed)
Clinically compensated. Takes Furosemide 20mg 

## 2015-04-01 NOTE — Assessment & Plan Note (Signed)
Takes Metformin 500mg 

## 2015-04-01 NOTE — Assessment & Plan Note (Signed)
03/16/15 SLUMS 20/30. Takes Aricept 10mg  and Namenda 28mg  daily.

## 2015-04-01 NOTE — Assessment & Plan Note (Signed)
No urinary retention. Takes Tamsulosin 0.4mg , Vesicare 10mg , and Oxybutynin 2.5mg  tid.

## 2015-04-01 NOTE — Assessment & Plan Note (Signed)
Bun/creat 30/1.2 01/2014

## 2015-04-01 NOTE — Assessment & Plan Note (Signed)
No angina since admitted to SNF. Prn NTG, statin, and Xarelto

## 2015-04-01 NOTE — Assessment & Plan Note (Signed)
Heart rate is in control. Takes Xarelto 15mg  daily.

## 2015-04-01 NOTE — Assessment & Plan Note (Signed)
Blood pressure is controlled. Takes Losartan 25mg 

## 2015-04-01 NOTE — Assessment & Plan Note (Signed)
Takes Fosamax

## 2015-04-04 ENCOUNTER — Encounter: Payer: Self-pay | Admitting: Internal Medicine

## 2015-04-04 DIAGNOSIS — R531 Weakness: Secondary | ICD-10-CM

## 2015-04-04 HISTORY — DX: Weakness: R53.1

## 2015-04-04 NOTE — Progress Notes (Signed)
Patient ID: Zachary Harris, male   DOB: Feb 15, 1923, 81 y.o.   MRN: [id number]    [place] Room Number: X32  Place of Service: SNF (31)     No Known Allergies  Chief Complaint  Patient presents with   Medical Management of Chronic Issues    HPI:  Patient was seen acutely 4/716 at the request of his daughter. She was concerned about whether he might of had a stroke. At the time I saw him following his admission to the skilled nursing facility, I did not feel that there are focal neurologic findings. Although he was weaker around the left knee, that was due to traumatic injury of the knee. Patient was playing Scrabble when I entered the room. He was able to utilize his letters and make words well.  He has been participating with physical therapy and occupational therapy. He is able to stand alone and uses walker effectively and safely. The knee effusion has resolved. I still don't find any focal neurologic findings. He does not have hemiparesis. His daughter was worried that the entire left side was weaker than the right. There are no visual disturbances or speech deficits that are new.  Medications: Patient's Medications  New Prescriptions   No medications on file  Previous Medications   ALENDRONATE (FOSAMAX) 70 MG TABLET    TAKE 1 TAB ONCE A WK AT LEAST 30 MIN BEFORE 1ST FOOD.DO NOT LIE DOWN FOR 30 MIN AFTER TAKING.   ATORVASTATIN (LIPITOR) 40 MG TABLET    TAKE 1 TABLET ONCE A DAY TO CONTROL CHOLESTEROL.   DONEPEZIL (ARICEPT) 5 MG TABLET    1 po daily for 1 month then 2 tabs daily   FEEDING SUPPLEMENT, GLUCERNA SHAKE, (GLUCERNA SHAKE) LIQD    Take 237 mLs by mouth 2 (two) times daily between meals.   FENOFIBRATE 160 MG TABLET    TAKE 1 TABLET ONCE A DAY TO CONTROL CHOLESTEROL.   FUROSEMIDE (LASIX) 20 MG TABLET    Take 1 tablet (20 mg total) by mouth every other day.   HYDROCODONE-ACETAMINOPHEN (NORCO/VICODIN) 5-325 MG PER TABLET    Take 1-2 tablets by  mouth every 6 (six) hours as needed for moderate pain.   MEMANTINE (NAMENDA XR) 28 MG CP24 24 HR CAPSULE    Take 1 capsule (28 mg total) by mouth daily.   METFORMIN (GLUCOPHAGE) 500 MG TABLET    TAKE 1 TABLET EACH MORNING TO CONTROL GLUCOSE.   NITROGLYCERIN (NITROSTAT) 0.4 MG SL TABLET    Place 0.4 mg under the tongue every 5 (five) minutes as needed for chest pain.   OXYBUTYNIN (DITROPAN) 5 MG TABLET    Take 2.5 mg by mouth 3 (three) times daily.   RIVAROXABAN (XARELTO) 15 MG TABS TABLET    Take 1 tablet (15 mg total) by mouth daily with supper.   SOLIFENACIN (VESICARE) 10 MG TABLET    Take 10 mg by mouth daily. To decrease urinary urgency   TAMSULOSIN (FLOMAX) 0.4 MG CAPS CAPSULE    Take 0.4 mg by mouth daily after breakfast. For bladder  Modified Medications   No medications on file  Discontinued Medications   No medications on file     Review of Systems  Constitutional: Negative for fever, chills, activity change, appetite change, fatigue and unexpected weight change.  HENT: Positive for hearing loss. Negative for ear pain and sinus pressure.   Eyes: Negative for photophobia, pain and visual disturbance.  Corrective lenses  °Respiratory: Positive for shortness of breath. Negative for cough, chest tightness and wheezing.   °Cardiovascular: Positive for leg swelling.  °     Pacemaker. History of syncope related to cardiac arrhythmia.  °Gastrointestinal: Negative for abdominal pain and abdominal distention.  °Endocrine: Negative for cold intolerance, heat intolerance, polydipsia, polyphagia and polyuria.  °     History of diabetes.  °Genitourinary: Positive for urgency and frequency. Negative for dysuria and flank pain.  °     Urge incontinence. Increased frequency which has not responded well to [place]. Continues to see urologist. Now taking oxybutynin 3 times a day. Episodes of incontinence persist.  °Musculoskeletal: Positive for myalgias, back pain, arthralgias, gait problem and neck  pain (Fall on 03/15/15 causing pain in the left knee and effusion.).  °     Poor balance and gait disturbance.  °Skin:  °     Rough area on left cheek  °Allergic/Immunologic: Negative.   °Neurological: Negative for tremors, seizures, facial asymmetry and weakness.  °     Memory loss  °Hematological: Negative.   °Psychiatric/Behavioral: Positive for confusion.  °     Mild to moderate memory disturbance.  ° ° °Filed Vitals:  ° 04/04/15 1414  °BP: 120/56  °Pulse: 60  °Temp: 98.5 °F (36.9 °C)  °Resp: 18  °Height: 5' 9" (1.753 m)  °Weight: 178 lb 4.8 oz (80.876 kg)  °SpO2: 98%  ° °Body mass index is 26.32 kg/(m^2). ° °Physical Exam  °Constitutional: He is oriented to person, place, and time.  °Frail  °HENT:  °Head: Normocephalic and atraumatic.  °Nose: Nose normal.  °Bilateral hearing aids.  °Eyes: Conjunctivae are normal. Pupils are equal, round, and reactive to light.  °Prescription lenses.  °Neck: Normal range of motion. Neck supple. No JVD present. No tracheal deviation present. No thyromegaly present.  °Cardiovascular: Normal rate and regular rhythm.  Exam reveals no gallop and no friction rub.   °Murmur heard. ° Systolic murmur is present with a grade of 1/6  °Absent DP and PT  °Pulmonary/Chest: Effort normal and breath sounds normal. No respiratory distress. He has no wheezes. He has no rales. He exhibits no tenderness.  °Using portable oxygen at 2 L/m today.  °Abdominal: Soft. Bowel sounds are normal. He exhibits no distension and no mass. There is no tenderness.  °Musculoskeletal: Normal range of motion. He exhibits edema. He exhibits no tenderness.  °Pain in the left SI joint area. Limps when walking. Favors the left side. Effusion of the left knee has improved. Scars in the left knee from previous surgery. Tender at the left knee with movement of the patella.  °Lymphadenopathy:  °  He has no cervical adenopathy.  °Neurological: He is alert and oriented to person, place, and time. He has normal strength. A  sensory deficit is present. No cranial nerve deficit. Gait abnormal.  °07/13/14 MMSE 28/30. Passed clock drawing. °Diminshed sensation to vabration and monofilament.  °Skin: Skin is warm and dry. No erythema. No pallor.  °Rough area on the left cheek. Cystic lesion in the mid forehead.  °Psychiatric: He has a normal mood and affect. His behavior is normal. Thought content normal.  °Patient is in denial regarding the extent of his memory deficit.  ° ° ° °Labs reviewed: °Admission on 03/15/2015, Discharged on 03/15/2015  °Component Date Value Ref Range Status  °• WBC 03/15/2015 7.5  4.0 - 10.5 K/uL Final  °• RBC 03/15/2015 4.25  4.22 - 5.81 MIL/uL Final  °• Hemoglobin   03/15/2015 13.0  13.0 - 17.0 g/dL Final  °• HCT 03/15/2015 39.9  39.0 - 52.0 % Final  °• MCV 03/15/2015 93.9  78.0 - 100.0 fL Final  °• MCH 03/15/2015 30.6  26.0 - 34.0 pg Final  °• MCHC 03/15/2015 32.6  30.0 - 36.0 g/dL Final  °• RDW 03/15/2015 13.5  11.5 - 15.5 % Final  °• Platelets 03/15/2015 142* 150 - 400 K/uL Final  °• Sodium 03/15/2015 140  135 - 145 mmol/L Final  °• Potassium 03/15/2015 4.3  3.5 - 5.1 mmol/L Final  °• Chloride 03/15/2015 106  96 - 112 mmol/L Final  °• CO2 03/15/2015 26  19 - 32 mmol/L Final  °• Glucose, Bld 03/15/2015 132* 70 - 99 mg/dL Final  °• BUN 03/15/2015 37* 6 - 23 mg/dL Final  °• Creatinine, Ser 03/15/2015 1.30  0.50 - 1.35 mg/dL Final  °• Calcium 03/15/2015 9.3  8.4 - 10.5 mg/dL Final  °• GFR calc non Af Amer 03/15/2015 47* >90 mL/min Final  °• GFR calc Af Amer 03/15/2015 54* >90 mL/min Final  ° Comment: (NOTE) °The eGFR has been calculated using the CKD EPI equation. °This calculation has not been validated in all clinical situations. °eGFR's persistently <90 mL/min signify possible Chronic Kidney °Disease. °  °• Anion gap 03/15/2015 8  5 - 15 Final  °• B Natriuretic Peptide 03/15/2015 213.8* 0.0 - 100.0 pg/mL Final  °• Troponin I 03/15/2015 <0.03  <0.031 ng/mL Final  ° Comment:        °NO INDICATION OF °MYOCARDIAL  INJURY. °  °Abstract on 02/11/2015  °Component Date Value Ref Range Status  °• Glucose 02/07/2015 119   Final  °• BUN 02/07/2015 30* 4 - 21 mg/dL Final  °• Creatinine 02/07/2015 1.2  0.6 - 1.3 mg/dL Final  °• Potassium 02/07/2015 4.3  3.4 - 5.3 mmol/L Final  °• Sodium 02/07/2015 137  137 - 147 mmol/L Final  °• Triglycerides 02/07/2015 115  40 - 160 mg/dL Final  °• Cholesterol 02/07/2015 132  0 - 200 mg/dL Final  °• HDL 02/07/2015 33* 35 - 70 mg/dL Final  °• LDL Cholesterol 02/07/2015 76   Final  °• Alkaline Phosphatase 02/07/2015 43  25 - 125 U/L Final  °• ALT 02/07/2015 10  10 - 40 U/L Final  °• AST 02/07/2015 21  14 - 40 U/L Final  °• Bilirubin, Total 02/07/2015 0.5   Final  °• Hgb A1c MFr Bld 02/07/2015 6.6* 4.0 - 6.0 % Final  °Clinical Support on 01/17/2015  °Component Date Value Ref Range Status  °• Date Time Interrogation Session 01/20/2015 [id number]   Final  °• Implantable Pulse Generator Manufa* 01/20/2015 [employer]   Final  °• Implantable Pulse Generator Model 01/20/2015 [address] RF   Final  °• Implantable Pulse Generator Serial* 01/20/2015 [id number]   Final  °• Lead Channel Setting Sensing Sensi* 01/20/2015 2   Final  °• Lead Channel Setting Sensing Adapt* 01/20/2015 Fixed Pacing   Final  °• Lead Channel Setting Pacing Amplit* 01/20/2015 2   Final  °• Lead Channel Setting Pacing Pulse * 01/20/2015 0.4   Final  °• Lead Channel Setting Pacing Amplit* 01/20/2015 2.5   Final  °• Lead Channel Status 01/20/2015    Final  °• Lead Channel Impedance Value 01/20/2015 410   Final  °• Lead Channel Sensing Intrinsic Amp* 01/20/2015 3   Final  °• Lead Channel Status 01/20/2015    Final  °• Lead Channel Impedance Value 01/20/2015   540   Final   Lead Channel Sensing Intrinsic Amp* 01/20/2015 12   Final   Battery Status 01/20/2015 MOS   Final   Battery Remaining Longevity 01/20/2015 93   Final   Battery Remaining Percentage 01/20/2015 71   Final   Battery Voltage 01/20/2015 2.95   Final    Brady Statistic RA Percent Paced 01/20/2015 2.1   Final   Brady Statistic RV Percent Paced 01/20/2015 28   Final   Brady Statistic AP VP Percent 01/20/2015 1   Final   Brady Statistic AS VP Percent 01/20/2015 28   Final   Brady Statistic AP VS Percent 01/20/2015 1.9   Final   Loletha Grayer Statistic AS VS Percent 01/20/2015 69   Final   Eval Rhythm 01/20/2015 SR   Final   Miscellaneous Comment 01/20/2015    Final                   Value:Pacemaker remote check. Device function reviewed. Impedance, sensing, auto capture thresholds consistent with previous measurements. Histograms appropriate for patient and level of activity. All other diagnostic data reviewed and is appropriate and  stable for patient. Real time/magnet EGM shows appropriate sensing and capture. 4 mode switches-- <1% of time. Longest episode was 20 seconds. + Coumadin. No ventricular high rate episodes. Estimated longevity 7.4 to 8.1 years. Merlin 04-19-15 and ROV in  October with SK.      Assessment/Plan  1. Weak Gaining strength  2. Essential hypertension Controlled  3. Effusion of left knee Improved   Current plan is for the patient to go back to his independent living apartment 04/07/2015. He will have assistance from time to time there.

## 2015-04-11 DIAGNOSIS — I5020 Unspecified systolic (congestive) heart failure: Secondary | ICD-10-CM | POA: Diagnosis not present

## 2015-04-13 DIAGNOSIS — R41841 Cognitive communication deficit: Secondary | ICD-10-CM | POA: Diagnosis not present

## 2015-04-13 DIAGNOSIS — R262 Difficulty in walking, not elsewhere classified: Secondary | ICD-10-CM | POA: Diagnosis not present

## 2015-04-13 DIAGNOSIS — R1313 Dysphagia, pharyngeal phase: Secondary | ICD-10-CM | POA: Diagnosis not present

## 2015-04-13 DIAGNOSIS — M6281 Muscle weakness (generalized): Secondary | ICD-10-CM | POA: Diagnosis not present

## 2015-04-18 DIAGNOSIS — M6281 Muscle weakness (generalized): Secondary | ICD-10-CM | POA: Diagnosis not present

## 2015-04-18 DIAGNOSIS — R41841 Cognitive communication deficit: Secondary | ICD-10-CM | POA: Diagnosis not present

## 2015-04-18 DIAGNOSIS — R1313 Dysphagia, pharyngeal phase: Secondary | ICD-10-CM | POA: Diagnosis not present

## 2015-04-18 DIAGNOSIS — R262 Difficulty in walking, not elsewhere classified: Secondary | ICD-10-CM | POA: Diagnosis not present

## 2015-04-19 ENCOUNTER — Ambulatory Visit (INDEPENDENT_AMBULATORY_CARE_PROVIDER_SITE_OTHER): Payer: Medicare Other | Admitting: *Deleted

## 2015-04-19 ENCOUNTER — Encounter: Payer: Self-pay | Admitting: Internal Medicine

## 2015-04-19 DIAGNOSIS — R41841 Cognitive communication deficit: Secondary | ICD-10-CM | POA: Diagnosis not present

## 2015-04-19 DIAGNOSIS — R262 Difficulty in walking, not elsewhere classified: Secondary | ICD-10-CM | POA: Diagnosis not present

## 2015-04-19 DIAGNOSIS — M6281 Muscle weakness (generalized): Secondary | ICD-10-CM | POA: Diagnosis not present

## 2015-04-19 DIAGNOSIS — R1313 Dysphagia, pharyngeal phase: Secondary | ICD-10-CM | POA: Diagnosis not present

## 2015-04-19 DIAGNOSIS — I442 Atrioventricular block, complete: Secondary | ICD-10-CM

## 2015-04-19 LAB — MDC_IDC_ENUM_SESS_TYPE_REMOTE
Battery Voltage: 2.96 V
Brady Statistic AS VS Percent: 64 %
Brady Statistic RA Percent Paced: 3 %
Implantable Pulse Generator Serial Number: 7228315
Lead Channel Impedance Value: 530 Ohm
Lead Channel Pacing Threshold Amplitude: 0.75 V
Lead Channel Pacing Threshold Pulse Width: 0.4 ms
Lead Channel Sensing Intrinsic Amplitude: 12 mV
Lead Channel Sensing Intrinsic Amplitude: 2.6 mV
Lead Channel Setting Pacing Amplitude: 2 V
Lead Channel Setting Pacing Amplitude: 2.5 V
Lead Channel Setting Pacing Pulse Width: 0.4 ms
MDC IDC MSMT BATTERY REMAINING LONGEVITY: 106 mo
MDC IDC MSMT BATTERY REMAINING PERCENTAGE: 82 %
MDC IDC MSMT LEADCHNL RA IMPEDANCE VALUE: 400 Ohm
MDC IDC MSMT LEADCHNL RV PACING THRESHOLD AMPLITUDE: 0.75 V
MDC IDC MSMT LEADCHNL RV PACING THRESHOLD PULSEWIDTH: 0.4 ms
MDC IDC SESS DTM: 20160426060014
MDC IDC SET LEADCHNL RV SENSING SENSITIVITY: 2 mV
MDC IDC STAT BRADY AP VP PERCENT: 1.4 %
MDC IDC STAT BRADY AP VS PERCENT: 2.3 %
MDC IDC STAT BRADY AS VP PERCENT: 31 %
MDC IDC STAT BRADY RV PERCENT PACED: 33 %

## 2015-04-19 NOTE — Progress Notes (Signed)
Remote pacemaker transmission.   

## 2015-04-20 DIAGNOSIS — M6281 Muscle weakness (generalized): Secondary | ICD-10-CM | POA: Diagnosis not present

## 2015-04-20 DIAGNOSIS — R262 Difficulty in walking, not elsewhere classified: Secondary | ICD-10-CM | POA: Diagnosis not present

## 2015-04-20 DIAGNOSIS — R1313 Dysphagia, pharyngeal phase: Secondary | ICD-10-CM | POA: Diagnosis not present

## 2015-04-20 DIAGNOSIS — R41841 Cognitive communication deficit: Secondary | ICD-10-CM | POA: Diagnosis not present

## 2015-04-22 ENCOUNTER — Encounter: Payer: Self-pay | Admitting: Pulmonary Disease

## 2015-04-22 DIAGNOSIS — R262 Difficulty in walking, not elsewhere classified: Secondary | ICD-10-CM | POA: Diagnosis not present

## 2015-04-22 DIAGNOSIS — R1313 Dysphagia, pharyngeal phase: Secondary | ICD-10-CM | POA: Diagnosis not present

## 2015-04-22 DIAGNOSIS — M6281 Muscle weakness (generalized): Secondary | ICD-10-CM | POA: Diagnosis not present

## 2015-04-22 DIAGNOSIS — R41841 Cognitive communication deficit: Secondary | ICD-10-CM | POA: Diagnosis not present

## 2015-04-22 NOTE — Telephone Encounter (Signed)
Dr Elsworth Soho, Please see e-mail sent by patient daughter regarding O2

## 2015-04-24 NOTE — Telephone Encounter (Signed)
Liquid oxygen ok Portable concentrator also may work well

## 2015-04-25 ENCOUNTER — Emergency Department (HOSPITAL_COMMUNITY)
Admission: EM | Admit: 2015-04-25 | Discharge: 2015-04-25 | Disposition: A | Discharge status: Home / Self Care | Payer: Medicare Other | Attending: Emergency Medicine | Admitting: Emergency Medicine

## 2015-04-25 ENCOUNTER — Encounter (HOSPITAL_COMMUNITY): Payer: Self-pay | Admitting: Emergency Medicine

## 2015-04-25 ENCOUNTER — Emergency Department (HOSPITAL_COMMUNITY): Payer: Medicare Other

## 2015-04-25 ENCOUNTER — Telehealth: Payer: Self-pay

## 2015-04-25 DIAGNOSIS — Z79899 Other long term (current) drug therapy: Secondary | ICD-10-CM | POA: Insufficient documentation

## 2015-04-25 DIAGNOSIS — Z862 Personal history of diseases of the blood and blood-forming organs and certain disorders involving the immune mechanism: Secondary | ICD-10-CM | POA: Diagnosis not present

## 2015-04-25 DIAGNOSIS — Z86718 Personal history of other venous thrombosis and embolism: Secondary | ICD-10-CM | POA: Diagnosis not present

## 2015-04-25 DIAGNOSIS — Z950 Presence of cardiac pacemaker: Secondary | ICD-10-CM | POA: Insufficient documentation

## 2015-04-25 DIAGNOSIS — E785 Hyperlipidemia, unspecified: Secondary | ICD-10-CM | POA: Diagnosis not present

## 2015-04-25 DIAGNOSIS — N400 Enlarged prostate without lower urinary tract symptoms: Secondary | ICD-10-CM | POA: Insufficient documentation

## 2015-04-25 DIAGNOSIS — Z951 Presence of aortocoronary bypass graft: Secondary | ICD-10-CM | POA: Diagnosis not present

## 2015-04-25 DIAGNOSIS — Z87891 Personal history of nicotine dependence: Secondary | ICD-10-CM | POA: Insufficient documentation

## 2015-04-25 DIAGNOSIS — Z9861 Coronary angioplasty status: Secondary | ICD-10-CM | POA: Diagnosis not present

## 2015-04-25 DIAGNOSIS — Z8546 Personal history of malignant neoplasm of prostate: Secondary | ICD-10-CM | POA: Insufficient documentation

## 2015-04-25 DIAGNOSIS — S22000A Wedge compression fracture of unspecified thoracic vertebra, initial encounter for closed fracture: Secondary | ICD-10-CM

## 2015-04-25 DIAGNOSIS — Z86010 Personal history of colonic polyps: Secondary | ICD-10-CM | POA: Diagnosis not present

## 2015-04-25 DIAGNOSIS — Z87448 Personal history of other diseases of urinary system: Secondary | ICD-10-CM | POA: Insufficient documentation

## 2015-04-25 DIAGNOSIS — Z7901 Long term (current) use of anticoagulants: Secondary | ICD-10-CM | POA: Diagnosis not present

## 2015-04-25 DIAGNOSIS — I10 Essential (primary) hypertension: Secondary | ICD-10-CM | POA: Insufficient documentation

## 2015-04-25 DIAGNOSIS — Z8669 Personal history of other diseases of the nervous system and sense organs: Secondary | ICD-10-CM | POA: Insufficient documentation

## 2015-04-25 DIAGNOSIS — G8929 Other chronic pain: Secondary | ICD-10-CM | POA: Insufficient documentation

## 2015-04-25 DIAGNOSIS — M549 Dorsalgia, unspecified: Secondary | ICD-10-CM

## 2015-04-25 DIAGNOSIS — M47816 Spondylosis without myelopathy or radiculopathy, lumbar region: Secondary | ICD-10-CM | POA: Diagnosis not present

## 2015-04-25 DIAGNOSIS — H9190 Unspecified hearing loss, unspecified ear: Secondary | ICD-10-CM | POA: Insufficient documentation

## 2015-04-25 DIAGNOSIS — E1351 Other specified diabetes mellitus with diabetic peripheral angiopathy without gangrene: Secondary | ICD-10-CM | POA: Insufficient documentation

## 2015-04-25 DIAGNOSIS — I2510 Atherosclerotic heart disease of native coronary artery without angina pectoris: Secondary | ICD-10-CM | POA: Diagnosis not present

## 2015-04-25 DIAGNOSIS — M8008XA Age-related osteoporosis with current pathological fracture, vertebra(e), initial encounter for fracture: Secondary | ICD-10-CM | POA: Diagnosis not present

## 2015-04-25 DIAGNOSIS — M545 Low back pain: Secondary | ICD-10-CM | POA: Diagnosis not present

## 2015-04-25 DIAGNOSIS — Z8719 Personal history of other diseases of the digestive system: Secondary | ICD-10-CM | POA: Insufficient documentation

## 2015-04-25 DIAGNOSIS — Z872 Personal history of diseases of the skin and subcutaneous tissue: Secondary | ICD-10-CM | POA: Insufficient documentation

## 2015-04-25 DIAGNOSIS — R52 Pain, unspecified: Secondary | ICD-10-CM | POA: Diagnosis not present

## 2015-04-25 DIAGNOSIS — S22080A Wedge compression fracture of T11-T12 vertebra, initial encounter for closed fracture: Secondary | ICD-10-CM | POA: Diagnosis not present

## 2015-04-25 DIAGNOSIS — Z8659 Personal history of other mental and behavioral disorders: Secondary | ICD-10-CM | POA: Diagnosis not present

## 2015-04-25 MED ORDER — DOCUSATE SODIUM 100 MG PO CAPS: 100.0000 mg | ORAL_CAPSULE | Freq: Two times a day (BID) | ORAL | Status: DC

## 2015-04-25 MED ORDER — OXYCODONE-ACETAMINOPHEN 5-325 MG PO TABS
1.0000 | ORAL_TABLET | Freq: Once | ORAL | Status: AC
  Administered 2015-04-25: 1 via ORAL
  Filled 2015-04-25: qty 1

## 2015-04-25 MED ORDER — HYDROCODONE-ACETAMINOPHEN 5-325 MG PO TABS: 1.0000 | ORAL_TABLET | ORAL | Status: DC | PRN

## 2015-04-25 NOTE — Telephone Encounter (Signed)
Patients daughter called indicating patient with back pain since Saturday (no known injury) and not eating. Patient needs appointment to see Dr.Green today or tomorrow. Patient is a resident at [facility]. Scheduled appointment for Tuesday at 8:30 am.

## 2015-04-25 NOTE — ED Notes (Addendum)
Per ems pt from [facility], co chronic back pain. Home meds are no relief. Pt is normally ambulatory with walker assistance. Pt is alert and oriented x4. Pt on home O2

## 2015-04-25 NOTE — Progress Notes (Signed)
CSW met with pt at bedside. Caregiver was present. Patient confirms that he comes from [facility] and states that he has been living there for the past 10 years. Patient is retired. Patient informed CSW that he is in the independent living unit of the facility and that he can complete his ADL's independently. He states that he is not interested in an upgraded level of care at this time. Also, he confirms that he presents to [facility] due to chronic back pain. According to caregiver and pt, the back pain started over the weekend.   Caregiver states that she is with the pt for 10 hours a day 6 days a week. Also, she states that the pt's son often relieves her during the night to stay with the pt at the facility. According to caregiver, the son lives in [place].   Patient expressed that his primary concern at this time is his back pain.   Patient informed CSW that he is a past smoker. He states that he quit 20 years ago.   Patient has a good support system at this time, which includes his children. Caregiver states that the pt's daughter who lives in [place] is his medical POA. She states that the daughter is really involved with pt and will be visiting the pt sometime this week.   Carla/ Daughter [phone]   , LCSWA [phone] ED CSW 04/25/2015 4:48 PM    

## 2015-04-25 NOTE — ED Provider Notes (Signed)
CSN: [id number]     Arrival date & time 04/25/15  1428 History   First MD Initiated Contact with Patient 04/25/15 1505     Chief Complaint  Patient presents with  . Back Pain    lower      HPI Patient presents emergency department complaining of acute exacerbation of what sounds like chronic low back pain.  He's tried home medications without improvement in his symptoms.  He reports no new weakness or numbness in his legs.  No recent injury or trauma.  Feels as though he might need to have a bowel movement has not had a bowel movement in several days.  He tried a stool softener today without improvement.  No fevers or chills.  Does have a remote history of prostate cancer status post treatment.   Past Medical History  Diagnosis Date  . ANEMIA-IRON DEFICIENCY 05/27/2007  . ANXIETY 11/14/2007  . Intermittent high-grade heart block 09/05/2009    s/p pacemaker  . BENIGN PROSTATIC HYPERTROPHY 11/14/2007  . CAROTID ARTERY DISEASE 10/02/2010  . Pacemaker-St. Jude 02/02/2008    St. Jude  . COLONIC POLYPS, HX OF 05/27/2007  . CORONARY ARTERY DISEASE 11/14/2007    prior bypass [phone]; Inferior MI 11/07; Severe 3 vessel disease; occluded SVG to RCA; SVG to OM subtotalled, SVG to diagonal with ostial 70 and 80 mid; EF 45. PCI of SVG to OM. FU myovue showed EF 56 with inferior infarct and very mild peri-infarct ischemia  . DEPRESSION 11/14/2007  . DIABETES MELLITUS, TYPE II 11/14/2007  . DIVERTICULITIS, HX OF 05/27/2007  . DVT (deep venous thrombosis) 03/21/2011  . FOOT PAIN, BILATERAL 12/08/2010  . GERD 11/14/2007  . HYPERLIPIDEMIA 11/14/2007  . HYPERTENSION 11/14/2007  . HYPOTENSION, ORTHOSTATIC 03/06/2011  . Memory loss 06/01/2008  . OSTEOPOROSIS 06/02/2010  . PERIPHERAL VASCULAR DISEASE 11/14/2007  . RENAL INSUFFICIENCY 11/14/2007  . SECONDARY DM W/PERIPHERAL CIRC D/O UNCONTROLLED 11/07/2010  . SYNCOPE 09/08/2008  . Unspecified hearing loss 05/13/2009  . Prostate cancer   . Myalgia and  myositis, unspecified 04/24/2011  . Other malaise and fatigue 04/24/2011  . Peripheral neuropathy 03/27/2011  . Seborrheic keratosis 06/05/2011  . Osteoarthrosis, unspecified whether generalized or localized, unspecified site   . Ischemic cardiomyopathy     a. echo (10/15):  mod focal basal septal hypertrophy, EF 40-45%, inf AK, Gr 1 DD, Al sclerosis, mild AI, MAC, mild MR, mod LAE, lipomatous hypertrophy, small effusion (no hemodynamic compromise)   . Effusion of left knee 03/17/2015   Past Surgical History  Procedure Laterality Date  . S/p coronary stent x 1    . Cholecystectomy    . S/p bilat knee replacement  Cleon Gustin, MD  . Pacemaker insertion  2012    Caryl Comes, MD  . Prostate needle biopsy    . Cystoscopy    . Appendectomy    . Coronary artery bypass graft  2007   Family History  Problem Relation Age of Onset  . Diabetes Father     43  . Cancer Mother    History  Substance Use Topics  . Smoking status: Former Smoker -- 1.00 packs/day for 50 years    Types: Cigarettes    Quit date: 10/18/1983  . Smokeless tobacco: Never Used  . Alcohol Use: No    Review of Systems  All other systems reviewed and are negative.     Allergies  Review of patient's allergies indicates no known allergies.  Home Medications   Prior to Admission  medications   Medication Sig Start Date End Date Taking? Authorizing Provider  alendronate (FOSAMAX) 70 MG tablet TAKE 1 TAB ONCE A WK AT LEAST 30 MIN BEFORE 1ST FOOD.DO NOT LIE DOWN FOR 30 MIN AFTER TAKING. Patient taking differently: TAKE 1 TABLET ONCE WEEKLY 30 MINUTES PRIOR TO 1ST MEAL OF THE DAY. DO NOT LIE DOWN FOR 30 MINUTES AFTER TAKING. 10/25/14  Yes Estill Dooms, MD  atorvastatin (LIPITOR) 40 MG tablet TAKE 1 TABLET ONCE A DAY TO CONTROL CHOLESTEROL. 12/30/14  Yes Tiffany L Reed, DO  donepezil (ARICEPT) 5 MG tablet 1 po daily for 1 month then 2 tabs daily Patient taking differently: Take 10 mg by mouth at bedtime.  02/14/15  Yes Otilio Jefferson, NP  feeding supplement, GLUCERNA SHAKE, (GLUCERNA SHAKE) LIQD Take 237 mLs by mouth 2 (two) times daily between meals. 10/10/14  Yes Scott T Weaver, PA-C  fenofibrate 160 MG tablet TAKE 1 TABLET ONCE A DAY TO CONTROL CHOLESTEROL. 03/10/15  Yes Estill Dooms, MD  furosemide (LASIX) 20 MG tablet Take 1 tablet (20 mg total) by mouth every other day. 10/10/14  Yes Liliane Shi, PA-C  losartan (COZAAR) 25 MG tablet Take 25 mg by mouth daily.   Yes Historical Provider, MD  memantine (NAMENDA XR) 28 MG CP24 24 hr capsule Take 1 capsule (28 mg total) by mouth daily. 02/14/15  Yes Otilio Jefferson, NP  metFORMIN (GLUCOPHAGE) 500 MG tablet TAKE 1 TABLET EACH MORNING TO CONTROL GLUCOSE. 02/18/15  Yes Estill Dooms, MD  nitroGLYCERIN (NITROSTAT) 0.4 MG SL tablet Place 0.4 mg under the tongue every 5 (five) minutes as needed for chest pain.   Yes Historical Provider, MD  Rivaroxaban (XARELTO) 15 MG TABS tablet Take 1 tablet (15 mg total) by mouth daily with supper. 10/27/14  Yes Lelon Perla, MD  solifenacin (VESICARE) 10 MG tablet Take 10 mg by mouth daily.   Yes Historical Provider, MD  tamsulosin (FLOMAX) 0.4 MG CAPS capsule Take 0.4 mg by mouth daily after breakfast. For bladder 07/14/13  Yes Historical Provider, MD  docusate sodium (COLACE) 100 MG capsule Take 1 capsule (100 mg total) by mouth every 12 (twelve) hours. 04/25/15   Jola Schmidt, MD  HYDROcodone-acetaminophen (NORCO/VICODIN) 5-325 MG per tablet Take 1 tablet by mouth every 4 (four) hours as needed for moderate pain. 04/25/15   Jola Schmidt, MD   BP 121/59 mmHg  Pulse 82  Resp 18  SpO2 97% Physical Exam  Constitutional: He is oriented to person, place, and time. He appears well-developed and well-nourished.  HENT:  Head: Normocephalic and atraumatic.  Eyes: EOM are normal.  Neck: Normal range of motion.  Cardiovascular: Normal rate, regular rhythm, normal heart sounds and intact distal pulses.   Pulmonary/Chest: Effort normal and  breath sounds normal. No respiratory distress.  Abdominal: Soft. He exhibits no distension. There is no tenderness.  Genitourinary:  Rectal examination without significant stool burden.  No gross blood.  Musculoskeletal: Normal range of motion.  Mild lumbar tenderness without lumbar step-off.  No weakness of arms or legs.  Normal strength bilaterally.  Neurological: He is alert and oriented to person, place, and time.  Skin: Skin is warm and dry.  Psychiatric: He has a normal mood and affect. Judgment normal.  Nursing note and vitals reviewed.   ED Course  Procedures (including critical care time) Labs Review Labs Reviewed - No data to display  Imaging Review Dg Lumbar Spine Complete  04/25/2015  CLINICAL DATA:  Low back pain for several days without acute injury  EXAM: LUMBAR SPINE - COMPLETE 4+ VIEW  COMPARISON:  10/14/2013  FINDINGS: There is progressive compression deformity at T12. Approximately 50% loss vertebral body height on today's exam compared to 30% on prior. There is endplate sclerosis at W4-Y6 L3-L4 and L4-L5 with associated osteophytosis. No subluxation.  IMPRESSION: 1. No acute fracture. 2. Progressive compression fracture at T12 compared to 10/14/2013 with continued loss of vertebral body height now with approximately 50% vertebral body height. 3. Multilevel disc osteophytic disease. 4. Atherosclerotic calcification aorta.   Electronically Signed   By: Suzy Bouchard M.D.   On: 04/25/2015 19:23  I personally reviewed the imaging tests through PACS system I reviewed available ER/hospitalization records through the EMR    EKG Interpretation None      MDM   Final diagnoses:  Back pain  Thoracic compression fracture, closed, initial encounter   Patient feels better after pain medication.  Discharge home in good condition.  May represent acute or chronic compression fracture versus arthritis of the back.  If his pain continues he likely will need MRI of his spine to  further evaluate given his remote history of prostate cancer.  Per my care follow-up.  Patient understands return the emergency department for new or worsening symptoms.    Jola Schmidt, MD 04/25/15 1958

## 2015-04-25 NOTE — Discharge Instructions (Signed)
Back, Compression Fracture °A compression fracture happens when a force is put upon the length of your spine. Slipping and falling on your bottom are examples of such a force. When this happens, sometimes the force is great enough to compress the building blocks (vertebral bodies) of your spine. Although this causes a lot of pain, this can usually be treated at home, unless your caregiver feels hospitalization is needed for pain control. °Your backbone (spinal column) is made up of 24 main vertebral bodies in addition to the sacrum and coccyx (see illustration). These are held together by tough fibrous tissues (ligaments) and by support of your muscles. Nerve roots pass through the openings between the vertebrae. A sudden wrenching move, injury, or a fall may cause a compression fracture of one of the vertebral bodies. This may result in back pain or spread of pain into the belly (abdomen), the buttocks, and down the leg into the foot. Pain may also be created by muscle spasm alone. °Large studies have been undertaken to determine the best possible course of action to help your back following injury and also to prevent future problems. The recommendations are as follows. °FOLLOWING A COMPRESSION FRACTURE: °Do the following only if advised by your caregiver.  °· If a back brace has been suggested or provided, wear it as directed. °· Do not stop wearing the back brace unless instructed by your caregiver. °· When allowed to return to regular activities, avoid a sedentary lifestyle. Actively exercise. Sporadic weekend binges of tennis, racquetball, or waterskiing may actually aggravate or create problems, especially if you are not in condition for that activity. °· Avoid sports requiring sudden body movements until you are in condition for them. Swimming and walking are safer activities. °· Maintain good posture. °· Avoid obesity. °· If not already done, you should have a DEXA scan. Based on the results, be treated for  osteoporosis. °FOLLOWING ACUTE (SUDDEN) INJURY: °· Only take over-the-counter or prescription medicines for pain, discomfort, or fever as directed by your caregiver. °· Use bed rest for only the most extreme acute episode. Prolonged bed rest may aggravate your condition. Ice used for acute conditions is effective. Use a large plastic bag filled with ice. Wrap it in a towel. This also provides excellent pain relief. This may be continuous. Or use it for 30 minutes every 2 hours during acute phase, then as needed. Heat for 30 minutes prior to activities is helpful. °· As soon as the acute phase (the time when your back is too painful for you to do normal activities) is over, it is important to resume normal activities and work hardening programs. Back injuries can cause potentially marked changes in lifestyle. So it is important to attack these problems aggressively. °· See your caregiver for continued problems. He or she can help or refer you for appropriate exercises, physical therapy, and work hardening if needed. °· If you are given narcotic medications for your condition, for the next 24 hours do not: °¨ Drive. °¨ Operate machinery or power tools. °¨ Sign legal documents. °· Do not drink alcohol, or take sleeping pills or other medications that may interfere with treatment. °If your caregiver has given you a follow-up appointment, it is very important to keep that appointment. Not keeping the appointment could result in a chronic or permanent injury, pain, and disability. If there is any problem keeping the appointment, you must call back to this facility for assistance.  °SEEK IMMEDIATE MEDICAL CARE IF: °· You develop numbness,   tingling, weakness, or problems with the use of your arms or legs. °· You develop severe back pain not relieved with medications. °· You have changes in bowel or bladder control. °· You have increasing pain in any areas of the body. °Document Released: 12/10/2005 Document Revised:  04/26/2014 Document Reviewed: 07/14/2008 °[employer]® Patient Information ©2015 [employer]. This information is not intended to replace advice given to you by your health care provider. Make sure you discuss any questions you have with your health care provider. ° °

## 2015-04-25 NOTE — ED Notes (Signed)
Bed: [facility] Expected date:  Expected time:  Means of arrival:  Comments: EMS- 81yo M, chronic back pain

## 2015-04-26 ENCOUNTER — Encounter: Payer: Self-pay | Admitting: Internal Medicine

## 2015-04-29 ENCOUNTER — Ambulatory Visit (INDEPENDENT_AMBULATORY_CARE_PROVIDER_SITE_OTHER): Payer: Medicare Other | Admitting: Internal Medicine

## 2015-04-29 ENCOUNTER — Other Ambulatory Visit: Payer: Self-pay | Admitting: *Deleted

## 2015-04-29 ENCOUNTER — Encounter: Payer: Self-pay | Admitting: Internal Medicine

## 2015-04-29 ENCOUNTER — Other Ambulatory Visit: Payer: Self-pay | Admitting: Internal Medicine

## 2015-04-29 VITALS — BP 126/58 | HR 79 | Temp 97.4°F | Resp 20 | Ht 69.0 in

## 2015-04-29 DIAGNOSIS — S22000A Wedge compression fracture of unspecified thoracic vertebra, initial encounter for closed fracture: Secondary | ICD-10-CM | POA: Diagnosis not present

## 2015-04-29 DIAGNOSIS — R413 Other amnesia: Secondary | ICD-10-CM

## 2015-04-29 DIAGNOSIS — M810 Age-related osteoporosis without current pathological fracture: Secondary | ICD-10-CM | POA: Diagnosis not present

## 2015-04-29 MED ORDER — METHOCARBAMOL 500 MG PO TABS: 500.0000 mg | ORAL_TABLET | Freq: Three times a day (TID) | ORAL | Status: DC | PRN

## 2015-04-29 MED ORDER — HYDROCODONE-ACETAMINOPHEN 5-325 MG PO TABS: 1.0000 | ORAL_TABLET | ORAL | Status: DC | PRN

## 2015-04-29 MED ORDER — FUROSEMIDE 20 MG PO TABS: 20.0000 mg | ORAL_TABLET | ORAL | Status: DC

## 2015-04-29 NOTE — Patient Instructions (Signed)
Encourage to eat at least 2 meals daily. Drink boost can daily  Take pain med as directed. May cut tablet in 1/2 and take every 4 hours as needed.  Follow up as scheduled with Dr Nyoka Cowden. If pain no better or if still has reduced eating/drinking, take to the ER for evaluation

## 2015-04-29 NOTE — Progress Notes (Signed)
Patient ID: Zachary Harris, male   DOB: 08-10-1923, 81 y.o.   MRN: [id number]    Location:    [facility]   Place of Service:   OFFICE   Chief Complaint  Patient presents with   Acute Visit    Patient c/o Not eating or drinking     HPI:  81 yo male seen today for acute visit. Daughter reports that pt has poor po intake x several days. He has not eaten/drank anything since yesterday. Eats at least 1 good meal per day. He was seen at the ED last week for acute low back pain. He was Rx norco and family believes he took too many tabs.he has ben sleeping a lot. Prior to ER visit, he was taking tramadol. Tramadol does not help current pain.   Prior to ER visit, he was eating better and drinking more. He completed PT at rehab facility in March.   Past Medical History  Diagnosis Date   ANEMIA-IRON DEFICIENCY 05/27/2007   ANXIETY 11/14/2007   Intermittent high-grade heart block 09/05/2009    s/p pacemaker   BENIGN PROSTATIC HYPERTROPHY 11/14/2007   CAROTID ARTERY DISEASE 10/02/2010   Pacemaker-St. Jude 02/02/2008    St. Jude   COLONIC POLYPS, HX OF 05/27/2007   CORONARY ARTERY DISEASE 11/14/2007    prior bypass [phone]; Inferior MI 11/07; Severe 3 vessel disease; occluded SVG to RCA; SVG to OM subtotalled, SVG to diagonal with ostial 70 and 80 mid; EF 45. PCI of SVG to OM. FU myovue showed EF 56 with inferior infarct and very mild peri-infarct ischemia   DEPRESSION 11/14/2007   DIABETES MELLITUS, TYPE II 11/14/2007   DIVERTICULITIS, HX OF 05/27/2007   DVT (deep venous thrombosis) 03/21/2011   FOOT PAIN, BILATERAL 12/08/2010   GERD 11/14/2007   HYPERLIPIDEMIA 11/14/2007   HYPERTENSION 11/14/2007   HYPOTENSION, ORTHOSTATIC 03/06/2011   Memory loss 06/01/2008   OSTEOPOROSIS 06/02/2010   PERIPHERAL VASCULAR DISEASE 11/14/2007   RENAL INSUFFICIENCY 11/14/2007   SECONDARY DM W/PERIPHERAL CIRC D/O UNCONTROLLED 11/07/2010   SYNCOPE 09/08/2008   Unspecified hearing loss 05/13/2009     Prostate cancer    Myalgia and myositis, unspecified 04/24/2011   Other malaise and fatigue 04/24/2011   Peripheral neuropathy 03/27/2011   Seborrheic keratosis 06/05/2011   Osteoarthrosis, unspecified whether generalized or localized, unspecified site    Ischemic cardiomyopathy     a. echo (10/15):  mod focal basal septal hypertrophy, EF 40-45%, inf AK, Gr 1 DD, Al sclerosis, mild AI, MAC, mild MR, mod LAE, lipomatous hypertrophy, small effusion (no hemodynamic compromise)    Effusion of left knee 03/17/2015    Past Surgical History  Procedure Laterality Date   S/p coronary stent x 1     Cholecystectomy     S/p bilat knee replacement  Rolene Arbour, MD   Pacemaker insertion  Scherrie November, MD   Prostate needle biopsy     Cystoscopy     Appendectomy     Coronary artery bypass graft  2007    Patient Care Team: Kimber Relic, MD as PCP - General (Internal Medicine) Bjorn Pippin, MD as Consulting Physician (Urology) Gaylord Shih, MD as Consulting Physician (Cardiology) [facility] Lewayne Bunting, MD as Consulting Physician (Cardiology) Man Mast X, NP as Nurse Practitioner (Nurse Practitioner) Oretha Milch, MD as Consulting Physician (Pulmonary Disease)  History   Social History   Marital Status: Widowed    Spouse Name: N/A  Number of Children: 2   Years of Education: Copywriter, advertising History   retired operated [employer]   Smoking status: Former Smoker -- 1.00 packs/day for 50 years    Types: Cigarettes    Quit date: 10/18/1983   Smokeless tobacco: Never Used   Alcohol Use: No   Drug Use: No   Sexual Activity: No   Other Topics Concern   Not on file   Social History Narrative   Patient is widowed with 2 children.   Patient has a college education.   Patient is right handed.   Patient drinks 2 cups daily.   Walking with wallker     reports that he quit smoking about 31 years ago.  His smoking use included Cigarettes. He has a 50 pack-year smoking history. He has never used smokeless tobacco. He reports that he does not drink alcohol or use illicit drugs.  No Known Allergies  Medications: Patient's Medications  New Prescriptions   No medications on file  Previous Medications   ALENDRONATE (FOSAMAX) 70 MG TABLET    TAKE 1 TAB ONCE A WK AT LEAST 30 MIN BEFORE 1ST FOOD.DO NOT LIE DOWN FOR 30 MIN AFTER TAKING.   ATORVASTATIN (LIPITOR) 40 MG TABLET    TAKE 1 TABLET ONCE A DAY TO CONTROL CHOLESTEROL.   DOCUSATE SODIUM (COLACE) 100 MG CAPSULE    Take 1 capsule (100 mg total) by mouth every 12 (twelve) hours.   DONEPEZIL (ARICEPT) 5 MG TABLET    1 po daily for 1 month then 2 tabs daily   FEEDING SUPPLEMENT, GLUCERNA SHAKE, (GLUCERNA SHAKE) LIQD    Take 237 mLs by mouth 2 (two) times daily between meals.   FENOFIBRATE 160 MG TABLET    TAKE 1 TABLET ONCE A DAY TO CONTROL CHOLESTEROL.   FUROSEMIDE (LASIX) 20 MG TABLET    Take 1 tablet (20 mg total) by mouth every other day.   HYDROCODONE-ACETAMINOPHEN (NORCO/VICODIN) 5-325 MG PER TABLET    Take 1 tablet by mouth every 4 (four) hours as needed for moderate pain.   LOSARTAN (COZAAR) 25 MG TABLET    Take 25 mg by mouth daily.   MEMANTINE (NAMENDA XR) 28 MG CP24 24 HR CAPSULE    Take 1 capsule (28 mg total) by mouth daily.   METFORMIN (GLUCOPHAGE) 500 MG TABLET    TAKE 1 TABLET EACH MORNING TO CONTROL GLUCOSE.   NITROGLYCERIN (NITROSTAT) 0.4 MG SL TABLET    Place 0.4 mg under the tongue every 5 (five) minutes as needed for chest pain.   RIVAROXABAN (XARELTO) 15 MG TABS TABLET    Take 1 tablet (15 mg total) by mouth daily with supper.   SOLIFENACIN (VESICARE) 10 MG TABLET    Take 10 mg by mouth daily.   TAMSULOSIN (FLOMAX) 0.4 MG CAPS CAPSULE    Take 0.4 mg by mouth daily after breakfast. For bladder  Modified Medications   No medications on file  Discontinued Medications   No medications on file    Review of Systems  Unable to  perform ROS: Dementia    Filed Vitals:   04/29/15 1118  BP: 126/58  Pulse: 79  Temp: 97.4 F (36.3 C)  TempSrc: Oral  Resp: 20  Height: $Remove'5\' 9"'[id number]$  (1.753 m)  SpO2: 97%   There is no weight on file to calculate BMI.  Physical Exam  Constitutional: He appears well-developed. No distress.  Frail appearing and looks uncomfortable  in NAD. [place] O2 intact. Sitting in w/c. Son and daughter present  Cardiovascular: Normal rate, regular rhythm and intact distal pulses.   Murmur (1/6 SEM) heard. No LE edema b/l. No calf TTP  Pulmonary/Chest:  Reduced BS at base b/l  Musculoskeletal:       Back:     Labs reviewed: Clinical Support on 04/19/2015  Component Date Value Ref Range Status   Date Time Interrogation Session 04/19/2015 [id number]   Final   Implantable Pulse Generator Manufa* 04/19/2015 [employer]   Final   Implantable Pulse Generator Model 04/19/2015 [address] RF   Final   Implantable Pulse Generator Serial* 04/19/2015 [id number]   Final   Lead Channel Setting Sensing Sensi* 04/19/2015 2   Final   Lead Channel Setting Sensing Adapt* 04/19/2015 Fixed Pacing   Final   Lead Channel Setting Pacing Amplit* 04/19/2015 2   Final   Lead Channel Setting Pacing Pulse * 04/19/2015 0.4   Final   Lead Channel Setting Pacing Amplit* 04/19/2015 2.5   Final   Lead Channel Status 04/19/2015    Final   Lead Channel Impedance Value 04/19/2015 400   Final   Lead Channel Sensing Intrinsic Amp* 04/19/2015 2.6   Final   Lead Channel Pacing Threshold Ampl* 04/19/2015 0.75   Final   Lead Channel Pacing Threshold Puls* 04/19/2015 0.4   Final   Lead Channel Status 04/19/2015    Final   Lead Channel Impedance Value 04/19/2015 530   Final   Lead Channel Sensing Intrinsic Amp* 04/19/2015 12   Final   Lead Channel Pacing Threshold Ampl* 04/19/2015 0.75   Final   Lead Channel Pacing Threshold Puls* 04/19/2015 0.4   Final   Battery Status 04/19/2015 MOS   Final    Battery Remaining Longevity 04/19/2015 106   Final   Battery Remaining Percentage 04/19/2015 82   Final   Battery Voltage 04/19/2015 2.96   Final   Brady Statistic RA Percent Paced 04/19/2015 3   Final   Brady Statistic RV Percent Paced 04/19/2015 33   Final   Brady Statistic AP VP Percent 04/19/2015 1.4   Final   Brady Statistic AS VP Percent 04/19/2015 31   Final   Brady Statistic AP VS Percent 04/19/2015 2.3   Final   Brady Statistic AS VS Percent 04/19/2015 64   Final   Eval Rhythm 04/19/2015 AsVs   Final   Miscellaneous Comment 04/19/2015    Final                   Value:Pacemaker remote check. Device function reviewed. Impedance, sensing, auto capture thresholds consistent with previous measurements. Histograms appropriate for patient and level of activity. All other diagnostic data reviewed and is appropriate and  stable for patient. Real time EGM shows appropriate sensing. 12 mode switches (<1% +xarelto)- longest 2:18:16, pk A 415, pk v 111. No ventricular high rate pisodes. Estimated longevity 8.4-9.3 years. Merlin 07/19/2015, ROV with SK in October.   Admission on 03/15/2015, Discharged on 03/15/2015  Component Date Value Ref Range Status   WBC 03/15/2015 7.5  4.0 - 10.5 K/uL Final   RBC 03/15/2015 4.25  4.22 - 5.81 MIL/uL Final   Hemoglobin 03/15/2015 13.0  13.0 - 17.0 g/dL Final   HCT 03/15/2015 39.9  39.0 - 52.0 % Final   MCV 03/15/2015 93.9  78.0 - 100.0 fL Final   MCH 03/15/2015 30.6  26.0 - 34.0 pg Final   MCHC 03/15/2015 32.6  30.0 -  36.0 g/dL Final   RDW 03/15/2015 13.5  11.5 - 15.5 % Final   Platelets 03/15/2015 142* 150 - 400 K/uL Final   Sodium 03/15/2015 140  135 - 145 mmol/L Final   Potassium 03/15/2015 4.3  3.5 - 5.1 mmol/L Final   Chloride 03/15/2015 106  96 - 112 mmol/L Final   CO2 03/15/2015 26  19 - 32 mmol/L Final   Glucose, Bld 03/15/2015 132* 70 - 99 mg/dL Final   BUN 03/15/2015 37* 6 - 23 mg/dL Final   Creatinine, Ser 03/15/2015  1.30  0.50 - 1.35 mg/dL Final   Calcium 03/15/2015 9.3  8.4 - 10.5 mg/dL Final   GFR calc non Af Amer 03/15/2015 47* >90 mL/min Final   GFR calc Af Amer 03/15/2015 54* >90 mL/min Final   Comment: (NOTE) The eGFR has been calculated using the CKD EPI equation. This calculation has not been validated in all clinical situations. eGFR's persistently <90 mL/min signify possible Chronic Kidney Disease.    Anion gap 03/15/2015 8  5 - 15 Final   B Natriuretic Peptide 03/15/2015 213.8* 0.0 - 100.0 pg/mL Final   Troponin I 03/15/2015 <0.03  <0.031 ng/mL Final   Comment:        NO INDICATION OF MYOCARDIAL INJURY.   Abstract on 02/11/2015  Component Date Value Ref Range Status   Glucose 02/07/2015 119   Final   BUN 02/07/2015 30* 4 - 21 mg/dL Final   Creatinine 02/07/2015 1.2  0.6 - 1.3 mg/dL Final   Potassium 02/07/2015 4.3  3.4 - 5.3 mmol/L Final   Sodium 02/07/2015 137  137 - 147 mmol/L Final   Triglycerides 02/07/2015 115  40 - 160 mg/dL Final   Cholesterol 02/07/2015 132  0 - 200 mg/dL Final   HDL 02/07/2015 33* 35 - 70 mg/dL Final   LDL Cholesterol 02/07/2015 76   Final   Alkaline Phosphatase 02/07/2015 43  25 - 125 U/L Final   ALT 02/07/2015 10  10 - 40 U/L Final   AST 02/07/2015 21  14 - 40 U/L Final   Bilirubin, Total 02/07/2015 0.5   Final   Hgb A1c MFr Bld 02/07/2015 6.6* 4.0 - 6.0 % Final    Dg Lumbar Spine Complete  04/25/2015   CLINICAL DATA:  Low back pain for several days without acute injury  EXAM: LUMBAR SPINE - COMPLETE 4+ VIEW  COMPARISON:  10/14/2013  FINDINGS: There is progressive compression deformity at T12. Approximately 50% loss vertebral body height on today's exam compared to 30% on prior. There is endplate sclerosis at H5-K5 L3-L4 and L4-L5 with associated osteophytosis. No subluxation.  IMPRESSION: 1. No acute fracture. 2. Progressive compression fracture at T12 compared to 10/14/2013 with continued loss of vertebral body height now with  approximately 50% vertebral body height. 3. Multilevel disc osteophytic disease. 4. Atherosclerotic calcification aorta.   Electronically Signed   By: Suzy Bouchard M.D.   On: 04/25/2015 19:23     Assessment/Plan   ICD-9-CM ICD-10-CM   1. Thoracic compression fracture, closed, initial encounter - as seen on xray L spine 805.2 S22.000A Ambulatory referral to Orthopedic Surgery     HYDROcodone-acetaminophen (NORCO/VICODIN) 5-325 MG per tablet     methocarbamol (ROBAXIN) 500 MG tablet  2. Osteoporosis 733.00 M81.0   3. Memory loss 780.93 R41.3     --Encourage to eat at least 2 meals daily. Drink boost can daily  --Take pain med as directed. May cut tablet in 1/2 and take every 4 hours  as needed.  --Follow up as scheduled with Dr Nyoka Cowden. If pain no better or if still has reduced eating/drinking, take to the ER for evaluation  Oluwatosin Higginson S. Perlie Gold  [facility] [address] [place], [place] [address] [phone] Cell (Monday-Friday 8 AM - 5 PM) [phone] After 5 PM and follow prompts

## 2015-04-29 NOTE — Telephone Encounter (Signed)
[facility]  

## 2015-05-02 ENCOUNTER — Telehealth: Payer: Self-pay | Admitting: *Deleted

## 2015-05-02 ENCOUNTER — Telehealth: Payer: Self-pay

## 2015-05-02 MED ORDER — TRAMADOL HCL 50 MG PO TABS: ORAL_TABLET | ORAL | Status: DC

## 2015-05-02 NOTE — Telephone Encounter (Signed)
Patient will be seen in clinic tomorrow morning at [facility]. Discussed situation with Lambert Keto, [place]. She is calling in an order for tramadol and hydrocodone will be discontinued.

## 2015-05-02 NOTE — Telephone Encounter (Signed)
Patient daughter called and stated that patient was seen on Friday by Dr. Eulas Post and he is no better. Has been giving 1/2 tablet at a time of the Hydrocodone like suggested and he is still no better. Still not eating or drinking and very lethargic and out of it. Not controlling is pain very well. Feels like the Hydrocodone is a poor choice. Need something to reduce his pain and help him eat and drink. Please Advise

## 2015-05-02 NOTE — Telephone Encounter (Signed)
Daughter called concern about her father, he hasn't eaten or drink for a week. Saw Dr. Eulas Post Friday 04/29/15, she decrease the Hydrocodone, but it hasn't helped. She called earlier but hasn't gotten a call back and it's 4:00 and just wants some help for her father. Spoke with  Rodena Piety, Dr. Eulas Post is on vacation and has routed a note to Dr. Nyoka Cowden. Told the daughter Rodena Piety is waiting on Dr. Nyoka Cowden to return her message. Carla started crying. I told her I would call Dr. Nyoka Cowden. Verba orders from Dr. Nyoka Cowden call in Tramadol 50mg  #50 take one every 6 hours as needed for pain, make appt for tomorrow to see him at [facility] at 8:45.  Called daughter, [name] told her what Dr. Nyoka Cowden said, she will get him there.

## 2015-05-02 NOTE — Telephone Encounter (Signed)
Daughter called back and stated that the pharmacy recommended Tramadol. The daughter wants something done today because it is going on a week with him not eating or drinking. Please advise.

## 2015-05-02 NOTE — Telephone Encounter (Signed)
Dr. Eulas Post is on vacation and daughter wants to know something today. Please Advise.

## 2015-05-03 ENCOUNTER — Encounter: Payer: Self-pay | Admitting: Internal Medicine

## 2015-05-03 ENCOUNTER — Other Ambulatory Visit: Payer: Medicare Other

## 2015-05-03 ENCOUNTER — Non-Acute Institutional Stay: Payer: Medicare Other | Admitting: Internal Medicine

## 2015-05-03 VITALS — BP 158/66 | HR 96 | Temp 97.7°F | Wt 167.5 lb

## 2015-05-03 DIAGNOSIS — R531 Weakness: Secondary | ICD-10-CM

## 2015-05-03 DIAGNOSIS — N183 Chronic kidney disease, stage 3 (moderate): Secondary | ICD-10-CM | POA: Diagnosis not present

## 2015-05-03 DIAGNOSIS — F329 Major depressive disorder, single episode, unspecified: Secondary | ICD-10-CM | POA: Diagnosis not present

## 2015-05-03 DIAGNOSIS — R634 Abnormal weight loss: Secondary | ICD-10-CM

## 2015-05-03 DIAGNOSIS — M4850XD Collapsed vertebra, not elsewhere classified, site unspecified, subsequent encounter for fracture with routine healing: Secondary | ICD-10-CM

## 2015-05-03 DIAGNOSIS — D509 Iron deficiency anemia, unspecified: Secondary | ICD-10-CM

## 2015-05-03 DIAGNOSIS — R627 Adult failure to thrive: Secondary | ICD-10-CM | POA: Diagnosis not present

## 2015-05-03 DIAGNOSIS — I10 Essential (primary) hypertension: Secondary | ICD-10-CM

## 2015-05-03 DIAGNOSIS — F32A Depression, unspecified: Secondary | ICD-10-CM

## 2015-05-03 DIAGNOSIS — R413 Other amnesia: Secondary | ICD-10-CM | POA: Diagnosis not present

## 2015-05-03 DIAGNOSIS — IMO0001 Reserved for inherently not codable concepts without codable children: Secondary | ICD-10-CM

## 2015-05-03 DIAGNOSIS — E1022 Type 1 diabetes mellitus with diabetic chronic kidney disease: Secondary | ICD-10-CM | POA: Diagnosis not present

## 2015-05-03 HISTORY — DX: Adult failure to thrive: R62.7

## 2015-05-03 NOTE — Progress Notes (Signed)
Patient ID: Zachary Harris, male   DOB: 29-Mar-1923, 81 y.o.   MRN: [id number]    [place]: Clinic (12)     No Known Allergies  Chief Complaint  Patient presents with   Acute Visit    pain low back, not eating or drinking, per son Zachary Harris    HPI:  Failure to thrive in adult - [name] has not been doing well living area. His oral intake is poor. He complains constantly of his back hurting. Family believes that current pain medications may be interfering with his appetite and making him too sleepy as well as reducing strength to the point he is not getting up. Son says that he has not eaten in 4 days, but he has been drinking up to 3 Boost supplements per day. His current pain medication is Vicodin. He also has tramadol on hand. He has lost about 10 pounds since last seen.  Vertebral compression fracture, with routine healing, subsequent encounter: Presumed source of most of his back pain.  Essential hypertension: Mild systolic blood pressure elevation.  Memory loss: Seems worse than the last time I saw him. He is able to answer questions in a responsible way. Memory loss may contribute some to his withdrawal and sleepiness.  Iron deficiency anemia - needs further follow-up.  Depression: May be part of his problems of sleeping as well as inanition.  CKD stage 3 due to type 1 diabetes mellitus: Needs further follow-up due to his poor oral intake. He is probably consuming about a quart of fluids per day according to the history given by his son.  Weak: Appears weaker than when he was recently in the skilled nursing area.  Weight loss, weight loss corresponds to poor oral intake.    Medications: Patient's Medications  New Prescriptions   No medications on file  Previous Medications   ALENDRONATE (FOSAMAX) 70 MG TABLET    TAKE 1 TAB ONCE A WK AT LEAST 30 MIN BEFORE 1ST FOOD.DO NOT LIE DOWN FOR 30 MIN AFTER TAKING.   ATORVASTATIN (LIPITOR) 40  MG TABLET    TAKE 1 TABLET ONCE A DAY TO CONTROL CHOLESTEROL.   DOCUSATE SODIUM (COLACE) 100 MG CAPSULE    Take 1 capsule (100 mg total) by mouth every 12 (twelve) hours.   DONEPEZIL (ARICEPT) 10 MG TABLET    Take one tablet daily   FEEDING SUPPLEMENT, GLUCERNA SHAKE, (GLUCERNA SHAKE) LIQD    Take 237 mLs by mouth 2 (two) times daily between meals.   FENOFIBRATE 160 MG TABLET    TAKE 1 TABLET ONCE A DAY TO CONTROL CHOLESTEROL.   FUROSEMIDE (LASIX) 20 MG TABLET    Take 1 tablet (20 mg total) by mouth every other day.   HYDROCODONE-ACETAMINOPHEN (NORCO/VICODIN) 5-325 MG PER TABLET    Take 1 tablet by mouth every 4 (four) hours as needed for moderate pain.   LOSARTAN (COZAAR) 25 MG TABLET    Take 25 mg by mouth daily.   MEMANTINE (NAMENDA XR) 28 MG CP24 24 HR CAPSULE    Take 1 capsule (28 mg total) by mouth daily.   METFORMIN (GLUCOPHAGE) 500 MG TABLET    TAKE 1 TABLET EACH MORNING TO CONTROL GLUCOSE.   METHOCARBAMOL (ROBAXIN) 500 MG TABLET    Take 1 tablet (500 mg total) by mouth every 8 (eight) hours as needed for muscle spasms.   NITROGLYCERIN (NITROSTAT) 0.4 MG SL TABLET    Place 0.4 mg under  the tongue every 5 (five) minutes as needed for chest pain.   RIVAROXABAN (XARELTO) 15 MG TABS TABLET    Take 1 tablet (15 mg total) by mouth daily with supper.   SOLIFENACIN (VESICARE) 10 MG TABLET    Take 10 mg by mouth daily.   TAMSULOSIN (FLOMAX) 0.4 MG CAPS CAPSULE    Take 0.4 mg by mouth daily after breakfast. For bladder   TRAMADOL (ULTRAM) 50 MG TABLET    Take one tablet every 6 hours as needed for pain  Modified Medications   No medications on file  Discontinued Medications   DONEPEZIL (ARICEPT) 5 MG TABLET    1 po daily for 1 month then 2 tabs daily     Review of Systems  Constitutional: Negative for fever, chills, activity change, appetite change, fatigue and unexpected weight change.  HENT: Positive for hearing loss. Negative for ear pain and sinus pressure.   Eyes: Negative for  photophobia, pain and visual disturbance.       Corrective lenses  Respiratory: Positive for shortness of breath. Negative for cough, chest tightness and wheezing.   Cardiovascular: Positive for leg swelling.       Pacemaker. History of syncope related to cardiac arrhythmia.  Gastrointestinal: Negative for abdominal pain and abdominal distention.       Poor appetite  Endocrine: Negative for cold intolerance, heat intolerance, polydipsia, polyphagia and polyuria.       History of diabetes.  Genitourinary: Positive for urgency and frequency. Negative for dysuria and flank pain.       Urge incontinence. Increased frequency which has not responded well to [place]. Continues to see urologist. Now taking oxybutynin 3 times a day. Episodes of incontinence persist.  Musculoskeletal: Positive for myalgias, back pain, arthralgias, gait problem and neck pain (Fall on 03/15/15 causing pain in the left knee and effusion.).       Poor balance and gait disturbance. Able to stand without assistance.  Skin:       Rough area on left cheek  Allergic/Immunologic: Negative.   Neurological: Negative for tremors, seizures, facial asymmetry and weakness.       Memory loss  Hematological: Negative.   Psychiatric/Behavioral: Positive for confusion.       Mild to moderate memory disturbance.    Filed Vitals:   05/03/15 0843  BP: 158/66  Pulse: 96  Temp: 97.7 F (36.5 C)  TempSrc: Oral  Weight: 167 lb 8 oz (75.978 kg)  SpO2: 99%   Body mass index is 24.72 kg/(m^2).  Physical Exam  Constitutional: He is oriented to person, place, and time.  Frail  HENT:  Head: Normocephalic and atraumatic.  Nose: Nose normal.  Bilateral hearing aids.  Eyes: Conjunctivae are normal. Pupils are equal, round, and reactive to light.  Prescription lenses.  Neck: Normal range of motion. Neck supple. No JVD present. No tracheal deviation present. No thyromegaly present.  Cardiovascular: Normal rate and regular rhythm.  Exam  reveals no gallop and no friction rub.   Murmur heard.  Systolic murmur is present with a grade of 1/6  Absent DP and PT  Pulmonary/Chest: Effort normal and breath sounds normal. No respiratory distress. He has no wheezes. He has no rales. He exhibits no tenderness.  Using portable oxygen at 2 L/m today.  Abdominal: Soft. Bowel sounds are normal. He exhibits no distension and no mass. There is no tenderness.  Musculoskeletal: Normal range of motion. He exhibits edema. He exhibits no tenderness.  Pain in the left costovertebral  area.  Lymphadenopathy:    He has no cervical adenopathy.  Neurological: He is alert and oriented to person, place, and time. He has normal strength. A sensory deficit is present. No cranial nerve deficit. Gait abnormal.  07/13/14 MMSE 28/30. Passed clock drawing. Diminshed sensation to vabration and monofilament.  Skin: Skin is warm and dry. No erythema. No pallor.  Rough area on the left cheek. Cystic lesion in the mid forehead.  Psychiatric: He has a normal mood and affect. His behavior is normal. Thought content normal.  Patient is in denial regarding the extent of his memory deficit.     Labs reviewed: Clinical Support on 04/19/2015  Component Date Value Ref Range Status   Date Time Interrogation Session 04/19/2015 [id number]   Final   Implantable Pulse Generator Manufa* 04/19/2015 [employer]   Final   Implantable Pulse Generator Model 04/19/2015 2210 [address] DR RF   Final   Implantable Pulse Generator Serial* 04/19/2015 [id number]   Final   Lead Channel Setting Sensing Sensi* 04/19/2015 2   Final   Lead Channel Setting Sensing Adapt* 04/19/2015 Fixed Pacing   Final   Lead Channel Setting Pacing Amplit* 04/19/2015 2   Final   Lead Channel Setting Pacing Pulse * 04/19/2015 0.4   Final   Lead Channel Setting Pacing Amplit* 04/19/2015 2.5   Final   Lead Channel Status 04/19/2015    Final   Lead Channel Impedance Value 04/19/2015 400    Final   Lead Channel Sensing Intrinsic Amp* 04/19/2015 2.6   Final   Lead Channel Pacing Threshold Ampl* 04/19/2015 0.75   Final   Lead Channel Pacing Threshold Puls* 04/19/2015 0.4   Final   Lead Channel Status 04/19/2015    Final   Lead Channel Impedance Value 04/19/2015 530   Final   Lead Channel Sensing Intrinsic Amp* 04/19/2015 12   Final   Lead Channel Pacing Threshold Ampl* 04/19/2015 0.75   Final   Lead Channel Pacing Threshold Puls* 04/19/2015 0.4   Final   Battery Status 04/19/2015 MOS   Final   Battery Remaining Longevity 04/19/2015 106   Final   Battery Remaining Percentage 04/19/2015 82   Final   Battery Voltage 04/19/2015 2.96   Final   Brady Statistic RA Percent Paced 04/19/2015 3   Final   Brady Statistic RV Percent Paced 04/19/2015 33   Final   Brady Statistic AP VP Percent 04/19/2015 1.4   Final   Brady Statistic AS VP Percent 04/19/2015 31   Final   Brady Statistic AP VS Percent 04/19/2015 2.3   Final   Brady Statistic AS VS Percent 04/19/2015 64   Final   Eval Rhythm 04/19/2015 AsVs   Final   Miscellaneous Comment 04/19/2015    Final                   Value:Pacemaker remote check. Device function reviewed. Impedance, sensing, auto capture thresholds consistent with previous measurements. Histograms appropriate for patient and level of activity. All other diagnostic data reviewed and is appropriate and  stable for patient. Real time EGM shows appropriate sensing. 12 mode switches (<1% +xarelto)- longest 2:18:16, pk A 415, pk v 111. No ventricular high rate pisodes. Estimated longevity 8.4-9.3 years. Merlin 07/19/2015, ROV with SK in October.   Admission on 03/15/2015, Discharged on 03/15/2015  Component Date Value Ref Range Status   WBC 03/15/2015 7.5  4.0 - 10.5 K/uL Final   RBC 03/15/2015 4.25  4.22 - 5.81 MIL/uL Final  Hemoglobin 03/15/2015 13.0  13.0 - 17.0 g/dL Final   HCT 03/15/2015 39.9  39.0 - 52.0 % Final   MCV 03/15/2015 93.9   78.0 - 100.0 fL Final   MCH 03/15/2015 30.6  26.0 - 34.0 pg Final   MCHC 03/15/2015 32.6  30.0 - 36.0 g/dL Final   RDW 03/15/2015 13.5  11.5 - 15.5 % Final   Platelets 03/15/2015 142* 150 - 400 K/uL Final   Sodium 03/15/2015 140  135 - 145 mmol/L Final   Potassium 03/15/2015 4.3  3.5 - 5.1 mmol/L Final   Chloride 03/15/2015 106  96 - 112 mmol/L Final   CO2 03/15/2015 26  19 - 32 mmol/L Final   Glucose, Bld 03/15/2015 132* 70 - 99 mg/dL Final   BUN 03/15/2015 37* 6 - 23 mg/dL Final   Creatinine, Ser 03/15/2015 1.30  0.50 - 1.35 mg/dL Final   Calcium 03/15/2015 9.3  8.4 - 10.5 mg/dL Final   GFR calc non Af Amer 03/15/2015 47* >90 mL/min Final   GFR calc Af Amer 03/15/2015 54* >90 mL/min Final   Comment: (NOTE) The eGFR has been calculated using the CKD EPI equation. This calculation has not been validated in all clinical situations. eGFR's persistently <90 mL/min signify possible Chronic Kidney Disease.    Anion gap 03/15/2015 8  5 - 15 Final   B Natriuretic Peptide 03/15/2015 213.8* 0.0 - 100.0 pg/mL Final   Troponin I 03/15/2015 <0.03  <0.031 ng/mL Final   Comment:        NO INDICATION OF MYOCARDIAL INJURY.   Abstract on 02/11/2015  Component Date Value Ref Range Status   Glucose 02/07/2015 119   Final   BUN 02/07/2015 30* 4 - 21 mg/dL Final   Creatinine 02/07/2015 1.2  0.6 - 1.3 mg/dL Final   Potassium 02/07/2015 4.3  3.4 - 5.3 mmol/L Final   Sodium 02/07/2015 137  137 - 147 mmol/L Final   Triglycerides 02/07/2015 115  40 - 160 mg/dL Final   Cholesterol 02/07/2015 132  0 - 200 mg/dL Final   HDL 02/07/2015 33* 35 - 70 mg/dL Final   LDL Cholesterol 02/07/2015 76   Final   Alkaline Phosphatase 02/07/2015 43  25 - 125 U/L Final   ALT 02/07/2015 10  10 - 40 U/L Final   AST 02/07/2015 21  14 - 40 U/L Final   Bilirubin, Total 02/07/2015 0.5   Final   Hgb A1c MFr Bld 02/07/2015 6.6* 4.0 - 6.0 % Final     Assessment/Plan  1. Failure to thrive in  adult Recommended stopping Vicodin and tramadol. He will use Tylenol for pain relief. Hopefully his drowsiness will improve. I emphasized the importance of adequate fluid and nutritional intake the patient and his son. - CMP - Urinalysis  2. Vertebral compression fracture, with routine healing, subsequent encounter Continues with complaints of low back discomfort. If this continues, additional x-rays may be needed to see if he has further vertebral collapse.  3. Essential hypertension Mild systolic blood pressure elevation. Will observe for now. I hesitate to add more medications in view of his poor intake.  4. Memory loss Stress of current events may be making his memory loss appears worse than if he was doing well.  5. Iron deficiency anemia - CBC With Differential  6. Depression Continue current medication  7. CKD stage 3 due to type 1 diabetes mellitus -CMP, future   8. Weak Due to pains, I hesitate to resume home physical therapy at  this time. Our goal would be to resume this in the future.  9. Weight loss Increase fluid and caloric intake.

## 2015-05-04 LAB — CBC WITH DIFFERENTIAL
BASOS ABS: 0 10*3/uL (ref 0.0–0.2)
BASOS: 0 %
EOS (ABSOLUTE): 0.3 10*3/uL (ref 0.0–0.4)
Eos: 3 %
Hematocrit: 42.1 % (ref 37.5–51.0)
Hemoglobin: 13.9 g/dL (ref 12.6–17.7)
Immature Grans (Abs): 0 10*3/uL (ref 0.0–0.1)
Immature Granulocytes: 0 %
LYMPHS ABS: 1.8 10*3/uL (ref 0.7–3.1)
Lymphs: 19 %
MCH: 30.7 pg (ref 26.6–33.0)
MCHC: 33 g/dL (ref 31.5–35.7)
MCV: 93 fL (ref 79–97)
Monocytes Absolute: 0.7 10*3/uL (ref 0.1–0.9)
Monocytes: 7 %
NEUTROS ABS: 6.8 10*3/uL (ref 1.4–7.0)
NEUTROS PCT: 71 %
RBC: 4.53 x10E6/uL (ref 4.14–5.80)
RDW: 14.2 % (ref 12.3–15.4)
WBC: 9.6 10*3/uL (ref 3.4–10.8)

## 2015-05-04 LAB — COMPREHENSIVE METABOLIC PANEL
A/G RATIO: 1.2 (ref 1.1–2.5)
ALBUMIN: 3.9 g/dL (ref 3.5–4.7)
ALT: 11 IU/L (ref 0–44)
AST: 21 IU/L (ref 0–40)
Alkaline Phosphatase: 66 IU/L (ref 39–117)
BUN/Creatinine Ratio: 37 — ABNORMAL HIGH (ref 10–22)
BUN: 51 mg/dL — AB (ref 8–27)
Bilirubin Total: 0.8 mg/dL (ref 0.0–1.2)
CO2: 26 mmol/L (ref 18–29)
Calcium: 10 mg/dL (ref 8.6–10.2)
Chloride: 98 mmol/L (ref 97–108)
Creatinine, Ser: 1.39 mg/dL — ABNORMAL HIGH (ref 0.76–1.27)
GFR calc Af Amer: 52 mL/min/{1.73_m2} — ABNORMAL LOW (ref >59)
GFR calc non Af Amer: 45 mL/min/{1.73_m2} — ABNORMAL LOW (ref >59)
GLUCOSE: 205 mg/dL — AB (ref 65–99)
Globulin, Total: 3.2 g/dL (ref 1.5–4.5)
Potassium: 4.1 mmol/L (ref 3.5–5.2)
Sodium: 141 mmol/L (ref 134–144)
TOTAL PROTEIN: 7.1 g/dL (ref 6.0–8.5)

## 2015-05-04 LAB — URINALYSIS
BILIRUBIN UA: NEGATIVE
GLUCOSE, UA: NEGATIVE
Ketones, UA: NEGATIVE
Leukocytes, UA: NEGATIVE
Nitrite, UA: NEGATIVE
Protein, UA: NEGATIVE
RBC, UA: NEGATIVE
Specific Gravity, UA: 1.025 (ref 1.005–1.030)
UUROB: 1 mg/dL (ref 0.2–1.0)
pH, UA: 5.5 (ref 5.0–7.5)

## 2015-05-05 ENCOUNTER — Telehealth: Payer: Self-pay | Admitting: *Deleted

## 2015-05-05 ENCOUNTER — Encounter: Payer: Self-pay | Admitting: *Deleted

## 2015-05-05 NOTE — Telephone Encounter (Signed)
Received prior Authorization request from [facility] for Lidocaine 5% Patch. I didn't see medication in patient's current medication list. Spoke with Dr. Nyoka Cowden and he stated that It was prescribed on the 10th at [place] at [facility] for severe Back Pain. I called insurance company # [phone] and got it approved till 12/06/15. Florentina Jenny, daughter Notified [phone]

## 2015-05-09 DIAGNOSIS — M4854XA Collapsed vertebra, not elsewhere classified, thoracic region, initial encounter for fracture: Secondary | ICD-10-CM | POA: Diagnosis not present

## 2015-05-09 DIAGNOSIS — S22009A Unspecified fracture of unspecified thoracic vertebra, initial encounter for closed fracture: Secondary | ICD-10-CM | POA: Diagnosis not present

## 2015-05-09 DIAGNOSIS — M545 Low back pain: Secondary | ICD-10-CM | POA: Diagnosis not present

## 2015-05-10 ENCOUNTER — Encounter: Payer: Self-pay | Admitting: Internal Medicine

## 2015-05-10 ENCOUNTER — Non-Acute Institutional Stay: Payer: Medicare Other | Admitting: Internal Medicine

## 2015-05-10 VITALS — BP 100/58 | HR 84 | Temp 97.3°F | Wt 186.0 lb

## 2015-05-10 DIAGNOSIS — R627 Adult failure to thrive: Secondary | ICD-10-CM

## 2015-05-10 DIAGNOSIS — M4850XD Collapsed vertebra, not elsewhere classified, site unspecified, subsequent encounter for fracture with routine healing: Secondary | ICD-10-CM

## 2015-05-10 DIAGNOSIS — E1122 Type 2 diabetes mellitus with diabetic chronic kidney disease: Secondary | ICD-10-CM

## 2015-05-10 DIAGNOSIS — G629 Polyneuropathy, unspecified: Secondary | ICD-10-CM

## 2015-05-10 DIAGNOSIS — E1022 Type 1 diabetes mellitus with diabetic chronic kidney disease: Secondary | ICD-10-CM | POA: Diagnosis not present

## 2015-05-10 DIAGNOSIS — E1142 Type 2 diabetes mellitus with diabetic polyneuropathy: Secondary | ICD-10-CM | POA: Diagnosis not present

## 2015-05-10 DIAGNOSIS — M545 Low back pain, unspecified: Secondary | ICD-10-CM

## 2015-05-10 DIAGNOSIS — E1129 Type 2 diabetes mellitus with other diabetic kidney complication: Secondary | ICD-10-CM

## 2015-05-10 DIAGNOSIS — R634 Abnormal weight loss: Secondary | ICD-10-CM | POA: Diagnosis not present

## 2015-05-10 DIAGNOSIS — R413 Other amnesia: Secondary | ICD-10-CM

## 2015-05-10 DIAGNOSIS — R531 Weakness: Secondary | ICD-10-CM | POA: Diagnosis not present

## 2015-05-10 DIAGNOSIS — N189 Chronic kidney disease, unspecified: Secondary | ICD-10-CM

## 2015-05-10 DIAGNOSIS — I5042 Chronic combined systolic (congestive) and diastolic (congestive) heart failure: Secondary | ICD-10-CM | POA: Diagnosis not present

## 2015-05-10 DIAGNOSIS — I10 Essential (primary) hypertension: Secondary | ICD-10-CM | POA: Diagnosis not present

## 2015-05-10 DIAGNOSIS — N183 Chronic kidney disease, stage 3 (moderate): Secondary | ICD-10-CM

## 2015-05-10 DIAGNOSIS — IMO0001 Reserved for inherently not codable concepts without codable children: Secondary | ICD-10-CM

## 2015-05-10 HISTORY — DX: Type 2 diabetes mellitus with other diabetic kidney complication: E11.29

## 2015-05-10 NOTE — Progress Notes (Signed)
Patient ID: Zachary Harris, male   DOB: 08-16-1923, 81 y.o.   MRN: [id number]    [facility]     Place of Service: Clinic (12)     Allergies  Allergen Reactions   Hydrocodone     Chief Complaint  Patient presents with   Medical Management of Chronic Issues    one week follow-up, failure to thrive, back pain, blood pressure   Insomnia    slept all last night per son [name]    HPI:   Patient was seen last week with very poor intake. Intake of boost is at least 3 cans per day. He is eating little solid food at this time. He says he simply has no appetite. He denies dysphagia or heartburn.  Failure to thrive in adult: Weight today is higher than last week, but he is wearing a back brace which he did not have on last week. He remains weak and not moving about much. He sleeps more than usual according to the family.  CKD stage 3 due to type 1 diabetes mellitus: BUN and creatinine elevated over baseline values. Still using Lasix. Oral fluid intake is less than desirable.  Midline low back pain without sciatica using back brace. Family was suspicious that pain medications were aggravating his drowsiness so they have surreptitiously remove them from his medication list. Although he complains of back pain, he seems to be tolerating whatever pain is there.  Chronic combined systolic and diastolic CHF (congestive heart failure): Remains short of breath, but it is no worse than in the past.  DM type 2 with diabetic peripheral neuropathy: Glucose was elevated at 200 mg percent on recent lab.  Essential hypertension: Now running on the low side.  Memory loss: Unchanged. Takes a long time to process his answers, but is able to engage in conversation that is meaningful.  Vertebral compression fracture, with routine healing, subsequent encounter continues with back discomfort. Rigid back brace seems to be helping some.  Weak: Losing strength with this constant bed and chair rest  according to his son. Previously had physical therapy coming by the house. They would like to start this again.  Weight loss: I think weight is basically unchanged from last week even though the measured weight is higher. This is likely due to wearing of the brace and more clothing when he had last week.    Medications: Patient's Medications  New Prescriptions   No medications on file  Previous Medications   ALENDRONATE (FOSAMAX) 70 MG TABLET    TAKE 1 TAB ONCE A WK AT LEAST 30 MIN BEFORE 1ST FOOD.DO NOT LIE DOWN FOR 30 MIN AFTER TAKING.   ATORVASTATIN (LIPITOR) 40 MG TABLET    TAKE 1 TABLET ONCE A DAY TO CONTROL CHOLESTEROL.   DOCUSATE SODIUM (COLACE) 100 MG CAPSULE    Take 1 capsule (100 mg total) by mouth every 12 (twelve) hours.   DONEPEZIL (ARICEPT) 10 MG TABLET    Take one tablet daily   FEEDING SUPPLEMENT, GLUCERNA SHAKE, (GLUCERNA SHAKE) LIQD    Take 237 mLs by mouth 2 (two) times daily between meals.   FENOFIBRATE 160 MG TABLET    TAKE 1 TABLET ONCE A DAY TO CONTROL CHOLESTEROL.   FUROSEMIDE (LASIX) 20 MG TABLET    Take 1 tablet (20 mg total) by mouth every other day.   HYDROCODONE-ACETAMINOPHEN (NORCO/VICODIN) 5-325 MG PER TABLET    Take 1 tablet by mouth every 4 (four) hours as needed for moderate pain.  LOSARTAN (COZAAR) 25 MG TABLET    Take 25 mg by mouth daily.   MEMANTINE (NAMENDA XR) 28 MG CP24 24 HR CAPSULE    Take 1 capsule (28 mg total) by mouth daily.   METFORMIN (GLUCOPHAGE) 500 MG TABLET    TAKE 1 TABLET EACH MORNING TO CONTROL GLUCOSE.   METHOCARBAMOL (ROBAXIN) 500 MG TABLET    Take 1 tablet (500 mg total) by mouth every 8 (eight) hours as needed for muscle spasms.   NITROGLYCERIN (NITROSTAT) 0.4 MG SL TABLET    Place 0.4 mg under the tongue every 5 (five) minutes as needed for chest pain.   RIVAROXABAN (XARELTO) 15 MG TABS TABLET    Take 1 tablet (15 mg total) by mouth daily with supper.   SOLIFENACIN (VESICARE) 10 MG TABLET    Take 10 mg by mouth daily.    TAMSULOSIN (FLOMAX) 0.4 MG CAPS CAPSULE    Take 0.4 mg by mouth daily after breakfast. For bladder   TRAMADOL (ULTRAM) 50 MG TABLET    Take one tablet every 6 hours as needed for pain  Modified Medications   No medications on file  Discontinued Medications   No medications on file     Review of Systems  Constitutional: Negative for fever, chills, activity change, appetite change, fatigue and unexpected weight change.  HENT: Positive for hearing loss. Negative for ear pain and sinus pressure.   Eyes: Negative for photophobia, pain and visual disturbance.       Corrective lenses  Respiratory: Positive for shortness of breath. Negative for cough, chest tightness and wheezing.   Cardiovascular: Positive for leg swelling.       Pacemaker. History of syncope related to cardiac arrhythmia.  Gastrointestinal: Negative for abdominal pain and abdominal distention.       Poor appetite  Endocrine: Negative for cold intolerance, heat intolerance, polydipsia, polyphagia and polyuria.       History of diabetes.  Genitourinary: Positive for urgency and frequency. Negative for dysuria and flank pain.       Urge incontinence. Increased frequency which has not responded well to Shiner. Continues to see urologist. Episodes of incontinence persist.  Musculoskeletal: Positive for myalgias, back pain, arthralgias, gait problem and neck pain (Fall on 03/15/15 causing pain in the left knee and effusion.).       Poor balance and gait disturbance. Able to stand without assistance.  Skin:       Rough area on left cheek  Allergic/Immunologic: Negative.   Neurological: Negative for tremors, seizures, facial asymmetry and weakness.       Memory loss  Hematological: Negative.   Psychiatric/Behavioral: Positive for confusion.       Mild to moderate memory disturbance.    Filed Vitals:   05/10/15 1139  BP: 100/58  Pulse: 84  Temp: 97.3 F (36.3 C)  TempSrc: Oral  Weight: 186 lb (84.369 kg)  SpO2: 95%    Body mass index is 27.45 kg/(m^2).  Physical Exam  Constitutional: He is oriented to person, place, and time.  Frail, thin.  HENT:  Head: Normocephalic and atraumatic.  Nose: Nose normal.  Bilateral hearing aids.  Eyes: Conjunctivae are normal. Pupils are equal, round, and reactive to light.  Prescription lenses.  Neck: Normal range of motion. Neck supple. No JVD present. No tracheal deviation present. No thyromegaly present.  Cardiovascular: Normal rate and regular rhythm.  Exam reveals no gallop and no friction rub.   Murmur heard.  Systolic murmur is present with a grade  of 1/6  Absent DP and PT  Pulmonary/Chest: Effort normal and breath sounds normal. No respiratory distress. He has no wheezes. He has no rales. He exhibits no tenderness.  Using portable oxygen at 2 L/m today.  Abdominal: Soft. Bowel sounds are normal. He exhibits no distension and no mass. There is no tenderness.  Musculoskeletal: Normal range of motion. He exhibits edema. He exhibits no tenderness.  Pain in the left costovertebral area. Wearing rigid back brace.  Lymphadenopathy:    He has no cervical adenopathy.  Neurological: He is alert and oriented to person, place, and time. He has normal strength. A sensory deficit is present. No cranial nerve deficit. Gait abnormal.  07/13/14 MMSE 28/30. Passed clock drawing. Diminshed sensation to vabration and monofilament.  Skin: Skin is warm and dry. No erythema. No pallor.  Rough area on the left cheek. Cystic lesion in the mid forehead.  Psychiatric: He has a normal mood and affect. His behavior is normal. Thought content normal.  Patient is in denial regarding the extent of his memory deficit.     Labs reviewed: Nursing Home on 05/03/2015  Component Date Value Ref Range Status   WBC 05/03/2015 9.6  3.4 - 10.8 x10E3/uL Final   RBC 05/03/2015 4.53  4.14 - 5.80 x10E6/uL Final   Hemoglobin 05/03/2015 13.9  12.6 - 17.7 g/dL Final   Hematocrit 26/08/2565  42.1  37.5 - 51.0 % Final   MCV 05/03/2015 93  79 - 97 fL Final   MCH 05/03/2015 30.7  26.6 - 33.0 pg Final   MCHC 05/03/2015 33.0  31.5 - 35.7 g/dL Final   RDW 35/02/8386 14.2  12.3 - 15.4 % Final   NEUTROPHILS 05/03/2015 71   Final   Lymphs 05/03/2015 19   Final   Monocytes 05/03/2015 7   Final   Eos 05/03/2015 3   Final   Basos 05/03/2015 0   Final   Neutrophils Absolute 05/03/2015 6.8  1.4 - 7.0 x10E3/uL Final   Lymphocytes Absolute 05/03/2015 1.8  0.7 - 3.1 x10E3/uL Final   Monocytes Absolute 05/03/2015 0.7  0.1 - 0.9 x10E3/uL Final   EOS (ABSOLUTE) 05/03/2015 0.3  0.0 - 0.4 x10E3/uL Final   Basophils Absolute 05/03/2015 0.0  0.0 - 0.2 x10E3/uL Final   Immature Granulocytes 05/03/2015 0   Final   Immature Grans (Abs) 05/03/2015 0.0  0.0 - 0.1 x10E3/uL Final   Glucose 05/03/2015 205* 65 - 99 mg/dL Final   BUN 04/93/7902 51* 8 - 27 mg/dL Final   Creatinine, Ser 05/03/2015 1.39* 0.76 - 1.27 mg/dL Final   GFR calc non Af Amer 05/03/2015 45* >59 mL/min/1.73 Final   GFR calc Af Amer 05/03/2015 52* >59 mL/min/1.73 Final   BUN/Creatinine Ratio 05/03/2015 37* 10 - 22 Final   Sodium 05/03/2015 141  134 - 144 mmol/L Final   Potassium 05/03/2015 4.1  3.5 - 5.2 mmol/L Final   Chloride 05/03/2015 98  97 - 108 mmol/L Final   CO2 05/03/2015 26  18 - 29 mmol/L Final   Calcium 05/03/2015 10.0  8.6 - 10.2 mg/dL Final   Total Protein 22/54/6564 7.1  6.0 - 8.5 g/dL Final   Albumin 99/32/8791 3.9  3.5 - 4.7 g/dL Final   Globulin, Total 05/03/2015 3.2  1.5 - 4.5 g/dL Final   Albumin/Globulin Ratio 05/03/2015 1.2  1.1 - 2.5 Final   Bilirubin Total 05/03/2015 0.8  0.0 - 1.2 mg/dL Final   Alkaline Phosphatase 05/03/2015 66  39 - 117 IU/L Final  AST 05/03/2015 21  0 - 40 IU/L Final   ALT 05/03/2015 11  0 - 44 IU/L Final   Specific Gravity, UA 05/03/2015 1.025  1.005 - 1.030 Final   pH, UA 05/03/2015 5.5  5.0 - 7.5 Final   Color, UA 05/03/2015 Yellow  Yellow  Final   Appearance Ur 05/03/2015 Clear  Clear Final   Leukocytes, UA 05/03/2015 Negative  Negative Final   Protein, UA 05/03/2015 Negative  Negative/Trace Final   Glucose, UA 05/03/2015 Negative  Negative Final   Ketones, UA 05/03/2015 Negative  Negative Final   RBC, UA 05/03/2015 Negative  Negative Final   Bilirubin, UA 05/03/2015 Negative  Negative Final   Urobilinogen, Ur 05/03/2015 1.0  0.2 - 1.0 mg/dL Final   Nitrite, UA 05/03/2015 Negative  Negative Final  Clinical Support on 04/19/2015  Component Date Value Ref Range Status   Date Time Interrogation Session 04/19/2015 [id number]   Final   Implantable Pulse Generator Manufa* 04/19/2015 [employer]   Final   Implantable Pulse Generator Model 04/19/2015 [address] RF   Final   Implantable Pulse Generator Serial* 04/19/2015 [id number]   Final   Lead Channel Setting Sensing Sensi* 04/19/2015 2   Final   Lead Channel Setting Sensing Adapt* 04/19/2015 Fixed Pacing   Final   Lead Channel Setting Pacing Amplit* 04/19/2015 2   Final   Lead Channel Setting Pacing Pulse * 04/19/2015 0.4   Final   Lead Channel Setting Pacing Amplit* 04/19/2015 2.5   Final   Lead Channel Status 04/19/2015    Final   Lead Channel Impedance Value 04/19/2015 400   Final   Lead Channel Sensing Intrinsic Amp* 04/19/2015 2.6   Final   Lead Channel Pacing Threshold Ampl* 04/19/2015 0.75   Final   Lead Channel Pacing Threshold Puls* 04/19/2015 0.4   Final   Lead Channel Status 04/19/2015    Final   Lead Channel Impedance Value 04/19/2015 530   Final   Lead Channel Sensing Intrinsic Amp* 04/19/2015 12   Final   Lead Channel Pacing Threshold Ampl* 04/19/2015 0.75   Final   Lead Channel Pacing Threshold Puls* 04/19/2015 0.4   Final   Battery Status 04/19/2015 MOS   Final   Battery Remaining Longevity 04/19/2015 106   Final   Battery Remaining Percentage 04/19/2015 82   Final   Battery Voltage 04/19/2015 2.96   Final    Brady Statistic RA Percent Paced 04/19/2015 3   Final   Brady Statistic RV Percent Paced 04/19/2015 33   Final   Brady Statistic AP VP Percent 04/19/2015 1.4   Final   Brady Statistic AS VP Percent 04/19/2015 31   Final   Brady Statistic AP VS Percent 04/19/2015 2.3   Final   Brady Statistic AS VS Percent 04/19/2015 64   Final   Eval Rhythm 04/19/2015 AsVs   Final   Miscellaneous Comment 04/19/2015    Final                   Value:Pacemaker remote check. Device function reviewed. Impedance, sensing, auto capture thresholds consistent with previous measurements. Histograms appropriate for patient and level of activity. All other diagnostic data reviewed and is appropriate and  stable for patient. Real time EGM shows appropriate sensing. 12 mode switches (<1% +xarelto)- longest 2:18:16, pk A 415, pk v 111. No ventricular high rate pisodes. Estimated longevity 8.4-9.3 years. Merlin 07/19/2015, ROV with SK in October.   Admission on 03/15/2015, Discharged on 03/15/2015  Component Date Value Ref Range Status   WBC 03/15/2015 7.5  4.0 - 10.5 K/uL Final   RBC 03/15/2015 4.25  4.22 - 5.81 MIL/uL Final   Hemoglobin 03/15/2015 13.0  13.0 - 17.0 g/dL Final   HCT 03/15/2015 39.9  39.0 - 52.0 % Final   MCV 03/15/2015 93.9  78.0 - 100.0 fL Final   MCH 03/15/2015 30.6  26.0 - 34.0 pg Final   MCHC 03/15/2015 32.6  30.0 - 36.0 g/dL Final   RDW 03/15/2015 13.5  11.5 - 15.5 % Final   Platelets 03/15/2015 142* 150 - 400 K/uL Final   Sodium 03/15/2015 140  135 - 145 mmol/L Final   Potassium 03/15/2015 4.3  3.5 - 5.1 mmol/L Final   Chloride 03/15/2015 106  96 - 112 mmol/L Final   CO2 03/15/2015 26  19 - 32 mmol/L Final   Glucose, Bld 03/15/2015 132* 70 - 99 mg/dL Final   BUN 03/15/2015 37* 6 - 23 mg/dL Final   Creatinine, Ser 03/15/2015 1.30  0.50 - 1.35 mg/dL Final   Calcium 03/15/2015 9.3  8.4 - 10.5 mg/dL Final   GFR calc non Af Amer 03/15/2015 47* >90 mL/min Final   GFR calc  Af Amer 03/15/2015 54* >90 mL/min Final   Comment: (NOTE) The eGFR has been calculated using the CKD EPI equation. This calculation has not been validated in all clinical situations. eGFR's persistently <90 mL/min signify possible Chronic Kidney Disease.    Anion gap 03/15/2015 8  5 - 15 Final   B Natriuretic Peptide 03/15/2015 213.8* 0.0 - 100.0 pg/mL Final   Troponin I 03/15/2015 <0.03  <0.031 ng/mL Final   Comment:        NO INDICATION OF MYOCARDIAL INJURY.   Abstract on 02/11/2015  Component Date Value Ref Range Status   Glucose 02/07/2015 119   Final   BUN 02/07/2015 30* 4 - 21 mg/dL Final   Creatinine 02/07/2015 1.2  0.6 - 1.3 mg/dL Final   Potassium 02/07/2015 4.3  3.4 - 5.3 mmol/L Final   Sodium 02/07/2015 137  137 - 147 mmol/L Final   Triglycerides 02/07/2015 115  40 - 160 mg/dL Final   Cholesterol 02/07/2015 132  0 - 200 mg/dL Final   HDL 02/07/2015 33* 35 - 70 mg/dL Final   LDL Cholesterol 02/07/2015 76   Final   Alkaline Phosphatase 02/07/2015 43  25 - 125 U/L Final   ALT 02/07/2015 10  10 - 40 U/L Final   AST 02/07/2015 21  14 - 40 U/L Final   Bilirubin, Total 02/07/2015 0.5   Final   Hgb A1c MFr Bld 02/07/2015 6.6* 4.0 - 6.0 % Final     Assessment/Plan  1. Failure to thrive in adult Family remains suspicious that some of his medications may contribute to his loss of appetite.  Multiple drugs were discontinued or held until his appointment next week.  Discontinued drugs include hydrocodone/APAP, methocarbamol, tramadol. The following drugs will be held until at least the next visit: Fosamax, Lipitor, Aricept, fenofibrate, furosemide, metformin, and Vesicare.  2. CKD stage 3 due to type 1 diabetes mellitus Discontinued furosemide. Encouraged additional fluid intake.  3. Midline low back pain without sciatica Continue with back brace. Try to do without pain medication  4. Chronic combined systolic and diastolic CHF (congestive heart  failure) Discontinued furosemide. Will remain on losartan  5. DM type 2 with diabetic peripheral neuropathy Discontinued metformin due to potential effect on appetite.  6. Essential hypertension Still running  on the low side. Discontinue furosemide.  7. Memory loss Unchanged  8. Vertebral compression fracture, with routine healing, subsequent encounter Continue with brace. Minimize pain medications.  9. Weak Increase solid food intake. Follow with boost. Resume physical therapy.  10. Weight loss Increase caloric intake. Several medications were discontinued or put on hold. Consider use of appetite stimulant at next visit if still having problems with oral intake.

## 2015-05-11 ENCOUNTER — Other Ambulatory Visit: Payer: Self-pay

## 2015-05-11 DIAGNOSIS — I5020 Unspecified systolic (congestive) heart failure: Secondary | ICD-10-CM | POA: Diagnosis not present

## 2015-05-11 MED ORDER — LOSARTAN POTASSIUM 25 MG PO TABS: 25.0000 mg | ORAL_TABLET | Freq: Every day | ORAL | Status: DC

## 2015-05-12 DIAGNOSIS — M6281 Muscle weakness (generalized): Secondary | ICD-10-CM | POA: Diagnosis not present

## 2015-05-12 DIAGNOSIS — F0390 Unspecified dementia without behavioral disturbance: Secondary | ICD-10-CM | POA: Diagnosis not present

## 2015-05-12 DIAGNOSIS — R262 Difficulty in walking, not elsewhere classified: Secondary | ICD-10-CM | POA: Diagnosis not present

## 2015-05-12 DIAGNOSIS — R627 Adult failure to thrive: Secondary | ICD-10-CM | POA: Diagnosis not present

## 2015-05-12 DIAGNOSIS — I10 Essential (primary) hypertension: Secondary | ICD-10-CM | POA: Diagnosis not present

## 2015-05-12 DIAGNOSIS — M545 Low back pain: Secondary | ICD-10-CM | POA: Diagnosis not present

## 2015-05-12 LAB — BASIC METABOLIC PANEL
BUN: 57 mg/dL — AB (ref 4–21)
CREATININE: 1.3 mg/dL (ref 0.6–1.3)
Glucose: 144 mg/dL
POTASSIUM: 3.9 mmol/L (ref 3.4–5.3)
Sodium: 136 mmol/L — AB (ref 137–147)

## 2015-05-12 LAB — HEPATIC FUNCTION PANEL
ALT: 13 U/L (ref 10–40)
AST: 24 U/L (ref 14–40)
Alkaline Phosphatase: 86 U/L (ref 25–125)
BILIRUBIN, TOTAL: 0.8 mg/dL

## 2015-05-13 ENCOUNTER — Encounter: Payer: Self-pay | Admitting: Cardiology

## 2015-05-13 DIAGNOSIS — M545 Low back pain: Secondary | ICD-10-CM | POA: Diagnosis not present

## 2015-05-13 DIAGNOSIS — M6281 Muscle weakness (generalized): Secondary | ICD-10-CM | POA: Diagnosis not present

## 2015-05-13 DIAGNOSIS — R262 Difficulty in walking, not elsewhere classified: Secondary | ICD-10-CM | POA: Diagnosis not present

## 2015-05-13 DIAGNOSIS — F0390 Unspecified dementia without behavioral disturbance: Secondary | ICD-10-CM | POA: Diagnosis not present

## 2015-05-16 DIAGNOSIS — M545 Low back pain: Secondary | ICD-10-CM | POA: Diagnosis not present

## 2015-05-16 DIAGNOSIS — R262 Difficulty in walking, not elsewhere classified: Secondary | ICD-10-CM | POA: Diagnosis not present

## 2015-05-16 DIAGNOSIS — M6281 Muscle weakness (generalized): Secondary | ICD-10-CM | POA: Diagnosis not present

## 2015-05-16 DIAGNOSIS — F0390 Unspecified dementia without behavioral disturbance: Secondary | ICD-10-CM | POA: Diagnosis not present

## 2015-05-17 ENCOUNTER — Non-Acute Institutional Stay: Payer: Medicare Other | Admitting: Internal Medicine

## 2015-05-17 ENCOUNTER — Encounter: Payer: Self-pay | Admitting: Internal Medicine

## 2015-05-17 VITALS — BP 144/78 | HR 80 | Temp 97.4°F

## 2015-05-17 DIAGNOSIS — M6281 Muscle weakness (generalized): Secondary | ICD-10-CM | POA: Diagnosis not present

## 2015-05-17 DIAGNOSIS — R634 Abnormal weight loss: Secondary | ICD-10-CM

## 2015-05-17 DIAGNOSIS — I10 Essential (primary) hypertension: Secondary | ICD-10-CM

## 2015-05-17 DIAGNOSIS — M545 Low back pain, unspecified: Secondary | ICD-10-CM

## 2015-05-17 DIAGNOSIS — M4850XD Collapsed vertebra, not elsewhere classified, site unspecified, subsequent encounter for fracture with routine healing: Secondary | ICD-10-CM

## 2015-05-17 DIAGNOSIS — R627 Adult failure to thrive: Secondary | ICD-10-CM

## 2015-05-17 DIAGNOSIS — R262 Difficulty in walking, not elsewhere classified: Secondary | ICD-10-CM | POA: Diagnosis not present

## 2015-05-17 DIAGNOSIS — F0390 Unspecified dementia without behavioral disturbance: Secondary | ICD-10-CM | POA: Diagnosis not present

## 2015-05-17 DIAGNOSIS — IMO0001 Reserved for inherently not codable concepts without codable children: Secondary | ICD-10-CM

## 2015-05-17 MED ORDER — FENTANYL 25 MCG/HR TD PT72: MEDICATED_PATCH | TRANSDERMAL | Status: DC

## 2015-05-17 MED ORDER — MEGESTROL ACETATE 625 MG/5ML PO SUSP: ORAL | Status: DC

## 2015-05-17 NOTE — Progress Notes (Signed)
Patient ID: Zachary Harris, male   DOB: 1923/01/22, 81 y.o.   MRN: [id number]    [facility]    Place of Service: Clinic (12) OFFICE    Allergies  Allergen Reactions   Hydrocodone     Chief Complaint  Patient presents with   Medical Management of Chronic Issues    CHF, back pain, failure to thrive, memory. Got up feeling ok, then back started hurting. Here with son Zachary Harris    HPI:  Failure to thrive in adult: continues with poor intake. Multiple drugs were stopped last visit in hopes that his appetite would improve. He continues to drink his boost 2-3 cans daily, but is refusing other solid foods and has been offered by the family. He sleeps 18-20 hours per day according to the family history. In regards to his oral intake, patient says he is not having trouble with dysphagia or nausea. There are no taste changes. He just doesn't feel hungry.  Essential hypertension: Stable off medications that were stopped last time.  Midline low back pain without sciatica: Back pains are increasing and are immobilizing at times. He continues to wear his brace. Family has been having him wear it at night as well as during the day.  Vertebral compression fracture, with routine healing, subsequent encounter: Continues to use brace. Complaining of back pain.  Weight loss: Weight was not obtained today. Patient in wheelchair.    Medications: Patient's Medications  New Prescriptions   No medications on file  Previous Medications   ALENDRONATE (FOSAMAX) 70 MG TABLET    TAKE 1 TAB ONCE A WK AT LEAST 30 MIN BEFORE 1ST FOOD.DO NOT LIE DOWN FOR 30 MIN AFTER TAKING.   ATORVASTATIN (LIPITOR) 40 MG TABLET    TAKE 1 TABLET ONCE A DAY TO CONTROL CHOLESTEROL.   DOCUSATE SODIUM (COLACE) 100 MG CAPSULE    Take 1 capsule (100 mg total) by mouth every 12 (twelve) hours.   DONEPEZIL (ARICEPT) 10 MG TABLET    Take one tablet daily   FEEDING SUPPLEMENT, GLUCERNA SHAKE, (GLUCERNA SHAKE) LIQD     Take 237 mLs by mouth 2 (two) times daily between meals.   FENOFIBRATE 160 MG TABLET    TAKE 1 TABLET ONCE A DAY TO CONTROL CHOLESTEROL.   FUROSEMIDE (LASIX) 20 MG TABLET    Take 1 tablet (20 mg total) by mouth every other day.   LOSARTAN (COZAAR) 25 MG TABLET    Take 1 tablet (25 mg total) by mouth daily.   MEMANTINE (NAMENDA XR) 28 MG CP24 24 HR CAPSULE    Take 1 capsule (28 mg total) by mouth daily.   METFORMIN (GLUCOPHAGE) 500 MG TABLET    TAKE 1 TABLET EACH MORNING TO CONTROL GLUCOSE.   NITROGLYCERIN (NITROSTAT) 0.4 MG SL TABLET    Place 0.4 mg under the tongue every 5 (five) minutes as needed for chest pain.   RIVAROXABAN (XARELTO) 15 MG TABS TABLET    Take 1 tablet (15 mg total) by mouth daily with supper.   SOLIFENACIN (VESICARE) 10 MG TABLET    Take 10 mg by mouth daily.   TAMSULOSIN (FLOMAX) 0.4 MG CAPS CAPSULE    Take 0.4 mg by mouth daily after breakfast. For bladder  Modified Medications   No medications on file  Discontinued Medications   HYDROCODONE-ACETAMINOPHEN (NORCO/VICODIN) 5-325 MG PER TABLET    Take 1 tablet by mouth every 4 (four) hours as needed for moderate pain.   METHOCARBAMOL (ROBAXIN) 500 MG  TABLET    Take 1 tablet (500 mg total) by mouth every 8 (eight) hours as needed for muscle spasms.   TRAMADOL (ULTRAM) 50 MG TABLET    Take one tablet every 6 hours as needed for pain     Review of Systems  Constitutional: Negative for fever, chills, activity change, appetite change, fatigue and unexpected weight change.  HENT: Positive for hearing loss. Negative for ear pain and sinus pressure.   Eyes: Negative for photophobia, pain and visual disturbance.       Corrective lenses  Respiratory: Positive for shortness of breath. Negative for cough, chest tightness and wheezing.   Cardiovascular: Positive for leg swelling.       Pacemaker. History of syncope related to cardiac arrhythmia.  Gastrointestinal: Negative for abdominal pain and abdominal distention.       Poor  appetite  Endocrine: Negative for cold intolerance, heat intolerance, polydipsia, polyphagia and polyuria.       History of diabetes.  Genitourinary: Positive for urgency and frequency. Negative for dysuria and flank pain.       Urge incontinence. Increased frequency which has not responded well to [place]. Continues to see urologist. Episodes of incontinence persist.  Musculoskeletal: Positive for myalgias, back pain, arthralgias, gait problem and neck pain (Fall on 03/15/15 causing pain in the left knee and effusion.).       Poor balance and gait disturbance. Able to stand without assistance.  Skin:       Rough area on left cheek  Allergic/Immunologic: Negative.   Neurological: Negative for tremors, seizures, facial asymmetry and weakness.       Memory loss  Hematological: Negative.   Psychiatric/Behavioral: Positive for confusion.       Mild to moderate memory disturbance.    Filed Vitals:   05/17/15 1032  BP: 144/78  Pulse: 80  Temp: 97.4 F (36.3 C)  TempSrc: Oral  SpO2: 96%   There is no weight on file to calculate BMI.  Physical Exam  Constitutional: He is oriented to person, place, and time.  Frail, cachectic  HENT:  Head: Normocephalic and atraumatic.  Nose: Nose normal.  Bilateral hearing aids.  Eyes: Conjunctivae are normal. Pupils are equal, round, and reactive to light.  Prescription lenses.  Neck: Normal range of motion. Neck supple. No JVD present. No tracheal deviation present. No thyromegaly present.  Cardiovascular: Normal rate and regular rhythm.  Exam reveals no gallop and no friction rub.   Murmur heard.  Systolic murmur is present with a grade of 1/6  Absent DP and PT  Pulmonary/Chest: Effort normal and breath sounds normal. No respiratory distress. He has no wheezes. He has no rales. He exhibits no tenderness.  Using portable oxygen at 2 L/m today.  Abdominal: Soft. Bowel sounds are normal. He exhibits no distension and no mass. There is no tenderness.   Musculoskeletal: Normal range of motion. He exhibits edema. He exhibits no tenderness.  Pain in the left costovertebral area. Wearing rigid back brace.  Lymphadenopathy:    He has no cervical adenopathy.  Neurological: He is alert and oriented to person, place, and time. He has normal strength. A sensory deficit is present. No cranial nerve deficit. Gait abnormal.  07/13/14 MMSE 28/30. Passed clock drawing. Diminshed sensation to vibration and monofilament.  Skin: Skin is warm and dry. No erythema. No pallor.  Rough area on the left cheek. Cystic lesion in the mid forehead.  Psychiatric: He has a normal mood and affect. His behavior is normal.  Thought content normal.  Patient is in denial regarding the extent of his memory deficit.     Labs reviewed: Nursing Home on 05/17/2015  Component Date Value Ref Range Status   Glucose 05/12/2015 144   Final   BUN 05/12/2015 57* 4 - 21 mg/dL Final   Creatinine 05/12/2015 1.3  0.6 - 1.3 mg/dL Final   Potassium 05/12/2015 3.9  3.4 - 5.3 mmol/L Final   Sodium 05/12/2015 136* 137 - 147 mmol/L Final   Alkaline Phosphatase 05/12/2015 86  25 - 125 U/L Final   ALT 05/12/2015 13  10 - 40 U/L Final   AST 05/12/2015 24  14 - 40 U/L Final   Bilirubin, Total 05/12/2015 0.8   Final  Nursing Home on 05/03/2015  Component Date Value Ref Range Status   WBC 05/03/2015 9.6  3.4 - 10.8 x10E3/uL Final   RBC 05/03/2015 4.53  4.14 - 5.80 x10E6/uL Final   Hemoglobin 05/03/2015 13.9  12.6 - 17.7 g/dL Final   Hematocrit 05/03/2015 42.1  37.5 - 51.0 % Final   MCV 05/03/2015 93  79 - 97 fL Final   MCH 05/03/2015 30.7  26.6 - 33.0 pg Final   MCHC 05/03/2015 33.0  31.5 - 35.7 g/dL Final   RDW 05/03/2015 14.2  12.3 - 15.4 % Final   NEUTROPHILS 05/03/2015 71   Final   Lymphs 05/03/2015 19   Final   Monocytes 05/03/2015 7   Final   Eos 05/03/2015 3   Final   Basos 05/03/2015 0   Final   Neutrophils Absolute 05/03/2015 6.8  1.4 - 7.0 x10E3/uL  Final   Lymphocytes Absolute 05/03/2015 1.8  0.7 - 3.1 x10E3/uL Final   Monocytes Absolute 05/03/2015 0.7  0.1 - 0.9 x10E3/uL Final   EOS (ABSOLUTE) 05/03/2015 0.3  0.0 - 0.4 x10E3/uL Final   Basophils Absolute 05/03/2015 0.0  0.0 - 0.2 x10E3/uL Final   Immature Granulocytes 05/03/2015 0   Final   Immature Grans (Abs) 05/03/2015 0.0  0.0 - 0.1 x10E3/uL Final   Glucose 05/03/2015 205* 65 - 99 mg/dL Final   BUN 05/03/2015 51* 8 - 27 mg/dL Final   Creatinine, Ser 05/03/2015 1.39* 0.76 - 1.27 mg/dL Final   GFR calc non Af Amer 05/03/2015 45* >59 mL/min/1.73 Final   GFR calc Af Amer 05/03/2015 52* >59 mL/min/1.73 Final   BUN/Creatinine Ratio 05/03/2015 37* 10 - 22 Final   Sodium 05/03/2015 141  134 - 144 mmol/L Final   Potassium 05/03/2015 4.1  3.5 - 5.2 mmol/L Final   Chloride 05/03/2015 98  97 - 108 mmol/L Final   CO2 05/03/2015 26  18 - 29 mmol/L Final   Calcium 05/03/2015 10.0  8.6 - 10.2 mg/dL Final   Total Protein 05/03/2015 7.1  6.0 - 8.5 g/dL Final   Albumin 05/03/2015 3.9  3.5 - 4.7 g/dL Final   Globulin, Total 05/03/2015 3.2  1.5 - 4.5 g/dL Final   Albumin/Globulin Ratio 05/03/2015 1.2  1.1 - 2.5 Final   Bilirubin Total 05/03/2015 0.8  0.0 - 1.2 mg/dL Final   Alkaline Phosphatase 05/03/2015 66  39 - 117 IU/L Final   AST 05/03/2015 21  0 - 40 IU/L Final   ALT 05/03/2015 11  0 - 44 IU/L Final   Specific Gravity, UA 05/03/2015 1.025  1.005 - 1.030 Final   pH, UA 05/03/2015 5.5  5.0 - 7.5 Final   Color, UA 05/03/2015 Yellow  Yellow Final   Appearance Ur 05/03/2015 Clear  Clear Final  Leukocytes, UA 05/03/2015 Negative  Negative Final   Protein, UA 05/03/2015 Negative  Negative/Trace Final   Glucose, UA 05/03/2015 Negative  Negative Final   Ketones, UA 05/03/2015 Negative  Negative Final   RBC, UA 05/03/2015 Negative  Negative Final   Bilirubin, UA 05/03/2015 Negative  Negative Final   Urobilinogen, Ur 05/03/2015 1.0  0.2 - 1.0 mg/dL Final    Nitrite, UA 05/03/2015 Negative  Negative Final  Clinical Support on 04/19/2015  Component Date Value Ref Range Status   Date Time Interrogation Session 04/19/2015 [id number]   Final   Implantable Pulse Generator Manufa* 04/19/2015 [employer]   Final   Implantable Pulse Generator Model 04/19/2015 [address] RF   Final   Implantable Pulse Generator Serial* 04/19/2015 [id number]   Final   Lead Channel Setting Sensing Sensi* 04/19/2015 2   Final   Lead Channel Setting Sensing Adapt* 04/19/2015 Fixed Pacing   Final   Lead Channel Setting Pacing Amplit* 04/19/2015 2   Final   Lead Channel Setting Pacing Pulse * 04/19/2015 0.4   Final   Lead Channel Setting Pacing Amplit* 04/19/2015 2.5   Final   Lead Channel Status 04/19/2015    Final   Lead Channel Impedance Value 04/19/2015 400   Final   Lead Channel Sensing Intrinsic Amp* 04/19/2015 2.6   Final   Lead Channel Pacing Threshold Ampl* 04/19/2015 0.75   Final   Lead Channel Pacing Threshold Puls* 04/19/2015 0.4   Final   Lead Channel Status 04/19/2015    Final   Lead Channel Impedance Value 04/19/2015 530   Final   Lead Channel Sensing Intrinsic Amp* 04/19/2015 12   Final   Lead Channel Pacing Threshold Ampl* 04/19/2015 0.75   Final   Lead Channel Pacing Threshold Puls* 04/19/2015 0.4   Final   Battery Status 04/19/2015 MOS   Final   Battery Remaining Longevity 04/19/2015 106   Final   Battery Remaining Percentage 04/19/2015 82   Final   Battery Voltage 04/19/2015 2.96   Final   Brady Statistic RA Percent Paced 04/19/2015 3   Final   Brady Statistic RV Percent Paced 04/19/2015 33   Final   Brady Statistic AP VP Percent 04/19/2015 1.4   Final   Brady Statistic AS VP Percent 04/19/2015 31   Final   Brady Statistic AP VS Percent 04/19/2015 2.3   Final   Brady Statistic AS VS Percent 04/19/2015 64   Final   Eval Rhythm 04/19/2015 AsVs   Final   Miscellaneous Comment 04/19/2015    Final                    Value:Pacemaker remote check. Device function reviewed. Impedance, sensing, auto capture thresholds consistent with previous measurements. Histograms appropriate for patient and level of activity. All other diagnostic data reviewed and is appropriate and  stable for patient. Real time EGM shows appropriate sensing. 12 mode switches (<1% +xarelto)- longest 2:18:16, pk A 415, pk v 111. No ventricular high rate pisodes. Estimated longevity 8.4-9.3 years. Merlin 07/19/2015, ROV with SK in October.   Admission on 03/15/2015, Discharged on 03/15/2015  Component Date Value Ref Range Status   WBC 03/15/2015 7.5  4.0 - 10.5 K/uL Final   RBC 03/15/2015 4.25  4.22 - 5.81 MIL/uL Final   Hemoglobin 03/15/2015 13.0  13.0 - 17.0 g/dL Final   HCT 03/15/2015 39.9  39.0 - 52.0 % Final   MCV 03/15/2015 93.9  78.0 - 100.0 fL Final  MCH 03/15/2015 30.6  26.0 - 34.0 pg Final   MCHC 03/15/2015 32.6  30.0 - 36.0 g/dL Final   RDW 03/15/2015 13.5  11.5 - 15.5 % Final   Platelets 03/15/2015 142* 150 - 400 K/uL Final   Sodium 03/15/2015 140  135 - 145 mmol/L Final   Potassium 03/15/2015 4.3  3.5 - 5.1 mmol/L Final   Chloride 03/15/2015 106  96 - 112 mmol/L Final   CO2 03/15/2015 26  19 - 32 mmol/L Final   Glucose, Bld 03/15/2015 132* 70 - 99 mg/dL Final   BUN 03/15/2015 37* 6 - 23 mg/dL Final   Creatinine, Ser 03/15/2015 1.30  0.50 - 1.35 mg/dL Final   Calcium 03/15/2015 9.3  8.4 - 10.5 mg/dL Final   GFR calc non Af Amer 03/15/2015 47* >90 mL/min Final   GFR calc Af Amer 03/15/2015 54* >90 mL/min Final   Comment: (NOTE) The eGFR has been calculated using the CKD EPI equation. This calculation has not been validated in all clinical situations. eGFR's persistently <90 mL/min signify possible Chronic Kidney Disease.    Anion gap 03/15/2015 8  5 - 15 Final   B Natriuretic Peptide 03/15/2015 213.8* 0.0 - 100.0 pg/mL Final   Troponin I 03/15/2015 <0.03  <0.031 ng/mL Final   Comment:         NO INDICATION OF MYOCARDIAL INJURY.      Assessment/Plan  1. Failure to thrive in adult - megestrol (MEGACE ES) 625 MG/5ML suspension; 5 cc daily to stimulate appetite  Dispense: 150 mL; Refill: 3  2. Essential hypertension Controlled adequately. No change in medications.  3. Midline low back pain without sciatica - fentaNYL (DURAGESIC) 25 MCG/HR patch; Apply fresh patch every 3 days to help pain. Remove old patch.  Dispense: 5 patch; Refill: 0  4. Vertebral compression fracture, with routine healing, subsequent encounter - fentaNYL (DURAGESIC) 25 MCG/HR patch; Apply fresh patch every 3 days to help pain. Remove old patch.  Dispense: 5 patch; Refill: 0  5. Weight loss - megestrol (MEGACE ES) 625 MG/5ML suspension; 5 cc daily to stimulate appetite  Dispense: 150 mL; Refill: 3

## 2015-05-18 ENCOUNTER — Telehealth: Payer: Self-pay | Admitting: *Deleted

## 2015-05-18 NOTE — Telephone Encounter (Signed)
Please continue the PT

## 2015-05-18 NOTE — Telephone Encounter (Signed)
[name], daughter called and stated that they saw you last week and you recommended for patient to have PT. That was started. They saw you yesterday and they stated you didn't say anything about his PT and they want to know if you wanted him to continue this due to his back pain. Please Advise.

## 2015-05-18 NOTE — Telephone Encounter (Signed)
Patient daughter notified and agreed.  

## 2015-05-20 ENCOUNTER — Encounter (HOSPITAL_COMMUNITY): Payer: Self-pay | Admitting: Physical Medicine and Rehabilitation

## 2015-05-20 ENCOUNTER — Inpatient Hospital Stay (HOSPITAL_COMMUNITY)
Admission: EM | Admit: 2015-05-20 | Discharge: 2015-05-23 | DRG: 640 | Disposition: A | Discharge status: Hospice | Payer: Medicare Other | Attending: Internal Medicine | Admitting: Internal Medicine

## 2015-05-20 ENCOUNTER — Emergency Department (HOSPITAL_COMMUNITY): Payer: Medicare Other

## 2015-05-20 DIAGNOSIS — M4854XA Collapsed vertebra, not elsewhere classified, thoracic region, initial encounter for fracture: Secondary | ICD-10-CM | POA: Diagnosis not present

## 2015-05-20 DIAGNOSIS — M810 Age-related osteoporosis without current pathological fracture: Secondary | ICD-10-CM | POA: Diagnosis present

## 2015-05-20 DIAGNOSIS — E861 Hypovolemia: Secondary | ICD-10-CM | POA: Diagnosis present

## 2015-05-20 DIAGNOSIS — IMO0001 Reserved for inherently not codable concepts without codable children: Secondary | ICD-10-CM

## 2015-05-20 DIAGNOSIS — N179 Acute kidney failure, unspecified: Secondary | ICD-10-CM | POA: Diagnosis present

## 2015-05-20 DIAGNOSIS — IMO0002 Reserved for concepts with insufficient information to code with codable children: Secondary | ICD-10-CM

## 2015-05-20 DIAGNOSIS — F0390 Unspecified dementia without behavioral disturbance: Secondary | ICD-10-CM | POA: Diagnosis present

## 2015-05-20 DIAGNOSIS — Z955 Presence of coronary angioplasty implant and graft: Secondary | ICD-10-CM

## 2015-05-20 DIAGNOSIS — Z7901 Long term (current) use of anticoagulants: Secondary | ICD-10-CM

## 2015-05-20 DIAGNOSIS — E1129 Type 2 diabetes mellitus with other diabetic kidney complication: Secondary | ICD-10-CM | POA: Diagnosis not present

## 2015-05-20 DIAGNOSIS — Z87891 Personal history of nicotine dependence: Secondary | ICD-10-CM | POA: Diagnosis not present

## 2015-05-20 DIAGNOSIS — M4854XD Collapsed vertebra, not elsewhere classified, thoracic region, subsequent encounter for fracture with routine healing: Secondary | ICD-10-CM | POA: Diagnosis not present

## 2015-05-20 DIAGNOSIS — R32 Unspecified urinary incontinence: Secondary | ICD-10-CM | POA: Diagnosis present

## 2015-05-20 DIAGNOSIS — I255 Ischemic cardiomyopathy: Secondary | ICD-10-CM | POA: Diagnosis present

## 2015-05-20 DIAGNOSIS — I2510 Atherosclerotic heart disease of native coronary artery without angina pectoris: Secondary | ICD-10-CM | POA: Diagnosis present

## 2015-05-20 DIAGNOSIS — I5022 Chronic systolic (congestive) heart failure: Secondary | ICD-10-CM | POA: Diagnosis present

## 2015-05-20 DIAGNOSIS — Z950 Presence of cardiac pacemaker: Secondary | ICD-10-CM

## 2015-05-20 DIAGNOSIS — R000 Tachycardia, unspecified: Secondary | ICD-10-CM | POA: Diagnosis not present

## 2015-05-20 DIAGNOSIS — R531 Weakness: Secondary | ICD-10-CM

## 2015-05-20 DIAGNOSIS — M549 Dorsalgia, unspecified: Secondary | ICD-10-CM | POA: Diagnosis present

## 2015-05-20 DIAGNOSIS — E860 Dehydration: Secondary | ICD-10-CM | POA: Diagnosis not present

## 2015-05-20 DIAGNOSIS — M4850XD Collapsed vertebra, not elsewhere classified, site unspecified, subsequent encounter for fracture with routine healing: Secondary | ICD-10-CM

## 2015-05-20 DIAGNOSIS — M545 Low back pain, unspecified: Secondary | ICD-10-CM

## 2015-05-20 DIAGNOSIS — T148 Other injury of unspecified body region: Secondary | ICD-10-CM | POA: Diagnosis not present

## 2015-05-20 DIAGNOSIS — E43 Unspecified severe protein-calorie malnutrition: Secondary | ICD-10-CM | POA: Diagnosis present

## 2015-05-20 DIAGNOSIS — Z951 Presence of aortocoronary bypass graft: Secondary | ICD-10-CM | POA: Diagnosis not present

## 2015-05-20 DIAGNOSIS — I480 Paroxysmal atrial fibrillation: Secondary | ICD-10-CM

## 2015-05-20 DIAGNOSIS — H9190 Unspecified hearing loss, unspecified ear: Secondary | ICD-10-CM | POA: Diagnosis present

## 2015-05-20 DIAGNOSIS — M4850XA Collapsed vertebra, not elsewhere classified, site unspecified, initial encounter for fracture: Secondary | ICD-10-CM | POA: Diagnosis present

## 2015-05-20 DIAGNOSIS — Z79899 Other long term (current) drug therapy: Secondary | ICD-10-CM | POA: Diagnosis not present

## 2015-05-20 DIAGNOSIS — Z96653 Presence of artificial knee joint, bilateral: Secondary | ICD-10-CM | POA: Diagnosis present

## 2015-05-20 DIAGNOSIS — R627 Adult failure to thrive: Secondary | ICD-10-CM | POA: Diagnosis not present

## 2015-05-20 DIAGNOSIS — I10 Essential (primary) hypertension: Secondary | ICD-10-CM | POA: Diagnosis present

## 2015-05-20 DIAGNOSIS — M6258 Muscle wasting and atrophy, not elsewhere classified, other site: Secondary | ICD-10-CM | POA: Diagnosis present

## 2015-05-20 DIAGNOSIS — Z66 Do not resuscitate: Secondary | ICD-10-CM | POA: Diagnosis present

## 2015-05-20 DIAGNOSIS — Z515 Encounter for palliative care: Secondary | ICD-10-CM | POA: Insufficient documentation

## 2015-05-20 DIAGNOSIS — R296 Repeated falls: Secondary | ICD-10-CM | POA: Diagnosis not present

## 2015-05-20 DIAGNOSIS — E0841 Diabetes mellitus due to underlying condition with diabetic mononeuropathy: Secondary | ICD-10-CM | POA: Diagnosis not present

## 2015-05-20 HISTORY — DX: Weakness: R53.1

## 2015-05-20 LAB — CBC WITH DIFFERENTIAL/PLATELET
BASOS ABS: 0 10*3/uL (ref 0.0–0.1)
BASOS PCT: 0 % (ref 0–1)
EOS PCT: 3 % (ref 0–5)
Eosinophils Absolute: 0.3 10*3/uL (ref 0.0–0.7)
HEMATOCRIT: 39 % (ref 39.0–52.0)
Hemoglobin: 13.1 g/dL (ref 13.0–17.0)
LYMPHS ABS: 1.4 10*3/uL (ref 0.7–4.0)
Lymphocytes Relative: 17 % (ref 12–46)
MCH: 30.5 pg (ref 26.0–34.0)
MCHC: 33.6 g/dL (ref 30.0–36.0)
MCV: 90.9 fL (ref 78.0–100.0)
MONOS PCT: 8 % (ref 3–12)
Monocytes Absolute: 0.7 10*3/uL (ref 0.1–1.0)
Neutro Abs: 5.9 10*3/uL (ref 1.7–7.7)
Neutrophils Relative %: 72 % (ref 43–77)
PLATELETS: 182 10*3/uL (ref 150–400)
RBC: 4.29 MIL/uL (ref 4.22–5.81)
RDW: 14.2 % (ref 11.5–15.5)
WBC: 8.3 10*3/uL (ref 4.0–10.5)

## 2015-05-20 LAB — COMPREHENSIVE METABOLIC PANEL
ALK PHOS: 122 U/L (ref 38–126)
ALT: 19 U/L (ref 17–63)
AST: 35 U/L (ref 15–41)
Albumin: 3.1 g/dL — ABNORMAL LOW (ref 3.5–5.0)
Anion gap: 10 (ref 5–15)
BILIRUBIN TOTAL: 0.9 mg/dL (ref 0.3–1.2)
BUN: 64 mg/dL — ABNORMAL HIGH (ref 6–20)
CHLORIDE: 104 mmol/L (ref 101–111)
CO2: 23 mmol/L (ref 22–32)
Calcium: 9.5 mg/dL (ref 8.9–10.3)
Creatinine, Ser: 1.67 mg/dL — ABNORMAL HIGH (ref 0.61–1.24)
GFR calc non Af Amer: 35 mL/min — ABNORMAL LOW (ref >60)
GFR, EST AFRICAN AMERICAN: 40 mL/min — AB (ref >60)
Glucose, Bld: 149 mg/dL — ABNORMAL HIGH (ref 65–99)
Potassium: 3.8 mmol/L (ref 3.5–5.1)
SODIUM: 137 mmol/L (ref 135–145)
Total Protein: 6.8 g/dL (ref 6.5–8.1)

## 2015-05-20 LAB — URINALYSIS, ROUTINE W REFLEX MICROSCOPIC
Bilirubin Urine: NEGATIVE
Glucose, UA: NEGATIVE mg/dL
Hgb urine dipstick: NEGATIVE
KETONES UR: NEGATIVE mg/dL
LEUKOCYTES UA: NEGATIVE
Nitrite: NEGATIVE
Protein, ur: NEGATIVE mg/dL
Specific Gravity, Urine: 1.022 (ref 1.005–1.030)
Urobilinogen, UA: 0.2 mg/dL (ref 0.0–1.0)
pH: 5 (ref 5.0–8.0)

## 2015-05-20 LAB — I-STAT TROPONIN, ED: Troponin i, poc: 0.05 ng/mL (ref 0.00–0.08)

## 2015-05-20 LAB — GLUCOSE, CAPILLARY: Glucose-Capillary: 111 mg/dL — ABNORMAL HIGH (ref 65–99)

## 2015-05-20 LAB — TSH: TSH: 1.574 u[IU]/mL (ref 0.350–4.500)

## 2015-05-20 MED ORDER — ONDANSETRON HCL 4 MG/2ML IJ SOLN
4.0000 mg | Freq: Four times a day (QID) | INTRAMUSCULAR | Status: DC | PRN
Start: 2015-05-20 — End: 2015-05-23

## 2015-05-20 MED ORDER — INSULIN ASPART 100 UNIT/ML ~~LOC~~ SOLN
0.0000 [IU] | Freq: Three times a day (TID) | SUBCUTANEOUS | Status: DC
  Administered 2015-05-21: 3 [IU] via SUBCUTANEOUS
  Administered 2015-05-21: 2 [IU] via SUBCUTANEOUS
  Administered 2015-05-23: 5 [IU] via SUBCUTANEOUS

## 2015-05-20 MED ORDER — MEMANTINE HCL ER 28 MG PO CP24
28.0000 mg | ORAL_CAPSULE | Freq: Every day | ORAL | Status: DC
  Administered 2015-05-20 – 2015-05-23 (×4): 28 mg via ORAL
  Filled 2015-05-20 (×4): qty 1

## 2015-05-20 MED ORDER — INSULIN ASPART 100 UNIT/ML ~~LOC~~ SOLN: 0.0000 [IU] | Freq: Every day | SUBCUTANEOUS | Status: DC

## 2015-05-20 MED ORDER — ACETAMINOPHEN 500 MG PO TABS
1000.0000 mg | ORAL_TABLET | Freq: Two times a day (BID) | ORAL | Status: DC
  Administered 2015-05-20 – 2015-05-23 (×6): 1000 mg via ORAL
  Filled 2015-05-20 (×6): qty 2

## 2015-05-20 MED ORDER — RIVAROXABAN 15 MG PO TABS
15.0000 mg | ORAL_TABLET | Freq: Every day | ORAL | Status: DC
  Administered 2015-05-20 – 2015-05-22 (×3): 15 mg via ORAL
  Filled 2015-05-20 (×3): qty 1

## 2015-05-20 MED ORDER — TAMSULOSIN HCL 0.4 MG PO CAPS
0.4000 mg | ORAL_CAPSULE | Freq: Every day | ORAL | Status: DC
  Administered 2015-05-21 – 2015-05-23 (×3): 0.4 mg via ORAL
  Filled 2015-05-20 (×2): qty 1

## 2015-05-20 MED ORDER — SODIUM CHLORIDE 0.9 % IV SOLN
INTRAVENOUS | Status: DC
  Administered 2015-05-20 – 2015-05-22 (×2): 75 mL/h via INTRAVENOUS

## 2015-05-20 MED ORDER — SODIUM CHLORIDE 0.9 % IV BOLUS (SEPSIS)
250.0000 mL | Freq: Once | INTRAVENOUS | Status: AC
  Administered 2015-05-20: 250 mL via INTRAVENOUS

## 2015-05-20 MED ORDER — FENTANYL 25 MCG/HR TD PT72
25.0000 ug | MEDICATED_PATCH | TRANSDERMAL | Status: DC
  Administered 2015-05-21: 25 ug via TRANSDERMAL
  Filled 2015-05-20: qty 1

## 2015-05-20 MED ORDER — ONDANSETRON HCL 4 MG PO TABS: 4.0000 mg | ORAL_TABLET | Freq: Four times a day (QID) | ORAL | Status: DC | PRN

## 2015-05-20 NOTE — ED Notes (Signed)
pts daughter states pt saw a doctor today who advised them to bring him here. Daughter states that pt was dx with a compression fracture in April and he has been declining since then, not eating and mostly staying in bed. She reports that pt has not been eating well and they have tried pain pills and pain patches with no relief. Pt was started on an appetite stimulant yesterday, is having chronic pain, pt is drinking boosts shakes at home to help with appetite. Pt presents with sore to left leg that has been present for atleast a week, his daughter is unsure how long. Pt alert, oriented x 4, nad.

## 2015-05-20 NOTE — ED Provider Notes (Signed)
CSN: [id number]     Arrival date & time 05/20/15  1244 History   First MD Initiated Contact with Patient 05/20/15 1513     Chief Complaint  Patient presents with  . Failure To Thrive     The history is provided by a relative. No language interpreter was used.   Zachary Harris presents for evaluation of failure to thrive. Hx is provided by his daughter.  Level V caveat due to dementia and hard of hearing.  Since October patient has had progressive decline. He was admitted to the hospital for CHF and has progressively worsen some time. He had a fall back in March and sustained a compression fracture to his back and late April. Since that time he has had uncontrolled back pain and decreased energy, appetite and progressive weakness. Family reports that he has minimal oral intake at this time. He may take in 2-3 loose in a day. He's had increased aggression towards family members when it comes to hygiene. He currently lives in an apartment alone and has family and caregiver come to check on him. Symptoms are severe, constant, worsening.  Past Medical History  Diagnosis Date  . ANEMIA-IRON DEFICIENCY 05/27/2007  . ANXIETY 11/14/2007  . Intermittent high-grade heart block 09/05/2009    s/p pacemaker  . BENIGN PROSTATIC HYPERTROPHY 11/14/2007  . CAROTID ARTERY DISEASE 10/02/2010  . Pacemaker-St. Jude 02/02/2008    St. Jude  . COLONIC POLYPS, HX OF 05/27/2007  . CORONARY ARTERY DISEASE 11/14/2007    prior bypass [phone]; Inferior MI 11/07; Severe 3 vessel disease; occluded SVG to RCA; SVG to OM subtotalled, SVG to diagonal with ostial 70 and 80 mid; EF 45. PCI of SVG to OM. FU myovue showed EF 56 with inferior infarct and very mild peri-infarct ischemia  . DEPRESSION 11/14/2007  . DIABETES MELLITUS, TYPE II 11/14/2007  . DIVERTICULITIS, HX OF 05/27/2007  . DVT (deep venous thrombosis) 03/21/2011  . FOOT PAIN, BILATERAL 12/08/2010  . GERD 11/14/2007  . HYPERLIPIDEMIA 11/14/2007  . HYPERTENSION 11/14/2007   . HYPOTENSION, ORTHOSTATIC 03/06/2011  . Memory loss 06/01/2008  . OSTEOPOROSIS 06/02/2010  . PERIPHERAL VASCULAR DISEASE 11/14/2007  . RENAL INSUFFICIENCY 11/14/2007  . SECONDARY DM W/PERIPHERAL CIRC D/O UNCONTROLLED 11/07/2010  . SYNCOPE 09/08/2008  . Unspecified hearing loss 05/13/2009  . Prostate cancer   . Myalgia and myositis, unspecified 04/24/2011  . Other malaise and fatigue 04/24/2011  . Peripheral neuropathy 03/27/2011  . Seborrheic keratosis 06/05/2011  . Osteoarthrosis, unspecified whether generalized or localized, unspecified site   . Ischemic cardiomyopathy     a. echo (10/15):  mod focal basal septal hypertrophy, EF 40-45%, inf AK, Gr 1 DD, Al sclerosis, mild AI, MAC, mild MR, mod LAE, lipomatous hypertrophy, small effusion (no hemodynamic compromise)   . Effusion of left knee 03/17/2015  . Severe back pain   . DM (diabetes mellitus), type 2 with renal complications 2/95/6213   Past Surgical History  Procedure Laterality Date  . S/p coronary stent x 1    . Cholecystectomy    . S/p bilat knee replacement  Cleon Gustin, MD  . Pacemaker insertion  2012    Caryl Comes, MD  . Prostate needle biopsy    . Cystoscopy    . Appendectomy    . Coronary artery bypass graft  2007   Family History  Problem Relation Age of Onset  . Diabetes Father     56  . Cancer Mother    History  Substance Use  Topics  . Smoking status: Former Smoker -- 1.00 packs/day for 50 years    Types: Cigarettes    Quit date: 10/18/1983  . Smokeless tobacco: Never Used  . Alcohol Use: No    Review of Systems  All other systems reviewed and are negative.     Allergies  Hydrocodone  Home Medications   Prior to Admission medications   Medication Sig Start Date End Date Taking? Authorizing Provider  alendronate (FOSAMAX) 70 MG tablet TAKE 1 TAB ONCE A WK AT LEAST 30 MIN BEFORE 1ST FOOD.DO NOT LIE DOWN FOR 30 MIN AFTER TAKING. Patient not taking: Reported on 05/17/2015 10/25/14   Estill Dooms, MD   atorvastatin (LIPITOR) 40 MG tablet TAKE 1 TABLET ONCE A DAY TO CONTROL CHOLESTEROL. Patient not taking: Reported on 05/17/2015 04/29/15   Estill Dooms, MD  docusate sodium (COLACE) 100 MG capsule Take 1 capsule (100 mg total) by mouth every 12 (twelve) hours. 04/25/15   Jola Schmidt, MD  donepezil (ARICEPT) 10 MG tablet Take one tablet daily 04/30/15   Historical Provider, MD  feeding supplement, GLUCERNA SHAKE, (GLUCERNA SHAKE) LIQD Take 237 mLs by mouth 2 (two) times daily between meals. Patient taking differently: Take 237 mLs by mouth. Drinks 3 -6 daily .  And two glasses of tea 10/10/14   Scott T Kathlen Mody, PA-C  fenofibrate 160 MG tablet TAKE 1 TABLET ONCE A DAY TO CONTROL CHOLESTEROL. Patient not taking: Reported on 05/17/2015 03/10/15   Estill Dooms, MD  fentaNYL (DURAGESIC) 25 MCG/HR patch Apply fresh patch every 3 days to help pain. Remove old patch. 05/17/15   Estill Dooms, MD  furosemide (LASIX) 20 MG tablet Take 1 tablet (20 mg total) by mouth every other day. Patient not taking: Reported on 05/17/2015 04/29/15   Estill Dooms, MD  losartan (COZAAR) 25 MG tablet Take 1 tablet (25 mg total) by mouth daily. 05/11/15   Estill Dooms, MD  megestrol (MEGACE ES) 625 MG/5ML suspension 5 cc daily to stimulate appetite 05/17/15   Estill Dooms, MD  memantine (NAMENDA XR) 28 MG CP24 24 hr capsule Take 1 capsule (28 mg total) by mouth daily. 02/14/15   Dennie Bible, NP  metFORMIN (GLUCOPHAGE) 500 MG tablet TAKE 1 TABLET EACH MORNING TO CONTROL GLUCOSE. Patient not taking: Reported on 05/17/2015 02/18/15   Estill Dooms, MD  nitroGLYCERIN (NITROSTAT) 0.4 MG SL tablet Place 0.4 mg under the tongue every 5 (five) minutes as needed for chest pain.    Historical Provider, MD  Rivaroxaban (XARELTO) 15 MG TABS tablet Take 1 tablet (15 mg total) by mouth daily with supper. 10/27/14   Lelon Perla, MD  solifenacin (VESICARE) 10 MG tablet Take 10 mg by mouth daily.    Historical Provider, MD   tamsulosin (FLOMAX) 0.4 MG CAPS capsule Take 0.4 mg by mouth daily after breakfast. For bladder 07/14/13   Historical Provider, MD   BP 96/57 mmHg  Pulse 101  Temp(Src) 98.7 F (37.1 C) (Rectal)  Resp 22  SpO2 97% Physical Exam  Constitutional: He appears well-developed and well-nourished.  HENT:  Head: Normocephalic and atraumatic.  Cardiovascular: Normal rate and regular rhythm.   No murmur heard. Pulmonary/Chest: Effort normal and breath sounds normal. No respiratory distress.  Abdominal: Soft. There is no tenderness. There is no rebound and no guarding.  Musculoskeletal: He exhibits no edema or tenderness.  Neurological: He is alert.  Moves all extremities  Skin: Skin is warm and dry.  Psychiatric: He has a normal mood and affect. His behavior is normal.  Nursing note and vitals reviewed.   ED Course  Procedures (including critical care time) Labs Review Labs Reviewed  COMPREHENSIVE METABOLIC PANEL - Abnormal; Notable for the following:    Glucose, Bld 149 (*)    BUN 64 (*)    Creatinine, Ser 1.67 (*)    Albumin 3.1 (*)    GFR calc non Af Amer 35 (*)    GFR calc Af Amer 40 (*)    All other components within normal limits  URINALYSIS, ROUTINE W REFLEX MICROSCOPIC (NOT AT [facility]) - Abnormal; Notable for the following:    Color, Urine AMBER (*)    APPearance CLOUDY (*)    All other components within normal limits  GLUCOSE, CAPILLARY - Abnormal; Notable for the following:    Glucose-Capillary 111 (*)    All other components within normal limits  CBC WITH DIFFERENTIAL/PLATELET  TSH  BASIC METABOLIC PANEL  CBC  I-STAT TROPOININ, ED    Imaging Review Dg Chest 2 View  05/20/2015   CLINICAL DATA:  Pt presents to department for evaluation of failure to thrive. Daughter reports frequent falls, decreased appetite, weight loss, chronic back pain and confusion. Pt is alert upon arrival to ED. History of CHF, pacemaker, and MI. Wears 2L O2. Currently living in independent  living. Pt has hx of HTN, diabetes, GERD. Non-Smoker.  EXAM: CHEST  2 VIEW  COMPARISON:  10/08/2014  FINDINGS: Stable changes from previous CABG surgery. Cardiac silhouette is mildly enlarged. Aorta is uncoiled. No mediastinal or hilar masses or evidence of adenopathy.  Left anterior chest wall sequential pacemaker is stable and well positioned.  Mild bilateral interstitial thickening. No lung consolidation. No convincing pulmonary edema. No pleural effusion or pneumothorax.  Bony thorax is demineralized.  IMPRESSION: No acute cardiopulmonary disease.   Electronically Signed   By: Lajean Manes M.D.   On: 05/20/2015 15:22     EKG Interpretation   Date/Time:  Friday May 20 2015 13:34:12 EDT Ventricular Rate:  101 PR Interval:  246 QRS Duration: 106 QT Interval:  344 QTC Calculation: 446 R Axis:   -32 Text Interpretation:  Sinus tachycardia with 1st degree A-V block Left  axis deviation Inferior infarct , age undetermined Anterior infarct , age  undetermined Abnormal ECG Confirmed by Hazle Coca [id number]) on 05/20/2015  3:46:06 PM      MDM   Final diagnoses:  Acute kidney injury  Dehydration  Failure to thrive in adult    Patient here for evaluation of failure to thrive, decreased oral intake. Patient is dehydrated with mild hypotension in the department. BMP demonstrates acute kidney injury. Providing gentle IV fluid hydration. Discussed medicine regarding admission for further management.    Quintella Reichert, MD 05/20/15 [phone]

## 2015-05-20 NOTE — H&P (Signed)
[facility] History and Physical  [name] ERX:[id number] DOB: 1923/02/16 DOA: 05/20/2015  Referring physician:  PCP: Estill Dooms, MD   Chief Complaint: Failure to thrive  HPI: Zachary Harris is a 81 y.o. male with a past medical history of dementia, coronary artery disease, status post coronary artery bypass grafting in 9509, chronic systolic congestive heart failure, history of atrial fibrillation on chronic anticoagulation with Xarelto, hypertension, who was referred to the emergency department by his primary care physician for failure to thrive. Patient having back pain in the month of April for which lumbar x-ray was ordered showing progressive compression fracture at T12 compared to prior study on 10/14/2013 with continued loss of vertebral body height now at approximately 50%. Family members reporting that since then he has had a progressive functional decline/failure to thrive. Family members reporting patient having poor oral intake, having excessive daytime sleepiness, becoming less active, requiring greater assistance for activities of daily living. Patient was prescribed fentanyl 25 mcg patch every 72 hours for pain relief. He was also recently prescribed megestrol. Despite these interventions along with outpatient physical therapy he has continued to decline. History was obtained from his daughter at bedside, patient unable to provide history or participate in his own plan of care.                                                                        Review of Systems:  Can not obtain a reliable review of systems given dementia  Past Medical History  Diagnosis Date  . ANEMIA-IRON DEFICIENCY 05/27/2007  . ANXIETY 11/14/2007  . Intermittent high-grade heart block 09/05/2009    s/p pacemaker  . BENIGN PROSTATIC HYPERTROPHY 11/14/2007  . CAROTID ARTERY DISEASE 10/02/2010  . Pacemaker-St. Jude 02/02/2008    St. Jude  . COLONIC POLYPS, HX OF 05/27/2007  . CORONARY ARTERY  DISEASE 11/14/2007    prior bypass [phone]; Inferior MI 11/07; Severe 3 vessel disease; occluded SVG to RCA; SVG to OM subtotalled, SVG to diagonal with ostial 70 and 80 mid; EF 45. PCI of SVG to OM. FU myovue showed EF 56 with inferior infarct and very mild peri-infarct ischemia  . DEPRESSION 11/14/2007  . DIABETES MELLITUS, TYPE II 11/14/2007  . DIVERTICULITIS, HX OF 05/27/2007  . DVT (deep venous thrombosis) 03/21/2011  . FOOT PAIN, BILATERAL 12/08/2010  . GERD 11/14/2007  . HYPERLIPIDEMIA 11/14/2007  . HYPERTENSION 11/14/2007  . HYPOTENSION, ORTHOSTATIC 03/06/2011  . Memory loss 06/01/2008  . OSTEOPOROSIS 06/02/2010  . PERIPHERAL VASCULAR DISEASE 11/14/2007  . RENAL INSUFFICIENCY 11/14/2007  . SECONDARY DM W/PERIPHERAL CIRC D/O UNCONTROLLED 11/07/2010  . SYNCOPE 09/08/2008  . Unspecified hearing loss 05/13/2009  . Prostate cancer   . Myalgia and myositis, unspecified 04/24/2011  . Other malaise and fatigue 04/24/2011  . Peripheral neuropathy 03/27/2011  . Seborrheic keratosis 06/05/2011  . Osteoarthrosis, unspecified whether generalized or localized, unspecified site   . Ischemic cardiomyopathy     a. echo (10/15):  mod focal basal septal hypertrophy, EF 40-45%, inf AK, Gr 1 DD, Al sclerosis, mild AI, MAC, mild MR, mod LAE, lipomatous hypertrophy, small effusion (no hemodynamic compromise)   . Effusion of left knee 03/17/2015  . Severe back pain   . DM (  diabetes mellitus), type 2 with renal complications 01/01/3234   Past Surgical History  Procedure Laterality Date  . S/p coronary stent x 1    . Cholecystectomy    . S/p bilat knee replacement  Zachary Gustin, MD  . Pacemaker insertion  2012    Zachary Comes, MD  . Prostate needle biopsy    . Cystoscopy    . Appendectomy    . Coronary artery bypass graft  2007   Social History:  reports that he quit smoking about 31 years ago. His smoking use included Cigarettes. He has a 50 pack-year smoking history. He has never used smokeless tobacco. He  reports that he does not drink alcohol or use illicit drugs.  Allergies  Allergen Reactions  . Hydrocodone     hallucination  . Tramadol     hallucination    Family History  Problem Relation Age of Onset  . Diabetes Father     42  . Cancer Mother     Prior to Admission medications   Medication Sig Start Date End Date Taking? Authorizing Provider  alendronate (FOSAMAX) 70 MG tablet TAKE 1 TAB ONCE A WK AT LEAST 30 MIN BEFORE 1ST FOOD.DO NOT LIE DOWN FOR 30 MIN AFTER TAKING. Patient not taking: Reported on 05/17/2015 10/25/14   Estill Dooms, MD  atorvastatin (LIPITOR) 40 MG tablet TAKE 1 TABLET ONCE A DAY TO CONTROL CHOLESTEROL. Patient not taking: Reported on 05/17/2015 04/29/15   Estill Dooms, MD  docusate sodium (COLACE) 100 MG capsule Take 1 capsule (100 mg total) by mouth every 12 (twelve) hours. 04/25/15   Jola Schmidt, MD  donepezil (ARICEPT) 10 MG tablet Take one tablet daily 04/30/15   Historical Provider, MD  feeding supplement, GLUCERNA SHAKE, (GLUCERNA SHAKE) LIQD Take 237 mLs by mouth 2 (two) times daily between meals. Patient taking differently: Take 237 mLs by mouth. Drinks 3 -6 daily .  And two glasses of tea 10/10/14   Scott T Kathlen Mody, PA-C  fenofibrate 160 MG tablet TAKE 1 TABLET ONCE A DAY TO CONTROL CHOLESTEROL. Patient not taking: Reported on 05/17/2015 03/10/15   Estill Dooms, MD  fentaNYL (DURAGESIC) 25 MCG/HR patch Apply fresh patch every 3 days to help pain. Remove old patch. 05/17/15   Estill Dooms, MD  furosemide (LASIX) 20 MG tablet Take 1 tablet (20 mg total) by mouth every other day. Patient not taking: Reported on 05/17/2015 04/29/15   Estill Dooms, MD  losartan (COZAAR) 25 MG tablet Take 1 tablet (25 mg total) by mouth daily. 05/11/15   Estill Dooms, MD  megestrol (MEGACE ES) 625 MG/5ML suspension 5 cc daily to stimulate appetite 05/17/15   Estill Dooms, MD  memantine (NAMENDA XR) 28 MG CP24 24 hr capsule Take 1 capsule (28 mg total) by mouth daily. 02/14/15    Dennie Bible, NP  metFORMIN (GLUCOPHAGE) 500 MG tablet TAKE 1 TABLET EACH MORNING TO CONTROL GLUCOSE. Patient not taking: Reported on 05/17/2015 02/18/15   Estill Dooms, MD  nitroGLYCERIN (NITROSTAT) 0.4 MG SL tablet Place 0.4 mg under the tongue every 5 (five) minutes as needed for chest pain.    Historical Provider, MD  Rivaroxaban (XARELTO) 15 MG TABS tablet Take 1 tablet (15 mg total) by mouth daily with supper. 10/27/14   Lelon Perla, MD  solifenacin (VESICARE) 10 MG tablet Take 10 mg by mouth daily.    Historical Provider, MD  tamsulosin (FLOMAX) 0.4 MG CAPS capsule Take  0.4 mg by mouth daily after breakfast. For bladder 07/14/13   Historical Provider, MD   Physical Exam: Filed Vitals:   05/20/15 1630 05/20/15 1645 05/20/15 1745 05/20/15 1800  BP: 101/51 96/55 113/53 101/51  Pulse: 91 85 81 104  Temp:      TempSrc:      Resp: 17 17 16 19   SpO2: 96% 98% 97% 99%    Wt Readings from Last 3 Encounters:  05/10/15 84.369 kg (186 lb)  05/03/15 75.978 kg (167 lb 8 oz)  04/04/15 80.876 kg (178 lb 4.8 oz)    General:  Appears calm and comfortable Eyes: PERRL, normal lids, irises & conjunctiva ENT: grossly normal hearing, lips & tongue Neck: no LAD, masses or thyromegaly Cardiovascular: RRR, no m/r/g. No LE edema. Telemetry: SR, no arrhythmias  Respiratory: CTA bilaterally, no w/r/r. Normal respiratory effort. Abdomen: soft, ntnd Skin: no rash or induration seen on limited exam Musculoskeletal: grossly normal tone BUE/BLE Psychiatric: grossly normal mood and affect, speech fluent and appropriate Neurologic: grossly non-focal.          Labs on Admission:  Basic Metabolic Panel:  Recent Labs Lab 05/20/15 1341  NA 137  K 3.8  CL 104  CO2 23  GLUCOSE 149*  BUN 64*  CREATININE 1.67*  CALCIUM 9.5   Liver Function Tests:  Recent Labs Lab 05/20/15 1341  AST 35  ALT 19  ALKPHOS 122  BILITOT 0.9  PROT 6.8  ALBUMIN 3.1*   No results for input(s):  LIPASE, AMYLASE in the last 168 hours. No results for input(s): AMMONIA in the last 168 hours. CBC:  Recent Labs Lab 05/20/15 1341  WBC 8.3  NEUTROABS 5.9  HGB 13.1  HCT 39.0  MCV 90.9  PLT 182   Cardiac Enzymes: No results for input(s): CKTOTAL, CKMB, CKMBINDEX, TROPONINI in the last 168 hours.  BNP (last 3 results)  Recent Labs  03/15/15 1039  BNP 213.8*    ProBNP (last 3 results)  Recent Labs  10/08/14 1045  PROBNP 678.3*    CBG: No results for input(s): GLUCAP in the last 168 hours.  Radiological Exams on Admission: Dg Chest 2 View  05/20/2015   CLINICAL DATA:  Pt presents to department for evaluation of failure to thrive. Daughter reports frequent falls, decreased appetite, weight loss, chronic back pain and confusion. Pt is alert upon arrival to ED. History of CHF, pacemaker, and MI. Wears 2L O2. Currently living in independent living. Pt has hx of HTN, diabetes, GERD. Non-Smoker.  EXAM: CHEST  2 VIEW  COMPARISON:  10/08/2014  FINDINGS: Stable changes from previous CABG surgery. Cardiac silhouette is mildly enlarged. Aorta is uncoiled. No mediastinal or hilar masses or evidence of adenopathy.  Left anterior chest wall sequential pacemaker is stable and well positioned.  Mild bilateral interstitial thickening. No lung consolidation. No convincing pulmonary edema. No pleural effusion or pneumothorax.  Bony thorax is demineralized.  IMPRESSION: No acute cardiopulmonary disease.   Electronically Signed   By: Lajean Manes M.D.   On: 05/20/2015 15:22    EKG: Independently reviewed.   Assessment/Plan Principal Problem:   FTT (failure to thrive) in adult Active Problems:   Vertebral compression fracture   Pacemaker-St.Jude   Ischemic cardiomyopathy   Paroxysmal atrial fibrillation   Compression fracture   Generalized weakness   AKI (acute kidney injury)   Dehydration   1. Failure to thrive/functional decline. Patient with history of multiple comorbidities  presenting with progressive functional decline over the past month since  being diagnosed with compression fracture involving T12. I suspect fascial thrive is multifactorial with pain, decreased mobility from compression fracture, physical deconditioning, dehydration from diuretics and decreased by mouth intake all likely contributors. Will discontinue antihypertensives agents along with Lasix, provide gentle IV fluid hydration, consult physical therapy, and proceed with workup for possible underlying infectious process. Patient may benefit from rehabilitation at skilled nursing facility.  2. T12 compression fracture. Plain films of lumbar spine performed on 04/25/2015 showing progression of a compression fracture involving T12 compared to prior study from 10/14/2013. He is on bisphosphonate therapy for osteoporosis. Patient would benefit from physical therapy evaluation during this hospitalization, as he may need rehabilitation at skilled nursing facility. Will regard to pain management his PCP and family tell me that he has developed severe agitation/hallucinations with hydrocodone and tramadol. He seems to be tolerating fentanyl 25 g patch every 72 hours,and will schedule Tylenol thousand milligrams by mouth BID.  3. History of paroxysmal atrial fibrillation. Patient presented in sinus rhythm, will continue anticoagulation with Xarelto 4. Acute kidney injury. Labs performed in the emergency room showing a creatinine 1.67, increased from 1.39 on 05/12/2015. Likely secondary to prerenal azotemia/hypovolemia in setting of functional decline and minimal by mouth intake. He has a history of CHF and had also been on erratic therapy. Discontinue Lasix and ARB and provide gentle IV fluid hydration with normal saline overnight.  5. Type 2 diabetes mellitus. Given decreased by mouth intake and renal failure will discontinue metformin. Provide Accu-Cheks every before meals and daily at bedtime with sliding scale  coverage 6. History of hypertension. Patient presenting with hypotension likely resulting from profound dehydration and hypovolemia, having a blood pressure 96 or 55 in the emergency department. Will discontinue Cozaar.  7. Chronic Systolic CHF. Last echo was done on 10/09/2014 showing EF of 40-45%. He is clinically dry having elevated creatinine. Will provide gentle IV fluid hydration over night, monitor volume status closely.  8. DVT prophylaxis. Patient fully anticoagulated Xarelto   Code Status: DNR Family Communication: I spoke with his daughter who reported he is a DNR Disposition Plan: Will admit to the inpatient service, anticipate he will require greater than 2 nights hospitalization  Time spent: 70 min  Kelvin Cellar [facility] Pager [phone]

## 2015-05-20 NOTE — ED Notes (Signed)
The patient was sent from DR ibazebo office for FTT. The patients PCP is Dr Jeanmarie Hubert and can be contacted for questions at [phone]

## 2015-05-20 NOTE — ED Notes (Signed)
Pt presents to department for evaluation of failure to thrive. Daughter reports frequent falls, decreased appetite, weight loss, chronic back pain and confusion. Pt is alert upon arrival to ED. History of CHF, pacemaker, and MI. Wears 2L O2. Currently living in independent living.

## 2015-05-21 DIAGNOSIS — T148 Other injury of unspecified body region: Secondary | ICD-10-CM

## 2015-05-21 DIAGNOSIS — M4850XG Collapsed vertebra, not elsewhere classified, site unspecified, subsequent encounter for fracture with delayed healing: Secondary | ICD-10-CM

## 2015-05-21 LAB — CBC
HEMATOCRIT: 34.6 % — AB (ref 39.0–52.0)
HEMOGLOBIN: 11.5 g/dL — AB (ref 13.0–17.0)
MCH: 29.9 pg (ref 26.0–34.0)
MCHC: 33.2 g/dL (ref 30.0–36.0)
MCV: 90.1 fL (ref 78.0–100.0)
Platelets: 147 10*3/uL — ABNORMAL LOW (ref 150–400)
RBC: 3.84 MIL/uL — AB (ref 4.22–5.81)
RDW: 14.2 % (ref 11.5–15.5)
WBC: 6.3 10*3/uL (ref 4.0–10.5)

## 2015-05-21 LAB — GLUCOSE, CAPILLARY
GLUCOSE-CAPILLARY: 102 mg/dL — AB (ref 65–99)
Glucose-Capillary: 146 mg/dL — ABNORMAL HIGH (ref 65–99)
Glucose-Capillary: 123 mg/dL — ABNORMAL HIGH (ref 65–99)
Glucose-Capillary: 112 mg/dL — ABNORMAL HIGH (ref 65–99)

## 2015-05-21 LAB — BASIC METABOLIC PANEL
Anion gap: 7 (ref 5–15)
BUN: 57 mg/dL — ABNORMAL HIGH (ref 6–20)
CALCIUM: 8.8 mg/dL — AB (ref 8.9–10.3)
CHLORIDE: 104 mmol/L (ref 101–111)
CO2: 25 mmol/L (ref 22–32)
CREATININE: 1.47 mg/dL — AB (ref 0.61–1.24)
GFR calc non Af Amer: 40 mL/min — ABNORMAL LOW (ref >60)
GFR, EST AFRICAN AMERICAN: 47 mL/min — AB (ref >60)
Glucose, Bld: 137 mg/dL — ABNORMAL HIGH (ref 65–99)
POTASSIUM: 3.4 mmol/L — AB (ref 3.5–5.1)
SODIUM: 136 mmol/L (ref 135–145)

## 2015-05-21 MED ORDER — POTASSIUM CHLORIDE CRYS ER 20 MEQ PO TBCR
40.0000 meq | EXTENDED_RELEASE_TABLET | Freq: Once | ORAL | Status: AC
  Administered 2015-05-21: 40 meq via ORAL
  Filled 2015-05-21: qty 2

## 2015-05-21 MED ORDER — MEGESTROL ACETATE 400 MG/10ML PO SUSP
400.0000 mg | Freq: Every day | ORAL | Status: DC
  Administered 2015-05-21 – 2015-05-23 (×3): 400 mg via ORAL
  Filled 2015-05-21 (×3): qty 10

## 2015-05-21 MED ORDER — GLUCERNA SHAKE PO LIQD
237.0000 mL | Freq: Two times a day (BID) | ORAL | Status: DC
  Administered 2015-05-21 – 2015-05-23 (×4): 237 mL via ORAL

## 2015-05-21 NOTE — Care Management Note (Signed)
Case Management Note  Patient Details  Name: Zachary Harris MRN: [id number] Date of Birth: 06-30-1923  Subjective/Objective:   81 yo M admitted with failure to thrive. He has a hx of dementia, CAD, s/p coronary artery bypass grafting, chronic systolic CHF, A-fib, chronic anticoagulation, and HTN.                 Action/Plan: Received referral to discuss D/C needs.   Expected Discharge Date:                  Expected Discharge Plan:  Skilled Nursing Facility  In-House Referral:  Clinical Social Work  Discharge planning Services  CM Consult  Post Acute Care Choice:    Choice offered to:     DME Arranged:    DME Agency:     HH Arranged:    [facility] Agency:     Status of Service:  In process, will continue to follow  Medicare Important Message Given:    Date Medicare IM Given:    Medicare IM give by:    Date Additional Medicare IM Given:    Additional Medicare Important Message give by:     If discussed at [place] of Stay Meetings, dates discussed:    Additional Comments: met with pt and daughter. Pt resides at [facility]. Daughter stated that he can't go back to independent living. He requires a different level of care. [facility] has a skilled nursing unit for short term rehab. She stated that he went to the skilled nursing unit last March after he fell and he did not do well. He did not eat and refused hygiene care. She is concern about where he needs to go. She is hoping he can return to independent living, but she lives in [place] and she flies every week to [place] to take care of her father and this is getting harder for her. She has a brother who stays every night with their father when she is not here. Pt also has a caregiver that is private pay. She stated that she can't continue to come here every week and this is also hard for her brother. She became tearful and stated that she needs to talk to a geriatric counselor or SW. Provided emotional support. Explained  SW referral. She wants to talk to the SW before she returns to [facility] tomorrow. Contacted Zachary Harris, SW, and explained daughter's concern.  Discussed with nurse SW referral.  Zachary Buzzard, RN 05/21/2015, 10:29 AM

## 2015-05-21 NOTE — Evaluation (Signed)
Physical Therapy Evaluation Patient Details Name: Zachary Harris MRN: [id number] DOB: Aug 11, 1923 Today's Date: 05/21/2015   History of Present Illness  Patient is an 81 yo male admitted 05/20/15 with FTT.  PMH:  dementia, CAD, CABG, CHF, Afib, HTN, compression fx T12, anxiety, DM, pacemaker  Clinical Impression  Patient presents with problems listed below.  Will benefit from acute PT to maximize mobility prior to discharge.  Patient requires +2 mod-max assist for mobility.  Recommend SNF at discharge.  Following session, daughter expressed concern with current home situation.  She is coming from [place] each week to care for patient for half of the week.  The other half is covered by patient's son and a caregiver.  Patient tearful.  Contacted CSW who is coming to talk with daughter about d/c options.      Follow Up Recommendations SNF;Supervision/Assistance - 24 hour    Equipment Recommendations  Wheelchair (measurements PT);Wheelchair cushion (measurements PT)    Recommendations for Other Services       Precautions / Restrictions Precautions Precautions: Fall Precaution Comments: Dementia, impulsive Restrictions Weight Bearing Restrictions: No      Mobility  Bed Mobility Overal bed mobility: Needs Assistance Bed Mobility: Rolling;Sidelying to Sit;Sit to Sidelying Rolling: Min guard Sidelying to sit: Min assist     Sit to sidelying: Min assist General bed mobility comments: Verbal cues for technique.  Patient using bedrail for mobility.  Once sitting, patient tolerated x 20 seconds and "had to lay back down".  Following rest, patient returned to sitting with min assist.  Patient impulsive during mobility.  Transfers Overall transfer level: Needs assistance Equipment used: 2 person hand held assist Transfers: Sit to/from [employer] Sit to Stand: Mod assist;+2 physical assistance   Squat pivot transfers: Mod assist;+2 physical assistance     General  transfer comment: Patient required +2 assist to attempt to stand.  Patient unable to achieve fully upright position.  Patient then performed squat-pivot transfer to chair with +2 assist.  Patient with very kyphotic posture.  Ambulation/Gait                Stairs            Wheelchair Mobility    Modified Rankin (Stroke Patients Only)       Balance Overall balance assessment: Needs assistance Sitting-balance support: Single extremity supported;Feet supported Sitting balance-Leahy Scale: Fair Sitting balance - Comments: Kyphotic posture   Standing balance support: Bilateral upper extremity supported Standing balance-Leahy Scale: Poor                               Pertinent Vitals/Pain Pain Assessment: Faces Faces Pain Scale: Hurts whole lot Pain Location: Back Pain Descriptors / Indicators: Grimacing;Guarding;Moaning Pain Intervention(s): Monitored during session;Repositioned    Home Living Family/patient expects to be discharged to:: [place]: Walker - 2 wheels;Transport chair      Prior Function Level of Independence: Needs assistance   Gait / Transfers Assistance Needed: Primarily bed bound due to pain.  Required +2 assist to ambulate with RW to bathroom.  ADL's / Homemaking Assistance Needed: Total assist with all ADL's        Hand Dominance        Extremity/Trunk Assessment   Upper Extremity Assessment: Generalized weakness           Lower Extremity  Assessment: Generalized weakness      Cervical / Trunk Assessment: Kyphotic (with T12 fracture - painful with movement)  Communication   Communication: HOH  Cognition Arousal/Alertness: Awake/alert Behavior During Therapy: Impulsive Overall Cognitive Status: History of cognitive impairments - at baseline (Per daughter, has been getting progressively worse)                      General Comments      Exercises         Assessment/Plan    PT Assessment Patient needs continued PT services  PT Diagnosis Difficulty walking;Generalized weakness;Acute pain;Altered mental status   PT Problem List Decreased strength;Decreased activity tolerance;Decreased balance;Decreased mobility;Decreased cognition;Decreased knowledge of use of DME;Decreased safety awareness;Pain  PT Treatment Interventions DME instruction;Gait training;Functional mobility training;Therapeutic activities;Therapeutic exercise;Cognitive remediation;Patient/family education   PT Goals (Current goals can be found in the Care Plan section) Acute Rehab PT Goals Patient Stated Goal: None stated;  Daughter - "the best and most appropriate discharge plan for my dad" PT Goal Formulation: With patient/family Time For Goal Achievement: 06/04/15 Potential to Achieve Goals: Fair    Frequency Min 2X/week   Barriers to discharge        Co-evaluation               End of Session Equipment Utilized During Treatment: Gait belt;Oxygen Activity Tolerance: Patient limited by pain Patient left: in chair;with call bell/phone within reach;with family/visitor present Nurse Communication: Mobility status         Time: 0962-8366 PT Time Calculation (min) (ACUTE ONLY): 24 min   Charges:   PT Evaluation $Initial PT Evaluation Tier I: 1 Procedure PT Treatments $Therapeutic Activity: 8-22 mins   PT G Codes:        Despina Pole 2015/06/17, 4:42 PM Carita Pian. Sanjuana Kava, [place] Pager [phone]

## 2015-05-21 NOTE — Progress Notes (Signed)
[facility] PROGRESS NOTE  Zachary Harris UUV:[id number] DOB: [birth year] DOA: 05/20/2015 PCP: Estill Dooms, MD  Assessment/Plan: 1. Failure to thrive/functional decline -Patient presenting with progressive decline over the past month since being diagnosed with compression fracture involving T12. -Suspect failure to thrive is multifactorial with pain, decreased mobility, physical deconditioning, dehydration, hypotension all likely contributors. -This morning he was assisted out of bed to bedside chair, and was tolerating his breakfast. Sharol Given who was present at bedside reporting patient had not eaten in several days and this was an improvement. -Physical therapy consulted  2.  T12 compression fracture -X-ray performed 04/25/2015 showed progression of a compression fracture involving T12. -Case discussed with his PCP who informed me that he developed hallucinations with hydrocodone and tramadol. He seems to be tolerating that well 25 g patch every 72 hours. Have added Tylenol as milligrams by mouth twice a day scheduled dosing. -He seems to be doing a little better this morning, physical therapy consulted. May benefit from rehabilitation at skilled nursing facility  3.  Acute kidney injury -Likely secondary to prerenal azotemia/hypotension/dehydration. -Present with stranding 1.67, improving to 1.47 with gentle IV fluid hydration overnight -Will continue holding Lasix and Cozaar  4.  Chronic systolic congestive heart failure. -Last echo performed on 10/09/2014 showing EF of 40-45%. Given presentation of acute kidney injury, hypotension, dehydration, direct therapy was discontinued, as he was hydrated with normal saline. -He remains compensated. Will continue monitoring volume status  5.  Type 2 diabetes mellitus -Metformin was held -Continue Accu-Cheks with sliding scale coverage  6.  Paroxysmal atrial fibrillation -Remains rate controlled -Continue anticoagulation with  Xarelto  Code Status: DO NOT RESUSCITATE Family Communication: Spoke to his daughter this morning updated her on his condition Disposition Plan: Physical therapy consultation, a need skilled nursing facility placement for rehabilitation   Consultants:  Physical therapy   HPI/Subjective: Patient is an 81 year old woman with a past medical history of advanced dementia, coronary disease, chronic systolic congestive heart failure, history of atrial fibrillation, admitted to the medicine service on 05/20/2015 presenting with failure to thrive/functional decline. Family members had reported a progressive functional decline over the past month. He was found to have progressive compression fracture at T12 on x-ray performed on 04/25/2015. On presentation he appeared dry and was hypotensive resulting from profound dehydration. Labs showed acute on chronic renal failure. He was gently hydrated with IV fluid resuscitation as his creatinine improved from 0.67-1.47 on the following day. Physical therapy was consulted.  Objective: Filed Vitals:   05/21/15 0611  BP: 112/61  Pulse: 80  Temp: 98.2 F (36.8 C)  Resp: 18   No intake or output data in the 24 hours ending 05/21/15 0916 Filed Weights   05/20/15 1930  Weight: 75.796 kg (167 lb 1.6 oz)    Exam:   General:  Patient seems better, he was assisted out of bed to chair, sitting up having his breakfast now  Cardiovascular: Regular rate and rhythm normal S1-S2 no murmurs rubs or gallops  Respiratory: Lungs are clear to auscultation normal respiratory effort had a few bibasilar crackles  Abdomen: Soft nontender nondistended positive bowel sounds  Musculoskeletal: No edema  Data Reviewed: Basic Metabolic Panel:  Recent Labs Lab 05/20/15 1341 05/21/15 0523  NA 137 136  K 3.8 3.4*  CL 104 104  CO2 23 25  GLUCOSE 149* 137*  BUN 64* 57*  CREATININE 1.67* 1.47*  CALCIUM 9.5 8.8*   Liver Function Tests:  Recent Labs Lab  05/20/15 1341  AST 35  ALT 19  ALKPHOS 122  BILITOT 0.9  PROT 6.8  ALBUMIN 3.1*   No results for input(s): LIPASE, AMYLASE in the last 168 hours. No results for input(s): AMMONIA in the last 168 hours. CBC:  Recent Labs Lab 05/20/15 1341 05/21/15 0523  WBC 8.3 6.3  NEUTROABS 5.9  --   HGB 13.1 11.5*  HCT 39.0 34.6*  MCV 90.9 90.1  PLT 182 147*   Cardiac Enzymes: No results for input(s): CKTOTAL, CKMB, CKMBINDEX, TROPONINI in the last 168 hours. BNP (last 3 results)  Recent Labs  03/15/15 1039  BNP 213.8*    ProBNP (last 3 results)  Recent Labs  10/08/14 1045  PROBNP 678.3*    CBG:  Recent Labs Lab 05/20/15 2139 05/21/15 0641  GLUCAP 111* 102*    No results found for this or any previous visit (from the past 240 hour(s)).   Studies: Dg Chest 2 View  05/20/2015   CLINICAL DATA:  Pt presents to department for evaluation of failure to thrive. Daughter reports frequent falls, decreased appetite, weight loss, chronic back pain and confusion. Pt is alert upon arrival to ED. History of CHF, pacemaker, and MI. Wears 2L O2. Currently living in independent living. Pt has hx of HTN, diabetes, GERD. Non-Smoker.  EXAM: CHEST  2 VIEW  COMPARISON:  10/08/2014  FINDINGS: Stable changes from previous CABG surgery. Cardiac silhouette is mildly enlarged. Aorta is uncoiled. No mediastinal or hilar masses or evidence of adenopathy.  Left anterior chest wall sequential pacemaker is stable and well positioned.  Mild bilateral interstitial thickening. No lung consolidation. No convincing pulmonary edema. No pleural effusion or pneumothorax.  Bony thorax is demineralized.  IMPRESSION: No acute cardiopulmonary disease.   Electronically Signed   By: Lajean Manes M.D.   On: 05/20/2015 15:22    Scheduled Meds: . acetaminophen  1,000 mg Oral BID  . fentaNYL  25 mcg Transdermal Q72H  . insulin aspart  0-15 Units Subcutaneous TID WC  . insulin aspart  0-5 Units Subcutaneous QHS  .  memantine  28 mg Oral Daily  . potassium chloride  40 mEq Oral Once  . Rivaroxaban  15 mg Oral Q supper  . tamsulosin  0.4 mg Oral QPC breakfast   Continuous Infusions: . sodium chloride 75 mL/hr (05/20/15 2029)    Principal Problem:   FTT (failure to thrive) in adult Active Problems:   Vertebral compression fracture   Pacemaker-St.Jude   Ischemic cardiomyopathy   Paroxysmal atrial fibrillation   Compression fracture   Generalized weakness   AKI (acute kidney injury)   Dehydration    Time spent: 35 minutes    Kelvin Cellar  [facility] Pager [phone]. If 7PM-7AM, please contact night-coverage at [email or web address], password [facility] 05/21/2015, 9:16 AM  LOS: 1 day

## 2015-05-21 NOTE — Progress Notes (Signed)
Spoke at length with pt's daughter re: d/c planning for pt.  CSW recommending Palliative Care c/s for GOC.  Daughter in agreement.  Full assessment to follow.

## 2015-05-21 NOTE — Progress Notes (Signed)
Initial Nutrition Assessment  DOCUMENTATION CODES: Severe malnutrition in context of chronic illness  INTERVENTION: Magic cup with lunch, each supplement provides 290 kcal and 9 grams of protein  Glucerna Shake po BID, each supplement provides 220 kcal and 10 grams of protein  Recommend MVI with minerals  Recommend continuing appetite stimulant  NUTRITION DIAGNOSIS: Inadequate oral intake related to complete loss of appetite iin the setting of Dementia as evidenced by loss of 10% bw in 3 months  GOAL: Patient will meet greater than or equal to 90% of their needs  MONITOR: PO intake, Labs, I & O's, Supplement acceptance, Skin, Goals of care  REASON FOR ASSESSMENT: Malnutrition Screening Tool  ASSESSMENT: 81 y.o. male PMHx: dementia, CAD, CHF, A-FIB, HTN who was referred to the emergency department by his primary care physician for failure to thrive. Family members report poor PO intake, fatigue and requires greater assistance. Was prescribed megace to no avail.   Spoke with daughter at bedside. She reports that pt had a compression fx in April. Since that time, pt has declined rapidly. He has almost no appetite and will only eat a couple bites of his meals. Family member states they offer him whatever he wants and try many different snacks, but he just isnt hungry. He was recently started on Megace, but has only had a couple doses before he was admitted. They would try to supplement his diet with Boost Glucose control. Pt's intake of these ranged widely, from 1-4 per day.  Pts normal cbgs ~140. Pt did not take any vitamin/minerals stated his cardiologist d/c them all years ago for an unknown reason.   Daughter also reports that pt appears to be having to work harder to swallow his meals. Pt is incontinent of stool and bladder and daughter reports very abnormal looking bowel movements recently. Daughter reports patient has sores and redness all over body. She said pt will bleed through  his skin.  His normal weight is 187-190, which is what he weighed before his compression fx. She notices he look much skinnier and said when he is on his side his hip bones protrude significantly.   We briefly discussed alternate means of feeding, but she says the pt wouldn't want that.   Pt loves chocolate. Will order chocolate Ensure/Boost and will try magic cup.  Height: Ht Readings from Last 1 Encounters:  05/20/15 5\' 9"  (1.753 m)    Weight: Wt Readings from Last 1 Encounters:  05/20/15 167 lb 1.6 oz (75.796 kg)    Ideal Body Weight:  72.7 kg   Wt Readings from Last 10 Encounters:  05/20/15 167 lb 1.6 oz (75.796 kg)  05/10/15 186 lb (84.369 kg)  05/03/15 167 lb 8 oz (75.978 kg)  04/04/15 178 lb 4.8 oz (80.876 kg)  03/17/15 178 lb 4.8 oz (80.876 kg)  02/15/15 187 lb (84.823 kg)  02/14/15 186 lb 3.2 oz (84.46 kg)  02/01/15 185 lb 11.2 oz (84.233 kg)  01/13/15 187 lb 9.6 oz (85.095 kg)  11/17/14 179 lb 12.8 oz (81.557 kg)  Loss of 20 lbs in 3 months  BMI:  Body mass index is 24.67 kg/(m^2).  Estimated Nutritional Needs: Kcal:  1500-1700 (20-23 kcal/kg) Protein:  76-91 (1-1.2 g/kg) Fluid:  1.5-1.7 liters  Skin:  Dry with Rash  Diet Order:  Diet regular Room service appropriate?: Yes; Fluid consistency:: Thin  EDUCATION NEEDS: No education needs identified at this time  No intake or output data in the 24 hours ending 05/21/15 1443  Last BM: 5/26  Burtis Junes RD, LDN Nutrition Pager: [phone] 05/21/2015 2:43 PM

## 2015-05-22 DIAGNOSIS — Z515 Encounter for palliative care: Secondary | ICD-10-CM

## 2015-05-22 LAB — GLUCOSE, CAPILLARY
GLUCOSE-CAPILLARY: 119 mg/dL — AB (ref 65–99)
GLUCOSE-CAPILLARY: 126 mg/dL — AB (ref 65–99)
GLUCOSE-CAPILLARY: 124 mg/dL — AB (ref 65–99)
GLUCOSE-CAPILLARY: 165 mg/dL — AB (ref 65–99)
Glucose-Capillary: 120 mg/dL — ABNORMAL HIGH (ref 65–99)

## 2015-05-22 LAB — CBC
HEMATOCRIT: 35.3 % — AB (ref 39.0–52.0)
Hemoglobin: 11.9 g/dL — ABNORMAL LOW (ref 13.0–17.0)
MCH: 30.4 pg (ref 26.0–34.0)
MCHC: 33.7 g/dL (ref 30.0–36.0)
MCV: 90.1 fL (ref 78.0–100.0)
Platelets: 126 10*3/uL — ABNORMAL LOW (ref 150–400)
RBC: 3.92 MIL/uL — ABNORMAL LOW (ref 4.22–5.81)
RDW: 14.2 % (ref 11.5–15.5)
WBC: 5.4 10*3/uL (ref 4.0–10.5)

## 2015-05-22 LAB — BASIC METABOLIC PANEL
ANION GAP: 7 (ref 5–15)
BUN: 33 mg/dL — ABNORMAL HIGH (ref 6–20)
CHLORIDE: 107 mmol/L (ref 101–111)
CO2: 21 mmol/L — ABNORMAL LOW (ref 22–32)
CREATININE: 0.92 mg/dL (ref 0.61–1.24)
Calcium: 8.6 mg/dL — ABNORMAL LOW (ref 8.9–10.3)
GFR calc Af Amer: >60 mL/min (ref >60)
Glucose, Bld: 126 mg/dL — ABNORMAL HIGH (ref 65–99)
POTASSIUM: 4.4 mmol/L (ref 3.5–5.1)
SODIUM: 135 mmol/L (ref 135–145)

## 2015-05-22 NOTE — Progress Notes (Signed)
[facility] PROGRESS NOTE  Zachary Harris GMW:[id number] DOB: February 06, 1923 DOA: 05/20/2015 PCP: Zachary Dooms, MD  Assessment/Plan: 1. Failure to thrive/functional decline -Patient presenting with progressive decline over the past month since being diagnosed with compression fracture involving T12. -Suspect failure to thrive is multifactorial with pain, decreased mobility, physical deconditioning, dehydration, hypotension all likely contributors. -Labs improved. He was assisted out of bed to chair, overall I think he's looking better -PT recommending SNF placement  2.  T12 compression fracture -X-ray performed 04/25/2015 showed progression of a compression fracture involving T12. -Case discussed with his PCP who informed me that he developed hallucinations with hydrocodone and tramadol. He seems to be tolerating that well 25 g patch every 72 hours. Have added Tylenol as milligrams by mouth twice a day scheduled dosing. -Will benefit from [facility]. Pain seems a little more controlled  3.  Acute kidney injury -Likely secondary to prerenal azotemia/hypotension/dehydration. -Cr trending down to 0.92 on am labs. Will stop IV fluids -Continue holding Lasix and Cozaar, monitofr blood pressures  4.  Chronic systolic congestive heart failure. -Last echo performed on 10/09/2014 showing EF of 40-45%. Given presentation of acute kidney injury, hypotension, dehydration, direct therapy was discontinued, as he was hydrated with normal saline. -He remains compensated.  -Stopping IV fluids todasy  5.  Type 2 diabetes mellitus -Metformin was held -Continue Accu-Cheks with sliding scale coverage  6.  Paroxysmal atrial fibrillation -Remains rate controlled -Continue anticoagulation with Xarelto  7. Goals of Care -Palliative Care consulted.   Code Status: DO NOT RESUSCITATE Family Communication: Spoke to his daughter this morning updated her on his condition Disposition Plan: Physical  therapy consultation, a need skilled nursing facility placement for rehabilitation   Consultants:  Physical therapy   HPI/Subjective: Patient is an 81 year old woman with a past medical history of advanced dementia, coronary disease, chronic systolic congestive heart failure, history of atrial fibrillation, admitted to the medicine service on 05/20/2015 presenting with failure to thrive/functional decline. Family members had reported a progressive functional decline over the past month. He was found to have progressive compression fracture at T12 on x-ray performed on 04/25/2015. On presentation he appeared dry and was hypotensive resulting from profound dehydration. Labs showed acute on chronic renal failure. He was gently hydrated with IV fluid resuscitation as his creatinine improved from 0.67-1.47 on the following day. Physical therapy was consulted.  Objective: Filed Vitals:   05/22/15 0702  BP: 144/65  Pulse: 76  Temp: 98 F (36.7 C)  Resp: 16    Intake/Output Summary (Last 24 hours) at 05/22/15 0740 Last data filed at 05/22/15 0703  Gross per 24 hour  Intake      0 ml  Output    150 ml  Net   -150 ml   Filed Weights   05/20/15 1930  Weight: 75.796 kg (167 lb 1.6 oz)    Exam:   General:  Patient seems better, he was assisted out of bed to chair, sitting up having his breakfast now  Cardiovascular: Regular rate and rhythm normal S1-S2 no murmurs rubs or gallops  Respiratory: Lungs are clear to auscultation normal respiratory effort had a few bibasilar crackles  Abdomen: Soft nontender nondistended positive bowel sounds  Musculoskeletal: No edema  Data Reviewed: Basic Metabolic Panel:  Recent Labs Lab 05/20/15 1341 05/21/15 0523 05/22/15 0533  NA 137 136 135  K 3.8 3.4* 4.4  CL 104 104 107  CO2 23 25 21*  GLUCOSE 149* 137* 126*  BUN  64* 57* 33*  CREATININE 1.67* 1.47* 0.92  CALCIUM 9.5 8.8* 8.6*   Liver Function Tests:  Recent Labs Lab  05/20/15 1341  AST 35  ALT 19  ALKPHOS 122  BILITOT 0.9  PROT 6.8  ALBUMIN 3.1*   No results for input(s): LIPASE, AMYLASE in the last 168 hours. No results for input(s): AMMONIA in the last 168 hours. CBC:  Recent Labs Lab 05/20/15 1341 05/21/15 0523 05/22/15 0533  WBC 8.3 6.3 5.4  NEUTROABS 5.9  --   --   HGB 13.1 11.5* 11.9*  HCT 39.0 34.6* 35.3*  MCV 90.9 90.1 90.1  PLT 182 147* 126*   Cardiac Enzymes: No results for input(s): CKTOTAL, CKMB, CKMBINDEX, TROPONINI in the last 168 hours. BNP (last 3 results)  Recent Labs  03/15/15 1039  BNP 213.8*    ProBNP (last 3 results)  Recent Labs  10/08/14 1045  PROBNP 678.3*    CBG:  Recent Labs Lab 05/21/15 0641 05/21/15 1118 05/21/15 1718 05/21/15 2147 05/22/15 0643  GLUCAP 102* 146* 123* 112* 119*    No results found for this or any previous visit (from the past 240 hour(s)).   Studies: Dg Chest 2 View  05/20/2015   CLINICAL DATA:  Pt presents to department for evaluation of failure to thrive. Daughter reports frequent falls, decreased appetite, weight loss, chronic back pain and confusion. Pt is alert upon arrival to ED. History of CHF, pacemaker, and MI. Wears 2L O2. Currently living in independent living. Pt has hx of HTN, diabetes, GERD. Non-Smoker.  EXAM: CHEST  2 VIEW  COMPARISON:  10/08/2014  FINDINGS: Stable changes from previous CABG surgery. Cardiac silhouette is mildly enlarged. Aorta is uncoiled. No mediastinal or hilar masses or evidence of adenopathy.  Left anterior chest wall sequential pacemaker is stable and well positioned.  Mild bilateral interstitial thickening. No lung consolidation. No convincing pulmonary edema. No pleural effusion or pneumothorax.  Bony thorax is demineralized.  IMPRESSION: No acute cardiopulmonary disease.   Electronically Signed   By: Lajean Manes M.D.   On: 05/20/2015 15:22    Scheduled Meds: . acetaminophen  1,000 mg Oral BID  . feeding supplement (GLUCERNA  SHAKE)  237 mL Oral BID BM  . fentaNYL  25 mcg Transdermal Q72H  . insulin aspart  0-15 Units Subcutaneous TID WC  . insulin aspart  0-5 Units Subcutaneous QHS  . megestrol  400 mg Oral Daily  . memantine  28 mg Oral Daily  . Rivaroxaban  15 mg Oral Q supper  . tamsulosin  0.4 mg Oral QPC breakfast   Continuous Infusions:    Principal Problem:   FTT (failure to thrive) in adult Active Problems:   Vertebral compression fracture   Pacemaker-St.Jude   Ischemic cardiomyopathy   Paroxysmal atrial fibrillation   Compression fracture   Generalized weakness   AKI (acute kidney injury)   Dehydration    Time spent: 25 minutes    Kelvin Cellar  [facility] Pager [phone]. If 7PM-7AM, please contact night-coverage at [email or web address], password [facility] 05/22/2015, 7:40 AM  LOS: 2 days

## 2015-05-22 NOTE — Consult Note (Signed)
Consultation Note Date: 05/22/2015   Patient Name: Zachary Harris  DOB: 17-Mar-1923  MRN: [id number]  Age / Sex: 81 y.o., male   PCP: Estill Dooms, MD Referring Physician: Kelvin Cellar, MD  Reason for Consultation: Establishing goals of care  Palliative Care Assessment and Plan Summary of Established Goals of Care and Medical Treatment Preferences    Palliative Care Discussion Held Today Contacts/Participants in Discussion: Primary Decision Maker: Daughter, Anda Kraft, [phone]  HCPOA: yes   Met with daughter  And had a lengthy conversation regarding disease progression r/t advanced dementia and her father's journey. Daughter describes decline noticeably starting in October of llast year. Pt at that time went into CHF, was hospitalized then went to [facility] for rehab. While in rehab was completely uncooperative with eating, PT, bathing, but eventually did gain enough ground to return to his independent living  apartment where he has lived since 2013. He initially had a caregiver coming into the home to help him during the day, but would still go out to eat with caregiver, and attempt to do some things for himself. He did not however return to his baseline before hospitalization and rehab. Daughter reports that family has struggled with trying to keep pt as mobile as possible as well as eating but these issues have become progressively harder. When they saw the next sharp decline was in March when he fell . He again went to skilled level of care briefly to return to apartment with caregiver and son and daughter rotating other care. . In mid April began to c/o severe back pain and refused to get out of bed or eat. Imagining was done and he had sustained a Compression FX T12. Pain mgt with hydrocodone as well as ultram was tried but pt hallucinated. He is ow on Fentanyl 70mg and is not verbalizing as much pain but his functional level has not improved. A typical day he will sleep until  4pm, then go back to bed at 7pm again, with family prompting him to get up most days. A usual day  he at best drinks a chocolate boost, but will often take nothing by mouth. He has lost 20 lbs in 3 months ( please dietician note from this hospitalization ). He is incontinent of bladder and bowel. He is highly resistant to any ADL care. His ability to communicate has also swindled to  a few words, and occasional sentence. He opened his eyes when I said his name but would not answer any questions. He did eat breakfast in the hospital today but nothing since and has been asleep. He has to be fed,.Daughter and son have been sharing care giving with a paid caregiver but can no longer take care of him in his apartment anymore. [place] brother. Specific issues: 1. I do  feel that pt  would meet criteria for hospice in a  facility given his advanced dementia. Pt and family do not want any artifical hydration or feeding tubes. They also would not treat any infections at this point They would like to see him have as much dignity and comfort as possible, and no longer see him as able to participate in rehab. They recognize he will sleep most of the day and likely will quit eating altogether in near future. They support comfort feeding only and mobility as only pt desires to perform, or is able at this point.  2. Wanting SNF placement  with hospice coming into facility to support pt and family goals. Pt already resides at [facility] and that would be their first choice as it was their impression that when he "bought into facility" that this was part of continuity of care that was offered. Otherwise would need additional options  Code Status/Advance Care Planning:  DNR  Symptom Management:   Back pain: Contineu Fentnayl td 25 mcg with tylenol   Psycho-social/Spiritual:   Support System: Daughter and son. He is a widower  Desire for further  Chaplaincy support:no  Prognosis: < 6 months  Discharge Planning:  [place] with Hospice       Chief Complaint/History of Present Illness: Pt is a 81 yo man admitted with failure to thrive, acute kidney injury from dehydration,, and pain from T12 compression fx. He has had a marked functional decline since October of 2015 and more so since March of this year after a fall where he had a compression fx. Since that time he has last 10% of his body weight, usually refuses to eat anything and sleeps 20 hours a day.   Primary Diagnoses  Present on Admission:  . FTT (failure to thrive) in adult . AKI (acute kidney injury) . Dehydration . Ischemic cardiomyopathy . Vertebral compression fracture . Paroxysmal atrial fibrillation . Pacemaker-St.Jude  Palliative Review of Systems: Unable to perform. Pt minimally verbal I have reviewed the medical record, interviewed the patient and family, and examined the patient. The following aspects are pertinent.  Past Medical History  Diagnosis Date  . ANEMIA-IRON DEFICIENCY 05/27/2007  . ANXIETY 11/14/2007  . Intermittent high-grade heart block 09/05/2009    s/p pacemaker  . BENIGN PROSTATIC HYPERTROPHY 11/14/2007  . CAROTID ARTERY DISEASE 10/02/2010  . Pacemaker-St. Jude 02/02/2008    St. Jude  . COLONIC POLYPS, HX OF 05/27/2007  . CORONARY ARTERY DISEASE 11/14/2007    prior bypass [phone]; Inferior MI 11/07; Severe 3 vessel disease; occluded SVG to RCA; SVG to OM subtotalled, SVG to diagonal with ostial 70 and 80 mid; EF 45. PCI of SVG to OM. FU myovue showed EF 56 with inferior infarct and very mild peri-infarct ischemia  . DEPRESSION 11/14/2007  . DIABETES MELLITUS, TYPE II 11/14/2007  . DIVERTICULITIS, HX OF 05/27/2007  . DVT (deep venous thrombosis) 03/21/2011  . FOOT PAIN, BILATERAL 12/08/2010  . GERD 11/14/2007  . HYPERLIPIDEMIA 11/14/2007  . HYPERTENSION 11/14/2007  . HYPOTENSION, ORTHOSTATIC 03/06/2011  . Memory loss  06/01/2008  . OSTEOPOROSIS 06/02/2010  . PERIPHERAL VASCULAR DISEASE 11/14/2007  . RENAL INSUFFICIENCY 11/14/2007  . SECONDARY DM W/PERIPHERAL CIRC D/O UNCONTROLLED 11/07/2010  . SYNCOPE 09/08/2008  . Unspecified hearing loss 05/13/2009  . Prostate cancer   . Myalgia and myositis, unspecified 04/24/2011  . Other malaise and fatigue 04/24/2011  . Peripheral neuropathy 03/27/2011  . Seborrheic keratosis 06/05/2011  . Osteoarthrosis, unspecified whether generalized or localized, unspecified site   . Ischemic cardiomyopathy     a. echo (10/15):  mod focal basal septal hypertrophy, EF 40-45%, inf AK, Gr 1 DD, Al sclerosis, mild AI, MAC, mild MR, mod LAE, lipomatous hypertrophy, small effusion (no hemodynamic compromise)   . Effusion of left knee 03/17/2015  . Severe back pain   . DM (diabetes mellitus), type 2 with renal complications 9/32/3557   History   Social History  . Marital Status: Widowed    Spouse Name: N/A  . Number of Children: 2  . Years of Education: College   Occupational History  . 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 Smoking status: Former Smoker -- 1.00 packs/day for 50 years    Types: Cigarettes    Quit date: 10/18/1983  . Smokeless tobacco: Never Used  . Alcohol Use: No  . Drug Use: No  . Sexual Activity: No   Other Topics Concern  . None   Social History Narrative   Patient is widowed with 2 children.   Patient has a college education.   Patient is right handed.   Patient drinks 2 cups daily.   Walking with wallker   Family History  Problem Relation Age of Onset  . Diabetes Father     24  . Cancer Mother    Scheduled Meds: . acetaminophen  1,000 mg Oral BID  . feeding supplement (GLUCERNA SHAKE)  237 mL Oral BID BM  . fentaNYL  25 mcg Transdermal Q72H  . insulin aspart  0-15 Units Subcutaneous TID WC  . insulin aspart  0-5 Units Subcutaneous QHS  . megestrol  400 mg Oral Daily  . memantine  28 mg Oral Daily  .  Rivaroxaban  15 mg Oral Q supper  . tamsulosin  0.4 mg Oral QPC breakfast   Continuous Infusions:  PRN Meds:.ondansetron **OR** ondansetron (ZOFRAN) IV Medications Prior to Admission:  Prior to Admission medications   Medication Sig Start Date End Date Taking? Authorizing Provider  acetaminophen (TYLENOL) 500 MG tablet Take 500 mg by mouth every 8 (eight) hours as needed for mild pain or moderate pain.   Yes Historical Provider, MD  fentaNYL (DURAGESIC) 25 MCG/HR patch Apply fresh patch every 3 days to help pain. Remove old patch. 05/17/15  Yes Estill Dooms, MD  losartan (COZAAR) 25 MG tablet Take 1 tablet (25 mg total) by mouth daily. 05/11/15  Yes Estill Dooms, MD  megestrol (MEGACE ES) 625 MG/5ML suspension 5 cc daily to stimulate appetite Patient taking differently: Take 625 mg by mouth daily.  05/17/15  Yes Estill Dooms, MD  memantine (NAMENDA XR) 28 MG CP24 24 hr capsule Take 1 capsule (28 mg total) by mouth daily. 02/14/15  Yes Dennie Bible, NP  nitroGLYCERIN (NITROSTAT) 0.4 MG SL tablet Place 0.4 mg under the tongue every 5 (five) minutes as needed for chest pain.   Yes Historical Provider, MD  Polyethyl Glycol-Propyl Glycol 0.4-0.3 % GEL Apply 1 drop to eye daily as needed (dry eyes).   Yes Historical Provider, MD  Rivaroxaban (XARELTO) 15 MG TABS tablet Take 1 tablet (15 mg total) by mouth daily with supper. 10/27/14  Yes Lelon Perla, MD  tamsulosin (FLOMAX) 0.4 MG CAPS capsule Take 0.4 mg by mouth daily after breakfast. For bladder 07/14/13  Yes Historical Provider, MD  alendronate (FOSAMAX) 70 MG tablet TAKE 1 TAB ONCE A WK AT LEAST 30 MIN BEFORE 1ST FOOD.DO NOT LIE DOWN FOR 30 MIN AFTER TAKING. Patient not taking: Reported on 05/17/2015 10/25/14   Estill Dooms, MD  atorvastatin (LIPITOR) 40 MG tablet TAKE 1 TABLET ONCE A DAY TO CONTROL CHOLESTEROL. Patient not taking: Reported on 05/17/2015 04/29/15   Estill Dooms, MD  docusate sodium (COLACE) 100 MG capsule Take 1  capsule (100 mg total) by mouth every 12 (twelve) hours. Patient not taking: Reported on 05/20/2015 04/25/15   Jola Schmidt, MD  feeding supplement, GLUCERNA SHAKE, (GLUCERNA SHAKE) LIQD Take 237 mLs by mouth 2 (two) times daily between meals. Patient not taking: Reported on 05/20/2015 10/10/14   Nicki Reaper  T Weaver, PA-C  fenofibrate 160 MG tablet TAKE 1 TABLET ONCE A DAY TO CONTROL CHOLESTEROL. Patient not taking: Reported on 05/17/2015 03/10/15   Estill Dooms, MD  furosemide (LASIX) 20 MG tablet Take 1 tablet (20 mg total) by mouth every other day. Patient not taking: Reported on 05/17/2015 04/29/15   Estill Dooms, MD  metFORMIN (GLUCOPHAGE) 500 MG tablet TAKE 1 TABLET EACH MORNING TO CONTROL GLUCOSE. Patient not taking: Reported on 05/20/2015 02/18/15   Estill Dooms, MD   Allergies  Allergen Reactions  . Hydrocodone     hallucination  . Tramadol     hallucination   CBC:    Component Value Date/Time   WBC 5.4 05/22/2015 0533   WBC 9.6 05/03/2015 1040   HGB 11.9* 05/22/2015 0533   HCT 35.3* 05/22/2015 0533   HCT 42.1 05/03/2015 1040   PLT 126* 05/22/2015 0533   MCV 90.1 05/22/2015 0533   NEUTROABS 5.9 05/20/2015 1341   NEUTROABS 6.8 05/03/2015 1040   LYMPHSABS 1.4 05/20/2015 1341   LYMPHSABS 1.8 05/03/2015 1040   MONOABS 0.7 05/20/2015 1341   EOSABS 0.3 05/20/2015 1341   BASOSABS 0.0 05/20/2015 1341   BASOSABS 0.0 05/03/2015 1040   Comprehensive Metabolic Panel:    Component Value Date/Time   NA 135 05/22/2015 0533   NA 136* 05/12/2015   K 4.4 05/22/2015 0533   CL 107 05/22/2015 0533   CO2 21* 05/22/2015 0533   BUN 33* 05/22/2015 0533   BUN 57* 05/12/2015   CREATININE 0.92 05/22/2015 0533   CREATININE 1.3 05/12/2015   CREATININE 1.21 12/09/2014 1124   GLUCOSE 126* 05/22/2015 0533   GLUCOSE 205* 05/03/2015 1040   GLUCOSE 121* 01/02/2007 0858   CALCIUM 8.6* 05/22/2015 0533   CALCIUM 9.5 06/02/2010 2048   AST 35 05/20/2015 1341   ALT 19 05/20/2015 1341   ALKPHOS 122  05/20/2015 1341   BILITOT 0.9 05/20/2015 1341   BILITOT 0.8 05/03/2015 1040   PROT 6.8 05/20/2015 1341   PROT 7.1 05/03/2015 1040   ALBUMIN 3.1* 05/20/2015 1341    Physical Exam: Vital Signs: BP 112/57 mmHg  Pulse 73  Temp(Src) 98.1 F (36.7 C) (Oral)  Resp 18  Ht _0  (1.753 m)  Wt 75.796 kg (167 lb 1.6 oz)  BMI 24.67 kg/m2  SpO2 98% SpO2: SpO2: 98 % O2 Device: O2 Device: Nasal Cannula O2 Flow Rate: O2 Flow Rate (L/min): 1 L/min Intake/output summary:  Intake/Output Summary (Last 24 hours) at 05/22/15 1758 Last data filed at 05/22/15 1607  Gross per 24 hour  Intake    236 ml  Output    825 ml  Net   -589 ml   LBM:   Baseline Weight: Weight: 75.796 kg (167 lb 1.6 oz) Most recent weight: Weight: 75.796 kg (167 lb 1.6 oz)  Exam Findings:  General : Elderly frail man, lying in bed. Will only open his eyes to voice. Not conversant Resp: Unlabored Cardiac: +JVD          Palliative Performance Scale: 20-30%              Additional Data Reviewed: Recent Labs     05/21/15  0523  05/22/15  0533  WBC  6.3  5.4  HGB  11.5*  11.9*  PLT  147*  126*  NA  136  135  BUN  57*  33*  CREATININE  1.47*  0.92     Time In: 1620 Time Out: 1745 Time Total: 85  min  Greater than 50%  of this time was spent counseling and coordinating care related to the above assessment and plan. Will re-consult SW for placement with hospice coming into facility  Signed by: Dory Horn, NP  Dory Horn, NP  05/22/2015, 5:58 PM  Please contact Palliative Medicine Team phone at [phone] for questions and concerns.

## 2015-05-23 DIAGNOSIS — E43 Unspecified severe protein-calorie malnutrition: Secondary | ICD-10-CM | POA: Diagnosis present

## 2015-05-23 HISTORY — DX: Unspecified severe protein-calorie malnutrition: E43

## 2015-05-23 LAB — GLUCOSE, CAPILLARY
GLUCOSE-CAPILLARY: 232 mg/dL — AB (ref 65–99)
Glucose-Capillary: 109 mg/dL — ABNORMAL HIGH (ref 65–99)

## 2015-05-23 MED ORDER — ACETAMINOPHEN 500 MG PO TABS: 1000.0000 mg | ORAL_TABLET | Freq: Two times a day (BID) | ORAL | Status: DC

## 2015-05-23 MED ORDER — GLUCERNA SHAKE PO LIQD: 237.0000 mL | Freq: Two times a day (BID) | ORAL | Status: DC

## 2015-05-23 MED ORDER — FENTANYL 25 MCG/HR TD PT72: MEDICATED_PATCH | TRANSDERMAL | Status: DC

## 2015-05-23 MED ORDER — SODIUM CHLORIDE 0.9 % IV BOLUS (SEPSIS)
500.0000 mL | Freq: Once | INTRAVENOUS | Status: AC
  Administered 2015-05-23: 500 mL via INTRAVENOUS

## 2015-05-23 NOTE — Discharge Summary (Signed)
Physician Discharge Summary  Zachary Harris BJS:[id number] DOB: [birth year] DOA: 05/20/2015  PCP: Estill Dooms, MD  Admit date: 05/20/2015 Discharge date: 05/23/2015  Time spent: 35 minutes  Recommendations for Outpatient Follow-up:  1. During this hospitalization patient was transitioned to comfort care. Palliative care consulted and family members wished to focus his care to comfort, avoiding any further potential burdensome or invasive interventions.  2. He was discharged to [place] with Hospice Consult.   Discharge Diagnoses:  Principal Problem:   FTT (failure to thrive) in adult Active Problems:   Vertebral compression fracture   Pacemaker-St.Jude   Ischemic cardiomyopathy   Paroxysmal atrial fibrillation   Compression fracture   Generalized weakness   AKI (acute kidney injury)   Dehydration   Palliative care encounter   Protein-calorie malnutrition, severe   Discharge Condition:  Stable  Diet recommendation: As tolerated  Filed Weights   05/20/15 1930  Weight: 75.796 kg (167 lb 1.6 oz)    History of present illness:   Zachary Harris is a 81 y.o. male with a past medical history of dementia, coronary artery disease, status post coronary artery bypass grafting in 6073, chronic systolic congestive heart failure, history of atrial fibrillation on chronic anticoagulation with Xarelto, hypertension, who was referred to the emergency department by his primary care physician for failure to thrive. Patient having back pain in the month of April for which lumbar x-ray was ordered showing progressive compression fracture at T12 compared to prior study on 10/14/2013 with continued loss of vertebral body height now at approximately 50%. Family members reporting that since then he has had a progressive functional decline/failure to thrive. Family members reporting patient having poor oral intake, having excessive daytime sleepiness, becoming less active, requiring greater  assistance for activities of daily living. Patient was prescribed fentanyl 25 mcg patch every 72 hours for pain relief. He was also recently prescribed megestrol. Despite these interventions along with outpatient physical therapy he has continued to decline. History was obtained from his daughter at bedside, patient unable to provide history or participate in his own plan of care.  Hospital Course:  Patient is an 81 year old woman with a past medical history of advanced dementia, coronary disease, chronic systolic congestive heart failure, history of atrial fibrillation, admitted to the medicine service on 05/20/2015 presenting with failure to thrive/functional decline. Family members had reported a progressive functional decline over the past month. He was found to have progressive compression fracture at T12 on x-ray performed on 04/25/2015. On presentation he appeared dry and was hypotensive likely resulting from profound dehydration. Labs showed acute on chronic renal failure. He was gently hydrated with IV fluid resuscitation as his creatinine improved from 0.67-1.47 on. Physical therapy was consulted. Despite IV fluid resuscitation, correction of electrolytes, correction of hypotension and acute kidney injury, patient failed to show significant improvement. [name] for the most part resisting attempts to assist with out of bed to sit on bedside chair along with eating. He preferred to lay in bed and sleep. Family members requested a palliative care consult. He was evaluated by palliative care on 05/22/2015 at which point goals of care were discussed with his daughter. Family members wished to minimize further invasive or burdensome procedures and focus in his care on call for. They were not want patient undergo any type of artificial nutrition. Furthermore they were not want patient to be subjected to the possible discomfort/agressive intervention of future hospitalizations. Patient to be discharged to  skilled nursing facility  with hospice services continuing to provide care.   Consultations:  Palliative care  Discharge Exam: Filed Vitals:   05/23/15 1125  BP: 139/62  Pulse:   Temp:   Resp:     General: Patient is arousable to voice, states feeling "as good as I can be" does not want to get out of bed, states not feeling hungry Cardiovascular: Regular rate and rhythm normal S1-S2 Respiratory: Has few bibasilar crackles otherwise normal respiratory effort, breathing comfortably on room air Abdomen: Soft nontender nondistended Extremities: Significant muscle atrophy  Discharge Instructions   Discharge Instructions    Call MD for:  difficulty breathing, headache or visual disturbances    Complete by:  As directed      Call MD for:  extreme fatigue    Complete by:  As directed      Call MD for:  hives    Complete by:  As directed      Call MD for:  persistant dizziness or light-headedness    Complete by:  As directed      Call MD for:  persistant nausea and vomiting    Complete by:  As directed      Call MD for:  redness, tenderness, or signs of infection (pain, swelling, redness, odor or green/yellow discharge around incision site)    Complete by:  As directed      Call MD for:  severe uncontrolled pain    Complete by:  As directed      Call MD for:  temperature >100.4    Complete by:  As directed      Call MD for:    Complete by:  As directed      Diet - low sodium heart healthy    Complete by:  As directed      Increase activity slowly    Complete by:  As directed           Current Discharge Medication List    CONTINUE these medications which have CHANGED   Details  acetaminophen (TYLENOL) 500 MG tablet Take 2 tablets (1,000 mg total) by mouth 2 (two) times daily. Qty: 30 tablet, Refills: 0    feeding supplement, GLUCERNA SHAKE, (GLUCERNA SHAKE) LIQD Take 237 mLs by mouth 2 (two) times daily between meals. Qty: 20 Can, Refills: 0    fentaNYL (DURAGESIC)  25 MCG/HR patch Apply fresh patch every 3 days to help pain. Remove old patch. Qty: 10 patch, Refills: 0   Associated Diagnoses: Midline low back pain without sciatica; Vertebral compression fracture, with routine healing, subsequent encounter      CONTINUE these medications which have NOT CHANGED   Details  megestrol (MEGACE ES) 625 MG/5ML suspension 5 cc daily to stimulate appetite Qty: 150 mL, Refills: 3   Associated Diagnoses: Failure to thrive in adult; Weight loss    memantine (NAMENDA XR) 28 MG CP24 24 hr capsule Take 1 capsule (28 mg total) by mouth daily. Qty: 30 capsule, Refills: 6    nitroGLYCERIN (NITROSTAT) 0.4 MG SL tablet Place 0.4 mg under the tongue every 5 (five) minutes as needed for chest pain.    tamsulosin (FLOMAX) 0.4 MG CAPS capsule Take 0.4 mg by mouth daily after breakfast. For bladder    docusate sodium (COLACE) 100 MG capsule Take 1 capsule (100 mg total) by mouth every 12 (twelve) hours. Qty: 60 capsule, Refills: 0      STOP taking these medications     losartan (COZAAR) 25 MG tablet  Polyethyl Glycol-Propyl Glycol 0.4-0.3 % GEL      Rivaroxaban (XARELTO) 15 MG TABS tablet      alendronate (FOSAMAX) 70 MG tablet      atorvastatin (LIPITOR) 40 MG tablet      fenofibrate 160 MG tablet      furosemide (LASIX) 20 MG tablet      metFORMIN (GLUCOPHAGE) 500 MG tablet        Allergies  Allergen Reactions  . Hydrocodone     hallucination  . Tramadol     hallucination   Follow-up Information    Follow up with GREEN, Viviann Spare, MD In 1 week.   Specialty:  Internal Medicine   Contact information:   [place] [address] [phone]        The results of significant diagnostics from this hospitalization (including imaging, microbiology, ancillary and laboratory) are listed below for reference.    Significant Diagnostic Studies: Dg Chest 2 View  05/20/2015   CLINICAL DATA:  Pt presents to department for evaluation of  failure to thrive. Daughter reports frequent falls, decreased appetite, weight loss, chronic back pain and confusion. Pt is alert upon arrival to ED. History of CHF, pacemaker, and MI. Wears 2L O2. Currently living in independent living. Pt has hx of HTN, diabetes, GERD. Non-Smoker.  EXAM: CHEST  2 VIEW  COMPARISON:  10/08/2014  FINDINGS: Stable changes from previous CABG surgery. Cardiac silhouette is mildly enlarged. Aorta is uncoiled. No mediastinal or hilar masses or evidence of adenopathy.  Left anterior chest wall sequential pacemaker is stable and well positioned.  Mild bilateral interstitial thickening. No lung consolidation. No convincing pulmonary edema. No pleural effusion or pneumothorax.  Bony thorax is demineralized.  IMPRESSION: No acute cardiopulmonary disease.   Electronically Signed   By: Lajean Manes M.D.   On: 05/20/2015 15:22   Dg Lumbar Spine Complete  04/25/2015   CLINICAL DATA:  Low back pain for several days without acute injury  EXAM: LUMBAR SPINE - COMPLETE 4+ VIEW  COMPARISON:  10/14/2013  FINDINGS: There is progressive compression deformity at T12. Approximately 50% loss vertebral body height on today's exam compared to 30% on prior. There is endplate sclerosis at I5-O2 L3-L4 and L4-L5 with associated osteophytosis. No subluxation.  IMPRESSION: 1. No acute fracture. 2. Progressive compression fracture at T12 compared to 10/14/2013 with continued loss of vertebral body height now with approximately 50% vertebral body height. 3. Multilevel disc osteophytic disease. 4. Atherosclerotic calcification aorta.   Electronically Signed   By: Suzy Bouchard M.D.   On: 04/25/2015 19:23    Microbiology: No results found for this or any previous visit (from the past 240 hour(s)).   Labs: Basic Metabolic Panel:  Recent Labs Lab 05/20/15 1341 05/21/15 0523 05/22/15 0533  NA 137 136 135  K 3.8 3.4* 4.4  CL 104 104 107  CO2 23 25 21*  GLUCOSE 149* 137* 126*  BUN 64* 57* 33*   CREATININE 1.67* 1.47* 0.92  CALCIUM 9.5 8.8* 8.6*   Liver Function Tests:  Recent Labs Lab 05/20/15 1341  AST 35  ALT 19  ALKPHOS 122  BILITOT 0.9  PROT 6.8  ALBUMIN 3.1*   No results for input(s): LIPASE, AMYLASE in the last 168 hours. No results for input(s): AMMONIA in the last 168 hours. CBC:  Recent Labs Lab 05/20/15 1341 05/21/15 0523 05/22/15 0533  WBC 8.3 6.3 5.4  NEUTROABS 5.9  --   --   HGB 13.1 11.5* 11.9*  HCT 39.0 34.6* 35.3*  MCV 90.9 90.1 90.1  PLT 182 147* 126*   Cardiac Enzymes: No results for input(s): CKTOTAL, CKMB, CKMBINDEX, TROPONINI in the last 168 hours. BNP: BNP (last 3 results)  Recent Labs  03/15/15 1039  BNP 213.8*    ProBNP (last 3 results)  Recent Labs  10/08/14 1045  PROBNP 678.3*    CBG:  Recent Labs Lab 05/22/15 1156 05/22/15 1656 05/22/15 2126 05/23/15 0640 05/23/15 1142  GLUCAP 120* 124* 165* 109* 232*       Signed:  Leafy Motsinger  [facility] 05/23/2015, 12:35 PM

## 2015-05-23 NOTE — Clinical Documentation Improvement (Signed)
Presents with Failure to Thrive, ARF, Chronic Systolic CHF, I97 Compression Fracture.   Nutritional Consult notes Severe Malnutrition with 20 pound weight loss in last 3 months  BMI = 24.67  Recommendations are for: Magic Cup at lunch; Glucerna Shake PO BID  Severe Malnutrition not noted on Problem List  Please assess consult and render an opinion in next progress note and include in discharge summary if applicable.  Other Condition Cannot clinically determine  Thank You, Zoila Shutter ,RN Clinical Documentation Specialist:  [place]

## 2015-05-23 NOTE — Clinical Social Work Placement (Signed)
   CLINICAL SOCIAL WORK PLACEMENT  NOTE  Date:  05/23/2015  Patient Details  Name: Zachary Harris MRN: [id number] Date of Birth: 28-Nov-1923  Clinical Social Work is seeking post-discharge placement for this patient at the [place] level of care (*CSW will initial, date and re-position this form in  chart as items are completed):  Yes   Patient/family provided with [place] Work Department's list of facilities offering this level of care within the geographic area requested by the patient (or if unable, by the patient's family).  Yes   Patient/family informed of their freedom to choose among providers that offer the needed level of care, that participate in Medicare, Medicaid or managed care program needed by the patient, have an available bed and are willing to accept the patient.  Yes   Patient/family informed of [facility] ownership interest in [facility] and [facility], as well as of the fact that they are under no obligation to receive care at these facilities.  PASRR submitted to EDS on 05/23/15     PASRR number received on 05/23/15     Existing PASRR number confirmed on       FL2 transmitted to all facilities in geographic area requested by pt/family on 05/23/15     FL2 transmitted to all facilities within larger geographic area on       Patient informed that his/her managed care company has contracts with or will negotiate with certain facilities, including the following:        Yes   Patient/family informed of bed offers received.  Patient chooses bed at [facility]     Physician recommends and patient chooses bed at      Patient to be transferred to [facility] on 05/23/15.  Patient to be transferred to facility by [facility]      Patient family notified on 05/23/15 of transfer.  Name of family member notified:  Pt's daughter Anda Kraft     PHYSICIAN       Additional Comment:     _______________________________________________ Greta Doom, LCSW 05/23/2015, 1:04 PM

## 2015-05-23 NOTE — Clinical Social Work Note (Signed)
Clinical Social Work Assessment  Patient Details  Name: Zachary Harris MRN: [id number] Date of Birth: 04/04/1923  Date of referral:  05/21/15               Reason for consult:  End of Life/Hospice, Facility Placement, Family Concerns, Discharge Planning                Housing/Transportation Living arrangements for the past 2 months:  [place] of Information:  Adult Children Patient Interpreter Needed:  None Criminal Activity/Legal Involvement Pertinent to Current Situation/Hospitalization:  No - Comment as needed Significant Relationships:  Adult Children  Lives with: self  Do you feel safe going back to the place where you live?  No  Need for family participation in patient care:  Yes (Comment)  Care giving concerns:  The pt's daughter Ms. Rozetta Nunnery reported that the can not take care of himself.   Social Worker assessment / plan:  CSW met with the pt's daughter. CSW and Ms. Rozetta Nunnery dicsussed the pt lived at [facility]. Ms. Rozetta Nunnery reported that she would like the pt to go to [facility] (SNF with Hospice Care).   Employment status:  Retired Nurse, adult PT Recommendations:  Not assessed at this time Information / Referral to community resources:  Hospice List   Patient/Family's Response to care:  Ms. Rozetta Nunnery expressed gratitude about the care in which the pt is receiving.    Patient/Family's Understanding of and Emotional Response to Diagnosis, Current Treatment, and Prognosis:  Ms. Rozetta Nunnery shared a detail story with the CSW regarding the pt medical health history. Mr. Rozetta Nunnery become tearful while sharing her story. Mr. Rozetta Nunnery reported not wanting the pt to suffer.    Emotional Assessment Appearance:  Appears stated age Attitude/Demeanor/Rapport:  Unable to Assess Affect (typically observed):  Unable to Assess Orientation:  Oriented to Self, Oriented to Place, Oriented to Situation Alcohol / Substance use:  Not Applicable Psych  involvement (Current and /or in the community):  No (Comment)  Discharge Needs  Concerns to be addressed:  Discharge Planning Concerns Readmission within the last 30 days:  No Current discharge risk:  Physical Impairment, Cognitively Impaired, Lives alone, Dependent with Mobility Barriers to Discharge:  No Barriers Identified   Keino Placencia, LCSW 05/23/2015, 12:52 PM

## 2015-05-23 NOTE — Progress Notes (Addendum)
Amion text to MD. BP 80/35.

## 2015-05-23 NOTE — Progress Notes (Signed)
DC summary provided to daughter, at her request. Report called to [place], all questions answered.

## 2015-05-24 ENCOUNTER — Non-Acute Institutional Stay (SKILLED_NURSING_FACILITY): Payer: Medicare Other | Admitting: Nurse Practitioner

## 2015-05-24 ENCOUNTER — Encounter: Payer: Self-pay | Admitting: Nurse Practitioner

## 2015-05-24 DIAGNOSIS — E43 Unspecified severe protein-calorie malnutrition: Secondary | ICD-10-CM | POA: Diagnosis not present

## 2015-05-24 DIAGNOSIS — I10 Essential (primary) hypertension: Secondary | ICD-10-CM

## 2015-05-24 DIAGNOSIS — I480 Paroxysmal atrial fibrillation: Secondary | ICD-10-CM | POA: Diagnosis not present

## 2015-05-24 DIAGNOSIS — N400 Enlarged prostate without lower urinary tract symptoms: Secondary | ICD-10-CM

## 2015-05-24 DIAGNOSIS — M4850XD Collapsed vertebra, not elsewhere classified, site unspecified, subsequent encounter for fracture with routine healing: Secondary | ICD-10-CM

## 2015-05-24 DIAGNOSIS — E1142 Type 2 diabetes mellitus with diabetic polyneuropathy: Secondary | ICD-10-CM | POA: Diagnosis not present

## 2015-05-24 DIAGNOSIS — R413 Other amnesia: Secondary | ICD-10-CM | POA: Diagnosis not present

## 2015-05-24 DIAGNOSIS — I2510 Atherosclerotic heart disease of native coronary artery without angina pectoris: Secondary | ICD-10-CM | POA: Diagnosis not present

## 2015-05-24 DIAGNOSIS — G629 Polyneuropathy, unspecified: Secondary | ICD-10-CM

## 2015-05-24 DIAGNOSIS — R627 Adult failure to thrive: Secondary | ICD-10-CM

## 2015-05-24 DIAGNOSIS — IMO0001 Reserved for inherently not codable concepts without codable children: Secondary | ICD-10-CM

## 2015-05-24 DIAGNOSIS — M5440 Lumbago with sciatica, unspecified side: Secondary | ICD-10-CM

## 2015-05-24 DIAGNOSIS — I5042 Chronic combined systolic (congestive) and diastolic (congestive) heart failure: Secondary | ICD-10-CM

## 2015-05-24 DIAGNOSIS — I2583 Coronary atherosclerosis due to lipid rich plaque: Secondary | ICD-10-CM

## 2015-05-24 NOTE — Assessment & Plan Note (Signed)
Etiology of the back pain. Continue Fentanyl for pain control. Activity as tolerated.

## 2015-05-24 NOTE — Assessment & Plan Note (Signed)
Incontinent B+B. Dc Flomax. Observe for urinary retention. I+O as needed.

## 2015-05-24 NOTE — Assessment & Plan Note (Signed)
Progressing. Dc Namenda. SNF for care needs. Comfort measures.

## 2015-05-24 NOTE — Progress Notes (Signed)
Patient ID: Zachary Harris, male   DOB: 1923/05/02, 81 y.o.   MRN: [id number]   Code Status: DNR comfort measures.   Allergies  Allergen Reactions  . Hydrocodone     hallucination  . Tramadol     hallucination    Chief Complaint  Patient presents with  . Medical Management of Chronic Issues  . Acute Visit    back pain.   [facility] Kitchen Hospitalization Follow-up    HPI: Patient is a 81 y.o. male seen in the SNF at [facility] today for evaluation of back pain, FTT, and  chronic medical conditions.     Hospitalized 05/20/2015-05/23/2015 for back pain(Vertebral compression fx) and FTT. The patient was transitioned to comfort care. Palliative care consulted and family members wished to focus his care to comfort, avoiding any further potential burdensome or invasive interventions.   Palliative care        ED eval 03/15/15 bilateral knee pain after fall. X-ray show effusion of the left knee. No fractures or dislocations, arthroplasty is intact bilaterally. As he is on xarelto, he is not a good candidate for arthrocentesis to drain effusion.  Problem List Items Addressed This Visit    Essential hypertension (Chronic)    Blood pressure is controlled. off Losartan 25mg         CAD (coronary artery disease) (Chronic)    No angina since admitted to SNF. Prn NTG, statin, and off Xarelto      Backache (Chronic)    Continue Fentanyl patch. Activity as tolerated. Desires no further tx.       Memory loss (Chronic)    Progressing. Dc Namenda. SNF for care needs. Comfort measures.       DM type 2 with diabetic peripheral neuropathy (Chronic)    Comfort measures.       Chronic combined systolic and diastolic CHF (congestive heart failure) (Chronic)    O2 dependent, DOE apparent, activity as tolerated, diurese as needed to ensure comfort care.       Failure to thrive in adult - Primary (Chronic)    Lost about #20Ibs recently. ADL dependency. Will discontinue Megace, Namenda, and  Flomax-desires no diagnostic testing, consultations, aggressive tx. Plan of care is comfort measures. May have PT to eval and tx as indicated       Enlarged prostate without urinary obstruction    Incontinent B+B. Dc Flomax. Observe for urinary retention. I+O as needed.       Vertebral compression fracture    Etiology of the back pain. Continue Fentanyl for pain control. Activity as tolerated.       Paroxysmal atrial fibrillation    Rate is in control w/o medications.       Protein-calorie malnutrition, severe    Diet as tolerated. Comfort care         Review of Systems:  Review of Systems  Constitutional: Negative for fever, chills, activity change, appetite change, fatigue and unexpected weight change.  HENT: Positive for hearing loss. Negative for ear pain and sinus pressure.   Eyes: Negative for photophobia, pain and visual disturbance.       Corrective lenses  Respiratory: Positive for shortness of breath. Negative for cough, chest tightness and wheezing.   Cardiovascular: Negative for leg swelling.       Pacemaker. History of syncope related to cardiac arrhythmia.  Gastrointestinal: Negative for abdominal pain and abdominal distention.  Endocrine: Negative for cold intolerance, heat intolerance, polydipsia, polyphagia and polyuria.       History of  diabetes.  Genitourinary: Positive for urgency and frequency. Negative for dysuria and flank pain.       Urge incontinence. Increased frequency which has not responded well to [place]. Continues to see urologist. Now taking oxybutynin 3 times a day. Episodes of incontinence persist.  Musculoskeletal: Positive for myalgias, back pain, arthralgias, gait problem and neck pain (Fall on 03/15/15 causing pain in the left knee and effusion.).       Poor balance and gait disturbance. Lower back pain  Skin:       Rough area on left cheek  Allergic/Immunologic: Negative.   Neurological: Negative for tremors, seizures, facial asymmetry  and weakness.       Memory loss  Hematological: Negative.   Psychiatric/Behavioral: Positive for confusion.       Mild to moderate memory disturbance.     Past Medical History  Diagnosis Date  . ANEMIA-IRON DEFICIENCY 05/27/2007  . ANXIETY 11/14/2007  . Intermittent high-grade heart block 09/05/2009    s/p pacemaker  . BENIGN PROSTATIC HYPERTROPHY 11/14/2007  . CAROTID ARTERY DISEASE 10/02/2010  . Pacemaker-St. Jude 02/02/2008    [employer]. Jude  . COLONIC POLYPS, HX OF 05/27/2007  . CORONARY ARTERY DISEASE 11/14/2007    prior bypass [phone]; Inferior MI 11/07; Severe 3 vessel disease; occluded SVG to RCA; SVG to OM subtotalled, SVG to diagonal with ostial 70 and 80 mid; EF 45. PCI of SVG to OM. FU myovue showed EF 56 with inferior infarct and very mild peri-infarct ischemia  . DEPRESSION 11/14/2007  . DIABETES MELLITUS, TYPE II 11/14/2007  . DIVERTICULITIS, HX OF 05/27/2007  . DVT (deep venous thrombosis) 03/21/2011  . FOOT PAIN, BILATERAL 12/08/2010  . GERD 11/14/2007  . HYPERLIPIDEMIA 11/14/2007  . HYPERTENSION 11/14/2007  . HYPOTENSION, ORTHOSTATIC 03/06/2011  . Memory loss 06/01/2008  . OSTEOPOROSIS 06/02/2010  . PERIPHERAL VASCULAR DISEASE 11/14/2007  . RENAL INSUFFICIENCY 11/14/2007  . SECONDARY DM W/PERIPHERAL CIRC D/O UNCONTROLLED 11/07/2010  . SYNCOPE 09/08/2008  . Unspecified hearing loss 05/13/2009  . Prostate cancer   . Myalgia and myositis, unspecified 04/24/2011  . Other malaise and fatigue 04/24/2011  . Peripheral neuropathy 03/27/2011  . Seborrheic keratosis 06/05/2011  . Osteoarthrosis, unspecified whether generalized or localized, unspecified site   . Ischemic cardiomyopathy     a. echo (10/15):  mod focal basal septal hypertrophy, EF 40-45%, inf AK, Gr 1 DD, Al sclerosis, mild AI, MAC, mild MR, mod LAE, lipomatous hypertrophy, small effusion (no hemodynamic compromise)   . Effusion of left knee 03/17/2015  . Severe back pain   . DM (diabetes mellitus), type 2 with renal  complications 5/80/9983   Past Surgical History  Procedure Laterality Date  . S/p coronary stent x 1    . Cholecystectomy    . S/p bilat knee replacement  Cleon Gustin, MD  . Pacemaker insertion  2012    Caryl Comes, MD  . Prostate needle biopsy    . Cystoscopy    . Appendectomy    . Coronary artery bypass graft  2007   Social History:   reports that he quit smoking about 31 years ago. His smoking use included Cigarettes. He has a 50 pack-year smoking history. He has never used smokeless tobacco. He reports that he does not drink alcohol or use illicit drugs.  Family History  Problem Relation Age of Onset  . Diabetes Father     91  . Cancer Mother     Medications: Patient's Medications  New Prescriptions   No  medications on file  Previous Medications   ACETAMINOPHEN (TYLENOL) 500 MG TABLET    Take 2 tablets (1,000 mg total) by mouth 2 (two) times daily.   DOCUSATE SODIUM (COLACE) 100 MG CAPSULE    Take 1 capsule (100 mg total) by mouth every 12 (twelve) hours.   FEEDING SUPPLEMENT, GLUCERNA SHAKE, (GLUCERNA SHAKE) LIQD    Take 237 mLs by mouth 2 (two) times daily between meals.   FENTANYL (DURAGESIC) 25 MCG/HR PATCH    Apply fresh patch every 3 days to help pain. Remove old patch.   NITROGLYCERIN (NITROSTAT) 0.4 MG SL TABLET    Place 0.4 mg under the tongue every 5 (five) minutes as needed for chest pain.  Modified Medications   No medications on file  Discontinued Medications   MEGESTROL (MEGACE ES) 625 MG/5ML SUSPENSION    5 cc daily to stimulate appetite   MEMANTINE (NAMENDA XR) 28 MG CP24 24 HR CAPSULE    Take 1 capsule (28 mg total) by mouth daily.   TAMSULOSIN (FLOMAX) 0.4 MG CAPS CAPSULE    Take 0.4 mg by mouth daily after breakfast. For bladder     Physical Exam: Physical Exam  Constitutional: He is oriented to person, place, and time.  Frail  HENT:  Head: Normocephalic and atraumatic.  Nose: Nose normal.  Bilateral hearing aids.  Eyes: Conjunctivae are normal.  Pupils are equal, round, and reactive to light.  Prescription lenses.  Neck: Normal range of motion. Neck supple. No JVD present. No tracheal deviation present. No thyromegaly present.  Cardiovascular: Normal rate and regular rhythm.  Exam reveals no gallop and no friction rub.   Murmur heard.  Systolic murmur is present with a grade of 1/6  Absent DP and PT  Pulmonary/Chest: Effort normal and breath sounds normal. No respiratory distress. He has no wheezes. He has no rales. He exhibits no tenderness.  Using portable oxygen at 2 L/m today.  Abdominal: Soft. Bowel sounds are normal. He exhibits no distension and no mass. There is no tenderness.  Musculoskeletal: Normal range of motion. He exhibits no edema or tenderness.  Pain in the left SI joint area. Limps when walking. Favors the left side. Effusion of the left knee. Scars in the left knee from previous surgery. Tender at the left knee with movement of the patella. Positional lower back pain  Lymphadenopathy:    He has no cervical adenopathy.  Neurological: He is alert and oriented to person, place, and time. He has normal strength. A sensory deficit is present. No cranial nerve deficit. Gait abnormal.  07/13/14 MMSE 28/30. Passed clock drawing. Diminshed sensation to vabration and monofilament.  Skin: Skin is warm and dry. No erythema. No pallor.  Rough area on the left cheek. Cystic lesion in the mid forehead.  Psychiatric: He has a normal mood and affect. His behavior is normal. Thought content normal.  Patient is in denial regarding the extent of his memory deficit.   Filed Vitals:   05/24/15 0944  BP: 128/69  Pulse: 81  Temp: 97.7 F (36.5 C)  TempSrc: Tympanic  Resp: 18      Labs reviewed: Basic Metabolic Panel:  Recent Labs  07/30/14 1434  10/08/14 1603  05/20/15 1341 05/20/15 2038 05/21/15 0523 05/22/15 0533  NA 139  < >  --   < > 137  --  136 135  K 4.4  < >  --   < > 3.8  --  3.4* 4.4  CL 97  < >  --   < >  104  --  104 107  CO2 23  < >  --   < > 23  --  25 21*  GLUCOSE 130*  < >  --   < > 149*  --  137* 126*  BUN 26  < >  --   < > 64*  --  57* 33*  CREATININE 1.39*  < > 1.55*  < > 1.67*  --  1.47* 0.92  CALCIUM 9.6  < >  --   < > 9.5  --  8.8* 8.6*  MG  --   --  2.0  --   --   --   --   --   TSH 4.270  --  3.830  --   --  1.574  --   --   < > = values in this interval not displayed. Liver Function Tests:  Recent Labs  10/08/14 1021  05/03/15 1040 05/12/15 05/20/15 1341  AST 25  < > 21 24 35  ALT 11  < > 11 13 19   ALKPHOS 44  < > 66 86 122  BILITOT 0.9  --  0.8  --  0.9  PROT 7.3  --  7.1  --  6.8  ALBUMIN 3.6  --   --   --  3.1*  < > = values in this interval not displayed. No results for input(s): LIPASE, AMYLASE in the last 8760 hours. No results for input(s): AMMONIA in the last 8760 hours. CBC:  Recent Labs  05/03/15 1040 05/20/15 1341 05/21/15 0523 05/22/15 0533  WBC 9.6 8.3 6.3 5.4  NEUTROABS 6.8 5.9  --   --   HGB  --  13.1 11.5* 11.9*  HCT 42.1 39.0 34.6* 35.3*  MCV  --  90.9 90.1 90.1  PLT  --  182 147* 126*   Lipid Panel:  Recent Labs  11/01/14 02/07/15  CHOL 115 132  HDL 33* 33*  LDLCALC 48 76  TRIG 171* 115    Past Procedures:  08/09/14 CT head wo contrast:  IMPRESSION:  Abnormal CT head (without) demonstrating: 1. Moderate frontal, parietal, temporal atrophy. Mild ventriculomegaly on ex vacuo basis. 2. Mild chronic small vessel ischemic disease. 3. Right parietal convexity encephalomalacia, may represent chronic ischemic infarction. 4. No acute findings.  03/15/15 X-ray R knee  IMPRESSION: Negative for acute fracture or dislocation  03/15/15 X-ray L knee  IMPRESSION: Moderately large joint effusion. Negative for acute fracture or Dislocation. 05/20/15  CXR  IMPRESSION: No acute cardiopulmonary disease.  Assessment/Plan Failure to thrive in adult Lost about #20Ibs recently. ADL dependency. Will discontinue Megace, Namenda, and  Flomax-desires no diagnostic testing, consultations, aggressive tx. Plan of care is comfort measures. May have PT to eval and tx as indicated    Essential hypertension Blood pressure is controlled. off Losartan 25mg      CAD (coronary artery disease) No angina since admitted to SNF. Prn NTG, statin, and off Xarelto   Backache Continue Fentanyl patch. Activity as tolerated. Desires no further tx.    Memory loss Progressing. Dc Namenda. SNF for care needs. Comfort measures.    DM type 2 with diabetic peripheral neuropathy Comfort measures.    Chronic combined systolic and diastolic CHF (congestive heart failure) O2 dependent, DOE apparent, activity as tolerated, diurese as needed to ensure comfort care.    Enlarged prostate without urinary obstruction Incontinent B+B. Dc Flomax. Observe for urinary retention. I+O as needed.    Vertebral compression fracture Etiology of the back pain.  Continue Fentanyl for pain control. Activity as tolerated.    Paroxysmal atrial fibrillation Rate is in control w/o medications.    Protein-calorie malnutrition, severe Diet as tolerated. Comfort care     Family/ Staff Communication: observe the patient.   Goals of Care: SNF  Labs/tests ordered: none

## 2015-05-24 NOTE — Assessment & Plan Note (Signed)
Lost about #20Ibs recently. ADL dependency. Will discontinue Megace, Namenda, and Flomax-desires no diagnostic testing, consultations, aggressive tx. Plan of care is comfort measures. May have PT to eval and tx as indicated

## 2015-05-24 NOTE — Assessment & Plan Note (Signed)
Blood pressure is controlled. off Losartan 25mg 

## 2015-05-24 NOTE — Assessment & Plan Note (Signed)
No angina since admitted to SNF. Prn NTG, statin, and off Xarelto

## 2015-05-24 NOTE — Assessment & Plan Note (Signed)
O2 dependent, DOE apparent, activity as tolerated, diurese as needed to ensure comfort care.

## 2015-05-24 NOTE — Assessment & Plan Note (Signed)
Rate is in control w/o medications.

## 2015-05-24 NOTE — Assessment & Plan Note (Signed)
Comfort measures

## 2015-05-24 NOTE — Assessment & Plan Note (Signed)
Diet as tolerated. Comfort care

## 2015-05-24 NOTE — Assessment & Plan Note (Signed)
Continue Fentanyl patch. Activity as tolerated. Desires no further tx.

## 2015-05-26 ENCOUNTER — Non-Acute Institutional Stay (SKILLED_NURSING_FACILITY): Payer: Medicare Other | Admitting: Internal Medicine

## 2015-05-26 ENCOUNTER — Encounter: Payer: Self-pay | Admitting: Internal Medicine

## 2015-05-26 DIAGNOSIS — R262 Difficulty in walking, not elsewhere classified: Secondary | ICD-10-CM | POA: Diagnosis not present

## 2015-05-26 DIAGNOSIS — M25462 Effusion, left knee: Secondary | ICD-10-CM | POA: Diagnosis not present

## 2015-05-26 DIAGNOSIS — I10 Essential (primary) hypertension: Secondary | ICD-10-CM

## 2015-05-26 DIAGNOSIS — Z96653 Presence of artificial knee joint, bilateral: Secondary | ICD-10-CM | POA: Diagnosis not present

## 2015-05-26 DIAGNOSIS — R413 Other amnesia: Secondary | ICD-10-CM | POA: Diagnosis not present

## 2015-05-26 DIAGNOSIS — I5042 Chronic combined systolic (congestive) and diastolic (congestive) heart failure: Secondary | ICD-10-CM | POA: Diagnosis not present

## 2015-05-26 DIAGNOSIS — R634 Abnormal weight loss: Secondary | ICD-10-CM

## 2015-05-26 DIAGNOSIS — F329 Major depressive disorder, single episode, unspecified: Secondary | ICD-10-CM

## 2015-05-26 DIAGNOSIS — R531 Weakness: Secondary | ICD-10-CM

## 2015-05-26 DIAGNOSIS — E1122 Type 2 diabetes mellitus with diabetic chronic kidney disease: Secondary | ICD-10-CM

## 2015-05-26 DIAGNOSIS — M5440 Lumbago with sciatica, unspecified side: Secondary | ICD-10-CM

## 2015-05-26 DIAGNOSIS — E1022 Type 1 diabetes mellitus with diabetic chronic kidney disease: Secondary | ICD-10-CM | POA: Diagnosis not present

## 2015-05-26 DIAGNOSIS — N183 Chronic kidney disease, stage 3 unspecified: Secondary | ICD-10-CM

## 2015-05-26 DIAGNOSIS — R627 Adult failure to thrive: Secondary | ICD-10-CM

## 2015-05-26 DIAGNOSIS — R55 Syncope and collapse: Secondary | ICD-10-CM | POA: Diagnosis not present

## 2015-05-26 DIAGNOSIS — R296 Repeated falls: Secondary | ICD-10-CM | POA: Diagnosis not present

## 2015-05-26 DIAGNOSIS — F32A Depression, unspecified: Secondary | ICD-10-CM

## 2015-05-26 DIAGNOSIS — E43 Unspecified severe protein-calorie malnutrition: Secondary | ICD-10-CM | POA: Diagnosis not present

## 2015-05-26 DIAGNOSIS — N179 Acute kidney failure, unspecified: Secondary | ICD-10-CM

## 2015-05-26 DIAGNOSIS — Z950 Presence of cardiac pacemaker: Secondary | ICD-10-CM | POA: Diagnosis not present

## 2015-05-26 DIAGNOSIS — N189 Chronic kidney disease, unspecified: Secondary | ICD-10-CM

## 2015-05-26 DIAGNOSIS — M6281 Muscle weakness (generalized): Secondary | ICD-10-CM | POA: Diagnosis not present

## 2015-05-26 NOTE — Progress Notes (Signed)
Patient ID: Zachary Harris, male   DOB: 1923/03/19, 81 y.o.   MRN: [id number]    HISTORY AND PHYSICAL  Location:  [facility]  Nursing Home Room Number: N24 Place of Service: SNF (31)   Extended Emergency Contact Information Primary Emergency Contact: Tana Conch, [place] Home Phone: [phone] Relation: Daughter Secondary Emergency Contact: Garner [place] Home Phone: [phone] Mobile Phone: [phone] Relation: Son  Advanced Directive information Does patient have an advance directive?: Yes, Type of Advance Directive: Out of facility DNR (pink MOST or yellow form);Healthcare Power of [place];Living will, Pre-existing out of facility DNR order (yellow form or pink MOST form): Pink MOST form placed in chart (order not valid for inpatient use);Yellow form placed in chart (order not valid for inpatient use), Does patient want to make changes to advanced directive?: No - Patient declined  Chief Complaint  Patient presents with   New Admit To SNF    Following hospitalization    HPI:   Admission to SNF 05/23/15. Hospitalized 05/20/2015 through 05/23/2015.  Patient has adult failure to thrive with anorexia and severe weight loss. He also is having some back pain which is quite disabling. He had a fracture of T12 which has progressed. He recently was put on Megestrol and Boost. He also received fentanyl 25 g per hour patch for his back pain.  Patient has not done well since the addition of these medications. He saw Dr. Maurice Small last week for possible injection in the spine. He do not feel that this patient would benefit from that at this time but because of his serious deterioration and continuing pain he recommended hospitalization. He was sent to the emergency room from the office. While hospitalized, he did not make much progress. He had a palliative care consult and patient and family elected no further  aggressive treatments. He was then sent to skilled nursing facility at [facility] for further rehabilitative care.  The focus of attention while here will be on pain control, increasing mobility, and increasing caloric intake.  Multiple medications have been stopped. While in the hospital there was discontinuation of losartan, Xarelto, Fosamax, Lipitor, fenofibrate, furosemide, and metformin. Since coming into the nursing home, at the request of family, he has had additional drugs stopped including Megace, Namenda, and Flomax.  Past Medical History  Diagnosis Date   ANEMIA-IRON DEFICIENCY 05/27/2007   ANXIETY 11/14/2007   Intermittent high-grade heart block 09/05/2009    s/p pacemaker   BENIGN PROSTATIC HYPERTROPHY 11/14/2007   CAROTID ARTERY DISEASE 10/02/2010   Pacemaker-St. Jude 02/02/2008    St. Jude   COLONIC POLYPS, HX OF 05/27/2007   CORONARY ARTERY DISEASE 11/14/2007    prior bypass [phone]; Inferior MI 11/07; Severe 3 vessel disease; occluded SVG to RCA; SVG to OM subtotalled, SVG to diagonal with ostial 70 and 80 mid; EF 45. PCI of SVG to OM. FU myovue showed EF 56 with inferior infarct and very mild peri-infarct ischemia   DEPRESSION 11/14/2007   DIABETES MELLITUS, TYPE II 11/14/2007   DIVERTICULITIS, HX OF 05/27/2007   DVT (deep venous thrombosis) 03/21/2011   FOOT PAIN, BILATERAL 12/08/2010   GERD 11/14/2007   HYPERLIPIDEMIA 11/14/2007   HYPERTENSION 11/14/2007   HYPOTENSION, ORTHOSTATIC 03/06/2011   Memory loss 06/01/2008   OSTEOPOROSIS 06/02/2010   PERIPHERAL VASCULAR DISEASE 11/14/2007   RENAL INSUFFICIENCY 11/14/2007   SECONDARY DM W/PERIPHERAL CIRC D/O UNCONTROLLED 11/07/2010  SYNCOPE 09/08/2008   Unspecified hearing loss 05/13/2009   Prostate cancer    Myalgia and myositis, unspecified 04/24/2011   Other malaise and fatigue 04/24/2011   Peripheral neuropathy 03/27/2011   Seborrheic keratosis 06/05/2011   Osteoarthrosis, unspecified whether  generalized or localized, unspecified site    Ischemic cardiomyopathy     a. echo (10/15):  mod focal basal septal hypertrophy, EF 40-45%, inf AK, Gr 1 DD, Al sclerosis, mild AI, MAC, mild MR, mod LAE, lipomatous hypertrophy, small effusion (no hemodynamic compromise)    Effusion of left knee 03/17/2015   Severe back pain    DM (diabetes mellitus), type 2 with renal complications 01/25/5426   Failure to thrive in adult 05/03/2015   Chronic combined systolic and diastolic CHF (congestive heart failure) 10/10/2014   CKD stage 3 due to type 1 diabetes mellitus 03/17/2015   Dyspnea 11/09/2014   Hearing loss 05/13/2009    Qualifier: Diagnosis of  By: Jenny Reichmann MD, Hunt Oris    Hyperlipemia 11/14/2007    Qualifier: Diagnosis of  By: Jenny Reichmann MD, Hunt Oris     Generalized weakness 05/20/2015   Protein-calorie malnutrition, severe 05/23/2015   Vertebral compression fracture 09/05/2009    Qualifier: Diagnosis of  By: Jenny Reichmann MD, Hunt Oris    Weak 04/04/2015   Weight loss 10/10/2014    Past Surgical History  Procedure Laterality Date   S/p coronary stent x 1     Cholecystectomy     S/p bilat knee replacement  1992    Eddie Dibbles, MD   Pacemaker insertion  2012    Caryl Comes, MD   Prostate needle biopsy     Cystoscopy     Appendectomy     Coronary artery bypass graft  2007    Patient Care Team: Estill Dooms, MD as PCP - General (Internal Medicine) Irine Seal, MD as Consulting Physician (Urology) Renella Cunas, MD as Consulting Physician (Cardiology) [facility] Lelon Perla, MD as Consulting Physician (Cardiology) Man Mast X, NP as Nurse Practitioner (Nurse Practitioner) Rigoberto Noel, MD as Consulting Physician (Pulmonary Disease)  History   Social History   Marital Status: Widowed    Spouse Name: N/A   Number of Children: 2   Years of Education: Copywriter, advertising History   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   Patient has a college education.   Patient is right handed.   Patient drinks 2 cups daily.   Walking with wallker     reports that he quit smoking about 31 years ago. His smoking use included Cigarettes. He has a 50 pack-year smoking history. He has never used smokeless tobacco. He reports that he does not drink alcohol or use illicit drugs.  Family History  Problem Relation Age of Onset   Diabetes Father     58   Cancer Mother    Family Status  Relation Status Death Age   Father Deceased    Mother Deceased    Son Alive    Daughter Alive    Sister Alive  Brother Alive    Brother Alive    Brother Alive    Sister Alive    Sister Alive    Brother Deceased 28    accident    Immunization History  Administered Date(s) Administered   Influenza Whole 09/23/2010, 09/23/2012   Influenza,inj,Quad PF,36+ Mos 10/09/2014   Influenza-Unspecified 09/07/2013, 09/10/2013   Pneumococcal Polysaccharide-23 03/31/2009   Td 03/31/2009    Allergies  Allergen Reactions   Hydrocodone     hallucination   Tramadol     hallucination    Medications: Patient's Medications  New Prescriptions   No medications on file  Previous Medications   ACETAMINOPHEN (TYLENOL) 500 MG TABLET    Take 2 tablets (1,000 mg total) by mouth 2 (two) times daily.   DOCUSATE SODIUM (COLACE) 100 MG CAPSULE    Take 1 capsule (100 mg total) by mouth every 12 (twelve) hours.   FEEDING SUPPLEMENT, GLUCERNA SHAKE, (GLUCERNA SHAKE) LIQD    Take 237 mLs by mouth 2 (two) times daily between meals.   FENTANYL (DURAGESIC) 25 MCG/HR PATCH    Apply fresh patch every 3 days to help pain. Remove old patch.    NITROGLYCERIN (NITROSTAT) 0.4 MG SL TABLET    Place 0.4 mg under the tongue every 5 (five) minutes as needed for chest pain.  Modified Medications   No medications on file  Discontinued Medications   No medications on file    Review of Systems  Constitutional: Negative for fever, chills, activity change, appetite change, fatigue and unexpected weight change.  HENT: Positive for hearing loss. Negative for ear pain and sinus pressure.   Eyes: Negative for photophobia, pain and visual disturbance.       Corrective lenses  Respiratory: Positive for shortness of breath. Negative for cough, chest tightness and wheezing.   Cardiovascular: Negative for leg swelling.       Pacemaker. History of syncope related to cardiac arrhythmia.  Gastrointestinal: Negative for abdominal pain and abdominal distention.  Endocrine: Negative for cold intolerance, heat intolerance, polydipsia, polyphagia and polyuria.       History of diabetes.  Genitourinary: Positive for urgency and frequency. Negative for dysuria and flank pain.       Urge incontinence. Increased frequency which has not responded well to [place]. Continues to see urologist. Now taking oxybutynin 3 times a day. Episodes of incontinence persist.  Musculoskeletal: Positive for myalgias, back pain, arthralgias, gait problem and neck pain (Fall on 03/15/15 causing pain in the left knee and effusion.).       Poor balance and gait disturbance. Lower back pain. Fracture at T12.  Skin:       Rough area on left cheek  Allergic/Immunologic: Negative.   Neurological: Negative for tremors, seizures, facial asymmetry and weakness.       Memory loss  Hematological: Negative.   Psychiatric/Behavioral: Positive for confusion.       Mild to moderate memory disturbance.    Filed Vitals:   05/26/15 1400  BP: 128/68  Pulse: 81  Temp: 97.7 F (36.5 C)  Resp: 18  Height: $Remove'5\' 9"'QeuHnIC$  (1.753 m)  Weight: 167 lb (75.751 kg)  SpO2: 98%   Body mass index is 24.65  kg/(m^2).  Physical Exam  Constitutional: He is oriented to person, place, and time.  Frail, cachectic  HENT:  Head: Normocephalic and atraumatic.  Nose: Nose normal.  Bilateral hearing aids.  Eyes: Conjunctivae are normal. Pupils are equal, round, and reactive to light.  Prescription lenses.  Neck: Normal range  of motion. Neck supple. No JVD present. No tracheal deviation present. No thyromegaly present.  Cardiovascular: Normal rate and regular rhythm.  Exam reveals no gallop and no friction rub.   Murmur heard.  Systolic murmur is present with a grade of 1/6  Absent DP and PT  Pulmonary/Chest: Effort normal and breath sounds normal. No respiratory distress. He has no wheezes. He has no rales. He exhibits no tenderness.  Using portable oxygen at 2 L/m today.  Abdominal: Soft. Bowel sounds are normal. He exhibits no distension and no mass. There is no tenderness.  Musculoskeletal: Normal range of motion. He exhibits edema. He exhibits no tenderness.  Pain in the left costovertebral area.  Lymphadenopathy:    He has no cervical adenopathy.  Neurological: He is alert and oriented to person, place, and time. He has normal strength. A sensory deficit is present. No cranial nerve deficit. Gait abnormal.  07/13/14 MMSE 28/30. Passed clock drawing. Diminshed sensation to vibration and monofilament.  Skin: Skin is warm and dry. No erythema. No pallor.  Rough area on the left cheek. Cystic lesion in the mid forehead.  Psychiatric: He has a normal mood and affect. His behavior is normal. Thought content normal.  Patient is in denial regarding the extent of his memory deficit.     Labs reviewed: Admission on 05/20/2015, Discharged on 05/23/2015  Component Date Value Ref Range Status   WBC 05/20/2015 8.3  4.0 - 10.5 K/uL Final   RBC 05/20/2015 4.29  4.22 - 5.81 MIL/uL Final   Hemoglobin 05/20/2015 13.1  13.0 - 17.0 g/dL Final   HCT 05/20/2015 39.0  39.0 - 52.0 % Final   MCV 05/20/2015  90.9  78.0 - 100.0 fL Final   MCH 05/20/2015 30.5  26.0 - 34.0 pg Final   MCHC 05/20/2015 33.6  30.0 - 36.0 g/dL Final   RDW 05/20/2015 14.2  11.5 - 15.5 % Final   Platelets 05/20/2015 182  150 - 400 K/uL Final   Neutrophils Relative % 05/20/2015 72  43 - 77 % Final   Neutro Abs 05/20/2015 5.9  1.7 - 7.7 K/uL Final   Lymphocytes Relative 05/20/2015 17  12 - 46 % Final   Lymphs Abs 05/20/2015 1.4  0.7 - 4.0 K/uL Final   Monocytes Relative 05/20/2015 8  3 - 12 % Final   Monocytes Absolute 05/20/2015 0.7  0.1 - 1.0 K/uL Final   Eosinophils Relative 05/20/2015 3  0 - 5 % Final   Eosinophils Absolute 05/20/2015 0.3  0.0 - 0.7 K/uL Final   Basophils Relative 05/20/2015 0  0 - 1 % Final   Basophils Absolute 05/20/2015 0.0  0.0 - 0.1 K/uL Final   Sodium 05/20/2015 137  135 - 145 mmol/L Final   Potassium 05/20/2015 3.8  3.5 - 5.1 mmol/L Final   Chloride 05/20/2015 104  101 - 111 mmol/L Final   CO2 05/20/2015 23  22 - 32 mmol/L Final   Glucose, Bld 05/20/2015 149* 65 - 99 mg/dL Final   BUN 05/20/2015 64* 6 - 20 mg/dL Final   Creatinine, Ser 05/20/2015 1.67* 0.61 - 1.24 mg/dL Final   Calcium 05/20/2015 9.5  8.9 - 10.3 mg/dL Final   Total Protein 05/20/2015 6.8  6.5 - 8.1 g/dL Final   Albumin 05/20/2015 3.1* 3.5 - 5.0 g/dL Final   AST 05/20/2015 35  15 - 41 U/L Final   ALT 05/20/2015 19  17 - 63 U/L Final   Alkaline Phosphatase 05/20/2015 122  38 -  126 U/L Final   Total Bilirubin 05/20/2015 0.9  0.3 - 1.2 mg/dL Final   GFR calc non Af Amer 05/20/2015 35* >60 mL/min Final   GFR calc Af Amer 05/20/2015 40* >60 mL/min Final   Comment: (NOTE) The eGFR has been calculated using the CKD EPI equation. This calculation has not been validated in all clinical situations. eGFR's persistently <60 mL/min signify possible Chronic Kidney Disease.    Anion gap 05/20/2015 10  5 - 15 Final   Troponin i, poc 05/20/2015 0.05  0.00 - 0.08 ng/mL Final   Comment 3 05/20/2015           Final   Comment: Due to the release kinetics of cTnI, a negative result within the first hours of the onset of symptoms does not rule out myocardial infarction with certainty. If myocardial infarction is still suspected, repeat the test at appropriate intervals.    Color, Urine 05/20/2015 AMBER* YELLOW Final   BIOCHEMICALS MAY BE AFFECTED BY COLOR   APPearance 05/20/2015 CLOUDY* CLEAR Final   Specific Gravity, Urine 05/20/2015 1.022  1.005 - 1.030 Final   pH 05/20/2015 5.0  5.0 - 8.0 Final   Glucose, UA 05/20/2015 NEGATIVE  NEGATIVE mg/dL Final   Hgb urine dipstick 05/20/2015 NEGATIVE  NEGATIVE Final   Bilirubin Urine 05/20/2015 NEGATIVE  NEGATIVE Final   Ketones, ur 05/20/2015 NEGATIVE  NEGATIVE mg/dL Final   Protein, ur 05/20/2015 NEGATIVE  NEGATIVE mg/dL Final   Urobilinogen, UA 05/20/2015 0.2  0.0 - 1.0 mg/dL Final   Nitrite 05/20/2015 NEGATIVE  NEGATIVE Final   Leukocytes, UA 05/20/2015 NEGATIVE  NEGATIVE Final   MICROSCOPIC NOT DONE ON URINES WITH NEGATIVE PROTEIN, BLOOD, LEUKOCYTES, NITRITE, OR GLUCOSE <1000 mg/dL.   TSH 05/20/2015 1.574  0.350 - 4.500 uIU/mL Final   Sodium 05/21/2015 136  135 - 145 mmol/L Final   Potassium 05/21/2015 3.4* 3.5 - 5.1 mmol/L Final   Chloride 05/21/2015 104  101 - 111 mmol/L Final   CO2 05/21/2015 25  22 - 32 mmol/L Final   Glucose, Bld 05/21/2015 137* 65 - 99 mg/dL Final   BUN 05/21/2015 57* 6 - 20 mg/dL Final   Creatinine, Ser 05/21/2015 1.47* 0.61 - 1.24 mg/dL Final   Calcium 05/21/2015 8.8* 8.9 - 10.3 mg/dL Final   GFR calc non Af Amer 05/21/2015 40* >60 mL/min Final   GFR calc Af Amer 05/21/2015 47* >60 mL/min Final   Comment: (NOTE) The eGFR has been calculated using the CKD EPI equation. This calculation has not been validated in all clinical situations. eGFR's persistently <60 mL/min signify possible Chronic Kidney Disease.    Anion gap 05/21/2015 7  5 - 15 Final   WBC 05/21/2015 6.3  4.0 - 10.5 K/uL  Final   RBC 05/21/2015 3.84* 4.22 - 5.81 MIL/uL Final   Hemoglobin 05/21/2015 11.5* 13.0 - 17.0 g/dL Final   HCT 05/21/2015 34.6* 39.0 - 52.0 % Final   MCV 05/21/2015 90.1  78.0 - 100.0 fL Final   MCH 05/21/2015 29.9  26.0 - 34.0 pg Final   MCHC 05/21/2015 33.2  30.0 - 36.0 g/dL Final   RDW 05/21/2015 14.2  11.5 - 15.5 % Final   Platelets 05/21/2015 147* 150 - 400 K/uL Final   Glucose-Capillary 05/20/2015 111* 65 - 99 mg/dL Final   Comment 1 05/20/2015 Notify RN   Final   Comment 2 05/20/2015 Document in Chart   Final   Glucose-Capillary 05/21/2015 102* 65 - 99 mg/dL Final   Comment 1  05/21/2015 Notify RN   Final   Comment 2 05/21/2015 Document in Chart   Final   Glucose-Capillary 05/21/2015 146* 65 - 99 mg/dL Final   Comment 1 05/21/2015 Notify RN   Final   Comment 2 05/21/2015 Document in Chart   Final   Sodium 05/22/2015 135  135 - 145 mmol/L Final   Potassium 05/22/2015 4.4  3.5 - 5.1 mmol/L Final   DELTA CHECK NOTED   Chloride 05/22/2015 107  101 - 111 mmol/L Final   CO2 05/22/2015 21* 22 - 32 mmol/L Final   Glucose, Bld 05/22/2015 126* 65 - 99 mg/dL Final   BUN 05/22/2015 33* 6 - 20 mg/dL Final   Creatinine, Ser 05/22/2015 0.92  0.61 - 1.24 mg/dL Final   Calcium 05/22/2015 8.6* 8.9 - 10.3 mg/dL Final   GFR calc non Af Amer 05/22/2015 >60  >60 mL/min Final   GFR calc Af Amer 05/22/2015 >60  >60 mL/min Final   Comment: (NOTE) The eGFR has been calculated using the CKD EPI equation. This calculation has not been validated in all clinical situations. eGFR's persistently <60 mL/min signify possible Chronic Kidney Disease.    Anion gap 05/22/2015 7  5 - 15 Final   WBC 05/22/2015 5.4  4.0 - 10.5 K/uL Final   RBC 05/22/2015 3.92* 4.22 - 5.81 MIL/uL Final   Hemoglobin 05/22/2015 11.9* 13.0 - 17.0 g/dL Final   HCT 05/22/2015 35.3* 39.0 - 52.0 % Final   MCV 05/22/2015 90.1  78.0 - 100.0 fL Final   MCH 05/22/2015 30.4  26.0 - 34.0 pg Final   MCHC  05/22/2015 33.7  30.0 - 36.0 g/dL Final   RDW 05/22/2015 14.2  11.5 - 15.5 % Final   Platelets 05/22/2015 126* 150 - 400 K/uL Final   Glucose-Capillary 05/21/2015 123* 65 - 99 mg/dL Final   Comment 1 05/21/2015 Notify RN   Final   Comment 2 05/21/2015 Document in Chart   Final   Glucose-Capillary 05/21/2015 112* 65 - 99 mg/dL Final   Comment 1 05/21/2015 Notify RN   Final   Comment 2 05/21/2015 Document in Chart   Final   Glucose-Capillary 05/22/2015 119* 65 - 99 mg/dL Final   Glucose-Capillary 05/22/2015 126* 65 - 99 mg/dL Final   Glucose-Capillary 05/22/2015 120* 65 - 99 mg/dL Final   Glucose-Capillary 05/22/2015 124* 65 - 99 mg/dL Final   Glucose-Capillary 05/22/2015 165* 65 - 99 mg/dL Final   Comment 1 05/22/2015 Notify RN   Final   Comment 2 05/22/2015 Document in Chart   Final   Glucose-Capillary 05/23/2015 109* 65 - 99 mg/dL Final   Comment 1 05/23/2015 Notify RN   Final   Comment 2 05/23/2015 Document in Chart   Final   Glucose-Capillary 05/23/2015 232* 65 - 99 mg/dL Final   Comment 1 05/23/2015 Notify RN   Final   Comment 2 05/23/2015 Document in Chart   Final  Nursing Home on 05/17/2015  Component Date Value Ref Range Status   Glucose 05/12/2015 144   Final   BUN 05/12/2015 57* 4 - 21 mg/dL Final   Creatinine 05/12/2015 1.3  0.6 - 1.3 mg/dL Final   Potassium 05/12/2015 3.9  3.4 - 5.3 mmol/L Final   Sodium 05/12/2015 136* 137 - 147 mmol/L Final   Alkaline Phosphatase 05/12/2015 86  25 - 125 U/L Final   ALT 05/12/2015 13  10 - 40 U/L Final   AST 05/12/2015 24  14 - 40 U/L Final   Bilirubin, Total  05/12/2015 0.8   Final  Nursing Home on 05/03/2015  Component Date Value Ref Range Status   WBC 05/03/2015 9.6  3.4 - 10.8 x10E3/uL Final   RBC 05/03/2015 4.53  4.14 - 5.80 x10E6/uL Final   Hemoglobin 05/03/2015 13.9  12.6 - 17.7 g/dL Final   Hematocrit 05/03/2015 42.1  37.5 - 51.0 % Final   MCV 05/03/2015 93  79 - 97 fL Final   MCH  05/03/2015 30.7  26.6 - 33.0 pg Final   MCHC 05/03/2015 33.0  31.5 - 35.7 g/dL Final   RDW 05/03/2015 14.2  12.3 - 15.4 % Final   NEUTROPHILS 05/03/2015 71   Final   Lymphs 05/03/2015 19   Final   Monocytes 05/03/2015 7   Final   Eos 05/03/2015 3   Final   Basos 05/03/2015 0   Final   Neutrophils Absolute 05/03/2015 6.8  1.4 - 7.0 x10E3/uL Final   Lymphocytes Absolute 05/03/2015 1.8  0.7 - 3.1 x10E3/uL Final   Monocytes Absolute 05/03/2015 0.7  0.1 - 0.9 x10E3/uL Final   EOS (ABSOLUTE) 05/03/2015 0.3  0.0 - 0.4 x10E3/uL Final   Basophils Absolute 05/03/2015 0.0  0.0 - 0.2 x10E3/uL Final   Immature Granulocytes 05/03/2015 0   Final   Immature Grans (Abs) 05/03/2015 0.0  0.0 - 0.1 x10E3/uL Final   Glucose 05/03/2015 205* 65 - 99 mg/dL Final   BUN 05/03/2015 51* 8 - 27 mg/dL Final   Creatinine, Ser 05/03/2015 1.39* 0.76 - 1.27 mg/dL Final   GFR calc non Af Amer 05/03/2015 45* >59 mL/min/1.73 Final   GFR calc Af Amer 05/03/2015 52* >59 mL/min/1.73 Final   BUN/Creatinine Ratio 05/03/2015 37* 10 - 22 Final   Sodium 05/03/2015 141  134 - 144 mmol/L Final   Potassium 05/03/2015 4.1  3.5 - 5.2 mmol/L Final   Chloride 05/03/2015 98  97 - 108 mmol/L Final   CO2 05/03/2015 26  18 - 29 mmol/L Final   Calcium 05/03/2015 10.0  8.6 - 10.2 mg/dL Final   Total Protein 05/03/2015 7.1  6.0 - 8.5 g/dL Final   Albumin 05/03/2015 3.9  3.5 - 4.7 g/dL Final   Globulin, Total 05/03/2015 3.2  1.5 - 4.5 g/dL Final   Albumin/Globulin Ratio 05/03/2015 1.2  1.1 - 2.5 Final   Bilirubin Total 05/03/2015 0.8  0.0 - 1.2 mg/dL Final   Alkaline Phosphatase 05/03/2015 66  39 - 117 IU/L Final   AST 05/03/2015 21  0 - 40 IU/L Final   ALT 05/03/2015 11  0 - 44 IU/L Final   Specific Gravity, UA 05/03/2015 1.025  1.005 - 1.030 Final   pH, UA 05/03/2015 5.5  5.0 - 7.5 Final   Color, UA 05/03/2015 Yellow  Yellow Final   Appearance Ur 05/03/2015 Clear  Clear Final   Leukocytes, UA  05/03/2015 Negative  Negative Final   Protein, UA 05/03/2015 Negative  Negative/Trace Final   Glucose, UA 05/03/2015 Negative  Negative Final   Ketones, UA 05/03/2015 Negative  Negative Final   RBC, UA 05/03/2015 Negative  Negative Final   Bilirubin, UA 05/03/2015 Negative  Negative Final   Urobilinogen, Ur 05/03/2015 1.0  0.2 - 1.0 mg/dL Final   Nitrite, UA 05/03/2015 Negative  Negative Final  Clinical Support on 04/19/2015  Component Date Value Ref Range Status   Date Time Interrogation Session 04/19/2015 [id number]   Final   Implantable Pulse Generator Manufa* 04/19/2015 St. Jude Medical   Final   Implantable Pulse Generator Model 04/19/2015 2210  [employer] DR RF   Final   Implantable Pulse Generator Serial* 04/19/2015 [id number]   Final   Lead Channel Setting Sensing Sensi* 04/19/2015 2   Final   Lead Channel Setting Sensing Adapt* 04/19/2015 Fixed Pacing   Final   Lead Channel Setting Pacing Amplit* 04/19/2015 2   Final   Lead Channel Setting Pacing Pulse * 04/19/2015 0.4   Final   Lead Channel Setting Pacing Amplit* 04/19/2015 2.5   Final   Lead Channel Status 04/19/2015    Final   Lead Channel Impedance Value 04/19/2015 400   Final   Lead Channel Sensing Intrinsic Amp* 04/19/2015 2.6   Final   Lead Channel Pacing Threshold Ampl* 04/19/2015 0.75   Final   Lead Channel Pacing Threshold Puls* 04/19/2015 0.4   Final   Lead Channel Status 04/19/2015    Final   Lead Channel Impedance Value 04/19/2015 530   Final   Lead Channel Sensing Intrinsic Amp* 04/19/2015 12   Final   Lead Channel Pacing Threshold Ampl* 04/19/2015 0.75   Final   Lead Channel Pacing Threshold Puls* 04/19/2015 0.4   Final   Battery Status 04/19/2015 MOS   Final   Battery Remaining Longevity 04/19/2015 106   Final   Battery Remaining Percentage 04/19/2015 82   Final   Battery Voltage 04/19/2015 2.96   Final   Brady Statistic RA Percent Paced 04/19/2015 3   Final   Brady  Statistic RV Percent Paced 04/19/2015 33   Final   Brady Statistic AP VP Percent 04/19/2015 1.4   Final   Brady Statistic AS VP Percent 04/19/2015 31   Final   Brady Statistic AP VS Percent 04/19/2015 2.3   Final   Brady Statistic AS VS Percent 04/19/2015 64   Final   Eval Rhythm 04/19/2015 AsVs   Final   Miscellaneous Comment 04/19/2015    Final                   Value:Pacemaker remote check. Device function reviewed. Impedance, sensing, auto capture thresholds consistent with previous measurements. Histograms appropriate for patient and level of activity. All other diagnostic data reviewed and is appropriate and  stable for patient. Real time EGM shows appropriate sensing. 12 mode switches (<1% +xarelto)- longest 2:18:16, pk A 415, pk v 111. No ventricular high rate pisodes. Estimated longevity 8.4-9.3 years. Merlin 07/19/2015, ROV with SK in October.   Admission on 03/15/2015, Discharged on 03/15/2015  Component Date Value Ref Range Status   WBC 03/15/2015 7.5  4.0 - 10.5 K/uL Final   RBC 03/15/2015 4.25  4.22 - 5.81 MIL/uL Final   Hemoglobin 03/15/2015 13.0  13.0 - 17.0 g/dL Final   HCT 03/15/2015 39.9  39.0 - 52.0 % Final   MCV 03/15/2015 93.9  78.0 - 100.0 fL Final   MCH 03/15/2015 30.6  26.0 - 34.0 pg Final   MCHC 03/15/2015 32.6  30.0 - 36.0 g/dL Final   RDW 03/15/2015 13.5  11.5 - 15.5 % Final   Platelets 03/15/2015 142* 150 - 400 K/uL Final   Sodium 03/15/2015 140  135 - 145 mmol/L Final   Potassium 03/15/2015 4.3  3.5 - 5.1 mmol/L Final   Chloride 03/15/2015 106  96 - 112 mmol/L Final   CO2 03/15/2015 26  19 - 32 mmol/L Final   Glucose, Bld 03/15/2015 132* 70 - 99 mg/dL Final   BUN 03/15/2015 37* 6 - 23 mg/dL Final   Creatinine, Ser 03/15/2015 1.30  0.50 - 1.35 mg/dL  Final   Calcium 03/15/2015 9.3  8.4 - 10.5 mg/dL Final   GFR calc non Af Amer 03/15/2015 47* >90 mL/min Final   GFR calc Af Amer 03/15/2015 54* >90 mL/min Final   Comment: (NOTE) The  eGFR has been calculated using the CKD EPI equation. This calculation has not been validated in all clinical situations. eGFR's persistently <90 mL/min signify possible Chronic Kidney Disease.    Anion gap 03/15/2015 8  5 - 15 Final   B Natriuretic Peptide 03/15/2015 213.8* 0.0 - 100.0 pg/mL Final   Troponin I 03/15/2015 <0.03  <0.031 ng/mL Final   Comment:        NO INDICATION OF MYOCARDIAL INJURY.     Dg Chest 2 View  05/20/2015   CLINICAL DATA:  Pt presents to department for evaluation of failure to thrive. Daughter reports frequent falls, decreased appetite, weight loss, chronic back pain and confusion. Pt is alert upon arrival to ED. History of CHF, pacemaker, and MI. Wears 2L O2. Currently living in independent living. Pt has hx of HTN, diabetes, GERD. Non-Smoker.  EXAM: CHEST  2 VIEW  COMPARISON:  10/08/2014  FINDINGS: Stable changes from previous CABG surgery. Cardiac silhouette is mildly enlarged. Aorta is uncoiled. No mediastinal or hilar masses or evidence of adenopathy.  Left anterior chest wall sequential pacemaker is stable and well positioned.  Mild bilateral interstitial thickening. No lung consolidation. No convincing pulmonary edema. No pleural effusion or pneumothorax.  Bony thorax is demineralized.  IMPRESSION: No acute cardiopulmonary disease.   Electronically Signed   By: Lajean Manes M.D.   On: 05/20/2015 15:22     Assessment/Plan Patient has been admitted to skilled nursing facility in the hopes that adequate safe mobility can be reestablished and that nutritional intake will improve. It is currently unclear whether these goals can be achieved and he can be moved to a lower level of care.  1. AKI (acute kidney injury) Resolved with IV fluids in the hospital. Patient is back to baseline in regards to BUN and creatinine  2. Failure to thrive in adult Although drinking boost, his other intake is inadequate.  3. Weak PT and OT will be engaged to try to improve  strength and self-care skills.  4. Weight loss Poor oral intake  5. Protein-calorie malnutrition, severe 4 oral intake  6. Memory loss Patient is now off Namenda  7. Essential hypertension Controlled and off medications at present  8. Depression Patient is appropriately responsive and I do not see indications of depression at this time  9. CKD stage 3 due to type 1 diabetes mellitus Chronic renal disease  10. Chronic combined systolic and diastolic CHF (congestive heart failure) Chronic with low LV EF  11. Midline low back pain with sciatica, sciatica laterality unspecified Continue present with no patch. Consider increase if necessary.  12. Type 2 diabetes mellitus with diabetic chronic kidney disease Continue to monitor

## 2015-05-27 ENCOUNTER — Encounter: Payer: Self-pay | Admitting: Nurse Practitioner

## 2015-05-27 DIAGNOSIS — K5900 Constipation, unspecified: Secondary | ICD-10-CM | POA: Insufficient documentation

## 2015-05-27 DIAGNOSIS — Z96653 Presence of artificial knee joint, bilateral: Secondary | ICD-10-CM | POA: Diagnosis not present

## 2015-05-27 DIAGNOSIS — R55 Syncope and collapse: Secondary | ICD-10-CM | POA: Diagnosis not present

## 2015-05-27 DIAGNOSIS — R296 Repeated falls: Secondary | ICD-10-CM | POA: Diagnosis not present

## 2015-05-27 DIAGNOSIS — R262 Difficulty in walking, not elsewhere classified: Secondary | ICD-10-CM | POA: Diagnosis not present

## 2015-05-27 DIAGNOSIS — Z950 Presence of cardiac pacemaker: Secondary | ICD-10-CM | POA: Diagnosis not present

## 2015-05-27 DIAGNOSIS — M6281 Muscle weakness (generalized): Secondary | ICD-10-CM | POA: Diagnosis not present

## 2015-05-27 DIAGNOSIS — R413 Other amnesia: Secondary | ICD-10-CM | POA: Diagnosis not present

## 2015-05-27 DIAGNOSIS — M25462 Effusion, left knee: Secondary | ICD-10-CM | POA: Diagnosis not present

## 2015-05-30 DIAGNOSIS — M6281 Muscle weakness (generalized): Secondary | ICD-10-CM | POA: Diagnosis not present

## 2015-05-30 DIAGNOSIS — M25462 Effusion, left knee: Secondary | ICD-10-CM | POA: Diagnosis not present

## 2015-05-30 DIAGNOSIS — R55 Syncope and collapse: Secondary | ICD-10-CM | POA: Diagnosis not present

## 2015-05-30 DIAGNOSIS — R296 Repeated falls: Secondary | ICD-10-CM | POA: Diagnosis not present

## 2015-05-30 DIAGNOSIS — Z950 Presence of cardiac pacemaker: Secondary | ICD-10-CM | POA: Diagnosis not present

## 2015-05-30 DIAGNOSIS — R413 Other amnesia: Secondary | ICD-10-CM | POA: Diagnosis not present

## 2015-05-30 DIAGNOSIS — R262 Difficulty in walking, not elsewhere classified: Secondary | ICD-10-CM | POA: Diagnosis not present

## 2015-05-30 DIAGNOSIS — Z96653 Presence of artificial knee joint, bilateral: Secondary | ICD-10-CM | POA: Diagnosis not present

## 2015-05-31 ENCOUNTER — Encounter: Payer: Self-pay | Admitting: Internal Medicine

## 2015-06-01 DIAGNOSIS — R262 Difficulty in walking, not elsewhere classified: Secondary | ICD-10-CM | POA: Diagnosis not present

## 2015-06-01 DIAGNOSIS — Z96653 Presence of artificial knee joint, bilateral: Secondary | ICD-10-CM | POA: Diagnosis not present

## 2015-06-01 DIAGNOSIS — R55 Syncope and collapse: Secondary | ICD-10-CM | POA: Diagnosis not present

## 2015-06-01 DIAGNOSIS — R296 Repeated falls: Secondary | ICD-10-CM | POA: Diagnosis not present

## 2015-06-01 DIAGNOSIS — Z950 Presence of cardiac pacemaker: Secondary | ICD-10-CM | POA: Diagnosis not present

## 2015-06-01 DIAGNOSIS — R413 Other amnesia: Secondary | ICD-10-CM | POA: Diagnosis not present

## 2015-06-01 DIAGNOSIS — M6281 Muscle weakness (generalized): Secondary | ICD-10-CM | POA: Diagnosis not present

## 2015-06-01 DIAGNOSIS — M25462 Effusion, left knee: Secondary | ICD-10-CM | POA: Diagnosis not present

## 2015-06-02 ENCOUNTER — Ambulatory Visit: Payer: Medicare Other | Admitting: Pulmonary Disease

## 2015-06-03 ENCOUNTER — Encounter: Payer: Self-pay | Admitting: Nurse Practitioner

## 2015-06-03 ENCOUNTER — Non-Acute Institutional Stay (SKILLED_NURSING_FACILITY): Payer: Medicare Other | Admitting: Nurse Practitioner

## 2015-06-03 DIAGNOSIS — R627 Adult failure to thrive: Secondary | ICD-10-CM | POA: Diagnosis not present

## 2015-06-03 DIAGNOSIS — M6281 Muscle weakness (generalized): Secondary | ICD-10-CM | POA: Diagnosis not present

## 2015-06-03 DIAGNOSIS — G629 Polyneuropathy, unspecified: Secondary | ICD-10-CM

## 2015-06-03 DIAGNOSIS — M4850XS Collapsed vertebra, not elsewhere classified, site unspecified, sequela of fracture: Secondary | ICD-10-CM

## 2015-06-03 DIAGNOSIS — M5440 Lumbago with sciatica, unspecified side: Secondary | ICD-10-CM

## 2015-06-03 DIAGNOSIS — I2510 Atherosclerotic heart disease of native coronary artery without angina pectoris: Secondary | ICD-10-CM

## 2015-06-03 DIAGNOSIS — I480 Paroxysmal atrial fibrillation: Secondary | ICD-10-CM

## 2015-06-03 DIAGNOSIS — R55 Syncope and collapse: Secondary | ICD-10-CM | POA: Diagnosis not present

## 2015-06-03 DIAGNOSIS — I10 Essential (primary) hypertension: Secondary | ICD-10-CM

## 2015-06-03 DIAGNOSIS — E1142 Type 2 diabetes mellitus with diabetic polyneuropathy: Secondary | ICD-10-CM | POA: Diagnosis not present

## 2015-06-03 DIAGNOSIS — R296 Repeated falls: Secondary | ICD-10-CM | POA: Diagnosis not present

## 2015-06-03 DIAGNOSIS — Z950 Presence of cardiac pacemaker: Secondary | ICD-10-CM | POA: Diagnosis not present

## 2015-06-03 DIAGNOSIS — Z96653 Presence of artificial knee joint, bilateral: Secondary | ICD-10-CM | POA: Diagnosis not present

## 2015-06-03 DIAGNOSIS — IMO0001 Reserved for inherently not codable concepts without codable children: Secondary | ICD-10-CM

## 2015-06-03 DIAGNOSIS — M25462 Effusion, left knee: Secondary | ICD-10-CM | POA: Diagnosis not present

## 2015-06-03 DIAGNOSIS — I5042 Chronic combined systolic (congestive) and diastolic (congestive) heart failure: Secondary | ICD-10-CM

## 2015-06-03 DIAGNOSIS — R262 Difficulty in walking, not elsewhere classified: Secondary | ICD-10-CM | POA: Diagnosis not present

## 2015-06-03 DIAGNOSIS — R413 Other amnesia: Secondary | ICD-10-CM

## 2015-06-03 DIAGNOSIS — K5900 Constipation, unspecified: Secondary | ICD-10-CM | POA: Diagnosis not present

## 2015-06-03 NOTE — Assessment & Plan Note (Signed)
O2 dependent, DOE apparent, activity as tolerated, diurese as needed to ensure comfort care.

## 2015-06-03 NOTE — Assessment & Plan Note (Signed)
Rate is in control w/o medications.

## 2015-06-03 NOTE — Assessment & Plan Note (Signed)
Etiology of the back pain. Continue Fentanyl for pain control. Activity as tolerated.

## 2015-06-03 NOTE — Assessment & Plan Note (Signed)
Progressing. off Namenda R>B. SNF for care needs. Comfort measures.

## 2015-06-03 NOTE — Assessment & Plan Note (Signed)
06/03/15 Lactulose 42ml qod

## 2015-06-03 NOTE — Assessment & Plan Note (Signed)
Comfort measures

## 2015-06-03 NOTE — Assessment & Plan Note (Signed)
No angina since admitted to SNF. Prn NTG, statin, and off Xarelto

## 2015-06-03 NOTE — Assessment & Plan Note (Signed)
Continue Fentanyl patch. Activity as tolerated. Desires no further tx.

## 2015-06-03 NOTE — Assessment & Plan Note (Signed)
Blood pressure is controlled. off Losartan 25mg 

## 2015-06-03 NOTE — Assessment & Plan Note (Signed)
Lost about #20Ibs recently. ADL dependency. Off Megace, Namenda, and Flomax-desires no diagnostic testing, consultations, aggressive tx. Plan of care is comfort measures. May have PT to eval and tx as indicated  06/03/15-improved overall, POA desires labs CBC, CMP, UA C/S, TSH

## 2015-06-03 NOTE — Progress Notes (Signed)
Patient ID: Zachary Harris, male   DOB: Mar 28, 1923, 81 y.o.   MRN: [id number]   Code Status: DNR comfort measures.   Allergies  Allergen Reactions  . Hydrocodone     hallucination  . Tramadol     hallucination    Chief Complaint  Patient presents with  . Medical Management of Chronic Issues    HPI: Patient is a 81 y.o. male seen in the SNF at [facility] today for evaluation of chronic medical conditions.     Hospitalized 05/20/2015-05/23/2015 for back pain(Vertebral compression fx) and FTT. The patient was transitioned to comfort care. Palliative care consulted and family members wished to focus his care to comfort, avoiding any further potential burdensome or invasive interventions.   Palliative care        ED eval 03/15/15 bilateral knee pain after fall. X-ray show effusion of the left knee. No fractures or dislocations, arthroplasty is intact bilaterally. As he is on xarelto, he is not a good candidate for arthrocentesis to drain effusion.  Problem List Items Addressed This Visit    Essential hypertension - Primary (Chronic)    Blood pressure is controlled. off Losartan 25mg          CAD (coronary artery disease) (Chronic)    No angina since admitted to SNF. Prn NTG, statin, and off Xarelto      Backache (Chronic)    Continue Fentanyl patch. Activity as tolerated. Desires no further tx.        Memory loss (Chronic)    Progressing. off Namenda R>B. SNF for care needs. Comfort measures.        DM type 2 with diabetic peripheral neuropathy (Chronic)    Comfort measures.       Chronic combined systolic and diastolic CHF (congestive heart failure) (Chronic)    O2 dependent, DOE apparent, activity as tolerated, diurese as needed to ensure comfort care.        Failure to thrive in adult (Chronic)    Lost about #20Ibs recently. ADL dependency. Off Megace, Namenda, and Flomax-desires no diagnostic testing, consultations, aggressive tx. Plan of care is  comfort measures. May have PT to eval and tx as indicated  06/03/15-improved overall, POA desires labs CBC, CMP, UA C/S, TSH       Vertebral compression fracture    Etiology of the back pain. Continue Fentanyl for pain control. Activity as tolerated.        Paroxysmal atrial fibrillation    Rate is in control w/o medications.        Constipation    06/03/15 Lactulose 20ml qod           Review of Systems:  Review of Systems  Constitutional: Negative for fever, chills, activity change, appetite change, fatigue and unexpected weight change.  HENT: Positive for hearing loss. Negative for ear pain and sinus pressure.   Eyes: Negative for photophobia, pain and visual disturbance.       Corrective lenses  Respiratory: Positive for shortness of breath. Negative for cough, chest tightness and wheezing.   Cardiovascular: Negative for leg swelling.       Pacemaker. History of syncope related to cardiac arrhythmia.  Gastrointestinal: Negative for abdominal pain and abdominal distention.  Endocrine: Negative for cold intolerance, heat intolerance, polydipsia, polyphagia and polyuria.       History of diabetes.  Genitourinary: Positive for urgency and frequency. Negative for dysuria and flank pain.       Urge incontinence. Increased frequency which has not responded  well to [place]. Continues to see urologist. Now taking oxybutynin 3 times a day. Episodes of incontinence persist.  Musculoskeletal: Positive for myalgias, back pain, arthralgias, gait problem and neck pain (Fall on 03/15/15 causing pain in the left knee and effusion.).       Poor balance and gait disturbance. Lower back pain  Skin:       Rough area on left cheek  Allergic/Immunologic: Negative.   Neurological: Negative for tremors, seizures, facial asymmetry and weakness.       Memory loss  Hematological: Negative.   Psychiatric/Behavioral: Positive for confusion.       Mild to moderate memory disturbance.     Past  Medical History  Diagnosis Date  . ANEMIA-IRON DEFICIENCY 05/27/2007  . ANXIETY 11/14/2007  . Intermittent high-grade heart block 09/05/2009    s/p pacemaker  . BENIGN PROSTATIC HYPERTROPHY 11/14/2007  . CAROTID ARTERY DISEASE 10/02/2010  . Pacemaker-St. Jude 02/02/2008    St. Jude  . COLONIC POLYPS, HX OF 05/27/2007  . CORONARY ARTERY DISEASE 11/14/2007    prior bypass [phone]; Inferior MI 11/07; Severe 3 vessel disease; occluded SVG to RCA; SVG to OM subtotalled, SVG to diagonal with ostial 70 and 80 mid; EF 45. PCI of SVG to OM. FU myovue showed EF 56 with inferior infarct and very mild peri-infarct ischemia  . DEPRESSION 11/14/2007  . DIABETES MELLITUS, TYPE II 11/14/2007  . DIVERTICULITIS, HX OF 05/27/2007  . DVT (deep venous thrombosis) 03/21/2011  . FOOT PAIN, BILATERAL 12/08/2010  . GERD 11/14/2007  . HYPERLIPIDEMIA 11/14/2007  . HYPERTENSION 11/14/2007  . HYPOTENSION, ORTHOSTATIC 03/06/2011  . Memory loss 06/01/2008  . OSTEOPOROSIS 06/02/2010  . PERIPHERAL VASCULAR DISEASE 11/14/2007  . RENAL INSUFFICIENCY 11/14/2007  . SECONDARY DM W/PERIPHERAL CIRC D/O UNCONTROLLED 11/07/2010  . SYNCOPE 09/08/2008  . Unspecified hearing loss 05/13/2009  . Prostate cancer   . Myalgia and myositis, unspecified 04/24/2011  . Other malaise and fatigue 04/24/2011  . Peripheral neuropathy 03/27/2011  . Seborrheic keratosis 06/05/2011  . Osteoarthrosis, unspecified whether generalized or localized, unspecified site   . Ischemic cardiomyopathy     a. echo (10/15):  mod focal basal septal hypertrophy, EF 40-45%, inf AK, Gr 1 DD, Al sclerosis, mild AI, MAC, mild MR, mod LAE, lipomatous hypertrophy, small effusion (no hemodynamic compromise)   . Effusion of left knee 03/17/2015  . Severe back pain   . DM (diabetes mellitus), type 2 with renal complications 2/87/8676  . Failure to thrive in adult 05/03/2015  . Chronic combined systolic and diastolic CHF (congestive heart failure) 10/10/2014  . CKD stage 3 due to  type 1 diabetes mellitus 03/17/2015  . Dyspnea 11/09/2014  . Hearing loss 05/13/2009    Qualifier: Diagnosis of  By: Jenny Reichmann MD, Hunt Oris   . Hyperlipemia 11/14/2007    Qualifier: Diagnosis of  By: Jenny Reichmann MD, Hunt Oris    . Generalized weakness 05/20/2015  . Protein-calorie malnutrition, severe 05/23/2015  . Vertebral compression fracture 09/05/2009    Qualifier: Diagnosis of  By: Jenny Reichmann MD, Hunt Oris   . [name] 04/04/2015  . Weight loss 10/10/2014   Past Surgical History  Procedure Laterality Date  . S/p coronary stent x 1    . Cholecystectomy    . S/p bilat knee replacement  Cleon Gustin, MD  . Pacemaker insertion  2012    Caryl Comes, MD  . Prostate needle biopsy    . Cystoscopy    . Appendectomy    . Coronary artery bypass  graft  2007   Social History:   reports that he quit smoking about 31 years ago. His smoking use included Cigarettes. He has a 50 pack-year smoking history. He has never used smokeless tobacco. He reports that he does not drink alcohol or use illicit drugs.  Family History  Problem Relation Age of Onset  . Diabetes Father     47  . Cancer Mother     Medications: Patient's Medications  New Prescriptions   No medications on file  Previous Medications   ACETAMINOPHEN (TYLENOL) 500 MG TABLET    Take 2 tablets (1,000 mg total) by mouth 2 (two) times daily.   DOCUSATE SODIUM (COLACE) 100 MG CAPSULE    Take 1 capsule (100 mg total) by mouth every 12 (twelve) hours.   FEEDING SUPPLEMENT, GLUCERNA SHAKE, (GLUCERNA SHAKE) LIQD    Take 237 mLs by mouth 2 (two) times daily between meals.   FENTANYL (DURAGESIC) 25 MCG/HR PATCH    Apply fresh patch every 3 days to help pain. Remove old patch.   NITROGLYCERIN (NITROSTAT) 0.4 MG SL TABLET    Place 0.4 mg under the tongue every 5 (five) minutes as needed for chest pain.  Modified Medications   No medications on file  Discontinued Medications   No medications on file     Physical Exam: Physical Exam  Constitutional: He is oriented  to person, place, and time.  Frail  HENT:  Head: Normocephalic and atraumatic.  Nose: Nose normal.  Bilateral hearing aids.  Eyes: Conjunctivae are normal. Pupils are equal, round, and reactive to light.  Prescription lenses.  Neck: Normal range of motion. Neck supple. No JVD present. No tracheal deviation present. No thyromegaly present.  Cardiovascular: Normal rate and regular rhythm.  Exam reveals no gallop and no friction rub.   Murmur heard.  Systolic murmur is present with a grade of 1/6  Absent DP and PT  Pulmonary/Chest: Effort normal and breath sounds normal. No respiratory distress. He has no wheezes. He has no rales. He exhibits no tenderness.  Using portable oxygen at 2 L/m today.  Abdominal: Soft. Bowel sounds are normal. He exhibits no distension and no mass. There is no tenderness.  Musculoskeletal: Normal range of motion. He exhibits no edema or tenderness.  Pain in the left SI joint area. Limps when walking. Favors the left side. Effusion of the left knee. Scars in the left knee from previous surgery. Tender at the left knee with movement of the patella. Positional lower back pain  Lymphadenopathy:    He has no cervical adenopathy.  Neurological: He is alert and oriented to person, place, and time. He has normal strength. A sensory deficit is present. No cranial nerve deficit. Gait abnormal.  07/13/14 MMSE 28/30. Passed clock drawing. Diminshed sensation to vabration and monofilament.  Skin: Skin is warm and dry. No erythema. No pallor.  Rough area on the left cheek. Cystic lesion in the mid forehead.  Psychiatric: He has a normal mood and affect. His behavior is normal. Thought content normal.  Patient is in denial regarding the extent of his memory deficit.   Filed Vitals:   06/03/15 1449  BP: 102/66  Pulse: 84  Temp: 98.4 F (36.9 C)  TempSrc: Tympanic  Resp: 18      Labs reviewed: Basic Metabolic Panel:  Recent Labs  07/30/14 1434  10/08/14 1603   05/20/15 1341 05/20/15 2038 05/21/15 0523 05/22/15 0533  NA 139  < >  --   < >  137  --  136 135  K 4.4  < >  --   < > 3.8  --  3.4* 4.4  CL 97  < >  --   < > 104  --  104 107  CO2 23  < >  --   < > 23  --  25 21*  GLUCOSE 130*  < >  --   < > 149*  --  137* 126*  BUN 26  < >  --   < > 64*  --  57* 33*  CREATININE 1.39*  < > 1.55*  < > 1.67*  --  1.47* 0.92  CALCIUM 9.6  < >  --   < > 9.5  --  8.8* 8.6*  MG  --   --  2.0  --   --   --   --   --   TSH 4.270  --  3.830  --   --  1.574  --   --   < > = values in this interval not displayed. Liver Function Tests:  Recent Labs  10/08/14 1021  05/03/15 1040 05/12/15 05/20/15 1341  AST 25  < > 21 24 35  ALT 11  < > 11 13 19   ALKPHOS 44  < > 66 86 122  BILITOT 0.9  --  0.8  --  0.9  PROT 7.3  --  7.1  --  6.8  ALBUMIN 3.6  --   --   --  3.1*  < > = values in this interval not displayed. No results for input(s): LIPASE, AMYLASE in the last 8760 hours. No results for input(s): AMMONIA in the last 8760 hours. CBC:  Recent Labs  05/03/15 1040 05/20/15 1341 05/21/15 0523 05/22/15 0533  WBC 9.6 8.3 6.3 5.4  NEUTROABS 6.8 5.9  --   --   HGB  --  13.1 11.5* 11.9*  HCT 42.1 39.0 34.6* 35.3*  MCV  --  90.9 90.1 90.1  PLT  --  182 147* 126*   Lipid Panel:  Recent Labs  11/01/14 02/07/15  CHOL 115 132  HDL 33* 33*  LDLCALC 48 76  TRIG 171* 115    Past Procedures:  08/09/14 CT head wo contrast:  IMPRESSION:  Abnormal CT head (without) demonstrating: 1. Moderate frontal, parietal, temporal atrophy. Mild ventriculomegaly on ex vacuo basis. 2. Mild chronic small vessel ischemic disease. 3. Right parietal convexity encephalomalacia, may represent chronic ischemic infarction. 4. No acute findings.  03/15/15 X-ray R knee  IMPRESSION: Negative for acute fracture or dislocation  03/15/15 X-ray L knee  IMPRESSION: Moderately large joint effusion. Negative for acute fracture or Dislocation. 05/20/15  CXR  IMPRESSION: No  acute cardiopulmonary disease.  Assessment/Plan Essential hypertension Blood pressure is controlled. off Losartan 25mg      CAD (coronary artery disease) No angina since admitted to SNF. Prn NTG, statin, and off Xarelto  Backache Continue Fentanyl patch. Activity as tolerated. Desires no further tx.    Memory loss Progressing. off Namenda R>B. SNF for care needs. Comfort measures.    DM type 2 with diabetic peripheral neuropathy Comfort measures.   Chronic combined systolic and diastolic CHF (congestive heart failure) O2 dependent, DOE apparent, activity as tolerated, diurese as needed to ensure comfort care.    Constipation 06/03/15 Lactulose 51ml qod    Paroxysmal atrial fibrillation Rate is in control w/o medications.    Vertebral compression fracture Etiology of the back pain. Continue Fentanyl for pain control.  Activity as tolerated.    Failure to thrive in adult Lost about #20Ibs recently. ADL dependency. Off Megace, Namenda, and Flomax-desires no diagnostic testing, consultations, aggressive tx. Plan of care is comfort measures. May have PT to eval and tx as indicated  06/03/15-improved overall, POA desires labs CBC, CMP, UA C/S, TSH     Family/ Staff Communication: observe the patient.   Goals of Care: SNF  Labs/tests ordered: CBC, CMP, UA C/S, TSH

## 2015-06-06 DIAGNOSIS — E119 Type 2 diabetes mellitus without complications: Secondary | ICD-10-CM | POA: Diagnosis not present

## 2015-06-06 DIAGNOSIS — R262 Difficulty in walking, not elsewhere classified: Secondary | ICD-10-CM | POA: Diagnosis not present

## 2015-06-06 DIAGNOSIS — D649 Anemia, unspecified: Secondary | ICD-10-CM | POA: Diagnosis not present

## 2015-06-06 DIAGNOSIS — R55 Syncope and collapse: Secondary | ICD-10-CM | POA: Diagnosis not present

## 2015-06-06 DIAGNOSIS — E039 Hypothyroidism, unspecified: Secondary | ICD-10-CM | POA: Diagnosis not present

## 2015-06-06 DIAGNOSIS — M25462 Effusion, left knee: Secondary | ICD-10-CM | POA: Diagnosis not present

## 2015-06-06 DIAGNOSIS — N390 Urinary tract infection, site not specified: Secondary | ICD-10-CM | POA: Diagnosis not present

## 2015-06-06 DIAGNOSIS — R296 Repeated falls: Secondary | ICD-10-CM | POA: Diagnosis not present

## 2015-06-06 DIAGNOSIS — Z96653 Presence of artificial knee joint, bilateral: Secondary | ICD-10-CM | POA: Diagnosis not present

## 2015-06-06 DIAGNOSIS — R413 Other amnesia: Secondary | ICD-10-CM | POA: Diagnosis not present

## 2015-06-06 DIAGNOSIS — M6281 Muscle weakness (generalized): Secondary | ICD-10-CM | POA: Diagnosis not present

## 2015-06-06 DIAGNOSIS — Z950 Presence of cardiac pacemaker: Secondary | ICD-10-CM | POA: Diagnosis not present

## 2015-06-06 LAB — HEPATIC FUNCTION PANEL
ALK PHOS: 162 U/L — AB (ref 25–125)
ALT: 20 U/L (ref 10–40)
AST: 27 U/L (ref 14–40)

## 2015-06-06 LAB — CBC AND DIFFERENTIAL
HEMATOCRIT: 37 % — AB (ref 41–53)
HEMOGLOBIN: 12 g/dL — AB (ref 13.5–17.5)
PLATELETS: 264 10*3/uL (ref 150–399)
WBC: 8.7 10^3/mL

## 2015-06-06 LAB — BASIC METABOLIC PANEL
BUN: 32 mg/dL — AB (ref 4–21)
CREATININE: 1 mg/dL (ref 0.6–1.3)
Glucose: 137 mg/dL
Potassium: 4.4 mmol/L (ref 3.4–5.3)
Sodium: 136 mmol/L — AB (ref 137–147)

## 2015-06-06 LAB — HEMOGLOBIN A1C: Hgb A1c MFr Bld: 6.7 % — AB (ref 4.0–6.0)

## 2015-06-06 LAB — TSH: TSH: 3.16 u[IU]/mL (ref 0.41–5.90)

## 2015-06-07 ENCOUNTER — Encounter: Payer: Self-pay | Admitting: Nurse Practitioner

## 2015-06-07 ENCOUNTER — Non-Acute Institutional Stay (SKILLED_NURSING_FACILITY): Payer: Medicare Other | Admitting: Nurse Practitioner

## 2015-06-07 DIAGNOSIS — N390 Urinary tract infection, site not specified: Secondary | ICD-10-CM | POA: Diagnosis not present

## 2015-06-07 DIAGNOSIS — I5042 Chronic combined systolic (congestive) and diastolic (congestive) heart failure: Secondary | ICD-10-CM

## 2015-06-07 DIAGNOSIS — K5900 Constipation, unspecified: Secondary | ICD-10-CM

## 2015-06-07 DIAGNOSIS — D509 Iron deficiency anemia, unspecified: Secondary | ICD-10-CM | POA: Diagnosis not present

## 2015-06-07 DIAGNOSIS — M4850XS Collapsed vertebra, not elsewhere classified, site unspecified, sequela of fracture: Secondary | ICD-10-CM

## 2015-06-07 DIAGNOSIS — N189 Chronic kidney disease, unspecified: Secondary | ICD-10-CM

## 2015-06-07 DIAGNOSIS — B372 Candidiasis of skin and nail: Secondary | ICD-10-CM | POA: Diagnosis not present

## 2015-06-07 DIAGNOSIS — IMO0001 Reserved for inherently not codable concepts without codable children: Secondary | ICD-10-CM

## 2015-06-07 DIAGNOSIS — E1122 Type 2 diabetes mellitus with diabetic chronic kidney disease: Secondary | ICD-10-CM | POA: Diagnosis not present

## 2015-06-07 DIAGNOSIS — I480 Paroxysmal atrial fibrillation: Secondary | ICD-10-CM

## 2015-06-07 DIAGNOSIS — I10 Essential (primary) hypertension: Secondary | ICD-10-CM

## 2015-06-07 DIAGNOSIS — R413 Other amnesia: Secondary | ICD-10-CM | POA: Diagnosis not present

## 2015-06-07 DIAGNOSIS — E43 Unspecified severe protein-calorie malnutrition: Secondary | ICD-10-CM

## 2015-06-07 NOTE — Assessment & Plan Note (Signed)
06/03/15 Lactulose 71ml qod

## 2015-06-07 NOTE — Assessment & Plan Note (Signed)
Intergluteal folds-Nystatin powder with incontinent care or at least tid. Leave off adult brief while in bed. Diflucan 100mg  po daily x 3 days.

## 2015-06-07 NOTE — Assessment & Plan Note (Signed)
06/06/15 Hgb A1c 6.7 diet control.

## 2015-06-07 NOTE — Assessment & Plan Note (Signed)
O2 dependent, DOE apparent, activity as tolerated, diurese as needed to ensure comfort care.

## 2015-06-07 NOTE — Assessment & Plan Note (Signed)
Progressing. off Namenda R>B. SNF for care needs. Comfort measures.

## 2015-06-07 NOTE — Assessment & Plan Note (Signed)
Etiology of the back pain. Continue Fentanyl for pain control. Activity as tolerated.

## 2015-06-07 NOTE — Assessment & Plan Note (Signed)
06/06/15 Hgb 12.0

## 2015-06-07 NOTE — Assessment & Plan Note (Signed)
Blood pressure is controlled. off Losartan 25mg 

## 2015-06-07 NOTE — Progress Notes (Signed)
Patient ID: Zachary Harris, male   DOB: [birth year], 81 y.o.   MRN: [id number]   Code Status: DNR comfort measures.   Allergies  Allergen Reactions  . Hydrocodone     hallucination  . Tramadol     hallucination    Chief Complaint  Patient presents with  . Medical Management of Chronic Issues  . Acute Visit    excoriated buttocks.     HPI: Patient is a 81 y.o. male seen in the SNF at [facility] today for evaluation of excoriated buttocks and chronic medical conditions.     Hospitalized 05/20/2015-05/23/2015 for back pain(Vertebral compression fx) and FTT. The patient was transitioned to comfort care. Palliative care consulted and family members wished to focus his care to comfort, avoiding any further potential burdensome or invasive interventions.   Palliative care        ED eval 03/15/15 bilateral knee pain after fall. X-ray show effusion of the left knee. No fractures or dislocations, arthroplasty is intact bilaterally. As he is on xarelto, he is not a good candidate for arthrocentesis to drain effusion.  Problem List Items Addressed This Visit    Essential hypertension (Chronic)    Blood pressure is controlled. off Losartan 25mg          Memory loss (Chronic)    Progressing. off Namenda R>B. SNF for care needs. Comfort measures.         Chronic combined systolic and diastolic CHF (congestive heart failure) (Chronic)    O2 dependent, DOE apparent, activity as tolerated, diurese as needed to ensure comfort care.        DM (diabetes mellitus), type 2 with renal complications (Chronic)    06/06/15 Hgb A1c 6.7 diet control.        Iron deficiency anemia    06/06/15 Hgb 12.0      Vertebral compression fracture    Etiology of the back pain. Continue Fentanyl for pain control. Activity as tolerated.        Paroxysmal atrial fibrillation    Rate is in control w/o medications.        Protein-calorie malnutrition, severe    06/06/15 total protein 5.6,  albumin 2.7 Continue dietary supplement.        Constipation    06/03/15 Lactulose 48ml qod        Candidiasis of skin - Primary    Intergluteal folds-Nystatin powder with incontinent care or at least tid. Leave off adult brief while in bed. Diflucan 100mg  po daily x 3 days.           Review of Systems:  Review of Systems  Constitutional: Negative for fever, chills, activity change, appetite change, fatigue and unexpected weight change.  HENT: Positive for hearing loss. Negative for ear pain and sinus pressure.   Eyes: Negative for photophobia, pain and visual disturbance.       Corrective lenses  Respiratory: Positive for shortness of breath. Negative for cough, chest tightness and wheezing.   Cardiovascular: Negative for leg swelling.       Pacemaker. History of syncope related to cardiac arrhythmia.  Gastrointestinal: Negative for abdominal pain and abdominal distention.  Endocrine: Negative for cold intolerance, heat intolerance, polydipsia, polyphagia and polyuria.       History of diabetes.  Genitourinary: Positive for urgency and frequency. Negative for dysuria and flank pain.       Urge incontinence. Increased frequency which has not responded well to Cottle. Continues to see urologist. Now taking oxybutynin 3 times  a day. Episodes of incontinence persist.  Musculoskeletal: Positive for myalgias, back pain, arthralgias, gait problem and neck pain (Fall on 03/15/15 causing pain in the left knee and effusion.).       Poor balance and gait disturbance. Lower back pain  Skin:       Rough area on left cheek Intergluteal folds excoriated and reddened  Allergic/Immunologic: Negative.   Neurological: Negative for tremors, seizures, facial asymmetry and weakness.       Memory loss  Hematological: Negative.   Psychiatric/Behavioral: Positive for confusion.       Mild to moderate memory disturbance.     Past Medical History  Diagnosis Date  . ANEMIA-IRON DEFICIENCY  05/27/2007  . ANXIETY 11/14/2007  . Intermittent high-grade heart block 09/05/2009    s/p pacemaker  . BENIGN PROSTATIC HYPERTROPHY 11/14/2007  . CAROTID ARTERY DISEASE 10/02/2010  . Pacemaker-St. Jude 02/02/2008    St. Jude  . COLONIC POLYPS, HX OF 05/27/2007  . CORONARY ARTERY DISEASE 11/14/2007    prior bypass (581) [phone]; Inferior MI 11/07; Severe 3 vessel disease; occluded SVG to RCA; SVG to OM subtotalled, SVG to diagonal with ostial 70 and 80 mid; EF 45. PCI of SVG to OM. FU myovue showed EF 56 with inferior infarct and very mild peri-infarct ischemia  . DEPRESSION 11/14/2007  . DIABETES MELLITUS, TYPE II 11/14/2007  . DIVERTICULITIS, HX OF 05/27/2007  . DVT (deep venous thrombosis) 03/21/2011  . FOOT PAIN, BILATERAL 12/08/2010  . GERD 11/14/2007  . HYPERLIPIDEMIA 11/14/2007  . HYPERTENSION 11/14/2007  . HYPOTENSION, ORTHOSTATIC 03/06/2011  . Memory loss 06/01/2008  . OSTEOPOROSIS 06/02/2010  . PERIPHERAL VASCULAR DISEASE 11/14/2007  . RENAL INSUFFICIENCY 11/14/2007  . SECONDARY DM W/PERIPHERAL CIRC D/O UNCONTROLLED 11/07/2010  . SYNCOPE 09/08/2008  . Unspecified hearing loss 05/13/2009  . Prostate cancer   . Myalgia and myositis, unspecified 04/24/2011  . Other malaise and fatigue 04/24/2011  . Peripheral neuropathy 03/27/2011  . Seborrheic keratosis 06/05/2011  . Osteoarthrosis, unspecified whether generalized or localized, unspecified site   . Ischemic cardiomyopathy     a. echo (10/15):  mod focal basal septal hypertrophy, EF 40-45%, inf AK, Gr 1 DD, Al sclerosis, mild AI, MAC, mild MR, mod LAE, lipomatous hypertrophy, small effusion (no hemodynamic compromise)   . Effusion of left knee 03/17/2015  . Severe back pain   . DM (diabetes mellitus), type 2 with renal complications 0/27/2536  . Failure to thrive in adult 05/03/2015  . Chronic combined systolic and diastolic CHF (congestive heart failure) 10/10/2014  . CKD stage 3 due to type 1 diabetes mellitus 03/17/2015  . Dyspnea 11/09/2014    . Hearing loss 05/13/2009    Qualifier: Diagnosis of  By: Jenny Reichmann MD, Hunt Oris   . Hyperlipemia 11/14/2007    Qualifier: Diagnosis of  By: Jenny Reichmann MD, Hunt Oris    . Generalized weakness 05/20/2015  . Protein-calorie malnutrition, severe 05/23/2015  . Vertebral compression fracture 09/05/2009    Qualifier: Diagnosis of  By: Jenny Reichmann MD, Hunt Oris   . [name] 04/04/2015  . Weight loss 10/10/2014   Past Surgical History  Procedure Laterality Date  . S/p coronary stent x 1    . Cholecystectomy    . S/p bilat knee replacement  Cleon Gustin, MD  . Pacemaker insertion  2012    Caryl Comes, MD  . Prostate needle biopsy    . Cystoscopy    . Appendectomy    . Coronary artery bypass graft  2007   Social  History:   reports that he quit smoking about 31 years ago. His smoking use included Cigarettes. He has a 50 pack-year smoking history. He has never used smokeless tobacco. He reports that he does not drink alcohol or use illicit drugs.  Family History  Problem Relation Age of Onset  . Diabetes Father     68  . Cancer Mother     Medications: Patient's Medications  New Prescriptions   No medications on file  Previous Medications   ACETAMINOPHEN (TYLENOL) 500 MG TABLET    Take 2 tablets (1,000 mg total) by mouth 2 (two) times daily.   DOCUSATE SODIUM (COLACE) 100 MG CAPSULE    Take 1 capsule (100 mg total) by mouth every 12 (twelve) hours.   FEEDING SUPPLEMENT, GLUCERNA SHAKE, (GLUCERNA SHAKE) LIQD    Take 237 mLs by mouth 2 (two) times daily between meals.   FENTANYL (DURAGESIC) 25 MCG/HR PATCH    Apply fresh patch every 3 days to help pain. Remove old patch.   NITROGLYCERIN (NITROSTAT) 0.4 MG SL TABLET    Place 0.4 mg under the tongue every 5 (five) minutes as needed for chest pain.  Modified Medications   No medications on file  Discontinued Medications   No medications on file     Physical Exam: Physical Exam  Constitutional: He is oriented to person, place, and time.  Frail  HENT:  Head:  Normocephalic and atraumatic.  Nose: Nose normal.  Bilateral hearing aids.  Eyes: Conjunctivae are normal. Pupils are equal, round, and reactive to light.  Prescription lenses.  Neck: Normal range of motion. Neck supple. No JVD present. No tracheal deviation present. No thyromegaly present.  Cardiovascular: Normal rate and regular rhythm.  Exam reveals no gallop and no friction rub.   Murmur heard.  Systolic murmur is present with a grade of 1/6  Absent DP and PT  Pulmonary/Chest: Effort normal and breath sounds normal. No respiratory distress. He has no wheezes. He has no rales. He exhibits no tenderness.  Using portable oxygen at 2 L/m today.  Abdominal: Soft. Bowel sounds are normal. He exhibits no distension and no mass. There is no tenderness.  Musculoskeletal: Normal range of motion. He exhibits no edema or tenderness.  Pain in the left SI joint area. Limps when walking. Favors the left side. Effusion of the left knee. Scars in the left knee from previous surgery. Tender at the left knee with movement of the patella. Positional lower back pain  Lymphadenopathy:    He has no cervical adenopathy.  Neurological: He is alert and oriented to person, place, and time. He has normal strength. A sensory deficit is present. No cranial nerve deficit. Gait abnormal.  07/13/14 MMSE 28/30. Passed clock drawing. Diminshed sensation to vabration and monofilament.  Skin: Skin is warm and dry. No erythema. No pallor.  Rough area on the left cheek. Cystic lesion in the mid forehead. Intergluteal folds excoriated and reddened    Psychiatric: He has a normal mood and affect. His behavior is normal. Thought content normal.  Patient is in denial regarding the extent of his memory deficit.   Filed Vitals:   06/07/15 1103  BP: 108/88  Pulse: 84  Temp: 99.4 F (37.4 C)  TempSrc: Tympanic  Resp: 20      Labs reviewed: Basic Metabolic Panel:  Recent Labs  10/08/14 1603  05/20/15 1341  05/20/15 2038 05/21/15 0523 05/22/15 0533 06/06/15  NA  --   < > 137  --  136  135 136*  K  --   < > 3.8  --  3.4* 4.4 4.4  CL  --   < > 104  --  104 107  --   CO2  --   < > 23  --  25 21*  --   GLUCOSE  --   < > 149*  --  137* 126*  --   BUN  --   < > 64*  --  57* 33* 32*  CREATININE 1.55*  < > 1.67*  --  1.47* 0.92 1.0  CALCIUM  --   < > 9.5  --  8.8* 8.6*  --   MG 2.0  --   --   --   --   --   --   TSH 3.830  --   --  1.574  --   --  3.16  < > = values in this interval not displayed. Liver Function Tests:  Recent Labs  10/08/14 1021  05/03/15 1040 05/12/15 05/20/15 1341 06/06/15  AST 25  < > 21 24 35 27  ALT 11  < > 11 13 19 20   ALKPHOS 44  < > 66 86 122 162*  BILITOT 0.9  --  0.8  --  0.9  --   PROT 7.3  --  7.1  --  6.8  --   ALBUMIN 3.6  --   --   --  3.1*  --   < > = values in this interval not displayed. No results for input(s): LIPASE, AMYLASE in the last 8760 hours. No results for input(s): AMMONIA in the last 8760 hours. CBC:  Recent Labs  05/03/15 1040 05/20/15 1341 05/21/15 0523 05/22/15 0533 06/06/15  WBC 9.6 8.3 6.3 5.4 8.7  NEUTROABS 6.8 5.9  --   --   --   HGB  --  13.1 11.5* 11.9* 12.0*  HCT 42.1 39.0 34.6* 35.3* 37*  MCV  --  90.9 90.1 90.1  --   PLT  --  182 147* 126* 264   Lipid Panel:  Recent Labs  11/01/14 02/07/15  CHOL 115 132  HDL 33* 33*  LDLCALC 48 76  TRIG 171* 115    Past Procedures:  08/09/14 CT head wo contrast:  IMPRESSION:  Abnormal CT head (without) demonstrating: 1. Moderate frontal, parietal, temporal atrophy. Mild ventriculomegaly on ex vacuo basis. 2. Mild chronic small vessel ischemic disease. 3. Right parietal convexity encephalomalacia, may represent chronic ischemic infarction. 4. No acute findings.  03/15/15 X-ray R knee  IMPRESSION: Negative for acute fracture or dislocation  03/15/15 X-ray L knee  IMPRESSION: Moderately large joint effusion. Negative for acute fracture or Dislocation. 05/20/15   CXR  IMPRESSION: No acute cardiopulmonary disease.  Assessment/Plan Candidiasis of skin Intergluteal folds-Nystatin powder with incontinent care or at least tid. Leave off adult brief while in bed. Diflucan 100mg  po daily x 3 days.    Constipation 06/03/15 Lactulose 2ml qod    Protein-calorie malnutrition, severe 06/06/15 total protein 5.6, albumin 2.7 Continue dietary supplement.    Paroxysmal atrial fibrillation Rate is in control w/o medications.    Vertebral compression fracture Etiology of the back pain. Continue Fentanyl for pain control. Activity as tolerated.    Iron deficiency anemia 06/06/15 Hgb 12.0  DM (diabetes mellitus), type 2 with renal complications 9/70/26 Hgb A1c 6.7 diet control.    Chronic combined systolic and diastolic CHF (congestive heart failure) O2 dependent, DOE apparent, activity as tolerated, diurese as needed to  ensure comfort care.    Memory loss Progressing. off Namenda R>B. SNF for care needs. Comfort measures.     Essential hypertension Blood pressure is controlled. off Losartan 25mg        Family/ Staff Communication: observe the patient.   Goals of Care: SNF  Labs/tests ordered: none

## 2015-06-07 NOTE — Assessment & Plan Note (Signed)
Rate is in control w/o medications.

## 2015-06-07 NOTE — Assessment & Plan Note (Signed)
06/06/15 total protein 5.6, albumin 2.7 Continue dietary supplement.

## 2015-06-08 DIAGNOSIS — Z96653 Presence of artificial knee joint, bilateral: Secondary | ICD-10-CM | POA: Diagnosis not present

## 2015-06-08 DIAGNOSIS — R413 Other amnesia: Secondary | ICD-10-CM | POA: Diagnosis not present

## 2015-06-08 DIAGNOSIS — Z950 Presence of cardiac pacemaker: Secondary | ICD-10-CM | POA: Diagnosis not present

## 2015-06-08 DIAGNOSIS — M6281 Muscle weakness (generalized): Secondary | ICD-10-CM | POA: Diagnosis not present

## 2015-06-08 DIAGNOSIS — M25462 Effusion, left knee: Secondary | ICD-10-CM | POA: Diagnosis not present

## 2015-06-08 DIAGNOSIS — R296 Repeated falls: Secondary | ICD-10-CM | POA: Diagnosis not present

## 2015-06-08 DIAGNOSIS — R262 Difficulty in walking, not elsewhere classified: Secondary | ICD-10-CM | POA: Diagnosis not present

## 2015-06-08 DIAGNOSIS — R55 Syncope and collapse: Secondary | ICD-10-CM | POA: Diagnosis not present

## 2015-06-10 ENCOUNTER — Encounter: Payer: Self-pay | Admitting: Nurse Practitioner

## 2015-06-10 ENCOUNTER — Non-Acute Institutional Stay (SKILLED_NURSING_FACILITY): Payer: Medicare Other | Admitting: Nurse Practitioner

## 2015-06-10 DIAGNOSIS — B372 Candidiasis of skin and nail: Secondary | ICD-10-CM | POA: Diagnosis not present

## 2015-06-10 DIAGNOSIS — G629 Polyneuropathy, unspecified: Secondary | ICD-10-CM

## 2015-06-10 DIAGNOSIS — I10 Essential (primary) hypertension: Secondary | ICD-10-CM

## 2015-06-10 DIAGNOSIS — M5440 Lumbago with sciatica, unspecified side: Secondary | ICD-10-CM

## 2015-06-10 DIAGNOSIS — R413 Other amnesia: Secondary | ICD-10-CM | POA: Diagnosis not present

## 2015-06-10 DIAGNOSIS — E1142 Type 2 diabetes mellitus with diabetic polyneuropathy: Secondary | ICD-10-CM

## 2015-06-10 DIAGNOSIS — Z950 Presence of cardiac pacemaker: Secondary | ICD-10-CM | POA: Diagnosis not present

## 2015-06-10 DIAGNOSIS — M25462 Effusion, left knee: Secondary | ICD-10-CM | POA: Diagnosis not present

## 2015-06-10 DIAGNOSIS — I5042 Chronic combined systolic (congestive) and diastolic (congestive) heart failure: Secondary | ICD-10-CM

## 2015-06-10 DIAGNOSIS — R262 Difficulty in walking, not elsewhere classified: Secondary | ICD-10-CM | POA: Diagnosis not present

## 2015-06-10 DIAGNOSIS — K5900 Constipation, unspecified: Secondary | ICD-10-CM

## 2015-06-10 DIAGNOSIS — N390 Urinary tract infection, site not specified: Secondary | ICD-10-CM | POA: Diagnosis not present

## 2015-06-10 DIAGNOSIS — I480 Paroxysmal atrial fibrillation: Secondary | ICD-10-CM | POA: Diagnosis not present

## 2015-06-10 DIAGNOSIS — R55 Syncope and collapse: Secondary | ICD-10-CM | POA: Diagnosis not present

## 2015-06-10 DIAGNOSIS — I2510 Atherosclerotic heart disease of native coronary artery without angina pectoris: Secondary | ICD-10-CM

## 2015-06-10 DIAGNOSIS — I2583 Coronary atherosclerosis due to lipid rich plaque: Secondary | ICD-10-CM

## 2015-06-10 DIAGNOSIS — R296 Repeated falls: Secondary | ICD-10-CM | POA: Diagnosis not present

## 2015-06-10 DIAGNOSIS — Z96653 Presence of artificial knee joint, bilateral: Secondary | ICD-10-CM | POA: Diagnosis not present

## 2015-06-10 DIAGNOSIS — M6281 Muscle weakness (generalized): Secondary | ICD-10-CM | POA: Diagnosis not present

## 2015-06-10 NOTE — Assessment & Plan Note (Signed)
06/06/15 Hgb A1c 6.7 diet control.

## 2015-06-10 NOTE — Assessment & Plan Note (Signed)
Much improved, rash in Intergluteal folds-Nystatin powder with incontinent care or at least tid. Leave off adult brief while in bed. Diflucan 100mg  po daily x 3 days.

## 2015-06-10 NOTE — Assessment & Plan Note (Signed)
Progressing. off Namenda R>B. SNF for care needs. Comfort measures.

## 2015-06-10 NOTE — Assessment & Plan Note (Addendum)
06/03/15 Lactulose 40ml qod  06/10/15 Lactulose prn.

## 2015-06-10 NOTE — Assessment & Plan Note (Signed)
05/01/13 Citrobacter koseri: Treated with Rocephin then Vantin. 06/10/15 enterococcus Nitrofurantoin 100mg  bid x 10 days.

## 2015-06-10 NOTE — Assessment & Plan Note (Signed)
Rate is in control w/o medications.

## 2015-06-10 NOTE — Assessment & Plan Note (Signed)
Blood pressure is controlled. off Losartan 25mg 

## 2015-06-10 NOTE — Assessment & Plan Note (Signed)
No angina since admitted to SNF. Prn NTG, statin, and off Xarelto

## 2015-06-10 NOTE — Assessment & Plan Note (Signed)
O2 dependent, DOE apparent, activity as tolerated, diurese as needed to ensure comfort care.

## 2015-06-10 NOTE — Progress Notes (Signed)
Patient ID: Zachary Harris, male   DOB: Oct 12, 1923, 81 y.o.   MRN: [id number]   Code Status: DNR comfort measures.   Allergies  Allergen Reactions  . Hydrocodone     hallucination  . Tramadol     hallucination    Chief Complaint  Patient presents with  . Medical Management of Chronic Issues  . Acute Visit    UTI    HPI: Patient is a 81 y.o. male seen in the SNF at [facility] today for evaluation of UTI and chronic medical conditions.     Hospitalized 05/20/2015-05/23/2015 for back pain(Vertebral compression fx) and FTT. The patient was transitioned to comfort care. Palliative care consulted and family members wished to focus his care to comfort, avoiding any further potential burdensome or invasive interventions.   Palliative care        ED eval 03/15/15 bilateral knee pain after fall. X-ray show effusion of the left knee. No fractures or dislocations, arthroplasty is intact bilaterally. As he is on xarelto, he is not a good candidate for arthrocentesis to drain effusion.  Problem List Items Addressed This Visit    Essential hypertension (Chronic)    Blood pressure is controlled. off Losartan 25mg          CAD (coronary artery disease) (Chronic)    No angina since admitted to SNF. Prn NTG, statin, and off Xarelto       Backache (Chronic)    Etiology of the back pain. Continue Fentanyl for pain control. Activity as tolerated.         Memory loss (Chronic)    Progressing. off Namenda R>B. SNF for care needs. Comfort measures.         DM type 2 with diabetic peripheral neuropathy (Chronic)    06/06/15 Hgb A1c 6.7 diet control.         Chronic combined systolic and diastolic CHF (congestive heart failure) (Chronic)    O2 dependent, DOE apparent, activity as tolerated, diurese as needed to ensure comfort care.         Infection of urinary tract - Primary    05/01/13 Citrobacter koseri: Treated with Rocephin then Vantin. 06/10/15 enterococcus  Nitrofurantoin 100mg  bid x 10 days.      Paroxysmal atrial fibrillation    Rate is in control w/o medications.         Constipation    06/03/15 Lactulose 58ml qod  06/10/15 Lactulose prn.       Candidiasis of skin    Much improved, rash in Intergluteal folds-Nystatin powder with incontinent care or at least tid. Leave off adult brief while in bed. Diflucan 100mg  po daily x 3 days.          Review of Systems:  Review of Systems  Constitutional: Negative for fever, chills, activity change, appetite change, fatigue and unexpected weight change.  HENT: Positive for hearing loss. Negative for ear pain and sinus pressure.   Eyes: Negative for photophobia, pain and visual disturbance.       Corrective lenses  Respiratory: Positive for shortness of breath. Negative for cough, chest tightness and wheezing.   Cardiovascular: Negative for leg swelling.       Pacemaker. History of syncope related to cardiac arrhythmia.  Gastrointestinal: Negative for abdominal pain and abdominal distention.  Endocrine: Negative for cold intolerance, heat intolerance, polydipsia, polyphagia and polyuria.       History of diabetes.  Genitourinary: Positive for urgency and frequency. Negative for dysuria and flank pain.  Urge incontinence. Increased frequency which has not responded well to [place]. Continues to see urologist. Now taking oxybutynin 3 times a day. Episodes of incontinence persist.  Musculoskeletal: Positive for myalgias, back pain, arthralgias, gait problem and neck pain (Fall on 03/15/15 causing pain in the left knee and effusion.).       Poor balance and gait disturbance. Lower back pain  Skin:       Rough area on left cheek Intergluteal folds excoriated and reddened  Allergic/Immunologic: Negative.   Neurological: Negative for tremors, seizures, facial asymmetry and weakness.       Memory loss  Hematological: Negative.   Psychiatric/Behavioral: Positive for confusion.       Mild to  moderate memory disturbance.     Past Medical History  Diagnosis Date  . ANEMIA-IRON DEFICIENCY 05/27/2007  . ANXIETY 11/14/2007  . Intermittent high-grade heart block 09/05/2009    s/p pacemaker  . BENIGN PROSTATIC HYPERTROPHY 11/14/2007  . CAROTID ARTERY DISEASE 10/02/2010  . Pacemaker-St. Jude 02/02/2008    St. Jude  . COLONIC POLYPS, HX OF 05/27/2007  . CORONARY ARTERY DISEASE 11/14/2007    prior bypass [id number]; Inferior MI 11/07; Severe 3 vessel disease; occluded SVG to RCA; SVG to OM subtotalled, SVG to diagonal with ostial 70 and 80 mid; EF 45. PCI of SVG to OM. FU myovue showed EF 56 with inferior infarct and very mild peri-infarct ischemia  . DEPRESSION 11/14/2007  . DIABETES MELLITUS, TYPE II 11/14/2007  . DIVERTICULITIS, HX OF 05/27/2007  . DVT (deep venous thrombosis) 03/21/2011  . FOOT PAIN, BILATERAL 12/08/2010  . GERD 11/14/2007  . HYPERLIPIDEMIA 11/14/2007  . HYPERTENSION 11/14/2007  . HYPOTENSION, ORTHOSTATIC 03/06/2011  . Memory loss 06/01/2008  . OSTEOPOROSIS 06/02/2010  . PERIPHERAL VASCULAR DISEASE 11/14/2007  . RENAL INSUFFICIENCY 11/14/2007  . SECONDARY DM W/PERIPHERAL CIRC D/O UNCONTROLLED 11/07/2010  . SYNCOPE 09/08/2008  . Unspecified hearing loss 05/13/2009  . Prostate cancer   . Myalgia and myositis, unspecified 04/24/2011  . Other malaise and fatigue 04/24/2011  . Peripheral neuropathy 03/27/2011  . Seborrheic keratosis 06/05/2011  . Osteoarthrosis, unspecified whether generalized or localized, unspecified site   . Ischemic cardiomyopathy     a. echo (10/15):  mod focal basal septal hypertrophy, EF 40-45%, inf AK, Gr 1 DD, Al sclerosis, mild AI, MAC, mild MR, mod LAE, lipomatous hypertrophy, small effusion (no hemodynamic compromise)   . Effusion of left knee 03/17/2015  . Severe back pain   . DM (diabetes mellitus), type 2 with renal complications 9/41/7408  . Failure to thrive in adult 05/03/2015  . Chronic combined systolic and diastolic CHF (congestive heart  failure) 10/10/2014  . CKD stage 3 due to type 1 diabetes mellitus 03/17/2015  . Dyspnea 11/09/2014  . Hearing loss 05/13/2009    Qualifier: Diagnosis of  By: Jenny Reichmann MD, Hunt Oris   . Hyperlipemia 11/14/2007    Qualifier: Diagnosis of  By: Jenny Reichmann MD, Hunt Oris    . Generalized weakness 05/20/2015  . Protein-calorie malnutrition, severe 05/23/2015  . Vertebral compression fracture 09/05/2009    Qualifier: Diagnosis of  By: Jenny Reichmann MD, Hunt Oris   . [name] 04/04/2015  . Weight loss 10/10/2014   Past Surgical History  Procedure Laterality Date  . S/p coronary stent x 1    . Cholecystectomy    . S/p bilat knee replacement  Cleon Gustin, MD  . Pacemaker insertion  2012    Caryl Comes, MD  . Prostate needle biopsy    .  Cystoscopy    . Appendectomy    . Coronary artery bypass graft  2007   Social History:   reports that he quit smoking about 31 years ago. His smoking use included Cigarettes. He has a 50 pack-year smoking history. He has never used smokeless tobacco. He reports that he does not drink alcohol or use illicit drugs.  Family History  Problem Relation Age of Onset  . Diabetes Father     44  . Cancer Mother     Medications: Patient's Medications  New Prescriptions   No medications on file  Previous Medications   ACETAMINOPHEN (TYLENOL) 500 MG TABLET    Take 2 tablets (1,000 mg total) by mouth 2 (two) times daily.   DOCUSATE SODIUM (COLACE) 100 MG CAPSULE    Take 1 capsule (100 mg total) by mouth every 12 (twelve) hours.   FEEDING SUPPLEMENT, GLUCERNA SHAKE, (GLUCERNA SHAKE) LIQD    Take 237 mLs by mouth 2 (two) times daily between meals.   FENTANYL (DURAGESIC) 25 MCG/HR PATCH    Apply fresh patch every 3 days to help pain. Remove old patch.   NITROGLYCERIN (NITROSTAT) 0.4 MG SL TABLET    Place 0.4 mg under the tongue every 5 (five) minutes as needed for chest pain.  Modified Medications   No medications on file  Discontinued Medications   No medications on file     Physical  Exam: Physical Exam  Constitutional: He is oriented to person, place, and time.  Frail  HENT:  Head: Normocephalic and atraumatic.  Nose: Nose normal.  Bilateral hearing aids.  Eyes: Conjunctivae are normal. Pupils are equal, round, and reactive to light.  Prescription lenses.  Neck: Normal range of motion. Neck supple. No JVD present. No tracheal deviation present. No thyromegaly present.  Cardiovascular: Normal rate and regular rhythm.  Exam reveals no gallop and no friction rub.   Murmur heard.  Systolic murmur is present with a grade of 1/6  Absent DP and PT  Pulmonary/Chest: Effort normal and breath sounds normal. No respiratory distress. He has no wheezes. He has no rales. He exhibits no tenderness.  Using portable oxygen at 2 L/m today.  Abdominal: Soft. Bowel sounds are normal. He exhibits no distension and no mass. There is no tenderness.  Musculoskeletal: Normal range of motion. He exhibits no edema or tenderness.  Pain in the left SI joint area. Limps when walking. Favors the left side. Effusion of the left knee. Scars in the left knee from previous surgery. Tender at the left knee with movement of the patella. Positional lower back pain  Lymphadenopathy:    He has no cervical adenopathy.  Neurological: He is alert and oriented to person, place, and time. He has normal strength. A sensory deficit is present. No cranial nerve deficit. Gait abnormal.  07/13/14 MMSE 28/30. Passed clock drawing. Diminshed sensation to vabration and monofilament.  Skin: Skin is warm and dry. No erythema. No pallor.  Rough area on the left cheek. Cystic lesion in the mid forehead. Much improved Intergluteal folds excoriated and reddened    Psychiatric: He has a normal mood and affect. His behavior is normal. Thought content normal.  Patient is in denial regarding the extent of his memory deficit.   Filed Vitals:   06/10/15 1114  BP: 118/64  Pulse: 76  Temp: 98.9 F (37.2 C)  TempSrc:  Tympanic  Resp: 18      Labs reviewed: Basic Metabolic Panel:  Recent Labs  10/08/14 1603  05/20/15  1341 05/20/15 2038 05/21/15 0523 05/22/15 0533 06/06/15  NA  --   < > 137  --  136 135 136*  K  --   < > 3.8  --  3.4* 4.4 4.4  CL  --   < > 104  --  104 107  --   CO2  --   < > 23  --  25 21*  --   GLUCOSE  --   < > 149*  --  137* 126*  --   BUN  --   < > 64*  --  57* 33* 32*  CREATININE 1.55*  < > 1.67*  --  1.47* 0.92 1.0  CALCIUM  --   < > 9.5  --  8.8* 8.6*  --   MG 2.0  --   --   --   --   --   --   TSH 3.830  --   --  1.574  --   --  3.16  < > = values in this interval not displayed. Liver Function Tests:  Recent Labs  10/08/14 1021  05/03/15 1040 05/12/15 05/20/15 1341 06/06/15  AST 25  < > 21 24 35 27  ALT 11  < > 11 13 19 20   ALKPHOS 44  < > 66 86 122 162*  BILITOT 0.9  --  0.8  --  0.9  --   PROT 7.3  --  7.1  --  6.8  --   ALBUMIN 3.6  --   --   --  3.1*  --   < > = values in this interval not displayed. No results for input(s): LIPASE, AMYLASE in the last 8760 hours. No results for input(s): AMMONIA in the last 8760 hours. CBC:  Recent Labs  05/03/15 1040 05/20/15 1341 05/21/15 0523 05/22/15 0533 06/06/15  WBC 9.6 8.3 6.3 5.4 8.7  NEUTROABS 6.8 5.9  --   --   --   HGB  --  13.1 11.5* 11.9* 12.0*  HCT 42.1 39.0 34.6* 35.3* 37*  MCV  --  90.9 90.1 90.1  --   PLT  --  182 147* 126* 264   Lipid Panel:  Recent Labs  11/01/14 02/07/15  CHOL 115 132  HDL 33* 33*  LDLCALC 48 76  TRIG 171* 115    Past Procedures:  08/09/14 CT head wo contrast:  IMPRESSION:  Abnormal CT head (without) demonstrating: 1. Moderate frontal, parietal, temporal atrophy. Mild ventriculomegaly on ex vacuo basis. 2. Mild chronic small vessel ischemic disease. 3. Right parietal convexity encephalomalacia, may represent chronic ischemic infarction. 4. No acute findings.  03/15/15 X-ray R knee  IMPRESSION: Negative for acute fracture or dislocation  03/15/15  X-ray L knee  IMPRESSION: Moderately large joint effusion. Negative for acute fracture or Dislocation. 05/20/15  CXR  IMPRESSION: No acute cardiopulmonary disease.  Assessment/Plan Infection of urinary tract 05/01/13 Citrobacter koseri: Treated with Rocephin then Vantin. 06/10/15 enterococcus Nitrofurantoin 100mg  bid x 10 days.  Essential hypertension Blood pressure is controlled. off Losartan 25mg      CAD (coronary artery disease) No angina since admitted to SNF. Prn NTG, statin, and off Xarelto   Backache Etiology of the back pain. Continue Fentanyl for pain control. Activity as tolerated.     Memory loss Progressing. off Namenda R>B. SNF for care needs. Comfort measures.     DM type 2 with diabetic peripheral neuropathy 06/06/15 Hgb A1c 6.7 diet control.     Chronic combined systolic and  diastolic CHF (congestive heart failure) O2 dependent, DOE apparent, activity as tolerated, diurese as needed to ensure comfort care.     Paroxysmal atrial fibrillation Rate is in control w/o medications.     Constipation 06/03/15 Lactulose 38ml qod  06/10/15 Lactulose prn.   Candidiasis of skin Much improved, rash in Intergluteal folds-Nystatin powder with incontinent care or at least tid. Leave off adult brief while in bed. Diflucan 100mg  po daily x 3 days.     Family/ Staff Communication: observe the patient.   Goals of Care: SNF  Labs/tests ordered: none

## 2015-06-10 NOTE — Assessment & Plan Note (Signed)
Etiology of the back pain. Continue Fentanyl for pain control. Activity as tolerated.

## 2015-06-14 ENCOUNTER — Encounter: Payer: Self-pay | Admitting: Internal Medicine

## 2015-06-14 DIAGNOSIS — R296 Repeated falls: Secondary | ICD-10-CM | POA: Diagnosis not present

## 2015-06-14 DIAGNOSIS — R413 Other amnesia: Secondary | ICD-10-CM | POA: Diagnosis not present

## 2015-06-14 DIAGNOSIS — Z96653 Presence of artificial knee joint, bilateral: Secondary | ICD-10-CM | POA: Diagnosis not present

## 2015-06-14 DIAGNOSIS — Z950 Presence of cardiac pacemaker: Secondary | ICD-10-CM | POA: Diagnosis not present

## 2015-06-14 DIAGNOSIS — R262 Difficulty in walking, not elsewhere classified: Secondary | ICD-10-CM | POA: Diagnosis not present

## 2015-06-14 DIAGNOSIS — M6281 Muscle weakness (generalized): Secondary | ICD-10-CM | POA: Diagnosis not present

## 2015-06-14 DIAGNOSIS — M25462 Effusion, left knee: Secondary | ICD-10-CM | POA: Diagnosis not present

## 2015-06-14 DIAGNOSIS — R55 Syncope and collapse: Secondary | ICD-10-CM | POA: Diagnosis not present

## 2015-06-20 ENCOUNTER — Other Ambulatory Visit: Payer: Self-pay

## 2015-06-21 ENCOUNTER — Encounter: Payer: Self-pay | Admitting: Internal Medicine

## 2015-06-28 ENCOUNTER — Ambulatory Visit: Payer: Medicare Other | Admitting: Pulmonary Disease

## 2015-07-15 ENCOUNTER — Non-Acute Institutional Stay (SKILLED_NURSING_FACILITY): Payer: Medicare Other | Admitting: Adult Health

## 2015-07-15 DIAGNOSIS — R627 Adult failure to thrive: Secondary | ICD-10-CM | POA: Diagnosis not present

## 2015-07-15 DIAGNOSIS — I5042 Chronic combined systolic (congestive) and diastolic (congestive) heart failure: Secondary | ICD-10-CM | POA: Diagnosis not present

## 2015-07-18 ENCOUNTER — Encounter: Payer: Self-pay | Admitting: Adult Health

## 2015-07-18 NOTE — Progress Notes (Signed)
Patient ID: Zachary Harris, male   DOB: [birth year], 81 y.o.   MRN: [id number]    Facility: [facility] [place]      Allergies  Allergen Reactions  . Hydrocodone     hallucination  . Tramadol     hallucination    Chief Complaint  Patient presents with  . Acute Visit    family concerns    HPI:  His daughter is concerned about his status. We discussed his status. I reviewed his status with his daughter. We discussed his medical treatment; his failure to thrive and his overall status. He tells me that he is feeling good and is not voicing any concerns. He continues to be followed by hospice care. Per dietary he has lost 5 pounds over the past month.    Past Medical History  Diagnosis Date  . ANEMIA-IRON DEFICIENCY 05/27/2007  . ANXIETY 11/14/2007  . Intermittent high-grade heart block 09/05/2009    s/p pacemaker  . BENIGN PROSTATIC HYPERTROPHY 11/14/2007  . CAROTID ARTERY DISEASE 10/02/2010  . Pacemaker-St. Jude 02/02/2008    St. Jude  . COLONIC POLYPS, HX OF 05/27/2007  . CORONARY ARTERY DISEASE 11/14/2007    prior bypass [phone]; Inferior MI 11/07; Severe 3 vessel disease; occluded SVG to RCA; SVG to OM subtotalled, SVG to diagonal with ostial 70 and 80 mid; EF 45. PCI of SVG to OM. FU myovue showed EF 56 with inferior infarct and very mild peri-infarct ischemia  . DEPRESSION 11/14/2007  . DIABETES MELLITUS, TYPE II 11/14/2007  . DIVERTICULITIS, HX OF 05/27/2007  . DVT (deep venous thrombosis) 03/21/2011  . FOOT PAIN, BILATERAL 12/08/2010  . GERD 11/14/2007  . HYPERLIPIDEMIA 11/14/2007  . HYPERTENSION 11/14/2007  . HYPOTENSION, ORTHOSTATIC 03/06/2011  . Memory loss 06/01/2008  . OSTEOPOROSIS 06/02/2010  . PERIPHERAL VASCULAR DISEASE 11/14/2007  . RENAL INSUFFICIENCY 11/14/2007  . SECONDARY DM W/PERIPHERAL CIRC D/O UNCONTROLLED 11/07/2010  . SYNCOPE 09/08/2008  . Unspecified hearing loss 05/13/2009  . Prostate cancer   . Myalgia and myositis, unspecified 04/24/2011  . Other  malaise and fatigue 04/24/2011  . Peripheral neuropathy 03/27/2011  . Seborrheic keratosis 06/05/2011  . Osteoarthrosis, unspecified whether generalized or localized, unspecified site   . Ischemic cardiomyopathy     a. echo (10/15):  mod focal basal septal hypertrophy, EF 40-45%, inf AK, Gr 1 DD, Al sclerosis, mild AI, MAC, mild MR, mod LAE, lipomatous hypertrophy, small effusion (no hemodynamic compromise)   . Effusion of left knee 03/17/2015  . Severe back pain   . DM (diabetes mellitus), type 2 with renal complications 8/52/7782  . Failure to thrive in adult 05/03/2015  . Chronic combined systolic and diastolic CHF (congestive heart failure) 10/10/2014  . CKD stage 3 due to type 1 diabetes mellitus 03/17/2015  . Dyspnea 11/09/2014  . Hearing loss 05/13/2009    Qualifier: Diagnosis of  By: Zachary Reichmann MD, Zachary Harris   . Hyperlipemia 11/14/2007    Qualifier: Diagnosis of  By: Zachary Reichmann MD, Zachary Harris    . Generalized weakness 05/20/2015  . Protein-calorie malnutrition, severe 05/23/2015  . Vertebral compression fracture 09/05/2009    Qualifier: Diagnosis of  By: Zachary Reichmann MD, Zachary Harris   . [name] 04/04/2015  . Weight loss 10/10/2014    Past Surgical History  Procedure Laterality Date  . S/p coronary stent x 1    . Cholecystectomy    . S/p bilat knee replacement  Zachary Gustin, MD  . Pacemaker insertion  2012    Zachary Comes,  MD  . Prostate needle biopsy    . Cystoscopy    . Appendectomy    . Coronary artery bypass graft  2007    VITAL SIGNS BP 119/70 mmHg  Pulse 68  Ht 5' 9" (1.753 m)  Wt 154 lb (69.854 kg)  BMI 22.73 kg/m2  Patient's Medications  New Prescriptions   No medications on file  Previous Medications   ACETAMINOPHEN (TYLENOL) 500 MG TABLET    Take 2 tablets (1,000 mg total) by mouth 2 (two) times daily.   FEEDING SUPPLEMENT, GLUCERNA SHAKE, (GLUCERNA SHAKE) LIQD    Take 237 mLs by mouth 2 (two) times daily between meals.   FENTANYL (DURAGESIC) 25 MCG/HR PATCH    Apply fresh patch every 3 days to help  pain. Remove old patch.   LACTULOSE (CHRONULAC) 10 GM/15ML SOLUTION    Take 20 g by mouth every other day. Every other day as needed   NITROGLYCERIN (NITROSTAT) 0.4 MG SL TABLET    Place 0.4 mg under the tongue every 5 (five) minutes as needed for chest pain.  Modified Medications   No medications on file  Discontinued Medications   DOCUSATE SODIUM (COLACE) 100 MG CAPSULE    Take 1 capsule (100 mg total) by mouth every 12 (twelve) hours.     SIGNIFICANT DIAGNOSTIC EXAMS  LABS REVIEWED:   06-06-15; wbc 8.7 ;hgb 12.0; hct 36.7; mcv 91.1; plt 264; glucose 137; bun 32; creat 0.95; k+4.4; na++136; alk phos 162; albumin 2.7 hgb a1c 6.7; tsh 3.159 06-10-15: urine culture: enterococcus: macrobid    Review of Systems  Constitutional: Negative for appetite change and fatigue.  HENT: Negative for congestion.   Respiratory: Negative for cough, chest tightness and shortness of breath.   Cardiovascular: Negative for chest pain, palpitations and leg swelling.  Gastrointestinal: Negative for nausea, abdominal pain, diarrhea and constipation.  Musculoskeletal: Negative for myalgias and arthralgias.  Skin: Negative for pallor.  Neurological: Negative for dizziness.  Psychiatric/Behavioral: The patient is not nervous/anxious.       Physical Exam  Constitutional: No distress.  Thin   Eyes: Conjunctivae are normal.  Neck: Neck supple. No JVD present. No thyromegaly present.  Cardiovascular: Normal rate, regular rhythm and intact distal pulses.   Respiratory: Effort normal and breath sounds normal. No respiratory distress. He has no wheezes.  GI: Soft. Bowel sounds are normal. He exhibits no distension. There is no tenderness.  Musculoskeletal: He exhibits no edema.  Able to move all extremities   Lymphadenopathy:    He has no cervical adenopathy.  Neurological: He is alert.  Skin: Skin is warm and dry. He is not diaphoretic.  Psychiatric: He has a normal mood and affect.     ASSESSMENT/  PLAN:  1. FTT 2. Chronic combine systolic and diastolic heart failure  Will weigh him weekly Will begin prostat 30 cc twice daily Will check cbc; cmp in Sept. The focus of care is comfort care   Time spent with patient  40  minutes >50% time spent counseling; reviewing medical record; tests; labs; and developing future plan of care    Ok Edwards NP [facility]  Contact [phone] Monday through Friday 8am- 5pm  After hours call [phone]

## 2015-07-19 ENCOUNTER — Encounter: Payer: Self-pay | Admitting: Internal Medicine

## 2015-07-19 ENCOUNTER — Ambulatory Visit (INDEPENDENT_AMBULATORY_CARE_PROVIDER_SITE_OTHER): Admitting: *Deleted

## 2015-07-19 DIAGNOSIS — Z950 Presence of cardiac pacemaker: Secondary | ICD-10-CM

## 2015-07-19 DIAGNOSIS — I441 Atrioventricular block, second degree: Secondary | ICD-10-CM

## 2015-07-19 DIAGNOSIS — I480 Paroxysmal atrial fibrillation: Secondary | ICD-10-CM

## 2015-08-01 LAB — CUP PACEART REMOTE DEVICE CHECK
Battery Voltage: 2.96 V
Brady Statistic AP VP Percent: 1.1 %
Brady Statistic AP VS Percent: 2.3 %
Brady Statistic RA Percent Paced: 2.7 %
Brady Statistic RV Percent Paced: 26 %
Date Time Interrogation Session: 20160726071338
Lead Channel Impedance Value: 400 Ohm
Lead Channel Impedance Value: 550 Ohm
Lead Channel Pacing Threshold Amplitude: 0.75 V
Lead Channel Pacing Threshold Amplitude: 0.75 V
Lead Channel Pacing Threshold Pulse Width: 0.4 ms
Lead Channel Pacing Threshold Pulse Width: 0.4 ms
Lead Channel Sensing Intrinsic Amplitude: 10.5 mV
Lead Channel Setting Pacing Amplitude: 2 V
Lead Channel Setting Pacing Amplitude: 2.5 V
Lead Channel Setting Pacing Pulse Width: 0.4 ms
MDC IDC MSMT BATTERY REMAINING LONGEVITY: 127 mo
MDC IDC MSMT BATTERY REMAINING PERCENTAGE: 95.5 %
MDC IDC MSMT LEADCHNL RA SENSING INTR AMPL: 2.9 mV
MDC IDC SET LEADCHNL RV SENSING SENSITIVITY: 2 mV
MDC IDC STAT BRADY AS VP PERCENT: 24 %
MDC IDC STAT BRADY AS VS PERCENT: 71 %
Pulse Gen Serial Number: 7228315

## 2015-08-12 ENCOUNTER — Ambulatory Visit: Payer: Medicare Other | Admitting: Cardiology

## 2015-08-16 ENCOUNTER — Non-Acute Institutional Stay (SKILLED_NURSING_FACILITY): Payer: Medicare Other | Admitting: Nurse Practitioner

## 2015-08-16 ENCOUNTER — Encounter: Payer: Self-pay | Admitting: Nurse Practitioner

## 2015-08-16 DIAGNOSIS — M4850XS Collapsed vertebra, not elsewhere classified, site unspecified, sequela of fracture: Secondary | ICD-10-CM

## 2015-08-16 DIAGNOSIS — I2510 Atherosclerotic heart disease of native coronary artery without angina pectoris: Secondary | ICD-10-CM

## 2015-08-16 DIAGNOSIS — G629 Polyneuropathy, unspecified: Secondary | ICD-10-CM

## 2015-08-16 DIAGNOSIS — I480 Paroxysmal atrial fibrillation: Secondary | ICD-10-CM

## 2015-08-16 DIAGNOSIS — M5440 Lumbago with sciatica, unspecified side: Secondary | ICD-10-CM

## 2015-08-16 DIAGNOSIS — R5081 Fever presenting with conditions classified elsewhere: Secondary | ICD-10-CM

## 2015-08-16 DIAGNOSIS — N419 Inflammatory disease of prostate, unspecified: Secondary | ICD-10-CM | POA: Diagnosis not present

## 2015-08-16 DIAGNOSIS — E1142 Type 2 diabetes mellitus with diabetic polyneuropathy: Secondary | ICD-10-CM | POA: Diagnosis not present

## 2015-08-16 DIAGNOSIS — R413 Other amnesia: Secondary | ICD-10-CM

## 2015-08-16 DIAGNOSIS — IMO0001 Reserved for inherently not codable concepts without codable children: Secondary | ICD-10-CM

## 2015-08-16 DIAGNOSIS — B372 Candidiasis of skin and nail: Secondary | ICD-10-CM

## 2015-08-16 DIAGNOSIS — K5900 Constipation, unspecified: Secondary | ICD-10-CM | POA: Diagnosis not present

## 2015-08-16 DIAGNOSIS — I5042 Chronic combined systolic (congestive) and diastolic (congestive) heart failure: Secondary | ICD-10-CM

## 2015-08-16 DIAGNOSIS — I2583 Coronary atherosclerosis due to lipid rich plaque: Secondary | ICD-10-CM

## 2015-08-16 DIAGNOSIS — I10 Essential (primary) hypertension: Secondary | ICD-10-CM | POA: Diagnosis not present

## 2015-08-16 DIAGNOSIS — R509 Fever, unspecified: Secondary | ICD-10-CM | POA: Diagnosis not present

## 2015-08-16 NOTE — Assessment & Plan Note (Signed)
Rate is in control w/o medications.

## 2015-08-16 NOTE — Assessment & Plan Note (Signed)
Blood pressure is controlled. off Losartan 25mg 

## 2015-08-16 NOTE — Assessment & Plan Note (Signed)
Etiology of the back pain. Continue Fentanyl and Tylenol for pain control. Activity as tolerated.

## 2015-08-16 NOTE — Assessment & Plan Note (Signed)
06/10/15 Lactulose prn.

## 2015-08-16 NOTE — Assessment & Plan Note (Addendum)
Entire R+L buttocks and sacral area, pressure reduction, meticulous personal hygiene, avoid moist. Nystatin powder

## 2015-08-16 NOTE — Assessment & Plan Note (Signed)
No angina since admitted to SNF. Prn NTG, statin, and off Xarelto

## 2015-08-16 NOTE — Assessment & Plan Note (Signed)
Obtain CBC, CMP, UA C/S CXR in am. Observe the patient.

## 2015-08-16 NOTE — Assessment & Plan Note (Signed)
Progressing. off Namenda R>B. SNF for care needs. Comfort measures.

## 2015-08-16 NOTE — Assessment & Plan Note (Signed)
O2 dependent, DOE apparent, activity as tolerated, Hospice service and comfort care measures.

## 2015-08-16 NOTE — Progress Notes (Signed)
Patient ID: Zachary Harris, male   DOB: January 11, 1923, 81 y.o.   MRN: [id number]  Location:  SNF [facility] Provider:  Marlana Latus NP  Code Status:  DNR Goals of care: Advanced Directive information    Chief Complaint  Patient presents with  . Medical Management of Chronic Issues  . Acute Visit    fever     HPI: Patient is a 81 y.o. male seen in the SNF at [facility] today for evaluation of  Fever, 100.6, no chills, appetite reportedly poor, takes Tylenol 500mg  bid and Fentanyl for back pain, Hx of UTI. Denied nausea, vomiting, diarrhea, cough, sputum production, chest pain. He has a nonhealing sacral pressure wound, no s/s of infection.   Review of Systems:  Review of Systems  Constitutional: Negative for fever and chills.  HENT: Positive for hearing loss. Negative for ear pain.   Eyes: Negative for photophobia and pain.       Corrective lenses  Respiratory: Positive for shortness of breath. Negative for cough and wheezing.   Cardiovascular: Negative for leg swelling.       Pacemaker. History of syncope related to cardiac arrhythmia.  Gastrointestinal: Negative for abdominal pain.  Genitourinary: Positive for urgency and frequency. Negative for dysuria and flank pain.       Underwent Urology evaluation in the past, off meds. Episodes of incontinence persist.  Musculoskeletal: Positive for myalgias, back pain and neck pain (Fall on 03/15/15 causing pain in the left knee and effusion.).       Poor balance and gait disturbance. Lower back pain  Skin:       Rough area on left cheek R+L buttocks excoriated w/o open area   Neurological: Negative for tremors, seizures and weakness.       Memory loss  Endo/Heme/Allergies: Negative for polydipsia.  Psychiatric/Behavioral:       Mild to moderate memory disturbance.    Past Medical History  Diagnosis Date  . ANEMIA-IRON DEFICIENCY 05/27/2007  . ANXIETY 11/14/2007  . Intermittent high-grade heart block 09/05/2009    s/p pacemaker  .  BENIGN PROSTATIC HYPERTROPHY 11/14/2007  . CAROTID ARTERY DISEASE 10/02/2010  . Pacemaker-St. Jude 02/02/2008    St. Jude  . COLONIC POLYPS, HX OF 05/27/2007  . CORONARY ARTERY DISEASE 11/14/2007    prior bypass [id number]; Inferior MI 11/07; Severe 3 vessel disease; occluded SVG to RCA; SVG to OM subtotalled, SVG to diagonal with ostial 70 and 80 mid; EF 45. PCI of SVG to OM. FU myovue showed EF 56 with inferior infarct and very mild peri-infarct ischemia  . DEPRESSION 11/14/2007  . DIABETES MELLITUS, TYPE II 11/14/2007  . DIVERTICULITIS, HX OF 05/27/2007  . DVT (deep venous thrombosis) 03/21/2011  . FOOT PAIN, BILATERAL 12/08/2010  . GERD 11/14/2007  . HYPERLIPIDEMIA 11/14/2007  . HYPERTENSION 11/14/2007  . HYPOTENSION, ORTHOSTATIC 03/06/2011  . Memory loss 06/01/2008  . OSTEOPOROSIS 06/02/2010  . PERIPHERAL VASCULAR DISEASE 11/14/2007  . RENAL INSUFFICIENCY 11/14/2007  . SECONDARY DM W/PERIPHERAL CIRC D/O UNCONTROLLED 11/07/2010  . SYNCOPE 09/08/2008  . Unspecified hearing loss 05/13/2009  . Prostate cancer   . Myalgia and myositis, unspecified 04/24/2011  . Other malaise and fatigue 04/24/2011  . Peripheral neuropathy 03/27/2011  . Seborrheic keratosis 06/05/2011  . Osteoarthrosis, unspecified whether generalized or localized, unspecified site   . Ischemic cardiomyopathy     a. echo (10/15):  mod focal basal septal hypertrophy, EF 40-45%, inf AK, Gr 1 DD, Al sclerosis, mild AI, MAC, mild MR, mod LAE,  lipomatous hypertrophy, small effusion (no hemodynamic compromise)   . Effusion of left knee 03/17/2015  . Severe back pain   . DM (diabetes mellitus), type 2 with renal complications 04/28/3975  . Failure to thrive in adult 05/03/2015  . Chronic combined systolic and diastolic CHF (congestive heart failure) 10/10/2014  . CKD stage 3 due to type 1 diabetes mellitus 03/17/2015  . Dyspnea 11/09/2014  . Hearing loss 05/13/2009    Qualifier: Diagnosis of  By: Jenny Reichmann MD, Hunt Oris   . Hyperlipemia 11/14/2007      Qualifier: Diagnosis of  By: Jenny Reichmann MD, Hunt Oris    . Generalized weakness 05/20/2015  . Protein-calorie malnutrition, severe 05/23/2015  . Vertebral compression fracture 09/05/2009    Qualifier: Diagnosis of  By: Jenny Reichmann MD, Hunt Oris   . [name] 04/04/2015  . Weight loss 10/10/2014    Patient Active Problem List   Diagnosis Date Noted  . Fever presenting with conditions classified elsewhere 08/16/2015  . Candidiasis of skin 06/07/2015  . Constipation 05/27/2015  . Protein-calorie malnutrition, severe 05/23/2015  . Palliative care encounter   . Compression fracture 05/20/2015  . Generalized weakness 05/20/2015  . Dehydration 05/20/2015  . DM (diabetes mellitus), type 2 with renal complications 73/41/9379  . Failure to thrive in adult 05/03/2015  . Weak 04/04/2015  . CKD stage 3 due to type 1 diabetes mellitus 03/17/2015  . Chronic respiratory failure 01/13/2015  . Dyspnea 11/09/2014  . Paroxysmal atrial fibrillation 10/25/2014  . Ischemic cardiomyopathy 10/10/2014  . Chronic combined systolic and diastolic CHF (congestive heart failure) 10/10/2014  . Weight loss 10/10/2014  . Other malaise and fatigue 08/16/2013  . Infection of urinary tract 08/16/2013  . Urine retention 08/16/2013  . Urge incontinence 08/16/2013  . Nocturia 08/16/2013  . Nodular prostate with urinary obstruction 08/16/2013  . DM type 2 with diabetic peripheral neuropathy 04/28/2013  . Malignant neoplasm of prostate 02/25/2013  . HYPOTENSION, ORTHOSTATIC 03/06/2011  . Osteoporosis 06/02/2010  . Backache 09/05/2009  . Vertebral compression fracture 09/05/2009  . Intermittent high-grade heart block 09/05/2009  . Hearing loss 05/13/2009  . Memory loss 06/01/2008  . Pacemaker-St.Jude 02/02/2008  . Hyperlipemia 11/14/2007  . ANXIETY 11/14/2007  . Depression 11/14/2007  . Essential hypertension 11/14/2007  . MYOCARDIAL INFARCTION, HX OF 11/14/2007  . CAD (coronary artery disease) 11/14/2007  . Peripheral vascular  disease 11/14/2007  . GERD 11/14/2007  . Iron deficiency anemia 05/27/2007  . COLONIC POLYPS, HX OF 05/27/2007    Allergies  Allergen Reactions  . Hydrocodone     hallucination  . Tramadol     hallucination    Medications: Patient's Medications  New Prescriptions   No medications on file  Previous Medications   ACETAMINOPHEN (TYLENOL) 500 MG TABLET    Take 2 tablets (1,000 mg total) by mouth 2 (two) times daily.   FEEDING SUPPLEMENT, GLUCERNA SHAKE, (GLUCERNA SHAKE) LIQD    Take 237 mLs by mouth 2 (two) times daily between meals.   FENTANYL (DURAGESIC) 25 MCG/HR PATCH    Apply fresh patch every 3 days to help pain. Remove old patch.   LACTULOSE (CHRONULAC) 10 GM/15ML SOLUTION    Take 20 g by mouth every other day. Every other day as needed   NITROGLYCERIN (NITROSTAT) 0.4 MG SL TABLET    Place 0.4 mg under the tongue every 5 (five) minutes as needed for chest pain.  Modified Medications   No medications on file  Discontinued Medications   No medications on file  Physical Exam: Filed Vitals:   08/16/15 1330  BP: 138/60  Pulse: 70  Temp: 98.1 F (36.7 C)  TempSrc: Tympanic  Resp: 18   There is no weight on file to calculate BMI.  Physical Exam  Constitutional: He is oriented to person, place, and time.  Frail  HENT:  Head: Normocephalic and atraumatic.  Nose: Nose normal.  Bilateral hearing aids.  Eyes: Conjunctivae are normal. Pupils are equal, round, and reactive to light.  Prescription lenses.  Neck: Normal range of motion. Neck supple. No JVD present. No tracheal deviation present. No thyromegaly present.  Cardiovascular: Normal rate and regular rhythm.  Exam reveals no gallop and no friction rub.   Murmur heard.  Systolic murmur is present with a grade of 1/6  Absent DP and PT  Pulmonary/Chest: Effort normal and breath sounds normal. No respiratory distress. He has no wheezes. He has no rales. He exhibits no tenderness.  Using portable oxygen at 2 L/m  today.  Abdominal: Soft. Bowel sounds are normal. He exhibits no distension and no mass. There is no tenderness.  Musculoskeletal: Normal range of motion. He exhibits no edema or tenderness.  Pain in the left SI joint area. Limps when walking. Favors the left side. Effusion of the left knee. Scars in the left knee from previous surgery. Tender at the left knee with movement of the patella. Positional lower back pain  Lymphadenopathy:    He has no cervical adenopathy.  Neurological: He is alert and oriented to person, place, and time. He has normal strength. A sensory deficit is present. No cranial nerve deficit. Gait abnormal.  07/13/14 MMSE 28/30. Passed clock drawing. Diminshed sensation to vabration and monofilament.  Skin: Skin is warm and dry. No erythema. No pallor.  Rough area on the left cheek. Cystic lesion in the mid forehead. Mid sternal surgical scar. Left upper chest pacemaker. Redness R+L buttocks and entire sacral area.     Psychiatric: He has a normal mood and affect. His behavior is normal. Thought content normal.  Patient is in denial regarding the extent of his memory deficit.    Labs reviewed: Basic Metabolic Panel:  Recent Labs  10/08/14 1603  05/20/15 1341 05/21/15 0523 05/22/15 0533 06/06/15  NA  --   < > 137 136 135 136*  K  --   < > 3.8 3.4* 4.4 4.4  CL  --   < > 104 104 107  --   CO2  --   < > 23 25 21*  --   GLUCOSE  --   < > 149* 137* 126*  --   BUN  --   < > 64* 57* 33* 32*  CREATININE 1.55*  < > 1.67* 1.47* 0.92 1.0  CALCIUM  --   < > 9.5 8.8* 8.6*  --   MG 2.0  --   --   --   --   --   < > = values in this interval not displayed.  Liver Function Tests:  Recent Labs  10/08/14 1021  05/03/15 1040 05/12/15 05/20/15 1341 06/06/15  AST 25  < > 21 24 35 27  ALT 11  < > 11 13 19 20   ALKPHOS 44  < > 66 86 122 162*  BILITOT 0.9  --  0.8  --  0.9  --   PROT 7.3  --  7.1  --  6.8  --   ALBUMIN 3.6  --   --   --  3.1*  --   < > =  values in this  interval not displayed.  CBC:  Recent Labs  05/03/15 1040 05/20/15 1341 05/21/15 0523 05/22/15 0533 06/06/15  WBC 9.6 8.3 6.3 5.4 8.7  NEUTROABS 6.8 5.9  --   --   --   HGB  --  13.1 11.5* 11.9* 12.0*  HCT 42.1 39.0 34.6* 35.3* 37*  MCV  --  90.9 90.1 90.1  --   PLT  --  182 147* 126* 264    Lab Results  Component Value Date   TSH 3.16 06/06/2015   Lab Results  Component Value Date   HGBA1C 6.7* 06/06/2015   Lab Results  Component Value Date   CHOL 132 02/07/2015   HDL 33* 02/07/2015   LDLCALC 76 02/07/2015   LDLDIRECT 85.6 07/19/2010   TRIG 115 02/07/2015   CHOLHDL 4 03/06/2011    Significant Diagnostic Results since last visit: none  Patient Care Team: Estill Dooms, MD as PCP - General (Internal Medicine) Irine Seal, MD as Consulting Physician (Urology) Renella Cunas, MD as Consulting Physician (Cardiology) [facility] Lelon Perla, MD as Consulting Physician (Cardiology) [place] Samford X, NP as Nurse Practitioner (Nurse Practitioner) Rigoberto Noel, MD as Consulting Physician (Pulmonary Disease)  Assessment/Plan Problem List Items Addressed This Visit    Essential hypertension - Primary (Chronic)    Blood pressure is controlled. off Losartan 25mg        CAD (coronary artery disease) (Chronic)    No angina since admitted to SNF. Prn NTG, statin, and off Xarelto        Backache (Chronic)    Etiology of the back pain. Continue Fentanyl and Tylenol for pain control. Activity as tolerated.         Memory loss (Chronic)    Progressing. off Namenda R>B. SNF for care needs. Comfort measures.          DM type 2 with diabetic peripheral neuropathy (Chronic)    06/06/15 Hgb A1c 6.7 diet control.        Chronic combined systolic and diastolic CHF (congestive heart failure) (Chronic)    O2 dependent, DOE apparent, activity as tolerated, Hospice service and comfort care measures.          Vertebral compression fracture    Etiology of the  back pain. Continue Fentanyl and Tylenol for pain control. Activity as tolerated.         Paroxysmal atrial fibrillation    Rate is in control w/o medications.          Constipation    06/10/15 Lactulose prn.       Candidiasis of skin    Entire R+L buttocks and sacral area, pressure reduction, meticulous personal hygiene, avoid moist. Nystatin powder       Fever presenting with conditions classified elsewhere    Obtain CBC, CMP, UA C/S CXR in am. Observe the patient.           Family/ staff Communication: monitor T and s/s of infection.   Labs/tests ordered:  CBC, CMP, UA C/S, CXR  [facility] Samanta Gal NP [facility] [address], [place] [address] On Call:  [phone] & follow prompts after 5pm & weekends Office Phone:  [phone] Office Fax:  [phone]

## 2015-08-16 NOTE — Assessment & Plan Note (Signed)
06/06/15 Hgb A1c 6.7 diet control.

## 2015-08-17 ENCOUNTER — Other Ambulatory Visit: Payer: Self-pay

## 2015-08-17 DIAGNOSIS — M545 Low back pain, unspecified: Secondary | ICD-10-CM

## 2015-08-17 DIAGNOSIS — E876 Hypokalemia: Secondary | ICD-10-CM | POA: Diagnosis not present

## 2015-08-17 DIAGNOSIS — R262 Difficulty in walking, not elsewhere classified: Secondary | ICD-10-CM | POA: Diagnosis not present

## 2015-08-17 DIAGNOSIS — M4850XD Collapsed vertebra, not elsewhere classified, site unspecified, subsequent encounter for fracture with routine healing: Secondary | ICD-10-CM

## 2015-08-17 DIAGNOSIS — D649 Anemia, unspecified: Secondary | ICD-10-CM | POA: Diagnosis not present

## 2015-08-17 DIAGNOSIS — IMO0001 Reserved for inherently not codable concepts without codable children: Secondary | ICD-10-CM

## 2015-08-17 LAB — BASIC METABOLIC PANEL
BUN: 37 mg/dL — AB (ref 4–21)
Creatinine: 0.7 mg/dL (ref 0.6–1.3)
Glucose: 148 mg/dL
Potassium: 4 mmol/L (ref 3.4–5.3)
SODIUM: 136 mmol/L — AB (ref 137–147)

## 2015-08-17 LAB — CBC AND DIFFERENTIAL
HCT: 34 % — AB (ref 41–53)
Hemoglobin: 11.3 g/dL — AB (ref 13.5–17.5)
Platelets: 144 10*3/uL — AB (ref 150–399)
WBC: 10.5 10*3/mL

## 2015-08-17 LAB — HEPATIC FUNCTION PANEL
ALT: 16 U/L (ref 10–40)
AST: 25 U/L (ref 14–40)
Alkaline Phosphatase: 156 U/L — AB (ref 25–125)
Bilirubin, Total: 0.9 mg/dL

## 2015-08-17 MED ORDER — FENTANYL 25 MCG/HR TD PT72: MEDICATED_PATCH | TRANSDERMAL | Status: DC

## 2015-08-17 NOTE — Telephone Encounter (Signed)
RX sent to [facility] @ [phone], phone number [phone]. This is a nursing home refill request.   

## 2015-08-19 ENCOUNTER — Other Ambulatory Visit: Payer: Self-pay | Admitting: Nurse Practitioner

## 2015-08-19 DIAGNOSIS — I5042 Chronic combined systolic (congestive) and diastolic (congestive) heart failure: Secondary | ICD-10-CM

## 2015-08-30 ENCOUNTER — Other Ambulatory Visit: Payer: Self-pay | Admitting: Nurse Practitioner

## 2015-08-30 ENCOUNTER — Ambulatory Visit: Payer: Medicare Other | Admitting: Neurology

## 2015-08-30 DIAGNOSIS — E876 Hypokalemia: Secondary | ICD-10-CM | POA: Diagnosis not present

## 2015-08-30 DIAGNOSIS — D649 Anemia, unspecified: Secondary | ICD-10-CM | POA: Diagnosis not present

## 2015-08-30 LAB — BASIC METABOLIC PANEL
BUN: 25 mg/dL — AB (ref 4–21)
CREATININE: 0.7 mg/dL (ref 0.6–1.3)
Glucose: 115 mg/dL
Potassium: 4.4 mmol/L (ref 3.4–5.3)
SODIUM: 137 mmol/L (ref 137–147)

## 2015-08-30 LAB — HEPATIC FUNCTION PANEL
ALT: 8 U/L — AB (ref 10–40)
AST: 17 U/L (ref 14–40)
Alkaline Phosphatase: 190 U/L — AB (ref 25–125)
Bilirubin, Total: 0.7 mg/dL

## 2015-08-30 LAB — CBC AND DIFFERENTIAL
HCT: 34 % — AB (ref 41–53)
HEMOGLOBIN: 11.1 g/dL — AB (ref 13.5–17.5)
Platelets: 239 10*3/uL (ref 150–399)
WBC: 8.2 10^3/mL

## 2015-09-02 ENCOUNTER — Encounter: Payer: Self-pay | Admitting: Nurse Practitioner

## 2015-09-02 ENCOUNTER — Non-Acute Institutional Stay (SKILLED_NURSING_FACILITY): Payer: Medicare Other | Admitting: Nurse Practitioner

## 2015-09-02 DIAGNOSIS — M5440 Lumbago with sciatica, unspecified side: Secondary | ICD-10-CM | POA: Diagnosis not present

## 2015-09-02 DIAGNOSIS — R413 Other amnesia: Secondary | ICD-10-CM

## 2015-09-02 DIAGNOSIS — K5900 Constipation, unspecified: Secondary | ICD-10-CM

## 2015-09-02 DIAGNOSIS — G629 Polyneuropathy, unspecified: Secondary | ICD-10-CM

## 2015-09-02 DIAGNOSIS — E1142 Type 2 diabetes mellitus with diabetic polyneuropathy: Secondary | ICD-10-CM | POA: Diagnosis not present

## 2015-09-02 DIAGNOSIS — I10 Essential (primary) hypertension: Secondary | ICD-10-CM

## 2015-09-02 DIAGNOSIS — I5042 Chronic combined systolic (congestive) and diastolic (congestive) heart failure: Secondary | ICD-10-CM | POA: Diagnosis not present

## 2015-09-02 DIAGNOSIS — B372 Candidiasis of skin and nail: Secondary | ICD-10-CM

## 2015-09-02 NOTE — Assessment & Plan Note (Signed)
Persisted entire R+L buttocks, groins, sacral area, continue pressure reduction, meticulous personal hygiene, avoid moist, Nystatin powder, adding Diflucan 100mg  daily x 3 days.

## 2015-09-02 NOTE — Assessment & Plan Note (Signed)
Progressing. off Namenda R>B. SNF for care needs. Comfort measures.   

## 2015-09-02 NOTE — Assessment & Plan Note (Signed)
06/06/15 Hgb A1c 6.7 diet control.   

## 2015-09-02 NOTE — Progress Notes (Signed)
Patient ID: Zachary Harris, male   DOB: 09-Jun-1923, 81 y.o.   MRN: [id number]  Location:  SNF [facility] Provider:  Marlana Latus NP  Code Status:  DNR Goals of care: Advanced Directive information    Chief Complaint  Patient presents with  . Medical Management of Chronic Issues  . Acute Visit    excoriated buttocks     HPI: Patient is a 81 y.o. male seen in the SNF at [facility] today for evaluation of persisted excoriated buttocks and groins due to pressure and moist, Nystatin powder didn't help.   Review of Systems:  Review of Systems  Constitutional: Negative for fever and chills.  HENT: Positive for hearing loss. Negative for ear pain.   Eyes: Negative for photophobia and pain.       Corrective lenses  Respiratory: Positive for shortness of breath. Negative for cough and wheezing.   Cardiovascular: Negative for leg swelling.       Pacemaker. History of syncope related to cardiac arrhythmia.  Gastrointestinal: Negative for abdominal pain.  Genitourinary: Positive for urgency and frequency. Negative for dysuria and flank pain.       Underwent Urology evaluation in the past, off meds. Episodes of incontinence persist.  Musculoskeletal: Positive for myalgias, back pain and neck pain (Fall on 03/15/15 causing pain in the left knee and effusion.).       Poor balance and gait disturbance. Lower back pain Sever kyphosis  Skin:       Rough area on left cheek R+L buttocks, groins, sacrum excoriated, small open area left buttock  Neurological: Negative for tremors, seizures and weakness.       Memory loss  Endo/Heme/Allergies: Negative for polydipsia.  Psychiatric/Behavioral:       Mild to moderate memory disturbance.    Past Medical History  Diagnosis Date  . ANEMIA-IRON DEFICIENCY 05/27/2007  . ANXIETY 11/14/2007  . Intermittent high-grade heart block 09/05/2009    s/p pacemaker  . BENIGN PROSTATIC HYPERTROPHY 11/14/2007  . CAROTID ARTERY DISEASE 10/02/2010  . Pacemaker-[employer]  Jude 02/02/2008    [employer]. Jude  . COLONIC POLYPS, HX OF 05/27/2007  . CORONARY ARTERY DISEASE 11/14/2007    prior bypass [phone]; Inferior MI 11/07; Severe 3 vessel disease; occluded SVG to RCA; SVG to OM subtotalled, SVG to diagonal with ostial 70 and 80 mid; EF 45. PCI of SVG to OM. FU myovue showed EF 56 with inferior infarct and very mild peri-infarct ischemia  . DEPRESSION 11/14/2007  . DIABETES MELLITUS, TYPE II 11/14/2007  . DIVERTICULITIS, HX OF 05/27/2007  . DVT (deep venous thrombosis) 03/21/2011  . FOOT PAIN, BILATERAL 12/08/2010  . GERD 11/14/2007  . HYPERLIPIDEMIA 11/14/2007  . HYPERTENSION 11/14/2007  . HYPOTENSION, ORTHOSTATIC 03/06/2011  . Memory loss 06/01/2008  . OSTEOPOROSIS 06/02/2010  . PERIPHERAL VASCULAR DISEASE 11/14/2007  . RENAL INSUFFICIENCY 11/14/2007  . SECONDARY DM W/PERIPHERAL CIRC D/O UNCONTROLLED 11/07/2010  . SYNCOPE 09/08/2008  . Unspecified hearing loss 05/13/2009  . Prostate cancer   . Myalgia and myositis, unspecified 04/24/2011  . Other malaise and fatigue 04/24/2011  . Peripheral neuropathy 03/27/2011  . Seborrheic keratosis 06/05/2011  . Osteoarthrosis, unspecified whether generalized or localized, unspecified site   . Ischemic cardiomyopathy     a. echo (10/15):  mod focal basal septal hypertrophy, EF 40-45%, inf AK, Gr 1 DD, Al sclerosis, mild AI, MAC, mild MR, mod LAE, lipomatous hypertrophy, small effusion (no hemodynamic compromise)   . Effusion of left knee 03/17/2015  . Severe back pain   .  DM (diabetes mellitus), type 2 with renal complications 9/47/0962  . Failure to thrive in adult 05/03/2015  . Chronic combined systolic and diastolic CHF (congestive heart failure) 10/10/2014  . CKD stage 3 due to type 1 diabetes mellitus 03/17/2015  . Dyspnea 11/09/2014  . Hearing loss 05/13/2009    Qualifier: Diagnosis of  By: Jenny Reichmann MD, Hunt Oris   . Hyperlipemia 11/14/2007    Qualifier: Diagnosis of  By: Jenny Reichmann MD, Hunt Oris    . Generalized weakness 05/20/2015  .  Protein-calorie malnutrition, severe 05/23/2015  . Vertebral compression fracture 09/05/2009    Qualifier: Diagnosis of  By: Jenny Reichmann MD, Hunt Oris   . [name] 04/04/2015  . Weight loss 10/10/2014    Patient Active Problem List   Diagnosis Date Noted  . Fever presenting with conditions classified elsewhere 08/16/2015  . Candidiasis of skin 06/07/2015  . Constipation 05/27/2015  . Protein-calorie malnutrition, severe 05/23/2015  . Palliative care encounter   . Compression fracture 05/20/2015  . Generalized weakness 05/20/2015  . Dehydration 05/20/2015  . DM (diabetes mellitus), type 2 with renal complications 83/66/2947  . Failure to thrive in adult 05/03/2015  . Weak 04/04/2015  . CKD stage 3 due to type 1 diabetes mellitus 03/17/2015  . Chronic respiratory failure 01/13/2015  . Dyspnea 11/09/2014  . Paroxysmal atrial fibrillation 10/25/2014  . Ischemic cardiomyopathy 10/10/2014  . Chronic combined systolic and diastolic CHF (congestive heart failure) 10/10/2014  . Weight loss 10/10/2014  . Other malaise and fatigue 08/16/2013  . Infection of urinary tract 08/16/2013  . Urine retention 08/16/2013  . Urge incontinence 08/16/2013  . Nocturia 08/16/2013  . Nodular prostate with urinary obstruction 08/16/2013  . DM type 2 with diabetic peripheral neuropathy 04/28/2013  . Malignant neoplasm of prostate 02/25/2013  . HYPOTENSION, ORTHOSTATIC 03/06/2011  . Osteoporosis 06/02/2010  . Backache 09/05/2009  . Vertebral compression fracture 09/05/2009  . Intermittent high-grade heart block 09/05/2009  . Hearing loss 05/13/2009  . Memory loss 06/01/2008  . Pacemaker-St.Jude 02/02/2008  . Hyperlipemia 11/14/2007  . ANXIETY 11/14/2007  . Depression 11/14/2007  . Essential hypertension 11/14/2007  . MYOCARDIAL INFARCTION, HX OF 11/14/2007  . CAD (coronary artery disease) 11/14/2007  . Peripheral vascular disease 11/14/2007  . GERD 11/14/2007  . Iron deficiency anemia 05/27/2007  . COLONIC  POLYPS, HX OF 05/27/2007    Allergies  Allergen Reactions  . Hydrocodone     hallucination  . Tramadol     hallucination    Medications: Patient's Medications  New Prescriptions   No medications on file  Previous Medications   ACETAMINOPHEN (TYLENOL) 500 MG TABLET    Take 2 tablets (1,000 mg total) by mouth 2 (two) times daily.   FEEDING SUPPLEMENT, GLUCERNA SHAKE, (GLUCERNA SHAKE) LIQD    Take 237 mLs by mouth 2 (two) times daily between meals.   FENTANYL (DURAGESIC) 25 MCG/HR PATCH    Apply fresh patch every 3 days to help pain. Remove old patch.   LACTULOSE (CHRONULAC) 10 GM/15ML SOLUTION    Take 20 g by mouth every other day. Every other day as needed   NITROGLYCERIN (NITROSTAT) 0.4 MG SL TABLET    Place 0.4 mg under the tongue every 5 (five) minutes as needed for chest pain.  Modified Medications   No medications on file  Discontinued Medications   No medications on file    Physical Exam: Filed Vitals:   09/02/15 1103  BP: 133/73  Pulse: 93  Temp: 97.3 F (36.3 C)  TempSrc: Tympanic  Resp: 20   There is no weight on file to calculate BMI.  Physical Exam  Constitutional: He is oriented to person, place, and time.  Frail  HENT:  Head: Normocephalic and atraumatic.  Nose: Nose normal.  Bilateral hearing aids.  Eyes: Conjunctivae are normal. Pupils are equal, round, and reactive to light.  Prescription lenses.  Neck: Normal range of motion. Neck supple. No JVD present. No tracheal deviation present. No thyromegaly present.  Cardiovascular: Normal rate and regular rhythm.  Exam reveals no gallop and no friction rub.   Murmur heard.  Systolic murmur is present with a grade of 1/6  Absent DP and PT  Pulmonary/Chest: Effort normal and breath sounds normal. No respiratory distress. He has no wheezes. He has no rales. He exhibits no tenderness.  Using portable oxygen at 2 L/m today.  Abdominal: Soft. Bowel sounds are normal. He exhibits no distension and no mass.  There is no tenderness.  Musculoskeletal: Normal range of motion. He exhibits no edema or tenderness.  Pain in the left SI joint area. Limps when walking. Favors the left side. Effusion of the left knee. Scars in the left knee from previous surgery. Tender at the left knee with movement of the patella. Positional lower back pain Server kyphosis  Lymphadenopathy:    He has no cervical adenopathy.  Neurological: He is alert and oriented to person, place, and time. He has normal strength. A sensory deficit is present. No cranial nerve deficit. Gait abnormal.  07/13/14 MMSE 28/30. Passed clock drawing. Diminshed sensation to vabration and monofilament.  Skin: Skin is warm and dry. No erythema. No pallor.  Rough area on the left cheek. Cystic lesion in the mid forehead. Mid sternal surgical scar. Left upper chest pacemaker. Redness R+L buttocks, groins, and entire sacral area.     Psychiatric: He has a normal mood and affect. His behavior is normal. Thought content normal.  Patient is in denial regarding the extent of his memory deficit.    Labs reviewed: Basic Metabolic Panel:  Recent Labs  10/08/14 1603  05/20/15 1341 05/21/15 0523 05/22/15 0533 06/06/15 08/17/15 08/30/15  NA  --   < > 137 136 135 136* 136* 137  K  --   < > 3.8 3.4* 4.4 4.4 4.0 4.4  CL  --   < > 104 104 107  --   --   --   CO2  --   < > 23 25 21*  --   --   --   GLUCOSE  --   < > 149* 137* 126*  --   --   --   BUN  --   < > 64* 57* 33* 32* 37* 25*  CREATININE 1.55*  < > 1.67* 1.47* 0.92 1.0 0.7 0.7  CALCIUM  --   < > 9.5 8.8* 8.6*  --   --   --   MG 2.0  --   --   --   --   --   --   --   < > = values in this interval not displayed.  Liver Function Tests:  Recent Labs  10/08/14 1021  05/03/15 1040  05/20/15 1341 06/06/15 08/17/15 08/30/15  AST 25  < > 21  < > 35 27 25 17   ALT 11  < > 11  < > 19 20 16  8*  ALKPHOS 44  < > 66  < > 122 162* 156* 190*  BILITOT 0.9  --  0.8  --  0.9  --   --   --   PROT 7.3  --   7.1  --  6.8  --   --   --   ALBUMIN 3.6  --   --   --  3.1*  --   --   --   < > = values in this interval not displayed.  CBC:  Recent Labs  05/03/15 1040 05/20/15 1341 05/21/15 0523 05/22/15 0533 06/06/15 08/17/15 08/30/15  WBC 9.6 8.3 6.3 5.4 8.7 10.5 8.2  NEUTROABS 6.8 5.9  --   --   --   --   --   HGB  --  13.1 11.5* 11.9* 12.0* 11.3* 11.1*  HCT 42.1 39.0 34.6* 35.3* 37* 34* 34*  MCV  --  90.9 90.1 90.1  --   --   --   PLT  --  182 147* 126* 264 144* 239    Lab Results  Component Value Date   TSH 3.16 06/06/2015   Lab Results  Component Value Date   HGBA1C 6.7* 06/06/2015   Lab Results  Component Value Date   CHOL 132 02/07/2015   HDL 33* 02/07/2015   LDLCALC 76 02/07/2015   LDLDIRECT 85.6 07/19/2010   TRIG 115 02/07/2015   CHOLHDL 4 03/06/2011    Significant Diagnostic Results since last visit: none  Patient Care Team: Estill Dooms, MD as PCP - General (Internal Medicine) Irine Seal, MD as Consulting Physician (Urology) Renella Cunas, MD as Consulting Physician (Cardiology) [facility] Lelon Perla, MD as Consulting Physician (Cardiology) Merrissa Giacobbe X, NP as Nurse Practitioner (Nurse Practitioner) Rigoberto Noel, MD as Consulting Physician (Pulmonary Disease)  Assessment/Plan Problem List Items Addressed This Visit    Essential hypertension (Chronic)    Blood pressure is controlled. off Losartan 25mg        Backache (Chronic)    Etiology of the back pain. Continue Fentanyl and Tylenol for pain control. Activity as tolerated.       Memory loss (Chronic)    Progressing. off Namenda R>B. SNF for care needs. Comfort measures.       DM type 2 with diabetic peripheral neuropathy (Chronic)    06/06/15 Hgb A1c 6.7 diet control.       Chronic combined systolic and diastolic CHF (congestive heart failure) (Chronic)    O2 dependent, DOE apparent, activity as tolerated, Hospice service and comfort care measures.       Constipation    Stable,  06/10/15 Lactulose prn.      Candidiasis of skin - Primary    Persisted entire R+L buttocks, groins, sacral area, continue pressure reduction, meticulous personal hygiene, avoid moist, Nystatin powder, adding Diflucan 100mg  daily x 3 days.           Family/ staff Communication: reduce moist and pressure in buttocks.   Labs/tests ordered: none  ManXie Coreon Simkins NP [facility] [address], [place] [address] On Call:  [phone] & follow prompts after 5pm & weekends Office Phone:  [phone] Office Fax:  [phone]

## 2015-09-02 NOTE — Assessment & Plan Note (Signed)
Blood pressure is controlled. off Losartan 25mg  

## 2015-09-02 NOTE — Assessment & Plan Note (Signed)
Etiology of the back pain. Continue Fentanyl and Tylenol for pain control. Activity as tolerated.    

## 2015-09-02 NOTE — Assessment & Plan Note (Signed)
O2 dependent, DOE apparent, activity as tolerated, Hospice service and comfort care measures.     

## 2015-09-02 NOTE — Assessment & Plan Note (Signed)
Stable, 06/10/15 Lactulose prn.

## 2015-09-07 DIAGNOSIS — M25559 Pain in unspecified hip: Secondary | ICD-10-CM | POA: Diagnosis not present

## 2015-09-09 ENCOUNTER — Encounter: Payer: Self-pay | Admitting: Nurse Practitioner

## 2015-09-09 ENCOUNTER — Non-Acute Institutional Stay (SKILLED_NURSING_FACILITY): Payer: Medicare Other | Admitting: Nurse Practitioner

## 2015-09-09 DIAGNOSIS — I480 Paroxysmal atrial fibrillation: Secondary | ICD-10-CM

## 2015-09-09 DIAGNOSIS — B372 Candidiasis of skin and nail: Secondary | ICD-10-CM

## 2015-09-09 DIAGNOSIS — L03115 Cellulitis of right lower limb: Secondary | ICD-10-CM

## 2015-09-09 NOTE — Progress Notes (Signed)
Patient ID: Zachary Harris, male   DOB: 1923-03-24, 81 y.o.   MRN: [id number]  Location:  SNF [facility] Provider:  Marlana Latus NP  Code Status:  DNR Goals of care: Advanced Directive information    Chief Complaint  Patient presents with  . Medical Management of Chronic Issues  . Acute Visit    RLE redness, warmth, scratched injuries     HPI: Patient is a 81 y.o. male seen in the SNF at [facility] today for evaluation of the acute onset of the RLE redness, warmth, tenderness, uncertain duration. X-ray 09/07/15 R hip, R femur, pelvis unremarkable following fall out of bed, skin tear left upper arm.   Review of Systems:  Review of Systems  Constitutional: Negative for fever and chills.  HENT: Positive for hearing loss. Negative for ear pain.   Eyes: Negative for photophobia and pain.       Corrective lenses  Respiratory: Positive for shortness of breath. Negative for cough and wheezing.   Cardiovascular: Negative for leg swelling.       Pacemaker. History of syncope related to cardiac arrhythmia.  Gastrointestinal: Negative for abdominal pain.  Genitourinary: Positive for urgency and frequency. Negative for dysuria and flank pain.       Underwent Urology evaluation in the past, off meds. Episodes of incontinence persist.  Musculoskeletal: Positive for myalgias, back pain and neck pain (Fall on 03/15/15 causing pain in the left knee and effusion.).       Poor balance and gait disturbance. Lower back pain Sever kyphosis  Skin:       Rough area on left cheek R+L buttocks, groins, sacrum excoriated, small open area left buttock Left upper arm skin tears RLE redness, scratched injuries, warmth, mild swelling, denied pain.   Neurological: Negative for tremors, seizures and weakness.       Memory loss  Endo/Heme/Allergies: Negative for polydipsia.  Psychiatric/Behavioral:       Mild to moderate memory disturbance.    Past Medical History  Diagnosis Date  . ANEMIA-IRON DEFICIENCY  05/27/2007  . ANXIETY 11/14/2007  . Intermittent high-grade heart block 09/05/2009    s/p pacemaker  . BENIGN PROSTATIC HYPERTROPHY 11/14/2007  . CAROTID ARTERY DISEASE 10/02/2010  . Pacemaker-St. Jude 02/02/2008    St. Jude  . COLONIC POLYPS, HX OF 05/27/2007  . CORONARY ARTERY DISEASE 11/14/2007    prior bypass [phone]; Inferior MI 11/07; Severe 3 vessel disease; occluded SVG to RCA; SVG to OM subtotalled, SVG to diagonal with ostial 70 and 80 mid; EF 45. PCI of SVG to OM. FU myovue showed EF 56 with inferior infarct and very mild peri-infarct ischemia  . DEPRESSION 11/14/2007  . DIABETES MELLITUS, TYPE II 11/14/2007  . DIVERTICULITIS, HX OF 05/27/2007  . DVT (deep venous thrombosis) 03/21/2011  . FOOT PAIN, BILATERAL 12/08/2010  . GERD 11/14/2007  . HYPERLIPIDEMIA 11/14/2007  . HYPERTENSION 11/14/2007  . HYPOTENSION, ORTHOSTATIC 03/06/2011  . Memory loss 06/01/2008  . OSTEOPOROSIS 06/02/2010  . PERIPHERAL VASCULAR DISEASE 11/14/2007  . RENAL INSUFFICIENCY 11/14/2007  . SECONDARY DM W/PERIPHERAL CIRC D/O UNCONTROLLED 11/07/2010  . SYNCOPE 09/08/2008  . Unspecified hearing loss 05/13/2009  . Prostate cancer   . Myalgia and myositis, unspecified 04/24/2011  . Other malaise and fatigue 04/24/2011  . Peripheral neuropathy 03/27/2011  . Seborrheic keratosis 06/05/2011  . Osteoarthrosis, unspecified whether generalized or localized, unspecified site   . Ischemic cardiomyopathy     a. echo (10/15):  mod focal basal septal hypertrophy, EF 40-45%, inf  AK, Gr 1 DD, Al sclerosis, mild AI, MAC, mild MR, mod LAE, lipomatous hypertrophy, small effusion (no hemodynamic compromise)   . Effusion of left knee 03/17/2015  . Severe back pain   . DM (diabetes mellitus), type 2 with renal complications 9/32/3557  . Failure to thrive in adult 05/03/2015  . Chronic combined systolic and diastolic CHF (congestive heart failure) 10/10/2014  . CKD stage 3 due to type 1 diabetes mellitus 03/17/2015  . Dyspnea 11/09/2014    . Hearing loss 05/13/2009    Qualifier: Diagnosis of  By: Jenny Reichmann MD, Hunt Oris   . Hyperlipemia 11/14/2007    Qualifier: Diagnosis of  By: Jenny Reichmann MD, Hunt Oris    . Generalized weakness 05/20/2015  . Protein-calorie malnutrition, severe 05/23/2015  . Vertebral compression fracture 09/05/2009    Qualifier: Diagnosis of  By: Jenny Reichmann MD, Hunt Oris   . [name] 04/04/2015  . Weight loss 10/10/2014    Patient Active Problem List   Diagnosis Date Noted  . Cellulitis of leg, right 09/13/2015  . Fever presenting with conditions classified elsewhere 08/16/2015  . Candidiasis of skin 06/07/2015  . Constipation 05/27/2015  . Protein-calorie malnutrition, severe 05/23/2015  . Palliative care encounter   . Compression fracture 05/20/2015  . Generalized weakness 05/20/2015  . Dehydration 05/20/2015  . DM (diabetes mellitus), type 2 with renal complications 32/20/2542  . Failure to thrive in adult 05/03/2015  . Weak 04/04/2015  . CKD stage 3 due to type 1 diabetes mellitus 03/17/2015  . Chronic respiratory failure 01/13/2015  . Dyspnea 11/09/2014  . Paroxysmal atrial fibrillation 10/25/2014  . Ischemic cardiomyopathy 10/10/2014  . Chronic combined systolic and diastolic CHF (congestive heart failure) 10/10/2014  . Weight loss 10/10/2014  . Other malaise and fatigue 08/16/2013  . Infection of urinary tract 08/16/2013  . Urine retention 08/16/2013  . Urge incontinence 08/16/2013  . Nocturia 08/16/2013  . Nodular prostate with urinary obstruction 08/16/2013  . DM type 2 with diabetic peripheral neuropathy 04/28/2013  . Malignant neoplasm of prostate 02/25/2013  . HYPOTENSION, ORTHOSTATIC 03/06/2011  . Osteoporosis 06/02/2010  . Backache 09/05/2009  . Vertebral compression fracture 09/05/2009  . Intermittent high-grade heart block 09/05/2009  . Hearing loss 05/13/2009  . Memory loss 06/01/2008  . Pacemaker-St.Jude 02/02/2008  . Hyperlipemia 11/14/2007  . ANXIETY 11/14/2007  . Depression 11/14/2007  .  Essential hypertension 11/14/2007  . MYOCARDIAL INFARCTION, HX OF 11/14/2007  . CAD (coronary artery disease) 11/14/2007  . Peripheral vascular disease 11/14/2007  . GERD 11/14/2007  . Iron deficiency anemia 05/27/2007  . COLONIC POLYPS, HX OF 05/27/2007    Allergies  Allergen Reactions  . Hydrocodone     hallucination  . Tramadol     hallucination    Medications: Patient's Medications  New Prescriptions   No medications on file  Previous Medications   ACETAMINOPHEN (TYLENOL) 500 MG TABLET    Take 2 tablets (1,000 mg total) by mouth 2 (two) times daily.   FEEDING SUPPLEMENT, GLUCERNA SHAKE, (GLUCERNA SHAKE) LIQD    Take 237 mLs by mouth 2 (two) times daily between meals.   FENTANYL (DURAGESIC) 25 MCG/HR PATCH    Apply fresh patch every 3 days to help pain. Remove old patch.   LACTULOSE (CHRONULAC) 10 GM/15ML SOLUTION    Take 20 g by mouth every other day. Every other day as needed   NITROGLYCERIN (NITROSTAT) 0.4 MG SL TABLET    Place 0.4 mg under the tongue every 5 (five) minutes as needed for chest pain.  Modified Medications   No medications on file  Discontinued Medications   No medications on file    Physical Exam: Filed Vitals:   09/09/15 1109  BP: 100/52  Pulse: 76  Temp: 97.8 F (36.6 C)  TempSrc: Tympanic  Resp: 16   There is no weight on file to calculate BMI.  Physical Exam  Constitutional: He is oriented to person, place, and time.  Frail  HENT:  Head: Normocephalic and atraumatic.  Nose: Nose normal.  Bilateral hearing aids.  Eyes: Conjunctivae are normal. Pupils are equal, round, and reactive to light.  Prescription lenses.  Neck: Normal range of motion. Neck supple. No JVD present. No tracheal deviation present. No thyromegaly present.  Cardiovascular: Normal rate and regular rhythm.  Exam reveals no gallop and no friction rub.   Murmur heard.  Systolic murmur is present with a grade of 1/6  Absent DP and PT  Pulmonary/Chest: Effort normal and  breath sounds normal. No respiratory distress. He has no wheezes. He has no rales. He exhibits no tenderness.  Using portable oxygen at 2 L/m today.  Abdominal: Soft. Bowel sounds are normal. He exhibits no distension and no mass. There is no tenderness.  Musculoskeletal: Normal range of motion. He exhibits no edema or tenderness.  Pain in the left SI joint area. Limps when walking. Favors the left side. Effusion of the left knee. Scars in the left knee from previous surgery. Tender at the left knee with movement of the patella. Positional lower back pain Server kyphosis  Lymphadenopathy:    He has no cervical adenopathy.  Neurological: He is alert and oriented to person, place, and time. He has normal strength. A sensory deficit is present. No cranial nerve deficit. Gait abnormal.  07/13/14 MMSE 28/30. Passed clock drawing. Diminshed sensation to vabration and monofilament.  Skin: Skin is warm and dry. No erythema. No pallor.  Rough area on the left cheek. Cystic lesion in the mid forehead. Mid sternal surgical scar. Left upper chest pacemaker. R+L buttocks, groins, sacrum excoriated, small open area left buttock Left upper arm skin tears RLE redness, scratched injuries, warmth, mild swelling, denied pain.     Psychiatric: He has a normal mood and affect. His behavior is normal. Thought content normal.  Patient is in denial regarding the extent of his memory deficit.    Labs reviewed: Basic Metabolic Panel:  Recent Labs  10/08/14 1603  05/20/15 1341 05/21/15 0523 05/22/15 0533 06/06/15 08/17/15 08/30/15  NA  --   < > 137 136 135 136* 136* 137  K  --   < > 3.8 3.4* 4.4 4.4 4.0 4.4  CL  --   < > 104 104 107  --   --   --   CO2  --   < > 23 25 21*  --   --   --   GLUCOSE  --   < > 149* 137* 126*  --   --   --   BUN  --   < > 64* 57* 33* 32* 37* 25*  CREATININE 1.55*  < > 1.67* 1.47* 0.92 1.0 0.7 0.7  CALCIUM  --   < > 9.5 8.8* 8.6*  --   --   --   MG 2.0  --   --   --   --   --    --   --   < > = values in this interval not displayed.  Liver Function Tests:  Recent Labs  10/08/14 1021  05/03/15 1040  05/20/15 1341 06/06/15 08/17/15 08/30/15  AST 25  < > 21  < > 35 27 25 17   ALT 11  < > 11  < > 19 20 16  8*  ALKPHOS 44  < > 66  < > 122 162* 156* 190*  BILITOT 0.9  --  0.8  --  0.9  --   --   --   PROT 7.3  --  7.1  --  6.8  --   --   --   ALBUMIN 3.6  --   --   --  3.1*  --   --   --   < > = values in this interval not displayed.  CBC:  Recent Labs  05/03/15 1040 05/20/15 1341 05/21/15 0523 05/22/15 0533 06/06/15 08/17/15 08/30/15  WBC 9.6 8.3 6.3 5.4 8.7 10.5 8.2  NEUTROABS 6.8 5.9  --   --   --   --   --   HGB  --  13.1 11.5* 11.9* 12.0* 11.3* 11.1*  HCT 42.1 39.0 34.6* 35.3* 37* 34* 34*  MCV  --  90.9 90.1 90.1  --   --   --   PLT  --  182 147* 126* 264 144* 239    Lab Results  Component Value Date   TSH 3.16 06/06/2015   Lab Results  Component Value Date   HGBA1C 6.7* 06/06/2015   Lab Results  Component Value Date   CHOL 132 02/07/2015   HDL 33* 02/07/2015   LDLCALC 76 02/07/2015   LDLDIRECT 85.6 07/19/2010   TRIG 115 02/07/2015   CHOLHDL 4 03/06/2011    Significant Diagnostic Results since last visit: none  Patient Care Team: Estill Dooms, MD as PCP - General (Internal Medicine) Irine Seal, MD as Consulting Physician (Urology) Renella Cunas, MD as Consulting Physician (Cardiology) [facility] Lelon Perla, MD as Consulting Physician (Cardiology) Roselia Snipe X, NP as Nurse Practitioner (Nurse Practitioner) Rigoberto Noel, MD as Consulting Physician (Pulmonary Disease)  Assessment/Plan Problem List Items Addressed This Visit    Paroxysmal atrial fibrillation    Rate is in control w/o medications.       Candidiasis of skin    Improved.       Cellulitis of leg, right - Primary    1% hydrocortisone cream bid to RLE, Doxycycline 100mg  bid x 7 days. Observe the patient. Venous Doppler to r/o DVT           Family/ staff Communication: observe the patient  Labs/tests ordered: venous doppler RLE  [facility] Cleotilde Spadaccini NP [facility] [address], [place] [address] On Call:  [phone] & follow prompts after 5pm & weekends Office Phone:  [phone] Office Fax:  [phone]

## 2015-09-13 ENCOUNTER — Non-Acute Institutional Stay (SKILLED_NURSING_FACILITY): Payer: Medicare Other | Admitting: Nurse Practitioner

## 2015-09-13 ENCOUNTER — Encounter: Payer: Self-pay | Admitting: Nurse Practitioner

## 2015-09-13 DIAGNOSIS — M79661 Pain in right lower leg: Secondary | ICD-10-CM

## 2015-09-13 DIAGNOSIS — L03115 Cellulitis of right lower limb: Secondary | ICD-10-CM

## 2015-09-13 DIAGNOSIS — M5440 Lumbago with sciatica, unspecified side: Secondary | ICD-10-CM | POA: Diagnosis not present

## 2015-09-13 DIAGNOSIS — I10 Essential (primary) hypertension: Secondary | ICD-10-CM | POA: Diagnosis not present

## 2015-09-13 DIAGNOSIS — K5900 Constipation, unspecified: Secondary | ICD-10-CM

## 2015-09-13 DIAGNOSIS — M25569 Pain in unspecified knee: Secondary | ICD-10-CM | POA: Diagnosis not present

## 2015-09-13 DIAGNOSIS — M898X6 Other specified disorders of bone, lower leg: Secondary | ICD-10-CM | POA: Insufficient documentation

## 2015-09-13 DIAGNOSIS — R413 Other amnesia: Secondary | ICD-10-CM

## 2015-09-13 DIAGNOSIS — M25579 Pain in unspecified ankle and joints of unspecified foot: Secondary | ICD-10-CM | POA: Diagnosis not present

## 2015-09-13 NOTE — Assessment & Plan Note (Signed)
C/o the lower 1/3 leg pain, worsens with weight bearing, increased redness, swelling, and warmth regardless topical hydrocortisone cream and oral Doxy, venous Doppler ruled out DVT, may X-ray Tibia/Fibula, ankle, foot to r/o fxs.

## 2015-09-13 NOTE — Assessment & Plan Note (Signed)
Rate is in control w/o medications.    

## 2015-09-13 NOTE — Assessment & Plan Note (Signed)
1% hydrocortisone cream bid to RLE, Doxycycline 100mg  bid x 7 days. Observe the patient. Venous Doppler to r/o DVT

## 2015-09-13 NOTE — Assessment & Plan Note (Signed)
Controlled, off meds.  

## 2015-09-13 NOTE — Assessment & Plan Note (Signed)
Etiology of the back pain. Continue Fentanyl and Tylenol for pain control. Activity as tolerated.    

## 2015-09-13 NOTE — Assessment & Plan Note (Signed)
Stable, continue 06/10/15 Lactulose prn.

## 2015-09-13 NOTE — Assessment & Plan Note (Signed)
Progressing. off Namenda R>B. SNF for care needs. Comfort measures.   

## 2015-09-13 NOTE — Assessment & Plan Note (Signed)
Improved

## 2015-09-13 NOTE — Progress Notes (Signed)
Patient ID: Zachary Harris, male   DOB: [birth year], 81 y.o.   MRN: [id number]  Location:  SNF [facility] Provider:  Marlana Latus NP  Code Status:  DNR Goals of care: Advanced Directive information    Chief Complaint  Patient presents with  . Medical Management of Chronic Issues  . Acute Visit    worsened RLE swelling, redness, warmth, and pain     HPI: Patient is a 81 y.o. male seen in the SNF at [facility] today for evaluation of  Worsened lower 1/3 RLE redness, warmth, tenderness, and swelling, 09/12/15 venous Doppler ruled out DVT of the RLE, topical hydrocortisone, oral Doxycycline didn't improve the symptoms. Pain with weight bearing is new but able to walk a few steps with walker.   Review of Systems:  Review of Systems  Constitutional: Negative for fever and chills.  HENT: Positive for hearing loss. Negative for ear pain.   Eyes: Negative for photophobia and pain.       Corrective lenses  Respiratory: Positive for shortness of breath. Negative for cough and wheezing.   Cardiovascular: Positive for leg swelling.       Pacemaker. History of syncope related to cardiac arrhythmia. Right lower leg swelling, redness, pain with weight bearing  Gastrointestinal: Negative for abdominal pain.  Genitourinary: Positive for urgency and frequency. Negative for dysuria and flank pain.       Underwent Urology evaluation in the past, off meds. Episodes of incontinence persist.  Musculoskeletal: Positive for myalgias, back pain and neck pain (Fall on 03/15/15 causing pain in the left knee and effusion.).       Poor balance and gait disturbance. Lower back pain Sever kyphosis  Skin:       Rough area on left cheek R+L buttocks, groins, sacrum excoriated, small open area left buttock Left upper arm skin tears RLE redness, warmth, worsened swelling, and pain with weight bearing.   Neurological: Negative for tremors, seizures and weakness.       Memory loss  Endo/Heme/Allergies: Negative for  polydipsia.  Psychiatric/Behavioral:       Mild to moderate memory disturbance.    Past Medical History  Diagnosis Date  . ANEMIA-IRON DEFICIENCY 05/27/2007  . ANXIETY 11/14/2007  . Intermittent high-grade heart block 09/05/2009    s/p pacemaker  . BENIGN PROSTATIC HYPERTROPHY 11/14/2007  . CAROTID ARTERY DISEASE 10/02/2010  . Pacemaker-St. Jude 02/02/2008    St. Jude  . COLONIC POLYPS, HX OF 05/27/2007  . CORONARY ARTERY DISEASE 11/14/2007    prior bypass [phone]; Inferior MI 11/07; Severe 3 vessel disease; occluded SVG to RCA; SVG to OM subtotalled, SVG to diagonal with ostial 70 and 80 mid; EF 45. PCI of SVG to OM. FU myovue showed EF 56 with inferior infarct and very mild peri-infarct ischemia  . DEPRESSION 11/14/2007  . DIABETES MELLITUS, TYPE II 11/14/2007  . DIVERTICULITIS, HX OF 05/27/2007  . DVT (deep venous thrombosis) 03/21/2011  . FOOT PAIN, BILATERAL 12/08/2010  . GERD 11/14/2007  . HYPERLIPIDEMIA 11/14/2007  . HYPERTENSION 11/14/2007  . HYPOTENSION, ORTHOSTATIC 03/06/2011  . Memory loss 06/01/2008  . OSTEOPOROSIS 06/02/2010  . PERIPHERAL VASCULAR DISEASE 11/14/2007  . RENAL INSUFFICIENCY 11/14/2007  . SECONDARY DM W/PERIPHERAL CIRC D/O UNCONTROLLED 11/07/2010  . SYNCOPE 09/08/2008  . Unspecified hearing loss 05/13/2009  . Prostate cancer   . Myalgia and myositis, unspecified 04/24/2011  . Other malaise and fatigue 04/24/2011  . Peripheral neuropathy 03/27/2011  . Seborrheic keratosis 06/05/2011  . Osteoarthrosis, unspecified whether generalized  or localized, unspecified site   . Ischemic cardiomyopathy     a. echo (10/15):  mod focal basal septal hypertrophy, EF 40-45%, inf AK, Gr 1 DD, Al sclerosis, mild AI, MAC, mild MR, mod LAE, lipomatous hypertrophy, small effusion (no hemodynamic compromise)   . Effusion of left knee 03/17/2015  . Severe back pain   . DM (diabetes mellitus), type 2 with renal complications 7/51/7001  . Failure to thrive in adult 05/03/2015  . Chronic  combined systolic and diastolic CHF (congestive heart failure) 10/10/2014  . CKD stage 3 due to type 1 diabetes mellitus 03/17/2015  . Dyspnea 11/09/2014  . Hearing loss 05/13/2009    Qualifier: Diagnosis of  By: Jenny Reichmann MD, Hunt Oris   . Hyperlipemia 11/14/2007    Qualifier: Diagnosis of  By: Jenny Reichmann MD, Hunt Oris    . Generalized weakness 05/20/2015  . Protein-calorie malnutrition, severe 05/23/2015  . Vertebral compression fracture 09/05/2009    Qualifier: Diagnosis of  By: Jenny Reichmann MD, Hunt Oris   . [name] 04/04/2015  . Weight loss 10/10/2014    Patient Active Problem List   Diagnosis Date Noted  . Cellulitis of leg, right 09/13/2015  . Pain of right tibia 09/13/2015  . Fever presenting with conditions classified elsewhere 08/16/2015  . Candidiasis of skin 06/07/2015  . Constipation 05/27/2015  . Protein-calorie malnutrition, severe 05/23/2015  . Palliative care encounter   . Compression fracture 05/20/2015  . Generalized weakness 05/20/2015  . Dehydration 05/20/2015  . DM (diabetes mellitus), type 2 with renal complications 74/94/4967  . Failure to thrive in adult 05/03/2015  . Weak 04/04/2015  . CKD stage 3 due to type 1 diabetes mellitus 03/17/2015  . Chronic respiratory failure 01/13/2015  . Dyspnea 11/09/2014  . Paroxysmal atrial fibrillation 10/25/2014  . Ischemic cardiomyopathy 10/10/2014  . Chronic combined systolic and diastolic CHF (congestive heart failure) 10/10/2014  . Weight loss 10/10/2014  . Other malaise and fatigue 08/16/2013  . Infection of urinary tract 08/16/2013  . Urine retention 08/16/2013  . Urge incontinence 08/16/2013  . Nocturia 08/16/2013  . Nodular prostate with urinary obstruction 08/16/2013  . DM type 2 with diabetic peripheral neuropathy 04/28/2013  . Malignant neoplasm of prostate 02/25/2013  . HYPOTENSION, ORTHOSTATIC 03/06/2011  . Osteoporosis 06/02/2010  . Backache 09/05/2009  . Vertebral compression fracture 09/05/2009  . Intermittent high-grade  heart block 09/05/2009  . Hearing loss 05/13/2009  . Memory loss 06/01/2008  . Pacemaker-St.Jude 02/02/2008  . Hyperlipemia 11/14/2007  . ANXIETY 11/14/2007  . Depression 11/14/2007  . Essential hypertension 11/14/2007  . MYOCARDIAL INFARCTION, HX OF 11/14/2007  . CAD (coronary artery disease) 11/14/2007  . Peripheral vascular disease 11/14/2007  . GERD 11/14/2007  . Iron deficiency anemia 05/27/2007  . COLONIC POLYPS, HX OF 05/27/2007    Allergies  Allergen Reactions  . Hydrocodone     hallucination  . Tramadol     hallucination    Medications: Patient's Medications  New Prescriptions   No medications on file  Previous Medications   ACETAMINOPHEN (TYLENOL) 500 MG TABLET    Take 2 tablets (1,000 mg total) by mouth 2 (two) times daily.   FEEDING SUPPLEMENT, GLUCERNA SHAKE, (GLUCERNA SHAKE) LIQD    Take 237 mLs by mouth 2 (two) times daily between meals.   FENTANYL (DURAGESIC) 25 MCG/HR PATCH    Apply fresh patch every 3 days to help pain. Remove old patch.   LACTULOSE (CHRONULAC) 10 GM/15ML SOLUTION    Take 20 g by mouth every other day.  Every other day as needed   NITROGLYCERIN (NITROSTAT) 0.4 MG SL TABLET    Place 0.4 mg under the tongue every 5 (five) minutes as needed for chest pain.  Modified Medications   No medications on file  Discontinued Medications   No medications on file    Physical Exam: Filed Vitals:   09/13/15 1234  BP: 112/61  Pulse: 76  Temp: 98.6 F (37 C)  TempSrc: Tympanic  Resp: 18   There is no weight on file to calculate BMI.  Physical Exam  Constitutional: He is oriented to person, place, and time.  Frail  HENT:  Head: Normocephalic and atraumatic.  Nose: Nose normal.  Bilateral hearing aids.  Eyes: Conjunctivae are normal. Pupils are equal, round, and reactive to light.  Prescription lenses.  Neck: Normal range of motion. Neck supple. No JVD present. No tracheal deviation present. No thyromegaly present.  Cardiovascular: Normal  rate and regular rhythm.  Exam reveals no gallop and no friction rub.   Murmur heard.  Systolic murmur is present with a grade of 1/6  Absent DP and PT  Pulmonary/Chest: Effort normal and breath sounds normal. No respiratory distress. He has no wheezes. He has no rales. He exhibits no tenderness.  Using portable oxygen at 2 L/m today.  Abdominal: Soft. Bowel sounds are normal. He exhibits no distension and no mass. There is no tenderness.  Musculoskeletal: Normal range of motion. He exhibits edema. He exhibits no tenderness.  Pain in the left SI joint area. Limps when walking. Favors the left side. Effusion of the left knee. Scars in the left knee from previous surgery. Tender at the left knee with movement of the patella. Positional lower back pain Server kyphosis Lower 1/3 RLE redness, warmth, tenderness with weight bearing, worsened swelling.   Lymphadenopathy:    He has no cervical adenopathy.  Neurological: He is alert and oriented to person, place, and time. He has normal strength. A sensory deficit is present. No cranial nerve deficit. Gait abnormal.  07/13/14 MMSE 28/30. Passed clock drawing. Diminshed sensation to vabration and monofilament.  Skin: Skin is warm and dry. No erythema. No pallor.  Rough area on the left cheek. Cystic lesion in the mid forehead. Mid sternal surgical scar. Left upper chest pacemaker. R+L buttocks, groins, sacrum excoriated, small open area left buttock Left upper arm skin tears 1/3 lower RLE redness, warmth, worsened swelling, and pain with weight bearing.     Psychiatric: He has a normal mood and affect. His behavior is normal. Thought content normal.  Patient is in denial regarding the extent of his memory deficit.    Labs reviewed: Basic Metabolic Panel:  Recent Labs  10/08/14 1603  05/20/15 1341 05/21/15 0523 05/22/15 0533 06/06/15 08/17/15 08/30/15  NA  --   < > 137 136 135 136* 136* 137  K  --   < > 3.8 3.4* 4.4 4.4 4.0 4.4  CL  --   <  > 104 104 107  --   --   --   CO2  --   < > 23 25 21*  --   --   --   GLUCOSE  --   < > 149* 137* 126*  --   --   --   BUN  --   < > 64* 57* 33* 32* 37* 25*  CREATININE 1.55*  < > 1.67* 1.47* 0.92 1.0 0.7 0.7  CALCIUM  --   < > 9.5 8.8* 8.6*  --   --   --  MG 2.0  --   --   --   --   --   --   --   < > = values in this interval not displayed.  Liver Function Tests:  Recent Labs  10/08/14 1021  05/03/15 1040  05/20/15 1341 06/06/15 08/17/15 08/30/15  AST 25  < > 21  < > 35 27 25 17   ALT 11  < > 11  < > 19 20 16  8*  ALKPHOS 44  < > 66  < > 122 162* 156* 190*  BILITOT 0.9  --  0.8  --  0.9  --   --   --   PROT 7.3  --  7.1  --  6.8  --   --   --   ALBUMIN 3.6  --   --   --  3.1*  --   --   --   < > = values in this interval not displayed.  CBC:  Recent Labs  05/03/15 1040 05/20/15 1341 05/21/15 0523 05/22/15 0533 06/06/15 08/17/15 08/30/15  WBC 9.6 8.3 6.3 5.4 8.7 10.5 8.2  NEUTROABS 6.8 5.9  --   --   --   --   --   HGB  --  13.1 11.5* 11.9* 12.0* 11.3* 11.1*  HCT 42.1 39.0 34.6* 35.3* 37* 34* 34*  MCV  --  90.9 90.1 90.1  --   --   --   PLT  --  182 147* 126* 264 144* 239    Lab Results  Component Value Date   TSH 3.16 06/06/2015   Lab Results  Component Value Date   HGBA1C 6.7* 06/06/2015   Lab Results  Component Value Date   CHOL 132 02/07/2015   HDL 33* 02/07/2015   LDLCALC 76 02/07/2015   LDLDIRECT 85.6 07/19/2010   TRIG 115 02/07/2015   CHOLHDL 4 03/06/2011    Significant Diagnostic Results since last visit: none  Patient Care Team: Estill Dooms, MD as PCP - General (Internal Medicine) Irine Seal, MD as Consulting Physician (Urology) Renella Cunas, MD as Consulting Physician (Cardiology) [facility] Lelon Perla, MD as Consulting Physician (Cardiology) Man Mast X, NP as Nurse Practitioner (Nurse Practitioner) Rigoberto Noel, MD as Consulting Physician (Pulmonary Disease)  Assessment/Plan Problem List Items Addressed This Visit     Essential hypertension (Chronic)    Controlled, off meds.       Backache (Chronic)    Etiology of the back pain. Continue Fentanyl and Tylenol for pain control. Activity as tolerated.       Memory loss (Chronic)    Progressing. off Namenda R>B. SNF for care needs. Comfort measures.       Constipation    Stable, continue 06/10/15 Lactulose prn.      Cellulitis of leg, right - Primary    The right lower 1/3 RLE worsened redness, warmth, tenderness with weight bearing, and swelling, complete Doxy, X-ray to r/u fx.       Pain of right tibia    C/o the lower 1/3 leg pain, worsens with weight bearing, increased redness, swelling, and warmth regardless topical hydrocortisone cream and oral Doxy, venous Doppler ruled out DVT, may X-ray Tibia/Fibula, ankle, foot to r/o fxs.           Family/ staff Communication: WBAT for now.   Labs/tests ordered: X-ray the right tibia, fibula, ankle, foot  ManXie Mast NP [facility] [address]  [place], [place] [address] On Call:  [phone] & follow prompts after 5pm & weekends Office Phone:  [phone] Office Fax:  [phone]

## 2015-09-13 NOTE — Assessment & Plan Note (Signed)
The right lower 1/3 RLE worsened redness, warmth, tenderness with weight bearing, and swelling, complete Doxy, X-ray to r/u fx.

## 2015-09-15 ENCOUNTER — Encounter: Payer: Self-pay | Admitting: Internal Medicine

## 2015-09-16 ENCOUNTER — Encounter: Payer: Self-pay | Admitting: Nurse Practitioner

## 2015-09-16 ENCOUNTER — Non-Acute Institutional Stay (SKILLED_NURSING_FACILITY): Payer: Medicare Other | Admitting: Nurse Practitioner

## 2015-09-16 DIAGNOSIS — I480 Paroxysmal atrial fibrillation: Secondary | ICD-10-CM | POA: Diagnosis not present

## 2015-09-16 DIAGNOSIS — M898X6 Other specified disorders of bone, lower leg: Secondary | ICD-10-CM

## 2015-09-16 DIAGNOSIS — S8011XA Contusion of right lower leg, initial encounter: Secondary | ICD-10-CM | POA: Insufficient documentation

## 2015-09-16 DIAGNOSIS — M5440 Lumbago with sciatica, unspecified side: Secondary | ICD-10-CM | POA: Diagnosis not present

## 2015-09-16 DIAGNOSIS — S8011XD Contusion of right lower leg, subsequent encounter: Secondary | ICD-10-CM | POA: Diagnosis not present

## 2015-09-16 DIAGNOSIS — L03115 Cellulitis of right lower limb: Secondary | ICD-10-CM

## 2015-09-16 DIAGNOSIS — I10 Essential (primary) hypertension: Secondary | ICD-10-CM

## 2015-09-16 DIAGNOSIS — M79661 Pain in right lower leg: Secondary | ICD-10-CM

## 2015-09-16 NOTE — Progress Notes (Signed)
Patient ID: Zachary Harris, male   DOB: 1923-08-09, 81 y.o.   MRN: [id number]  Location:  SNF [facility] Jakye Mullens:  Marlana Latus NP  Code Status:  DNR Goals of care: Advanced Directive information    Chief Complaint  Patient presents with  . Medical Management of Chronic Issues  . Acute Visit    persisted 1/3 lower RLE erythema, warmth, tenderness, swelling     HPI: Patient is a 81 y.o. male seen in the SNF at [facility] today for evaluation of limited improvement of 1/3 lower RLE erythema, warmth, tenderness, and swelling, completed 7 day course of Doxy, venous US ruled out DVT, X-ray RLE ruled out fxs. Slow healing contusion and cellulitis is more likely etiologies.   Review of Systems:  Review of Systems  Constitutional: Negative for fever and chills.  HENT: Positive for hearing loss. Negative for ear pain.   Eyes: Negative for photophobia and pain.       Corrective lenses  Respiratory: Positive for shortness of breath. Negative for cough and wheezing.   Cardiovascular: Positive for leg swelling.       Pacemaker. History of syncope related to cardiac arrhythmia. Right lower leg swelling, redness, pain with weight bearing  Gastrointestinal: Negative for abdominal pain.  Genitourinary: Positive for urgency and frequency. Negative for dysuria and flank pain.       Underwent Urology evaluation in the past, off meds. Episodes of incontinence persist.  Musculoskeletal: Positive for myalgias, back pain and neck pain (Fall on 03/15/15 causing pain in the left knee and effusion.).       Poor balance and gait disturbance. Lower back pain Sever kyphosis  Skin:       Rough area on left cheek R+L buttocks, groins, sacrum excoriated areas are much improved Limited improvement 1/3 lower RLE redness, warmth, swelling, and pain with weight bearing   Neurological: Negative for tremors, seizures and weakness.       Memory loss  Endo/Heme/Allergies: Negative for polydipsia.    Psychiatric/Behavioral:       Mild to moderate memory disturbance.    Past Medical History  Diagnosis Date  . ANEMIA-IRON DEFICIENCY 05/27/2007  . ANXIETY 11/14/2007  . Intermittent high-grade heart block 09/05/2009    s/p pacemaker  . BENIGN PROSTATIC HYPERTROPHY 11/14/2007  . CAROTID ARTERY DISEASE 10/02/2010  . Pacemaker-St. Jude 02/02/2008    St. Jude  . COLONIC POLYPS, HX OF 05/27/2007  . CORONARY ARTERY DISEASE 11/14/2007    prior bypass [phone]; Inferior MI 11/07; Severe 3 vessel disease; occluded SVG to RCA; SVG to OM subtotalled, SVG to diagonal with ostial 70 and 80 mid; EF 45. PCI of SVG to OM. FU myovue showed EF 56 with inferior infarct and very mild peri-infarct ischemia  . DEPRESSION 11/14/2007  . DIABETES MELLITUS, TYPE II 11/14/2007  . DIVERTICULITIS, HX OF 05/27/2007  . DVT (deep venous thrombosis) 03/21/2011  . FOOT PAIN, BILATERAL 12/08/2010  . GERD 11/14/2007  . HYPERLIPIDEMIA 11/14/2007  . HYPERTENSION 11/14/2007  . HYPOTENSION, ORTHOSTATIC 03/06/2011  . Memory loss 06/01/2008  . OSTEOPOROSIS 06/02/2010  . PERIPHERAL VASCULAR DISEASE 11/14/2007  . RENAL INSUFFICIENCY 11/14/2007  . SECONDARY DM W/PERIPHERAL CIRC D/O UNCONTROLLED 11/07/2010  . SYNCOPE 09/08/2008  . Unspecified hearing loss 05/13/2009  . Prostate cancer   . Myalgia and myositis, unspecified 04/24/2011  . Other malaise and fatigue 04/24/2011  . Peripheral neuropathy 03/27/2011  . Seborrheic keratosis 06/05/2011  . Osteoarthrosis, unspecified whether generalized or localized, unspecified site   .  Ischemic cardiomyopathy     a. echo (10/15):  mod focal basal septal hypertrophy, EF 40-45%, inf AK, Gr 1 DD, Al sclerosis, mild AI, MAC, mild MR, mod LAE, lipomatous hypertrophy, small effusion (no hemodynamic compromise)   . Effusion of left knee 03/17/2015  . Severe back pain   . DM (diabetes mellitus), type 2 with renal complications 2/97/9892  . Failure to thrive in adult 05/03/2015  . Chronic combined  systolic and diastolic CHF (congestive heart failure) 10/10/2014  . CKD stage 3 due to type 1 diabetes mellitus 03/17/2015  . Dyspnea 11/09/2014  . Hearing loss 05/13/2009    Qualifier: Diagnosis of  By: Jenny Reichmann MD, Hunt Oris   . Hyperlipemia 11/14/2007    Qualifier: Diagnosis of  By: Jenny Reichmann MD, Hunt Oris    . Generalized weakness 05/20/2015  . Protein-calorie malnutrition, severe 05/23/2015  . Vertebral compression fracture 09/05/2009    Qualifier: Diagnosis of  By: Jenny Reichmann MD, Hunt Oris   . [name] 04/04/2015  . Weight loss 10/10/2014    Patient Active Problem List   Diagnosis Date Noted  . Contusion of right lower leg 09/16/2015  . Cellulitis of leg, right 09/13/2015  . Pain of right tibia 09/13/2015  . Fever presenting with conditions classified elsewhere 08/16/2015  . Candidiasis of skin 06/07/2015  . Constipation 05/27/2015  . Protein-calorie malnutrition, severe 05/23/2015  . Palliative care encounter   . Compression fracture 05/20/2015  . Generalized weakness 05/20/2015  . Dehydration 05/20/2015  . DM (diabetes mellitus), type 2 with renal complications 11/94/1740  . Failure to thrive in adult 05/03/2015  . Weak 04/04/2015  . CKD stage 3 due to type 1 diabetes mellitus 03/17/2015  . Chronic respiratory failure 01/13/2015  . Dyspnea 11/09/2014  . Paroxysmal atrial fibrillation 10/25/2014  . Ischemic cardiomyopathy 10/10/2014  . Chronic combined systolic and diastolic CHF (congestive heart failure) 10/10/2014  . Weight loss 10/10/2014  . Other malaise and fatigue 08/16/2013  . Infection of urinary tract 08/16/2013  . Urine retention 08/16/2013  . Urge incontinence 08/16/2013  . Nocturia 08/16/2013  . Nodular prostate with urinary obstruction 08/16/2013  . DM type 2 with diabetic peripheral neuropathy 04/28/2013  . Malignant neoplasm of prostate 02/25/2013  . HYPOTENSION, ORTHOSTATIC 03/06/2011  . Osteoporosis 06/02/2010  . Backache 09/05/2009  . Vertebral compression fracture  09/05/2009  . Intermittent high-grade heart block 09/05/2009  . Hearing loss 05/13/2009  . Memory loss 06/01/2008  . Pacemaker-St.Jude 02/02/2008  . Hyperlipemia 11/14/2007  . ANXIETY 11/14/2007  . Depression 11/14/2007  . Essential hypertension 11/14/2007  . MYOCARDIAL INFARCTION, HX OF 11/14/2007  . CAD (coronary artery disease) 11/14/2007  . Peripheral vascular disease 11/14/2007  . GERD 11/14/2007  . Iron deficiency anemia 05/27/2007  . COLONIC POLYPS, HX OF 05/27/2007    Allergies  Allergen Reactions  . Hydrocodone     hallucination  . Tramadol     hallucination    Medications: Patient's Medications  New Prescriptions   No medications on file  Previous Medications   ACETAMINOPHEN (TYLENOL) 500 MG TABLET    Take 2 tablets (1,000 mg total) by mouth 2 (two) times daily.   FEEDING SUPPLEMENT, GLUCERNA SHAKE, (GLUCERNA SHAKE) LIQD    Take 237 mLs by mouth 2 (two) times daily between meals.   FENTANYL (DURAGESIC) 25 MCG/HR PATCH    Apply fresh patch every 3 days to help pain. Remove old patch.   LACTULOSE (CHRONULAC) 10 GM/15ML SOLUTION    Take 20 g by mouth every other  day. Every other day as needed   NITROGLYCERIN (NITROSTAT) 0.4 MG SL TABLET    Place 0.4 mg under the tongue every 5 (five) minutes as needed for chest pain.  Modified Medications   No medications on file  Discontinued Medications   No medications on file    Physical Exam: Filed Vitals:   09/16/15 1434  BP: 135/75  Pulse: 74  Temp: 97.2 F (36.2 C)  TempSrc: Tympanic  Resp: 19   There is no weight on file to calculate BMI.  Physical Exam  Constitutional: He is oriented to person, place, and time.  Frail  HENT:  Head: Normocephalic and atraumatic.  Nose: Nose normal.  Bilateral hearing aids.  Eyes: Conjunctivae are normal. Pupils are equal, round, and reactive to light.  Prescription lenses.  Neck: Normal range of motion. Neck supple. No JVD present. No thyromegaly present.  Cardiovascular:  Normal rate and regular rhythm.  Exam reveals no gallop and no friction rub.   Murmur heard.  Systolic murmur is present with a grade of 1/6  Absent DP and PT  Pulmonary/Chest: Effort normal and breath sounds normal. He has no wheezes. He has no rales. He exhibits no tenderness.  Using portable oxygen at 2 L/m today.  Abdominal: Soft. Bowel sounds are normal. He exhibits no distension. There is no tenderness.  Musculoskeletal: Normal range of motion. He exhibits edema. He exhibits no tenderness.  Pain in the left SI joint area. Limps when walking. Favors the left side. Effusion of the left knee. Scars in the left knee from previous surgery. Tender at the left knee with movement of the patella. Positional lower back pain Server kyphosis Lower 1/3 RLE redness, warmth, tenderness with weight bearing, swelling.   Neurological: He is alert and oriented to person, place, and time. He has normal strength. A sensory deficit is present. No cranial nerve deficit. Gait abnormal.  07/13/14 MMSE 28/30. Passed clock drawing. Diminshed sensation to vabration and monofilament.  Skin: Skin is warm and dry. No erythema. No pallor.  Rough area on the left cheek. Cystic lesion in the mid forehead. Mid sternal surgical scar. Left upper chest pacemaker. R+L buttocks, groins, sacrum excoriated areas are improved Limited improvement 1/3 lower RLE redness, warmth, swelling, and pain with weight bearing.     Psychiatric: He has a normal mood and affect. His behavior is normal. Thought content normal.  Patient is in denial regarding the extent of his memory deficit.    Labs reviewed: Basic Metabolic Panel:  Recent Labs  10/08/14 1603  05/20/15 1341 05/21/15 0523 05/22/15 0533 06/06/15 08/17/15 08/30/15  NA  --   < > 137 136 135 136* 136* 137  K  --   < > 3.8 3.4* 4.4 4.4 4.0 4.4  CL  --   < > 104 104 107  --   --   --   CO2  --   < > 23 25 21*  --   --   --   GLUCOSE  --   < > 149* 137* 126*  --   --   --    BUN  --   < > 64* 57* 33* 32* 37* 25*  CREATININE 1.55*  < > 1.67* 1.47* 0.92 1.0 0.7 0.7  CALCIUM  --   < > 9.5 8.8* 8.6*  --   --   --   MG 2.0  --   --   --   --   --   --   --   < > =  values in this interval not displayed.  Liver Function Tests:  Recent Labs  10/08/14 1021  05/03/15 1040  05/20/15 1341 06/06/15 08/17/15 08/30/15  AST 25  < > 21  < > 35 27 25 17   ALT 11  < > 11  < > 19 20 16  8*  ALKPHOS 44  < > 66  < > 122 162* 156* 190*  BILITOT 0.9  --  0.8  --  0.9  --   --   --   PROT 7.3  --  7.1  --  6.8  --   --   --   ALBUMIN 3.6  --   --   --  3.1*  --   --   --   < > = values in this interval not displayed.  CBC:  Recent Labs  05/03/15 1040 05/20/15 1341 05/21/15 0523 05/22/15 0533 06/06/15 08/17/15 08/30/15  WBC 9.6 8.3 6.3 5.4 8.7 10.5 8.2  NEUTROABS 6.8 5.9  --   --   --   --   --   HGB  --  13.1 11.5* 11.9* 12.0* 11.3* 11.1*  HCT 42.1 39.0 34.6* 35.3* 37* 34* 34*  MCV  --  90.9 90.1 90.1  --   --   --   PLT  --  182 147* 126* 264 144* 239    Lab Results  Component Value Date   TSH 3.16 06/06/2015   Lab Results  Component Value Date   HGBA1C 6.7* 06/06/2015   Lab Results  Component Value Date   CHOL 132 02/07/2015   HDL 33* 02/07/2015   LDLCALC 76 02/07/2015   LDLDIRECT 85.6 07/19/2010   TRIG 115 02/07/2015   CHOLHDL 4 03/06/2011    Significant Diagnostic Results since last visit: none  Patient Care Team: Estill Dooms, MD as PCP - General (Internal Medicine) Irine Seal, MD as Consulting Physician (Urology) Renella Cunas, MD as Consulting Physician (Cardiology) [facility] Lelon Perla, MD as Consulting Physician (Cardiology) Man Mast X, NP as Nurse Practitioner (Nurse Practitioner) Rigoberto Noel, MD as Consulting Physician (Pulmonary Disease)  Assessment/Plan Problem List Items Addressed This Visit    Paroxysmal atrial fibrillation    Rate is in control w/o medications.      Pain of right tibia    C/o the lower 1/3  leg pain, worsens with weight bearing, limited improvement redness, swelling, and warmth, continue Doxy 100mg  dai, apply Diclofenac gel qid, venous Doppler ruled out DVT, X-ray R Tibia/Fibula, ankle, foot ruled out fxs. Likely etiologies cellulitis and contusion. Adding Celebrex 200mg  daily x 2 weeks.       Essential hypertension (Chronic)    Controlled, off meds.       Contusion of right lower leg - Primary    Limited improvement 1/3 lower RLE redness, warmth, tenderness, swelling, venous Doppler ruled out DVT, X-ray ruled out fxs, Diclofenac gel qid to affected area, adding Celebrex 200mg  daily x 2weeks for pain and inflammation, continue 10 days Doxy 100mg  daily for cellulitis, observe the patient.       Cellulitis of leg, right    Limited improvement the right lower 1/3 RLE redness, warmth, tenderness with weight bearing, and swelling, continue Doxy 100mg  daily x 10 days, apply Diclofenac gel qid, observe.       Backache (Chronic)    Etiology of the back pain. Continue Fentanyl and Tylenol for pain control. Activity as tolerated.  Family/ staff Communication: observe the RLE  Labs/tests ordered: none  [facility] Mast NP [facility] [address], [place] [address] On Call:  [phone] & follow prompts after 5pm & weekends Office Phone:  [phone] Office Fax:  [phone]

## 2015-09-16 NOTE — Assessment & Plan Note (Signed)
Rate is in control w/o medications.    

## 2015-09-16 NOTE — Assessment & Plan Note (Signed)
Controlled, off meds.  

## 2015-09-16 NOTE — Assessment & Plan Note (Addendum)
C/o the lower 1/3 leg pain, worsens with weight bearing, limited improvement redness, swelling, and warmth, continue Doxy 100mg  dai, apply Diclofenac gel qid, venous Doppler ruled out DVT, X-ray R Tibia/Fibula, ankle, foot ruled out fxs. Likely etiologies cellulitis and contusion. Adding Celebrex 200mg  daily x 2 weeks.

## 2015-09-16 NOTE — Assessment & Plan Note (Signed)
Limited improvement the right lower 1/3 RLE redness, warmth, tenderness with weight bearing, and swelling, continue Doxy 100mg  daily x 10 days, apply Diclofenac gel qid, observe.

## 2015-09-16 NOTE — Assessment & Plan Note (Signed)
Limited improvement 1/3 lower RLE redness, warmth, tenderness, swelling, venous Doppler ruled out DVT, X-ray ruled out fxs, Diclofenac gel qid to affected area, adding Celebrex 200mg  daily x 2weeks for pain and inflammation, continue 10 days Doxy 100mg  daily for cellulitis, observe the patient.

## 2015-09-16 NOTE — Assessment & Plan Note (Signed)
Etiology of the back pain. Continue Fentanyl and Tylenol for pain control. Activity as tolerated.    

## 2015-09-26 ENCOUNTER — Encounter: Payer: Self-pay | Admitting: Internal Medicine

## 2015-09-26 ENCOUNTER — Non-Acute Institutional Stay: Payer: Medicare Other | Admitting: Internal Medicine

## 2015-09-26 DIAGNOSIS — R627 Adult failure to thrive: Secondary | ICD-10-CM | POA: Diagnosis not present

## 2015-09-26 DIAGNOSIS — F32A Depression, unspecified: Secondary | ICD-10-CM

## 2015-09-26 DIAGNOSIS — I5042 Chronic combined systolic (congestive) and diastolic (congestive) heart failure: Secondary | ICD-10-CM | POA: Diagnosis not present

## 2015-09-26 DIAGNOSIS — S8011XD Contusion of right lower leg, subsequent encounter: Secondary | ICD-10-CM

## 2015-09-26 DIAGNOSIS — F329 Major depressive disorder, single episode, unspecified: Secondary | ICD-10-CM

## 2015-09-26 DIAGNOSIS — R413 Other amnesia: Secondary | ICD-10-CM | POA: Diagnosis not present

## 2015-09-26 DIAGNOSIS — E1142 Type 2 diabetes mellitus with diabetic polyneuropathy: Secondary | ICD-10-CM | POA: Diagnosis not present

## 2015-09-26 DIAGNOSIS — I10 Essential (primary) hypertension: Secondary | ICD-10-CM

## 2015-09-26 NOTE — Progress Notes (Signed)
Patient ID: Zachary Harris, male   DOB: [birth year], 81 y.o.   MRN: [id number]    [facility]  Nursing Home Room Number: N 9  Place of Service: SNF (31)     Allergies  Allergen Reactions  . Hydrocodone     hallucination  . Tramadol     hallucination    Chief Complaint  Patient presents with  . Medical Management of Chronic Issues    HPI:  Overall, patient is doing very well at this time. He has lost a little bit more weight. He is moving around with assistance. He says his pains are better. Daughter is present and agrees that things seem to be going better.  Contusion of right lower leg, subsequent encounter - currently little over a week ago and is doing better. Denies pain.  Chronic combined systolic and diastolic CHF (congestive heart failure) (HCC) - compensated and stable  Essential hypertension - controlled on present medication  Failure to thrive in adult - patient is doing much better. He is more interactive with staff. He is gaining a little weight.  Memory loss - unchanged  DM type 2 with diabetic peripheral neuropathy (HCC)  - controlled  Depression - Improved    Medications: Patient's Medications  New Prescriptions   No medications on file  Previous Medications   ACETAMINOPHEN (TYLENOL) 500 MG TABLET    Take 2 tablets (1,000 mg total) by mouth 2 (two) times daily.   FEEDING SUPPLEMENT, GLUCERNA SHAKE, (GLUCERNA SHAKE) LIQD    Take 237 mLs by mouth 2 (two) times daily between meals.   FENTANYL (DURAGESIC) 25 MCG/HR PATCH    Apply fresh patch every 3 days to help pain. Remove old patch.   LACTULOSE (CHRONULAC) 10 GM/15ML SOLUTION    Take 20 g by mouth every other day. Every other day as needed   NITROGLYCERIN (NITROSTAT) 0.4 MG SL TABLET    Place 0.4 mg under the tongue every 5 (five) minutes as needed for chest pain.  Modified Medications   No medications on file  Discontinued Medications   No medications on file     Review of Systems    Constitutional: Negative for fever and chills.  HENT: Positive for hearing loss. Negative for ear pain.   Eyes: Negative for photophobia and pain.       Corrective lenses  Respiratory: Positive for shortness of breath. Negative for cough and wheezing.   Cardiovascular: Positive for leg swelling.       Pacemaker. History of syncope related to cardiac arrhythmia. Right lower leg swelling, redness, pain with weight bearing  Gastrointestinal: Negative for abdominal pain.  Endocrine: Negative for polydipsia.  Genitourinary: Positive for urgency and frequency. Negative for dysuria and flank pain.       Underwent Urology evaluation in the past, off meds. Episodes of incontinence persist.  Musculoskeletal: Positive for myalgias, back pain and neck pain (Fall on 03/15/15 causing pain in the left knee and effusion.).       Poor balance and gait disturbance. Lower back pain Sever kyphosis  Skin:       Rough area on left cheek R+L buttocks, groins, sacrum excoriated areas are much improved Limited improvement 1/3 lower RLE redness, warmth, swelling, and pain with weight bearing   Neurological: Negative for tremors, seizures and weakness.       Memory loss  Psychiatric/Behavioral:       Mild to moderate memory disturbance.    Filed Vitals:   09/26/15 1502  BP: 135/75  Pulse: 74  Temp: 98.7 F (37.1 C)  Resp: 16  Height: $Remove'5\' 9"'uCoXyxp$  (1.753 m)  Weight: 161 lb (73.029 kg)  SpO2: 98%   Body mass index is 23.76 kg/(m^2).  Physical Exam  Constitutional: He is oriented to person, place, and time.  Frail  HENT:  Head: Normocephalic and atraumatic.  Nose: Nose normal.  Bilateral hearing aids.  Eyes: Conjunctivae are normal. Pupils are equal, round, and reactive to light.  Prescription lenses.  Neck: Normal range of motion. Neck supple. No JVD present. No thyromegaly present.  Cardiovascular: Normal rate and regular rhythm.  Exam reveals no gallop and no friction rub.   Murmur heard.  Systolic  murmur is present with a grade of 1/6  Absent DP and PT  Pulmonary/Chest: Effort normal and breath sounds normal. He has no wheezes. He has no rales. He exhibits no tenderness.  Using portable oxygen at 2 L/m today.  Abdominal: Soft. Bowel sounds are normal. He exhibits no distension. There is no tenderness.  Musculoskeletal: Normal range of motion. He exhibits edema. He exhibits no tenderness.  Pain in the left SI joint area. Limps when walking. Favors the left side. Effusion of the left knee. Scars in the left knee from previous surgery. Tender at the left knee with movement of the patella. Positional lower back pain Severe kyphosis Lower 1/3 RLE redness, warmth, tenderness with weight bearing, swelling.   Neurological: He is alert and oriented to person, place, and time. He has normal strength. A sensory deficit is present. No cranial nerve deficit. Gait abnormal.  07/13/14 MMSE 28/30. Passed clock drawing. Diminshed sensation to vabration and monofilament.  Skin: Skin is warm and dry. No erythema. No pallor.  Rough area on the left cheek. Cystic lesion in the mid forehead. Mid sternal surgical scar. Left upper chest pacemaker. R+L buttocks, groins, sacrum excoriated areas are improved Limited improvement 1/3 lower RLE redness, warmth, swelling, and pain with weight bearing.   Psychiatric: He has a normal mood and affect. His behavior is normal. Thought content normal.  Patient is in denial regarding the extent of his memory deficit.     Labs reviewed: Lab Summary Latest Ref Rng 08/30/2015 08/17/2015 06/06/2015  Hemoglobin 13.5 - 17.5 g/dL 11.1(A) 11.3(A) 12.0(A)  Hematocrit 41 - 53 % 34(A) 34(A) 37(A)  White count - 8.2 10.5 8.7  Platelet count 150 - 399 K/L 239 144(A) 264  Sodium 137 - 147 mmol/L 137 136(A) 136(A)  Potassium 3.4 - 5.3 mmol/L 4.4 4.0 4.4  Calcium - (None) (None) (None)  Phosphorus - (None) (None) (None)  Creatinine 0.6 - 1.3 mg/dL 0.7 0.7 1.0  AST 14 - 40 U/L $Remo'17 25  27  'CglAY$ Alk Phos 25 - 125 U/L 190(A) 156(A) 162(A)  Bilirubin - (None) (None) (None)  Glucose - 115 148 137  Cholesterol - (None) (None) (None)  HDL cholesterol - (None) (None) (None)  Triglycerides - (None) (None) (None)  LDL Direct - (None) (None) (None)  LDL Calc - (None) (None) (None)  Total protein - (None) (None) (None)  Albumin - (None) (None) (None)   Lab Results  Component Value Date   TSH 3.16 06/06/2015   Lab Results  Component Value Date   BUN 25* 08/30/2015   Lab Results  Component Value Date   HGBA1C 6.7* 06/06/2015       Assessment/Plan.diagm 1. Contusion of right lower leg, subsequent encounter Improving  2. Chronic combined systolic and diastolic CHF (congestive heart failure) ([facility]) Controlled  3. Essential hypertension Controlled  4. Failure to thrive in adult Improved  5. Memory loss Stable  6. DM type 2 with diabetic peripheral neuropathy (HCC) Unchanged  7. Depression Improved

## 2015-09-27 ENCOUNTER — Encounter: Payer: Self-pay | Admitting: Nurse Practitioner

## 2015-09-27 DIAGNOSIS — R413 Other amnesia: Secondary | ICD-10-CM

## 2015-10-25 ENCOUNTER — Encounter: Payer: Self-pay | Admitting: *Deleted

## 2015-10-31 ENCOUNTER — Ambulatory Visit (INDEPENDENT_AMBULATORY_CARE_PROVIDER_SITE_OTHER): Payer: Medicare Other | Admitting: Internal Medicine

## 2015-10-31 ENCOUNTER — Encounter: Payer: Self-pay | Admitting: Internal Medicine

## 2015-10-31 VITALS — BP 110/60 | HR 81 | Ht 69.5 in | Wt 166.0 lb

## 2015-10-31 DIAGNOSIS — Z950 Presence of cardiac pacemaker: Secondary | ICD-10-CM | POA: Diagnosis not present

## 2015-10-31 DIAGNOSIS — I480 Paroxysmal atrial fibrillation: Secondary | ICD-10-CM

## 2015-10-31 DIAGNOSIS — I739 Peripheral vascular disease, unspecified: Secondary | ICD-10-CM

## 2015-10-31 NOTE — Progress Notes (Signed)
Patient Care Team: Estill Dooms, MD as PCP - General (Internal Medicine) Irine Seal, MD as Consulting Physician (Urology) Renella Cunas, MD as Consulting Physician (Cardiology) [facility] Lelon Perla, MD as Consulting Physician (Cardiology) Man Mast Rhunette Croft, NP as Nurse Practitioner (Nurse Practitioner) Rigoberto Noel, MD as Consulting Physician (Pulmonary Disease)   HPI  Zachary Harris is a 81 y.o. male Seen in followup for pacemaker implanted for high-grade heart block and recurrent syncope. He also had orthostatic hypotension potentially contributing to his syncope, this potentially associated with diabetic autonomic neuropathy    His history of ischemic heart disease with prior bypass grafting and depressed left ventricular function with an EF of 40-45%. He was hospitalized earlier this month for dyspnea that had been progressive. He underwent IV diuresis that was complicated by worsening of his renal function. Echocardiogram at that time had demonstrated no change in ejection fraction  Echocardiogram 10/15 demonstrated EF 45% there was mild aortic stenosis mild mitral regurgitation and significant left atrial enlargement  Electrogram evaluation has demonstrated some atrial fibrillation. He is not on anticoagulation.  He has been at assisted living at [facility] for the last 5 months after diagnosis of failure to thrive. His functional status has improved. All of his medications were discontinued apart from the fentanyl patch upon admission.  He denies chest pain or shortness of breath when he is nonambulatory. He has asymmetric swelling. Review of the records from [facility] [place] refers to a venous Doppler negative for DVT  Past Medical History  Diagnosis Date  . ANEMIA-IRON DEFICIENCY 05/27/2007  . ANXIETY 11/14/2007  . Intermittent high-grade heart block 09/05/2009    s/p pacemaker  . BENIGN PROSTATIC HYPERTROPHY 11/14/2007  . CAROTID ARTERY DISEASE  10/02/2010  . Pacemaker-St. Jude 02/02/2008    St. Jude  . COLONIC POLYPS, HX OF 05/27/2007  . CORONARY ARTERY DISEASE 11/14/2007    prior bypass [phone]; Inferior MI 11/07; Severe 3 vessel disease; occluded SVG to RCA; SVG to OM subtotalled, SVG to diagonal with ostial 70 and 80 mid; EF 45. PCI of SVG to OM. FU myovue showed EF 56 with inferior infarct and very mild peri-infarct ischemia  . DEPRESSION 11/14/2007  . DIABETES MELLITUS, TYPE II 11/14/2007  . DIVERTICULITIS, HX OF 05/27/2007  . DVT (deep venous thrombosis) ([place]) 03/21/2011  . FOOT PAIN, BILATERAL 12/08/2010  . GERD 11/14/2007  . HYPERLIPIDEMIA 11/14/2007  . HYPERTENSION 11/14/2007  . HYPOTENSION, ORTHOSTATIC 03/06/2011  . Memory loss 06/01/2008  . OSTEOPOROSIS 06/02/2010  . PERIPHERAL VASCULAR DISEASE 11/14/2007  . RENAL INSUFFICIENCY 11/14/2007  . SECONDARY DM W/PERIPHERAL CIRC D/O UNCONTROLLED 11/07/2010  . SYNCOPE 09/08/2008  . Unspecified hearing loss 05/13/2009  . Prostate cancer ([place])   . Myalgia and myositis, unspecified 04/24/2011  . Other malaise and fatigue 04/24/2011  . Peripheral neuropathy ([place]) 03/27/2011  . Seborrheic keratosis 06/05/2011  . Osteoarthrosis, unspecified whether generalized or localized, unspecified site   . Ischemic cardiomyopathy     a. echo (10/15):  mod focal basal septal hypertrophy, EF 40-45%, inf AK, Gr 1 DD, Al sclerosis, mild AI, MAC, mild MR, mod LAE, lipomatous hypertrophy, small effusion (no hemodynamic compromise)   . Effusion of left knee 03/17/2015  . Severe back pain   . DM (diabetes mellitus), type 2 with renal complications ([place]) 03/06/9701  . Failure to thrive in adult 05/03/2015  . Chronic combined systolic and diastolic CHF (congestive heart failure) ([place]) 10/10/2014  . CKD stage  3 due to type 1 diabetes mellitus ([place]) 03/17/2015  . Dyspnea 11/09/2014  . Hearing loss 05/13/2009    Qualifier: Diagnosis of  By: Jenny Reichmann MD, Hunt Oris   . Hyperlipemia 11/14/2007    Qualifier: Diagnosis of   By: Jenny Reichmann MD, Hunt Oris    . Generalized weakness 05/20/2015  . Protein-calorie malnutrition, severe ([place]) 05/23/2015  . Vertebral compression fracture ([place]) 09/05/2009    Qualifier: Diagnosis of  By: Jenny Reichmann MD, Hunt Oris   . [name] 04/04/2015  . Weight loss 10/10/2014    Past Surgical History  Procedure Laterality Date  . S/p coronary stent x 1    . Cholecystectomy    . S/p bilat knee replacement  Cleon Gustin, MD  . Pacemaker insertion  2012    Caryl Comes, MD  . Prostate needle biopsy    . Cystoscopy    . Appendectomy    . Coronary artery bypass graft  2007    Current Outpatient Prescriptions  Medication Sig Dispense Refill  . acetaminophen (TYLENOL) 500 MG tablet Take 2 tablets (1,000 mg total) by mouth 2 (two) times daily. 30 tablet 0  . feeding supplement, GLUCERNA SHAKE, (GLUCERNA SHAKE) LIQD Take 237 mLs by mouth 2 (two) times daily between meals. 20 Can 0  . fentaNYL (DURAGESIC) 25 MCG/HR patch Apply fresh patch every 3 days to help pain. Remove old patch. 10 patch 0  . lactulose (CHRONULAC) 10 GM/15ML solution Take 20 g by mouth every other day. Every other day as needed    . nitroGLYCERIN (NITROSTAT) 0.4 MG SL tablet Place 0.4 mg under the tongue every 5 (five) minutes as needed for chest pain.     No current facility-administered medications for this visit.    Allergies  Allergen Reactions  . Hydrocodone     hallucination  . Tramadol     hallucination    Review of Systems negative except from HPI and PMH  Physical Exam BP 110/60 mmHg  Pulse 81  Ht 5' 9.5" (1.765 m)  Wt 166 lb (75.297 kg)  BMI 24.17 kg/m2 Well developed and well nourished in no acute distress HENT normal E scleral and icterus clear Neck Supple JVP flat;   Clear to ausculation  Device pocket well healed; without hematoma or erythema.  There is no tethering Regular rate and rhythm, 2/6 murmur radiating to the apexgallops or rub No clubbing cyanosislright greater than left 2+ edema Alert and oriented,  grossly normal motor and sensory function Skin Warm and Dry     Assessment and  Plan  Renal insufficiency  CAD s/p CABG   Atrial fibrillation  Syncope  Complete heart block-intermittent  Pacemaker-St. Jude  Debility  The patient has had atrial fibrillation detected on his device of 4 hours duration. It would be reasonable to consider reintroduction of anticoagulation I will defer this to Dr. Stanford Breed   He is also not on aspirin and niacin suggested that they had a discussion with Dr. Nyoka Cowden regarding aspirin  Pacemaker function is normal  There have been no intercurrent syncope

## 2015-11-01 LAB — CUP PACEART INCLINIC DEVICE CHECK
Battery Voltage: 2.95 V
Brady Statistic RA Percent Paced: 2.5 %
Brady Statistic RV Percent Paced: 25 %
Date Time Interrogation Session: 20161107214216
Implantable Lead Implant Date: 20120326
Implantable Lead Location: 753859
Implantable Lead Location: 753860
Lead Channel Impedance Value: 400 Ohm
Lead Channel Pacing Threshold Amplitude: 0.75 V
Lead Channel Pacing Threshold Amplitude: 0.75 V
Lead Channel Sensing Intrinsic Amplitude: 2.5 mV
Lead Channel Sensing Intrinsic Amplitude: 8.6 mV
Lead Channel Setting Pacing Amplitude: 2.5 V
Lead Channel Setting Pacing Pulse Width: 0.4 ms
MDC IDC LEAD IMPLANT DT: 20120326
MDC IDC MSMT BATTERY REMAINING LONGEVITY: 123.6
MDC IDC MSMT LEADCHNL RA PACING THRESHOLD PULSEWIDTH: 0.4 ms
MDC IDC MSMT LEADCHNL RV IMPEDANCE VALUE: 512.5 Ohm
MDC IDC MSMT LEADCHNL RV PACING THRESHOLD PULSEWIDTH: 0.4 ms
MDC IDC PG SERIAL: 7228315
MDC IDC SET LEADCHNL RA PACING AMPLITUDE: 2 V
MDC IDC SET LEADCHNL RV SENSING SENSITIVITY: 2 mV
Pulse Gen Model: 2210

## 2015-11-03 ENCOUNTER — Encounter: Payer: Self-pay | Admitting: Internal Medicine

## 2015-11-08 ENCOUNTER — Encounter: Payer: Self-pay | Admitting: Nurse Practitioner

## 2015-11-08 ENCOUNTER — Non-Acute Institutional Stay (SKILLED_NURSING_FACILITY): Payer: Medicare Other | Admitting: Nurse Practitioner

## 2015-11-08 ENCOUNTER — Encounter: Payer: Self-pay | Admitting: Internal Medicine

## 2015-11-08 DIAGNOSIS — K5900 Constipation, unspecified: Secondary | ICD-10-CM | POA: Diagnosis not present

## 2015-11-08 DIAGNOSIS — R413 Other amnesia: Secondary | ICD-10-CM

## 2015-11-08 DIAGNOSIS — M5440 Lumbago with sciatica, unspecified side: Secondary | ICD-10-CM

## 2015-11-08 DIAGNOSIS — E1142 Type 2 diabetes mellitus with diabetic polyneuropathy: Secondary | ICD-10-CM | POA: Diagnosis not present

## 2015-11-08 DIAGNOSIS — I480 Paroxysmal atrial fibrillation: Secondary | ICD-10-CM

## 2015-11-08 DIAGNOSIS — I10 Essential (primary) hypertension: Secondary | ICD-10-CM

## 2015-11-08 DIAGNOSIS — I5042 Chronic combined systolic (congestive) and diastolic (congestive) heart failure: Secondary | ICD-10-CM | POA: Diagnosis not present

## 2015-11-08 NOTE — Progress Notes (Signed)
Patient ID: Zachary Harris, male   DOB: Jun 09, 1923, 81 y.o.   MRN: [id number]  Location:  SNF [facility] Provider:  Marlana Latus NP  Code Status:  DNR Goals of care: Advanced Directive information    Chief Complaint  Patient presents with  . Medical Management of Chronic Issues     HPI: Patient is Harris 81 y.o. male seen in the SNF at [facility] today for evaluation of RLE edema, Afib, heart rate is in control, Dr. Caryl Comes 09/30/15 deferred to Dr. Stanford Breed to consider reintroduction of anticoagulation.   Review of Systems  Constitutional: Negative for fever and chills.  HENT: Positive for hearing loss. Negative for ear pain.   Eyes: Negative for photophobia and pain.       Corrective lenses  Respiratory: Positive for shortness of breath. Negative for cough and wheezing.   Cardiovascular: Positive for leg swelling.       Pacemaker. History of syncope related to cardiac arrhythmia. Right lower leg edema 1+  Gastrointestinal: Negative for abdominal pain.  Genitourinary: Positive for urgency and frequency. Negative for dysuria and flank pain.       Underwent Urology evaluation in the past, off meds. Episodes of incontinence persist.  Musculoskeletal: Positive for myalgias, back pain and neck pain (Fall on 03/15/15 causing pain in the left knee and effusion.).       Poor balance and gait disturbance. Lower back pain Sever kyphosis  Skin:       Rough area on left cheek R+L buttocks, groins, sacrum excoriated areas are much improved Limited improvement 1/3 lower RLE redness, warmth, swelling, and pain with weight bearing   Neurological: Negative for tremors, seizures and weakness.       Memory loss  Endo/Heme/Allergies: Negative for polydipsia.  Psychiatric/Behavioral:       Mild to moderate memory disturbance.    Past Medical History  Diagnosis Date  . ANEMIA-IRON DEFICIENCY 05/27/2007  . ANXIETY 11/14/2007  . Intermittent high-grade heart block 09/05/2009    s/p pacemaker  . BENIGN  PROSTATIC HYPERTROPHY 11/14/2007  . CAROTID ARTERY DISEASE 10/02/2010  . Pacemaker-St. Jude 02/02/2008    St. Jude  . COLONIC POLYPS, HX OF 05/27/2007  . CORONARY ARTERY DISEASE 11/14/2007    prior bypass [phone]; Inferior MI 11/07; Severe 3 vessel disease; occluded SVG to RCA; SVG to OM subtotalled, SVG to diagonal with ostial 70 and 80 mid; EF 45. PCI of SVG to OM. FU myovue showed EF 56 with inferior infarct and very mild peri-infarct ischemia  . DEPRESSION 11/14/2007  . DIABETES MELLITUS, TYPE II 11/14/2007  . DIVERTICULITIS, HX OF 05/27/2007  . DVT (deep venous thrombosis) ([place]) 03/21/2011  . FOOT PAIN, BILATERAL 12/08/2010  . GERD 11/14/2007  . HYPERLIPIDEMIA 11/14/2007  . HYPERTENSION 11/14/2007  . HYPOTENSION, ORTHOSTATIC 03/06/2011  . Memory loss 06/01/2008  . OSTEOPOROSIS 06/02/2010  . PERIPHERAL VASCULAR DISEASE 11/14/2007  . RENAL INSUFFICIENCY 11/14/2007  . SECONDARY DM W/PERIPHERAL CIRC D/O UNCONTROLLED 11/07/2010  . SYNCOPE 09/08/2008  . Unspecified hearing loss 05/13/2009  . Prostate cancer (Zachary Harris)   . Myalgia and myositis, unspecified 04/24/2011  . Other malaise and fatigue 04/24/2011  . Peripheral neuropathy ([place]) 03/27/2011  . Seborrheic keratosis 06/05/2011  . Osteoarthrosis, unspecified whether generalized or localized, unspecified site   . Ischemic cardiomyopathy     Harris. echo (10/15):  mod focal basal septal hypertrophy, EF 40-45%, inf AK, Gr 1 DD, Al sclerosis, mild AI, MAC, mild MR, mod LAE, lipomatous hypertrophy, small effusion (no hemodynamic  compromise)   . Effusion of left knee 03/17/2015  . Severe back pain   . DM (diabetes mellitus), type 2 with renal complications ([place]) [id number]  . Failure to thrive in adult 05/03/2015  . Chronic combined systolic and diastolic CHF (congestive heart failure) ([place]) 10/10/2014  . CKD stage 3 due to type 1 diabetes mellitus (Zachary Harris) 03/17/2015  . Dyspnea 11/09/2014  . Hearing loss 05/13/2009    Qualifier: Diagnosis of  By: Jenny Reichmann MD, Hunt Oris    . Hyperlipemia 11/14/2007    Qualifier: Diagnosis of  By: Jenny Reichmann MD, Hunt Oris    . Generalized weakness 05/20/2015  . Protein-calorie malnutrition, severe ([place]) 05/23/2015  . Vertebral compression fracture (Zachary Harris) 09/05/2009    Qualifier: Diagnosis of  By: Jenny Reichmann MD, Hunt Oris   . Zachary Harris 04/04/2015  . Weight loss 10/10/2014    Patient Active Problem List   Diagnosis Date Noted  . Contusion of right lower leg 09/16/2015  . Cellulitis of leg, right 09/13/2015  . Fever presenting with conditions classified elsewhere 08/16/2015  . Candidiasis of skin 06/07/2015  . Constipation 05/27/2015  . Protein-calorie malnutrition, severe (Zachary Harris) 05/23/2015  . Palliative care encounter   . Compression fracture 05/20/2015  . Generalized weakness 05/20/2015  . Dehydration 05/20/2015  . DM (diabetes mellitus), type 2 with renal complications (Zachary Harris) [id number]  . Failure to thrive in adult 05/03/2015  . Weak 04/04/2015  . CKD stage 3 due to type 1 diabetes mellitus (Zachary Harris) 03/17/2015  . Chronic respiratory failure ([place]) 01/13/2015  . Dyspnea 11/09/2014  . Paroxysmal atrial fibrillation (Zachary Harris) 10/25/2014  . Ischemic cardiomyopathy 10/10/2014  . Chronic combined systolic and diastolic CHF (congestive heart failure) (Zachary Harris) 10/10/2014  . Weight loss 10/10/2014  . Other malaise and fatigue 08/16/2013  . Infection of urinary tract 08/16/2013  . Urine retention 08/16/2013  . Urge incontinence 08/16/2013  . Nocturia 08/16/2013  . Nodular prostate with urinary obstruction 08/16/2013  . DM type 2 with diabetic peripheral neuropathy ([place]) 04/28/2013  . Malignant neoplasm of prostate ([place]) 02/25/2013  . HYPOTENSION, ORTHOSTATIC 03/06/2011  . Osteoporosis 06/02/2010  . Backache 09/05/2009  . Vertebral compression fracture (Zachary Harris) 09/05/2009  . Intermittent high-grade heart block 09/05/2009  . Hearing loss 05/13/2009  . Memory loss 06/01/2008  . Pacemaker-St.Jude 02/02/2008  . Hyperlipemia 11/14/2007  . ANXIETY 11/14/2007    . Depression 11/14/2007  . Essential hypertension 11/14/2007  . MYOCARDIAL INFARCTION, HX OF 11/14/2007  . CAD (coronary artery disease) 11/14/2007  . Peripheral vascular disease ([place]) 11/14/2007  . GERD 11/14/2007  . Iron deficiency anemia 05/27/2007  . COLONIC POLYPS, HX OF 05/27/2007    Allergies  Allergen Reactions  . Hydrocodone     hallucination  . Tramadol     hallucination    Medications: Patient's Medications  Zachary Prescriptions   No medications on file  Previous Medications   ACETAMINOPHEN (TYLENOL) 500 MG TABLET    Take 2 tablets (1,000 mg total) by mouth 2 (two) times daily.   FEEDING SUPPLEMENT, GLUCERNA SHAKE, (GLUCERNA SHAKE) LIQD    Take 237 mLs by mouth 2 (two) times daily between meals.   FENTANYL (DURAGESIC) 25 MCG/HR PATCH    Apply fresh patch every 3 days to help pain. Remove old patch.   LACTULOSE (CHRONULAC) 10 GM/15ML SOLUTION    Take 20 g by mouth every other day. Every other day as needed   NITROGLYCERIN (NITROSTAT) 0.4 MG SL TABLET    Place 0.4 mg under the tongue every 5 (five) minutes as  needed for chest pain.  Modified Medications   No medications on file  Discontinued Medications   No medications on file    Physical Exam: Filed Vitals:   11/08/15 1234  BP: 132/74  Pulse: 80  Temp: 97.6 F (36.4 C)  TempSrc: Tympanic  Resp: 20   There is no weight on file to calculate BMI.  Physical Exam  Constitutional: He is oriented to person, place, and time.  Frail  HENT:  Head: Normocephalic and atraumatic.  Nose: Nose normal.  Bilateral hearing aids.  Eyes: Conjunctivae are normal. Pupils are equal, round, and reactive to light.  Prescription lenses.  Neck: Normal range of motion. Neck supple. No JVD present. No thyromegaly present.  Cardiovascular: Normal rate and regular rhythm.  Exam reveals no gallop and no friction rub.   Murmur heard.  Systolic murmur is present with Harris grade of 1/6  Absent DP and PT  Pulmonary/Chest: Effort normal  and breath sounds normal. He has no wheezes. He has no rales. He exhibits no tenderness.  Using portable oxygen at 2 L/m today.  Abdominal: Soft. Bowel sounds are normal. He exhibits no distension. There is no tenderness.  Musculoskeletal: Normal range of motion. He exhibits edema. He exhibits no tenderness.  Pain in the left SI joint area. Limps when walking. Favors the left side. Effusion of the left knee. Scars in the left knee from previous surgery. Tender at the left knee with movement of the patella. Positional lower back pain Server kyphosis RLE edema 1+  Neurological: He is alert and oriented to person, place, and time. He has normal strength. Harris sensory deficit is present. No cranial nerve deficit. Gait abnormal.  07/13/14 MMSE 28/30. Passed clock drawing. Diminshed sensation to vabration and monofilament.  Skin: Skin is warm and dry. No erythema. No pallor.  Rough area on the left cheek. Cystic lesion in the mid forehead. Mid sternal surgical scar. Left upper chest pacemaker. R+L buttocks, groins, sacrum excoriated areas are improved Limited improvement 1/3 lower RLE redness, warmth, swelling, and pain with weight bearing.     Psychiatric: He has Harris normal mood and affect. His behavior is normal. Thought content normal.  Patient is in denial regarding the extent of his memory deficit.    Labs reviewed: Basic Metabolic Panel:  Recent Labs  05/20/15 1341 05/21/15 0523 05/22/15 0533 06/06/15 08/17/15 08/30/15  NA 137 136 135 136* 136* 137  K 3.8 3.4* 4.4 4.4 4.0 4.4  CL 104 104 107  --   --   --   CO2 23 25 21*  --   --   --   GLUCOSE 149* 137* 126*  --   --   --   BUN 64* 57* 33* 32* 37* 25*  CREATININE 1.67* 1.47* 0.92 1.0 0.7 0.7  CALCIUM 9.5 8.8* 8.6*  --   --   --     Liver Function Tests:  Recent Labs  05/03/15 1040  05/20/15 1341 06/06/15 08/17/15 08/30/15  AST 21  < > 35 27 25 17   ALT 11  < > 19 20 16  8*  ALKPHOS 66  < > 122 162* 156* 190*  BILITOT 0.8  --   0.9  --   --   --   PROT 7.1  --  6.8  --   --   --   ALBUMIN 3.9  --  3.1*  --   --   --   < > = values in this interval not  displayed.  CBC:  Recent Labs  05/03/15 1040 05/20/15 1341 05/21/15 0523 05/22/15 0533 06/06/15 08/17/15 08/30/15  WBC 9.6 8.3 6.3 5.4 8.7 10.5 8.2  NEUTROABS 6.8 5.9  --   --   --   --   --   HGB  --  13.1 11.5* 11.9* 12.0* 11.3* 11.1*  HCT 42.1 39.0 34.6* 35.3* 37* 34* 34*  MCV  --  90.9 90.1 90.1  --   --   --   PLT  --  182 147* 126* 264 144* 239    Lab Results  Component Value Date   TSH 3.16 06/06/2015   Lab Results  Component Value Date   HGBA1C 6.7* 06/06/2015   Lab Results  Component Value Date   CHOL 132 02/07/2015   HDL 33* 02/07/2015   LDLCALC 76 02/07/2015   LDLDIRECT 85.6 07/19/2010   TRIG 115 02/07/2015   CHOLHDL 4 03/06/2011    Significant Diagnostic Results since last visit: none  Patient Care Team: Estill Dooms, MD as PCP - General (Internal Medicine) Irine Seal, MD as Consulting Physician (Urology) Renella Cunas, MD as Consulting Physician (Cardiology) [facility] Lelon Perla, MD as Consulting Physician (Cardiology) Anelise Staron X, NP as Nurse Practitioner (Nurse Practitioner) Rigoberto Noel, MD as Consulting Physician (Pulmonary Disease)  Assessment/Plan Problem List Items Addressed This Visit    Essential hypertension - Primary (Chronic)    Controlled, off meds.       Backache (Chronic)    Etiology of the back pain. Continue Fentanyl and Tylenol for pain control. Activity as tolerated.       Memory loss (Chronic)    Progressing. off Namenda R>B. SNF for care needs. Comfort measures.       DM type 2 with diabetic peripheral neuropathy (HCC) (Chronic)    06/06/15 Hgb A1c 6.7 diet control.       Chronic combined systolic and diastolic CHF (congestive heart failure) (HCC) (Chronic)    O2 dependent, DOE apparent, activity as tolerated, Hospice service and comfort care measures.       Paroxysmal  atrial fibrillation (HCC)    Rate is in control w/o medications.      Constipation    Stable, continue 06/10/15 Lactulose prn.          Family/ staff Communication: observe the RLE. Continue Hospice Service.   Labs/tests ordered: none  ManXie Xoe Hoe NP [facility] [address], [place] [address] On Call:  [phone] & follow prompts after 5pm & weekends Office Phone:  [phone] Office Fax:  [phone]

## 2015-11-08 NOTE — Assessment & Plan Note (Signed)
O2 dependent, DOE apparent, activity as tolerated, Hospice service and comfort care measures.     

## 2015-11-08 NOTE — Assessment & Plan Note (Signed)
Rate is in control w/o medications.    

## 2015-11-08 NOTE — Assessment & Plan Note (Signed)
Progressing. off Namenda R>B. SNF for care needs. Comfort measures.   

## 2015-11-08 NOTE — Assessment & Plan Note (Signed)
06/06/15 Hgb A1c 6.7 diet control.   

## 2015-11-08 NOTE — Assessment & Plan Note (Signed)
Controlled, off meds.  

## 2015-11-08 NOTE — Assessment & Plan Note (Signed)
Stable, continue 06/10/15 Lactulose prn. 

## 2015-11-08 NOTE — Assessment & Plan Note (Signed)
Etiology of the back pain. Continue Fentanyl and Tylenol for pain control. Activity as tolerated.    

## 2015-11-10 ENCOUNTER — Telehealth: Payer: Self-pay | Admitting: Cardiology

## 2015-11-10 NOTE — Telephone Encounter (Signed)
Spoke with pt dtr, she reports when the pt saw dr Caryl Comes that there was atrial fib and he was goning to have dr Stanford Breed decide if he needed to be restarted on xarelto. There has been no order sent [facility]. Do not see any notes regarding this from dr Stanford Breed Aware will forward to dr Stanford Breed for his review and then let her know.

## 2015-11-10 NOTE — Telephone Encounter (Signed)
[facility] was on hospice at [facility] from recent hospitalization. Will need ov to discuss xarelto if they want to restart Kirk [facility]s

## 2015-11-10 NOTE — Telephone Encounter (Signed)
[name] is calling to discuss her father's medication . Please call . Thanks

## 2015-11-10 NOTE — Telephone Encounter (Signed)
Spoke with pt dtr, Aware of dr Jacalyn Lefevre recommendations.  Follow up scheduled

## 2015-12-01 NOTE — Progress Notes (Signed)
HPI: FU CAD. Patient had CABG 1997. Abd ultrasound 8/07 showed no aneurysm. Inferior MI 11/07; Severe 3 vessel disease; occluded SVG to RCA; SVG to OM subtotalled, SVG to diagonal with ostial 70 and 80 mid; EF 45. PCI of SVG to OM. FU myovue showed EF 56 with inferior infarct and very mild peri-infarct ischemia. ABI 5/12-left mildly abnormal; normal right. Carotid dopplers 12/14 showed 1-39% bilateral stenosis. Last echocardiogram October 2015 showed an ejection fraction of 40-45%. There is grade 1 diastolic dysfunction. There was mild aortic and mitral regurgitation. There was a small pericardial effusion and moderate left atrial enlargement. Also s/p pacemaker. Previously noted to have atrial fibrillation on his device. Patient apparently recently had medications discontinued because of failure to thrive. Since last seen, The patient is residing at friend's home [place]. He apparently has regained some strength but requires assistance to ambulate. He has fallen previously. He denies dyspnea, chest pain, palpitations or syncope. He does have pedal edema.  Current Outpatient Prescriptions  Medication Sig Dispense Refill  . acetaminophen (TYLENOL) 500 MG tablet Take 2 tablets (1,000 mg total) by mouth 2 (two) times daily. 30 tablet 0  . feeding supplement, GLUCERNA SHAKE, (GLUCERNA SHAKE) LIQD Take 237 mLs by mouth 2 (two) times daily between meals. 20 Can 0  . fentaNYL (DURAGESIC) 25 MCG/HR patch Apply fresh patch every 3 days to help pain. Remove old patch. 10 patch 0  . lactulose (CHRONULAC) 10 GM/15ML solution Take 20 g by mouth every other day. Every other day as needed    . nitroGLYCERIN (NITROSTAT) 0.4 MG SL tablet Place 0.4 mg under the tongue every 5 (five) minutes as needed for chest pain.    . furosemide (LASIX) 20 MG tablet Take 1 tablet (20 mg total) by mouth daily. 30 tablet 11   No current facility-administered medications for this visit.     Past Medical History  Diagnosis Date    . ANEMIA-IRON DEFICIENCY 05/27/2007  . ANXIETY 11/14/2007  . Intermittent high-grade heart block 09/05/2009    s/p pacemaker  . BENIGN PROSTATIC HYPERTROPHY 11/14/2007  . CAROTID ARTERY DISEASE 10/02/2010  . Pacemaker-St. Jude 02/02/2008    St. Jude  . COLONIC POLYPS, HX OF 05/27/2007  . CORONARY ARTERY DISEASE 11/14/2007    prior bypass [phone]; Inferior MI 11/07; Severe 3 vessel disease; occluded SVG to RCA; SVG to OM subtotalled, SVG to diagonal with ostial 70 and 80 mid; EF 45. PCI of SVG to OM. FU myovue showed EF 56 with inferior infarct and very mild peri-infarct ischemia  . DEPRESSION 11/14/2007  . DIABETES MELLITUS, TYPE II 11/14/2007  . DIVERTICULITIS, HX OF 05/27/2007  . DVT (deep venous thrombosis) ([place]) 03/21/2011  . FOOT PAIN, BILATERAL 12/08/2010  . GERD 11/14/2007  . HYPERLIPIDEMIA 11/14/2007  . HYPERTENSION 11/14/2007  . HYPOTENSION, ORTHOSTATIC 03/06/2011  . Memory loss 06/01/2008  . OSTEOPOROSIS 06/02/2010  . PERIPHERAL VASCULAR DISEASE 11/14/2007  . RENAL INSUFFICIENCY 11/14/2007  . SECONDARY DM W/PERIPHERAL CIRC D/O UNCONTROLLED 11/07/2010  . SYNCOPE 09/08/2008  . Unspecified hearing loss 05/13/2009  . Prostate cancer ([place])   . Myalgia and myositis, unspecified 04/24/2011  . Other malaise and fatigue 04/24/2011  . Peripheral neuropathy ([place]) 03/27/2011  . Seborrheic keratosis 06/05/2011  . Osteoarthrosis, unspecified whether generalized or localized, unspecified site   . Ischemic cardiomyopathy     a. echo (10/15):  mod focal basal septal hypertrophy, EF 40-45%, inf AK, Gr 1 DD, Al sclerosis, mild AI, MAC, mild MR, mod  LAE, lipomatous hypertrophy, small effusion (no hemodynamic compromise)   . Effusion of left knee 03/17/2015  . Severe back pain   . DM (diabetes mellitus), type 2 with renal complications ([place]) [id number]  . Failure to thrive in adult 05/03/2015  . Chronic combined systolic and diastolic CHF (congestive heart failure) ([place]) 10/10/2014  . CKD stage 3 due to  type 1 diabetes mellitus ([place]) 03/17/2015  . Dyspnea 11/09/2014  . Hearing loss 05/13/2009    Qualifier: Diagnosis of  By: Jenny Reichmann MD, Hunt Oris   . Hyperlipemia 11/14/2007    Qualifier: Diagnosis of  By: Jenny Reichmann MD, Hunt Oris    . Generalized weakness 05/20/2015  . Protein-calorie malnutrition, severe ([place]) 05/23/2015  . Vertebral compression fracture ([place]) 09/05/2009    Qualifier: Diagnosis of  By: Jenny Reichmann MD, Hunt Oris   . [name] 04/04/2015  . Weight loss 10/10/2014    Past Surgical History  Procedure Laterality Date  . S/p coronary stent x 1    . Cholecystectomy    . S/p bilat knee replacement  Cleon Gustin, MD  . Pacemaker insertion  2012    Caryl Comes, MD  . Prostate needle biopsy    . Cystoscopy    . Appendectomy    . Coronary artery bypass graft  2007    Social History   Social History  . Marital Status: Widowed    Spouse Name: N/A  . Number of Children: 2  . Years of Education: College   Occupational History  . retired operated [employer]  . Smoking status: Former Smoker -- 1.00 packs/day for 50 years    Types: Cigarettes    Quit date: 10/18/1983  . Smokeless tobacco: Never Used  . Alcohol Use: No  . Drug Use: No  . Sexual Activity: No   Other Topics Concern  . Not on file   Social History Narrative   Patient is widowed with 2 children.   Patient has a college education.   Patient is right handed.   Patient drinks 2 cups daily.   Walking with wallker    ROS: no fevers or chills, productive cough, hemoptysis, dysphasia, odynophagia, melena, hematochezia, dysuria, hematuria, rash, seizure activity, orthopnea, PND, claudication. Remaining systems are negative.  Physical Exam: Well-developed frail in no acute distress.  Skin is warm and dry.  HEENT is normal.  Neck is supple.  Chest with diminished BS bases Cardiovascular exam is regular rate and rhythm. 2/6 systolic murmur left sternal border and 3/6 systolic murmur apex. Abdominal  exam nontender or distended. No masses palpated. Extremities show 1+ edema. neuro grossly intact  ECG 10/31/2015-sinus rhythm with ventricular pacing.

## 2015-12-02 ENCOUNTER — Encounter: Payer: Self-pay | Admitting: Nurse Practitioner

## 2015-12-02 ENCOUNTER — Non-Acute Institutional Stay (SKILLED_NURSING_FACILITY): Payer: Medicare Other | Admitting: Nurse Practitioner

## 2015-12-02 DIAGNOSIS — E1142 Type 2 diabetes mellitus with diabetic polyneuropathy: Secondary | ICD-10-CM

## 2015-12-02 DIAGNOSIS — M5440 Lumbago with sciatica, unspecified side: Secondary | ICD-10-CM

## 2015-12-02 DIAGNOSIS — R413 Other amnesia: Secondary | ICD-10-CM | POA: Diagnosis not present

## 2015-12-02 DIAGNOSIS — I5042 Chronic combined systolic (congestive) and diastolic (congestive) heart failure: Secondary | ICD-10-CM | POA: Diagnosis not present

## 2015-12-02 DIAGNOSIS — I10 Essential (primary) hypertension: Secondary | ICD-10-CM

## 2015-12-02 DIAGNOSIS — K5900 Constipation, unspecified: Secondary | ICD-10-CM | POA: Diagnosis not present

## 2015-12-02 NOTE — Assessment & Plan Note (Signed)
Controlled, off meds.  

## 2015-12-02 NOTE — Assessment & Plan Note (Signed)
O2 dependent, DOE apparent, activity as tolerated, Hospice service and comfort care measures.     

## 2015-12-02 NOTE — Assessment & Plan Note (Signed)
06/06/15 Hgb A1c 6.7 diet control.   

## 2015-12-02 NOTE — Assessment & Plan Note (Signed)
Stable, continue 06/10/15 Lactulose prn. 

## 2015-12-02 NOTE — Progress Notes (Signed)
Patient ID: Zachary Harris, male   DOB: [birth year], 81 y.o.   MRN: [id number]  Location:  SNF [facility] Provider:  Marlana Latus NP  Code Status:  DNR Goals of care: Advanced Directive information    Chief Complaint  Patient presents with  . Medical Management of Chronic Issues     HPI: Patient is a 81 y.o. male seen in the SNF at [facility] today for evaluation of Afib, heart rate is in control, dementia, depression, HTN  Review of Systems  Constitutional: Negative for fever and chills.  HENT: Positive for hearing loss. Negative for ear pain.   Eyes: Negative for photophobia and pain.       Corrective lenses  Respiratory: Positive for shortness of breath. Negative for cough and wheezing.   Cardiovascular: Positive for leg swelling.       Pacemaker. History of syncope related to cardiac arrhythmia. Right lower leg edema 1+  Gastrointestinal: Negative for abdominal pain.  Genitourinary: Positive for urgency and frequency. Negative for dysuria and flank pain.       Underwent Urology evaluation in the past, off meds. Episodes of incontinence persist.  Musculoskeletal: Positive for myalgias, back pain and neck pain (Fall on 03/15/15 causing pain in the left knee and effusion.).       Poor balance and gait disturbance. Lower back pain Sever kyphosis  Skin:       Rough area on left cheek R+L buttocks, groins, sacrum excoriated areas are much improved Limited improvement 1/3 lower RLE redness, warmth, swelling, and pain with weight bearing   Neurological: Negative for tremors, seizures and weakness.       Memory loss  Endo/Heme/Allergies: Negative for polydipsia.  Psychiatric/Behavioral:       Mild to moderate memory disturbance.    Past Medical History  Diagnosis Date  . ANEMIA-IRON DEFICIENCY 05/27/2007  . ANXIETY 11/14/2007  . Intermittent high-grade heart block 09/05/2009    s/p pacemaker  . BENIGN PROSTATIC HYPERTROPHY 11/14/2007  . CAROTID ARTERY DISEASE 10/02/2010  .  Pacemaker-St. Jude 02/02/2008    St. Jude  . COLONIC POLYPS, HX OF 05/27/2007  . CORONARY ARTERY DISEASE 11/14/2007    prior bypass [phone]; Inferior MI 11/07; Severe 3 vessel disease; occluded SVG to RCA; SVG to OM subtotalled, SVG to diagonal with ostial 70 and 80 mid; EF 45. PCI of SVG to OM. FU myovue showed EF 56 with inferior infarct and very mild peri-infarct ischemia  . DEPRESSION 11/14/2007  . DIABETES MELLITUS, TYPE II 11/14/2007  . DIVERTICULITIS, HX OF 05/27/2007  . DVT (deep venous thrombosis) ([place]) 03/21/2011  . FOOT PAIN, BILATERAL 12/08/2010  . GERD 11/14/2007  . HYPERLIPIDEMIA 11/14/2007  . HYPERTENSION 11/14/2007  . HYPOTENSION, ORTHOSTATIC 03/06/2011  . Memory loss 06/01/2008  . OSTEOPOROSIS 06/02/2010  . PERIPHERAL VASCULAR DISEASE 11/14/2007  . RENAL INSUFFICIENCY 11/14/2007  . SECONDARY DM W/PERIPHERAL CIRC D/O UNCONTROLLED 11/07/2010  . SYNCOPE 09/08/2008  . Unspecified hearing loss 05/13/2009  . Prostate cancer ([place])   . Myalgia and myositis, unspecified 04/24/2011  . Other malaise and fatigue 04/24/2011  . Peripheral neuropathy ([place]) 03/27/2011  . Seborrheic keratosis 06/05/2011  . Osteoarthrosis, unspecified whether generalized or localized, unspecified site   . Ischemic cardiomyopathy     a. echo (10/15):  mod focal basal septal hypertrophy, EF 40-45%, inf AK, Gr 1 DD, Al sclerosis, mild AI, MAC, mild MR, mod LAE, lipomatous hypertrophy, small effusion (no hemodynamic compromise)   . Effusion of left knee 03/17/2015  . Severe  back pain   . DM (diabetes mellitus), type 2 with renal complications ([place]) [id number]  . Failure to thrive in adult 05/03/2015  . Chronic combined systolic and diastolic CHF (congestive heart failure) ([place]) 10/10/2014  . CKD stage 3 due to type 1 diabetes mellitus ([place]) 03/17/2015  . Dyspnea 11/09/2014  . Hearing loss 05/13/2009    Qualifier: Diagnosis of  By: Jenny Reichmann MD, Hunt Oris   . Hyperlipemia 11/14/2007    Qualifier: Diagnosis of  By: Jenny Reichmann MD,  Hunt Oris    . Generalized weakness 05/20/2015  . Protein-calorie malnutrition, severe ([place]) 05/23/2015  . Vertebral compression fracture ([place]) 09/05/2009    Qualifier: Diagnosis of  By: Jenny Reichmann MD, Hunt Oris   . [name] 04/04/2015  . Weight loss 10/10/2014    Patient Active Problem List   Diagnosis Date Noted  . Contusion of right lower leg 09/16/2015  . Cellulitis of leg, right 09/13/2015  . Fever presenting with conditions classified elsewhere 08/16/2015  . Candidiasis of skin 06/07/2015  . Constipation 05/27/2015  . Protein-calorie malnutrition, severe ([place]) 05/23/2015  . Palliative care encounter   . Compression fracture 05/20/2015  . Generalized weakness 05/20/2015  . Dehydration 05/20/2015  . DM (diabetes mellitus), type 2 with renal complications ([place]) [id number]  . Failure to thrive in adult 05/03/2015  . Weak 04/04/2015  . CKD stage 3 due to type 1 diabetes mellitus ([place]) 03/17/2015  . Chronic respiratory failure ([place]) 01/13/2015  . Dyspnea 11/09/2014  . Paroxysmal atrial fibrillation ([place]) 10/25/2014  . Ischemic cardiomyopathy 10/10/2014  . Chronic combined systolic and diastolic CHF (congestive heart failure) ([place]) 10/10/2014  . Weight loss 10/10/2014  . Other malaise and fatigue 08/16/2013  . Infection of urinary tract 08/16/2013  . Urine retention 08/16/2013  . Urge incontinence 08/16/2013  . Nocturia 08/16/2013  . Nodular prostate with urinary obstruction 08/16/2013  . DM type 2 with diabetic peripheral neuropathy ([place]) 04/28/2013  . Malignant neoplasm of prostate ([place]) 02/25/2013  . HYPOTENSION, ORTHOSTATIC 03/06/2011  . Osteoporosis 06/02/2010  . Backache 09/05/2009  . Vertebral compression fracture ([place]) 09/05/2009  . Intermittent high-grade heart block 09/05/2009  . Hearing loss 05/13/2009  . Memory loss 06/01/2008  . Pacemaker-St.Jude 02/02/2008  . Hyperlipemia 11/14/2007  . ANXIETY 11/14/2007  . Depression 11/14/2007  . Essential hypertension 11/14/2007  .  MYOCARDIAL INFARCTION, HX OF 11/14/2007  . CAD (coronary artery disease) 11/14/2007  . Peripheral vascular disease ([place]) 11/14/2007  . GERD 11/14/2007  . Iron deficiency anemia 05/27/2007  . COLONIC POLYPS, HX OF 05/27/2007    Allergies  Allergen Reactions  . Hydrocodone     hallucination  . Tramadol     hallucination    Medications: Patient's Medications  New Prescriptions   No medications on file  Previous Medications   ACETAMINOPHEN (TYLENOL) 500 MG TABLET    Take 2 tablets (1,000 mg total) by mouth 2 (two) times daily.   FEEDING SUPPLEMENT, GLUCERNA SHAKE, (GLUCERNA SHAKE) LIQD    Take 237 mLs by mouth 2 (two) times daily between meals.   FENTANYL (DURAGESIC) 25 MCG/HR PATCH    Apply fresh patch every 3 days to help pain. Remove old patch.   LACTULOSE (CHRONULAC) 10 GM/15ML SOLUTION    Take 20 g by mouth every other day. Every other day as needed   NITROGLYCERIN (NITROSTAT) 0.4 MG SL TABLET    Place 0.4 mg under the tongue every 5 (five) minutes as needed for chest pain.  Modified Medications   No medications on file  Discontinued Medications   No medications on file    Physical Exam: Filed Vitals:   12/02/15 1431  BP: 127/70  Pulse: 78  Temp: 97.2 F (36.2 C)  TempSrc: Tympanic  Resp: 20   There is no weight on file to calculate BMI.  Physical Exam  Constitutional: He is oriented to person, place, and time.  Frail  HENT:  Head: Normocephalic and atraumatic.  Nose: Nose normal.  Bilateral hearing aids.  Eyes: Conjunctivae are normal. Pupils are equal, round, and reactive to light.  Prescription lenses.  Neck: Normal range of motion. Neck supple. No JVD present. No thyromegaly present.  Cardiovascular: Normal rate and regular rhythm.  Exam reveals no gallop and no friction rub.   Murmur heard.  Systolic murmur is present with a grade of 1/6  Absent DP and PT  Pulmonary/Chest: Effort normal and breath sounds normal. He has no wheezes. He has no rales. He  exhibits no tenderness.  Using portable oxygen at 2 L/m today.  Abdominal: Soft. Bowel sounds are normal. He exhibits no distension. There is no tenderness.  Musculoskeletal: Normal range of motion. He exhibits edema. He exhibits no tenderness.  Pain in the left SI joint area. Limps when walking. Favors the left side. Effusion of the left knee. Scars in the left knee from previous surgery. Tender at the left knee with movement of the patella. Positional lower back pain Server kyphosis RLE edema 1+  Neurological: He is alert and oriented to person, place, and time. He has normal strength. A sensory deficit is present. No cranial nerve deficit. Gait abnormal.  07/13/14 MMSE 28/30. Passed clock drawing. Diminshed sensation to vabration and monofilament.  Skin: Skin is warm and dry. No erythema. No pallor.  Rough area on the left cheek. Cystic lesion in the mid forehead. Mid sternal surgical scar. Left upper chest pacemaker. R+L buttocks, groins, sacrum excoriated areas are improved Limited improvement 1/3 lower RLE redness, warmth, swelling, and pain with weight bearing.     Psychiatric: He has a normal mood and affect. His behavior is normal. Thought content normal.  Patient is in denial regarding the extent of his memory deficit.    Labs reviewed: Basic Metabolic Panel:  Recent Labs  05/20/15 1341 05/21/15 0523 05/22/15 0533 06/06/15 08/17/15 08/30/15  NA 137 136 135 136* 136* 137  K 3.8 3.4* 4.4 4.4 4.0 4.4  CL 104 104 107  --   --   --   CO2 23 25 21*  --   --   --   GLUCOSE 149* 137* 126*  --   --   --   BUN 64* 57* 33* 32* 37* 25*  CREATININE 1.67* 1.47* 0.92 1.0 0.7 0.7  CALCIUM 9.5 8.8* 8.6*  --   --   --     Liver Function Tests:  Recent Labs  05/03/15 1040  05/20/15 1341 06/06/15 08/17/15 08/30/15  AST 21  < > 35 27 25 17   ALT 11  < > 19 20 16  8*  ALKPHOS 66  < > 122 162* 156* 190*  BILITOT 0.8  --  0.9  --   --   --   PROT 7.1  --  6.8  --   --   --   ALBUMIN  3.9  --  3.1*  --   --   --   < > = values in this interval not displayed.  CBC:  Recent Labs  05/03/15 1040 05/20/15 1341 05/21/15 0523 05/22/15  0533 06/06/15 08/17/15 08/30/15  WBC 9.6 8.3 6.3 5.4 8.7 10.5 8.2  NEUTROABS 6.8 5.9  --   --   --   --   --   HGB  --  13.1 11.5* 11.9* 12.0* 11.3* 11.1*  HCT 42.1 39.0 34.6* 35.3* 37* 34* 34*  MCV  --  90.9 90.1 90.1  --   --   --   PLT  --  182 147* 126* 264 144* 239    Lab Results  Component Value Date   TSH 3.16 06/06/2015   Lab Results  Component Value Date   HGBA1C 6.7* 06/06/2015   Lab Results  Component Value Date   CHOL 132 02/07/2015   HDL 33* 02/07/2015   LDLCALC 76 02/07/2015   LDLDIRECT 85.6 07/19/2010   TRIG 115 02/07/2015   CHOLHDL 4 03/06/2011    Significant Diagnostic Results since last visit: none  Patient Care Team: Estill Dooms, MD as PCP - General (Internal Medicine) Irine Seal, MD as Consulting Physician (Urology) Renella Cunas, MD as Consulting Physician (Cardiology) [facility] Lelon Perla, MD as Consulting Physician (Cardiology) Tirso Laws X, NP as Nurse Practitioner (Nurse Practitioner) Rigoberto Noel, MD as Consulting Physician (Pulmonary Disease)  Assessment/Plan Problem List Items Addressed This Visit    Backache - Primary (Chronic)    Etiology of the back pain. Continue Fentanyl and Tylenol for pain control. Activity as tolerated.       Chronic combined systolic and diastolic CHF (congestive heart failure) (HCC) (Chronic)    O2 dependent, DOE apparent, activity as tolerated, Hospice service and comfort care measures.       Constipation    Stable, continue 06/10/15 Lactulose prn.      DM type 2 with diabetic peripheral neuropathy (HCC) (Chronic)    06/06/15 Hgb A1c 6.7 diet control.      Essential hypertension (Chronic)    Controlled, off meds.       Memory loss (Chronic)    Progressing. off Namenda R>B. SNF for care needs. Comfort measures.           Family/  staff Communication: observe the RLE. Continue Hospice Service.   Labs/tests ordered: none  ManXie Toretto Tingler NP [facility] [address], [place] [address] On Call:  [phone] & follow prompts after 5pm & weekends Office Phone:  [phone] Office Fax:  [phone]

## 2015-12-02 NOTE — Assessment & Plan Note (Signed)
Etiology of the back pain. Continue Fentanyl and Tylenol for pain control. Activity as tolerated.    

## 2015-12-02 NOTE — Assessment & Plan Note (Signed)
Progressing. off Namenda R>B. SNF for care needs. Comfort measures.   

## 2015-12-05 ENCOUNTER — Encounter: Payer: Self-pay | Admitting: Cardiology

## 2015-12-05 ENCOUNTER — Ambulatory Visit (INDEPENDENT_AMBULATORY_CARE_PROVIDER_SITE_OTHER): Payer: Medicare Other | Admitting: Cardiology

## 2015-12-05 VITALS — BP 140/60 | HR 80 | Ht 69.5 in | Wt 175.2 lb

## 2015-12-05 DIAGNOSIS — I10 Essential (primary) hypertension: Secondary | ICD-10-CM

## 2015-12-05 DIAGNOSIS — I5042 Chronic combined systolic (congestive) and diastolic (congestive) heart failure: Secondary | ICD-10-CM | POA: Diagnosis not present

## 2015-12-05 DIAGNOSIS — Z79899 Other long term (current) drug therapy: Secondary | ICD-10-CM

## 2015-12-05 DIAGNOSIS — I480 Paroxysmal atrial fibrillation: Secondary | ICD-10-CM

## 2015-12-05 DIAGNOSIS — I2583 Coronary atherosclerosis due to lipid rich plaque: Secondary | ICD-10-CM

## 2015-12-05 DIAGNOSIS — I2510 Atherosclerotic heart disease of native coronary artery without angina pectoris: Secondary | ICD-10-CM | POA: Diagnosis not present

## 2015-12-05 DIAGNOSIS — Z950 Presence of cardiac pacemaker: Secondary | ICD-10-CM

## 2015-12-05 LAB — BASIC METABOLIC PANEL
BUN: 38 mg/dL — ABNORMAL HIGH (ref 7–25)
CALCIUM: 9.2 mg/dL (ref 8.6–10.3)
CO2: 24 mmol/L (ref 20–31)
CREATININE: 0.82 mg/dL (ref 0.70–1.11)
Chloride: 104 mmol/L (ref 98–110)
Glucose, Bld: 210 mg/dL — ABNORMAL HIGH (ref 65–99)
Potassium: 4.3 mmol/L (ref 3.5–5.3)
SODIUM: 139 mmol/L (ref 135–146)

## 2015-12-05 MED ORDER — FUROSEMIDE 20 MG PO TABS: 20.0000 mg | ORAL_TABLET | Freq: Every day | ORAL | Status: DC

## 2015-12-05 NOTE — Assessment & Plan Note (Signed)
Blood pressure controlled. 

## 2015-12-05 NOTE — Assessment & Plan Note (Signed)
Followed by electrophysiology. 

## 2015-12-05 NOTE — Assessment & Plan Note (Signed)
Patient is volume overloaded on examination. Add Lasix 20 mg daily.Check potassium and renal function in 1 week. We will focus on keeping him comfortable without excess edema or shortness of breath.

## 2015-12-05 NOTE — Assessment & Plan Note (Addendum)
Difficult situation.Patient has multiple embolic risk factors including age greater than 41, coronary artery disease, Diabetes mellitus, hypertension and congestive heart failure. CHADSvasc 6. He would benefit long-term from anticoagulation to reduce thromboembolic event. However he is frail and has fallen in the past. He apparently was placed on hospice last spring but has made significant improvement. Long discussion today concerning the risks and benefits of anticoagulation. I explained the risk of bleeding versus CVA. We discussed this issue for approximately 30 minutes. His son and daughter were present and requested that he be placed on anticoagulation and patient in agreement. They are most concerned about risk of CVA. I will check his renal function today. Based on the results we will begin apixaban. Repeat hemoglobin and renal function in 1 week.

## 2015-12-05 NOTE — Patient Instructions (Addendum)
Medication Instructions:  START Furosemide (Lasix) 20 mg - take 1 tablet by mouth daily  >>A new prescription has been sent to the pharmacy electronically.  Labwork: Your physician recommends that you return for lab work TODAY and again in [place].  Testing/Procedures: NONE  Follow-Up: Dr Stanford Breed recommends that you schedule a follow-up appointment in 3 months.  If you need a refill on your cardiac medications before your next appointment, please call your pharmacy.   **We will call you about Eliquis based on the results of your blood today - kidney function.

## 2015-12-05 NOTE — Assessment & Plan Note (Signed)
I discussed aggressiveness of care.They indicated that he would not want any further procedures including echocardiograms, cardiac catheterizations. We will not add a statin. No aspirin given need for anticoagulation.

## 2015-12-06 ENCOUNTER — Telehealth: Payer: Self-pay | Admitting: *Deleted

## 2015-12-06 MED ORDER — APIXABAN 5 MG PO TABS: 5.0000 mg | ORAL_TABLET | Freq: Two times a day (BID) | ORAL | Status: DC

## 2015-12-06 NOTE — Telephone Encounter (Signed)
-----   Message from Lelon Perla, MD sent at 12/06/2015  5:35 AM EST ----- apixaban 5 mg bid, dc lasix Kirk Ruths

## 2015-12-06 NOTE — Telephone Encounter (Signed)
Spoke with pt dtr and son, aware of lab results. Spoke with Ysuing Ie rn at [facility] [place], medication orders given.

## 2015-12-09 ENCOUNTER — Encounter: Payer: Self-pay | Admitting: Internal Medicine

## 2015-12-12 DIAGNOSIS — D649 Anemia, unspecified: Secondary | ICD-10-CM | POA: Diagnosis not present

## 2015-12-12 DIAGNOSIS — M182 Bilateral post-traumatic osteoarthritis of first carpometacarpal joints: Secondary | ICD-10-CM | POA: Diagnosis not present

## 2015-12-12 LAB — CBC AND DIFFERENTIAL
HEMATOCRIT: 35 % — AB (ref 41–53)
Hemoglobin: 11.4 g/dL — AB (ref 13.5–17.5)
PLATELETS: 133 10*3/uL — AB (ref 150–399)
WBC: 5.3 10*3/mL

## 2015-12-12 LAB — BASIC METABOLIC PANEL
BUN: 42 mg/dL — AB (ref 4–21)
CREATININE: 1 mg/dL (ref 0.6–1.3)
Glucose: 135 mg/dL
Potassium: 4.5 mmol/L (ref 3.4–5.3)
Sodium: 139 mmol/L (ref 137–147)

## 2015-12-13 ENCOUNTER — Other Ambulatory Visit: Payer: Self-pay | Admitting: Nurse Practitioner

## 2015-12-13 DIAGNOSIS — D509 Iron deficiency anemia, unspecified: Secondary | ICD-10-CM

## 2015-12-13 DIAGNOSIS — N183 Chronic kidney disease, stage 3 unspecified: Secondary | ICD-10-CM

## 2015-12-13 DIAGNOSIS — E1022 Type 1 diabetes mellitus with diabetic chronic kidney disease: Secondary | ICD-10-CM

## 2015-12-30 ENCOUNTER — Non-Acute Institutional Stay (SKILLED_NURSING_FACILITY): Payer: Medicare Other | Admitting: Nurse Practitioner

## 2015-12-30 ENCOUNTER — Encounter: Payer: Self-pay | Admitting: Nurse Practitioner

## 2015-12-30 DIAGNOSIS — R635 Abnormal weight gain: Secondary | ICD-10-CM

## 2015-12-30 DIAGNOSIS — D509 Iron deficiency anemia, unspecified: Secondary | ICD-10-CM | POA: Diagnosis not present

## 2015-12-30 DIAGNOSIS — R413 Other amnesia: Secondary | ICD-10-CM | POA: Diagnosis not present

## 2015-12-30 DIAGNOSIS — M5440 Lumbago with sciatica, unspecified side: Secondary | ICD-10-CM

## 2015-12-30 DIAGNOSIS — E1142 Type 2 diabetes mellitus with diabetic polyneuropathy: Secondary | ICD-10-CM | POA: Diagnosis not present

## 2015-12-30 DIAGNOSIS — K5900 Constipation, unspecified: Secondary | ICD-10-CM

## 2015-12-30 DIAGNOSIS — I5042 Chronic combined systolic (congestive) and diastolic (congestive) heart failure: Secondary | ICD-10-CM

## 2015-12-30 DIAGNOSIS — I480 Paroxysmal atrial fibrillation: Secondary | ICD-10-CM

## 2015-12-30 DIAGNOSIS — I10 Essential (primary) hypertension: Secondary | ICD-10-CM | POA: Diagnosis not present

## 2015-12-30 NOTE — Assessment & Plan Note (Signed)
Rate is in control w/o medications.    

## 2015-12-30 NOTE — Assessment & Plan Note (Signed)
Etiology of the back pain. Continue Fentanyl and Tylenol for pain control. Activity as tolerated.    

## 2015-12-30 NOTE — Assessment & Plan Note (Signed)
Stable, continue 06/10/15 Lactulose prn. 

## 2015-12-30 NOTE — Assessment & Plan Note (Signed)
Progressing. off Namenda R>B. SNF for care needs. Comfort measures.   

## 2015-12-30 NOTE — Assessment & Plan Note (Signed)
06/06/15 Hgb A1c 6.7 diet control.   

## 2015-12-30 NOTE — Assessment & Plan Note (Signed)
12/12/15 Hgb 11.4

## 2015-12-30 NOTE — Progress Notes (Signed)
Patient ID: Zachary Harris, male   DOB: [birth year], 82 y.o.   MRN: [id number]  Location:  SNF [facility] Provider:  Marlana Latus NP  Code Status:  DNR Goals of care: Advanced Directive information    Chief Complaint  Patient presents with  . Medical Management of Chronic Issues  . Acute Visit    weight gain #10Ibs/a month     HPI: Patient is a 82 y.o. male seen in the SNF at [facility] today for evaluation of weight gain #10Ibs over the past month, no increased cough, SOB, or apparent swelling.  Afib, heart rate is in control, dementia, depression, HTN  Review of Systems  Constitutional: Negative for fever and chills.       Weight gain #10Ibs in the past month.   HENT: Positive for hearing loss. Negative for ear pain.   Eyes: Negative for photophobia and pain.       Corrective lenses  Respiratory: Positive for shortness of breath. Negative for cough and wheezing.   Cardiovascular: Positive for leg swelling.       Pacemaker. History of syncope related to cardiac arrhythmia. Right lower leg edema 1+  Gastrointestinal: Negative for abdominal pain.  Genitourinary: Positive for urgency and frequency. Negative for dysuria and flank pain.       Underwent Urology evaluation in the past, off meds. Episodes of incontinence persist.  Musculoskeletal: Positive for myalgias, back pain and neck pain (Fall on 03/15/15 causing pain in the left knee and effusion.).       Poor balance and gait disturbance. Lower back pain Sever kyphosis  Skin:       Rough area on left cheek R+L buttocks, groins, sacrum excoriated areas are much improved Limited improvement 1/3 lower RLE redness, warmth, swelling, and pain with weight bearing   Neurological: Negative for tremors, seizures and weakness.       Memory loss  Endo/Heme/Allergies: Negative for polydipsia.  Psychiatric/Behavioral:       Mild to moderate memory disturbance.    Past Medical History  Diagnosis Date  . ANEMIA-IRON DEFICIENCY 05/27/2007    . ANXIETY 11/14/2007  . Intermittent high-grade heart block 09/05/2009    s/p pacemaker  . BENIGN PROSTATIC HYPERTROPHY 11/14/2007  . CAROTID ARTERY DISEASE 10/02/2010  . Pacemaker-St. Jude 02/02/2008    St. Jude  . COLONIC POLYPS, HX OF 05/27/2007  . CORONARY ARTERY DISEASE 11/14/2007    prior bypass [phone]; Inferior MI 11/07; Severe 3 vessel disease; occluded SVG to RCA; SVG to OM subtotalled, SVG to diagonal with ostial 70 and 80 mid; EF 45. PCI of SVG to OM. FU myovue showed EF 56 with inferior infarct and very mild peri-infarct ischemia  . DEPRESSION 11/14/2007  . DIABETES MELLITUS, TYPE II 11/14/2007  . DIVERTICULITIS, HX OF 05/27/2007  . DVT (deep venous thrombosis) ([place]) 03/21/2011  . FOOT PAIN, BILATERAL 12/08/2010  . GERD 11/14/2007  . HYPERLIPIDEMIA 11/14/2007  . HYPERTENSION 11/14/2007  . HYPOTENSION, ORTHOSTATIC 03/06/2011  . Memory loss 06/01/2008  . OSTEOPOROSIS 06/02/2010  . PERIPHERAL VASCULAR DISEASE 11/14/2007  . RENAL INSUFFICIENCY 11/14/2007  . SECONDARY DM W/PERIPHERAL CIRC D/O UNCONTROLLED 11/07/2010  . SYNCOPE 09/08/2008  . Unspecified hearing loss 05/13/2009  . Prostate cancer ([place])   . Myalgia and myositis, unspecified 04/24/2011  . Other malaise and fatigue 04/24/2011  . Peripheral neuropathy ([place]) 03/27/2011  . Seborrheic keratosis 06/05/2011  . Osteoarthrosis, unspecified whether generalized or localized, unspecified site   . Ischemic cardiomyopathy     a. echo (  10/15):  mod focal basal septal hypertrophy, EF 40-45%, inf AK, Gr 1 DD, Al sclerosis, mild AI, MAC, mild MR, mod LAE, lipomatous hypertrophy, small effusion (no hemodynamic compromise)   . Effusion of left knee 03/17/2015  . Severe back pain   . DM (diabetes mellitus), type 2 with renal complications ([place]) [id number]  . Failure to thrive in adult 05/03/2015  . Chronic combined systolic and diastolic CHF (congestive heart failure) ([place]) 10/10/2014  . CKD stage 3 due to type 1 diabetes mellitus ([place])  03/17/2015  . Dyspnea 11/09/2014  . Hearing loss 05/13/2009    Qualifier: Diagnosis of  By: Jenny Reichmann MD, Hunt Oris   . Hyperlipemia 11/14/2007    Qualifier: Diagnosis of  By: Jenny Reichmann MD, Hunt Oris    . Generalized weakness 05/20/2015  . Protein-calorie malnutrition, severe ([place]) 05/23/2015  . Vertebral compression fracture ([place]) 09/05/2009    Qualifier: Diagnosis of  By: Jenny Reichmann MD, Hunt Oris   . [name] 04/04/2015  . Weight loss 10/10/2014    Patient Active Problem List   Diagnosis Date Noted  . Contusion of right lower leg 09/16/2015  . Cellulitis of leg, right 09/13/2015  . Fever presenting with conditions classified elsewhere 08/16/2015  . Candidiasis of skin 06/07/2015  . Constipation 05/27/2015  . Protein-calorie malnutrition, severe ([place]) 05/23/2015  . Palliative care encounter   . Compression fracture 05/20/2015  . Generalized weakness 05/20/2015  . Dehydration 05/20/2015  . DM (diabetes mellitus), type 2 with renal complications ([place]) [id number]  . Failure to thrive in adult 05/03/2015  . Weak 04/04/2015  . CKD stage 3 due to type 1 diabetes mellitus ([place]) 03/17/2015  . Chronic respiratory failure ([place]) 01/13/2015  . Dyspnea 11/09/2014  . Paroxysmal atrial fibrillation ([place]) 10/25/2014  . Ischemic cardiomyopathy 10/10/2014  . Chronic combined systolic and diastolic CHF (congestive heart failure) ([place]) 10/10/2014  . Weight gain 10/10/2014  . Other malaise and fatigue 08/16/2013  . Infection of urinary tract 08/16/2013  . Urine retention 08/16/2013  . Urge incontinence 08/16/2013  . Nocturia 08/16/2013  . Nodular prostate with urinary obstruction 08/16/2013  . DM type 2 with diabetic peripheral neuropathy ([place]) 04/28/2013  . Malignant neoplasm of prostate ([place]) 02/25/2013  . HYPOTENSION, ORTHOSTATIC 03/06/2011  . Osteoporosis 06/02/2010  . Backache 09/05/2009  . Vertebral compression fracture ([place]) 09/05/2009  . Intermittent high-grade heart block 09/05/2009  . Hearing loss 05/13/2009   . Memory loss 06/01/2008  . Pacemaker-St.Jude 02/02/2008  . Hyperlipemia 11/14/2007  . ANXIETY 11/14/2007  . Depression 11/14/2007  . Essential hypertension 11/14/2007  . MYOCARDIAL INFARCTION, HX OF 11/14/2007  . CAD (coronary artery disease) 11/14/2007  . Peripheral vascular disease ([place]) 11/14/2007  . GERD 11/14/2007  . Iron deficiency anemia 05/27/2007  . COLONIC POLYPS, HX OF 05/27/2007    Allergies  Allergen Reactions  . Hydrocodone     hallucination  . Tramadol     hallucination    Medications: Patient's Medications  New Prescriptions   No medications on file  Previous Medications   ACETAMINOPHEN (TYLENOL) 500 MG TABLET    Take 2 tablets (1,000 mg total) by mouth 2 (two) times daily.   APIXABAN (ELIQUIS) 5 MG TABS TABLET    Take 1 tablet (5 mg total) by mouth 2 (two) times daily.   FEEDING SUPPLEMENT, GLUCERNA SHAKE, (GLUCERNA SHAKE) LIQD    Take 237 mLs by mouth 2 (two) times daily between meals.   FENTANYL (DURAGESIC) 25 MCG/HR PATCH    Apply fresh patch every 3 days  to help pain. Remove old patch.   LACTULOSE (CHRONULAC) 10 GM/15ML SOLUTION    Take 20 g by mouth every other day. Every other day as needed   NITROGLYCERIN (NITROSTAT) 0.4 MG SL TABLET    Place 0.4 mg under the tongue every 5 (five) minutes as needed for chest pain.  Modified Medications   No medications on file  Discontinued Medications   No medications on file    Physical Exam: Filed Vitals:   12/30/15 1619  BP: 121/66  Pulse: 77  Temp: 97.7 F (36.5 C)  TempSrc: Tympanic  Resp: 20   There is no weight on file to calculate BMI.  Physical Exam  Constitutional: He is oriented to person, place, and time.  Frail  HENT:  Head: Normocephalic and atraumatic.  Nose: Nose normal.  Bilateral hearing aids.  Eyes: Conjunctivae are normal. Pupils are equal, round, and reactive to light.  Prescription lenses.  Neck: Normal range of motion. Neck supple. No JVD present. No thyromegaly present.   Cardiovascular: Normal rate and regular rhythm.  Exam reveals no gallop and no friction rub.   Murmur heard.  Systolic murmur is present with a grade of 1/6  Absent DP and PT  Pulmonary/Chest: Effort normal and breath sounds normal. He has no wheezes. He has no rales. He exhibits no tenderness.  Using portable oxygen at 2 L/m today.  Abdominal: Soft. Bowel sounds are normal. He exhibits no distension. There is no tenderness.  Musculoskeletal: Normal range of motion. He exhibits edema. He exhibits no tenderness.  Pain in the left SI joint area. Limps when walking. Favors the left side. Effusion of the left knee. Scars in the left knee from previous surgery. Tender at the left knee with movement of the patella. Positional lower back pain Server kyphosis RLE edema 1+  Neurological: He is alert and oriented to person, place, and time. He has normal strength. A sensory deficit is present. No cranial nerve deficit. Gait abnormal.  07/13/14 MMSE 28/30. Passed clock drawing. Diminshed sensation to vabration and monofilament.  Skin: Skin is warm and dry. No erythema. No pallor.  Rough area on the left cheek. Cystic lesion in the mid forehead. Mid sternal surgical scar. Left upper chest pacemaker. R+L buttocks, groins, sacrum excoriated areas are improved Limited improvement 1/3 lower RLE redness, warmth, swelling, and pain with weight bearing.     Psychiatric: He has a normal mood and affect. His behavior is normal. Thought content normal.  Patient is in denial regarding the extent of his memory deficit.    Labs reviewed: Basic Metabolic Panel:  Recent Labs  05/21/15 0523 05/22/15 0533  08/30/15 12/05/15 1055 12/12/15  NA 136 135  < > 137 139 139  K 3.4* 4.4  < > 4.4 4.3 4.5  CL 104 107  --   --  104  --   CO2 25 21*  --   --  24  --   GLUCOSE 137* 126*  --   --  210*  --   BUN 57* 33*  < > 25* 38* 42*  CREATININE 1.47* 0.92  < > 0.7 0.82 1.0  CALCIUM 8.8* 8.6*  --   --  9.2  --   <  > = values in this interval not displayed.  Liver Function Tests:  Recent Labs  05/03/15 1040  05/20/15 1341 06/06/15 08/17/15 08/30/15  AST 21  < > 35 27 25 17   ALT 11  < > 19 20 16  8*  ALKPHOS 66  < > 122 162* 156* 190*  BILITOT 0.8  --  0.9  --   --   --   PROT 7.1  --  6.8  --   --   --   ALBUMIN 3.9  --  3.1*  --   --   --   < > = values in this interval not displayed.  CBC:  Recent Labs  05/03/15 1040 05/20/15 1341 05/21/15 0523 05/22/15 0533  08/17/15 08/30/15 12/12/15  WBC 9.6 8.3 6.3 5.4  < > 10.5 8.2 5.3  NEUTROABS 6.8 5.9  --   --   --   --   --   --   HGB  --  13.1 11.5* 11.9*  < > 11.3* 11.1* 11.4*  HCT 42.1 39.0 34.6* 35.3*  < > 34* 34* 35*  MCV 93 90.9 90.1 90.1  --   --   --   --   PLT  --  182 147* 126*  < > 144* 239 133*  < > = values in this interval not displayed.  Lab Results  Component Value Date   TSH 3.16 06/06/2015   Lab Results  Component Value Date   HGBA1C 6.7* 06/06/2015   Lab Results  Component Value Date   CHOL 132 02/07/2015   HDL 33* 02/07/2015   LDLCALC 76 02/07/2015   LDLDIRECT 85.6 07/19/2010   TRIG 115 02/07/2015   CHOLHDL 4 03/06/2011    Significant Diagnostic Results since last visit: none  Patient Care Team: Estill Dooms, MD as PCP - General (Internal Medicine) Irine Seal, MD as Consulting Physician (Urology) Renella Cunas, MD as Consulting Physician (Cardiology) [facility] Lelon Perla, MD as Consulting Physician (Cardiology) Alysabeth Scalia X, NP as Nurse Practitioner (Nurse Practitioner) Rigoberto Noel, MD as Consulting Physician (Pulmonary Disease)  Assessment/Plan Problem List Items Addressed This Visit    Weight gain - Primary    About #10Ibs in the past month, no apparent increased edema, no noted cough or orthopnea or O2 desat, will update CBC, CMP, BNP, TSH      Paroxysmal atrial fibrillation ([place])    Rate is in control w/o medications.      Memory loss (Chronic)    Progressing. off Namenda  R>B. SNF for care needs. Comfort measures.       Iron deficiency anemia    12/12/15 Hgb 11.4      Essential hypertension (Chronic)    Controlled, off meds.       DM type 2 with diabetic peripheral neuropathy ([facility]) (Chronic)    06/06/15 Hgb A1c 6.7 diet control      Constipation    Stable, continue 06/10/15 Lactulose prn.      Chronic combined systolic and diastolic CHF (congestive heart failure) ([facility]) (Chronic)    12/05/15 Furosemide 20mg  daily [facility]      Backache (Chronic)    Etiology of the back pain. Continue Fentanyl and Tylenol for pain control. Activity as tolerated.           Family/ staff Communication: observe the RLE. Continue Hospice Service.   Labs/tests ordered: CBC, CMP, TSH, BNP   ManXie Carrell Pal[place]ier NP [facility] [address], [place] [address] On Call:  [phone] & follow prompts after 5pm & weekends Office Phone:  [phone] Office Fax:  [phone]

## 2015-12-30 NOTE — Assessment & Plan Note (Signed)
12/05/15 Furosemide 20mg  daily [facility]

## 2015-12-30 NOTE — Assessment & Plan Note (Signed)
About #10Ibs in the past month, no apparent increased edema, no noted cough or orthopnea or O2 desat, will update CBC, CMP, BNP, TSH

## 2015-12-30 NOTE — Assessment & Plan Note (Signed)
Controlled, off meds.  

## 2016-01-02 DIAGNOSIS — I509 Heart failure, unspecified: Secondary | ICD-10-CM | POA: Diagnosis not present

## 2016-01-02 DIAGNOSIS — E039 Hypothyroidism, unspecified: Secondary | ICD-10-CM | POA: Diagnosis not present

## 2016-01-02 DIAGNOSIS — E876 Hypokalemia: Secondary | ICD-10-CM | POA: Diagnosis not present

## 2016-01-02 DIAGNOSIS — D649 Anemia, unspecified: Secondary | ICD-10-CM | POA: Diagnosis not present

## 2016-01-03 ENCOUNTER — Encounter: Payer: Self-pay | Admitting: Nurse Practitioner

## 2016-01-03 ENCOUNTER — Non-Acute Institutional Stay (SKILLED_NURSING_FACILITY): Payer: Medicare Other | Admitting: Nurse Practitioner

## 2016-01-03 DIAGNOSIS — K219 Gastro-esophageal reflux disease without esophagitis: Secondary | ICD-10-CM | POA: Diagnosis not present

## 2016-01-03 DIAGNOSIS — I480 Paroxysmal atrial fibrillation: Secondary | ICD-10-CM

## 2016-01-03 DIAGNOSIS — I5042 Chronic combined systolic (congestive) and diastolic (congestive) heart failure: Secondary | ICD-10-CM

## 2016-01-03 DIAGNOSIS — I10 Essential (primary) hypertension: Secondary | ICD-10-CM

## 2016-01-03 DIAGNOSIS — D509 Iron deficiency anemia, unspecified: Secondary | ICD-10-CM | POA: Diagnosis not present

## 2016-01-03 DIAGNOSIS — M5440 Lumbago with sciatica, unspecified side: Secondary | ICD-10-CM

## 2016-01-03 DIAGNOSIS — R413 Other amnesia: Secondary | ICD-10-CM

## 2016-01-03 DIAGNOSIS — R635 Abnormal weight gain: Secondary | ICD-10-CM

## 2016-01-03 NOTE — Assessment & Plan Note (Signed)
Controlled, off meds.  

## 2016-01-03 NOTE — Assessment & Plan Note (Signed)
Rate is in control w/o medications. Hold Eliquis until Hgb 7.6 01/02/16 is ruled out r/t GI bleed.

## 2016-01-03 NOTE — Assessment & Plan Note (Signed)
Progressing. off Namenda R>B. SNF for care needs. Comfort measures.   

## 2016-01-03 NOTE — Progress Notes (Signed)
Patient ID: Zachary Harris, male   DOB: Jun 20, 1923, 82 y.o.   MRN: [id number]  Location:  SNF [facility] Provider:  Marlana Latus NP  Code Status:  DNR Goals of care: Advanced Directive information    Chief Complaint  Patient presents with  . Medical Management of Chronic Issues  . Acute Visit    anemia, Hgb 7.6, dropped from 11.4 12/22/15     HPI: Patient is a 82 y.o. male seen in the SNF at [facility] today for evaluation of  Afib, heart rate is in control, dementia, depression, HTN, anemia, Hgb 7.6, dropped from 11.4 12/22/15, CHF BNP 1449.1 01/02/16  Review of Systems  Constitutional: Negative for fever and chills.       Weight gain #10Ibs in the past month.   HENT: Positive for hearing loss. Negative for ear pain.   Eyes: Negative for photophobia and pain.       Corrective lenses  Respiratory: Positive for shortness of breath. Negative for cough and wheezing.   Cardiovascular: Positive for leg swelling.       Pacemaker. History of syncope related to cardiac arrhythmia. Right lower leg edema 1+  Gastrointestinal: Negative for abdominal pain.  Genitourinary: Positive for urgency and frequency. Negative for dysuria and flank pain.       Underwent Urology evaluation in the past, off meds. Episodes of incontinence persist.  Musculoskeletal: Positive for myalgias, back pain and neck pain (Fall on 03/15/15 causing pain in the left knee and effusion.).       Poor balance and gait disturbance. Lower back pain Sever kyphosis  Skin:       Rough area on left cheek R+L buttocks, groins, sacrum excoriated areas are much improved Limited improvement 1/3 lower RLE redness, warmth, swelling, and pain with weight bearing   Neurological: Negative for tremors, seizures and weakness.       Memory loss  Endo/Heme/Allergies: Negative for polydipsia.  Psychiatric/Behavioral:       Mild to moderate memory disturbance.    Past Medical History  Diagnosis Date  . ANEMIA-IRON DEFICIENCY 05/27/2007    . ANXIETY 11/14/2007  . Intermittent high-grade heart block 09/05/2009    s/p pacemaker  . BENIGN PROSTATIC HYPERTROPHY 11/14/2007  . CAROTID ARTERY DISEASE 10/02/2010  . Pacemaker-St. Jude 02/02/2008    St. Jude  . COLONIC POLYPS, HX OF 05/27/2007  . CORONARY ARTERY DISEASE 11/14/2007    prior bypass [phone]; Inferior MI 11/07; Severe 3 vessel disease; occluded SVG to RCA; SVG to OM subtotalled, SVG to diagonal with ostial 70 and 80 mid; EF 45. PCI of SVG to OM. FU myovue showed EF 56 with inferior infarct and very mild peri-infarct ischemia  . DEPRESSION 11/14/2007  . DIABETES MELLITUS, TYPE II 11/14/2007  . DIVERTICULITIS, HX OF 05/27/2007  . DVT (deep venous thrombosis) ([place]) 03/21/2011  . FOOT PAIN, BILATERAL 12/08/2010  . GERD 11/14/2007  . HYPERLIPIDEMIA 11/14/2007  . HYPERTENSION 11/14/2007  . HYPOTENSION, ORTHOSTATIC 03/06/2011  . Memory loss 06/01/2008  . OSTEOPOROSIS 06/02/2010  . PERIPHERAL VASCULAR DISEASE 11/14/2007  . RENAL INSUFFICIENCY 11/14/2007  . SECONDARY DM W/PERIPHERAL CIRC D/O UNCONTROLLED 11/07/2010  . SYNCOPE 09/08/2008  . Unspecified hearing loss 05/13/2009  . Prostate cancer ([place])   . Myalgia and myositis, unspecified 04/24/2011  . Other malaise and fatigue 04/24/2011  . Peripheral neuropathy ([place]) 03/27/2011  . Seborrheic keratosis 06/05/2011  . Osteoarthrosis, unspecified whether generalized or localized, unspecified site   . Ischemic cardiomyopathy     a. echo (  10/15):  mod focal basal septal hypertrophy, EF 40-45%, inf AK, Gr 1 DD, Al sclerosis, mild AI, MAC, mild MR, mod LAE, lipomatous hypertrophy, small effusion (no hemodynamic compromise)   . Effusion of left knee 03/17/2015  . Severe back pain   . DM (diabetes mellitus), type 2 with renal complications ([place]) [id number]  . Failure to thrive in adult 05/03/2015  . Chronic combined systolic and diastolic CHF (congestive heart failure) ([place]) 10/10/2014  . CKD stage 3 due to type 1 diabetes mellitus ([place])  03/17/2015  . Dyspnea 11/09/2014  . Hearing loss 05/13/2009    Qualifier: Diagnosis of  By: Jenny Reichmann MD, Hunt Oris   . Hyperlipemia 11/14/2007    Qualifier: Diagnosis of  By: Jenny Reichmann MD, Hunt Oris    . Generalized weakness 05/20/2015  . Protein-calorie malnutrition, severe ([place]) 05/23/2015  . Vertebral compression fracture ([place]) 09/05/2009    Qualifier: Diagnosis of  By: Jenny Reichmann MD, Hunt Oris   . [name] 04/04/2015  . Weight loss 10/10/2014    Patient Active Problem List   Diagnosis Date Noted  . Contusion of right lower leg 09/16/2015  . Cellulitis of leg, right 09/13/2015  . Fever presenting with conditions classified elsewhere 08/16/2015  . Candidiasis of skin 06/07/2015  . Constipation 05/27/2015  . Protein-calorie malnutrition, severe ([place]) 05/23/2015  . Palliative care encounter   . Compression fracture 05/20/2015  . Generalized weakness 05/20/2015  . Dehydration 05/20/2015  . DM (diabetes mellitus), type 2 with renal complications ([place]) [id number]  . Failure to thrive in adult 05/03/2015  . Weak 04/04/2015  . CKD stage 3 due to type 1 diabetes mellitus ([place]) 03/17/2015  . Chronic respiratory failure ([place]) 01/13/2015  . Dyspnea 11/09/2014  . Paroxysmal atrial fibrillation ([place]) 10/25/2014  . Ischemic cardiomyopathy 10/10/2014  . Chronic combined systolic and diastolic CHF (congestive heart failure) ([place]) 10/10/2014  . Weight gain 10/10/2014  . Other malaise and fatigue 08/16/2013  . Infection of urinary tract 08/16/2013  . Urine retention 08/16/2013  . Urge incontinence 08/16/2013  . Nocturia 08/16/2013  . Nodular prostate with urinary obstruction 08/16/2013  . DM type 2 with diabetic peripheral neuropathy ([place]) 04/28/2013  . Malignant neoplasm of prostate ([place]) 02/25/2013  . HYPOTENSION, ORTHOSTATIC 03/06/2011  . Osteoporosis 06/02/2010  . Backache 09/05/2009  . Vertebral compression fracture ([place]) 09/05/2009  . Intermittent high-grade heart block 09/05/2009  . Hearing loss 05/13/2009   . Memory loss 06/01/2008  . Pacemaker-St.Jude 02/02/2008  . Hyperlipemia 11/14/2007  . ANXIETY 11/14/2007  . Depression 11/14/2007  . Essential hypertension 11/14/2007  . MYOCARDIAL INFARCTION, HX OF 11/14/2007  . CAD (coronary artery disease) 11/14/2007  . Peripheral vascular disease ([place]) 11/14/2007  . GERD 11/14/2007  . Iron deficiency anemia 05/27/2007  . COLONIC POLYPS, HX OF 05/27/2007    Allergies  Allergen Reactions  . Hydrocodone     hallucination  . Tramadol     hallucination    Medications: Patient's Medications  New Prescriptions   No medications on file  Previous Medications   ACETAMINOPHEN (TYLENOL) 500 MG TABLET    Take 2 tablets (1,000 mg total) by mouth 2 (two) times daily.   APIXABAN (ELIQUIS) 5 MG TABS TABLET    Take 1 tablet (5 mg total) by mouth 2 (two) times daily.   FEEDING SUPPLEMENT, GLUCERNA SHAKE, (GLUCERNA SHAKE) LIQD    Take 237 mLs by mouth 2 (two) times daily between meals.   FENTANYL (DURAGESIC) 25 MCG/HR PATCH    Apply fresh patch every 3 days  to help pain. Remove old patch.   LACTULOSE (CHRONULAC) 10 GM/15ML SOLUTION    Take 20 g by mouth every other day. Every other day as needed   NITROGLYCERIN (NITROSTAT) 0.4 MG SL TABLET    Place 0.4 mg under the tongue every 5 (five) minutes as needed for chest pain.  Modified Medications   No medications on file  Discontinued Medications   No medications on file    Physical Exam: Filed Vitals:   01/03/16 1648  BP: 118/60  Pulse: 84  Temp: 97 F (36.1 C)  TempSrc: Tympanic  Resp: 20   There is no weight on file to calculate BMI.  Physical Exam  Constitutional: He is oriented to person, place, and time.  Frail  HENT:  Head: Normocephalic and atraumatic.  Nose: Nose normal.  Bilateral hearing aids.  Eyes: Conjunctivae are normal. Pupils are equal, round, and reactive to light.  Prescription lenses.  Neck: Normal range of motion. Neck supple. No JVD present. No thyromegaly present.   Cardiovascular: Normal rate and regular rhythm.  Exam reveals no gallop and no friction rub.   Murmur heard.  Systolic murmur is present with a grade of 1/6  Absent DP and PT  Pulmonary/Chest: Effort normal and breath sounds normal. He has no wheezes. He has no rales. He exhibits no tenderness.  Using portable oxygen at 2 L/m today.  Abdominal: Soft. Bowel sounds are normal. He exhibits no distension. There is no tenderness.  Musculoskeletal: Normal range of motion. He exhibits edema. He exhibits no tenderness.  Pain in the left SI joint area. Limps when walking. Favors the left side. Effusion of the left knee. Scars in the left knee from previous surgery. Tender at the left knee with movement of the patella. Positional lower back pain Server kyphosis RLE edema 1+  Neurological: He is alert and oriented to person, place, and time. He has normal strength. A sensory deficit is present. No cranial nerve deficit. Gait abnormal.  07/13/14 MMSE 28/30. Passed clock drawing. Diminshed sensation to vabration and monofilament.  Skin: Skin is warm and dry. No erythema. No pallor.  Rough area on the left cheek. Cystic lesion in the mid forehead. Mid sternal surgical scar. Left upper chest pacemaker. R+L buttocks, groins, sacrum excoriated areas are improved Limited improvement 1/3 lower RLE redness, warmth, swelling, and pain with weight bearing.     Psychiatric: He has a normal mood and affect. His behavior is normal. Thought content normal.  Patient is in denial regarding the extent of his memory deficit.    Labs reviewed: Basic Metabolic Panel:  Recent Labs  05/21/15 0523 05/22/15 0533  08/30/15 12/05/15 1055 12/12/15  NA 136 135  < > 137 139 139  K 3.4* 4.4  < > 4.4 4.3 4.5  CL 104 107  --   --  104  --   CO2 25 21*  --   --  24  --   GLUCOSE 137* 126*  --   --  210*  --   BUN 57* 33*  < > 25* 38* 42*  CREATININE 1.47* 0.92  < > 0.7 0.82 1.0  CALCIUM 8.8* 8.6*  --   --  9.2  --   <  > = values in this interval not displayed.  Liver Function Tests:  Recent Labs  05/03/15 1040  05/20/15 1341 06/06/15 08/17/15 08/30/15  AST 21  < > 35 27 25 17   ALT 11  < > 19 20 16  8*  ALKPHOS 66  < > 122 162* 156* 190*  BILITOT 0.8  --  0.9  --   --   --   PROT 7.1  --  6.8  --   --   --   ALBUMIN 3.9  --  3.1*  --   --   --   < > = values in this interval not displayed.  CBC:  Recent Labs  05/03/15 1040 05/20/15 1341 05/21/15 0523 05/22/15 0533  08/17/15 08/30/15 12/12/15  WBC 9.6 8.3 6.3 5.4  < > 10.5 8.2 5.3  NEUTROABS 6.8 5.9  --   --   --   --   --   --   HGB  --  13.1 11.5* 11.9*  < > 11.3* 11.1* 11.4*  HCT 42.1 39.0 34.6* 35.3*  < > 34* 34* 35*  MCV 93 90.9 90.1 90.1  --   --   --   --   PLT  --  182 147* 126*  < > 144* 239 133*  < > = values in this interval not displayed.  Lab Results  Component Value Date   TSH 3.16 06/06/2015   Lab Results  Component Value Date   HGBA1C 6.7* 06/06/2015   Lab Results  Component Value Date   CHOL 132 02/07/2015   HDL 33* 02/07/2015   LDLCALC 76 02/07/2015   LDLDIRECT 85.6 07/19/2010   TRIG 115 02/07/2015   CHOLHDL 4 03/06/2011    Significant Diagnostic Results since last visit: none  Patient Care Team: Estill Dooms, MD as PCP - General (Internal Medicine) Irine Seal, MD as Consulting Physician (Urology) Renella Cunas, MD as Consulting Physician (Cardiology) [facility] Lelon Perla, MD as Consulting Physician (Cardiology) Brixon Zhen X, NP as Nurse Practitioner (Nurse Practitioner) Rigoberto Noel, MD as Consulting Physician (Pulmonary Disease)  Assessment/Plan Problem List Items Addressed This Visit    Weight gain    May related to CHF, BNP 1449.1, weight 2x/week      Paroxysmal atrial fibrillation ([place])    Rate is in control w/o medications. Hold Eliquis until Hgb 7.6 01/02/16 is ruled out r/t GI bleed.       Memory loss (Chronic)    Progressing. off [place] R>B. SNF for care needs. Comfort  measures.       Iron deficiency anemia - Primary    12/12/15 Hgb 11.4 01/02/16 Hgb 7.6 01/03/16 adding Fe 325mg  daily, CBC in am, hold Eliquis, adding Omeprazole 20mg  daiy, dc Tylenol 100mg  bid      GERD    Adding Omeprazole 20mg  daily in setting of ? GI bleed, Hgb dropped from 11.4 12/12/15 to 7.6 01/02/16.       Essential hypertension (Chronic)    Controlled, off meds.       Chronic combined systolic and diastolic CHF (congestive heart failure) ([facility]) (Chronic)    Off Furosemide, BNP 1449.1 01/02/16. May resume Furosemide       Backache (Chronic)    Continue Fentanyl 48mcg/hr, dc Tylenol 1000mg -no c/o pain, reduce ? Anemia related GI irritation.           Family/ staff Communication: observe the RLE. Continue Hospice Service.   Labs/tests ordered: CBC in am, Guaiac stools x3  ManXie Bryauna Byrum NP [facility] [address], [place] [address] On Call:  [phone] & follow prompts after 5pm & weekends Office Phone:  [phone] Office Fax:  [phone]

## 2016-01-03 NOTE — Assessment & Plan Note (Signed)
May related to CHF, BNP 1449.1, weight 2x/week

## 2016-01-03 NOTE — Assessment & Plan Note (Signed)
12/12/15 Hgb 11.4 01/02/16 Hgb 7.6 01/03/16 adding Fe 325mg  daily, CBC in am, hold Eliquis, adding Omeprazole 20mg  daiy, dc Tylenol 100mg  bid

## 2016-01-03 NOTE — Assessment & Plan Note (Signed)
Off Furosemide, BNP 1449.1 01/02/16. May resume Furosemide

## 2016-01-03 NOTE — Assessment & Plan Note (Signed)
Continue Fentanyl 28mcg/hr, dc Tylenol 1000mg -no c/o pain, reduce ? Anemia related GI irritation.

## 2016-01-03 NOTE — Assessment & Plan Note (Signed)
Adding Omeprazole 20mg  daily in setting of ? GI bleed, Hgb dropped from 11.4 12/12/15 to 7.6 01/02/16.

## 2016-01-04 DIAGNOSIS — D649 Anemia, unspecified: Secondary | ICD-10-CM | POA: Diagnosis not present

## 2016-01-04 LAB — CBC AND DIFFERENTIAL
HEMATOCRIT: 23 % — AB (ref 41–53)
Hemoglobin: 7.4 g/dL — AB (ref 13.5–17.5)
Platelets: 167 10*3/uL (ref 150–399)
WBC: 5.2 10^3/mL

## 2016-01-05 DIAGNOSIS — R627 Adult failure to thrive: Secondary | ICD-10-CM | POA: Diagnosis not present

## 2016-01-05 LAB — CBC AND DIFFERENTIAL
HEMATOCRIT: 24 % — AB (ref 41–53)
HEMOGLOBIN: 7.6 g/dL — AB (ref 13.5–17.5)
Platelets: 160 10*3/uL (ref 150–399)
WBC: 5.6 10^3/mL

## 2016-01-09 DIAGNOSIS — D649 Anemia, unspecified: Secondary | ICD-10-CM | POA: Diagnosis not present

## 2016-01-09 LAB — CBC AND DIFFERENTIAL
HCT: 23 % — AB (ref 41–53)
Hemoglobin: 7.3 g/dL — AB (ref 13.5–17.5)
Platelets: 142 10*3/uL — AB (ref 150–399)
WBC: 4.2 10*3/mL

## 2016-01-10 ENCOUNTER — Encounter: Payer: Self-pay | Admitting: Nurse Practitioner

## 2016-01-10 ENCOUNTER — Non-Acute Institutional Stay (SKILLED_NURSING_FACILITY): Payer: Medicare Other | Admitting: Nurse Practitioner

## 2016-01-10 DIAGNOSIS — I10 Essential (primary) hypertension: Secondary | ICD-10-CM

## 2016-01-10 DIAGNOSIS — R635 Abnormal weight gain: Secondary | ICD-10-CM

## 2016-01-10 DIAGNOSIS — I480 Paroxysmal atrial fibrillation: Secondary | ICD-10-CM

## 2016-01-10 DIAGNOSIS — D509 Iron deficiency anemia, unspecified: Secondary | ICD-10-CM | POA: Diagnosis not present

## 2016-01-10 DIAGNOSIS — R413 Other amnesia: Secondary | ICD-10-CM

## 2016-01-10 DIAGNOSIS — M5440 Lumbago with sciatica, unspecified side: Secondary | ICD-10-CM | POA: Diagnosis not present

## 2016-01-10 DIAGNOSIS — K219 Gastro-esophageal reflux disease without esophagitis: Secondary | ICD-10-CM

## 2016-01-10 DIAGNOSIS — I5042 Chronic combined systolic (congestive) and diastolic (congestive) heart failure: Secondary | ICD-10-CM

## 2016-01-10 NOTE — Assessment & Plan Note (Signed)
Weight stable so far.

## 2016-01-10 NOTE — Assessment & Plan Note (Signed)
Controlled, off meds.  

## 2016-01-10 NOTE — Progress Notes (Signed)
Patient ID: Zachary Harris, male   DOB: 1923/11/20, 82 y.o.   MRN: [id number]  Location:  SNF [facility] Provider:  Marlana Latus NP  Code Status:  DNR Goals of care: Advanced Directive information    Chief Complaint  Patient presents with  . Medical Management of Chronic Issues  . Acute Visit    SOB, edema, moist rales mid to lower lungs.      HPI: Patient is a 82 y.o. male seen in the SNF at [facility] today for evaluation of  Afib, heart rate is in control, dementia, depression, HTN, anemia, Hgb 7.6, dropped from 11.4 12/22/15, repeated Hgb 7.3 01/09/16, no apparent bleeding noted, Eliquis/ASA held, fluid retention may contributory to the low Hgbs. CHF BNP 1449.1 01/02/16, the patient is apparently has increased edema, moist rales posterior mid to lower lungs, SOB easily with exertion.   Review of Systems  Constitutional: Negative for fever and chills.       Weight gain #10Ibs in the past month.   HENT: Positive for hearing loss. Negative for ear pain.   Eyes: Negative for photophobia and pain.       Corrective lenses  Respiratory: Positive for shortness of breath. Negative for cough and wheezing.   Cardiovascular: Positive for orthopnea, leg swelling and PND.       Pacemaker. History of syncope related to cardiac arrhythmia. Right lower leg edema 1+  Gastrointestinal: Negative for abdominal pain.  Genitourinary: Positive for urgency and frequency. Negative for dysuria and flank pain.       Underwent Urology evaluation in the past, off meds. Episodes of incontinence persist.  Musculoskeletal: Positive for myalgias, back pain and neck pain (Fall on 03/15/15 causing pain in the left knee and effusion.).       Poor balance and gait disturbance. Lower back pain Sever kyphosis  Skin:       Rough area on left cheek R+L buttocks, groins, sacrum excoriated areas are much improved Limited improvement 1/3 lower RLE redness, warmth, swelling, and pain with weight bearing   Neurological:  Negative for tremors, seizures and weakness.       Memory loss  Endo/Heme/Allergies: Negative for polydipsia.  Psychiatric/Behavioral:       Mild to moderate memory disturbance.    Past Medical History  Diagnosis Date  . ANEMIA-IRON DEFICIENCY 05/27/2007  . ANXIETY 11/14/2007  . Intermittent high-grade heart block 09/05/2009    s/p pacemaker  . BENIGN PROSTATIC HYPERTROPHY 11/14/2007  . CAROTID ARTERY DISEASE 10/02/2010  . Pacemaker-St. Jude 02/02/2008    St. Jude  . COLONIC POLYPS, HX OF 05/27/2007  . CORONARY ARTERY DISEASE 11/14/2007    prior bypass [phone]; Inferior MI 11/07; Severe 3 vessel disease; occluded SVG to RCA; SVG to OM subtotalled, SVG to diagonal with ostial 70 and 80 mid; EF 45. PCI of SVG to OM. FU myovue showed EF 56 with inferior infarct and very mild peri-infarct ischemia  . DEPRESSION 11/14/2007  . DIABETES MELLITUS, TYPE II 11/14/2007  . DIVERTICULITIS, HX OF 05/27/2007  . DVT (deep venous thrombosis) ([place]) 03/21/2011  . FOOT PAIN, BILATERAL 12/08/2010  . GERD 11/14/2007  . HYPERLIPIDEMIA 11/14/2007  . HYPERTENSION 11/14/2007  . HYPOTENSION, ORTHOSTATIC 03/06/2011  . Memory loss 06/01/2008  . OSTEOPOROSIS 06/02/2010  . PERIPHERAL VASCULAR DISEASE 11/14/2007  . RENAL INSUFFICIENCY 11/14/2007  . SECONDARY DM W/PERIPHERAL CIRC D/O UNCONTROLLED 11/07/2010  . SYNCOPE 09/08/2008  . Unspecified hearing loss 05/13/2009  . Prostate cancer ([place])   . Myalgia and myositis,  unspecified 04/24/2011  . Other malaise and fatigue 04/24/2011  . Peripheral neuropathy ([place]) 03/27/2011  . Seborrheic keratosis 06/05/2011  . Osteoarthrosis, unspecified whether generalized or localized, unspecified site   . Ischemic cardiomyopathy     a. echo (10/15):  mod focal basal septal hypertrophy, EF 40-45%, inf AK, Gr 1 DD, Al sclerosis, mild AI, MAC, mild MR, mod LAE, lipomatous hypertrophy, small effusion (no hemodynamic compromise)   . Effusion of left knee 03/17/2015  . Severe back pain   . DM  (diabetes mellitus), type 2 with renal complications ([place]) [id number]  . Failure to thrive in adult 05/03/2015  . Chronic combined systolic and diastolic CHF (congestive heart failure) ([place]) 10/10/2014  . CKD stage 3 due to type 1 diabetes mellitus ([place]) 03/17/2015  . Dyspnea 11/09/2014  . Hearing loss 05/13/2009    Qualifier: Diagnosis of  By: Jenny Reichmann MD, Hunt Oris   . Hyperlipemia 11/14/2007    Qualifier: Diagnosis of  By: Jenny Reichmann MD, Hunt Oris    . Generalized weakness 05/20/2015  . Protein-calorie malnutrition, severe ([place]) 05/23/2015  . Vertebral compression fracture ([place]) 09/05/2009    Qualifier: Diagnosis of  By: Jenny Reichmann MD, Hunt Oris   . [name] 04/04/2015  . Weight loss 10/10/2014    Patient Active Problem List   Diagnosis Date Noted  . Contusion of right lower leg 09/16/2015  . Cellulitis of leg, right 09/13/2015  . Fever presenting with conditions classified elsewhere 08/16/2015  . Candidiasis of skin 06/07/2015  . Constipation 05/27/2015  . Protein-calorie malnutrition, severe ([place]) 05/23/2015  . Palliative care encounter   . Compression fracture 05/20/2015  . Generalized weakness 05/20/2015  . Dehydration 05/20/2015  . DM (diabetes mellitus), type 2 with renal complications ([place]) [id number]  . Failure to thrive in adult 05/03/2015  . Weak 04/04/2015  . CKD stage 3 due to type 1 diabetes mellitus ([place]) 03/17/2015  . Chronic respiratory failure ([place]) 01/13/2015  . Dyspnea 11/09/2014  . Paroxysmal atrial fibrillation ([place]) 10/25/2014  . Ischemic cardiomyopathy 10/10/2014  . Chronic combined systolic and diastolic CHF (congestive heart failure) ([place]) 10/10/2014  . Weight gain 10/10/2014  . Other malaise and fatigue 08/16/2013  . Infection of urinary tract 08/16/2013  . Urine retention 08/16/2013  . Urge incontinence 08/16/2013  . Nocturia 08/16/2013  . Nodular prostate with urinary obstruction 08/16/2013  . DM type 2 with diabetic peripheral neuropathy ([place]) 04/28/2013  . Malignant  neoplasm of prostate ([place]) 02/25/2013  . HYPOTENSION, ORTHOSTATIC 03/06/2011  . Osteoporosis 06/02/2010  . Backache 09/05/2009  . Vertebral compression fracture ([place]) 09/05/2009  . Intermittent high-grade heart block 09/05/2009  . Hearing loss 05/13/2009  . Memory loss 06/01/2008  . Pacemaker-St.Jude 02/02/2008  . Hyperlipemia 11/14/2007  . ANXIETY 11/14/2007  . Depression 11/14/2007  . Essential hypertension 11/14/2007  . MYOCARDIAL INFARCTION, HX OF 11/14/2007  . CAD (coronary artery disease) 11/14/2007  . Peripheral vascular disease ([place]) 11/14/2007  . GERD 11/14/2007  . Iron deficiency anemia 05/27/2007  . COLONIC POLYPS, HX OF 05/27/2007    Allergies  Allergen Reactions  . Hydrocodone     hallucination  . Tramadol     hallucination    Medications: Patient's Medications  New Prescriptions   No medications on file  Previous Medications   ACETAMINOPHEN (TYLENOL) 500 MG TABLET    Take 2 tablets (1,000 mg total) by mouth 2 (two) times daily.   APIXABAN (ELIQUIS) 5 MG TABS TABLET    Take 1 tablet (5 mg total) by mouth 2 (two)  times daily.   FEEDING SUPPLEMENT, GLUCERNA SHAKE, (GLUCERNA SHAKE) LIQD    Take 237 mLs by mouth 2 (two) times daily between meals.   FENTANYL (DURAGESIC) 25 MCG/HR PATCH    Apply fresh patch every 3 days to help pain. Remove old patch.   LACTULOSE (CHRONULAC) 10 GM/15ML SOLUTION    Take 20 g by mouth every other day. Every other day as needed   NITROGLYCERIN (NITROSTAT) 0.4 MG SL TABLET    Place 0.4 mg under the tongue every 5 (five) minutes as needed for chest pain.  Modified Medications   No medications on file  Discontinued Medications   No medications on file    Physical Exam: Filed Vitals:   01/10/16 1122  BP: 104/60  Pulse: 70  Temp: 97.5 F (36.4 C)  TempSrc: Tympanic  Resp: 20   There is no weight on file to calculate BMI.  Physical Exam  Constitutional: He is oriented to person, place, and time.  Frail  HENT:  Head:  Normocephalic and atraumatic.  Nose: Nose normal.  Bilateral hearing aids.  Eyes: Conjunctivae are normal. Pupils are equal, round, and reactive to light.  Prescription lenses.  Neck: Normal range of motion. Neck supple. No JVD present. No thyromegaly present.  Cardiovascular: Normal rate and regular rhythm.  Exam reveals no gallop and no friction rub.   Murmur heard.  Systolic murmur is present with a grade of 1/6  Absent DP and PT  Pulmonary/Chest: Effort normal. He has no wheezes. He has rales. He exhibits no tenderness.  Using portable oxygen at 2 L/m today. Moist rales posterior mid to lower lungs.   Abdominal: Soft. Bowel sounds are normal. He exhibits no distension. There is no tenderness.  Musculoskeletal: Normal range of motion. He exhibits edema. He exhibits no tenderness.  Pain in the left SI joint area. Limps when walking. Favors the left side. Effusion of the left knee. Scars in the left knee from previous surgery. Tender at the left knee with movement of the patella. Positional lower back pain Server kyphosis RLE edema 1+  Neurological: He is alert and oriented to person, place, and time. He has normal strength. A sensory deficit is present. No cranial nerve deficit. Gait abnormal.  07/13/14 MMSE 28/30. Passed clock drawing. Diminshed sensation to vabration and monofilament.  Skin: Skin is warm and dry. No erythema. No pallor.  Rough area on the left cheek. Cystic lesion in the mid forehead. Mid sternal surgical scar. Left upper chest pacemaker. R+L buttocks, groins, sacrum excoriated areas are improved Limited improvement 1/3 lower RLE redness, warmth, swelling, and pain with weight bearing.     Psychiatric: He has a normal mood and affect. His behavior is normal. Thought content normal.  Patient is in denial regarding the extent of his memory deficit.    Labs reviewed: Basic Metabolic Panel:  Recent Labs  05/21/15 0523 05/22/15 0533  08/30/15 12/05/15 1055  12/12/15  NA 136 135  < > 137 139 139  K 3.4* 4.4  < > 4.4 4.3 4.5  CL 104 107  --   --  104  --   CO2 25 21*  --   --  24  --   GLUCOSE 137* 126*  --   --  210*  --   BUN 57* 33*  < > 25* 38* 42*  CREATININE 1.47* 0.92  < > 0.7 0.82 1.0  CALCIUM 8.8* 8.6*  --   --  9.2  --   < > =  values in this interval not displayed.  Liver Function Tests:  Recent Labs  05/03/15 1040  05/20/15 1341 06/06/15 08/17/15 08/30/15  AST 21  < > 35 27 25 17   ALT 11  < > 19 20 16  8*  ALKPHOS 66  < > 122 162* 156* 190*  BILITOT 0.8  --  0.9  --   --   --   PROT 7.1  --  6.8  --   --   --   ALBUMIN 3.9  --  3.1*  --   --   --   < > = values in this interval not displayed.  CBC:  Recent Labs  05/03/15 1040 05/20/15 1341 05/21/15 0523 05/22/15 0533  01/04/16 01/05/16 01/09/16  WBC 9.6 8.3 6.3 5.4  < > 5.2 5.6 4.2  NEUTROABS 6.8 5.9  --   --   --   --   --   --   HGB  --  13.1 11.5* 11.9*  < > 7.4* 7.6* 7.3*  HCT 42.1 39.0 34.6* 35.3*  < > 23* 24* 23*  MCV 93 90.9 90.1 90.1  --   --   --   --   PLT  --  182 147* 126*  < > 167 160 142*  < > = values in this interval not displayed.  Lab Results  Component Value Date   TSH 3.16 06/06/2015   Lab Results  Component Value Date   HGBA1C 6.7* 06/06/2015   Lab Results  Component Value Date   CHOL 132 02/07/2015   HDL 33* 02/07/2015   LDLCALC 76 02/07/2015   LDLDIRECT 85.6 07/19/2010   TRIG 115 02/07/2015   CHOLHDL 4 03/06/2011    Significant Diagnostic Results since last visit: none  Patient Care Team: Estill Dooms, MD as PCP - General (Internal Medicine) Irine Seal, MD as Consulting Physician (Urology) Renella Cunas, MD as Consulting Physician (Cardiology) [facility] Lelon Perla, MD as Consulting Physician (Cardiology) Delenn Ahn X, NP as Nurse Practitioner (Nurse Practitioner) Rigoberto Noel, MD as Consulting Physician (Pulmonary Disease)  Assessment/Plan Problem List Items Addressed This Visit    Weight gain     Weight stable so far.       Paroxysmal atrial fibrillation (HCC)    Rate is in control w/o medications. Hold Eliquis 2nd to Hgb 7s.       Memory loss (Chronic)    Progressing. off Namenda R>B. SNF for care needs. Comfort measures.       Iron deficiency anemia - Primary    01/04/16 Hgb 7.4 01/05/16 Hgb  7.6 01/09/16 Hgb 7.3 Adding Ferrous Sulfate 325mg  daily. Continue to hold Eliquis, repeat CBC      GERD    Stable, continue Omeprazole 20mg  daily,  Hgb dropped from 11.4 12/12/15 to 7.6 01/02/16.       Essential hypertension (Chronic)    Controlled, off meds.       Chronic combined systolic and diastolic CHF (congestive heart failure) (HCC) (Chronic)    Off Furosemide, BNP 1449.1 01/02/16. Noted increased edema, SOB, moist rales posterior mid to lower lungs, start Furosemide 20mg , Spironolactone 25mg  daily, update CMP.       Backache (Chronic)    Continue Fentanyl 3mcg/hr, off Tylenol 1000mg           Family/ staff Communication: observe the RLE. Continue Hospice Service.   Labs/tests ordered: CBC, CMP  ManXie Anuj Summons NP [facility]iatric[facility] [phone]  [place],  [id number] On Call:  [phone] & follow prompts after 5pm & weekends Office Phone:  [phone] Office Fax:  [phone]

## 2016-01-10 NOTE — Assessment & Plan Note (Signed)
Continue Fentanyl 25mcg/hr, off Tylenol 1000mg 

## 2016-01-10 NOTE — Assessment & Plan Note (Signed)
01/04/16 Hgb 7.4 01/05/16 Hgb  7.6 01/09/16 Hgb 7.3 Adding Ferrous Sulfate 325mg  daily. Continue to hold Eliquis, repeat CBC

## 2016-01-10 NOTE — Assessment & Plan Note (Signed)
Progressing. off Namenda R>B. SNF for care needs. Comfort measures.   

## 2016-01-10 NOTE — Assessment & Plan Note (Signed)
Off Furosemide, BNP 1449.1 01/02/16. Noted increased edema, SOB, moist rales posterior mid to lower lungs, start Furosemide 20mg , Spironolactone 25mg  daily, update CMP.

## 2016-01-10 NOTE — Assessment & Plan Note (Signed)
Rate is in control w/o medications. Hold Eliquis 2nd to Hgb 7s.

## 2016-01-10 NOTE — Assessment & Plan Note (Signed)
Stable, continue Omeprazole 20mg  daily,  Hgb dropped from 11.4 12/12/15 to 7.6 01/02/16.

## 2016-01-12 DIAGNOSIS — D649 Anemia, unspecified: Secondary | ICD-10-CM | POA: Diagnosis not present

## 2016-01-12 DIAGNOSIS — I10 Essential (primary) hypertension: Secondary | ICD-10-CM | POA: Diagnosis not present

## 2016-01-12 LAB — HEPATIC FUNCTION PANEL
ALK PHOS: 104 U/L (ref 25–125)
ALT: 10 U/L (ref 10–40)
AST: 18 U/L (ref 14–40)
Bilirubin, Total: 0.6 mg/dL

## 2016-01-12 LAB — BASIC METABOLIC PANEL
BUN: 48 mg/dL — AB (ref 4–21)
CREATININE: 1.1 mg/dL (ref 0.6–1.3)
Glucose: 127 mg/dL
Potassium: 4.6 mmol/L (ref 3.4–5.3)
Sodium: 137 mmol/L (ref 137–147)

## 2016-01-12 LAB — CBC AND DIFFERENTIAL
HCT: 23 % — AB (ref 41–53)
Hemoglobin: 7.3 g/dL — AB (ref 13.5–17.5)
PLATELETS: 135 10*3/uL — AB (ref 150–399)
WBC: 4.3 10*3/mL

## 2016-01-13 ENCOUNTER — Other Ambulatory Visit: Payer: Self-pay | Admitting: Nurse Practitioner

## 2016-01-13 DIAGNOSIS — I5042 Chronic combined systolic (congestive) and diastolic (congestive) heart failure: Secondary | ICD-10-CM

## 2016-01-13 DIAGNOSIS — N183 Chronic kidney disease, stage 3 unspecified: Secondary | ICD-10-CM

## 2016-01-13 DIAGNOSIS — E43 Unspecified severe protein-calorie malnutrition: Secondary | ICD-10-CM

## 2016-01-13 DIAGNOSIS — E1022 Type 1 diabetes mellitus with diabetic chronic kidney disease: Secondary | ICD-10-CM

## 2016-01-13 DIAGNOSIS — D509 Iron deficiency anemia, unspecified: Secondary | ICD-10-CM

## 2016-01-16 DIAGNOSIS — D649 Anemia, unspecified: Secondary | ICD-10-CM | POA: Diagnosis not present

## 2016-01-16 LAB — CBC AND DIFFERENTIAL
HEMATOCRIT: 25 % — AB (ref 41–53)
Hemoglobin: 7.7 g/dL — AB (ref 13.5–17.5)
PLATELETS: 127 10*3/uL — AB (ref 150–399)
WBC: 4.4 10*3/mL

## 2016-01-17 DIAGNOSIS — D509 Iron deficiency anemia, unspecified: Secondary | ICD-10-CM

## 2016-01-19 DIAGNOSIS — N390 Urinary tract infection, site not specified: Secondary | ICD-10-CM | POA: Diagnosis not present

## 2016-01-23 DIAGNOSIS — E860 Dehydration: Secondary | ICD-10-CM | POA: Diagnosis not present

## 2016-01-23 DIAGNOSIS — N179 Acute kidney failure, unspecified: Secondary | ICD-10-CM | POA: Diagnosis not present

## 2016-01-23 LAB — CBC AND DIFFERENTIAL
HEMATOCRIT: 27 % — AB (ref 41–53)
HEMOGLOBIN: 8.3 g/dL — AB (ref 13.5–17.5)
Platelets: 172 10*3/uL (ref 150–399)
WBC: 5 10*3/mL

## 2016-01-24 ENCOUNTER — Encounter: Payer: Self-pay | Admitting: Internal Medicine

## 2016-01-30 ENCOUNTER — Ambulatory Visit (INDEPENDENT_AMBULATORY_CARE_PROVIDER_SITE_OTHER): Payer: Medicare Other | Admitting: *Deleted

## 2016-01-30 DIAGNOSIS — I442 Atrioventricular block, complete: Secondary | ICD-10-CM

## 2016-01-30 DIAGNOSIS — N179 Acute kidney failure, unspecified: Secondary | ICD-10-CM | POA: Diagnosis not present

## 2016-01-30 LAB — CBC AND DIFFERENTIAL
HCT: 30 % — AB (ref 41–53)
Hemoglobin: 8.8 g/dL — AB (ref 13.5–17.5)
PLATELETS: 198 10*3/uL (ref 150–399)
WBC: 4.7 10*3/mL

## 2016-01-31 NOTE — Progress Notes (Signed)
Remote pacemaker transmission.   

## 2016-02-06 DIAGNOSIS — R627 Adult failure to thrive: Secondary | ICD-10-CM | POA: Diagnosis not present

## 2016-02-06 LAB — CBC AND DIFFERENTIAL
HEMATOCRIT: 33 % — AB (ref 41–53)
HEMOGLOBIN: 10.1 g/dL — AB (ref 13.5–17.5)
Platelets: 192 10*3/uL (ref 150–399)
WBC: 5.8 10^3/mL

## 2016-02-07 ENCOUNTER — Encounter: Payer: Self-pay | Admitting: Nurse Practitioner

## 2016-02-07 ENCOUNTER — Non-Acute Institutional Stay (SKILLED_NURSING_FACILITY): Payer: Medicare Other | Admitting: Nurse Practitioner

## 2016-02-07 DIAGNOSIS — F329 Major depressive disorder, single episode, unspecified: Secondary | ICD-10-CM

## 2016-02-07 DIAGNOSIS — I10 Essential (primary) hypertension: Secondary | ICD-10-CM | POA: Diagnosis not present

## 2016-02-07 DIAGNOSIS — D509 Iron deficiency anemia, unspecified: Secondary | ICD-10-CM | POA: Diagnosis not present

## 2016-02-07 DIAGNOSIS — K219 Gastro-esophageal reflux disease without esophagitis: Secondary | ICD-10-CM

## 2016-02-07 DIAGNOSIS — K5900 Constipation, unspecified: Secondary | ICD-10-CM

## 2016-02-07 DIAGNOSIS — E1022 Type 1 diabetes mellitus with diabetic chronic kidney disease: Secondary | ICD-10-CM | POA: Diagnosis not present

## 2016-02-07 DIAGNOSIS — E1142 Type 2 diabetes mellitus with diabetic polyneuropathy: Secondary | ICD-10-CM | POA: Diagnosis not present

## 2016-02-07 DIAGNOSIS — R413 Other amnesia: Secondary | ICD-10-CM | POA: Diagnosis not present

## 2016-02-07 DIAGNOSIS — F32A Depression, unspecified: Secondary | ICD-10-CM

## 2016-02-07 DIAGNOSIS — M5440 Lumbago with sciatica, unspecified side: Secondary | ICD-10-CM | POA: Diagnosis not present

## 2016-02-07 DIAGNOSIS — I5042 Chronic combined systolic (congestive) and diastolic (congestive) heart failure: Secondary | ICD-10-CM | POA: Diagnosis not present

## 2016-02-07 DIAGNOSIS — I480 Paroxysmal atrial fibrillation: Secondary | ICD-10-CM

## 2016-02-07 DIAGNOSIS — N183 Chronic kidney disease, stage 3 unspecified: Secondary | ICD-10-CM

## 2016-02-07 NOTE — Assessment & Plan Note (Addendum)
Off Furosemide, BNP 1449.1, improved from 01/02/16. Noted increased edema, SOB, moist rales posterior mid to lower lungs, resumed Furosemide 20mg , continue Spironolactone 25mg  daily

## 2016-02-07 NOTE — Assessment & Plan Note (Signed)
Stable, continue Omeprazole 20mg  daily,  Hgb dropped from 11.4 12/12/15 to 7.6 01/02/16, resolved, last Hgb 10s, continue to be off ASA/Elliquis.

## 2016-02-07 NOTE — Assessment & Plan Note (Signed)
Controlled, continue Furosemide and Spironolactone.   

## 2016-02-07 NOTE — Assessment & Plan Note (Signed)
Stable, continue 06/10/15 Lactulose prn. 

## 2016-02-07 NOTE — Assessment & Plan Note (Signed)
Rate is in control w/o medications. Hold Eliquis 2nd to Hgb 7s. Recommend to dc it.

## 2016-02-07 NOTE — Assessment & Plan Note (Signed)
Mood is stable, not on a mood stabilizer.  

## 2016-02-07 NOTE — Assessment & Plan Note (Signed)
06/06/15 Hgb A1c 6.7 diet control.   

## 2016-02-07 NOTE — Assessment & Plan Note (Signed)
Continue Fentanyl 25mcg/hr, off Tylenol 1000mg 

## 2016-02-07 NOTE — Assessment & Plan Note (Signed)
02/06/16 Hgb 10.1, recommend to continue off ASA/Elliquis.

## 2016-02-07 NOTE — Assessment & Plan Note (Signed)
Progressing. off Namenda R>B. SNF for care needs. Comfort measures.   

## 2016-02-07 NOTE — Progress Notes (Signed)
Patient ID: Zachary Harris, male   DOB: 14-Jul-1923, 82 y.o.   MRN: [id number]  Location:  SNF [facility] Provider:  Marlana Latus NP  Code Status:  DNR Goals of care: Advanced Directive information Does patient have an advance directive?: Yes, Type of Advance Directive: [place];Out of facility DNR (pink MOST or yellow form)  Chief Complaint  Patient presents with  . Medical Management of Chronic Issues    Routine Visit     HPI: Patient is a 82 y.o. male seen in the SNF at [facility] today for evaluation of  Afib, heart rate is in control, dementia, depression, HTN, anemia, Hgb 7.6, dropped from 11.4 12/22/15, repeated Hgb 7.3 01/09/16, no apparent bleeding noted, Eliquis/ASA held, fluid retention may contributory to the low Hgbs., improved to 10s after the patient is gently diuresed and ASA/Elliquis held.  CHF BNP 1449.1 01/02/16, the patient is apparently has increased edema, moist rales posterior mid to lower lungs, SOB easily with exertion.   Review of Systems  Constitutional: Negative for fever and chills.       Weight gain #10Ibs in the past month.   HENT: Positive for hearing loss. Negative for ear pain.   Eyes: Negative for photophobia and pain.       Corrective lenses  Respiratory: Positive for shortness of breath. Negative for cough and wheezing.   Cardiovascular: Positive for orthopnea, leg swelling trace and PND.       Pacemaker. History of syncope related to cardiac arrhythmia. Right lower leg edema 1+  Gastrointestinal: Negative for abdominal pain.  Genitourinary: Positive for urgency and frequency. Negative for dysuria and flank pain.       Underwent Urology evaluation in the past, off meds. Episodes of incontinence persist.  Musculoskeletal: Positive for myalgias, back pain and neck pain (Fall on 03/15/15 causing pain in the left knee and effusion.).       Poor balance and gait disturbance. Lower back pain Sever kyphosis  Skin:       Rough area on left  cheek R+L buttocks, groins, sacrum excoriated areas are much improved Limited improvement 1/3 lower RLE redness, warmth, swelling, and pain with weight bearing   Neurological: Negative for tremors, seizures and weakness.       Memory loss  Endo/Heme/Allergies: Negative for polydipsia.  Psychiatric/Behavioral:       Mild to moderate memory disturbance.    Past Medical History  Diagnosis Date  . ANEMIA-IRON DEFICIENCY 05/27/2007  . ANXIETY 11/14/2007  . Intermittent high-grade heart block 09/05/2009    s/p pacemaker  . BENIGN PROSTATIC HYPERTROPHY 11/14/2007  . CAROTID ARTERY DISEASE 10/02/2010  . Pacemaker-St. Jude 02/02/2008    St. Jude  . COLONIC POLYPS, HX OF 05/27/2007  . CORONARY ARTERY DISEASE 11/14/2007    prior bypass [phone]; Inferior MI 11/07; Severe 3 vessel disease; occluded SVG to RCA; SVG to OM subtotalled, SVG to diagonal with ostial 70 and 80 mid; EF 45. PCI of SVG to OM. FU myovue showed EF 56 with inferior infarct and very mild peri-infarct ischemia  . DEPRESSION 11/14/2007  . DIABETES MELLITUS, TYPE II 11/14/2007  . DIVERTICULITIS, HX OF 05/27/2007  . DVT (deep venous thrombosis) ([place]) 03/21/2011  . FOOT PAIN, BILATERAL 12/08/2010  . GERD 11/14/2007  . HYPERLIPIDEMIA 11/14/2007  . HYPERTENSION 11/14/2007  . HYPOTENSION, ORTHOSTATIC 03/06/2011  . Memory loss 06/01/2008  . OSTEOPOROSIS 06/02/2010  . PERIPHERAL VASCULAR DISEASE 11/14/2007  . RENAL INSUFFICIENCY 11/14/2007  . SECONDARY DM W/PERIPHERAL CIRC  D/O UNCONTROLLED 11/07/2010  . SYNCOPE 09/08/2008  . Unspecified hearing loss 05/13/2009  . Prostate cancer ([place])   . Myalgia and myositis, unspecified 04/24/2011  . Other malaise and fatigue 04/24/2011  . Peripheral neuropathy ([place]) 03/27/2011  . Seborrheic keratosis 06/05/2011  . Osteoarthrosis, unspecified whether generalized or localized, unspecified site   . Ischemic cardiomyopathy     a. echo (10/15):  mod focal basal septal hypertrophy, EF 40-45%, inf AK, Gr 1 DD, Al  sclerosis, mild AI, MAC, mild MR, mod LAE, lipomatous hypertrophy, small effusion (no hemodynamic compromise)   . Effusion of left knee 03/17/2015  . Severe back pain   . DM (diabetes mellitus), type 2 with renal complications ([employer]) [id number]  . Failure to thrive in adult 05/03/2015  . Chronic combined systolic and diastolic CHF (congestive heart failure) ([place]) 10/10/2014  . CKD stage 3 due to type 1 diabetes mellitus ([place]) 03/17/2015  . Dyspnea 11/09/2014  . Hearing loss 05/13/2009    Qualifier: Diagnosis of  By: Jenny Reichmann MD, Hunt Oris   . Hyperlipemia 11/14/2007    Qualifier: Diagnosis of  By: Jenny Reichmann MD, Hunt Oris    . Generalized weakness 05/20/2015  . Protein-calorie malnutrition, severe ([place]) 05/23/2015  . Vertebral compression fracture ([place]) 09/05/2009    Qualifier: Diagnosis of  By: Jenny Reichmann MD, Hunt Oris   . [name] 04/04/2015  . Weight loss 10/10/2014    Patient Active Problem List   Diagnosis Date Noted  . Contusion of right lower leg 09/16/2015  . Cellulitis of leg, right 09/13/2015  . Fever presenting with conditions classified elsewhere 08/16/2015  . Candidiasis of skin 06/07/2015  . Constipation 05/27/2015  . Protein-calorie malnutrition, severe ([place]) 05/23/2015  . Palliative care encounter   . Compression fracture 05/20/2015  . Generalized weakness 05/20/2015  . Dehydration 05/20/2015  . DM (diabetes mellitus), type 2 with renal complications ([place]) [id number]  . Failure to thrive in adult 05/03/2015  . Weak 04/04/2015  . CKD stage 3 due to type 1 diabetes mellitus ([place]) 03/17/2015  . Chronic respiratory failure ([place]) 01/13/2015  . Dyspnea 11/09/2014  . Paroxysmal atrial fibrillation ([place]) 10/25/2014  . Ischemic cardiomyopathy 10/10/2014  . Chronic combined systolic and diastolic CHF (congestive heart failure) ([place]) 10/10/2014  . Weight gain 10/10/2014  . Other malaise and fatigue 08/16/2013  . Infection of urinary tract 08/16/2013  . Urine retention 08/16/2013  . Urge incontinence  08/16/2013  . Nocturia 08/16/2013  . Nodular prostate with urinary obstruction 08/16/2013  . DM type 2 with diabetic peripheral neuropathy ([place]) 04/28/2013  . Malignant neoplasm of prostate ([place]) 02/25/2013  . HYPOTENSION, ORTHOSTATIC 03/06/2011  . Osteoporosis 06/02/2010  . Backache 09/05/2009  . Vertebral compression fracture ([place]) 09/05/2009  . Intermittent high-grade heart block 09/05/2009  . Hearing loss 05/13/2009  . Memory loss 06/01/2008  . Pacemaker-St.Jude 02/02/2008  . Hyperlipemia 11/14/2007  . ANXIETY 11/14/2007  . Depression 11/14/2007  . Essential hypertension 11/14/2007  . MYOCARDIAL INFARCTION, HX OF 11/14/2007  . CAD (coronary artery disease) 11/14/2007  . Peripheral vascular disease ([place]) 11/14/2007  . GERD 11/14/2007  . Iron deficiency anemia 05/27/2007  . COLONIC POLYPS, HX OF 05/27/2007    Allergies  Allergen Reactions  . Hydrocodone     hallucination  . Tramadol     hallucination    Medications: Patient's Medications  New Prescriptions   No medications on file  Previous Medications   AMINO ACIDS-PROTEIN HYDROLYS (FEEDING SUPPLEMENT, PRO-STAT SUGAR FREE 64,) LIQD    Take 30 mLs by  mouth 2 (two) times daily with a meal.   APIXABAN (ELIQUIS) 5 MG TABS TABLET    Take 1 tablet (5 mg total) by mouth 2 (two) times daily.   DOCUSATE SODIUM (COLACE) 100 MG CAPSULE    Take 100 mg by mouth 2 (two) times daily.   FEEDING SUPPLEMENT, GLUCERNA SHAKE, (GLUCERNA SHAKE) LIQD    Take 237 mLs by mouth 2 (two) times daily between meals.   FENTANYL (DURAGESIC) 25 MCG/HR PATCH    Apply fresh patch every 3 days to help pain. Remove old patch.   FERROUS SULFATE 325 (65 FE) MG TABLET    Take 325 mg by mouth daily with breakfast.   FUROSEMIDE (LASIX) 20 MG TABLET    Take 20 mg by mouth daily.   NITROGLYCERIN (NITROSTAT) 0.4 MG SL TABLET    Place 0.4 mg under the tongue every 5 (five) minutes as needed for chest pain.   OMEPRAZOLE (PRILOSEC) 20 MG CAPSULE    Take 20 mg by  mouth daily.   SPIRONOLACTONE (ALDACTONE) 25 MG TABLET    Take 25 mg by mouth daily.  Modified Medications   No medications on file  Discontinued Medications   ACETAMINOPHEN (TYLENOL) 500 MG TABLET    Take 2 tablets (1,000 mg total) by mouth 2 (two) times daily.   LACTULOSE (CHRONULAC) 10 GM/15ML SOLUTION    Take 20 g by mouth every other day. Every other day as needed    Physical Exam: Filed Vitals:   02/07/16 0812  BP: 111/58  Pulse: 77  Temp: 97.4 F (36.3 C)  TempSrc: Oral  Resp: 20  Height: 5' 9.5" (1.765 m)  Weight: 172 lb (78.019 kg)  SpO2: 96%   Body mass index is 25.04 kg/(m^2).  Physical Exam  Constitutional: He is oriented to person, place, and time.  Frail  HENT:  Head: Normocephalic and atraumatic.  Nose: Nose normal.  Bilateral hearing aids.  Eyes: Conjunctivae are normal. Pupils are equal, round, and reactive to light.  Prescription lenses.  Neck: Normal range of motion. Neck supple. No JVD present. No thyromegaly present.  Cardiovascular: Normal rate and regular rhythm.  Exam reveals no gallop and no friction rub.   Murmur heard.  Systolic murmur is present with a grade of 1/6  Absent DP and PT  Pulmonary/Chest: Effort normal. He has no wheezes. He has rales. He exhibits no tenderness.  Using portable oxygen at 2 L/m today. Moist rales posterior mid to lower lungs.   Abdominal: Soft. Bowel sounds are normal. He exhibits no distension. There is no tenderness.  Musculoskeletal: Normal range of motion. He exhibits edema improved, trace in ankles mainly. He exhibits no tenderness.  Pain in the left SI joint area. Limps when walking. Favors the left side. Effusion of the left knee. Scars in the left knee from previous surgery. Tender at the left knee with movement of the patella. Positional lower back pain Server kyphosis RLE edema 1+  Neurological: He is alert and oriented to person, place, and time. He has normal strength. A sensory deficit is present. No  cranial nerve deficit. Gait abnormal.  07/13/14 MMSE 28/30. Passed clock drawing. Diminshed sensation to vabration and monofilament.  Skin: Skin is warm and dry. No erythema. No pallor.  Rough area on the left cheek. Cystic lesion in the mid forehead. Mid sternal surgical scar. Left upper chest pacemaker. R+L buttocks, groins, sacrum excoriated areas are improved Limited improvement 1/3 lower RLE redness, warmth, swelling, and pain with weight bearing.  Psychiatric: He has a normal mood and affect. His behavior is normal. Thought content normal.  Patient is in denial regarding the extent of his memory deficit.    Labs reviewed: Basic Metabolic Panel:  Recent Labs  05/21/15 0523 05/22/15 0533  12/05/15 1055 12/12/15 01/12/16  NA 136 135  < > 139 139 137  K 3.4* 4.4  < > 4.3 4.5 4.6  CL 104 107  --  104  --   --   CO2 25 21*  --  24  --   --   GLUCOSE 137* 126*  --  210*  --   --   BUN 57* 33*  < > 38* 42* 48*  CREATININE 1.47* 0.92  < > 0.82 1.0 1.1  CALCIUM 8.8* 8.6*  --  9.2  --   --   < > = values in this interval not displayed.  Liver Function Tests:  Recent Labs  05/03/15 1040  05/20/15 1341  08/17/15 08/30/15 01/12/16  AST 21  < > 35  < > 25 17 18   ALT 11  < > 19  < > 16 8* 10  ALKPHOS 66  < > 122  < > 156* 190* 104  BILITOT 0.8  --  0.9  --   --   --   --   PROT 7.1  --  6.8  --   --   --   --   ALBUMIN 3.9  --  3.1*  --   --   --   --   < > = values in this interval not displayed.  CBC:  Recent Labs  05/03/15 1040 05/20/15 1341 05/21/15 0523 05/22/15 0533  01/16/16 01/23/16 01/30/16  WBC 9.6 8.3 6.3 5.4  < > 4.4 5.0 4.7  NEUTROABS 6.8 5.9  --   --   --   --   --   --   HGB  --  13.1 11.5* 11.9*  < > 7.7* 8.3* 8.8*  HCT 42.1 39.0 34.6* 35.3*  < > 25* 27* 30*  MCV 93 90.9 90.1 90.1  --   --   --   --   PLT  --  182 147* 126*  < > 127* 172 198  < > = values in this interval not displayed.  Lab Results  Component Value Date   TSH 3.16 06/06/2015    Lab Results  Component Value Date   HGBA1C 6.7* 06/06/2015   Lab Results  Component Value Date   CHOL 132 02/07/2015   HDL 33* 02/07/2015   LDLCALC 76 02/07/2015   LDLDIRECT 85.6 07/19/2010   TRIG 115 02/07/2015   CHOLHDL 4 03/06/2011    Significant Diagnostic Results since last visit: none  Patient Care Team: Estill Dooms, MD as PCP - General (Internal Medicine) Irine Seal, MD as Consulting Physician (Urology) Renella Cunas, MD as Consulting Physician (Cardiology) [facility] Lelon Perla, MD as Consulting Physician (Cardiology) Rivan Siordia X, NP as Nurse Practitioner (Nurse Practitioner) Rigoberto Noel, MD as Consulting Physician (Pulmonary Disease)  Assessment/Plan Problem List Items Addressed This Visit    Depression (Chronic)    Mood is stable, not on a mood stabilizer.       Essential hypertension (Chronic)    Controlled, continue Furosemide and Spironolactone      Relevant Medications   furosemide (LASIX) 20 MG tablet   spironolactone (ALDACTONE) 25 MG tablet   Backache (Chronic)    Continue Fentanyl  57mcg/hr, off Tylenol 1000mg       Memory loss (Chronic)    Progressing. off Namenda R>B. SNF for care needs. Comfort measures.       DM type 2 with diabetic peripheral neuropathy (HCC) (Chronic)    06/06/15 Hgb A1c 6.7 diet control      Chronic combined systolic and diastolic CHF (congestive heart failure) (HCC) - Primary (Chronic)    Off Furosemide, BNP 1449.1, improved from 01/02/16. Noted increased edema, SOB, moist rales posterior mid to lower lungs, resumed Furosemide 20mg , continue Spironolactone 25mg  daily      Relevant Medications   furosemide (LASIX) 20 MG tablet   spironolactone (ALDACTONE) 25 MG tablet   CKD stage 3 due to type 1 diabetes mellitus (HCC) (Chronic)   Iron deficiency anemia    02/06/16 Hgb 10.1, recommend to continue off ASA/Elliquis.       Relevant Medications   ferrous sulfate 325 (65 FE) MG tablet   GERD     Stable, continue Omeprazole 20mg  daily,  Hgb dropped from 11.4 12/12/15 to 7.6 01/02/16, resolved, last Hgb 10s, continue to be off ASA/Elliquis.       Relevant Medications   omeprazole (PRILOSEC) 20 MG capsule   docusate sodium (COLACE) 100 MG capsule   Paroxysmal atrial fibrillation (HCC)    Rate is in control w/o medications. Hold Eliquis 2nd to Hgb 7s. Recommend to dc it.       Relevant Medications   furosemide (LASIX) 20 MG tablet   spironolactone (ALDACTONE) 25 MG tablet   Constipation    Stable, continue 06/10/15 Lactulose prn.          Family/ staff Communication: observe the RLE. Continue Hospice Service. Dc ASA/Elliquis.   Labs/tests ordered: CMP, BNP  ManXie Makenzee Choudhry NP Geriatric[facility] [address],  [address] On Call:  [phone] & follow prompts after 5pm & weekends Office Phone:  [phone] Office Fax:  [phone]

## 2016-02-09 DIAGNOSIS — I5042 Chronic combined systolic (congestive) and diastolic (congestive) heart failure: Secondary | ICD-10-CM | POA: Diagnosis not present

## 2016-02-09 DIAGNOSIS — N179 Acute kidney failure, unspecified: Secondary | ICD-10-CM | POA: Diagnosis not present

## 2016-02-09 LAB — HEPATIC FUNCTION PANEL
ALK PHOS: 210 U/L — AB (ref 25–125)
ALT: 15 U/L (ref 10–40)
AST: 25 U/L (ref 14–40)
Bilirubin, Total: 0.7 mg/dL

## 2016-02-09 LAB — BASIC METABOLIC PANEL
BUN: 50 mg/dL — AB (ref 4–21)
CREATININE: 1.3 mg/dL (ref 0.6–1.3)
Glucose: 149 mg/dL
Potassium: 4.2 mmol/L (ref 3.4–5.3)
SODIUM: 139 mmol/L (ref 137–147)

## 2016-02-13 DIAGNOSIS — N179 Acute kidney failure, unspecified: Secondary | ICD-10-CM | POA: Diagnosis not present

## 2016-02-16 DIAGNOSIS — Z961 Presence of intraocular lens: Secondary | ICD-10-CM | POA: Diagnosis not present

## 2016-02-20 DIAGNOSIS — E1122 Type 2 diabetes mellitus with diabetic chronic kidney disease: Secondary | ICD-10-CM | POA: Diagnosis not present

## 2016-02-20 LAB — CBC AND DIFFERENTIAL
HCT: 37 % — AB (ref 41–53)
HEMOGLOBIN: 11.2 g/dL — AB (ref 13.5–17.5)
Platelets: 194 10*3/uL (ref 150–399)
WBC: 5.9 10^3/mL

## 2016-02-28 ENCOUNTER — Encounter: Payer: Self-pay | Admitting: Nurse Practitioner

## 2016-02-28 ENCOUNTER — Non-Acute Institutional Stay (SKILLED_NURSING_FACILITY): Payer: Medicare Other | Admitting: Nurse Practitioner

## 2016-02-28 DIAGNOSIS — I10 Essential (primary) hypertension: Secondary | ICD-10-CM

## 2016-02-28 DIAGNOSIS — M5440 Lumbago with sciatica, unspecified side: Secondary | ICD-10-CM

## 2016-02-28 DIAGNOSIS — E1142 Type 2 diabetes mellitus with diabetic polyneuropathy: Secondary | ICD-10-CM

## 2016-02-28 DIAGNOSIS — K5900 Constipation, unspecified: Secondary | ICD-10-CM | POA: Diagnosis not present

## 2016-02-28 DIAGNOSIS — D509 Iron deficiency anemia, unspecified: Secondary | ICD-10-CM

## 2016-02-28 DIAGNOSIS — I480 Paroxysmal atrial fibrillation: Secondary | ICD-10-CM

## 2016-02-28 DIAGNOSIS — I5042 Chronic combined systolic (congestive) and diastolic (congestive) heart failure: Secondary | ICD-10-CM

## 2016-02-28 DIAGNOSIS — F32A Depression, unspecified: Secondary | ICD-10-CM

## 2016-02-28 DIAGNOSIS — N183 Chronic kidney disease, stage 3 unspecified: Secondary | ICD-10-CM

## 2016-02-28 DIAGNOSIS — R413 Other amnesia: Secondary | ICD-10-CM

## 2016-02-28 DIAGNOSIS — F329 Major depressive disorder, single episode, unspecified: Secondary | ICD-10-CM

## 2016-02-28 DIAGNOSIS — K219 Gastro-esophageal reflux disease without esophagitis: Secondary | ICD-10-CM | POA: Diagnosis not present

## 2016-02-28 DIAGNOSIS — E1022 Type 1 diabetes mellitus with diabetic chronic kidney disease: Secondary | ICD-10-CM

## 2016-02-28 NOTE — Assessment & Plan Note (Signed)
Off Furosemide, BNP 1449.1, improved from 01/02/16. Noted increased edema, SOB, moist rales posterior mid to lower lungs, resumed Furosemide 20mg , continue Spironolactone 25mg  daily

## 2016-02-28 NOTE — Assessment & Plan Note (Signed)
Rate is in control w/o medications. off Eliquis 2nd to Hgb 7s.  

## 2016-02-28 NOTE — Assessment & Plan Note (Signed)
Continue Fentanyl 25mcg/hr, off Tylenol 1000mg 

## 2016-02-28 NOTE — Assessment & Plan Note (Signed)
Progressing. off Namenda R>B. SNF for care needs. Comfort measures.   

## 2016-02-28 NOTE — Assessment & Plan Note (Signed)
Controlled, continue Furosemide 20mg  and Spironolactone 25mg  daily.

## 2016-02-28 NOTE — Assessment & Plan Note (Signed)
Stable, continue Omeprazole 20mg  daily,  Hgb dropped from 11.4 12/12/15 to 7.6 01/02/16, resolved, last Hgb 10s, continue to be off ASA/Elliquis.

## 2016-02-28 NOTE — Assessment & Plan Note (Signed)
Stable, continue 06/10/15 Lactulose prn. Colace bid.  

## 2016-02-28 NOTE — Assessment & Plan Note (Signed)
02/06/16 Hgb 10.1, 02/20/16 Hgb 11.2, continue Fe, continue off ASA/Elliquis.

## 2016-02-28 NOTE — Assessment & Plan Note (Signed)
06/06/15 Hgb A1c 6.7 diet control.   

## 2016-02-28 NOTE — Assessment & Plan Note (Signed)
Mood is stable, not on a mood stabilizer.  

## 2016-02-28 NOTE — Assessment & Plan Note (Signed)
02/09/16 Bun 50, creat 1.27

## 2016-02-28 NOTE — Progress Notes (Signed)
Patient ID: Zachary Harris, male   DOB: 17-Sep-1923, 82 y.o.   MRN: [id number]  Location:  [place] Room Number: 9 Place of Service:  SNF (31) Provider: Lennie Odor Lakitha Gordy NP  GREEN, Viviann Spare, MD  Patient Care Team: Estill Dooms, MD as PCP - General (Internal Medicine) Irine Seal, MD as Consulting Physician (Urology) Renella Cunas, MD as Consulting Physician (Cardiology) [facility] Lelon Perla, MD as Consulting Physician (Cardiology) Shawn Carattini X, NP as Nurse Practitioner (Nurse Practitioner) Rigoberto Noel, MD as Consulting Physician (Pulmonary Disease)  Extended Emergency Contact Information Primary Emergency Contact: Zachary Harris, Zachary Harris of [place] Phone: [phone] Relation: Daughter Secondary Emergency Contact: Zachary Harris [place] of [place] Phone: [phone] Mobile Phone: [phone] Relation: Son  Code Status:  DNR Goals of care: Advanced Directive information Advanced Directives 02/28/2016  Does patient have an advance directive? Yes  Type of Paramedic of [place];Out of facility DNR (pink MOST or yellow form)  Does patient want to make changes to advanced directive? No - Patient declined  Copy of advanced directive(s) in chart? Yes     Chief Complaint  Patient presents with  . Medical Management of Chronic Issues    Routine visit    HPI:  Pt is a 82 y.o. male seen today for medical management of chronic diseases.  Afib, heart rate is in control, dementia, depression, stable,  HTN, controlled while on furosemide 20mg  and Spironolactone 25mg , anemia, Hgb 7.6, dropped from 11.4 12/22/15, repeated Hgb 7.3 01/09/16, no apparent bleeding noted, Eliquis/ASA held, fluid retention may contributory to the low Hgbs., improved to 10s after the patient is gently diuresed and ASA/Elliquis dc'd, on Fe 325mg  daily. CHF BNP 1449.1 01/02/16, the patient is apparently has increased  edema, moist rales posterior mid to lower lungs, SOB easily with exertion, no significant change since last seen, on Furosemide 20mg  and Spironolactone 25mg  daily.    Past Medical History  Diagnosis Date  . ANEMIA-IRON DEFICIENCY 05/27/2007  . ANXIETY 11/14/2007  . Intermittent high-grade heart block 09/05/2009    s/p pacemaker  . BENIGN PROSTATIC HYPERTROPHY 11/14/2007  . CAROTID ARTERY DISEASE 10/02/2010  . Pacemaker-St. Jude 02/02/2008    St. Jude  . COLONIC POLYPS, HX OF 05/27/2007  . CORONARY ARTERY DISEASE 11/14/2007    prior bypass [phone]; Inferior MI 11/07; Severe 3 vessel disease; occluded SVG to RCA; SVG to OM subtotalled, SVG to diagonal with ostial 70 and 80 mid; EF 45. PCI of SVG to OM. FU myovue showed EF 56 with inferior infarct and very mild peri-infarct ischemia  . DEPRESSION 11/14/2007  . DIABETES MELLITUS, TYPE II 11/14/2007  . DIVERTICULITIS, HX OF 05/27/2007  . DVT (deep venous thrombosis) ([place]) 03/21/2011  . FOOT PAIN, BILATERAL 12/08/2010  . GERD 11/14/2007  . HYPERLIPIDEMIA 11/14/2007  . HYPERTENSION 11/14/2007  . HYPOTENSION, ORTHOSTATIC 03/06/2011  . Memory loss 06/01/2008  . OSTEOPOROSIS 06/02/2010  . PERIPHERAL VASCULAR DISEASE 11/14/2007  . RENAL INSUFFICIENCY 11/14/2007  . SECONDARY DM W/PERIPHERAL CIRC D/O UNCONTROLLED 11/07/2010  . SYNCOPE 09/08/2008  . Unspecified hearing loss 05/13/2009  . Prostate cancer ([place])   . Myalgia and myositis, unspecified 04/24/2011  . Other malaise and fatigue 04/24/2011  . Peripheral neuropathy ([place]) 03/27/2011  . Seborrheic keratosis 06/05/2011  . Osteoarthrosis, unspecified whether generalized or localized, unspecified site   . Ischemic cardiomyopathy     a. echo (  10/15):  mod focal basal septal hypertrophy, EF 40-45%, inf AK, Gr 1 DD, Al sclerosis, mild AI, MAC, mild MR, mod LAE, lipomatous hypertrophy, small effusion (no hemodynamic compromise)   . Effusion of left knee 03/17/2015  . Severe back pain   . DM (diabetes mellitus),  type 2 with renal complications ([place]) [id number]  . Failure to thrive in adult 05/03/2015  . Chronic combined systolic and diastolic CHF (congestive heart failure) ([place]) 10/10/2014  . CKD stage 3 due to type 1 diabetes mellitus ([place]) 03/17/2015  . Dyspnea 11/09/2014  . Hearing loss 05/13/2009    Qualifier: Diagnosis of  By: Jenny Reichmann MD, Hunt Oris   . Hyperlipemia 11/14/2007    Qualifier: Diagnosis of  By: Jenny Reichmann MD, Hunt Oris    . Generalized weakness 05/20/2015  . Protein-calorie malnutrition, severe ([place]) 05/23/2015  . Vertebral compression fracture ([place]) 09/05/2009    Qualifier: Diagnosis of  By: Jenny Reichmann MD, Hunt Oris   . [name] 04/04/2015  . Weight loss 10/10/2014   Past Surgical History  Procedure Laterality Date  . S/p coronary stent x 1    . Cholecystectomy    . S/p bilat knee replacement  Cleon Gustin, MD  . Pacemaker insertion  2012    Caryl Comes, MD  . Prostate needle biopsy    . Cystoscopy    . Appendectomy    . Coronary artery bypass graft  2007    Allergies  Allergen Reactions  . Hydrocodone     hallucination  . Tramadol     hallucination      Medication List       This list is accurate as of: 02/28/16  2:58 PM.  Always use your most recent med list.               docusate sodium 100 MG capsule  Commonly known as:  COLACE  Take 100 mg by mouth 2 (two) times daily.     feeding supplement (GLUCERNA SHAKE) Liqd  Take 237 mLs by mouth 2 (two) times daily between meals.     feeding supplement (PRO-STAT SUGAR FREE 64) Liqd  Take 30 mLs by mouth 2 (two) times daily with a meal.     fentaNYL 25 MCG/HR patch  Commonly known as:  DURAGESIC  Apply fresh patch every 3 days to help pain. Remove old patch.     ferrous sulfate 325 (65 FE) MG tablet  Take 325 mg by mouth daily with breakfast.     furosemide 20 MG tablet  Commonly known as:  LASIX  Take 20 mg by mouth daily.     nitroGLYCERIN 0.4 MG SL tablet  Commonly known as:  NITROSTAT  Place 0.4 mg under the tongue every 5  (five) minutes as needed for chest pain.     omeprazole 20 MG capsule  Commonly known as:  PRILOSEC  Take 20 mg by mouth daily.     spironolactone 25 MG tablet  Commonly known as:  ALDACTONE  Take 25 mg by mouth daily.     SYSTANE BALANCE OP  Place 1 drop into both eyes daily.        Review of Systems  Constitutional: Negative for fever and chills.       Weight gain #10Ibs in the past month.   HENT: Positive for hearing loss. Negative for ear pain.   Eyes: Negative for photophobia and pain.       Corrective lenses  Respiratory: Positive for shortness of breath. Negative  for cough and wheezing.   Cardiovascular: Positive for leg swelling.       Pacemaker. History of syncope related to cardiac arrhythmia. Right lower leg edema trace  Gastrointestinal: Negative for abdominal pain.  Endocrine: Negative for polydipsia.  Genitourinary: Positive for urgency and frequency. Negative for dysuria and flank pain.       Underwent Urology evaluation in the past, off meds. Episodes of incontinence persist.  Musculoskeletal: Positive for myalgias, back pain and neck pain (Fall on 03/15/15 causing pain in the left knee and effusion.).       Poor balance and gait disturbance. Lower back pain Sever kyphosis  Skin:       Rough area on left cheek R+L buttocks, groins, sacrum excoriated areas are much improved Limited improvement 1/3 lower RLE redness, warmth, swelling, and pain with weight bearing   Neurological: Negative for tremors, seizures and weakness.       Memory loss  Psychiatric/Behavioral:       Mild to moderate memory disturbance.    Immunization History  Administered Date(s) Administered  . Influenza Whole 09/23/2010, 09/23/2012  . Influenza,inj,Quad PF,36+ Mos 10/09/2014  . Influenza-Unspecified 09/07/2013, 09/10/2013, 09/30/2015  . PPD Test 05/23/2015  . Pneumococcal Polysaccharide-23 03/31/2009  . Td 03/31/2009   Pertinent  Health Maintenance Due  Topic Date Due  .  OPHTHALMOLOGY EXAM  06/26/1933  . PNA vac Low Risk Adult (2 of 2 - PCV13) 03/31/2010  . URINE MICROALBUMIN  06/03/2011  . HEMOGLOBIN A1C  12/06/2015  . FOOT EXAM  03/16/2016  . INFLUENZA VACCINE  07/24/2016   Fall Risk  09/26/2015 05/26/2015 04/29/2015 04/04/2015 03/17/2015  Falls in the past year? Yes Yes Yes Yes Yes  Number falls in past yr: 1 1 1 1 2  or more  Injury with Fall? No Yes Yes Yes Yes  Risk Factor Category  - High Fall Risk - High Fall Risk High Fall Risk  Risk for fall due to : Impaired balance/gait History of fall(s);Impaired balance/gait;Impaired mobility - History of fall(s);Impaired balance/gait;Impaired mobility History of fall(s);Impaired balance/gait;Impaired mobility  Follow up Falls evaluation completed Falls evaluation completed - Falls evaluation completed Falls evaluation completed   Functional Status Survey:    Filed Vitals:   02/28/16 0949  BP: 100/60  Pulse: 76  Temp: 97.3 F (36.3 C)  TempSrc: Oral  Resp: 18  Height: 5\' 9"  (1.753 m)  Weight: 167 lb (75.751 kg)   Body mass index is 24.65 kg/(m^2). Physical Exam  Constitutional: He is oriented to person, place, and time.  Frail  HENT:  Head: Normocephalic and atraumatic.  Nose: Nose normal.  Bilateral hearing aids.  Eyes: Conjunctivae are normal. Pupils are equal, round, and reactive to light.  Prescription lenses.  Neck: Normal range of motion. Neck supple. No JVD present. No thyromegaly present.  Cardiovascular: Normal rate and regular rhythm.  Exam reveals no gallop and no friction rub.   Murmur heard.  Systolic murmur is present with a grade of 1/6  Absent DP and PT  Pulmonary/Chest: Effort normal. He has no wheezes. He has rales. He exhibits no tenderness.  Using portable oxygen at 2 L/m today. Moist rales posterior mid to lower lungs.   Abdominal: Soft. Bowel sounds are normal. He exhibits no distension. There is no tenderness.  Musculoskeletal: Normal range of motion. He exhibits edema. He  exhibits no tenderness.  Pain in the left SI joint area. Limps when walking. Favors the left side. Effusion of the left knee. Scars in the left  knee from previous surgery. Tender at the left knee with movement of the patella. Positional lower back pain Server kyphosis RLE edema trace  Neurological: He is alert and oriented to person, place, and time. He has normal strength. A sensory deficit is present. No cranial nerve deficit. Gait abnormal.  07/13/14 MMSE 28/30. Passed clock drawing. Diminshed sensation to vabration and monofilament.  Skin: Skin is warm and dry. No erythema. No pallor.  Rough area on the left cheek. Cystic lesion in the mid forehead. Mid sternal surgical scar. Left upper chest pacemaker. R+L buttocks, groins, sacrum excoriated areas are improved Limited improvement 1/3 lower RLE redness, warmth, swelling, and pain with weight bearing.     Psychiatric: He has a normal mood and affect. His behavior is normal. Thought content normal.  Patient is in denial regarding the extent of his memory deficit.    Labs reviewed:  Recent Labs  05/21/15 0523 05/22/15 0533  12/05/15 1055 12/12/15 01/12/16 02/09/16  NA 136 135  < > 139 139 137 139  K 3.4* 4.4  < > 4.3 4.5 4.6 4.2  CL 104 107  --  104  --   --   --   CO2 25 21*  --  24  --   --   --   GLUCOSE 137* 126*  --  210*  --   --   --   BUN 57* 33*  < > 38* 42* 48* 50*  CREATININE 1.47* 0.92  < > 0.82 1.0 1.1 1.3  CALCIUM 8.8* 8.6*  --  9.2  --   --   --   < > = values in this interval not displayed.  Recent Labs  05/03/15 1040  05/20/15 1341  08/30/15 01/12/16 02/09/16  AST 21  < > 35  < > 17 18 25   ALT 11  < > 19  < > 8* 10 15  ALKPHOS 66  < > 122  < > 190* 104 210*  BILITOT 0.8  --  0.9  --   --   --   --   PROT 7.1  --  6.8  --   --   --   --   ALBUMIN 3.9  --  3.1*  --   --   --   --   < > = values in this interval not displayed.  Recent Labs  05/03/15 1040 05/20/15 1341 05/21/15 0523 05/22/15 0533   01/30/16 02/06/16 02/20/16  WBC 9.6 8.3 6.3 5.4  < > 4.7 5.8 5.9  NEUTROABS 6.8 5.9  --   --   --   --   --   --   HGB  --  13.1 11.5* 11.9*  < > 8.8* 10.1* 11.2*  HCT 42.1 39.0 34.6* 35.3*  < > 30* 33* 37*  MCV 93 90.9 90.1 90.1  --   --   --   --   PLT  --  182 147* 126*  < > 198 192 194  < > = values in this interval not displayed. Lab Results  Component Value Date   TSH 3.16 06/06/2015   Lab Results  Component Value Date   HGBA1C 6.7* 06/06/2015   Lab Results  Component Value Date   CHOL 132 02/07/2015   HDL 33* 02/07/2015   LDLCALC 76 02/07/2015   LDLDIRECT 85.6 07/19/2010   TRIG 115 02/07/2015   CHOLHDL 4 03/06/2011    Significant Diagnostic Results in last 30 days:  No  results found.  Assessment/Plan Backache Continue Fentanyl 49mcg/hr, off Tylenol 1000mg   Chronic combined systolic and diastolic CHF (congestive heart failure) (HCC) Off Furosemide, BNP 1449.1, improved from 01/02/16. Noted increased edema, SOB, moist rales posterior mid to lower lungs, resumed Furosemide 20mg , continue Spironolactone 25mg  daily  CKD stage 3 due to type 1 diabetes mellitus ([place]) 02/09/16 Bun 50, creat 1.27  Constipation Stable, continue 06/10/15 Lactulose prn. Colace bid  Depression Mood is stable, not on a mood stabilizer.   DM type 2 with diabetic peripheral neuropathy 06/06/15 Hgb A1c 6.7 diet control  Essential hypertension Controlled, continue Furosemide 20mg  and Spironolactone 25mg  daily.   GERD Stable, continue Omeprazole 20mg  daily,  Hgb dropped from 11.4 12/12/15 to 7.6 01/02/16, resolved, last Hgb 10s, continue to be off ASA/Elliquis.   Iron deficiency anemia 02/06/16 Hgb 10.1, 02/20/16 Hgb 11.2, continue Fe, continue off ASA/Elliquis.   Memory loss Progressing. off Namenda R>B. SNF for care needs. Comfort measures.   Paroxysmal atrial fibrillation (HCC) Rate is in control w/o medications. off Eliquis 2nd to Hgb 7s.     Family/ staff Communication: continue SNF  for care needs.   Labs/tests ordered: none

## 2016-02-28 NOTE — Progress Notes (Signed)
HPI: FU CAD. Patient had CABG 1997. Abd ultrasound 8/07 showed no aneurysm. Inferior MI 11/07; Severe 3 vessel disease; occluded SVG to RCA; SVG to OM subtotalled, SVG to diagonal with ostial 70 and 80 mid; EF 45. PCI of SVG to OM. FU myovue showed EF 56 with inferior infarct and very mild peri-infarct ischemia. ABI 5/12-left mildly abnormal; normal right. Carotid dopplers 12/14 showed 1-39% bilateral stenosis. Last echocardiogram October 2015 showed an ejection fraction of 40-45%. There is grade 1 diastolic dysfunction. There was mild aortic and mitral regurgitation. There was a small pericardial effusion and moderate left atrial enlargement. Also s/p pacemaker. Previously noted to have atrial fibrillation on his device. Started on apixaban at last ov. Since last seen, Patient denies dyspnea, chest pain, palpitations or syncope. His anticoagulation was discontinued as his hemoglobin dropped. His pedal edema has improved.  Current Outpatient Prescriptions  Medication Sig Dispense Refill  . Amino Acids-Protein Hydrolys (FEEDING SUPPLEMENT, PRO-STAT SUGAR FREE 64,) LIQD Take 30 mLs by mouth 2 (two) times daily with a meal.    . docusate sodium (COLACE) 100 MG capsule Take 100 mg by mouth 2 (two) times daily.    . feeding supplement, GLUCERNA SHAKE, (GLUCERNA SHAKE) LIQD Take 237 mLs by mouth 2 (two) times daily between meals. 20 Can 0  . fentaNYL (DURAGESIC) 25 MCG/HR patch Apply fresh patch every 3 days to help pain. Remove old patch. 10 patch 0  . ferrous sulfate 325 (65 FE) MG tablet Take 325 mg by mouth daily with breakfast.    . furosemide (LASIX) 20 MG tablet Take 20 mg by mouth daily.    . nitroGLYCERIN (NITROSTAT) 0.4 MG SL tablet Place 0.4 mg under the tongue every 5 (five) minutes as needed for chest pain.    Marland Kitchen omeprazole (PRILOSEC) 20 MG capsule Take 20 mg by mouth daily.    Marland Kitchen Propylene Glycol (SYSTANE BALANCE OP) Place 1 drop into both eyes daily.    Marland Kitchen spironolactone (ALDACTONE) 25  MG tablet Take 25 mg by mouth daily.     No current facility-administered medications for this visit.     Past Medical History  Diagnosis Date  . ANEMIA-IRON DEFICIENCY 05/27/2007  . ANXIETY 11/14/2007  . Intermittent high-grade heart block 09/05/2009    s/p pacemaker  . BENIGN PROSTATIC HYPERTROPHY 11/14/2007  . CAROTID ARTERY DISEASE 10/02/2010  . Pacemaker-St. Jude 02/02/2008    St. Jude  . COLONIC POLYPS, HX OF 05/27/2007  . CORONARY ARTERY DISEASE 11/14/2007    prior bypass [phone]; Inferior MI 11/07; Severe 3 vessel disease; occluded SVG to RCA; SVG to OM subtotalled, SVG to diagonal with ostial 70 and 80 mid; EF 45. PCI of SVG to OM. FU myovue showed EF 56 with inferior infarct and very mild peri-infarct ischemia  . DEPRESSION 11/14/2007  . DIABETES MELLITUS, TYPE II 11/14/2007  . DIVERTICULITIS, HX OF 05/27/2007  . DVT (deep venous thrombosis) ([place]) 03/21/2011  . FOOT PAIN, BILATERAL 12/08/2010  . GERD 11/14/2007  . HYPERLIPIDEMIA 11/14/2007  . HYPERTENSION 11/14/2007  . HYPOTENSION, ORTHOSTATIC 03/06/2011  . Memory loss 06/01/2008  . OSTEOPOROSIS 06/02/2010  . PERIPHERAL VASCULAR DISEASE 11/14/2007  . RENAL INSUFFICIENCY 11/14/2007  . SECONDARY DM W/PERIPHERAL CIRC D/O UNCONTROLLED 11/07/2010  . SYNCOPE 09/08/2008  . Unspecified hearing loss 05/13/2009  . Prostate cancer ([place])   . Myalgia and myositis, unspecified 04/24/2011  . Other malaise and fatigue 04/24/2011  . Peripheral neuropathy ([place]) 03/27/2011  . Seborrheic keratosis 06/05/2011  .  Osteoarthrosis, unspecified whether generalized or localized, unspecified site   . Ischemic cardiomyopathy     a. echo (10/15):  mod focal basal septal hypertrophy, EF 40-45%, inf AK, Gr 1 DD, Al sclerosis, mild AI, MAC, mild MR, mod LAE, lipomatous hypertrophy, small effusion (no hemodynamic compromise)   . Effusion of left knee 03/17/2015  . Severe back pain   . DM (diabetes mellitus), type 2 with renal complications ([place]) [id number]  .  Failure to thrive in adult 05/03/2015  . Chronic combined systolic and diastolic CHF (congestive heart failure) ([place]) 10/10/2014  . CKD stage 3 due to type 1 diabetes mellitus ([place]) 03/17/2015  . Dyspnea 11/09/2014  . Hearing loss 05/13/2009    Qualifier: Diagnosis of  By: Jenny Reichmann MD, Hunt Oris   . Hyperlipemia 11/14/2007    Qualifier: Diagnosis of  By: Jenny Reichmann MD, Hunt Oris    . Generalized weakness 05/20/2015  . Protein-calorie malnutrition, severe ([place]) 05/23/2015  . Vertebral compression fracture ([place]) 09/05/2009    Qualifier: Diagnosis of  By: Jenny Reichmann MD, Hunt Oris   . [name] 04/04/2015  . Weight loss 10/10/2014    Past Surgical History  Procedure Laterality Date  . S/p coronary stent x 1    . Cholecystectomy    . S/p bilat knee replacement  Cleon Gustin, MD  . Pacemaker insertion  2012    Caryl Comes, MD  . Prostate needle biopsy    . Cystoscopy    . Appendectomy    . Coronary artery bypass graft  2007    Social History   Social History  . Marital Status: Widowed    Spouse Name: N/A  . Number of Children: 2  . Years of Education: College   Occupational History  . retired operated [employer]  . Smoking status: Former Smoker -- 1.00 packs/day for 50 years    Types: Cigarettes    Quit date: 10/18/1983  . Smokeless tobacco: Never Used  . Alcohol Use: No  . Drug Use: No  . Sexual Activity: No   Other Topics Concern  . Not on file   Social History Narrative   Patient is widowed with 2 children.   Patient has a college education.   Patient is right handed.   Patient drinks 2 cups daily.   Walking with wallker    Family History  Problem Relation Age of Onset  . Diabetes Father     31  . Cancer Mother     ROS: no fevers or chills, productive cough, hemoptysis, dysphasia, odynophagia, melena, hematochezia, dysuria, hematuria, rash, seizure activity, orthopnea, PND, pedal edema, claudication. Remaining systems are negative.  Physical  Exam: Well-developed frail in no acute distress.  Skin is warm and dry.  HEENT is normal.  Neck is supple.  Chest is clear to auscultation with normal expansion.  Cardiovascular exam is regular rate and rhythm. 3/6 systolic murmur apex Abdominal exam nontender or distended. No masses palpated. Extremities show trace edema. neuro grossly intact

## 2016-03-01 ENCOUNTER — Other Ambulatory Visit: Payer: Self-pay

## 2016-03-01 NOTE — Patient Outreach (Signed)
[facility]) Care Management  03/01/2016  Zachary Harris [birth year] [id number]  Telephone call to patient for united health care screening referral. Looks as if patient is in SNF. Left HIPAA compliant voice message on son's phone number to confirm patient status.   Plan: RN Health Coach will attempt again in 1-2 weeks.    Jone Baseman, RN, MSN [place] [phone]

## 2016-03-02 LAB — CUP PACEART REMOTE DEVICE CHECK
Brady Statistic AP VP Percent: 1 %
Brady Statistic AP VS Percent: 1.9 %
Brady Statistic RV Percent Paced: 18 %
Date Time Interrogation Session: 20170206070018
Implantable Lead Implant Date: 20120326
Lead Channel Impedance Value: 430 Ohm
Lead Channel Pacing Threshold Amplitude: 0.75 V
Lead Channel Sensing Intrinsic Amplitude: 7.5 mV
Lead Channel Setting Pacing Pulse Width: 0.4 ms
Lead Channel Setting Sensing Sensitivity: 2 mV
MDC IDC LEAD IMPLANT DT: 20120326
MDC IDC LEAD LOCATION: 753859
MDC IDC LEAD LOCATION: 753860
MDC IDC MSMT BATTERY REMAINING LONGEVITY: 103 mo
MDC IDC MSMT BATTERY REMAINING PERCENTAGE: 81 %
MDC IDC MSMT BATTERY VOLTAGE: 2.93 V
MDC IDC MSMT LEADCHNL RA IMPEDANCE VALUE: 350 Ohm
MDC IDC MSMT LEADCHNL RA PACING THRESHOLD AMPLITUDE: 0.75 V
MDC IDC MSMT LEADCHNL RA PACING THRESHOLD PULSEWIDTH: 0.4 ms
MDC IDC MSMT LEADCHNL RA SENSING INTR AMPL: 2.1 mV
MDC IDC MSMT LEADCHNL RV PACING THRESHOLD PULSEWIDTH: 0.4 ms
MDC IDC SET LEADCHNL RA PACING AMPLITUDE: 2 V
MDC IDC SET LEADCHNL RV PACING AMPLITUDE: 2.5 V
MDC IDC STAT BRADY AS VP PERCENT: 17 %
MDC IDC STAT BRADY AS VS PERCENT: 80 %
MDC IDC STAT BRADY RA PERCENT PACED: 1.6 %
Pulse Gen Model: 2210
Pulse Gen Serial Number: 7228315

## 2016-03-06 ENCOUNTER — Ambulatory Visit (INDEPENDENT_AMBULATORY_CARE_PROVIDER_SITE_OTHER): Payer: Medicare Other | Admitting: Cardiology

## 2016-03-06 ENCOUNTER — Encounter: Payer: Self-pay | Admitting: Cardiology

## 2016-03-06 VITALS — BP 124/62 | HR 84 | Ht 70.0 in | Wt 163.0 lb

## 2016-03-06 DIAGNOSIS — I25810 Atherosclerosis of coronary artery bypass graft(s) without angina pectoris: Secondary | ICD-10-CM

## 2016-03-06 DIAGNOSIS — I10 Essential (primary) hypertension: Secondary | ICD-10-CM

## 2016-03-06 DIAGNOSIS — I2583 Coronary atherosclerosis due to lipid rich plaque: Secondary | ICD-10-CM

## 2016-03-06 DIAGNOSIS — Z950 Presence of cardiac pacemaker: Secondary | ICD-10-CM

## 2016-03-06 DIAGNOSIS — I480 Paroxysmal atrial fibrillation: Secondary | ICD-10-CM

## 2016-03-06 DIAGNOSIS — I5042 Chronic combined systolic (congestive) and diastolic (congestive) heart failure: Secondary | ICD-10-CM

## 2016-03-06 DIAGNOSIS — I2510 Atherosclerotic heart disease of native coronary artery without angina pectoris: Secondary | ICD-10-CM | POA: Diagnosis not present

## 2016-03-06 LAB — BASIC METABOLIC PANEL
BUN: 45 mg/dL — ABNORMAL HIGH (ref 7–25)
CHLORIDE: 101 mmol/L (ref 98–110)
CO2: 29 mmol/L (ref 20–31)
Calcium: 9.6 mg/dL (ref 8.6–10.3)
Creat: 1.15 mg/dL — ABNORMAL HIGH (ref 0.70–1.11)
Glucose, Bld: 98 mg/dL (ref 65–99)
Potassium: 4.4 mmol/L (ref 3.5–5.3)
SODIUM: 140 mmol/L (ref 135–146)

## 2016-03-06 NOTE — Assessment & Plan Note (Signed)
Blood pressure controlled. Continue present medications. 

## 2016-03-06 NOTE — Patient Instructions (Signed)
Medication Instructions:   NO CHANGE  Labwork:  Your physician recommends that you HAVE LAB WORK TODAY  Follow-Up:  Your physician wants you to follow-up in: 6 MONTHS WITH DR CRENSHAW You will receive a reminder letter in the mail two months in advance. If you don't receive a letter, please call our office to schedule the follow-up appointment.      

## 2016-03-06 NOTE — Assessment & Plan Note (Signed)
Plan conservative measures only. Patient requests no further testing which is appropriate.

## 2016-03-06 NOTE — Assessment & Plan Note (Signed)
Patient's volume status is much better.However he does have renal insufficiency. Continue present dose of diuretics. Check potassium and renal function and adjust regimen as needed.

## 2016-03-06 NOTE — Assessment & Plan Note (Signed)
Followed by electrophysiology. 

## 2016-03-06 NOTE — Assessment & Plan Note (Signed)
Patient developed significant anemia after initiation of anticoagulation. This has been discontinued and we will not rechallenge. They understand the higher risk of stroke. We will  Concentrate on comfort measures.

## 2016-03-07 ENCOUNTER — Encounter: Payer: Self-pay | Admitting: Cardiology

## 2016-03-07 ENCOUNTER — Other Ambulatory Visit: Payer: Self-pay

## 2016-03-07 NOTE — Patient Outreach (Signed)
[facility] Care Management  03/07/2016  Zachary Harris 06-21-1923 [id number]   Telephone call to patient referral screening.  Spoke with son Zachary Harris who states patient is in [facility] facility and will be there for the duration.    Plan: RN Health Coach will send message to care management assistant for closure. RN Health Coach will send letter and brochure to patient. RN Health Coach will send physician notification of closure.  Jone Baseman, RN, MSN [place] [phone]

## 2016-03-22 DIAGNOSIS — N179 Acute kidney failure, unspecified: Secondary | ICD-10-CM | POA: Diagnosis not present

## 2016-03-22 LAB — CBC AND DIFFERENTIAL
HEMATOCRIT: 40 % — AB (ref 41–53)
Hemoglobin: 12.9 g/dL — AB (ref 13.5–17.5)
Platelets: 143 10*3/uL — AB (ref 150–399)
WBC: 5.4 10^3/mL

## 2016-03-27 ENCOUNTER — Non-Acute Institutional Stay (SKILLED_NURSING_FACILITY): Payer: Medicare Other | Admitting: Nurse Practitioner

## 2016-03-27 ENCOUNTER — Encounter: Payer: Self-pay | Admitting: Nurse Practitioner

## 2016-03-27 DIAGNOSIS — R413 Other amnesia: Secondary | ICD-10-CM | POA: Diagnosis not present

## 2016-03-27 DIAGNOSIS — M5440 Lumbago with sciatica, unspecified side: Secondary | ICD-10-CM

## 2016-03-27 DIAGNOSIS — D509 Iron deficiency anemia, unspecified: Secondary | ICD-10-CM

## 2016-03-27 DIAGNOSIS — I10 Essential (primary) hypertension: Secondary | ICD-10-CM | POA: Diagnosis not present

## 2016-03-27 DIAGNOSIS — I480 Paroxysmal atrial fibrillation: Secondary | ICD-10-CM

## 2016-03-27 DIAGNOSIS — I5042 Chronic combined systolic (congestive) and diastolic (congestive) heart failure: Secondary | ICD-10-CM

## 2016-03-27 DIAGNOSIS — K219 Gastro-esophageal reflux disease without esophagitis: Secondary | ICD-10-CM

## 2016-03-27 DIAGNOSIS — E1022 Type 1 diabetes mellitus with diabetic chronic kidney disease: Secondary | ICD-10-CM | POA: Diagnosis not present

## 2016-03-27 DIAGNOSIS — F329 Major depressive disorder, single episode, unspecified: Secondary | ICD-10-CM

## 2016-03-27 DIAGNOSIS — K5900 Constipation, unspecified: Secondary | ICD-10-CM

## 2016-03-27 DIAGNOSIS — E1142 Type 2 diabetes mellitus with diabetic polyneuropathy: Secondary | ICD-10-CM | POA: Diagnosis not present

## 2016-03-27 DIAGNOSIS — F32A Depression, unspecified: Secondary | ICD-10-CM

## 2016-03-27 DIAGNOSIS — I2510 Atherosclerotic heart disease of native coronary artery without angina pectoris: Secondary | ICD-10-CM

## 2016-03-27 DIAGNOSIS — N183 Chronic kidney disease, stage 3 (moderate): Secondary | ICD-10-CM

## 2016-03-27 DIAGNOSIS — I2583 Coronary atherosclerosis due to lipid rich plaque: Secondary | ICD-10-CM

## 2016-03-27 NOTE — Assessment & Plan Note (Signed)
Off Furosemide, BNP 1449.1, improved from 01/02/16. Noted increased edema, SOB, moist rales posterior mid to lower lungs, resumed Furosemide 20mg , continue Spironolactone 25mg  daily Compensated clinically, continue Furosemide 20mg , Spironolactone 25mg  daily. 02/09/16 BNP 1959.3. Update CMP and BNP

## 2016-03-27 NOTE — Assessment & Plan Note (Signed)
Mood is stable, not on a mood stabilizer.  

## 2016-03-27 NOTE — Progress Notes (Signed)
Patient ID: Zachary Harris, male   DOB: 1923-04-22, 82 y.o.   MRN: [id number]  Location:  [place] Room Number: 9 Place of Service:  SNF (31) Provider: Lennie Odor Danielle Mink NP  GREEN, Viviann Spare, MD  Patient Care Team: Estill Dooms, MD as PCP - General (Internal Medicine) Irine Seal, MD as Consulting Physician (Urology) Renella Cunas, MD as Consulting Physician (Cardiology) [facility] Lelon Perla, MD as Consulting Physician (Cardiology) Meagon Duskin X, NP as Nurse Practitioner (Nurse Practitioner) Rigoberto Noel, MD as Consulting Physician (Pulmonary Disease)  Extended Emergency Contact Information Primary Emergency Contact: Kathline Magic, Rosezella Rumpf of [place] Phone: [phone] Relation: Daughter Secondary Emergency Contact: Johnnette Gourd [place] of [place] Phone: [phone] Mobile Phone: [phone] Relation: Son  Code Status:  DNR Goals of care: Advanced Directive information Advanced Directives 03/27/2016  Does patient have an advance directive? Yes  Type of Paramedic of [place];Out of facility DNR (pink MOST or yellow form)  Does patient want to make changes to advanced directive? -  Copy of advanced directive(s) in chart? Yes     Chief Complaint  Patient presents with  . Medical Management of Chronic Issues    routine Visit    HPI:  Pt is a 82 y.o. male seen today for medical management of chronic diseases.  Hx of Afib, heart rate is in control, dementia, depression, stable,  HTN, controlled while on furosemide 20mg  and Spironolactone 25mg , anemia, Hgb 7.6, dropped from 11.4 12/22/15, repeated Hgb 7.3 01/09/16, no apparent bleeding noted, Eliquis/ASA held, fluid retention may contributory to the low Hgbs., improved to 10s after the patient is gently diuresed and ASA/Elliquis dc'd, on Fe 325mg  daily. CHF BNP 1449.1 01/02/16, the patient is apparently has increased edema, moist  rales posterior mid to lower lungs, SOB easily with exertion, no significant change since last seen, on Furosemide 20mg  and Spironolactone 25mg  daily.    Past Medical History  Diagnosis Date  . ANEMIA-IRON DEFICIENCY 05/27/2007  . ANXIETY 11/14/2007  . Intermittent high-grade heart block 09/05/2009    s/p pacemaker  . BENIGN PROSTATIC HYPERTROPHY 11/14/2007  . CAROTID ARTERY DISEASE 10/02/2010  . Pacemaker-St. Jude 02/02/2008    St. Jude  . COLONIC POLYPS, HX OF 05/27/2007  . CORONARY ARTERY DISEASE 11/14/2007    prior bypass [phone]; Inferior MI 11/07; Severe 3 vessel disease; occluded SVG to RCA; SVG to OM subtotalled, SVG to diagonal with ostial 70 and 80 mid; EF 45. PCI of SVG to OM. FU myovue showed EF 56 with inferior infarct and very mild peri-infarct ischemia  . DEPRESSION 11/14/2007  . DIABETES MELLITUS, TYPE II 11/14/2007  . DIVERTICULITIS, HX OF 05/27/2007  . DVT (deep venous thrombosis) ([place]) 03/21/2011  . FOOT PAIN, BILATERAL 12/08/2010  . GERD 11/14/2007  . HYPERLIPIDEMIA 11/14/2007  . HYPERTENSION 11/14/2007  . HYPOTENSION, ORTHOSTATIC 03/06/2011  . Memory loss 06/01/2008  . OSTEOPOROSIS 06/02/2010  . PERIPHERAL VASCULAR DISEASE 11/14/2007  . RENAL INSUFFICIENCY 11/14/2007  . SECONDARY DM W/PERIPHERAL CIRC D/O UNCONTROLLED 11/07/2010  . SYNCOPE 09/08/2008  . Unspecified hearing loss 05/13/2009  . Prostate cancer ([place])   . Myalgia and myositis, unspecified 04/24/2011  . Other malaise and fatigue 04/24/2011  . Peripheral neuropathy ([place]) 03/27/2011  . Seborrheic keratosis 06/05/2011  . Osteoarthrosis, unspecified whether generalized or localized, unspecified site   . Ischemic cardiomyopathy     a. echo (10/15):  mod focal basal septal hypertrophy, EF 40-45%, inf AK, Gr 1 DD, Al sclerosis, mild AI, MAC, mild MR, mod LAE, lipomatous hypertrophy, small effusion (no hemodynamic compromise)   . Effusion of left knee 03/17/2015  . Severe back pain   . DM (diabetes mellitus), type 2 with  renal complications ([place]) [id number]  . Failure to thrive in adult 05/03/2015  . Chronic combined systolic and diastolic CHF (congestive heart failure) ([place]) 10/10/2014  . CKD stage 3 due to type 1 diabetes mellitus ([place]) 03/17/2015  . Dyspnea 11/09/2014  . Hearing loss 05/13/2009    Qualifier: Diagnosis of  By: Jenny Reichmann MD, Hunt Oris   . Hyperlipemia 11/14/2007    Qualifier: Diagnosis of  By: Jenny Reichmann MD, Hunt Oris    . Generalized weakness 05/20/2015  . Protein-calorie malnutrition, severe ([place]) 05/23/2015  . Vertebral compression fracture ([place]) 09/05/2009    Qualifier: Diagnosis of  By: Jenny Reichmann MD, Hunt Oris   . [name] 04/04/2015  . Weight loss 10/10/2014   Past Surgical History  Procedure Laterality Date  . S/p coronary stent x 1    . Cholecystectomy    . S/p bilat knee replacement  Cleon Gustin, MD  . Pacemaker insertion  2012    Caryl Comes, MD  . Prostate needle biopsy    . Cystoscopy    . Appendectomy    . Coronary artery bypass graft  2007    Allergies  Allergen Reactions  . Hydrocodone     hallucination  . Tramadol     hallucination      Medication List       This list is accurate as of: 03/27/16  3:04 PM.  Always use your most recent med list.               BOOST DIABETIC Liqd  Take 237 mLs by mouth 3 (three) times daily between meals.     docusate sodium 100 MG capsule  Commonly known as:  COLACE  Take 100 mg by mouth 2 (two) times daily.     feeding supplement (PRO-STAT SUGAR FREE 64) Liqd  Take 30 mLs by mouth 2 (two) times daily with a meal.     fentaNYL 25 MCG/HR patch  Commonly known as:  DURAGESIC  Apply fresh patch every 3 days to help pain. Remove old patch.     ferrous sulfate 325 (65 FE) MG tablet  Take 325 mg by mouth daily with breakfast.     furosemide 20 MG tablet  Commonly known as:  LASIX  Take 20 mg by mouth daily.     nitroGLYCERIN 0.4 MG SL tablet  Commonly known as:  NITROSTAT  Place 0.4 mg under the tongue every 5 (five) minutes as needed for chest  pain.     omeprazole 20 MG capsule  Commonly known as:  PRILOSEC  Take 20 mg by mouth daily.     spironolactone 25 MG tablet  Commonly known as:  ALDACTONE  Take 25 mg by mouth daily.     SYSTANE BALANCE OP  Place 1 drop into both eyes daily.        Review of Systems  Constitutional: Negative for fever and chills.  HENT: Positive for hearing loss. Negative for ear pain.   Eyes: Negative for photophobia and pain.       Corrective lenses  Respiratory: Positive for shortness of breath. Negative for cough and wheezing.        O2 dependent  Cardiovascular: Positive for leg  swelling.       Pacemaker. History of syncope related to cardiac arrhythmia. BLE edema trace R>L  Gastrointestinal: Negative for abdominal pain.  Endocrine: Negative for polydipsia.  Genitourinary: Positive for urgency and frequency. Negative for dysuria and flank pain.       Underwent Urology evaluation in the past, off meds. Episodes of incontinence persist.  Musculoskeletal: Positive for myalgias, back pain, gait problem and neck pain (Fall on 03/15/15 causing pain in the left knee and effusion.).       Poor balance and gait disturbance. Lower back pain Sever kyphosis  Skin:       Rough area on left cheek   Neurological: Negative for tremors, seizures and weakness.       Memory loss  Psychiatric/Behavioral:       Mild to moderate memory disturbance.    Immunization History  Administered Date(s) Administered  . Influenza Whole 09/23/2010, 09/23/2012  . Influenza,inj,Quad PF,36+ Mos 10/09/2014  . Influenza-Unspecified 09/07/2013, 09/10/2013, 09/30/2015  . PPD Test 05/23/2015  . Pneumococcal Polysaccharide-23 03/31/2009  . Td 03/31/2009   Pertinent  Health Maintenance Due  Topic Date Due  . OPHTHALMOLOGY EXAM  06/26/1933  . PNA vac Low Risk Adult (2 of 2 - PCV13) 03/31/2010  . URINE MICROALBUMIN  06/03/2011  . HEMOGLOBIN A1C  12/06/2015  . FOOT EXAM  03/16/2016  . INFLUENZA VACCINE  07/24/2016     Fall Risk  09/26/2015 05/26/2015 04/29/2015 04/04/2015 03/17/2015  Falls in the past year? Yes Yes Yes Yes Yes  Number falls in past yr: 1 1 1 1 2  or more  Injury with Fall? No Yes Yes Yes Yes  Risk Factor Category  - High Fall Risk - High Fall Risk High Fall Risk  Risk for fall due to : Impaired balance/gait History of fall(s);Impaired balance/gait;Impaired mobility - History of fall(s);Impaired balance/gait;Impaired mobility History of fall(s);Impaired balance/gait;Impaired mobility  Follow up Falls evaluation completed Falls evaluation completed - Falls evaluation completed Falls evaluation completed   Functional Status Survey:    Filed Vitals:   03/27/16 1116  BP: 110/60  Pulse: 84  Temp: 98.3 F (36.8 C)  TempSrc: Oral  Resp: 20  Height: 5\' 10"  (1.778 m)  Weight: 163 lb (73.936 kg)  SpO2: 95%   Body mass index is 23.39 kg/(m^2). Physical Exam  Constitutional: He is oriented to person, place, and time.  Frail  HENT:  Head: Normocephalic and atraumatic.  Nose: Nose normal.  Bilateral hearing aids.  Eyes: Conjunctivae are normal. Pupils are equal, round, and reactive to light.  Prescription lenses.  Neck: Normal range of motion. Neck supple. No JVD present. No thyromegaly present.  Cardiovascular: Normal rate and regular rhythm.  Exam reveals no gallop and no friction rub.   Murmur heard.  Systolic murmur is present with a grade of 1/6  Absent DP and PT  Pulmonary/Chest: Effort normal. He has no wheezes. He has rales. He exhibits no tenderness.  Using portable oxygen at 2 L/m today. Moist rales posterior mid to lower lungs.   Abdominal: Soft. Bowel sounds are normal. He exhibits no distension. There is no tenderness.  Musculoskeletal: Normal range of motion. He exhibits edema. He exhibits no tenderness.  Pain in the left SI joint area. Limps when walking. Favors the left side. Effusion of the left knee. Scars in the left knee from previous surgery. Tender at the left knee  with movement of the patella. Positional lower back pain Server kyphosis RLE edema trace  Neurological: He is  alert and oriented to person, place, and time. He has normal strength. A sensory deficit is present. No cranial nerve deficit. Gait abnormal.  07/13/14 MMSE 28/30. Passed clock drawing. Diminshed sensation to vabration and monofilament.  Skin: Skin is warm and dry. No erythema. No pallor.  Rough area on the left cheek. Cystic lesion in the mid forehead. Mid sternal surgical scar. Left upper chest pacemaker.     Psychiatric: He has a normal mood and affect. His behavior is normal. Thought content normal.  Patient is in denial regarding the extent of his memory deficit.    Labs reviewed:  Recent Labs  05/22/15 0533  12/05/15 1055  01/12/16 02/09/16 03/06/16 1448  NA 135  < > 139  < > 137 139 140  K 4.4  < > 4.3  < > 4.6 4.2 4.4  CL 107  --  104  --   --   --  101  CO2 21*  --  24  --   --   --  29  GLUCOSE 126*  --  210*  --   --   --  98  BUN 33*  < > 38*  < > 48* 50* 45*  CREATININE 0.92  < > 0.82  < > 1.1 1.3 1.15*  CALCIUM 8.6*  --  9.2  --   --   --  9.6  < > = values in this interval not displayed.  Recent Labs  05/03/15 1040  05/20/15 1341  08/30/15 01/12/16 02/09/16  AST 21  < > 35  < > 17 18 25   ALT 11  < > 19  < > 8* 10 15  ALKPHOS 66  < > 122  < > 190* 104 210*  BILITOT 0.8  --  0.9  --   --   --   --   PROT 7.1  --  6.8  --   --   --   --   ALBUMIN 3.9  --  3.1*  --   --   --   --   < > = values in this interval not displayed.  Recent Labs  05/03/15 1040  05/20/15 1341 05/21/15 0523 05/22/15 0533  02/06/16 02/20/16 03/22/16  WBC 9.6  < > 8.3 6.3 5.4  < > 5.8 5.9 5.4  NEUTROABS 6.8  --  5.9  --   --   --   --   --   --   HGB  --   < > 13.1 11.5* 11.9*  < > 10.1* 11.2* 12.9*  HCT 42.1  < > 39.0 34.6* 35.3*  < > 33* 37* 40*  MCV 93  --  90.9 90.1 90.1  --   --   --   --   PLT  --   < > 182 147* 126*  < > 192 194 143*  < > = values in this  interval not displayed. Lab Results  Component Value Date   TSH 3.16 06/06/2015   Lab Results  Component Value Date   HGBA1C 6.7* 06/06/2015   Lab Results  Component Value Date   CHOL 132 02/07/2015   HDL 33* 02/07/2015   LDLCALC 76 02/07/2015   LDLDIRECT 85.6 07/19/2010   TRIG 115 02/07/2015   CHOLHDL 4 03/06/2011    Significant Diagnostic Results in last 30 days:  No results found.  Assessment/Plan Backache Continue Fentanyl 45mcg/hr, off Tylenol 1000mg   CAD (coronary artery disease) No angina since admitted to  SNF. Prn NTG, statin, and off Xarelto  Chronic combined systolic and diastolic CHF (congestive heart failure) (HCC) Off Furosemide, BNP 1449.1, improved from 01/02/16. Noted increased edema, SOB, moist rales posterior mid to lower lungs, resumed Furosemide 20mg , continue Spironolactone 25mg  daily Compensated clinically, continue Furosemide 20mg , Spironolactone 25mg  daily. 02/09/16 BNP 1959.3. Update CMP and BNP   CKD stage 3 due to type 1 diabetes mellitus ([place]) 02/09/16 Bun 50, creat 1.27, update CMP  Constipation Stable, continue 06/10/15 Lactulose prn. Colace bid  Depression Mood is stable, not on a mood stabilizer.   DM type 2 with diabetic peripheral neuropathy 06/06/15 Hgb A1c 6.7 diet control  Essential hypertension Controlled, continue Furosemide 20mg  and Spironolactone 25mg  daily.   GERD Stable, continue Omeprazole 20mg  daily,  Hgb dropped from 11.4 12/12/15 to 7.6 01/02/16, resolved, last Hgb 10s, continue to be off ASA/Elliquis.    Iron deficiency anemia 02/06/16 Hgb 10.1, 02/20/16 Hgb 11.2, 03/22/16 Hgb 12.9, dc continue Fe, continue off ASA/Elliquis. Update CBC in one month  Memory loss Progressing. off Namenda R>B. SNF for care needs. Comfort measures.   Paroxysmal atrial fibrillation (HCC) Rate is in control w/o medications. off Eliquis 2nd to Hgb 7s.     Family/ staff Communication: continue SNF for care needs.   Labs/tests ordered: CBC,  CMP, BNP

## 2016-03-27 NOTE — Assessment & Plan Note (Signed)
No angina since admitted to SNF. Prn NTG, statin, and off Xarelto 

## 2016-03-27 NOTE — Assessment & Plan Note (Signed)
Continue Fentanyl 25mcg/hr, off Tylenol 1000mg 

## 2016-03-27 NOTE — Assessment & Plan Note (Signed)
Stable, continue Omeprazole 20mg  daily,  Hgb dropped from 11.4 12/12/15 to 7.6 01/02/16, resolved, last Hgb 10s, continue to be off ASA/Elliquis.

## 2016-03-27 NOTE — Assessment & Plan Note (Signed)
02/09/16 Bun 50, creat 1.27, update CMP

## 2016-03-27 NOTE — Assessment & Plan Note (Signed)
Rate is in control w/o medications. off Eliquis 2nd to Hgb 7s.  

## 2016-03-27 NOTE — Assessment & Plan Note (Signed)
Stable, continue 06/10/15 Lactulose prn. Colace bid.  

## 2016-03-27 NOTE — Assessment & Plan Note (Signed)
Controlled, continue Furosemide 20mg  and Spironolactone 25mg  daily.

## 2016-03-27 NOTE — Assessment & Plan Note (Signed)
Progressing. off Namenda R>B. SNF for care needs. Comfort measures.   

## 2016-03-27 NOTE — Assessment & Plan Note (Signed)
06/06/15 Hgb A1c 6.7 diet control.   

## 2016-03-27 NOTE — Assessment & Plan Note (Signed)
02/06/16 Hgb 10.1, 02/20/16 Hgb 11.2, 03/22/16 Hgb 12.9, dc continue Fe, continue off ASA/Elliquis. Update CBC in one month

## 2016-03-29 ENCOUNTER — Encounter: Payer: Self-pay | Admitting: Internal Medicine

## 2016-03-29 ENCOUNTER — Non-Acute Institutional Stay (SKILLED_NURSING_FACILITY): Payer: Medicare Other | Admitting: Internal Medicine

## 2016-03-29 DIAGNOSIS — E1122 Type 2 diabetes mellitus with diabetic chronic kidney disease: Secondary | ICD-10-CM

## 2016-03-29 DIAGNOSIS — R413 Other amnesia: Secondary | ICD-10-CM | POA: Diagnosis not present

## 2016-03-29 DIAGNOSIS — I5020 Unspecified systolic (congestive) heart failure: Secondary | ICD-10-CM | POA: Diagnosis not present

## 2016-03-29 DIAGNOSIS — N179 Acute kidney failure, unspecified: Secondary | ICD-10-CM | POA: Diagnosis not present

## 2016-03-29 DIAGNOSIS — R627 Adult failure to thrive: Secondary | ICD-10-CM

## 2016-03-29 DIAGNOSIS — I10 Essential (primary) hypertension: Secondary | ICD-10-CM

## 2016-03-29 DIAGNOSIS — D509 Iron deficiency anemia, unspecified: Secondary | ICD-10-CM | POA: Diagnosis not present

## 2016-03-29 DIAGNOSIS — I5042 Chronic combined systolic (congestive) and diastolic (congestive) heart failure: Secondary | ICD-10-CM | POA: Diagnosis not present

## 2016-03-29 DIAGNOSIS — I255 Ischemic cardiomyopathy: Secondary | ICD-10-CM | POA: Diagnosis not present

## 2016-03-29 DIAGNOSIS — N183 Chronic kidney disease, stage 3 (moderate): Secondary | ICD-10-CM

## 2016-03-29 LAB — BASIC METABOLIC PANEL
BUN: 43 mg/dL — AB (ref 4–21)
CREATININE: 1.1 mg/dL (ref 0.6–1.3)
Glucose: 133 mg/dL
POTASSIUM: 4.5 mmol/L (ref 3.4–5.3)
Sodium: 137 mmol/L (ref 137–147)

## 2016-03-29 LAB — HEPATIC FUNCTION PANEL
ALK PHOS: 131 U/L — AB (ref 25–125)
ALT: 12 U/L (ref 10–40)
AST: 19 U/L (ref 14–40)
Bilirubin, Total: 0.8 mg/dL

## 2016-03-29 NOTE — Progress Notes (Signed)
Patient ID: SARIM PROPER, male   DOB: 03-07-1923, 82 y.o.   MRN: [id number]  Location:  [place] [place] Room Number: N9 Place of Service:  SNF (31) Provider:  Estill Dooms, MD  Patient Care Team: Estill Dooms, MD as PCP - General (Internal Medicine) Irine Seal, MD as Consulting Physician (Urology) Renella Cunas, MD as Consulting Physician (Cardiology) [facility] Lelon Perla, MD as Consulting Physician (Cardiology) [facility] Rhunette Croft, NP as Nurse Practitioner (Nurse Practitioner) Rigoberto Noel, MD as Consulting Physician (Pulmonary Disease)  Extended Emergency Contact Information Primary Emergency Contact: Kathline Magic, Rosezella Rumpf of [place] Phone: [phone] Relation: Daughter Secondary Emergency Contact: Johnnette Gourd [place] of [place] Phone: [phone] Mobile Phone: [phone] Relation: Son  Code Status:  DNR Goals of care: Advanced Directive information Advanced Directives 03/29/2016  Does patient have an advance directive? Yes  Type of Paramedic of [place];Out of facility DNR (pink MOST or yellow form)  Copy of advanced directive(s) in chart? Yes  Pre-existing out of facility DNR order (yellow form or pink MOST form) Yellow form placed in chart (order not valid for inpatient use);Pink MOST form placed in chart (order not valid for inpatient use)     Chief Complaint  Patient presents with  . Medical Management of Chronic Issues    routine    HPI:  Pt is a 82 y.o. male seen today for medical management of chronic diseases.    Memory loss - Continued evidence for confusion. Patient requires direction.  Type 2 diabetes mellitus with stage 3 chronic kidney disease, without long-term current use of insulin (HCC) - stable  Chronic combined systolic and diastolic CHF (congestive heart failure) ([place]) - compensated  Essential hypertension - controlled  Failure to thrive  in adult - patient has appeared to stabilize. He still does not eat well particularly at lunch. Weight is about the same as it was a few months ago.  Iron deficiency anemia -     Past Medical History  Diagnosis Date  . ANEMIA-IRON DEFICIENCY 05/27/2007  . ANXIETY 11/14/2007  . Intermittent high-grade heart block 09/05/2009    s/p pacemaker  . BENIGN PROSTATIC HYPERTROPHY 11/14/2007  . CAROTID ARTERY DISEASE 10/02/2010  . Pacemaker-St. Jude 02/02/2008    St. Jude  . COLONIC POLYPS, HX OF 05/27/2007  . CORONARY ARTERY DISEASE 11/14/2007    prior bypass [phone]; Inferior MI 11/07; Severe 3 vessel disease; occluded SVG to RCA; SVG to OM subtotalled, SVG to diagonal with ostial 70 and 80 mid; EF 45. PCI of SVG to OM. FU myovue showed EF 56 with inferior infarct and very mild peri-infarct ischemia  . DEPRESSION 11/14/2007  . DIABETES MELLITUS, TYPE II 11/14/2007  . DIVERTICULITIS, HX OF 05/27/2007  . DVT (deep venous thrombosis) ([place]) 03/21/2011  . FOOT PAIN, BILATERAL 12/08/2010  . GERD 11/14/2007  . HYPERLIPIDEMIA 11/14/2007  . HYPERTENSION 11/14/2007  . HYPOTENSION, ORTHOSTATIC 03/06/2011  . Memory loss 06/01/2008  . OSTEOPOROSIS 06/02/2010  . PERIPHERAL VASCULAR DISEASE 11/14/2007  . RENAL INSUFFICIENCY 11/14/2007  . SECONDARY DM W/PERIPHERAL CIRC D/O UNCONTROLLED 11/07/2010  . SYNCOPE 09/08/2008  . Unspecified hearing loss 05/13/2009  . Prostate cancer ([place])   . Myalgia and myositis, unspecified 04/24/2011  . Other malaise and fatigue 04/24/2011  . Peripheral neuropathy ([place]) 03/27/2011  . Seborrheic keratosis 06/05/2011  . Osteoarthrosis, unspecified whether generalized or localized, unspecified site   .  Ischemic cardiomyopathy     a. echo (10/15):  mod focal basal septal hypertrophy, EF 40-45%, inf AK, Gr 1 DD, Al sclerosis, mild AI, MAC, mild MR, mod LAE, lipomatous hypertrophy, small effusion (no hemodynamic compromise)   . Effusion of left knee 03/17/2015  . Severe back pain   . DM  (diabetes mellitus), type 2 with renal complications ([place]) [id number]  . Failure to thrive in adult 05/03/2015  . Chronic combined systolic and diastolic CHF (congestive heart failure) ([place]) 10/10/2014  . CKD stage 3 due to type 1 diabetes mellitus ([place]) 03/17/2015  . Dyspnea 11/09/2014  . Hearing loss 05/13/2009    Qualifier: Diagnosis of  By: Jenny Reichmann MD, Hunt Oris   . Hyperlipemia 11/14/2007    Qualifier: Diagnosis of  By: Jenny Reichmann MD, Hunt Oris    . Generalized weakness 05/20/2015  . Protein-calorie malnutrition, severe ([place]) 05/23/2015  . Vertebral compression fracture ([place]) 09/05/2009    Qualifier: Diagnosis of  By: Jenny Reichmann MD, Hunt Oris   . [name] 04/04/2015  . Weight loss 10/10/2014   Past Surgical History  Procedure Laterality Date  . S/p coronary stent x 1    . Cholecystectomy    . S/p bilat knee replacement  Cleon Gustin, MD  . Pacemaker insertion  2012    Caryl Comes, MD  . Prostate needle biopsy    . Cystoscopy    . Appendectomy    . Coronary artery bypass graft  2007    Allergies  Allergen Reactions  . Hydrocodone     hallucination  . Tramadol     hallucination      Medication List       This list is accurate as of: 03/29/16  3:24 PM.  Always use your most recent med list.               BOOST DIABETIC Liqd  Take 237 mLs by mouth 3 (three) times daily between meals.     docusate sodium 100 MG capsule  Commonly known as:  COLACE  Take 100 mg by mouth 2 (two) times daily.     feeding supplement (PRO-STAT SUGAR FREE 64) Liqd  Take 30 mLs by mouth 2 (two) times daily with a meal.     fentaNYL 25 MCG/HR patch  Commonly known as:  DURAGESIC  Apply fresh patch every 3 days to help pain. Remove old patch.     furosemide 20 MG tablet  Commonly known as:  LASIX  Take 20 mg by mouth daily.     nitroGLYCERIN 0.4 MG SL tablet  Commonly known as:  NITROSTAT  Place 0.4 mg under the tongue every 5 (five) minutes as needed for chest pain.     omeprazole 20 MG capsule  Commonly known  as:  PRILOSEC  Take 20 mg by mouth daily.     spironolactone 25 MG tablet  Commonly known as:  ALDACTONE  Take 25 mg by mouth daily.     SYSTANE BALANCE OP  Place 1 drop into both eyes daily.        Review of Systems  Constitutional: Negative for fever and chills.  HENT: Positive for hearing loss. Negative for ear pain.   Eyes: Negative for photophobia and pain.       Corrective lenses  Respiratory: Positive for shortness of breath. Negative for cough and wheezing.        O2 dependent  Cardiovascular: Positive for leg swelling.       Pacemaker. History of  syncope related to cardiac arrhythmia. BLE edema trace R>L  Gastrointestinal: Negative for abdominal pain.  Endocrine: Negative for polydipsia.  Genitourinary: Positive for urgency and frequency. Negative for dysuria and flank pain.       Underwent Urology evaluation in the past, off meds. Episodes of incontinence persist.  Musculoskeletal: Positive for myalgias, back pain, gait problem and neck pain (Fall on 03/15/15 causing pain in the left knee and effusion.).       Poor balance and gait disturbance. Lower back pain Sever kyphosis  Skin:       Rough area on left cheek   Neurological: Negative for tremors, seizures and weakness.       Memory loss  Psychiatric/Behavioral:       Mild to moderate memory disturbance.    Immunization History  Administered Date(s) Administered  . Influenza Whole 09/23/2010, 09/23/2012  . Influenza,inj,Quad PF,36+ Mos 10/09/2014  . Influenza-Unspecified 09/07/2013, 09/10/2013, 09/30/2015  . PPD Test 05/23/2015  . Pneumococcal Polysaccharide-23 03/31/2009  . Td 03/31/2009   Pertinent  Health Maintenance Due  Topic Date Due  . OPHTHALMOLOGY EXAM  06/26/1933  . PNA vac Low Risk Adult (2 of 2 - PCV13) 03/31/2010  . URINE MICROALBUMIN  06/03/2011  . HEMOGLOBIN A1C  12/06/2015  . FOOT EXAM  03/16/2016  . INFLUENZA VACCINE  07/24/2016   Fall Risk  09/26/2015 05/26/2015 04/29/2015 04/04/2015  03/17/2015  Falls in the past year? Yes Yes Yes Yes Yes  Number falls in past yr: 1 1 1 1 2  or more  Injury with Fall? No Yes Yes Yes Yes  Risk Factor Category  - High Fall Risk - High Fall Risk High Fall Risk  Risk for fall due to : Impaired balance/gait History of fall(s);Impaired balance/gait;Impaired mobility - History of fall(s);Impaired balance/gait;Impaired mobility History of fall(s);Impaired balance/gait;Impaired mobility  Follow up Falls evaluation completed Falls evaluation completed - Falls evaluation completed Falls evaluation completed   Functional Status Survey:    Filed Vitals:   03/29/16 1514  BP: 119/68  Pulse: 81  Temp: 98 F (36.7 C)  Resp: 20  Height: 5\' 9"  (1.753 m)  Weight: 167 lb (75.751 kg)  SpO2: 94%   Body mass index is 24.65 kg/(m^2). Physical Exam  Constitutional: He is oriented to person, place, and time.  Frail  HENT:  Head: Normocephalic and atraumatic.  Nose: Nose normal.  Bilateral hearing aids.  Eyes: Conjunctivae are normal. Pupils are equal, round, and reactive to light.  Prescription lenses.  Neck: Normal range of motion. Neck supple. No JVD present. No thyromegaly present.  Cardiovascular: Normal rate and regular rhythm.  Exam reveals no gallop and no friction rub.   Murmur heard.  Systolic murmur is present with a grade of 1/6  Absent DP and PT  Pulmonary/Chest: Effort normal. He has no wheezes. He has rales. He exhibits no tenderness.  Using portable oxygen at 2 L/m today. Moist rales posterior mid to lower lungs.   Abdominal: Soft. Bowel sounds are normal. He exhibits no distension. There is no tenderness.  Musculoskeletal: Normal range of motion. He exhibits edema. He exhibits no tenderness.  Pain in the left SI joint area. Limps when walking. Favors the left side. Effusion of the left knee. Scars in the left knee from previous surgery. Tender at the left knee with movement of the patella. Positional lower back pain Server  kyphosis RLE edema trace  Neurological: He is alert and oriented to person, place, and time. He has normal strength. A sensory  deficit is present. No cranial nerve deficit. Gait abnormal.  07/13/14 MMSE 28/30. Passed clock drawing. Diminshed sensation to vabration and monofilament.  Skin: Skin is warm and dry. No erythema. No pallor.  Rough area on the left cheek. Cystic lesion in the mid forehead. Mid sternal surgical scar. Left upper chest pacemaker.  Psychiatric: He has a normal mood and affect. His behavior is normal. Thought content normal.  Patient is in denial regarding the extent of his memory deficit.    Labs reviewed:  Recent Labs  05/22/15 0533  12/05/15 1055  02/09/16 03/06/16 1448 03/29/16  NA 135  < > 139  < > 139 140 137  K 4.4  < > 4.3  < > 4.2 4.4 4.5  CL 107  --  104  --   --  101  --   CO2 21*  --  24  --   --  29  --   GLUCOSE 126*  --  210*  --   --  98  --   BUN 33*  < > 38*  < > 50* 45* 43*  CREATININE 0.92  < > 0.82  < > 1.3 1.15* 1.1  CALCIUM 8.6*  --  9.2  --   --  9.6  --   < > = values in this interval not displayed.  Recent Labs  05/03/15 1040  05/20/15 1341  01/12/16 02/09/16 03/29/16  AST 21  < > 35  < > 18 25 19   ALT 11  < > 19  < > 10 15 12   ALKPHOS 66  < > 122  < > 104 210* 131*  BILITOT 0.8  --  0.9  --   --   --   --   PROT 7.1  --  6.8  --   --   --   --   ALBUMIN 3.9  --  3.1*  --   --   --   --   < > = values in this interval not displayed.  Recent Labs  05/03/15 1040  05/20/15 1341 05/21/15 0523 05/22/15 0533  02/06/16 02/20/16 03/22/16  WBC 9.6  < > 8.3 6.3 5.4  < > 5.8 5.9 5.4  NEUTROABS 6.8  --  5.9  --   --   --   --   --   --   HGB  --   < > 13.1 11.5* 11.9*  < > 10.1* 11.2* 12.9*  HCT 42.1  < > 39.0 34.6* 35.3*  < > 33* 37* 40*  MCV 93  --  90.9 90.1 90.1  --   --   --   --   PLT  --   < > 182 147* 126*  < > 192 194 143*  < > = values in this interval not displayed. Lab Results  Component Value Date   TSH 3.16  06/06/2015   Lab Results  Component Value Date   HGBA1C 6.7* 06/06/2015   Lab Results  Component Value Date   CHOL 132 02/07/2015   HDL 33* 02/07/2015   LDLCALC 76 02/07/2015   LDLDIRECT 85.6 07/19/2010   TRIG 115 02/07/2015   CHOLHDL 4 03/06/2011    Significant Diagnostic Results in last 30 days:  No results found.  Assessment/Plan 1. Memory loss About the same. Confused much of the time.  2. Type 2 diabetes mellitus with stage 3 chronic kidney disease, without long-term current use of insulin (HCC) Controlled.  3. Chronic  combined systolic and diastolic CHF (congestive heart failure) ([place]) Compensated  4. Essential hypertension Controlled  5. Failure to thrive in adult Stable to improved  6. Iron deficiency anemia Steady improvement.

## 2016-04-10 ENCOUNTER — Non-Acute Institutional Stay (SKILLED_NURSING_FACILITY): Payer: Medicare Other | Admitting: Nurse Practitioner

## 2016-04-10 ENCOUNTER — Encounter: Payer: Self-pay | Admitting: Nurse Practitioner

## 2016-04-10 DIAGNOSIS — H9193 Unspecified hearing loss, bilateral: Secondary | ICD-10-CM

## 2016-04-10 DIAGNOSIS — M5440 Lumbago with sciatica, unspecified side: Secondary | ICD-10-CM

## 2016-04-10 DIAGNOSIS — N183 Chronic kidney disease, stage 3 unspecified: Secondary | ICD-10-CM

## 2016-04-10 DIAGNOSIS — I2510 Atherosclerotic heart disease of native coronary artery without angina pectoris: Secondary | ICD-10-CM

## 2016-04-10 DIAGNOSIS — J9610 Chronic respiratory failure, unspecified whether with hypoxia or hypercapnia: Secondary | ICD-10-CM

## 2016-04-10 DIAGNOSIS — E1022 Type 1 diabetes mellitus with diabetic chronic kidney disease: Secondary | ICD-10-CM

## 2016-04-10 DIAGNOSIS — I5042 Chronic combined systolic (congestive) and diastolic (congestive) heart failure: Secondary | ICD-10-CM

## 2016-04-10 DIAGNOSIS — D509 Iron deficiency anemia, unspecified: Secondary | ICD-10-CM | POA: Diagnosis not present

## 2016-04-10 DIAGNOSIS — I2583 Coronary atherosclerosis due to lipid rich plaque: Secondary | ICD-10-CM

## 2016-04-10 NOTE — Assessment & Plan Note (Signed)
Continue O2 [place]

## 2016-04-10 NOTE — Assessment & Plan Note (Signed)
Baseline creat 1-1.5

## 2016-04-10 NOTE — Assessment & Plan Note (Signed)
No angina since admitted to SNF. Prn NTG, statin, and off Xarelto 

## 2016-04-10 NOTE — Assessment & Plan Note (Signed)
Worse after ear wax lavage, otoscope: moist external canal, opacity appearance TM R+L, denied pain upon ear examination, no noted drainage, erythema, or effusion seen, will observe for now

## 2016-04-10 NOTE — Assessment & Plan Note (Signed)
BNP 1449.1 Noted increased edema, SOB, moist rales posterior mid to lower lungs Compensated clinically, improved, continue Furosemide 20mg , Spironolactone 25mg  daily. 02/09/16 BNP 1959.3.

## 2016-04-10 NOTE — Progress Notes (Signed)
Patient ID: TURKI BLOK, male   DOB: 04/15/1923, 82 y.o.   MRN: [id number]  Location:  [facility] Room Number: N 9 Place of Service:  SNF (31) Provider: Lennie Odor Nanami Whitelaw NP  Estill Dooms, MD  Patient Care Team: Estill Dooms, MD as PCP - General (Internal Medicine) Irine Seal, MD as Consulting Physician (Urology) Renella Cunas, MD as Consulting Physician (Cardiology) [facility] Lelon Perla, MD as Consulting Physician (Cardiology) Mylissa Lambe X, NP as Nurse Practitioner (Nurse Practitioner) Rigoberto Noel, MD as Consulting Physician (Pulmonary Disease)  Extended Emergency Contact Information Primary Emergency Contact: Kathline Magic, Rosezella Rumpf of [place] Phone: [phone] Relation: Daughter Secondary Emergency Contact: Johnnette Gourd [place] of [place] Phone: [phone] Mobile Phone: [phone] Relation: Son  Code Status:  DNR Goals of care: Advanced Directive information Advanced Directives 04/10/2016  Does patient have an advance directive? Yes  Type of Paramedic of [place];Out of facility DNR (pink MOST or yellow form)  Does patient want to make changes to advanced directive? No - Patient declined  Copy of advanced directive(s) in chart? Yes  Pre-existing out of facility DNR order (yellow form or pink MOST form) -     Chief Complaint  Patient presents with  . Acute Visit    Patient can not hear out of ears, ears were debroxed and flushed and patient still can not hear    HPI:  Pt is a 82 y.o. male seen today for medical management of chronic diseases.  Hx of Afib, heart rate is in control, dementia, depression, stable,  HTN, controlled while on furosemide 20mg  and Spironolactone 25mg , anemia, Hgb 7.6, dropped from 11.4 12/22/15, repeated Hgb 7.3 01/09/16, no apparent bleeding noted, Eliquis/ASA held, fluid retention may contributory to the low Hgbs., improved to 10s  after the patient is gently diuresed and ASA/Elliquis dc'd, on Fe 325mg  daily. CHF BNP 1449.1 01/02/16, the patient is apparently has increased edema, moist rales posterior mid to lower lungs, SOB easily with exertion, no significant change since last seen, on Furosemide 20mg  and Spironolactone 25mg  daily.    C/o increased HOH, denied ear pain, nasal congestion, facial pressure, or sore throat, impacted ear wax removed, but no improvement of his hearing, tympanic membrane  R+L opacity and moist appearance.    Past Medical History  Diagnosis Date  . ANEMIA-IRON DEFICIENCY 05/27/2007  . ANXIETY 11/14/2007  . Intermittent high-grade heart block 09/05/2009    s/p pacemaker  . BENIGN PROSTATIC HYPERTROPHY 11/14/2007  . CAROTID ARTERY DISEASE 10/02/2010  . Pacemaker-St. Jude 02/02/2008    [employer]. Jude  . COLONIC POLYPS, HX OF 05/27/2007  . CORONARY ARTERY DISEASE 11/14/2007    prior bypass [phone]; Inferior MI 11/07; Severe 3 vessel disease; occluded SVG to RCA; SVG to OM subtotalled, SVG to diagonal with ostial 70 and 80 mid; EF 45. PCI of SVG to OM. FU myovue showed EF 56 with inferior infarct and very mild peri-infarct ischemia  . DEPRESSION 11/14/2007  . DIABETES MELLITUS, TYPE II 11/14/2007  . DIVERTICULITIS, HX OF 05/27/2007  . DVT (deep venous thrombosis) ([place]) 03/21/2011  . FOOT PAIN, BILATERAL 12/08/2010  . GERD 11/14/2007  . HYPERLIPIDEMIA 11/14/2007  . HYPERTENSION 11/14/2007  . HYPOTENSION, ORTHOSTATIC 03/06/2011  . Memory loss 06/01/2008  . OSTEOPOROSIS 06/02/2010  . PERIPHERAL VASCULAR DISEASE 11/14/2007  . RENAL INSUFFICIENCY 11/14/2007  . SECONDARY DM W/PERIPHERAL CIRC D/O  UNCONTROLLED 11/07/2010  . SYNCOPE 09/08/2008  . Unspecified hearing loss 05/13/2009  . Prostate cancer ([place])   . Myalgia and myositis, unspecified 04/24/2011  . Other malaise and fatigue 04/24/2011  . Peripheral neuropathy ([place]) 03/27/2011  . Seborrheic keratosis 06/05/2011  . Osteoarthrosis, unspecified whether  generalized or localized, unspecified site   . Ischemic cardiomyopathy     a. echo (10/15):  mod focal basal septal hypertrophy, EF 40-45%, inf AK, Gr 1 DD, Al sclerosis, mild AI, MAC, mild MR, mod LAE, lipomatous hypertrophy, small effusion (no hemodynamic compromise)   . Effusion of left knee 03/17/2015  . Severe back pain   . DM (diabetes mellitus), type 2 with renal complications ([place]) [id number]  . Failure to thrive in adult 05/03/2015  . Chronic combined systolic and diastolic CHF (congestive heart failure) ([place]) 10/10/2014  . CKD stage 3 due to type 1 diabetes mellitus ([place]) 03/17/2015  . Dyspnea 11/09/2014  . Hearing loss 05/13/2009    Qualifier: Diagnosis of  By: Jenny Reichmann MD, Hunt Oris   . Hyperlipemia 11/14/2007    Qualifier: Diagnosis of  By: Jenny Reichmann MD, Hunt Oris    . Generalized weakness 05/20/2015  . Protein-calorie malnutrition, severe ([place]) 05/23/2015  . Vertebral compression fracture ([place]) 09/05/2009    Qualifier: Diagnosis of  By: Jenny Reichmann MD, Hunt Oris   . [name] 04/04/2015  . Weight loss 10/10/2014   Past Surgical History  Procedure Laterality Date  . S/p coronary stent x 1    . Cholecystectomy    . S/p bilat knee replacement  Cleon Gustin, MD  . Pacemaker insertion  2012    Caryl Comes, MD  . Prostate needle biopsy    . Cystoscopy    . Appendectomy    . Coronary artery bypass graft  2007    Allergies  Allergen Reactions  . Hydrocodone     hallucination  . Tramadol     hallucination      Medication List       This list is accurate as of: 04/10/16  1:13 PM.  Always use your most recent med list.               BOOST DIABETIC Liqd  Take 237 mLs by mouth 3 (three) times daily between meals.     docusate sodium 100 MG capsule  Commonly known as:  COLACE  Take 100 mg by mouth 2 (two) times daily.     feeding supplement (PRO-STAT SUGAR FREE 64) Liqd  Take 30 mLs by mouth 2 (two) times daily with a meal.     fentaNYL 25 MCG/HR patch  Commonly known as:  DURAGESIC  Apply  fresh patch every 3 days to help pain. Remove old patch.     furosemide 20 MG tablet  Commonly known as:  LASIX  Take 20 mg by mouth daily.     nitroGLYCERIN 0.4 MG SL tablet  Commonly known as:  NITROSTAT  Place 0.4 mg under the tongue every 5 (five) minutes as needed for chest pain.     omeprazole 20 MG capsule  Commonly known as:  PRILOSEC  Take 20 mg by mouth daily.     spironolactone 25 MG tablet  Commonly known as:  ALDACTONE  Take 25 mg by mouth daily.     SYSTANE BALANCE OP  Place 1 drop into both eyes daily.        Review of Systems  Constitutional: Negative for fever and chills.  HENT: Positive for hearing loss.  Negative for ear pain.   Eyes: Negative for photophobia and pain.       Corrective lenses  Respiratory: Positive for shortness of breath. Negative for cough and wheezing.        O2 dependent  Cardiovascular: Positive for leg swelling.       Pacemaker. History of syncope related to cardiac arrhythmia. BLE edema trace R>L  Gastrointestinal: Negative for abdominal pain.  Endocrine: Negative for polydipsia.  Genitourinary: Positive for urgency and frequency. Negative for dysuria and flank pain.       Underwent Urology evaluation in the past, off meds. Episodes of incontinence persist.  Musculoskeletal: Positive for myalgias, back pain, gait problem and neck pain (Fall on 03/15/15 causing pain in the left knee and effusion.).       Poor balance and gait disturbance. Lower back pain Sever kyphosis  Skin:       Rough area on left cheek   Neurological: Negative for tremors, seizures and weakness.       Memory loss  Psychiatric/Behavioral:       Mild to moderate memory disturbance.    Immunization History  Administered Date(s) Administered  . Influenza Whole 09/23/2010, 09/23/2012  . Influenza,inj,Quad PF,36+ Mos 10/09/2014  . Influenza-Unspecified 09/07/2013, 09/10/2013, 09/30/2015  . PPD Test 05/23/2015  . Pneumococcal Polysaccharide-23 03/31/2009    . Td 03/31/2009   Pertinent  Health Maintenance Due  Topic Date Due  . OPHTHALMOLOGY EXAM  06/26/1933  . PNA vac Low Risk Adult (2 of 2 - PCV13) 03/31/2010  . URINE MICROALBUMIN  06/03/2011  . HEMOGLOBIN A1C  12/06/2015  . FOOT EXAM  03/16/2016  . INFLUENZA VACCINE  07/24/2016   Fall Risk  09/26/2015 05/26/2015 04/29/2015 04/04/2015 03/17/2015  Falls in the past year? Yes Yes Yes Yes Yes  Number falls in past yr: 1 1 1 1 2  or more  Injury with Fall? No Yes Yes Yes Yes  Risk Factor Category  - High Fall Risk - High Fall Risk High Fall Risk  Risk for fall due to : Impaired balance/gait History of fall(s);Impaired balance/gait;Impaired mobility - History of fall(s);Impaired balance/gait;Impaired mobility History of fall(s);Impaired balance/gait;Impaired mobility  Follow up Falls evaluation completed Falls evaluation completed - Falls evaluation completed Falls evaluation completed   Functional Status Survey:    Filed Vitals:   04/10/16 0915  BP: 110/70  Pulse: 100  Temp: 98.1 F (36.7 C)  TempSrc: Oral  Resp: 18  Height: 5\' 9"  (1.753 m)  Weight: 167 lb (75.751 kg)  SpO2: 92%   Body mass index is 24.65 kg/(m^2). Physical Exam  Constitutional: He is oriented to person, place, and time.  Frail  HENT:  Head: Normocephalic and atraumatic.  Right Ear: No drainage, swelling or tenderness. No foreign bodies. No mastoid tenderness. Tympanic membrane is not injected, not scarred, not perforated, not erythematous, not retracted and not bulging. Tympanic membrane mobility is normal. No hemotympanum. Decreased hearing is noted.  Left Ear: No drainage, swelling or tenderness. No foreign bodies. No mastoid tenderness. Tympanic membrane is not injected, not scarred, not perforated, not erythematous, not retracted and not bulging. Tympanic membrane mobility is normal. No hemotympanum. Decreased hearing is noted.  Nose: Nose normal.  Bilateral hearing aids.  Eyes: Conjunctivae are normal. Pupils  are equal, round, and reactive to light.  Prescription lenses.  Neck: Normal range of motion. Neck supple. No JVD present. No thyromegaly present.  Cardiovascular: Normal rate and regular rhythm.  Exam reveals no gallop and no friction rub.  Murmur heard.  Systolic murmur is present with a grade of 1/6  Absent DP and PT  Pulmonary/Chest: Effort normal. He has no wheezes. He has rales. He exhibits no tenderness.  Using portable oxygen at 2 L/m today. Moist rales posterior mid to lower lungs.   Abdominal: Soft. Bowel sounds are normal. He exhibits no distension. There is no tenderness.  Musculoskeletal: Normal range of motion. He exhibits edema. He exhibits no tenderness.  Pain in the left SI joint area. Limps when walking. Favors the left side. Effusion of the left knee. Scars in the left knee from previous surgery. Tender at the left knee with movement of the patella. Positional lower back pain Server kyphosis RLE edema trace  Neurological: He is alert and oriented to person, place, and time. He has normal strength. A sensory deficit is present. No cranial nerve deficit. Gait abnormal.  07/13/14 MMSE 28/30. Passed clock drawing. Diminshed sensation to vabration and monofilament.  Skin: Skin is warm and dry. No erythema. No pallor.  Rough area on the left cheek. Cystic lesion in the mid forehead. Mid sternal surgical scar. Left upper chest pacemaker.     Psychiatric: He has a normal mood and affect. His behavior is normal. Thought content normal.  Patient is in denial regarding the extent of his memory deficit.    Labs reviewed:  Recent Labs  05/22/15 0533  12/05/15 1055  02/09/16 03/06/16 1448 03/29/16  NA 135  < > 139  < > 139 140 137  K 4.4  < > 4.3  < > 4.2 4.4 4.5  CL 107  --  104  --   --  101  --   CO2 21*  --  24  --   --  29  --   GLUCOSE 126*  --  210*  --   --  98  --   BUN 33*  < > 38*  < > 50* 45* 43*  CREATININE 0.92  < > 0.82  < > 1.3 1.15* 1.1  CALCIUM 8.6*   --  9.2  --   --  9.6  --   < > = values in this interval not displayed.  Recent Labs  05/03/15 1040  05/20/15 1341  01/12/16 02/09/16 03/29/16  AST 21  < > 35  < > 18 25 19   ALT 11  < > 19  < > 10 15 12   ALKPHOS 66  < > 122  < > 104 210* 131*  BILITOT 0.8  --  0.9  --   --   --   --   PROT 7.1  --  6.8  --   --   --   --   ALBUMIN 3.9  --  3.1*  --   --   --   --   < > = values in this interval not displayed.  Recent Labs  05/03/15 1040  05/20/15 1341 05/21/15 0523 05/22/15 0533  02/06/16 02/20/16 03/22/16  WBC 9.6  < > 8.3 6.3 5.4  < > 5.8 5.9 5.4  NEUTROABS 6.8  --  5.9  --   --   --   --   --   --   HGB  --   < > 13.1 11.5* 11.9*  < > 10.1* 11.2* 12.9*  HCT 42.1  < > 39.0 34.6* 35.3*  < > 33* 37* 40*  MCV 93  --  90.9 90.1 90.1  --   --   --   --  PLT  --   < > 182 147* 126*  < > 192 194 143*  < > = values in this interval not displayed. Lab Results  Component Value Date   TSH 3.16 06/06/2015   Lab Results  Component Value Date   HGBA1C 6.7* 06/06/2015   Lab Results  Component Value Date   CHOL 132 02/07/2015   HDL 33* 02/07/2015   LDLCALC 76 02/07/2015   LDLDIRECT 85.6 07/19/2010   TRIG 115 02/07/2015   CHOLHDL 4 03/06/2011    Significant Diagnostic Results in last 30 days:  No results found.  Assessment/Plan Hearing loss Worse after ear wax lavage, otoscope: moist external canal, opacity appearance TM R+L, denied pain upon ear examination, no noted drainage, erythema, or effusion seen, will observe for now  Backache Continue Fentanyl 26mcg/hr, off Tylenol 1000mg    CAD (coronary artery disease) No angina since admitted to SNF. Prn NTG, statin, and off Xarelto   Chronic combined systolic and diastolic CHF (congestive heart failure) (H[facility]) BNP 1449.1 Noted increased edema, SOB, moist rales posterior mid to lower lungs Compensated clinically, improved, continue Furosemide 20mg , Spironolactone 25mg  daily. 02/09/16 BNP 1959.3.   Chronic respiratory  failure Continue O2 [place]  CKD stage 3 due to type 1 diabetes mellitus (HCC) Baseline creat 1-1.5  Iron deficiency anemia 02/06/16 Hgb 10.1, 02/20/16 Hgb 11.2, 03/22/16 Hgb 12.9, off Fe, continue off ASA/Elliquis. Update CBC in one month    Family/ staff Communication: continue SNF for care needs.   Labs/tests ordered: pending CBC

## 2016-04-10 NOTE — Assessment & Plan Note (Signed)
02/06/16 Hgb 10.1, 02/20/16 Hgb 11.2, 03/22/16 Hgb 12.9, off Fe, continue off ASA/Elliquis. Update CBC in one month

## 2016-04-10 NOTE — Assessment & Plan Note (Signed)
Continue Fentanyl 35mcg/hr, off Tylenol 1000mg 

## 2016-04-24 ENCOUNTER — Encounter: Payer: Self-pay | Admitting: Nurse Practitioner

## 2016-04-24 ENCOUNTER — Non-Acute Institutional Stay (SKILLED_NURSING_FACILITY): Payer: Medicare Other | Admitting: Nurse Practitioner

## 2016-04-24 DIAGNOSIS — I2510 Atherosclerotic heart disease of native coronary artery without angina pectoris: Secondary | ICD-10-CM

## 2016-04-24 DIAGNOSIS — M5440 Lumbago with sciatica, unspecified side: Secondary | ICD-10-CM | POA: Diagnosis not present

## 2016-04-24 DIAGNOSIS — K219 Gastro-esophageal reflux disease without esophagitis: Secondary | ICD-10-CM

## 2016-04-24 DIAGNOSIS — F329 Major depressive disorder, single episode, unspecified: Secondary | ICD-10-CM

## 2016-04-24 DIAGNOSIS — J9610 Chronic respiratory failure, unspecified whether with hypoxia or hypercapnia: Secondary | ICD-10-CM | POA: Diagnosis not present

## 2016-04-24 DIAGNOSIS — I2583 Coronary atherosclerosis due to lipid rich plaque: Secondary | ICD-10-CM

## 2016-04-24 DIAGNOSIS — E1022 Type 1 diabetes mellitus with diabetic chronic kidney disease: Secondary | ICD-10-CM | POA: Diagnosis not present

## 2016-04-24 DIAGNOSIS — D509 Iron deficiency anemia, unspecified: Secondary | ICD-10-CM | POA: Diagnosis not present

## 2016-04-24 DIAGNOSIS — K5900 Constipation, unspecified: Secondary | ICD-10-CM

## 2016-04-24 DIAGNOSIS — E1142 Type 2 diabetes mellitus with diabetic polyneuropathy: Secondary | ICD-10-CM

## 2016-04-24 DIAGNOSIS — N183 Chronic kidney disease, stage 3 unspecified: Secondary | ICD-10-CM

## 2016-04-24 DIAGNOSIS — I5042 Chronic combined systolic (congestive) and diastolic (congestive) heart failure: Secondary | ICD-10-CM | POA: Diagnosis not present

## 2016-04-24 DIAGNOSIS — I10 Essential (primary) hypertension: Secondary | ICD-10-CM

## 2016-04-24 DIAGNOSIS — R413 Other amnesia: Secondary | ICD-10-CM

## 2016-04-24 DIAGNOSIS — F32A Depression, unspecified: Secondary | ICD-10-CM

## 2016-04-24 DIAGNOSIS — I480 Paroxysmal atrial fibrillation: Secondary | ICD-10-CM

## 2016-04-24 NOTE — Assessment & Plan Note (Signed)
Baseline creat 1-1.5

## 2016-04-24 NOTE — Assessment & Plan Note (Signed)
Continue Fentanyl 65mcg/hr, off Tylenol 1000mg 

## 2016-04-24 NOTE — Assessment & Plan Note (Signed)
Controlled, continue Furosemide 20mg  and Spironolactone 25mg  daily.

## 2016-04-24 NOTE — Assessment & Plan Note (Signed)
06/06/15 Hgb A1c 6.7 diet control.   

## 2016-04-24 NOTE — Assessment & Plan Note (Signed)
Stable, continue 06/10/15 Lactulose prn. Colace bid.  

## 2016-04-24 NOTE — Assessment & Plan Note (Signed)
Mood is stable, not on a mood stabilizer.  

## 2016-04-24 NOTE — Assessment & Plan Note (Signed)
Progressing. off Namenda R>B. SNF for care needs. Comfort measures.   

## 2016-04-24 NOTE — Assessment & Plan Note (Signed)
02/06/16 Hgb 10.1, 02/20/16 Hgb 11.2, 03/22/16 Hgb 12.9, off Fe, continue off ASA/Elliquis.

## 2016-04-24 NOTE — Assessment & Plan Note (Signed)
Rate is in control w/o medications. off Eliquis 2nd to Hgb 7s.  

## 2016-04-24 NOTE — Assessment & Plan Note (Signed)
BNP 1449.1 Noted increased edema, SOB, moist rales posterior mid to lower lungs Compensated clinically, improved, continue Furosemide 20mg , Spironolactone 25mg  daily. 02/09/16 BNP 1959.3.

## 2016-04-24 NOTE — Assessment & Plan Note (Signed)
No angina since admitted to SNF. Prn NTG, statin, and off Xarelto 

## 2016-04-24 NOTE — Progress Notes (Signed)
Patient ID: Zachary Harris, male   DOB: 1923/07/22, 82 y.o.   MRN: [id number]  Location:  [place] Room Number: N 9 Place of Service:  SNF (31) Provider: Lennie Odor Mast NP  Estill Dooms, MD  Patient Care Team: Estill Dooms, MD as PCP - General (Internal Medicine) Irine Seal, MD as Consulting Physician (Urology) Renella Cunas, MD as Consulting Physician (Cardiology) [facility] Lelon Perla, MD as Consulting Physician (Cardiology) Man Mast Rhunette Croft, NP as Nurse Practitioner (Nurse Practitioner) Rigoberto Noel, MD as Consulting Physician (Pulmonary Disease)  Extended Emergency Contact Information Primary Emergency Contact: Kathline Magic, Rosezella Rumpf of [place] Phone: [phone] Relation: Daughter Secondary Emergency Contact: Johnnette Gourd [place] of [place] Phone: [phone] Mobile Phone: [phone] Relation: Son  Code Status:  DNR Goals of care: Advanced Directive information Advanced Directives 04/24/2016  Does patient have an advance directive? Yes  Type of Paramedic of [place];Out of facility DNR (pink MOST or yellow form)  Does patient want to make changes to advanced directive? No - Patient declined  Copy of advanced directive(s) in chart? Yes     Chief Complaint  Patient presents with  . Medical Management of Chronic Issues    Routine Visit    HPI:  Pt is a 82 y.o. male seen today for medical management of chronic diseases.  Hx of Afib, heart rate is in control, dementia, depression, stable,  HTN, controlled while on furosemide 20mg  and Spironolactone 25mg , anemia, Hgb 7.6, dropped from 11.4 12/22/15, repeated Hgb 7.3 01/09/16, no apparent bleeding noted, Eliquis/ASA held, fluid retention may contributory to the low Hgbs., improved to 10s after the patient is gently diuresed and ASA/Elliquis dc'd, on Fe 325mg  daily. CHF BNP 1449.1 01/02/16, the patient is apparently has  increased edema, moist rales posterior mid to lower lungs, SOB easily with exertion, no significant change since last seen, on Furosemide 20mg  and Spironolactone 25mg  daily.    Past Medical History  Diagnosis Date  . ANEMIA-IRON DEFICIENCY 05/27/2007  . ANXIETY 11/14/2007  . Intermittent high-grade heart block 09/05/2009    s/p pacemaker  . BENIGN PROSTATIC HYPERTROPHY 11/14/2007  . CAROTID ARTERY DISEASE 10/02/2010  . Pacemaker-St. Jude 02/02/2008    St. Jude  . COLONIC POLYPS, HX OF 05/27/2007  . CORONARY ARTERY DISEASE 11/14/2007    prior bypass [id number]; Inferior MI 11/07; Severe 3 vessel disease; occluded SVG to RCA; SVG to OM subtotalled, SVG to diagonal with ostial 70 and 80 mid; EF 45. PCI of SVG to OM. FU myovue showed EF 56 with inferior infarct and very mild peri-infarct ischemia  . DEPRESSION 11/14/2007  . DIABETES MELLITUS, TYPE II 11/14/2007  . DIVERTICULITIS, HX OF 05/27/2007  . DVT (deep venous thrombosis) ([place]) 03/21/2011  . FOOT PAIN, BILATERAL 12/08/2010  . GERD 11/14/2007  . HYPERLIPIDEMIA 11/14/2007  . HYPERTENSION 11/14/2007  . HYPOTENSION, ORTHOSTATIC 03/06/2011  . Memory loss 06/01/2008  . OSTEOPOROSIS 06/02/2010  . PERIPHERAL VASCULAR DISEASE 11/14/2007  . RENAL INSUFFICIENCY 11/14/2007  . SECONDARY DM W/PERIPHERAL CIRC D/O UNCONTROLLED 11/07/2010  . SYNCOPE 09/08/2008  . Unspecified hearing loss 05/13/2009  . Prostate cancer ([place])   . Myalgia and myositis, unspecified 04/24/2011  . Other malaise and fatigue 04/24/2011  . Peripheral neuropathy ([place]) 03/27/2011  . Seborrheic keratosis 06/05/2011  . Osteoarthrosis, unspecified whether generalized or localized, unspecified site   . Ischemic cardiomyopathy  a. echo (10/15):  mod focal basal septal hypertrophy, EF 40-45%, inf AK, Gr 1 DD, Al sclerosis, mild AI, MAC, mild MR, mod LAE, lipomatous hypertrophy, small effusion (no hemodynamic compromise)   . Effusion of left knee 03/17/2015  . Severe back pain   . DM (diabetes  mellitus), type 2 with renal complications ([place]) [id number]  . Failure to thrive in adult 05/03/2015  . Chronic combined systolic and diastolic CHF (congestive heart failure) ([place]) 10/10/2014  . CKD stage 3 due to type 1 diabetes mellitus ([place]) 03/17/2015  . Dyspnea 11/09/2014  . Hearing loss 05/13/2009    Qualifier: Diagnosis of  By: Jenny Reichmann MD, Hunt Oris   . Hyperlipemia 11/14/2007    Qualifier: Diagnosis of  By: Jenny Reichmann MD, Hunt Oris    . Generalized weakness 05/20/2015  . Protein-calorie malnutrition, severe ([place]) 05/23/2015  . Vertebral compression fracture ([place]) 09/05/2009    Qualifier: Diagnosis of  By: Jenny Reichmann MD, Hunt Oris   . [name] 04/04/2015  . Weight loss 10/10/2014   Past Surgical History  Procedure Laterality Date  . S/p coronary stent x 1    . Cholecystectomy    . S/p bilat knee replacement  Cleon Gustin, MD  . Pacemaker insertion  2012    Caryl Comes, MD  . Prostate needle biopsy    . Cystoscopy    . Appendectomy    . Coronary artery bypass graft  2007    Allergies  Allergen Reactions  . Hydrocodone     hallucination  . Tramadol     hallucination      Medication List       This list is accurate as of: 04/24/16  5:39 PM.  Always use your most recent med list.               BOOST DIABETIC Liqd  Take 237 mLs by mouth 3 (three) times daily between meals.     docusate sodium 100 MG capsule  Commonly known as:  COLACE  Take 100 mg by mouth 2 (two) times daily.     feeding supplement (PRO-STAT SUGAR FREE 64) Liqd  Take 30 mLs by mouth 2 (two) times daily with a meal.     fentaNYL 25 MCG/HR patch  Commonly known as:  DURAGESIC  Apply fresh patch every 3 days to help pain. Remove old patch.     ferrous sulfate 325 (65 FE) MG tablet  Take 325 mg by mouth daily with breakfast.     furosemide 20 MG tablet  Commonly known as:  LASIX  Take 20 mg by mouth daily.     nitroGLYCERIN 0.4 MG SL tablet  Commonly known as:  NITROSTAT  Place 0.4 mg under the tongue every 5 (five)  minutes as needed for chest pain.     omeprazole 20 MG capsule  Commonly known as:  PRILOSEC  Take 20 mg by mouth daily.     spironolactone 25 MG tablet  Commonly known as:  ALDACTONE  Take 25 mg by mouth daily.     SYSTANE BALANCE OP  Place 1 drop into both eyes daily.        Review of Systems  Constitutional: Negative for fever and chills.  HENT: Positive for hearing loss. Negative for ear pain.   Eyes: Negative for photophobia and pain.       Corrective lenses  Respiratory: Positive for shortness of breath. Negative for cough and wheezing.        O2 dependent  Cardiovascular: Positive for leg swelling.       Pacemaker. History of syncope related to cardiac arrhythmia. BLE edema trace R>L  Gastrointestinal: Negative for abdominal pain.  Endocrine: Negative for polydipsia.  Genitourinary: Positive for urgency and frequency. Negative for dysuria and flank pain.       Underwent Urology evaluation in the past, off meds. Episodes of incontinence persist.  Musculoskeletal: Positive for myalgias, back pain, gait problem and neck pain (Fall on 03/15/15 causing pain in the left knee and effusion.).       Poor balance and gait disturbance. Lower back pain Sever kyphosis  Skin:       Rough area on left cheek   Neurological: Negative for tremors, seizures and weakness.       Memory loss  Psychiatric/Behavioral:       Mild to moderate memory disturbance.    Immunization History  Administered Date(s) Administered  . Influenza Whole 09/23/2010, 09/23/2012  . Influenza,inj,Quad PF,36+ Mos 10/09/2014  . Influenza-Unspecified 09/07/2013, 09/10/2013, 09/30/2015  . PPD Test 05/23/2015  . Pneumococcal Polysaccharide-23 03/31/2009  . Td 03/31/2009   Pertinent  Health Maintenance Due  Topic Date Due  . OPHTHALMOLOGY EXAM  06/26/1933  . PNA vac Low Risk Adult (2 of 2 - PCV13) 03/31/2010  . URINE MICROALBUMIN  06/03/2011  . HEMOGLOBIN A1C  12/06/2015  . FOOT EXAM  03/16/2016  .  INFLUENZA VACCINE  07/24/2016   Fall Risk  09/26/2015 05/26/2015 04/29/2015 04/04/2015 03/17/2015  Falls in the past year? Yes Yes Yes Yes Yes  Number falls in past yr: 1 1 1 1 2  or more  Injury with Fall? No Yes Yes Yes Yes  Risk Factor Category  - High Fall Risk - High Fall Risk High Fall Risk  Risk for fall due to : Impaired balance/gait History of fall(s);Impaired balance/gait;Impaired mobility - History of fall(s);Impaired balance/gait;Impaired mobility History of fall(s);Impaired balance/gait;Impaired mobility  Follow up Falls evaluation completed Falls evaluation completed - Falls evaluation completed Falls evaluation completed   Functional Status Survey:    Filed Vitals:   04/24/16 0906  BP: 120/70  Pulse: 76  Temp: 97.4 F (36.3 C)  TempSrc: Oral  Resp: 20  Height: 5\' 9"  (1.753 m)  Weight: 167 lb (75.751 kg)  SpO2: 97%   Body mass index is 24.65 kg/(m^2). Physical Exam  Constitutional: He is oriented to person, place, and time.  Frail  HENT:  Head: Normocephalic and atraumatic.  Right Ear: No drainage, swelling or tenderness. No foreign bodies. No mastoid tenderness. Tympanic membrane is not injected, not scarred, not perforated, not erythematous, not retracted and not bulging. Tympanic membrane mobility is normal. No hemotympanum. Decreased hearing is noted.  Left Ear: No drainage, swelling or tenderness. No foreign bodies. No mastoid tenderness. Tympanic membrane is not injected, not scarred, not perforated, not erythematous, not retracted and not bulging. Tympanic membrane mobility is normal. No hemotympanum. Decreased hearing is noted.  Nose: Nose normal.  Bilateral hearing aids.  Eyes: Conjunctivae are normal. Pupils are equal, round, and reactive to light.  Prescription lenses.  Neck: Normal range of motion. Neck supple. No JVD present. No thyromegaly present.  Cardiovascular: Normal rate and regular rhythm.  Exam reveals no gallop and no friction rub.   Murmur heard.   Systolic murmur is present with a grade of 1/6  Absent DP and PT  Pulmonary/Chest: Effort normal. He has no wheezes. He has rales. He exhibits no tenderness.  Using portable oxygen at 2 L/m today. Moist rales  posterior mid to lower lungs.   Abdominal: Soft. Bowel sounds are normal. He exhibits no distension. There is no tenderness.  Musculoskeletal: Normal range of motion. He exhibits edema. He exhibits no tenderness.  Pain in the left SI joint area. Limps when walking. Favors the left side. Effusion of the left knee. Scars in the left knee from previous surgery. Tender at the left knee with movement of the patella. Positional lower back pain Server kyphosis RLE edema trace  Neurological: He is alert and oriented to person, place, and time. He has normal strength. A sensory deficit is present. No cranial nerve deficit. Gait abnormal.  07/13/14 MMSE 28/30. Passed clock drawing. Diminshed sensation to vabration and monofilament.  Skin: Skin is warm and dry. No erythema. No pallor.  Rough area on the left cheek. Cystic lesion in the mid forehead. Mid sternal surgical scar. Left upper chest pacemaker.     Psychiatric: He has a normal mood and affect. His behavior is normal. Thought content normal.  Patient is in denial regarding the extent of his memory deficit.    Labs reviewed:  Recent Labs  05/22/15 0533  12/05/15 1055  02/09/16 03/06/16 1448 03/29/16  NA 135  < > 139  < > 139 140 137  K 4.4  < > 4.3  < > 4.2 4.4 4.5  CL 107  --  104  --   --  101  --   CO2 21*  --  24  --   --  29  --   GLUCOSE 126*  --  210*  --   --  98  --   BUN 33*  < > 38*  < > 50* 45* 43*  CREATININE 0.92  < > 0.82  < > 1.3 1.15* 1.1  CALCIUM 8.6*  --  9.2  --   --  9.6  --   < > = values in this interval not displayed.  Recent Labs  05/03/15 1040  05/20/15 1341  01/12/16 02/09/16 03/29/16  AST 21  < > 35  < > 18 25 19   ALT 11  < > 19  < > 10 15 12   ALKPHOS 66  < > 122  < > 104 210* 131*  BILITOT  0.8  --  0.9  --   --   --   --   PROT 7.1  --  6.8  --   --   --   --   ALBUMIN 3.9  --  3.1*  --   --   --   --   < > = values in this interval not displayed.  Recent Labs  05/03/15 1040  05/20/15 1341 05/21/15 0523 05/22/15 0533  02/06/16 02/20/16 03/22/16  WBC 9.6  < > 8.3 6.3 5.4  < > 5.8 5.9 5.4  NEUTROABS 6.8  --  5.9  --   --   --   --   --   --   HGB  --   < > 13.1 11.5* 11.9*  < > 10.1* 11.2* 12.9*  HCT 42.1  < > 39.0 34.6* 35.3*  < > 33* 37* 40*  MCV 93  --  90.9 90.1 90.1  --   --   --   --   PLT  --   < > 182 147* 126*  < > 192 194 143*  < > = values in this interval not displayed. Lab Results  Component Value Date   TSH 3.16  06/06/2015   Lab Results  Component Value Date   HGBA1C 6.7* 06/06/2015   Lab Results  Component Value Date   CHOL 132 02/07/2015   HDL 33* 02/07/2015   LDLCALC 76 02/07/2015   LDLDIRECT 85.6 07/19/2010   TRIG 115 02/07/2015   CHOLHDL 4 03/06/2011    Significant Diagnostic Results in last 30 days:  No results found.  Assessment/Plan Backache Continue Fentanyl 41mcg/hr, off Tylenol 1000mg     CAD (coronary artery disease) No angina since admitted to SNF. Prn NTG, statin, and off Xarelto   Chronic combined systolic and diastolic CHF (congestive heart failure) (HCC) BNP 1449.1 Noted increased edema, SOB, moist rales posterior mid to lower lungs Compensated clinically, improved, continue Furosemide 20mg , Spironolactone 25mg  daily. 02/09/16 BNP 1959.3.    Chronic respiratory failure Continue O2 [place]   CKD stage 3 due to type 1 diabetes mellitus (HCC) Baseline creat 1-1.5  Constipation Stable, continue 06/10/15 Lactulose prn. Colace bid   Depression Mood is stable, not on a mood stabilizer.    DM type 2 with diabetic peripheral neuropathy 06/06/15 Hgb A1c 6.7 diet control   Essential hypertension Controlled, continue Furosemide 20mg  and Spironolactone 25mg  daily.    GERD Stable, continue Omeprazole 20mg  daily,  Hgb  dropped from 11.4 12/12/15 to 7.6 01/02/16, resolved, last Hgb 10s, continue to be off ASA/Elliquis.     Iron deficiency anemia 02/06/16 Hgb 10.1, 02/20/16 Hgb 11.2, 03/22/16 Hgb 12.9, off Fe, continue off ASA/Elliquis.   Memory loss Progressing. off Namenda R>B. SNF for care needs. Comfort measures.    Paroxysmal atrial fibrillation (HCC) Rate is in control w/o medications. off Eliquis 2nd to Hgb 7s.      Family/ staff Communication: continue SNF for care needs.   Labs/tests ordered: none

## 2016-04-24 NOTE — Assessment & Plan Note (Signed)
Stable, continue Omeprazole 20mg  daily,  Hgb dropped from 11.4 12/12/15 to 7.6 01/02/16, resolved, last Hgb 10s, continue to be off ASA/Elliquis.

## 2016-04-24 NOTE — Assessment & Plan Note (Signed)
Continue O2 [place]

## 2016-04-26 DIAGNOSIS — N179 Acute kidney failure, unspecified: Secondary | ICD-10-CM | POA: Diagnosis not present

## 2016-04-30 ENCOUNTER — Ambulatory Visit (INDEPENDENT_AMBULATORY_CARE_PROVIDER_SITE_OTHER): Payer: Medicare Other | Admitting: *Deleted

## 2016-04-30 DIAGNOSIS — I442 Atrioventricular block, complete: Secondary | ICD-10-CM

## 2016-04-30 NOTE — Progress Notes (Signed)
Remote pacemaker transmission.   

## 2016-05-22 ENCOUNTER — Non-Acute Institutional Stay (SKILLED_NURSING_FACILITY): Payer: Medicare Other | Admitting: Nurse Practitioner

## 2016-05-22 ENCOUNTER — Encounter: Payer: Self-pay | Admitting: Nurse Practitioner

## 2016-05-22 DIAGNOSIS — R413 Other amnesia: Secondary | ICD-10-CM

## 2016-05-22 DIAGNOSIS — I5032 Chronic diastolic (congestive) heart failure: Secondary | ICD-10-CM | POA: Diagnosis not present

## 2016-05-22 DIAGNOSIS — J9610 Chronic respiratory failure, unspecified whether with hypoxia or hypercapnia: Secondary | ICD-10-CM

## 2016-05-22 DIAGNOSIS — E1022 Type 1 diabetes mellitus with diabetic chronic kidney disease: Secondary | ICD-10-CM | POA: Diagnosis not present

## 2016-05-22 DIAGNOSIS — I255 Ischemic cardiomyopathy: Secondary | ICD-10-CM | POA: Diagnosis not present

## 2016-05-22 DIAGNOSIS — I480 Paroxysmal atrial fibrillation: Secondary | ICD-10-CM

## 2016-05-22 DIAGNOSIS — I5042 Chronic combined systolic (congestive) and diastolic (congestive) heart failure: Secondary | ICD-10-CM

## 2016-05-22 DIAGNOSIS — K5900 Constipation, unspecified: Secondary | ICD-10-CM

## 2016-05-22 DIAGNOSIS — I2583 Coronary atherosclerosis due to lipid rich plaque: Secondary | ICD-10-CM

## 2016-05-22 DIAGNOSIS — E1142 Type 2 diabetes mellitus with diabetic polyneuropathy: Secondary | ICD-10-CM

## 2016-05-22 DIAGNOSIS — M4850XS Collapsed vertebra, not elsewhere classified, site unspecified, sequela of fracture: Secondary | ICD-10-CM | POA: Diagnosis not present

## 2016-05-22 DIAGNOSIS — N183 Chronic kidney disease, stage 3 (moderate): Secondary | ICD-10-CM

## 2016-05-22 DIAGNOSIS — R55 Syncope and collapse: Secondary | ICD-10-CM | POA: Diagnosis not present

## 2016-05-22 DIAGNOSIS — IMO0001 Reserved for inherently not codable concepts without codable children: Secondary | ICD-10-CM

## 2016-05-22 DIAGNOSIS — I10 Essential (primary) hypertension: Secondary | ICD-10-CM | POA: Diagnosis not present

## 2016-05-22 DIAGNOSIS — I2510 Atherosclerotic heart disease of native coronary artery without angina pectoris: Secondary | ICD-10-CM

## 2016-05-22 DIAGNOSIS — I739 Peripheral vascular disease, unspecified: Secondary | ICD-10-CM

## 2016-05-22 DIAGNOSIS — K219 Gastro-esophageal reflux disease without esophagitis: Secondary | ICD-10-CM | POA: Diagnosis not present

## 2016-05-22 NOTE — Assessment & Plan Note (Signed)
Stable, continue Omeprazole 20mg  daily,  Hgb dropped from 11.4 12/12/15 to 7.6 01/02/16, resolved, last Hgb 10s, continue to be off ASA/Elliquis.

## 2016-05-22 NOTE — Assessment & Plan Note (Signed)
Stable, no open wounds.  

## 2016-05-22 NOTE — Assessment & Plan Note (Signed)
Progressing. off Namenda R>B. SNF for care needs. Comfort measures.   

## 2016-05-22 NOTE — Assessment & Plan Note (Signed)
Continue O2 [place]

## 2016-05-22 NOTE — Progress Notes (Signed)
Patient ID: Zachary Harris, male   DOB: 1923/03/15, 82 y.o.   MRN: [id number]  Location:  [place] Room Number: N 9 Place of Service:  SNF (31) Provider: Lennie Odor Remijio Holleran NP  Estill Dooms, MD  Patient Care Team: Estill Dooms, MD as PCP - General (Internal Medicine) Irine Seal, MD as Consulting Physician (Urology) Renella Cunas, MD as Consulting Physician (Cardiology) [facility] Lelon Perla, MD as Consulting Physician (Cardiology) Malyn Aytes Rhunette Croft, NP as Nurse Practitioner (Nurse Practitioner) Rigoberto Noel, MD as Consulting Physician (Pulmonary Disease)  Extended Emergency Contact Information Primary Emergency Contact: Kathline Magic, Rosezella Rumpf of [place] Phone: [phone] Relation: Daughter Secondary Emergency Contact: Johnnette Gourd [place] of [place] Phone: [phone] Mobile Phone: [phone] Relation: Son  Code Status:  DNR Goals of care: Advanced Directive information Advanced Directives 05/22/2016  Does patient have an advance directive? Yes  Type of Paramedic of [place];Out of facility DNR (pink MOST or yellow form)  Does patient want to make changes to advanced directive? No - Patient declined  Copy of advanced directive(s) in chart? Yes     Chief Complaint  Patient presents with  . Medical Management of Chronic Issues    HPI:  Pt is a 82 y.o. male seen today for medical management of chronic diseases.  Hx of Afib, heart rate is in control, dementia, depression, stable,  HTN, controlled while on furosemide 20mg  and Spironolactone 25mg , anemia, Hgb 7.6, dropped from 11.4 12/22/15, repeated Hgb 7.3 01/09/16, no apparent bleeding noted, Eliquis/ASA held, fluid retention may contributory to the low Hgbs., improved to 10s after the patient is gently diuresed and ASA/Elliquis dc'd, on Fe 325mg  daily. CHF BNP 1449.1 01/02/16, the patient is apparently has increased edema,  moist rales posterior mid to lower lungs, SOB easily with exertion, no significant change since last seen, on Furosemide 20mg  and Spironolactone 25mg  daily.    Past Medical History  Diagnosis Date  . ANEMIA-IRON DEFICIENCY 05/27/2007  . ANXIETY 11/14/2007  . Intermittent high-grade heart block 09/05/2009    s/p pacemaker  . BENIGN PROSTATIC HYPERTROPHY 11/14/2007  . CAROTID ARTERY DISEASE 10/02/2010  . Pacemaker-St. Jude 02/02/2008    St. Jude  . COLONIC POLYPS, HX OF 05/27/2007  . CORONARY ARTERY DISEASE 11/14/2007    prior bypass [phone]; Inferior MI 11/07; Severe 3 vessel disease; occluded SVG to RCA; SVG to OM subtotalled, SVG to diagonal with ostial 70 and 80 mid; EF 45. PCI of SVG to OM. FU myovue showed EF 56 with inferior infarct and very mild peri-infarct ischemia  . DEPRESSION 11/14/2007  . DIABETES MELLITUS, TYPE II 11/14/2007  . DIVERTICULITIS, HX OF 05/27/2007  . DVT (deep venous thrombosis) ([place]) 03/21/2011  . FOOT PAIN, BILATERAL 12/08/2010  . GERD 11/14/2007  . HYPERLIPIDEMIA 11/14/2007  . HYPERTENSION 11/14/2007  . HYPOTENSION, ORTHOSTATIC 03/06/2011  . Memory loss 06/01/2008  . OSTEOPOROSIS 06/02/2010  . PERIPHERAL VASCULAR DISEASE 11/14/2007  . RENAL INSUFFICIENCY 11/14/2007  . SECONDARY DM W/PERIPHERAL CIRC D/O UNCONTROLLED 11/07/2010  . SYNCOPE 09/08/2008  . Unspecified hearing loss 05/13/2009  . Prostate cancer ([place])   . Myalgia and myositis, unspecified 04/24/2011  . Other malaise and fatigue 04/24/2011  . Peripheral neuropathy ([place]) 03/27/2011  . Seborrheic keratosis 06/05/2011  . Osteoarthrosis, unspecified whether generalized or localized, unspecified site   . Ischemic cardiomyopathy     a. echo (10/15):  mod focal basal septal hypertrophy, EF 40-45%, inf AK, Gr 1 DD, Al sclerosis, mild AI, MAC, mild MR, mod LAE, lipomatous hypertrophy, small effusion (no hemodynamic compromise)   . Effusion of left knee 03/17/2015  . Severe back pain   . DM (diabetes mellitus), type 2  with renal complications ([place]) [id number]  . Failure to thrive in adult 05/03/2015  . Chronic combined systolic and diastolic CHF (congestive heart failure) ([place]) 10/10/2014  . CKD stage 3 due to type 1 diabetes mellitus ([place]) 03/17/2015  . Dyspnea 11/09/2014  . Hearing loss 05/13/2009    Qualifier: Diagnosis of  By: Jenny Reichmann MD, Hunt Oris   . Hyperlipemia 11/14/2007    Qualifier: Diagnosis of  By: Jenny Reichmann MD, Hunt Oris    . Generalized weakness 05/20/2015  . Protein-calorie malnutrition, severe ([place]) 05/23/2015  . Vertebral compression fracture ([place]) 09/05/2009    Qualifier: Diagnosis of  By: Jenny Reichmann MD, Hunt Oris   . [name] 04/04/2015  . Weight loss 10/10/2014   Past Surgical History  Procedure Laterality Date  . S/p coronary stent x 1    . Cholecystectomy    . S/p bilat knee replacement  Cleon Gustin, MD  . Pacemaker insertion  2012    Caryl Comes, MD  . Prostate needle biopsy    . Cystoscopy    . Appendectomy    . Coronary artery bypass graft  2007    Allergies  Allergen Reactions  . Hydrocodone     hallucination  . Tramadol     hallucination      Medication List       This list is accurate as of: 05/22/16  4:48 PM.  Always use your most recent med list.               BOOST DIABETIC Liqd  Take 237 mLs by mouth 3 (three) times daily between meals.     docusate sodium 100 MG capsule  Commonly known as:  COLACE  Take 100 mg by mouth 2 (two) times daily.     feeding supplement (PRO-STAT SUGAR FREE 64) Liqd  Take 30 mLs by mouth 2 (two) times daily with a meal.     fentaNYL 25 MCG/HR patch  Commonly known as:  DURAGESIC  Apply fresh patch every 3 days to help pain. Remove old patch.     ferrous sulfate 325 (65 FE) MG tablet  Take 325 mg by mouth daily with breakfast.     furosemide 20 MG tablet  Commonly known as:  LASIX  Take 10 mg by mouth daily.     nitroGLYCERIN 0.4 MG SL tablet  Commonly known as:  NITROSTAT  Place 0.4 mg under the tongue every 5 (five) minutes as needed for  chest pain.     omeprazole 20 MG capsule  Commonly known as:  PRILOSEC  Take 20 mg by mouth daily.     spironolactone 25 MG tablet  Commonly known as:  ALDACTONE  Take 12.5 mg by mouth daily.     SYSTANE BALANCE OP  Place 1 drop into both eyes daily.        Review of Systems  Constitutional: Negative for fever and chills.  HENT: Positive for hearing loss. Negative for ear pain.   Eyes: Negative for photophobia and pain.       Corrective lenses  Respiratory: Positive for shortness of breath. Negative for cough and wheezing.        O2 dependent  Cardiovascular: Positive for leg  swelling.       Pacemaker. History of syncope related to cardiac arrhythmia. BLE edema trace R>L  Gastrointestinal: Negative for abdominal pain.  Endocrine: Negative for polydipsia.  Genitourinary: Positive for urgency and frequency. Negative for dysuria and flank pain.       Underwent Urology evaluation in the past, off meds. Episodes of incontinence persist.  Musculoskeletal: Positive for myalgias, back pain, gait problem and neck pain (Fall on 03/15/15 causing pain in the left knee and effusion.).       Poor balance and gait disturbance. Lower back pain Sever kyphosis  Skin:       Rough area on left cheek   Neurological: Negative for tremors, seizures and weakness.       Memory loss  Psychiatric/Behavioral:       Mild to moderate memory disturbance.    Immunization History  Administered Date(s) Administered  . Influenza Whole 09/23/2010, 09/23/2012  . Influenza,inj,Quad PF,36+ Mos 10/09/2014  . Influenza-Unspecified 09/07/2013, 09/10/2013, 09/30/2015  . PPD Test 05/23/2015  . Pneumococcal Polysaccharide-23 03/31/2009  . Td 03/31/2009   Pertinent  Health Maintenance Due  Topic Date Due  . OPHTHALMOLOGY EXAM  06/26/1933  . PNA vac Low Risk Adult (2 of 2 - PCV13) 03/31/2010  . URINE MICROALBUMIN  06/03/2011  . HEMOGLOBIN A1C  12/06/2015  . FOOT EXAM  03/16/2016  . INFLUENZA VACCINE   07/24/2016   Fall Risk  09/26/2015 05/26/2015 04/29/2015 04/04/2015 03/17/2015  Falls in the past year? Yes Yes Yes Yes Yes  Number falls in past yr: 1 1 1 1 2  or more  Injury with Fall? No Yes Yes Yes Yes  Risk Factor Category  - High Fall Risk - High Fall Risk High Fall Risk  Risk for fall due to : Impaired balance/gait History of fall(s);Impaired balance/gait;Impaired mobility - History of fall(s);Impaired balance/gait;Impaired mobility History of fall(s);Impaired balance/gait;Impaired mobility  Follow up Falls evaluation completed Falls evaluation completed - Falls evaluation completed Falls evaluation completed   Functional Status Survey:    Filed Vitals:   05/22/16 1520  BP: 120/70  Pulse: 76  Temp: 97.9 F (36.6 C)  TempSrc: Oral  Resp: 20  Height: 5\' 9"  (1.753 m)  Weight: 167 lb (75.751 kg)  SpO2: 99%   Body mass index is 24.65 kg/(m^2). Physical Exam  Constitutional: He is oriented to person, place, and time.  Frail  HENT:  Head: Normocephalic and atraumatic.  Right Ear: No drainage, swelling or tenderness. No foreign bodies. No mastoid tenderness. Tympanic membrane is not injected, not scarred, not perforated, not erythematous, not retracted and not bulging. Tympanic membrane mobility is normal. No hemotympanum. Decreased hearing is noted.  Left Ear: No drainage, swelling or tenderness. No foreign bodies. No mastoid tenderness. Tympanic membrane is not injected, not scarred, not perforated, not erythematous, not retracted and not bulging. Tympanic membrane mobility is normal. No hemotympanum. Decreased hearing is noted.  Nose: Nose normal.  Bilateral hearing aids.  Eyes: Conjunctivae are normal. Pupils are equal, round, and reactive to light.  Prescription lenses.  Neck: Normal range of motion. Neck supple. No JVD present. No thyromegaly present.  Cardiovascular: Normal rate and regular rhythm.  Exam reveals no gallop and no friction rub.   Murmur heard.  Systolic murmur is  present with a grade of 1/6  Absent DP and PT  Pulmonary/Chest: Effort normal. He has no wheezes. He has rales. He exhibits no tenderness.  Using portable oxygen at 2 L/m today. Moist rales posterior mid to lower  lungs.   Abdominal: Soft. Bowel sounds are normal. He exhibits no distension. There is no tenderness.  Musculoskeletal: Normal range of motion. He exhibits edema. He exhibits no tenderness.  Pain in the left SI joint area. Limps when walking. Favors the left side. Effusion of the left knee. Scars in the left knee from previous surgery. Tender at the left knee with movement of the patella. Positional lower back pain Server kyphosis RLE edema trace  Neurological: He is alert and oriented to person, place, and time. He has normal strength. A sensory deficit is present. No cranial nerve deficit. Gait abnormal.  07/13/14 MMSE 28/30. Passed clock drawing. Diminshed sensation to vabration and monofilament.  Skin: Skin is warm and dry. No erythema. No pallor.  Rough area on the left cheek. Cystic lesion in the mid forehead. Mid sternal surgical scar. Left upper chest pacemaker.     Psychiatric: He has a normal mood and affect. His behavior is normal. Thought content normal.  Patient is in denial regarding the extent of his memory deficit.    Labs reviewed:  Recent Labs  12/05/15 1055  02/09/16 03/06/16 1448 03/29/16  NA 139  < > 139 140 137  K 4.3  < > 4.2 4.4 4.5  CL 104  --   --  101  --   CO2 24  --   --  29  --   GLUCOSE 210*  --   --  98  --   BUN 38*  < > 50* 45* 43*  CREATININE 0.82  < > 1.3 1.15* 1.1  CALCIUM 9.2  --   --  9.6  --   < > = values in this interval not displayed.  Recent Labs  01/12/16 02/09/16 03/29/16  AST 18 25 19   ALT 10 15 12   ALKPHOS 104 210* 131*    Recent Labs  02/06/16 02/20/16 03/22/16  WBC 5.8 5.9 5.4  HGB 10.1* 11.2* 12.9*  HCT 33* 37* 40*  PLT 192 194 143*   Lab Results  Component Value Date   TSH 3.16 06/06/2015   Lab  Results  Component Value Date   HGBA1C 6.7* 06/06/2015   Lab Results  Component Value Date   CHOL 132 02/07/2015   HDL 33* 02/07/2015   LDLCALC 76 02/07/2015   LDLDIRECT 85.6 07/19/2010   TRIG 115 02/07/2015   CHOLHDL 4 03/06/2011    Significant Diagnostic Results in last 30 days:  No results found.  Assessment/Plan Essential hypertension Controlled, continue Furosemide 20mg  and Spironolactone 25mg  daily. 05/22/16 Na 137, K 4.0, Bun 57, creat 1.15, BNP 993.7     CAD (coronary artery disease) No angina since admitted to SNF. Prn NTG, statin, and off Xarelto  Peripheral vascular disease Stable, no open wounds.   Chronic combined systolic and diastolic CHF (congestive heart failure) ([place]) 01/13/16 Furosemide 20mg , Spironolactone 25mg .  02/09/16 BNP 1959.3 05/22/16 Na 137, K 4.0, Bun 57, creat 1.15, BNP 993.7     Paroxysmal atrial fibrillation (HCC) Rate is in control w/o medications. off Eliquis 2nd to Hgb 7s.     Chronic respiratory failure Continue O2 [place]  GERD Stable, continue Omeprazole 20mg  daily,  Hgb dropped from 11.4 12/12/15 to 7.6 01/02/16, resolved, last Hgb 10s, continue to be off ASA/Elliquis.      Constipation Stable, continue 06/10/15 Lactulose prn. Colace bid  CKD stage 3 due to type 1 diabetes mellitus (HCC) Baseline creat 1-1.5. 05/22/16 Na 137, K 4.0, Bun 57, creat 1.15, BNP 993.7  DM type 2 with diabetic peripheral neuropathy 06/06/15 Hgb A1c 6.7 diet control  Vertebral compression fracture Continue Fentanyl 47mcg/hr, off Tylenol 1000mg   Memory loss Progressing. off Namenda R>B. SNF for care needs. Comfort measures.       Family/ staff Communication: continue SNF for care needs.   Labs/tests ordered: none

## 2016-05-22 NOTE — Assessment & Plan Note (Signed)
Stable, continue 06/10/15 Lactulose prn. Colace bid.  

## 2016-05-22 NOTE — Assessment & Plan Note (Signed)
No angina since admitted to SNF. Prn NTG, statin, and off Xarelto 

## 2016-05-22 NOTE — Assessment & Plan Note (Addendum)
Baseline creat 1-1.5. 05/22/16 Na 137, K 4.0, Bun 57, creat 1.15, BNP 993.7

## 2016-05-22 NOTE — Assessment & Plan Note (Signed)
Rate is in control w/o medications. off Eliquis 2nd to Hgb 7s.  

## 2016-05-22 NOTE — Assessment & Plan Note (Signed)
06/06/15 Hgb A1c 6.7 diet control.   

## 2016-05-22 NOTE — Assessment & Plan Note (Signed)
01/13/16 Furosemide 20mg , Spironolactone 25mg .  02/09/16 BNP 1959.3 05/22/16 Na 137, K 4.0, Bun 57, creat 1.15, BNP 993.7

## 2016-05-22 NOTE — Assessment & Plan Note (Addendum)
Controlled, continue Furosemide 20mg  and Spironolactone 25mg  daily. 05/22/16 Na 137, K 4.0, Bun 57, creat 1.15, BNP 993.7

## 2016-05-22 NOTE — Assessment & Plan Note (Signed)
Continue Fentanyl 25mcg/hr, off Tylenol 1000mg 

## 2016-05-31 DIAGNOSIS — N390 Urinary tract infection, site not specified: Secondary | ICD-10-CM | POA: Diagnosis not present

## 2016-05-31 DIAGNOSIS — R627 Adult failure to thrive: Secondary | ICD-10-CM | POA: Diagnosis not present

## 2016-05-31 LAB — CBC AND DIFFERENTIAL
HCT: 42 % (ref 41–53)
HEMOGLOBIN: 14.2 g/dL (ref 13.5–17.5)
PLATELETS: 172 10*3/uL (ref 150–399)
WBC: 6.6 10*3/mL

## 2016-06-05 ENCOUNTER — Other Ambulatory Visit: Payer: Self-pay | Admitting: *Deleted

## 2016-06-06 ENCOUNTER — Encounter: Payer: Self-pay | Admitting: Cardiology

## 2016-06-06 LAB — CUP PACEART REMOTE DEVICE CHECK
Battery Remaining Longevity: 116 mo
Battery Remaining Percentage: 91 %
Brady Statistic RA Percent Paced: 2.7 %
Brady Statistic RV Percent Paced: 30 %
Date Time Interrogation Session: 20170508065623
Implantable Lead Implant Date: 20120326
Implantable Lead Location: 753860
Lead Channel Impedance Value: 440 Ohm
Lead Channel Pacing Threshold Amplitude: 0.75 V
Lead Channel Pacing Threshold Pulse Width: 0.4 ms
Lead Channel Sensing Intrinsic Amplitude: 3.2 mV
Lead Channel Setting Sensing Sensitivity: 2 mV
MDC IDC LEAD IMPLANT DT: 20120326
MDC IDC LEAD LOCATION: 753859
MDC IDC MSMT BATTERY VOLTAGE: 2.95 V
MDC IDC MSMT LEADCHNL RA PACING THRESHOLD AMPLITUDE: 0.75 V
MDC IDC MSMT LEADCHNL RV IMPEDANCE VALUE: 480 Ohm
MDC IDC MSMT LEADCHNL RV PACING THRESHOLD PULSEWIDTH: 0.4 ms
MDC IDC MSMT LEADCHNL RV SENSING INTR AMPL: 7.3 mV
MDC IDC PG SERIAL: 7228315
MDC IDC SET LEADCHNL RA PACING AMPLITUDE: 2 V
MDC IDC SET LEADCHNL RV PACING AMPLITUDE: 2.5 V
MDC IDC SET LEADCHNL RV PACING PULSEWIDTH: 0.4 ms
MDC IDC STAT BRADY AP VP PERCENT: 1 %
MDC IDC STAT BRADY AP VS PERCENT: 2.9 %
MDC IDC STAT BRADY AS VP PERCENT: 29 %
MDC IDC STAT BRADY AS VS PERCENT: 66 %

## 2016-07-17 ENCOUNTER — Non-Acute Institutional Stay (SKILLED_NURSING_FACILITY): Payer: Medicare Other | Admitting: Nurse Practitioner

## 2016-07-17 ENCOUNTER — Encounter: Payer: Self-pay | Admitting: Nurse Practitioner

## 2016-07-17 DIAGNOSIS — N183 Chronic kidney disease, stage 3 unspecified: Secondary | ICD-10-CM

## 2016-07-17 DIAGNOSIS — I739 Peripheral vascular disease, unspecified: Secondary | ICD-10-CM | POA: Diagnosis not present

## 2016-07-17 DIAGNOSIS — E1121 Type 2 diabetes mellitus with diabetic nephropathy: Secondary | ICD-10-CM | POA: Diagnosis not present

## 2016-07-17 DIAGNOSIS — F329 Major depressive disorder, single episode, unspecified: Secondary | ICD-10-CM

## 2016-07-17 DIAGNOSIS — R413 Other amnesia: Secondary | ICD-10-CM

## 2016-07-17 DIAGNOSIS — I480 Paroxysmal atrial fibrillation: Secondary | ICD-10-CM

## 2016-07-17 DIAGNOSIS — E1022 Type 1 diabetes mellitus with diabetic chronic kidney disease: Secondary | ICD-10-CM

## 2016-07-17 DIAGNOSIS — K5900 Constipation, unspecified: Secondary | ICD-10-CM | POA: Diagnosis not present

## 2016-07-17 DIAGNOSIS — I10 Essential (primary) hypertension: Secondary | ICD-10-CM

## 2016-07-17 DIAGNOSIS — I5042 Chronic combined systolic (congestive) and diastolic (congestive) heart failure: Secondary | ICD-10-CM | POA: Diagnosis not present

## 2016-07-17 DIAGNOSIS — F32A Depression, unspecified: Secondary | ICD-10-CM

## 2016-07-17 DIAGNOSIS — K219 Gastro-esophageal reflux disease without esophagitis: Secondary | ICD-10-CM

## 2016-07-17 DIAGNOSIS — T148 Other injury of unspecified body region: Secondary | ICD-10-CM | POA: Diagnosis not present

## 2016-07-17 DIAGNOSIS — IMO0002 Reserved for concepts with insufficient information to code with codable children: Secondary | ICD-10-CM

## 2016-07-17 NOTE — Assessment & Plan Note (Signed)
Rate is in control w/o medications. off Eliquis 2nd to Hgb 7s.  

## 2016-07-17 NOTE — Assessment & Plan Note (Signed)
Controlled, continue Furosemide 20mg  and Spironolactone 25mg  daily

## 2016-07-17 NOTE — Assessment & Plan Note (Signed)
Mood is stable, not on a mood stabilizer.

## 2016-07-17 NOTE — Progress Notes (Signed)
Location:   [facility] Room Number: N-9 Place of Service:  SNF (31) Provider:  Arbell Wycoff. Keela Rubert  Jeanmarie Hubert, MD  Patient Care Team: Estill Dooms, MD as PCP - General (Internal Medicine) Irine Seal, MD as Consulting Physician (Urology) Renella Cunas, MD as Consulting Physician (Cardiology) [facility] Lelon Perla, MD as Consulting Physician (Cardiology) Damauri Minion Otho Darner, NP as Nurse Practitioner (Nurse Practitioner) Rigoberto Noel, MD as Consulting Physician (Pulmonary Disease)  Extended Emergency Contact Information Primary Emergency Contact: Kathline Magic, Rosezella Rumpf of [place] Phone: [phone] Relation: Daughter Secondary Emergency Contact: Johnnette Gourd [place] of [place] Phone: [phone] Mobile Phone: [phone] Relation: Son  Code Status:  DNR Goals of care: Advanced Directive information Advanced Directives 07/17/2016  Does patient have an advance directive? Yes  Type of Paramedic of [place];Out of facility DNR (pink MOST or yellow form);Living will  Does patient want to make changes to advanced directive? -  Copy of advanced directive(s) in chart? -  Pre-existing out of facility DNR order (yellow form or pink MOST form) -     Chief Complaint  Patient presents with  . Medical Management of Chronic Issues    HPI:  Pt is a 82 y.o. male seen today for medical management of chronic diseases.  Hx of Afib, heart rate is in control, dementia, depression, stable,  HTN, controlled while on furosemide 20mg  and Spironolactone 25mg , anemia, Hgb 7.6, dropped from 11.4 12/22/15, repeated Hgb 7.3 01/09/16, no apparent bleeding noted, Eliquis/ASA held, fluid retention may contributory to the low Hgbs., improved to 10s after the patient is gently diuresed and ASA/Elliquis dc'd, on Fe 325mg  daily. CHF BNP 1449.1 01/02/16, the patient is apparently has increased edema, moist rales posterior  mid to lower lungs, SOB easily with exertion, no significant change since last seen, on Furosemide 20mg  and Spironolactone 25mg  daily.    Past Medical History:  Diagnosis Date  . ANEMIA-IRON DEFICIENCY 05/27/2007  . ANXIETY 11/14/2007  . BENIGN PROSTATIC HYPERTROPHY 11/14/2007  . CAROTID ARTERY DISEASE 10/02/2010  . Chronic combined systolic and diastolic CHF (congestive heart failure) ([place]) 10/10/2014  . CKD stage 3 due to type 1 diabetes mellitus ([place]) 03/17/2015  . COLONIC POLYPS, HX OF 05/27/2007  . CORONARY ARTERY DISEASE 11/14/2007   prior bypass [phone]; Inferior MI 11/07; Severe 3 vessel disease; occluded SVG to RCA; SVG to OM subtotalled, SVG to diagonal with ostial 70 and 80 mid; EF 45. PCI of SVG to OM. FU myovue showed EF 56 with inferior infarct and very mild peri-infarct ischemia  . DEPRESSION 11/14/2007  . DIABETES MELLITUS, TYPE II 11/14/2007  . DIVERTICULITIS, HX OF 05/27/2007  . DM (diabetes mellitus), type 2 with renal complications ([place]) [id number]  . DVT (deep venous thrombosis) ([place]) 03/21/2011  . Dyspnea 11/09/2014  . Effusion of left knee 03/17/2015  . Failure to thrive in adult 05/03/2015  . FOOT PAIN, BILATERAL 12/08/2010  . Generalized weakness 05/20/2015  . GERD 11/14/2007  . Hearing loss 05/13/2009   Qualifier: Diagnosis of  By: Jenny Reichmann MD, Hunt Oris   . Hyperlipemia 11/14/2007   Qualifier: Diagnosis of  By: Jenny Reichmann MD, Hunt Oris    . HYPERLIPIDEMIA 11/14/2007  . HYPERTENSION 11/14/2007  . HYPOTENSION, ORTHOSTATIC 03/06/2011  . Intermittent high-grade heart block 09/05/2009   s/p pacemaker  . Ischemic cardiomyopathy    a. echo (10/15):  mod focal basal septal  hypertrophy, EF 40-45%, inf AK, Gr 1 DD, Al sclerosis, mild AI, MAC, mild MR, mod LAE, lipomatous hypertrophy, small effusion (no hemodynamic compromise)   . Memory loss 06/01/2008  . Myalgia and myositis, unspecified 04/24/2011  . Osteoarthrosis, unspecified whether generalized or localized, unspecified site   .  OSTEOPOROSIS 06/02/2010  . Other malaise and fatigue 04/24/2011  . Pacemaker-St. Jude 02/02/2008   St. Jude  . Peripheral neuropathy ([place]) 03/27/2011  . PERIPHERAL VASCULAR DISEASE 11/14/2007  . Prostate cancer ([place])   . Protein-calorie malnutrition, severe ([place]) 05/23/2015  . RENAL INSUFFICIENCY 11/14/2007  . Seborrheic keratosis 06/05/2011  . SECONDARY DM W/PERIPHERAL CIRC D/O UNCONTROLLED 11/07/2010  . Severe back pain   . SYNCOPE 09/08/2008  . Unspecified hearing loss 05/13/2009  . Vertebral compression fracture ([place]) 09/05/2009   Qualifier: Diagnosis of  By: Jenny Reichmann MD, Hunt Oris   . [name] 04/04/2015  . Weight loss 10/10/2014   Past Surgical History:  Procedure Laterality Date  . APPENDECTOMY    . CHOLECYSTECTOMY    . CORONARY ARTERY BYPASS GRAFT  2007  . CYSTOSCOPY    . PACEMAKER INSERTION  2012   Caryl Comes, MD  . prostate needle biopsy    . s/p bilat knee replacement  1992   Paul, MD  . s/p coronary stent x 1      Allergies  Allergen Reactions  . Hydrocodone     hallucination  . Tramadol     hallucination      Medication List       Accurate as of 07/17/16  6:28 PM. Always use your most recent med list.          BOOST DIABETIC Liqd Take 237 mLs by mouth 3 (three) times daily between meals.   docusate sodium 100 MG capsule Commonly known as:  COLACE Take 100 mg by mouth 2 (two) times daily.   feeding supplement (PRO-STAT SUGAR FREE 64) Liqd Take 30 mLs by mouth 2 (two) times daily with a meal.   fentaNYL 25 MCG/HR patch Commonly known as:  DURAGESIC Apply fresh patch every 3 days to help pain. Remove old patch.   ferrous sulfate 325 (65 FE) MG tablet Take 325 mg by mouth daily with breakfast.   furosemide 20 MG tablet Commonly known as:  LASIX Take 10 mg by mouth daily.   nitroGLYCERIN 0.4 MG SL tablet Commonly known as:  NITROSTAT Place 0.4 mg under the tongue every 5 (five) minutes as needed for chest pain.   omeprazole 20 MG capsule Commonly known as:   PRILOSEC Take 20 mg by mouth daily.   OXYGEN Inhale 2 L/min into the lungs continuous.   spironolactone 25 MG tablet Commonly known as:  ALDACTONE Take 12.5 mg by mouth daily.       Review of Systems  Constitutional: Negative for chills and fever.  HENT: Positive for hearing loss. Negative for ear pain.   Eyes: Negative for photophobia and pain.       Corrective lenses  Respiratory: Positive for shortness of breath. Negative for cough and wheezing.        O2 dependent  Cardiovascular: Positive for leg swelling.       Pacemaker. History of syncope related to cardiac arrhythmia. BLE edema trace R>L  Gastrointestinal: Negative for abdominal pain.  Endocrine: Negative for polydipsia.  Genitourinary: Positive for frequency and urgency. Negative for dysuria and flank pain.       Underwent Urology evaluation in the past, off meds. Episodes of incontinence persist.  Musculoskeletal: Positive for back pain, gait problem, myalgias and neck pain (Fall on 03/15/15 causing pain in the left knee and effusion.).       Poor balance and gait disturbance. Lower back pain Sever kyphosis  Skin:       Rough area on left cheek   Neurological: Negative for tremors, seizures and weakness.       Memory loss  Psychiatric/Behavioral:       Mild to moderate memory disturbance.    Immunization History  Administered Date(s) Administered  . Influenza Whole 09/23/2010, 09/23/2012  . Influenza,inj,Quad PF,36+ Mos 10/09/2014  . Influenza-Unspecified 09/07/2013, 09/10/2013, 09/30/2015  . PPD Test 05/23/2015  . Pneumococcal Polysaccharide-23 03/31/2009  . Td 03/31/2009   Pertinent  Health Maintenance Due  Topic Date Due  . OPHTHALMOLOGY EXAM  06/26/1933  . PNA vac Low Risk Adult (2 of 2 - PCV13) 03/31/2010  . URINE MICROALBUMIN  06/03/2011  . HEMOGLOBIN A1C  12/06/2015  . FOOT EXAM  03/16/2016  . INFLUENZA VACCINE  07/24/2016   Fall Risk  09/26/2015 05/26/2015 04/29/2015 04/04/2015 03/17/2015  Falls  in the past year? Yes Yes Yes Yes Yes  Number falls in past yr: 1 1 1 1 2  or more  Injury with Fall? No Yes Yes Yes Yes  Risk Factor Category  - High Fall Risk - High Fall Risk High Fall Risk  Risk for fall due to : Impaired balance/gait History of fall(s);Impaired balance/gait;Impaired mobility - History of fall(s);Impaired balance/gait;Impaired mobility History of fall(s);Impaired balance/gait;Impaired mobility  Follow up Falls evaluation completed Falls evaluation completed - Falls evaluation completed Falls evaluation completed   Functional Status Survey:    Vitals:   07/17/16 1402  BP: 120/60  Pulse: 72  Resp: 20  Temp: 99 F (37.2 C)  SpO2: 96%  Weight: 156 lb (70.8 kg)  Height: 5\' 9"  (1.753 m)   Body mass index is 23.04 kg/m. Physical Exam  Constitutional: He is oriented to person, place, and time.  Frail  HENT:  Head: Normocephalic and atraumatic.  Right Ear: No drainage, swelling or tenderness. No foreign bodies. No mastoid tenderness. Tympanic membrane is not injected, not scarred, not perforated, not erythematous, not retracted and not bulging. Tympanic membrane mobility is normal. No hemotympanum. Decreased hearing is noted.  Left Ear: No drainage, swelling or tenderness. No foreign bodies. No mastoid tenderness. Tympanic membrane is not injected, not scarred, not perforated, not erythematous, not retracted and not bulging. Tympanic membrane mobility is normal. No hemotympanum. Decreased hearing is noted.  Nose: Nose normal.  Bilateral hearing aids.  Eyes: Conjunctivae are normal. Pupils are equal, round, and reactive to light.  Prescription lenses.  Neck: Normal range of motion. Neck supple. No JVD present. No thyromegaly present.  Cardiovascular: Normal rate and regular rhythm.  Exam reveals no gallop and no friction rub.   Murmur heard.  Systolic murmur is present with a grade of 1/6  Absent DP and PT  Pulmonary/Chest: Effort normal. He has no wheezes. He has  rales. He exhibits no tenderness.  Using portable oxygen at 2 L/m today. Moist rales posterior mid to lower lungs.   Abdominal: Soft. Bowel sounds are normal. He exhibits no distension. There is no tenderness.  Musculoskeletal: Normal range of motion. He exhibits edema. He exhibits no tenderness.  Pain in the left SI joint area. Limps when walking. Favors the left side. Effusion of the left knee. Scars in the left knee from previous surgery. Tender at the left knee with movement of  the patella. Positional lower back pain Server kyphosis RLE edema trace  Neurological: He is alert and oriented to person, place, and time. He has normal strength. A sensory deficit is present. No cranial nerve deficit. Gait abnormal.  07/13/14 MMSE 28/30. Passed clock drawing. Diminshed sensation to vabration and monofilament.  Skin: Skin is warm and dry. No erythema. No pallor.  Rough area on the left cheek. Cystic lesion in the mid forehead. Mid sternal surgical scar. Left upper chest pacemaker.     Psychiatric: He has a normal mood and affect. His behavior is normal. Thought content normal.  Patient is in denial regarding the extent of his memory deficit.    Labs reviewed:  Recent Labs  12/05/15 1055  02/09/16 03/06/16 1448 03/29/16  NA 139  < > 139 140 137  K 4.3  < > 4.2 4.4 4.5  CL 104  --   --  101  --   CO2 24  --   --  29  --   GLUCOSE 210*  --   --  98  --   BUN 38*  < > 50* 45* 43*  CREATININE 0.82  < > 1.3 1.15* 1.1  CALCIUM 9.2  --   --  9.6  --   < > = values in this interval not displayed.  Recent Labs  01/12/16 02/09/16 03/29/16  AST 18 25 19   ALT 10 15 12   ALKPHOS 104 210* 131*    Recent Labs  02/20/16 03/22/16 05/31/16  WBC 5.9 5.4 6.6  HGB 11.2* 12.9* 14.2  HCT 37* 40* 42  PLT 194 143* 172   Lab Results  Component Value Date   TSH 3.16 06/06/2015   Lab Results  Component Value Date   HGBA1C 6.7 (A) 06/06/2015   Lab Results  Component Value Date   CHOL 132  02/07/2015   HDL 33 (A) 02/07/2015   LDLCALC 76 02/07/2015   LDLDIRECT 85.6 07/19/2010   TRIG 115 02/07/2015   CHOLHDL 4 03/06/2011    Significant Diagnostic Results in last 30 days:  No results found.  Assessment/Plan There are no diagnoses linked to this encounter.Essential hypertension Controlled, continue Furosemide 20mg  and Spironolactone 25mg  daily  Peripheral vascular disease Stable, no open wounds.   Chronic combined systolic and diastolic CHF (congestive heart failure) ([place]) 01/13/16 Furosemide 20mg , Spironolactone 25mg .  02/09/16 BNP 1959.3 05/22/16 Na 137, K 4.0, Bun 57, creat 1.15, BNP 993.7  Paroxysmal atrial fibrillation (HCC) Rate is in control w/o medications. off Eliquis 2nd to Hgb 7s.      GERD Stable, continue Omeprazole 20mg  daily,  Hgb dropped from 11.4 12/12/15 to 7.6 01/02/16, resolved, last Hgb 12.8 04/26/16, continue to be off ASA/Elliquis.   Constipation Stable, continue 06/10/15 Lactulose prn. Colace bid  CKD stage 3 due to type 1 diabetes mellitus (HCC) Baseline creat 1-1.5. 05/22/16 Na 137, K 4.0, Bun 57, creat 1.15, BNP 993.7     DM (diabetes mellitus), type 2 with renal complications (HCC) [id number] Hgb A1c 6.7 diet control  Compression fracture Continue Fentanyl 15mcg/hr, off Tylenol 1000mg   Depression Mood is stable, not on a mood stabilizer.     Memory loss Progressing. off Namenda R>B. SNF for care needs. Comfort measures.         Family/ staff Communication: continue SNF for care needs.   Labs/tests ordered:  none

## 2016-07-17 NOTE — Assessment & Plan Note (Signed)
06/06/15 Hgb A1c 6.7 diet control.   

## 2016-07-17 NOTE — Assessment & Plan Note (Signed)
Baseline creat 1-1.5. 05/22/16 Na 137, K 4.0, Bun 57, creat 1.15, BNP 993.7

## 2016-07-17 NOTE — Assessment & Plan Note (Signed)
Stable, continue Omeprazole 20mg  daily,  Hgb dropped from 11.4 12/12/15 to 7.6 01/02/16, resolved, last Hgb 12.8 04/26/16, continue to be off ASA/Elliquis.

## 2016-07-17 NOTE — Assessment & Plan Note (Signed)
Progressing. off Namenda R>B. SNF for care needs. Comfort measures.   

## 2016-07-17 NOTE — Assessment & Plan Note (Signed)
Stable, continue 06/10/15 Lactulose prn. Colace bid.  

## 2016-07-17 NOTE — Assessment & Plan Note (Signed)
01/13/16 Furosemide 20mg , Spironolactone 25mg .  02/09/16 BNP 1959.3 05/22/16 Na 137, K 4.0, Bun 57, creat 1.15, BNP 993.7

## 2016-07-17 NOTE — Assessment & Plan Note (Signed)
Continue Fentanyl 71mcg/hr, off Tylenol 1000mg 

## 2016-07-17 NOTE — Assessment & Plan Note (Signed)
Stable, no open wounds.  

## 2016-07-24 ENCOUNTER — Encounter: Payer: Self-pay | Admitting: Nurse Practitioner

## 2016-07-24 ENCOUNTER — Non-Acute Institutional Stay (SKILLED_NURSING_FACILITY): Payer: Medicare Other | Admitting: Nurse Practitioner

## 2016-07-24 DIAGNOSIS — E1022 Type 1 diabetes mellitus with diabetic chronic kidney disease: Secondary | ICD-10-CM

## 2016-07-24 DIAGNOSIS — F329 Major depressive disorder, single episode, unspecified: Secondary | ICD-10-CM

## 2016-07-24 DIAGNOSIS — T148 Other injury of unspecified body region: Secondary | ICD-10-CM

## 2016-07-24 DIAGNOSIS — I480 Paroxysmal atrial fibrillation: Secondary | ICD-10-CM | POA: Diagnosis not present

## 2016-07-24 DIAGNOSIS — N183 Chronic kidney disease, stage 3 unspecified: Secondary | ICD-10-CM

## 2016-07-24 DIAGNOSIS — E1121 Type 2 diabetes mellitus with diabetic nephropathy: Secondary | ICD-10-CM

## 2016-07-24 DIAGNOSIS — I739 Peripheral vascular disease, unspecified: Secondary | ICD-10-CM

## 2016-07-24 DIAGNOSIS — K219 Gastro-esophageal reflux disease without esophagitis: Secondary | ICD-10-CM | POA: Diagnosis not present

## 2016-07-24 DIAGNOSIS — T148XXA Other injury of unspecified body region, initial encounter: Secondary | ICD-10-CM

## 2016-07-24 DIAGNOSIS — I5042 Chronic combined systolic (congestive) and diastolic (congestive) heart failure: Secondary | ICD-10-CM

## 2016-07-24 DIAGNOSIS — IMO0001 Reserved for inherently not codable concepts without codable children: Secondary | ICD-10-CM

## 2016-07-24 DIAGNOSIS — F32A Depression, unspecified: Secondary | ICD-10-CM

## 2016-07-24 DIAGNOSIS — I10 Essential (primary) hypertension: Secondary | ICD-10-CM | POA: Diagnosis not present

## 2016-07-24 DIAGNOSIS — K5900 Constipation, unspecified: Secondary | ICD-10-CM | POA: Diagnosis not present

## 2016-07-24 DIAGNOSIS — M4850XS Collapsed vertebra, not elsewhere classified, site unspecified, sequela of fracture: Secondary | ICD-10-CM

## 2016-07-24 DIAGNOSIS — R413 Other amnesia: Secondary | ICD-10-CM

## 2016-07-24 DIAGNOSIS — J9610 Chronic respiratory failure, unspecified whether with hypoxia or hypercapnia: Secondary | ICD-10-CM | POA: Diagnosis not present

## 2016-07-24 NOTE — Assessment & Plan Note (Signed)
06/06/15 Hgb A1c 6.7 diet control.   

## 2016-07-24 NOTE — Assessment & Plan Note (Signed)
Stable, continue Omeprazole 20mg  daily,  Hgb dropped from 11.4 12/12/15 to 7.6 01/02/16, resolved, last Hgb 12.8 04/26/16, continue to be off ASA/Elliquis.

## 2016-07-24 NOTE — Assessment & Plan Note (Signed)
Continue O2 per [place]

## 2016-07-24 NOTE — Assessment & Plan Note (Signed)
Rate is in control w/o medications. off Eliquis 2nd to Hgb 7s.  

## 2016-07-24 NOTE — Assessment & Plan Note (Signed)
Baseline creat 1-1.5. 05/22/16 Na 137, K 4.0, Bun 57, creat 1.15, BNP 993.7

## 2016-07-24 NOTE — Assessment & Plan Note (Signed)
01/13/16 Furosemide 20mg , Spironolactone 25mg .  02/09/16 BNP 1959.3 05/22/16 Na 137, K 4.0, Bun 57, creat 1.15, BNP 993.7 Compensating clinically.

## 2016-07-24 NOTE — Assessment & Plan Note (Signed)
Mood is stable, not on a mood stabilizer.

## 2016-07-24 NOTE — Assessment & Plan Note (Signed)
Progressing. off Namenda R>B. SNF for care needs. Comfort measures.   

## 2016-07-24 NOTE — Progress Notes (Signed)
Location:   [facility] Room Number: N 9 Place of Service:  SNF (31) Provider:  Gaynelle Pastrana  NP   Patient Care Team: Estill Dooms, MD as PCP - General (Internal Medicine) Irine Seal, MD as Consulting Physician (Urology) Renella Cunas, MD (Inactive) as Consulting Physician (Cardiology) [facility] Lelon Perla, MD as Consulting Physician (Cardiology) Harless Molinari Otho Darner, NP as Nurse Practitioner (Nurse Practitioner) Rigoberto Noel, MD as Consulting Physician (Pulmonary Disease)  Extended Emergency Contact Information Primary Emergency Contact: Kathline Magic, Rosezella Rumpf of [place] Phone: [phone] Relation: Daughter Secondary Emergency Contact: Johnnette Gourd [place] of [place] Phone: [phone] Mobile Phone: [phone] Relation: Son  Code Status:  DNR Goals of care: Advanced Directive information Advanced Directives 07/24/2016  Does patient have an advance directive? Yes  Type of Paramedic of [place];Out of facility DNR (pink MOST or yellow form)  Does patient want to make changes to advanced directive? No - Patient declined  Copy of advanced directive(s) in chart? Yes  Pre-existing out of facility DNR order (yellow form or pink MOST form) -     Chief Complaint  Patient presents with  . Acute Visit    skin tear to right arm    HPI:  Pt is a 82 y.o. male seen today for an acute visit for multiple skin tears RUE, the patient has no recollection of the event due to his dementia, no s/s of infection.    Hx of Afib, heart rate is in control, dementia, depression, stable, HTN, controlled while on furosemide 20mg  and Spironolactone 25mg , anemia, Hgb 7.6, dropped from 11.4 12/22/15, repeated Hgb 7.3 01/09/16, no apparent bleeding noted, Eliquis/ASA held, fluid retention may contributory to the low Hgbs., improved to 10s after the patient is gently diuresed and ASA/Elliquis dc'd, on Fe  325mg  daily. CHF BNP 1449.1 01/02/16, the patient is apparently has increased edema, moist rales posterior mid to lower lungs, SOB easily with exertion, no significant change since last seen, on Furosemide 20mg  and Spironolactone 25mg  daily.      Past Medical History:  Diagnosis Date  . ANEMIA-IRON DEFICIENCY 05/27/2007  . ANXIETY 11/14/2007  . BENIGN PROSTATIC HYPERTROPHY 11/14/2007  . CAROTID ARTERY DISEASE 10/02/2010  . Chronic combined systolic and diastolic CHF (congestive heart failure) ([place]) 10/10/2014  . CKD stage 3 due to type 1 diabetes mellitus ([place]) 03/17/2015  . COLONIC POLYPS, HX OF 05/27/2007  . CORONARY ARTERY DISEASE 11/14/2007   prior bypass [phone]; Inferior MI 11/07; Severe 3 vessel disease; occluded SVG to RCA; SVG to OM subtotalled, SVG to diagonal with ostial 70 and 80 mid; EF 45. PCI of SVG to OM. FU myovue showed EF 56 with inferior infarct and very mild peri-infarct ischemia  . DEPRESSION 11/14/2007  . DIABETES MELLITUS, TYPE II 11/14/2007  . DIVERTICULITIS, HX OF 05/27/2007  . DM (diabetes mellitus), type 2 with renal complications ([place]) [id number]  . DVT (deep venous thrombosis) ([place]) 03/21/2011  . Dyspnea 11/09/2014  . Effusion of left knee 03/17/2015  . Failure to thrive in adult 05/03/2015  . FOOT PAIN, BILATERAL 12/08/2010  . Generalized weakness 05/20/2015  . GERD 11/14/2007  . Hearing loss 05/13/2009   Qualifier: Diagnosis of  By: Jenny Reichmann MD, Hunt Oris   . Hyperlipemia 11/14/2007   Qualifier: Diagnosis of  By: Jenny Reichmann MD, Hunt Oris    . HYPERLIPIDEMIA 11/14/2007  . HYPERTENSION 11/14/2007  .  HYPOTENSION, ORTHOSTATIC 03/06/2011  . Intermittent high-grade heart block 09/05/2009   s/p pacemaker  . Ischemic cardiomyopathy    a. echo (10/15):  mod focal basal septal hypertrophy, EF 40-45%, inf AK, Gr 1 DD, Al sclerosis, mild AI, MAC, mild MR, mod LAE, lipomatous hypertrophy, small effusion (no hemodynamic compromise)   . Memory loss 06/01/2008  . Myalgia and myositis,  unspecified 04/24/2011  . Osteoarthrosis, unspecified whether generalized or localized, unspecified site   . OSTEOPOROSIS 06/02/2010  . Other malaise and fatigue 04/24/2011  . Pacemaker-St. Jude 02/02/2008   St. Jude  . Peripheral neuropathy ([place]) 03/27/2011  . PERIPHERAL VASCULAR DISEASE 11/14/2007  . Prostate cancer ([place])   . Protein-calorie malnutrition, severe ([place]) 05/23/2015  . RENAL INSUFFICIENCY 11/14/2007  . Seborrheic keratosis 06/05/2011  . SECONDARY DM W/PERIPHERAL CIRC D/O UNCONTROLLED 11/07/2010  . Severe back pain   . SYNCOPE 09/08/2008  . Unspecified hearing loss 05/13/2009  . Vertebral compression fracture ([place]) 09/05/2009   Qualifier: Diagnosis of  By: Jenny Reichmann MD, Hunt Oris   . [name] 04/04/2015  . Weight loss 10/10/2014   Past Surgical History:  Procedure Laterality Date  . APPENDECTOMY    . CHOLECYSTECTOMY    . CORONARY ARTERY BYPASS GRAFT  2007  . CYSTOSCOPY    . PACEMAKER INSERTION  2012   Caryl Comes, MD  . prostate needle biopsy    . s/p bilat knee replacement  1992   Paul, MD  . s/p coronary stent x 1      Allergies  Allergen Reactions  . Hydrocodone     hallucination  . Tramadol     hallucination      Medication List       Accurate as of 07/24/16  4:33 PM. Always use your most recent med list.          BOOST DIABETIC Liqd Take 237 mLs by mouth 3 (three) times daily between meals.   docusate sodium 100 MG capsule Commonly known as:  COLACE Take 100 mg by mouth 2 (two) times daily.   famotidine 20 MG tablet Commonly known as:  PEPCID Take 20 mg by mouth at bedtime. For GERD   feeding supplement (PRO-STAT SUGAR FREE 64) Liqd Take 30 mLs by mouth 2 (two) times daily with a meal.   fentaNYL 25 MCG/HR patch Commonly known as:  DURAGESIC Apply fresh patch every 3 days to help pain. Remove old patch.   furosemide 20 MG tablet Commonly known as:  LASIX Take 10 mg by mouth daily.   nitroGLYCERIN 0.4 MG SL tablet Commonly known as:  NITROSTAT Place 0.4 mg  under the tongue every 5 (five) minutes as needed for chest pain.   OXYGEN Inhale 2 L/min into the lungs continuous.   spironolactone 25 MG tablet Commonly known as:  ALDACTONE Take 12.5 mg by mouth daily.       Review of Systems  Constitutional: Negative for chills and fever.  HENT: Positive for hearing loss. Negative for ear pain.   Eyes: Negative for photophobia and pain.       Corrective lenses  Respiratory: Positive for shortness of breath. Negative for cough and wheezing.        O2 dependent  Cardiovascular: Positive for leg swelling.       Pacemaker. History of syncope related to cardiac arrhythmia. BLE edema trace R>L  Gastrointestinal: Negative for abdominal pain.  Endocrine: Negative for polydipsia.  Genitourinary: Positive for frequency and urgency. Negative for dysuria and flank pain.  Underwent Urology evaluation in the past, off meds. Episodes of incontinence persist.  Musculoskeletal: Positive for back pain, gait problem, myalgias and neck pain (Fall on 03/15/15 causing pain in the left knee and effusion.).       Poor balance and gait disturbance. Lower back pain Sever kyphosis  Skin:       Rough area on left cheek. Multiple skin tears RUE, not infected.    Neurological: Negative for tremors, seizures and weakness.       Memory loss  Psychiatric/Behavioral:       Mild to moderate memory disturbance.    Immunization History  Administered Date(s) Administered  . Influenza Whole 09/23/2010, 09/23/2012  . Influenza,inj,Quad PF,36+ Mos 10/09/2014  . Influenza-Unspecified 09/07/2013, 09/10/2013, 09/30/2015  . PPD Test 05/23/2015  . Pneumococcal Polysaccharide-23 03/31/2009  . Td 03/31/2009   Pertinent  Health Maintenance Due  Topic Date Due  . OPHTHALMOLOGY EXAM  06/26/1933  . PNA vac Low Risk Adult (2 of 2 - PCV13) 03/31/2010  . URINE MICROALBUMIN  06/03/2011  . HEMOGLOBIN A1C  12/06/2015  . FOOT EXAM  03/16/2016  . INFLUENZA VACCINE  07/24/2016    Fall Risk  09/26/2015 05/26/2015 04/29/2015 04/04/2015 03/17/2015  Falls in the past year? Yes Yes Yes Yes Yes  Number falls in past yr: 1 1 1 1 2  or more  Injury with Fall? No Yes Yes Yes Yes  Risk Factor Category  - High Fall Risk - High Fall Risk High Fall Risk  Risk for fall due to : Impaired balance/gait History of fall(s);Impaired balance/gait;Impaired mobility - History of fall(s);Impaired balance/gait;Impaired mobility History of fall(s);Impaired balance/gait;Impaired mobility  Follow up Falls evaluation completed Falls evaluation completed - Falls evaluation completed Falls evaluation completed   Functional Status Survey:    Vitals:   07/24/16 1324  BP: 130/70  Pulse: 70  Resp: 20  Temp: 97.7 F (36.5 C)  SpO2: 93%  Weight: 156 lb (70.8 kg)  Height: 5\' 9"  (1.753 m)   Body mass index is 23.04 kg/m. Physical Exam  Constitutional: He is oriented to person, place, and time.  Frail  HENT:  Head: Normocephalic and atraumatic.  Right Ear: No drainage, swelling or tenderness. No foreign bodies. No mastoid tenderness. Tympanic membrane is not injected, not scarred, not perforated, not erythematous, not retracted and not bulging. Tympanic membrane mobility is normal. No hemotympanum. Decreased hearing is noted.  Left Ear: No drainage, swelling or tenderness. No foreign bodies. No mastoid tenderness. Tympanic membrane is not injected, not scarred, not perforated, not erythematous, not retracted and not bulging. Tympanic membrane mobility is normal. No hemotympanum. Decreased hearing is noted.  Nose: Nose normal.  Bilateral hearing aids.  Eyes: Conjunctivae are normal. Pupils are equal, round, and reactive to light.  Prescription lenses.  Neck: Normal range of motion. Neck supple. No JVD present. No thyromegaly present.  Cardiovascular: Normal rate and regular rhythm.  Exam reveals no gallop and no friction rub.   Murmur heard.  Systolic murmur is present with a grade of 1/6  Absent  DP and PT  Pulmonary/Chest: Effort normal. He has no wheezes. He has rales. He exhibits no tenderness.  Using portable oxygen at 2 L/m today. Moist rales posterior mid to lower lungs.   Abdominal: Soft. Bowel sounds are normal. He exhibits no distension. There is no tenderness.  Musculoskeletal: Normal range of motion. He exhibits edema. He exhibits no tenderness.  Pain in the left SI joint area. Limps when walking. Favors the left side. Effusion  of the left knee. Scars in the left knee from previous surgery. Tender at the left knee with movement of the patella. Positional lower back pain Server kyphosis RLE edema trace  Neurological: He is alert and oriented to person, place, and time. He has normal strength. A sensory deficit is present. No cranial nerve deficit. Gait abnormal.  07/13/14 MMSE 28/30. Passed clock drawing. Diminshed sensation to vabration and monofilament.  Skin: Skin is warm and dry. No erythema. No pallor.  Rough area on the left cheek. Cystic lesion in the mid forehead. Mid sternal surgical scar. Left upper chest pacemaker. Multiple small skin tears RUE, no s/s of infection.      Psychiatric: He has a normal mood and affect. His behavior is normal. Thought content normal.  Patient is in denial regarding the extent of his memory deficit.    Labs reviewed:  Recent Labs  12/05/15 1055  02/09/16 03/06/16 1448 03/29/16  NA 139  < > 139 140 137  K 4.3  < > 4.2 4.4 4.5  CL 104  --   --  101  --   CO2 24  --   --  29  --   GLUCOSE 210*  --   --  98  --   BUN 38*  < > 50* 45* 43*  CREATININE 0.82  < > 1.3 1.15* 1.1  CALCIUM 9.2  --   --  9.6  --   < > = values in this interval not displayed.  Recent Labs  01/12/16 02/09/16 03/29/16  AST 18 25 19   ALT 10 15 12   ALKPHOS 104 210* 131*    Recent Labs  02/20/16 03/22/16 05/31/16  WBC 5.9 5.4 6.6  HGB 11.2* 12.9* 14.2  HCT 37* 40* 42  PLT 194 143* 172   Lab Results  Component Value Date   TSH 3.16 06/06/2015    Lab Results  Component Value Date   HGBA1C 6.7 (A) 06/06/2015   Lab Results  Component Value Date   CHOL 132 02/07/2015   HDL 33 (A) 02/07/2015   LDLCALC 76 02/07/2015   LDLDIRECT 85.6 07/19/2010   TRIG 115 02/07/2015   CHOLHDL 4 03/06/2011    Significant Diagnostic Results in last 30 days:  No results found.  Assessment/Plan There are no diagnoses linked to this encounter.Essential hypertension Controlled, continue Furosemide 20mg  and Spironolactone 25mg  daily   Peripheral vascular disease Stable, chronic mild venous dermatitis BLE   Chronic combined systolic and diastolic CHF (congestive heart failure) (H[facility]) 01/13/16 Furosemide 20mg , Spironolactone 25mg .  02/09/16 BNP 1959.3 05/22/16 Na 137, K 4.0, Bun 57, creat 1.15, BNP 993.7 Compensating clinically.   Paroxysmal atrial fibrillation (HCC) Rate is in control w/o medications. off Eliquis 2nd to Hgb 7s.   Chronic respiratory failure Continue O2 per [place]  GERD Stable, continue Omeprazole 20mg  daily,  Hgb dropped from 11.4 12/12/15 to 7.6 01/02/16, resolved, last Hgb 12.8 04/26/16, continue to be off ASA/Elliquis.    Constipation Stable, continue 06/10/15 Lactulose prn. Colace bid   CKD stage 3 due to type 1 diabetes mellitus (HCC) Baseline creat 1-1.5. 05/22/16 Na 137, K 4.0, Bun 57, creat 1.15, BNP 993.7  DM (diabetes mellitus), type 2 with renal complications (HCC) [id number] Hgb A1c 6.7 diet control  Vertebral compression fracture Pain is well controlled. Continue Fentanyl 49mcg/hr, off Tylenol 1000mg   Depression Mood is stable, not on a mood stabilizer.     Memory loss Progressing. off Namenda R>B. SNF for care needs. Comfort measures.  Multiple skin tears RUE, no s/s of infection, should heal, monitor     Family/ staff Communication: continue SNF for care assistance  Labs/tests ordered:  none

## 2016-07-24 NOTE — Assessment & Plan Note (Signed)
Controlled, continue Furosemide 20mg  and Spironolactone 25mg  daily

## 2016-07-24 NOTE — Assessment & Plan Note (Signed)
Pain is well controlled. Continue Fentanyl 77mcg/hr, off Tylenol 1000mg 

## 2016-07-24 NOTE — Assessment & Plan Note (Signed)
Stable, chronic mild venous dermatitis BLE

## 2016-07-24 NOTE — Assessment & Plan Note (Signed)
Stable, continue 06/10/15 Lactulose prn. Colace bid.  

## 2016-07-24 NOTE — Assessment & Plan Note (Signed)
RUE, no s/s of infection, should heal, monitor

## 2016-07-30 ENCOUNTER — Ambulatory Visit (INDEPENDENT_AMBULATORY_CARE_PROVIDER_SITE_OTHER): Payer: Medicare Other | Admitting: *Deleted

## 2016-07-30 DIAGNOSIS — I442 Atrioventricular block, complete: Secondary | ICD-10-CM | POA: Diagnosis not present

## 2016-07-30 NOTE — Progress Notes (Signed)
Remote pacemaker transmission.   

## 2016-08-01 ENCOUNTER — Encounter: Payer: Self-pay | Admitting: Cardiology

## 2016-08-03 LAB — CUP PACEART REMOTE DEVICE CHECK
Battery Remaining Percentage: 91 %
Battery Voltage: 2.95 V
Brady Statistic AP VP Percent: 1.4 %
Brady Statistic AS VS Percent: 59 %
Brady Statistic RA Percent Paced: 2.8 %
Brady Statistic RV Percent Paced: 36 %
Date Time Interrogation Session: 20170807072546
Implantable Lead Implant Date: 20120326
Implantable Lead Implant Date: 20120326
Lead Channel Impedance Value: 480 Ohm
Lead Channel Pacing Threshold Amplitude: 0.75 V
Lead Channel Pacing Threshold Pulse Width: 0.4 ms
Lead Channel Pacing Threshold Pulse Width: 0.4 ms
Lead Channel Sensing Intrinsic Amplitude: 2.9 mV
Lead Channel Setting Sensing Sensitivity: 2 mV
MDC IDC LEAD LOCATION: 753859
MDC IDC LEAD LOCATION: 753860
MDC IDC MSMT BATTERY REMAINING LONGEVITY: 115 mo
MDC IDC MSMT LEADCHNL RA IMPEDANCE VALUE: 410 Ohm
MDC IDC MSMT LEADCHNL RV PACING THRESHOLD AMPLITUDE: 0.75 V
MDC IDC MSMT LEADCHNL RV SENSING INTR AMPL: 5.7 mV
MDC IDC PG SERIAL: 7228315
MDC IDC SET LEADCHNL RA PACING AMPLITUDE: 2 V
MDC IDC SET LEADCHNL RV PACING AMPLITUDE: 2.5 V
MDC IDC SET LEADCHNL RV PACING PULSEWIDTH: 0.4 ms
MDC IDC STAT BRADY AP VS PERCENT: 3 %
MDC IDC STAT BRADY AS VP PERCENT: 34 %

## 2016-08-23 ENCOUNTER — Non-Acute Institutional Stay (SKILLED_NURSING_FACILITY): Payer: Medicare Other | Admitting: Nurse Practitioner

## 2016-08-23 ENCOUNTER — Encounter: Payer: Self-pay | Admitting: *Deleted

## 2016-08-23 DIAGNOSIS — I5042 Chronic combined systolic (congestive) and diastolic (congestive) heart failure: Secondary | ICD-10-CM | POA: Diagnosis not present

## 2016-08-23 DIAGNOSIS — D509 Iron deficiency anemia, unspecified: Secondary | ICD-10-CM

## 2016-08-23 DIAGNOSIS — I10 Essential (primary) hypertension: Secondary | ICD-10-CM

## 2016-08-23 DIAGNOSIS — M549 Dorsalgia, unspecified: Secondary | ICD-10-CM

## 2016-08-23 DIAGNOSIS — R413 Other amnesia: Secondary | ICD-10-CM

## 2016-08-23 DIAGNOSIS — E1142 Type 2 diabetes mellitus with diabetic polyneuropathy: Secondary | ICD-10-CM

## 2016-08-23 DIAGNOSIS — I480 Paroxysmal atrial fibrillation: Secondary | ICD-10-CM | POA: Diagnosis not present

## 2016-08-23 DIAGNOSIS — F329 Major depressive disorder, single episode, unspecified: Secondary | ICD-10-CM

## 2016-08-23 DIAGNOSIS — K2100 Gastro-esophageal reflux disease with esophagitis, without bleeding: Secondary | ICD-10-CM

## 2016-08-23 DIAGNOSIS — R627 Adult failure to thrive: Secondary | ICD-10-CM

## 2016-08-23 DIAGNOSIS — J9610 Chronic respiratory failure, unspecified whether with hypoxia or hypercapnia: Secondary | ICD-10-CM | POA: Diagnosis not present

## 2016-08-23 DIAGNOSIS — K5900 Constipation, unspecified: Secondary | ICD-10-CM

## 2016-08-23 DIAGNOSIS — F32A Depression, unspecified: Secondary | ICD-10-CM

## 2016-08-23 NOTE — Assessment & Plan Note (Signed)
06/06/15 Hgb A1c 6.7 diet control.   

## 2016-08-23 NOTE — Assessment & Plan Note (Signed)
Stable, continue 06/10/15 Lactulose prn. Colace bid.  

## 2016-08-23 NOTE — Assessment & Plan Note (Signed)
Controlled, continue Furosemide 20mg  and Spironolactone 25mg  daily

## 2016-08-23 NOTE — Assessment & Plan Note (Signed)
05/31/16 Hgb 14.2, off Iron

## 2016-08-23 NOTE — Assessment & Plan Note (Signed)
Controlled, continue Fentanyl transdermal patch  

## 2016-08-23 NOTE — Assessment & Plan Note (Signed)
Progressing. off Namenda R>B. SNF for care needs. Comfort measures.   

## 2016-08-23 NOTE — Progress Notes (Signed)
Location:   [facility] Room Number: 9 Place of Service:  SNF (31) Provider:  Mast, Manxie  NP  Jeanmarie Hubert, MD  Patient Care Team: Estill Dooms, MD as PCP - General (Internal Medicine) Irine Seal, MD as Consulting Physician (Urology) Renella Cunas, MD (Inactive) as Consulting Physician (Cardiology) [facility] Lelon Perla, MD as Consulting Physician (Cardiology) Man Otho Darner, NP as Nurse Practitioner (Nurse Practitioner) Rigoberto Noel, MD as Consulting Physician (Pulmonary Disease)  Extended Emergency Contact Information Primary Emergency Contact: Kathline Magic, Rosezella Rumpf of [place] Phone: [phone] Relation: Daughter Secondary Emergency Contact: Johnnette Gourd [place] of [place] Phone: [phone] Mobile Phone: [phone] Relation: Son  Code Status:  DNR Goals of care: Advanced Directive information Advanced Directives 08/23/2016  Does patient have an advance directive? Yes  Type of Paramedic of [place];Out of facility DNR (pink MOST or yellow form)  Does patient want to make changes to advanced directive? No - Patient declined  Copy of advanced directive(s) in chart? Yes  Pre-existing out of facility DNR order (yellow form or pink MOST form) -     Chief Complaint  Patient presents with  . Medical Management of Chronic Issues    HPI:  Pt is a 82 y.o. male seen today for medical management of chronic diseases.    Hx of Afib, heart rate is in control, dementia, depression, stable, HTN, controlled while on furosemide 20mg  and Spironolactone 25mg , anemia, Hgb 7.6, dropped from 11.4 12/22/15, repeated Hgb 7.3 01/09/16, improved,  05/31/16 Hgb 14.2, off Iron,  ASA/Elliquis dc'd,  CHF BNP 1449.17, compensated  on Furosemide 20mg  and Spironolactone 25mg  daily.   Past Medical History:  Diagnosis Date  . ANEMIA-IRON DEFICIENCY 05/27/2007  . ANXIETY 11/14/2007  . BENIGN  PROSTATIC HYPERTROPHY 11/14/2007  . CAROTID ARTERY DISEASE 10/02/2010  . Chronic combined systolic and diastolic CHF (congestive heart failure) ([place]) 10/10/2014  . CKD stage 3 due to type 1 diabetes mellitus ([place]) 03/17/2015  . COLONIC POLYPS, HX OF 05/27/2007  . CORONARY ARTERY DISEASE 11/14/2007   prior bypass [phone]; Inferior MI 11/07; Severe 3 vessel disease; occluded SVG to RCA; SVG to OM subtotalled, SVG to diagonal with ostial 70 and 80 mid; EF 45. PCI of SVG to OM. FU myovue showed EF 56 with inferior infarct and very mild peri-infarct ischemia  . DEPRESSION 11/14/2007  . DIABETES MELLITUS, TYPE II 11/14/2007  . DIVERTICULITIS, HX OF 05/27/2007  . DM (diabetes mellitus), type 2 with renal complications ([place]) [id number]  . DVT (deep venous thrombosis) ([place]) 03/21/2011  . Dyspnea 11/09/2014  . Effusion of left knee 03/17/2015  . Failure to thrive in adult 05/03/2015  . FOOT PAIN, BILATERAL 12/08/2010  . Generalized weakness 05/20/2015  . GERD 11/14/2007  . Hearing loss 05/13/2009   Qualifier: Diagnosis of  By: Jenny Reichmann MD, Hunt Oris   . Hyperlipemia 11/14/2007   Qualifier: Diagnosis of  By: Jenny Reichmann MD, Hunt Oris    . HYPERLIPIDEMIA 11/14/2007  . HYPERTENSION 11/14/2007  . HYPOTENSION, ORTHOSTATIC 03/06/2011  . Intermittent high-grade heart block 09/05/2009   s/p pacemaker  . Ischemic cardiomyopathy    a. echo (10/15):  mod focal basal septal hypertrophy, EF 40-45%, inf AK, Gr 1 DD, Al sclerosis, mild AI, MAC, mild MR, mod LAE, lipomatous hypertrophy, small effusion (no hemodynamic compromise)   . Memory loss 06/01/2008  . Myalgia and myositis, unspecified 04/24/2011  .  Osteoarthrosis, unspecified whether generalized or localized, unspecified site   . OSTEOPOROSIS 06/02/2010  . Other malaise and fatigue 04/24/2011  . Pacemaker-St. Jude 02/02/2008   St. Jude  . Peripheral neuropathy ([place]) 03/27/2011  . PERIPHERAL VASCULAR DISEASE 11/14/2007  . Prostate cancer ([place])   . Protein-calorie malnutrition,  severe ([place]) 05/23/2015  . RENAL INSUFFICIENCY 11/14/2007  . Seborrheic keratosis 06/05/2011  . SECONDARY DM W/PERIPHERAL CIRC D/O UNCONTROLLED 11/07/2010  . Severe back pain   . SYNCOPE 09/08/2008  . Unspecified hearing loss 05/13/2009  . Vertebral compression fracture ([place]) 09/05/2009   Qualifier: Diagnosis of  By: Jenny Reichmann MD, Hunt Oris   . [name] 04/04/2015  . Weight loss 10/10/2014   Past Surgical History:  Procedure Laterality Date  . APPENDECTOMY    . CHOLECYSTECTOMY    . CORONARY ARTERY BYPASS GRAFT  2007  . CYSTOSCOPY    . PACEMAKER INSERTION  2012   Caryl Comes, MD  . prostate needle biopsy    . s/p bilat knee replacement  1992   Paul, MD  . s/p coronary stent x 1      Allergies  Allergen Reactions  . Hydrocodone     hallucination  . Tramadol     hallucination      Medication List       Accurate as of 08/23/16  4:41 PM. Always use your most recent med list.          BOOST DIABETIC Liqd Take 237 mLs by mouth 3 (three) times daily between meals.   docusate sodium 100 MG capsule Commonly known as:  COLACE Take 100 mg by mouth 2 (two) times daily.   famotidine 20 MG tablet Commonly known as:  PEPCID Take 20 mg by mouth at bedtime. For GERD   feeding supplement (PRO-STAT SUGAR FREE 64) Liqd Take 30 mLs by mouth 2 (two) times daily with a meal.   fentaNYL 25 MCG/HR patch Commonly known as:  DURAGESIC Apply fresh patch every 3 days to help pain. Remove old patch.   furosemide 20 MG tablet Commonly known as:  LASIX Take 10 mg by mouth daily.   nitroGLYCERIN 0.4 MG SL tablet Commonly known as:  NITROSTAT Place 0.4 mg under the tongue every 5 (five) minutes as needed for chest pain.   OXYGEN Inhale 2 L/min into the lungs continuous.   spironolactone 25 MG tablet Commonly known as:  ALDACTONE Take 12.5 mg by mouth daily.       Review of Systems  Constitutional: Negative for chills and fever.  HENT: Positive for hearing loss. Negative for ear pain.   Eyes:  Negative for photophobia and pain.       Corrective lenses  Respiratory: Positive for shortness of breath. Negative for cough and wheezing.        O2 dependent  Cardiovascular: Positive for leg swelling.       Pacemaker. History of syncope related to cardiac arrhythmia. BLE edema trace R>L  Gastrointestinal: Negative for abdominal pain.  Endocrine: Negative for polydipsia.  Genitourinary: Positive for frequency and urgency. Negative for dysuria and flank pain.       Underwent Urology evaluation in the past, off meds. Episodes of incontinence persist.  Musculoskeletal: Positive for back pain, gait problem, myalgias and neck pain (Fall on 03/15/15 causing pain in the left knee and effusion.).       Poor balance and gait disturbance. Lower back pain Sever kyphosis  Skin:       Rough area on left cheek. Multiple  skin tears RUE, not infected.    Neurological: Negative for tremors, seizures and weakness.       Memory loss  Psychiatric/Behavioral:       Mild to moderate memory disturbance.    Immunization History  Administered Date(s) Administered  . Influenza Whole 09/23/2010, 09/23/2012  . Influenza,inj,Quad PF,36+ Mos 10/09/2014  . Influenza-Unspecified 09/07/2013, 09/10/2013, 09/30/2015  . PPD Test 05/23/2015  . Pneumococcal Polysaccharide-23 03/31/2009  . Td 03/31/2009   Pertinent  Health Maintenance Due  Topic Date Due  . OPHTHALMOLOGY EXAM  06/26/1933  . PNA vac Low Risk Adult (2 of 2 - PCV13) 03/31/2010  . URINE MICROALBUMIN  06/03/2011  . HEMOGLOBIN A1C  12/06/2015  . FOOT EXAM  03/16/2016  . INFLUENZA VACCINE  07/24/2016   Fall Risk  09/26/2015 05/26/2015 04/29/2015 04/04/2015 03/17/2015  Falls in the past year? Yes Yes Yes Yes Yes  Number falls in past yr: 1 1 1 1 2  or more  Injury with Fall? No Yes Yes Yes Yes  Risk Factor Category  - High Fall Risk - High Fall Risk High Fall Risk  Risk for fall due to : Impaired balance/gait History of fall(s);Impaired  balance/gait;Impaired mobility - History of fall(s);Impaired balance/gait;Impaired mobility History of fall(s);Impaired balance/gait;Impaired mobility  Follow up Falls evaluation completed Falls evaluation completed - Falls evaluation completed Falls evaluation completed   Functional Status Survey:    Vitals:   08/23/16 1234  BP: 118/60  Pulse: 72  Resp: 20  Temp: 97.9 F (36.6 C)  SpO2: 92%  Weight: 153 lb (69.4 kg)  Height: 5\' 9"  (1.753 m)   Body mass index is 22.59 kg/m. Physical Exam  Constitutional: He is oriented to person, place, and time.  Frail  HENT:  Head: Normocephalic and atraumatic.  Right Ear: No drainage, swelling or tenderness. No foreign bodies. No mastoid tenderness. Tympanic membrane is not injected, not scarred, not perforated, not erythematous, not retracted and not bulging. Tympanic membrane mobility is normal. No hemotympanum. Decreased hearing is noted.  Left Ear: No drainage, swelling or tenderness. No foreign bodies. No mastoid tenderness. Tympanic membrane is not injected, not scarred, not perforated, not erythematous, not retracted and not bulging. Tympanic membrane mobility is normal. No hemotympanum. Decreased hearing is noted.  Nose: Nose normal.  Bilateral hearing aids.  Eyes: Conjunctivae are normal. Pupils are equal, round, and reactive to light.  Prescription lenses.  Neck: Normal range of motion. Neck supple. No JVD present. No thyromegaly present.  Cardiovascular: Normal rate and regular rhythm.  Exam reveals no gallop and no friction rub.   Murmur heard.  Systolic murmur is present with a grade of 1/6  Absent DP and PT  Pulmonary/Chest: Effort normal. He has no wheezes. He has rales. He exhibits no tenderness.  Using portable oxygen at 2 L/m today. Moist rales posterior mid to lower lungs.   Abdominal: Soft. Bowel sounds are normal. He exhibits no distension. There is no tenderness.  Musculoskeletal: Normal range of motion. He exhibits  edema. He exhibits no tenderness.  Pain in the left SI joint area. Limps when walking. Favors the left side. Effusion of the left knee. Scars in the left knee from previous surgery. Tender at the left knee with movement of the patella. Positional lower back pain Server kyphosis RLE edema trace  Neurological: He is alert and oriented to person, place, and time. He has normal strength. A sensory deficit is present. No cranial nerve deficit. Gait abnormal.  07/13/14 MMSE 28/30. Passed clock drawing.  Diminshed sensation to vabration and monofilament.  Skin: Skin is warm and dry. No erythema. No pallor.  Rough area on the left cheek. Cystic lesion in the mid forehead. Mid sternal surgical scar. Left upper chest pacemaker. Multiple small skin tears RUE, no s/s of infection.      Psychiatric: He has a normal mood and affect. His behavior is normal. Thought content normal.  Patient is in denial regarding the extent of his memory deficit.    Labs reviewed:  Recent Labs  12/05/15 1055  02/09/16 03/06/16 1448 03/29/16  NA 139  < > 139 140 137  K 4.3  < > 4.2 4.4 4.5  CL 104  --   --  101  --   CO2 24  --   --  29  --   GLUCOSE 210*  --   --  98  --   BUN 38*  < > 50* 45* 43*  CREATININE 0.82  < > 1.3 1.15* 1.1  CALCIUM 9.2  --   --  9.6  --   < > = values in this interval not displayed.  Recent Labs  01/12/16 02/09/16 03/29/16  AST 18 25 19   ALT 10 15 12   ALKPHOS 104 210* 131*    Recent Labs  02/20/16 03/22/16 05/31/16  WBC 5.9 5.4 6.6  HGB 11.2* 12.9* 14.2  HCT 37* 40* 42  PLT 194 143* 172   Lab Results  Component Value Date   TSH 3.16 06/06/2015   Lab Results  Component Value Date   HGBA1C 6.7 (A) 06/06/2015   Lab Results  Component Value Date   CHOL 132 02/07/2015   HDL 33 (A) 02/07/2015   LDLCALC 76 02/07/2015   LDLDIRECT 85.6 07/19/2010   TRIG 115 02/07/2015   CHOLHDL 4 03/06/2011    Significant Diagnostic Results in last 30 days:  No results  found.  Assessment/Plan There are no diagnoses linked to this encounter.Iron deficiency anemia 05/31/16 Hgb 14.2, off Iron  Failure to thrive in adult Stabilized.   Memory loss Progressing. off [place] R>B. SNF for care needs. Comfort measures.    Backache Controlled, continue Fentanyl transdermal patch  Depression Mood is stable, not on a mood stabilizer.     DM type 2 with diabetic peripheral neuropathy 06/06/15 Hgb A1c 6.7 diet control  Constipation Stable, continue 06/10/15 Lactulose prn. Colace bid  GERD Stable, continue Omeprazole 20mg  daily,  Hgb dropped from 11.4 12/12/15 to 7.6 01/02/16, resolved, last Hgb 14.3 05/31/16, continue to be off ASA/Elliquis/Fe   Chronic respiratory failure Continue O2 per [place]  Paroxysmal atrial fibrillation (HCC) Rate is in control w/o medications. off Eliquis 2nd to Hgb 7s.   Chronic combined systolic and diastolic CHF (congestive heart failure) ([place]) 01/13/16 Furosemide 20mg , Spironolactone 25mg .  02/09/16 BNP 1959.3 05/22/16 Na 137, K 4.0, Bun 57, creat 1.15, BNP 993.7 Compensating clinically.    Essential hypertension Controlled, continue Furosemide 20mg  and Spironolactone 25mg  daily       Family/ staff Communication: continue SNF for care assistance.   Labs/tests ordered:  none

## 2016-08-23 NOTE — Assessment & Plan Note (Signed)
Stabilized.  

## 2016-08-23 NOTE — Assessment & Plan Note (Signed)
01/13/16 Furosemide 20mg , Spironolactone 25mg .  02/09/16 BNP 1959.3 05/22/16 Na 137, K 4.0, Bun 57, creat 1.15, BNP 993.7 Compensating clinically.

## 2016-08-23 NOTE — Assessment & Plan Note (Signed)
Mood is stable, not on a mood stabilizer.

## 2016-08-23 NOTE — Assessment & Plan Note (Signed)
Continue O2 per [place]

## 2016-08-23 NOTE — Assessment & Plan Note (Signed)
Rate is in control w/o medications. off Eliquis 2nd to Hgb 7s.  

## 2016-08-23 NOTE — Assessment & Plan Note (Signed)
Stable, continue Omeprazole 20mg  daily,  Hgb dropped from 11.4 12/12/15 to 7.6 01/02/16, resolved, last Hgb 14.3 05/31/16, continue to be off ASA/Elliquis/Fe

## 2016-09-27 ENCOUNTER — Non-Acute Institutional Stay (SKILLED_NURSING_FACILITY): Payer: Medicare Other | Admitting: Internal Medicine

## 2016-09-27 ENCOUNTER — Encounter: Payer: Self-pay | Admitting: Internal Medicine

## 2016-09-27 DIAGNOSIS — E1022 Type 1 diabetes mellitus with diabetic chronic kidney disease: Secondary | ICD-10-CM

## 2016-09-27 DIAGNOSIS — D509 Iron deficiency anemia, unspecified: Secondary | ICD-10-CM

## 2016-09-27 DIAGNOSIS — J9610 Chronic respiratory failure, unspecified whether with hypoxia or hypercapnia: Secondary | ICD-10-CM

## 2016-09-27 DIAGNOSIS — R413 Other amnesia: Secondary | ICD-10-CM

## 2016-09-27 DIAGNOSIS — R627 Adult failure to thrive: Secondary | ICD-10-CM | POA: Diagnosis not present

## 2016-09-27 DIAGNOSIS — E1121 Type 2 diabetes mellitus with diabetic nephropathy: Secondary | ICD-10-CM

## 2016-09-27 DIAGNOSIS — N183 Chronic kidney disease, stage 3 (moderate): Secondary | ICD-10-CM | POA: Diagnosis not present

## 2016-09-27 DIAGNOSIS — I5042 Chronic combined systolic (congestive) and diastolic (congestive) heart failure: Secondary | ICD-10-CM

## 2016-09-27 DIAGNOSIS — I10 Essential (primary) hypertension: Secondary | ICD-10-CM | POA: Diagnosis not present

## 2016-09-27 NOTE — Progress Notes (Signed)
Progress Note    Location:  [facility] Room Number: N09 Place of Service:  SNF [id number]) Provider:  Jeanmarie Hubert, MD  Patient Care Team: Estill Dooms, MD as PCP - General (Internal Medicine) Irine Seal, MD as Consulting Physician (Urology) Renella Cunas, MD (Inactive) as Consulting Physician (Cardiology) [facility] Lelon Perla, MD as Consulting Physician (Cardiology) Man Otho Darner, NP as Nurse Practitioner (Nurse Practitioner) Rigoberto Noel, MD as Consulting Physician (Pulmonary Disease)  Extended Emergency Contact Information Primary Emergency Contact: Kathline Magic, Rosezella Rumpf of [place] Phone: [phone] Relation: Daughter Secondary Emergency Contact: Johnnette Gourd [place] of [place] Phone: [phone] Mobile Phone: [phone] Relation: Son  Code Status:  DNR Goals of care: Advanced Directive information Advanced Directives 09/27/2016  Does patient have an advance directive? Yes  Type of Paramedic of [place];Living will;Out of facility DNR (pink MOST or yellow form)  Does patient want to make changes to advanced directive? -  Copy of advanced directive(s) in chart? Yes  Pre-existing out of facility DNR order (yellow form or pink MOST form) Pink MOST form placed in chart (order not valid for inpatient use);Yellow form placed in chart (order not valid for inpatient use)     Chief Complaint  Patient presents with  . Medical Management of Chronic Issues    routine    HPI:  Pt is a 82 y.o. male seen today for medical management of chronic diseases.    Chronic combined systolic and diastolic CHF (congestive heart failure) ([place])  - compensatred. Continues on O2, but may be improved enough to attempt weaning.  CKD stage 3 due to type 1 diabetes mellitus (HCC) - stable  Type 2 diabetes mellitus with diabetic nephropathy, without long-term current use of insulin ([place]) -  controlled  Essential hypertension - adequately controlled  Failure to thrive in adult - continues to lose weight. Oral intake variable. He denies nausea or loss of appetite  Iron deficiency anemia, unspecified iron deficiency anemia type - resolved in June 2017  Memory loss - gradually worsening  Chronic respiratory failure, unspecified whether with hypoxia or hypercapnia (HCC) - breathing easily. Will try weaning from O2.     Past Medical History:  Diagnosis Date  . ANEMIA-IRON DEFICIENCY 05/27/2007  . ANXIETY 11/14/2007  . BENIGN PROSTATIC HYPERTROPHY 11/14/2007  . CAROTID ARTERY DISEASE 10/02/2010  . Chronic combined systolic and diastolic CHF (congestive heart failure) ([place]) 10/10/2014  . CKD stage 3 due to type 1 diabetes mellitus ([place]) 03/17/2015  . COLONIC POLYPS, HX OF 05/27/2007  . CORONARY ARTERY DISEASE 11/14/2007   prior bypass [phone]; Inferior MI 11/07; Severe 3 vessel disease; occluded SVG to RCA; SVG to OM subtotalled, SVG to diagonal with ostial 70 and 80 mid; EF 45. PCI of SVG to OM. FU myovue showed EF 56 with inferior infarct and very mild peri-infarct ischemia  . DEPRESSION 11/14/2007  . DIABETES MELLITUS, TYPE II 11/14/2007  . DIVERTICULITIS, HX OF 05/27/2007  . DM (diabetes mellitus), type 2 with renal complications ([place]) [id number]  . DVT (deep venous thrombosis) ([place]) 03/21/2011  . Dyspnea 11/09/2014  . Effusion of left knee 03/17/2015  . Failure to thrive in adult 05/03/2015  . FOOT PAIN, BILATERAL 12/08/2010  . Generalized weakness 05/20/2015  . GERD 11/14/2007  . Hearing loss 05/13/2009   Qualifier: Diagnosis of  By: Jenny Reichmann MD, Hunt Oris   .  Hyperlipemia 11/14/2007   Qualifier: Diagnosis of  By: Jenny Reichmann MD, Hunt Oris    . HYPERLIPIDEMIA 11/14/2007  . HYPERTENSION 11/14/2007  . HYPOTENSION, ORTHOSTATIC 03/06/2011  . Intermittent high-grade heart block 09/05/2009   s/p pacemaker  . Ischemic cardiomyopathy    a. echo (10/15):  mod focal basal septal hypertrophy,  EF 40-45%, inf AK, Gr 1 DD, Al sclerosis, mild AI, MAC, mild MR, mod LAE, lipomatous hypertrophy, small effusion (no hemodynamic compromise)   . Memory loss 06/01/2008  . Myalgia and myositis, unspecified 04/24/2011  . Osteoarthrosis, unspecified whether generalized or localized, unspecified site   . OSTEOPOROSIS 06/02/2010  . Other malaise and fatigue 04/24/2011  . Pacemaker-St. Jude 02/02/2008   St. Jude  . Peripheral neuropathy ([place]) 03/27/2011  . PERIPHERAL VASCULAR DISEASE 11/14/2007  . Prostate cancer ([place])   . Protein-calorie malnutrition, severe ([place]) 05/23/2015  . RENAL INSUFFICIENCY 11/14/2007  . Seborrheic keratosis 06/05/2011  . SECONDARY DM W/PERIPHERAL CIRC D/O UNCONTROLLED 11/07/2010  . Severe back pain   . SYNCOPE 09/08/2008  . Unspecified hearing loss 05/13/2009  . Vertebral compression fracture ([place]) 09/05/2009   Qualifier: Diagnosis of  By: Jenny Reichmann MD, Hunt Oris   . Zachary Harris 04/04/2015  . Weight loss 10/10/2014   Past Surgical History:  Procedure Laterality Date  . APPENDECTOMY    . CHOLECYSTECTOMY    . CORONARY ARTERY BYPASS GRAFT  2007  . CYSTOSCOPY    . PACEMAKER INSERTION  2012   Caryl Comes, MD  . prostate needle biopsy    . s/p bilat knee replacement  1992   Paul, MD  . s/p coronary stent x 1      Allergies  Allergen Reactions  . Hydrocodone     hallucination  . Tramadol     hallucination      Medication List       Accurate as of 09/27/16 11:40 AM. Always use your most recent med list.          BOOST DIABETIC Liqd Take 237 mLs by mouth 3 (three) times daily between meals.   docusate sodium 100 MG capsule Commonly known as:  COLACE Take 100 mg by mouth 2 (two) times daily.   famotidine 20 MG tablet Commonly known as:  PEPCID Take 20 mg by mouth at bedtime. For GERD   feeding supplement (PRO-STAT SUGAR FREE 64) Liqd Take 30 mLs by mouth 2 (two) times daily with a meal.   fentaNYL 25 MCG/HR patch Commonly known as:  DURAGESIC Apply fresh patch every 3 days  to help pain. Remove old patch.   furosemide 20 MG tablet Commonly known as:  LASIX Take 10 mg by mouth daily.   nitroGLYCERIN 0.4 MG SL tablet Commonly known as:  NITROSTAT Place 0.4 mg under the tongue every 5 (five) minutes as needed for chest pain.   OXYGEN Inhale 2 L/min into the lungs continuous.   spironolactone 25 MG tablet Commonly known as:  ALDACTONE Take 12.5 mg by mouth daily.   SYSTANE BALANCE 0.6 % Soln Generic drug:  Propylene Glycol Apply to eye. One drop both eyes 2 times a day due to dry eyes   zinc oxide 20 % ointment Apply 1 application topically. Apply ointment as needed to buttocks       Review of Systems  Constitutional: Negative for chills and fever.  HENT: Positive for hearing loss. Negative for ear pain.   Eyes: Negative for photophobia and pain.       Corrective lenses  Respiratory: Positive for  shortness of breath. Negative for cough and wheezing.        Chronic respiratory failure on O2.  Cardiovascular: Positive for leg swelling.       Pacemaker. History of syncope related to cardiac arrhythmia. BLE edema trace R>L  Gastrointestinal: Negative for abdominal pain.  Endocrine: Negative for polydipsia.  Genitourinary: Positive for frequency and urgency. Negative for dysuria and flank pain.       Underwent Urology evaluation in the past, off meds. Episodes of incontinence persist.  Musculoskeletal: Positive for back pain, gait problem, myalgias and neck pain (Fall on 03/15/15 causing pain in the left knee and effusion.).       Poor balance and gait disturbance. Lower back pain Sever kyphosis  Skin:       Sebaceous cyst of the forehead.  Neurological: Negative for tremors, seizures and weakness.       Memory loss  Psychiatric/Behavioral: Positive for confusion and decreased concentration.       Moderate memory disturbance.    Immunization History  Administered Date(s) Administered  . Influenza Whole 09/23/2010, 09/23/2012  .  Influenza,inj,Quad PF,36+ Mos 10/09/2014  . Influenza-Unspecified 09/07/2013, 09/10/2013, 09/30/2015  . PPD Test 05/23/2015  . Pneumococcal Polysaccharide-23 03/31/2009  . Td 03/31/2009   Pertinent  Health Maintenance Due  Topic Date Due  . OPHTHALMOLOGY EXAM  06/26/1933  . PNA vac Low Risk Adult (2 of 2 - PCV13) 03/31/2010  . URINE MICROALBUMIN  06/03/2011  . HEMOGLOBIN A1C  12/06/2015  . FOOT EXAM  03/16/2016  . INFLUENZA VACCINE  07/24/2016   Fall Risk  09/26/2015 05/26/2015 04/29/2015 04/04/2015 03/17/2015  Falls in the past year? Yes Yes Yes Yes Yes  Number falls in past yr: 1 1 1 1 2  or more  Injury with Fall? No Yes Yes Yes Yes  Risk Factor Category  - High Fall Risk - High Fall Risk High Fall Risk  Risk for fall due to : Impaired balance/gait History of fall(s);Impaired balance/gait;Impaired mobility - History of fall(s);Impaired balance/gait;Impaired mobility History of fall(s);Impaired balance/gait;Impaired mobility  Follow up Falls evaluation completed Falls evaluation completed - Falls evaluation completed Falls evaluation completed   Functional Status Survey:    Vitals:   09/27/16 1128  BP: (!) 146/74  Pulse: (!) 50  Resp: 20  Temp: 97.5 F (36.4 C)  SpO2: 96%  Weight: 158 lb (71.7 kg)  Height: 5\' 9"  (1.753 m)   Body mass index is 23.33 kg/m.  Weight 176# in Jan 2017. 153# in Aug 2017  Physical Exam  Constitutional: He is oriented to person, place, and time.  Frail  HENT:  Head: Normocephalic and atraumatic.  Right Ear: No drainage, swelling or tenderness. No foreign bodies. No mastoid tenderness. Tympanic membrane is not injected, not scarred, not perforated, not erythematous, not retracted and not bulging. Tympanic membrane mobility is normal. No hemotympanum. Decreased hearing is noted.  Left Ear: No drainage, swelling or tenderness. No foreign bodies. No mastoid tenderness. Tympanic membrane is not injected, not scarred, not perforated, not erythematous, not  retracted and not bulging. Tympanic membrane mobility is normal. No hemotympanum. Decreased hearing is noted.  Nose: Nose normal.  Bilateral hearing aids.  Eyes: Conjunctivae are normal. Pupils are equal, round, and reactive to light.  Prescription lenses.  Neck: Normal range of motion. Neck supple. No JVD present. No thyromegaly present.  Cardiovascular: Normal rate and regular rhythm.  Exam reveals no gallop and no friction rub.   Murmur heard.  Systolic murmur is present with a  grade of 1/6  Absent DP and PT  Pulmonary/Chest: Effort normal. He has no wheezes. He has rales. He exhibits no tenderness.  Using portable oxygen at 2 L/m today. Moist rales posterior mid to lower lungs.   Abdominal: Soft. Bowel sounds are normal. He exhibits no distension. There is no tenderness.  Musculoskeletal: Normal range of motion. He exhibits edema. He exhibits no tenderness.  Pain in the left SI joint area. Limps when walking. Favors the left side. Effusion of the left knee. Scars in the left knee from previous surgery. Tender at the left knee with movement of the patella. Positional lower back pain Server kyphosis RLE edema trace  Neurological: He is alert and oriented to person, place, and time. He has normal strength. A sensory deficit is present. No cranial nerve deficit. Gait abnormal.  07/13/14 MMSE 28/30. Passed clock drawing. Diminshed sensation to vabration and monofilament.  Skin: Skin is warm and dry. No erythema. No pallor.  Rough area on the left cheek. Sebaceous cyst  in the mid forehead. Mid sternal surgical scar. Left upper chest pacemaker.  Psychiatric: He has a normal mood and affect. His behavior is normal. Thought content normal.  Patient is in denial regarding the extent of his memory deficit.    Labs reviewed:  Recent Labs  12/05/15 1055  02/09/16 03/06/16 1448 03/29/16  NA 139  < > 139 140 137  K 4.3  < > 4.2 4.4 4.5  CL 104  --   --  101  --   CO2 24  --   --  29  --     GLUCOSE 210*  --   --  98  --   BUN 38*  < > 50* 45* 43*  CREATININE 0.82  < > 1.3 1.15* 1.1  CALCIUM 9.2  --   --  9.6  --   < > = values in this interval not displayed.  Recent Labs  01/12/16 02/09/16 03/29/16  AST 18 25 19   ALT 10 15 12   ALKPHOS 104 210* 131*    Recent Labs  02/20/16 03/22/16 05/31/16  WBC 5.9 5.4 6.6  HGB 11.2* 12.9* 14.2  HCT 37* 40* 42  PLT 194 143* 172   Lab Results  Component Value Date   TSH 3.16 06/06/2015   Lab Results  Component Value Date   HGBA1C 6.7 (A) 06/06/2015   Lab Results  Component Value Date   CHOL 132 02/07/2015   HDL 33 (A) 02/07/2015   LDLCALC 76 02/07/2015   LDLDIRECT 85.6 07/19/2010   TRIG 115 02/07/2015   CHOLHDL 4 03/06/2011    Significant Diagnostic Results in last 30 days:  No results found.  Assessment/Plan  1. Chronic combined systolic and diastolic CHF (congestive heart failure) ([place]) compensated  2. CKD stage 3 due to type 1 diabetes mellitus (HCC) stable  3. Type 2 diabetes mellitus with diabetic nephropathy, without long-term current use of insulin (HCC) Stable/ controlled  4. Essential hypertension controlled  5. Failure to thrive in adult Continue to observe. Regained 5 # weight since Aug 2017.  6. Iron deficiency anemia, unspecified iron deficiency anemia type resolved  7. Memory loss Gradual decline  8. Chronic respiratory failure, unspecified whether with hypoxia or hypercapnia (HCC) Attempt to wen off O2

## 2016-10-30 ENCOUNTER — Encounter: Payer: Self-pay | Admitting: *Deleted

## 2016-10-30 ENCOUNTER — Non-Acute Institutional Stay (SKILLED_NURSING_FACILITY): Payer: Medicare Other | Admitting: Nurse Practitioner

## 2016-10-30 ENCOUNTER — Encounter: Payer: Self-pay | Admitting: Nurse Practitioner

## 2016-10-30 DIAGNOSIS — I739 Peripheral vascular disease, unspecified: Secondary | ICD-10-CM | POA: Diagnosis not present

## 2016-10-30 DIAGNOSIS — D500 Iron deficiency anemia secondary to blood loss (chronic): Secondary | ICD-10-CM

## 2016-10-30 DIAGNOSIS — M4850XS Collapsed vertebra, not elsewhere classified, site unspecified, sequela of fracture: Secondary | ICD-10-CM | POA: Diagnosis not present

## 2016-10-30 DIAGNOSIS — I480 Paroxysmal atrial fibrillation: Secondary | ICD-10-CM | POA: Diagnosis not present

## 2016-10-30 DIAGNOSIS — I10 Essential (primary) hypertension: Secondary | ICD-10-CM | POA: Diagnosis not present

## 2016-10-30 DIAGNOSIS — K5900 Constipation, unspecified: Secondary | ICD-10-CM

## 2016-10-30 DIAGNOSIS — F325 Major depressive disorder, single episode, in full remission: Secondary | ICD-10-CM

## 2016-10-30 DIAGNOSIS — E1022 Type 1 diabetes mellitus with diabetic chronic kidney disease: Secondary | ICD-10-CM

## 2016-10-30 DIAGNOSIS — R413 Other amnesia: Secondary | ICD-10-CM

## 2016-10-30 DIAGNOSIS — N183 Chronic kidney disease, stage 3 (moderate): Secondary | ICD-10-CM

## 2016-10-30 DIAGNOSIS — I5042 Chronic combined systolic (congestive) and diastolic (congestive) heart failure: Secondary | ICD-10-CM | POA: Diagnosis not present

## 2016-10-30 DIAGNOSIS — K219 Gastro-esophageal reflux disease without esophagitis: Secondary | ICD-10-CM | POA: Diagnosis not present

## 2016-10-30 DIAGNOSIS — R233 Spontaneous ecchymoses: Secondary | ICD-10-CM | POA: Diagnosis not present

## 2016-10-30 DIAGNOSIS — IMO0001 Reserved for inherently not codable concepts without codable children: Secondary | ICD-10-CM

## 2016-10-30 NOTE — Assessment & Plan Note (Signed)
Frail skin contributory, protective sleeves, observe.

## 2016-10-30 NOTE — Assessment & Plan Note (Signed)
Mood is stable, not on a mood stabilizer.

## 2016-10-30 NOTE — Assessment & Plan Note (Signed)
01/13/16 Furosemide 20mg , Spironolactone 25mg .  02/09/16 BNP 1959.3 05/22/16 Na 137, K 4.0, Bun 57, creat 1.15, BNP 993.7 Compensating clinically.

## 2016-10-30 NOTE — Progress Notes (Signed)
Location:  [facility] Room Number: 9 Place of Service:  SNF (31) Provider:  Mast, Manxie  NP  Jeanmarie Hubert, MD  Patient Care Team: Estill Dooms, MD as PCP - General (Internal Medicine) Irine Seal, MD as Consulting Physician (Urology) Renella Cunas, MD (Inactive) as Consulting Physician (Cardiology) [facility] Lelon Perla, MD as Consulting Physician (Cardiology) Man Otho Darner, NP as Nurse Practitioner (Nurse Practitioner) Rigoberto Noel, MD as Consulting Physician (Pulmonary Disease)  Extended Emergency Contact Information Primary Emergency Contact: Kathline Magic, Rosezella Rumpf of [place] Phone: [phone] Relation: Daughter Secondary Emergency Contact: Johnnette Gourd [place] of [place] Phone: [phone] Mobile Phone: [phone] Relation: Son  Code Status:  DNR Goals of care: Advanced Directive information Advanced Directives 10/30/2016  Does patient have an advance directive? Yes  Type of Paramedic of [place];Living will;Out of facility DNR (pink MOST or yellow form)  Does patient want to make changes to advanced directive? No - Patient declined  Copy of advanced directive(s) in chart? Yes  Pre-existing out of facility DNR order (yellow form or pink MOST form) -     Chief Complaint  Patient presents with  . Medical Management of Chronic Issues    HPI:  Pt is a 82 y.o. male seen today for medical management of chronic diseases.    Hx of Afib, heart rate is in control, dementia, depression, stable, HTN, controlled while on furosemide 20mg  and Spironolactone 25mg , anemia, Hgb 7.6, dropped from 11.4 12/22/15, repeated Hgb 7.3 01/09/16, improved,  05/31/16 Hgb 14.2, off Iron,  ASA/Elliquis dc'd,  CHF BNP 1449.17, compensated  on Furosemide 20mg  and Spironolactone 25mg  daily.   Past Medical History:  Diagnosis Date  . ANEMIA-IRON DEFICIENCY 05/27/2007  . ANXIETY 11/14/2007  .  BENIGN PROSTATIC HYPERTROPHY 11/14/2007  . CAROTID ARTERY DISEASE 10/02/2010  . Chronic combined systolic and diastolic CHF (congestive heart failure) ([place]) 10/10/2014  . CKD stage 3 due to type 1 diabetes mellitus ([place]) 03/17/2015  . COLONIC POLYPS, HX OF 05/27/2007  . CORONARY ARTERY DISEASE 11/14/2007   prior bypass [phone]; Inferior MI 11/07; Severe 3 vessel disease; occluded SVG to RCA; SVG to OM subtotalled, SVG to diagonal with ostial 70 and 80 mid; EF 45. PCI of SVG to OM. FU myovue showed EF 56 with inferior infarct and very mild peri-infarct ischemia  . DEPRESSION 11/14/2007  . DIABETES MELLITUS, TYPE II 11/14/2007  . DIVERTICULITIS, HX OF 05/27/2007  . DM (diabetes mellitus), type 2 with renal complications ([place]) [id number]  . DVT (deep venous thrombosis) ([place]) 03/21/2011  . Dyspnea 11/09/2014  . Effusion of left knee 03/17/2015  . Failure to thrive in adult 05/03/2015  . FOOT PAIN, BILATERAL 12/08/2010  . Generalized weakness 05/20/2015  . GERD 11/14/2007  . Hearing loss 05/13/2009   Qualifier: Diagnosis of  By: Jenny Reichmann MD, [place] [facility]is   . Hyperlipemia 11/14/2007   Qualifier: Diagnosis of  By: Jenny Reichmann MD, [place] [facility]is    . HYPERLIPIDEMIA 11/14/2007  . HYPERTENSION 11/14/2007  . HYPOTENSION, ORTHOSTATIC 03/06/2011  . Intermittent high-grade heart block 09/05/2009   s/p pacemaker  . Ischemic cardiomyopathy    a. echo (10/15):  mod focal basal septal hypertrophy, EF 40-45%, inf AK, Gr 1 DD, Al sclerosis, mild AI, MAC, mild MR, mod LAE, lipomatous hypertrophy, small effusion (no hemodynamic compromise)   . Memory loss 06/01/2008  . Myalgia and myositis, unspecified 04/24/2011  .  Osteoarthrosis, unspecified whether generalized or localized, unspecified site   . OSTEOPOROSIS 06/02/2010  . Other malaise and fatigue 04/24/2011  . Pacemaker-St. Jude 02/02/2008   St. Jude  . Peripheral neuropathy ([place]) 03/27/2011  . PERIPHERAL VASCULAR DISEASE 11/14/2007  . Prostate cancer ([place])   . Protein-calorie  malnutrition, severe ([place]) 05/23/2015  . RENAL INSUFFICIENCY 11/14/2007  . Seborrheic keratosis 06/05/2011  . SECONDARY DM W/PERIPHERAL CIRC D/O UNCONTROLLED 11/07/2010  . Severe back pain   . SYNCOPE 09/08/2008  . Unspecified hearing loss 05/13/2009  . Vertebral compression fracture ([place]) 09/05/2009   Qualifier: Diagnosis of  By: Jenny Reichmann MD, [place]is   . [name] 04/04/2015  . Weight loss 10/10/2014   Past Surgical History:  Procedure Laterality Date  . APPENDECTOMY    . CHOLECYSTECTOMY    . CORONARY ARTERY BYPASS GRAFT  2007  . CYSTOSCOPY    . PACEMAKER INSERTION  2012   Caryl Comes, MD  . prostate needle biopsy    . s/p bilat knee replacement  1992   Paul, MD  . s/p coronary stent x 1      Allergies  Allergen Reactions  . Hydrocodone     hallucination  . Tramadol     hallucination      Medication List       Accurate as of 10/30/16  2:05 PM. Always use your most recent med list.          BOOST DIABETIC Liqd Take 237 mLs by mouth 3 (three) times daily between meals.   docusate sodium 100 MG capsule Commonly known as:  COLACE Take 100 mg by mouth 2 (two) times daily.   famotidine 20 MG tablet Commonly known as:  PEPCID Take 20 mg by mouth at bedtime. For GERD   feeding supplement (PRO-STAT SUGAR FREE 64) Liqd Take 30 mLs by mouth 2 (two) times daily with a meal.   fentaNYL 25 MCG/HR patch Commonly known as:  DURAGESIC Apply fresh patch every 3 days to help pain. Remove old patch.   furosemide 20 MG tablet Commonly known as:  LASIX Take 10 mg by mouth daily.   nitroGLYCERIN 0.4 MG SL tablet Commonly known as:  NITROSTAT Place 0.4 mg under the tongue every 5 (five) minutes as needed for chest pain.   OXYGEN Inhale 2 L/min into the lungs as needed.   spironolactone 25 MG tablet Commonly known as:  ALDACTONE Take 12.5 mg by mouth daily.   SYSTANE BALANCE 0.6 % Soln Generic drug:  Propylene Glycol Apply to eye. One drop both eyes 2 times a day due to dry eyes     zinc oxide 20 % ointment Apply 1 application topically. Apply ointment as needed to buttocks       Review of Systems  Constitutional: Negative for chills and fever.  HENT: Positive for hearing loss. Negative for ear pain.   Eyes: Negative for photophobia and pain.       Corrective lenses  Respiratory: Positive for shortness of breath. Negative for cough and wheezing.        O2 dependent  Cardiovascular: Positive for leg swelling.       Pacemaker. History of syncope related to cardiac arrhythmia. BLE edema trace R>L  Gastrointestinal: Negative for abdominal pain.  Endocrine: Negative for polydipsia.  Genitourinary: Positive for frequency and urgency. Negative for dysuria and flank pain.       Underwent Urology evaluation in the past, off meds. Episodes of incontinence persist.  Musculoskeletal: Positive for back pain, gait  problem, myalgias and neck pain (Fall on 03/15/15 causing pain in the left knee and effusion.).       Poor balance and gait disturbance. Lower back pain Sever kyphosis  Skin:       Rough area on left cheek. Spontaneous ecchymoses in all extremities.     Neurological: Negative for tremors, seizures and weakness.       Memory loss  Psychiatric/Behavioral:       Mild to moderate memory disturbance.    Immunization History  Administered Date(s) Administered  . Influenza Whole 09/23/2010, 09/23/2012  . Influenza,inj,Quad PF,36+ Mos 10/09/2014  . Influenza-Unspecified 09/10/2013, 09/30/2015, 10/05/2016  . PPD Test 05/23/2015  . Pneumococcal Polysaccharide-23 03/31/2009  . Td 03/31/2009   Pertinent  Health Maintenance Due  Topic Date Due  . OPHTHALMOLOGY EXAM  06/26/1933  . PNA vac Low Risk Adult (2 of 2 - PCV13) 03/31/2010  . URINE MICROALBUMIN  06/03/2011  . HEMOGLOBIN A1C  12/06/2015  . FOOT EXAM  03/16/2016  . INFLUENZA VACCINE  Completed   Fall Risk  09/26/2015 05/26/2015 04/29/2015 04/04/2015 03/17/2015  Falls in the past year? Yes Yes Yes Yes Yes   Number falls in past yr: 1 1 1 1 2  or more  Injury with Fall? No Yes Yes Yes Yes  Risk Factor Category  - High Fall Risk - High Fall Risk High Fall Risk  Risk for fall due to : Impaired balance/gait History of fall(s);Impaired balance/gait;Impaired mobility - History of fall(s);Impaired balance/gait;Impaired mobility History of fall(s);Impaired balance/gait;Impaired mobility  Follow up Falls evaluation completed Falls evaluation completed - Falls evaluation completed Falls evaluation completed   Functional Status Survey:    Vitals:   10/30/16 1119  BP: 130/70  Pulse: 76  Resp: 20  Temp: 97.8 F (36.6 C)  Weight: 156 lb (70.8 kg)  Height: 5\' 9"  (1.753 m)   Body mass index is 23.04 kg/m. Physical Exam  Constitutional: He is oriented to person, place, and time.  Frail  HENT:  Head: Normocephalic and atraumatic.  Right Ear: No drainage, swelling or tenderness. No foreign bodies. No mastoid tenderness. Tympanic membrane is not injected, not scarred, not perforated, not erythematous, not retracted and not bulging. Tympanic membrane mobility is normal. No hemotympanum. Decreased hearing is noted.  Left Ear: No drainage, swelling or tenderness. No foreign bodies. No mastoid tenderness. Tympanic membrane is not injected, not scarred, not perforated, not erythematous, not retracted and not bulging. Tympanic membrane mobility is normal. No hemotympanum. Decreased hearing is noted.  Nose: Nose normal.  Bilateral hearing aids.  Eyes: Conjunctivae are normal. Pupils are equal, round, and reactive to light.  Prescription lenses.  Neck: Normal range of motion. Neck supple. No JVD present. No thyromegaly present.  Cardiovascular: Normal rate and regular rhythm.  Exam reveals no gallop and no friction rub.   Murmur heard.  Systolic murmur is present with a grade of 1/6  Absent DP and PT  Pulmonary/Chest: Effort normal. He has no wheezes. He has rales. He exhibits no tenderness.  Using portable  oxygen at 2 L/m today. Moist rales posterior mid to lower lungs.   Abdominal: Soft. Bowel sounds are normal. He exhibits no distension. There is no tenderness.  Musculoskeletal: Normal range of motion. He exhibits edema. He exhibits no tenderness.  Pain in the left SI joint area. Limps when walking. Favors the left side. Effusion of the left knee. Scars in the left knee from previous surgery. Tender at the left knee with movement of the patella.  Positional lower back pain Server kyphosis RLE edema trace  Neurological: He is alert and oriented to person, place, and time. He has normal strength. A sensory deficit is present. No cranial nerve deficit. Gait abnormal.  07/13/14 MMSE 28/30. Passed clock drawing. Diminshed sensation to vabration and monofilament.  Skin: Skin is warm and dry. No erythema. No pallor.  spontaneous ecchymoses in all extremities.      Psychiatric: He has a normal mood and affect. His behavior is normal. Thought content normal.  Patient is in denial regarding the extent of his memory deficit.    Labs reviewed:  Recent Labs  12/05/15 1055  02/09/16 03/06/16 1448 03/29/16  NA 139  < > 139 140 137  K 4.3  < > 4.2 4.4 4.5  CL 104  --   --  101  --   CO2 24  --   --  29  --   GLUCOSE 210*  --   --  98  --   BUN 38*  < > 50* 45* 43*  CREATININE 0.82  < > 1.3 1.15* 1.1  CALCIUM 9.2  --   --  9.6  --   < > = values in this interval not displayed.  Recent Labs  01/12/16 02/09/16 03/29/16  AST 18 25 19   ALT 10 15 12   ALKPHOS 104 210* 131*    Recent Labs  02/20/16 03/22/16 05/31/16  WBC 5.9 5.4 6.6  HGB 11.2* 12.9* 14.2  HCT 37* 40* 42  PLT 194 143* 172   Lab Results  Component Value Date   TSH 3.16 06/06/2015   Lab Results  Component Value Date   HGBA1C 6.7 (A) 06/06/2015   Lab Results  Component Value Date   CHOL 132 02/07/2015   HDL 33 (A) 02/07/2015   LDLCALC 76 02/07/2015   LDLDIRECT 85.6 07/19/2010   TRIG 115 02/07/2015   CHOLHDL 4  03/06/2011    Significant Diagnostic Results in last 30 days:  No results found.  Assessment/Plan Essential hypertension Controlled, continue Furosemide 20mg  and Spironolactone 25mg  daily, update CBC CMP TSH     Peripheral vascular disease Stable, chronic mild venous dermatitis BLE   Chronic combined systolic and diastolic CHF (congestive heart failure) (HCC) 01/13/16 Furosemide 20mg , Spironolactone 25mg .  02/09/16 BNP 1959.3 05/22/16 Na 137, K 4.0, Bun 57, creat 1.15, BNP 993.7 Compensating clinically.     Paroxysmal atrial fibrillation (HCC) Rate is in control w/o medications. off Eliquis 2nd to Hgb 7s.    GERD Stable, continue Omeprazole 20mg  daily,  Hgb dropped from 11.4 12/12/15 to 7.6 01/02/16, resolved, last Hgb 14.3 05/31/16, continue to be off ASA/Elliquis/Fe   Constipation Stable, continue 06/10/15 Lactulose prn. Colace bid   CKD stage 3 due to type 1 diabetes mellitus (HCC) Baseline creat 1-1.5. 05/22/16 Na 137, K 4.0, Bun 57, creat 1.15, BNP 993.7  Vertebral compression fracture Controlled, continue Fentanyl transdermal patch  Iron deficiency anemia 05/31/16 Hgb 14.2, off Iron  Depression Mood is stable, not on a mood stabilizer.   Memory loss Progressing. off Namenda R>B. SNF for care needs. Comfort measures.     Spontaneous ecchymosis Frail skin contributory, protective sleeves, observe.       Family/ staff Communication: continue SNF for care assistance.   Labs/tests ordered:  CBC, CMP, TSH

## 2016-10-30 NOTE — Assessment & Plan Note (Signed)
05/31/16 Hgb 14.2, off Iron

## 2016-10-30 NOTE — Assessment & Plan Note (Signed)
Controlled, continue Furosemide 20mg  and Spironolactone 25mg  daily, update CBC CMP TSH

## 2016-10-30 NOTE — Assessment & Plan Note (Signed)
Stable, continue 06/10/15 Lactulose prn. Colace bid.  

## 2016-10-30 NOTE — Assessment & Plan Note (Signed)
Rate is in control w/o medications. off Eliquis 2nd to Hgb 7s.  

## 2016-10-30 NOTE — Assessment & Plan Note (Signed)
Stable, chronic mild venous dermatitis BLE

## 2016-10-30 NOTE — Assessment & Plan Note (Signed)
Baseline creat 1-1.5. 05/22/16 Na 137, K 4.0, Bun 57, creat 1.15, BNP 993.7

## 2016-10-30 NOTE — Assessment & Plan Note (Signed)
Progressing. off Namenda R>B. SNF for care needs. Comfort measures.   

## 2016-10-30 NOTE — Assessment & Plan Note (Signed)
Controlled, continue Fentanyl transdermal patch  

## 2016-10-30 NOTE — Assessment & Plan Note (Signed)
Stable, continue Omeprazole 20mg  daily,  Hgb dropped from 11.4 12/12/15 to 7.6 01/02/16, resolved, last Hgb 14.3 05/31/16, continue to be off ASA/Elliquis/Fe

## 2016-10-30 NOTE — Progress Notes (Signed)
This encounter was created in error - please disregard.

## 2016-11-01 DIAGNOSIS — I10 Essential (primary) hypertension: Secondary | ICD-10-CM | POA: Diagnosis not present

## 2016-11-01 DIAGNOSIS — N179 Acute kidney failure, unspecified: Secondary | ICD-10-CM | POA: Diagnosis not present

## 2016-11-01 DIAGNOSIS — M6281 Muscle weakness (generalized): Secondary | ICD-10-CM | POA: Diagnosis not present

## 2016-11-01 LAB — HEPATIC FUNCTION PANEL
ALK PHOS: 106 U/L (ref 25–125)
ALT: 14 U/L (ref 10–40)
AST: 22 U/L (ref 14–40)
BILIRUBIN, TOTAL: 0.8 mg/dL

## 2016-11-01 LAB — BASIC METABOLIC PANEL
BUN: 48 mg/dL — AB (ref 4–21)
CREATININE: 1.2 mg/dL (ref <=1.3)
Glucose: 115 mg/dL
Potassium: 4.4 mmol/L (ref 3.4–5.3)
SODIUM: 139 mmol/L (ref 137–147)

## 2016-11-01 LAB — TSH: TSH: 6.09 u[IU]/mL — AB (ref <=5.90)

## 2016-11-01 LAB — CBC AND DIFFERENTIAL
HEMATOCRIT: 43 % (ref 41–53)
Hemoglobin: 14.4 g/dL (ref 13.5–17.5)
Platelets: 124 10*3/uL — AB (ref 150–399)
WBC: 5.6 10^3/mL

## 2016-11-02 ENCOUNTER — Other Ambulatory Visit: Payer: Self-pay | Admitting: *Deleted

## 2016-11-02 ENCOUNTER — Encounter: Payer: Self-pay | Admitting: Nurse Practitioner

## 2016-11-02 DIAGNOSIS — E039 Hypothyroidism, unspecified: Secondary | ICD-10-CM | POA: Insufficient documentation

## 2016-11-29 NOTE — Progress Notes (Signed)
Electrophysiology Office Note Date: 11/30/2016  ID:  [name], DOB 04/15/1923, MRN [id number]  PCP: Jeanmarie Hubert, MD Primary Cardiologist: Stanford Breed Electrophysiologist: Caryl Comes  CC: Pacemaker follow-up  Zachary Harris is a 82 y.o. male seen today for Dr Caryl Comes.  He presents today for routine electrophysiology followup.  Since last being seen in our clinic, the patient reports that he has been stable. Daughter is with him today. He eats breakfast but does not have much more of an appetite. He sleeps a lot during the day.  It is increasingly difficult for him to come to office visits.  He denies chest pain, palpitations, dyspnea, PND, orthopnea, nausea, vomiting, dizziness, syncope, weight gain, or early satiety.  Device History: STJ dual chamber PPM implanted 2012 for CHB   Past Medical History:  Diagnosis Date  . ANEMIA-IRON DEFICIENCY 05/27/2007  . ANXIETY 11/14/2007  . BENIGN PROSTATIC HYPERTROPHY 11/14/2007  . CAROTID ARTERY DISEASE 10/02/2010  . Chronic combined systolic and diastolic CHF (congestive heart failure) ([place]) 10/10/2014  . CKD stage 3 due to type 1 diabetes mellitus ([place]) 03/17/2015  . COLONIC POLYPS, HX OF 05/27/2007  . CORONARY ARTERY DISEASE 11/14/2007   prior bypass [phone]; Inferior MI 11/07; Severe 3 vessel disease; occluded SVG to RCA; SVG to OM subtotalled, SVG to diagonal with ostial 70 and 80 mid; EF 45. PCI of SVG to OM. FU myovue showed EF 56 with inferior infarct and very mild peri-infarct ischemia  . DEPRESSION 11/14/2007  . DIABETES MELLITUS, TYPE II 11/14/2007  . DIVERTICULITIS, HX OF 05/27/2007  . DM (diabetes mellitus), type 2 with renal complications ([place]) [id number]  . DVT (deep venous thrombosis) ([place]) 03/21/2011  . Dyspnea 11/09/2014  . Effusion of left knee 03/17/2015  . Failure to thrive in adult 05/03/2015  . FOOT PAIN, BILATERAL 12/08/2010  . Generalized weakness 05/20/2015  . GERD 11/14/2007  . Hearing loss 05/13/2009   Qualifier:  Diagnosis of  By: Jenny Reichmann MD, [place] [facility]   . Hyperlipemia 11/14/2007   Qualifier: Diagnosis of  By: Jenny Reichmann MD, [place] Oris    . HYPERLIPIDEMIA 11/14/2007  . HYPERTENSION 11/14/2007  . HYPOTENSION, ORTHOSTATIC 03/06/2011  . Intermittent high-grade heart block 09/05/2009   s/p pacemaker  . Ischemic cardiomyopathy    a. echo (10/15):  mod focal basal septal hypertrophy, EF 40-45%, inf AK, Gr 1 DD, Al sclerosis, mild AI, MAC, mild MR, mod LAE, lipomatous hypertrophy, small effusion (no hemodynamic compromise)   . Memory loss 06/01/2008  . Myalgia and myositis, unspecified 04/24/2011  . Osteoarthrosis, unspecified whether generalized or localized, unspecified site   . OSTEOPOROSIS 06/02/2010  . Other malaise and fatigue 04/24/2011  . Pacemaker-St. Jude 02/02/2008   St. Jude  . Peripheral neuropathy ([place]) 03/27/2011  . PERIPHERAL VASCULAR DISEASE 11/14/2007  . Prostate cancer ([place])   . Protein-calorie malnutrition, severe ([place]) 05/23/2015  . RENAL INSUFFICIENCY 11/14/2007  . Seborrheic keratosis 06/05/2011  . SECONDARY DM W/PERIPHERAL CIRC D/O UNCONTROLLED 11/07/2010  . Severe back pain   . SYNCOPE 09/08/2008  . Unspecified hearing loss 05/13/2009  . Vertebral compression fracture ([place]) 09/05/2009   Qualifier: Diagnosis of  By: Jenny Reichmann MD, [place] [facility]   . [name] 04/04/2015  . Weight loss 10/10/2014   Past Surgical History:  Procedure Laterality Date  . APPENDECTOMY    . CHOLECYSTECTOMY    . CORONARY ARTERY BYPASS GRAFT  2007  . CYSTOSCOPY    . PACEMAKER INSERTION  2012   Caryl Comes, MD  . prostate needle biopsy    .  s/p bilat knee replacement  1992   Paul, MD  . s/p coronary stent x 1      Current Outpatient Prescriptions  Medication Sig Dispense Refill  . Amino Acids-Protein Hydrolys (FEEDING SUPPLEMENT, PRO-STAT SUGAR FREE 64,) LIQD Take 30 mLs by mouth 3 (three) times daily with meals.     . docusate sodium (COLACE) 100 MG capsule Take 100 mg by mouth 2 (two) times daily.    . famotidine (PEPCID) 20 MG tablet Take  20 mg by mouth at bedtime. For GERD    . fentaNYL (DURAGESIC) 25 MCG/HR patch Apply fresh patch every 3 days to help pain. Remove old patch. 10 patch 0  . furosemide (LASIX) 20 MG tablet Take 10 mg by mouth daily.     Marland Kitchen levothyroxine (SYNTHROID, LEVOTHROID) 25 MCG tablet Take 25 mcg by mouth daily.    . nitroGLYCERIN (NITROSTAT) 0.4 MG SL tablet Place 0.4 mg under the tongue every 5 (five) minutes as needed for chest pain.    . Nutritional Supplements (BOOST DIABETIC) LIQD Take 237 mLs by mouth 3 (three) times daily between meals.    . OXYGEN Inhale 4 L/min into the lungs as needed.     Marland Kitchen Propylene Glycol (SYSTANE BALANCE) 0.6 % SOLN Apply to eye. One drop both eyes 2 times a day due to dry eyes    . spironolactone (ALDACTONE) 25 MG tablet Take 12.5 mg by mouth daily.     Marland Kitchen zinc oxide 20 % ointment Apply 1 application topically. Apply ointment as needed to buttocks     No current facility-administered medications for this visit.     Allergies:   Hydrocodone and Tramadol   Social History: Social History   Social History  . Marital status: Widowed    Spouse name: N/A  . Number of children: 2  . Years of education: College   Occupational History  . retired operated Higher education careers adviser Retired   Social History Main Topics  . Smoking status: Former Smoker    Packs/day: 1.00    Years: 50.00    Types: Cigarettes    Quit date: 10/18/1983  . Smokeless tobacco: Never Used  . Alcohol use No  . Drug use: No  . Sexual activity: No   Other Topics Concern  . Not on file   Social History Narrative   Lives at [facility]. Moved to SNF 05/23/15   Patient is widowed with 2 children.   Patient has a college education.   Patient is right handed.   Patient drinks 2 cups daily.   Walking with wallker   Former smoker - stopped 1984   Alcohol none   DNR, MOST, POA, Living Will    Family History: Family History  Problem Relation Age of Onset  . Diabetes Father     25  . Cancer Mother       Review of Systems: All other systems reviewed and are otherwise negative except as noted above.   Physical Exam: VS:  BP 130/60   Pulse 74   Ht 5\' 10"  (1.778 m)   Wt 158 lb (71.7 kg)   SpO2 93%   BMI 22.67 kg/m  , BMI Body mass index is 22.67 kg/m.  GEN- The patient is elderly and chronically ill appearing, alert and oriented x 3 today.   HEENT: normocephalic, atraumatic; sclera clear, conjunctiva pink; hearing intact; oropharynx clear; neck supple  Lungs- Clear to ausculation bilaterally, normal work of breathing.  No wheezes, rales, rhonchi Heart- Regular  rate and rhythm (paced) GI- soft, non-tender, non-distended, bowel sounds present  Extremities- no clubbing, cyanosis, +BLE edema MS- no significant deformity or atrophy Skin- warm and dry, no rash or lesion; PPM pocket well healed Psych- euthymic mood, full affect Neuro- strength and sensation are intact  PPM Interrogation- reviewed in detail today,  See PACEART report  EKG:  EKG is not ordered today.  Recent Labs: 11/01/2016: ALT 14; BUN 48; Creatinine 1.2; Hemoglobin 14.4; Platelets 124; Potassium 4.4; Sodium 139; TSH 6.09   Wt Readings from Last 3 Encounters:  11/30/16 158 lb (71.7 kg)  10/30/16 156 lb (70.8 kg)  09/27/16 158 lb (71.7 kg)     Other studies Reviewed: Additional studies/ records that were reviewed today include: Dr Caryl Comes and Dr Jacalyn Lefevre office notes  Assessment and Plan:  1.  Complete heart block  Normal PPM function See Pace Art report No changes today Will follow remotely going forward and not see in office unless family requests. Daughter is in agreement with this plan as it is very difficult for patient to get out for appointments now.   2.  Paroxysmal atrial fibrillation Burden by device interrogation <1% V rates controlled Eliquis discontinued by PCP for dark/tarry stools   3.  CAD No recent ischemic symptoms Per OV 11/2015 with Dr Stanford Breed, patient would not like to pursue  any more procedures including echo and catheterization Continue medical therapy  4.  HTN Stable No change required today    Current medicines are reviewed at length with the patient today.   The patient does not have concerns regarding his medicines.  The following changes were made today:  none  Labs/ tests ordered today include: none No orders of the defined types were placed in this encounter.    Disposition:   Follow up with Delilah Shan, office visits prn    Signed, Chanetta Marshall, NP 11/30/2016 12:08 PM  [address] [place] [address] [phone] (office) [phone] (fax)

## 2016-11-30 ENCOUNTER — Encounter: Payer: Self-pay | Admitting: Nurse Practitioner

## 2016-11-30 ENCOUNTER — Ambulatory Visit (INDEPENDENT_AMBULATORY_CARE_PROVIDER_SITE_OTHER): Payer: Medicare Other | Admitting: Nurse Practitioner

## 2016-11-30 VITALS — BP 130/60 | HR 74 | Ht 70.0 in | Wt 158.0 lb

## 2016-11-30 DIAGNOSIS — I442 Atrioventricular block, complete: Secondary | ICD-10-CM | POA: Diagnosis not present

## 2016-11-30 DIAGNOSIS — I480 Paroxysmal atrial fibrillation: Secondary | ICD-10-CM

## 2016-11-30 DIAGNOSIS — I10 Essential (primary) hypertension: Secondary | ICD-10-CM | POA: Diagnosis not present

## 2016-11-30 LAB — CUP PACEART INCLINIC DEVICE CHECK
Date Time Interrogation Session: 20171208122907
Implantable Lead Implant Date: 20120326
Implantable Lead Location: 753860
MDC IDC LEAD IMPLANT DT: 20120326
MDC IDC LEAD LOCATION: 753859
MDC IDC PG IMPLANT DT: 20120326
MDC IDC PG SERIAL: 7228315

## 2016-11-30 NOTE — Patient Instructions (Signed)
Medication Instructions:   Your physician recommends that you continue on your current medications as directed. Please refer to the Current Medication list given to you today.    If you need a refill on your cardiac medications before your next appointment, please call your pharmacy.  Labwork: NONE ORDERED  TODAY    Testing/Procedures: NONE ORDERED  TODAY    Follow-Up: Remote monitoring is used to monitor your Pacemaker of ICD from home. This monitoring reduces the number of office visits required to check your device to one time per year. It allows [place] to keep an eye on the functioning of your device to ensure it is working properly. You are scheduled for a device check from home on ...03/01/2017..You may send your transmission at any time that day. If you have a wireless device, the transmission will be sent automatically. After your physician reviews your transmission, you will receive a postcard with your next transmission date.    Any Other Special Instructions Will Be Listed Below (If Applicable).

## 2016-12-01 IMAGING — CR DG LUMBAR SPINE COMPLETE 4+V
5 series · 5 of 5 positions shown · non-contrast
Comparison: 10/14/2013

CLINICAL DATA: Low back pain for several days without acute injury

EXAM:
LUMBAR SPINE - COMPLETE 4+ VIEW

[t lumbar spine ap]
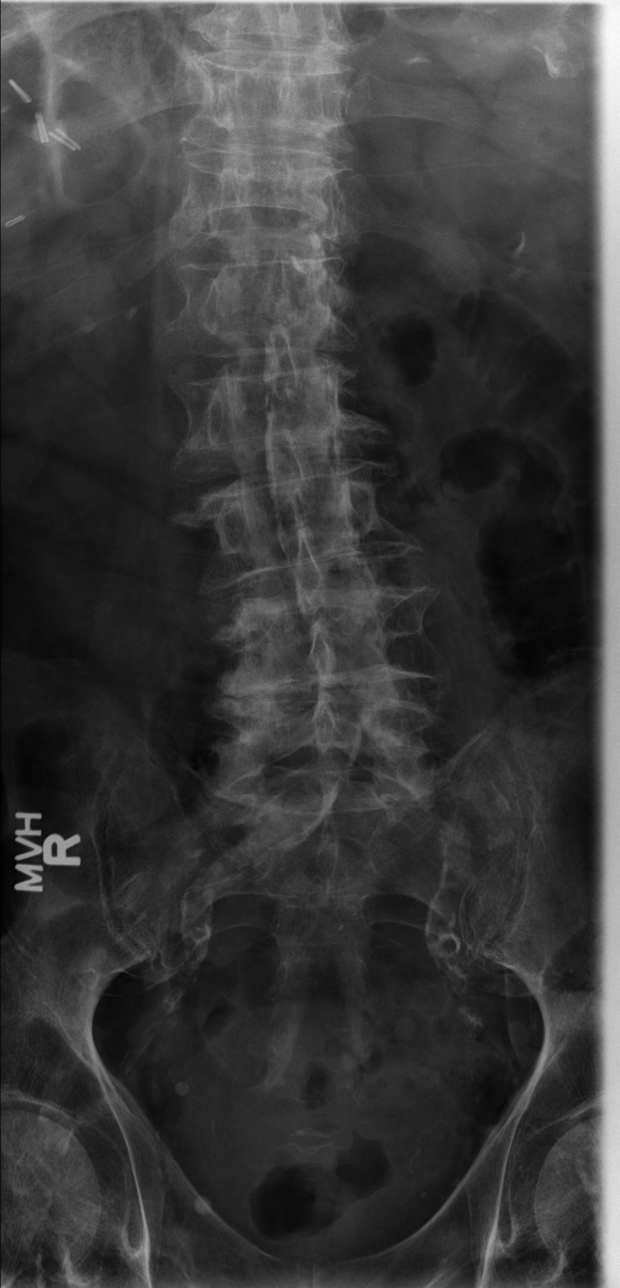

[t lumbar spine obl (1 of 2)]
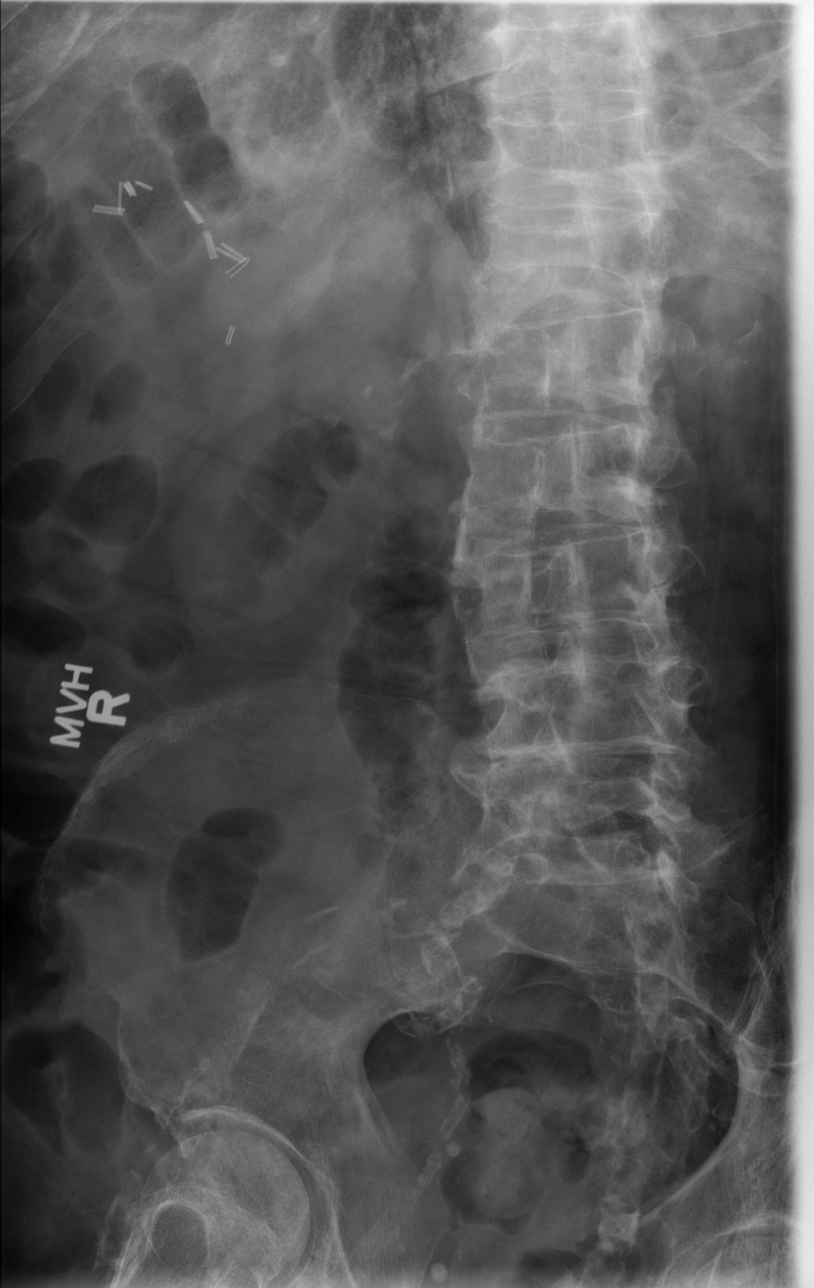

[t lumbar spine obl (2 of 2)]
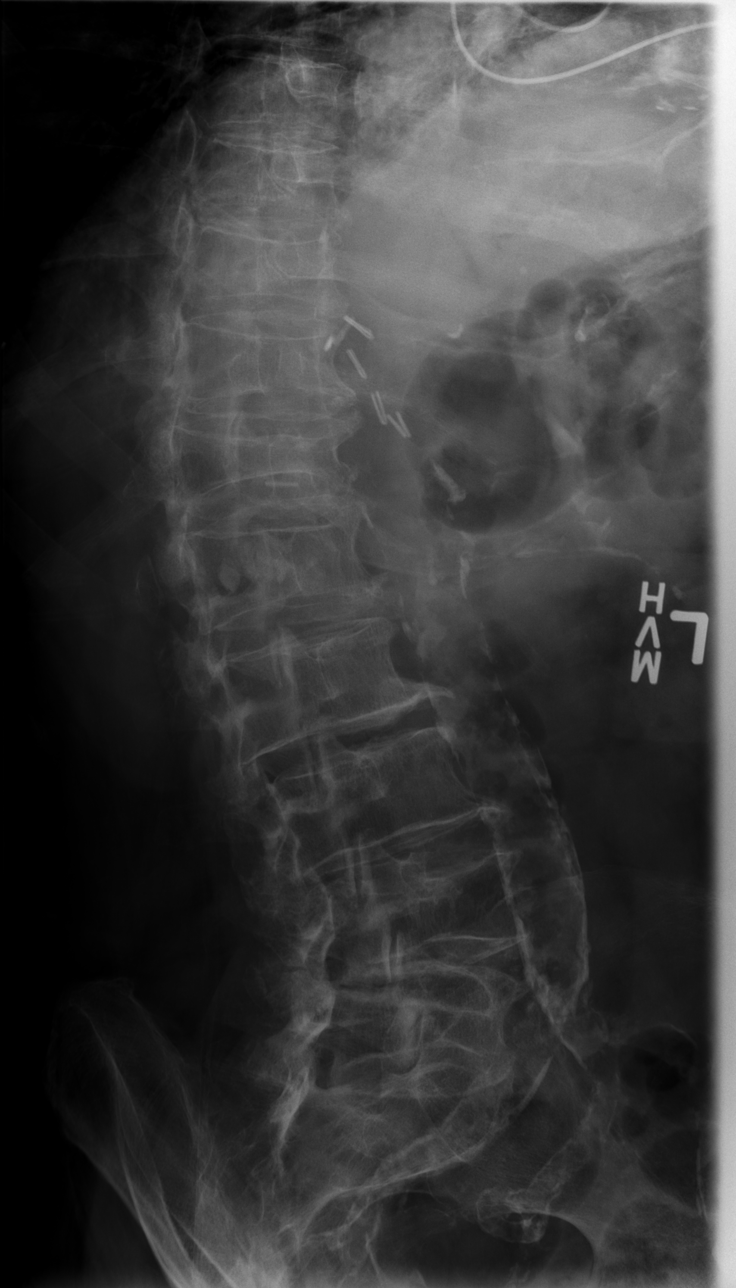

[t lumbar spine lat]
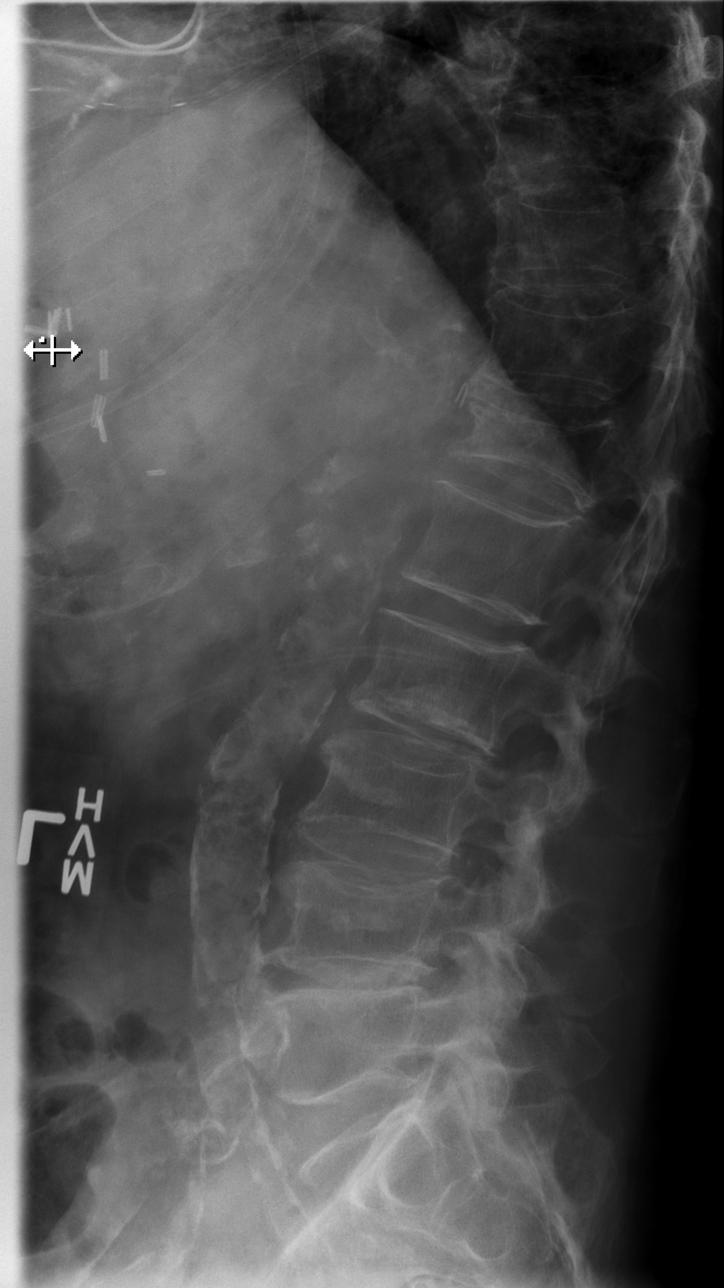

[t lumbar l-5 s-1 spot]
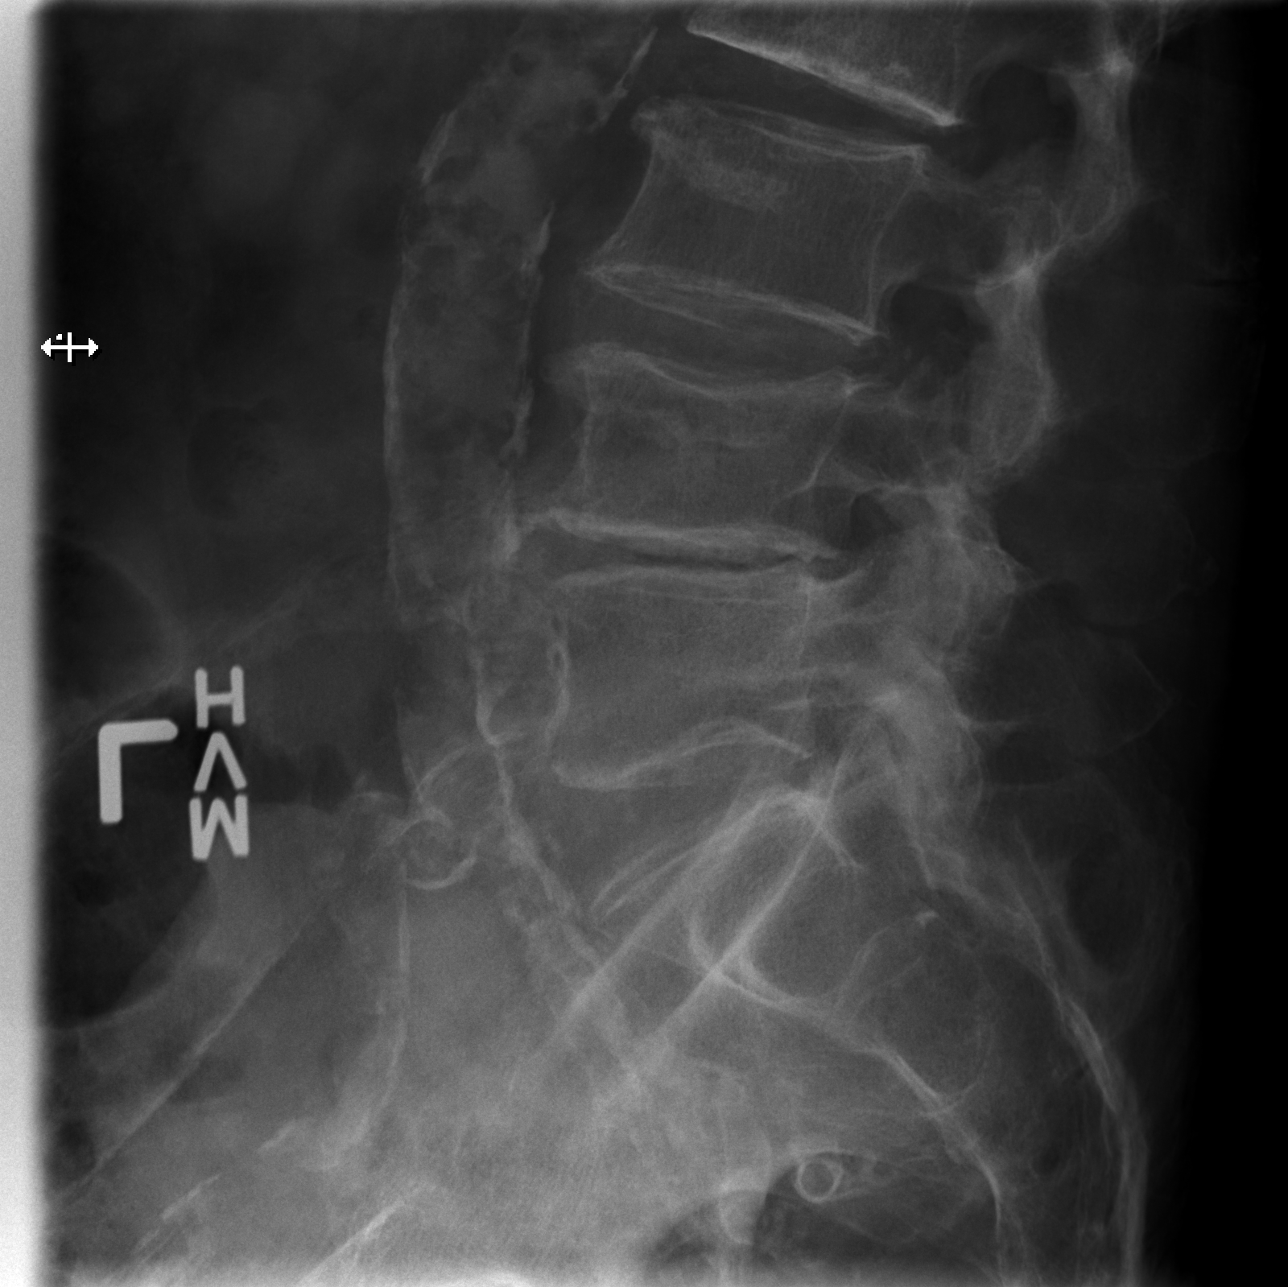

[5 of 5 positions shown; findings below may reference images not displayed]

FINDINGS: There is progressive compression deformity at T12. Approximately 50%
loss vertebral body height on today's exam compared to 30% on prior.
There is endplate sclerosis at L2-L3 L3-L4 and L4-L5 with associated
osteophytosis. No subluxation.
IMPRESSION: 1. No acute fracture.
2. Progressive compression fracture at T12 compared to 10/14/2013
with continued loss of vertebral body height now with approximately
50% vertebral body height.
3. Multilevel disc osteophytic disease.
4. Atherosclerotic calcification aorta.

## 2016-12-04 ENCOUNTER — Encounter: Payer: Self-pay | Admitting: Nurse Practitioner

## 2016-12-04 ENCOUNTER — Non-Acute Institutional Stay (SKILLED_NURSING_FACILITY): Payer: Medicare Other | Admitting: Nurse Practitioner

## 2016-12-04 DIAGNOSIS — E1022 Type 1 diabetes mellitus with diabetic chronic kidney disease: Secondary | ICD-10-CM | POA: Diagnosis not present

## 2016-12-04 DIAGNOSIS — E039 Hypothyroidism, unspecified: Secondary | ICD-10-CM | POA: Diagnosis not present

## 2016-12-04 DIAGNOSIS — N183 Chronic kidney disease, stage 3 (moderate): Secondary | ICD-10-CM | POA: Diagnosis not present

## 2016-12-04 DIAGNOSIS — E1142 Type 2 diabetes mellitus with diabetic polyneuropathy: Secondary | ICD-10-CM | POA: Diagnosis not present

## 2016-12-04 DIAGNOSIS — K219 Gastro-esophageal reflux disease without esophagitis: Secondary | ICD-10-CM

## 2016-12-04 DIAGNOSIS — I5042 Chronic combined systolic (congestive) and diastolic (congestive) heart failure: Secondary | ICD-10-CM | POA: Diagnosis not present

## 2016-12-04 DIAGNOSIS — R413 Other amnesia: Secondary | ICD-10-CM

## 2016-12-04 DIAGNOSIS — K5900 Constipation, unspecified: Secondary | ICD-10-CM | POA: Diagnosis not present

## 2016-12-04 DIAGNOSIS — I480 Paroxysmal atrial fibrillation: Secondary | ICD-10-CM | POA: Diagnosis not present

## 2016-12-04 DIAGNOSIS — I10 Essential (primary) hypertension: Secondary | ICD-10-CM

## 2016-12-04 DIAGNOSIS — M5440 Lumbago with sciatica, unspecified side: Secondary | ICD-10-CM

## 2016-12-04 NOTE — Assessment & Plan Note (Addendum)
01/13/16 Furosemide 20mg , Spironolactone 25mg .  02/09/16 BNP 1959.3 05/22/16 Na 137, K 4.0, Bun 57, creat 1.15, BNP 993.7 11/01/16 wbc 5.6, Hgb 14.4, plt 124, Na 139, K 4.4, Bun 48, creat 1.16, TSH 6.09 Compensating clinically.

## 2016-12-04 NOTE — Assessment & Plan Note (Signed)
11/01/16 TSH 6.09 11/02/16 Levothyroxine 28mcg, TSH 12 weeks.

## 2016-12-04 NOTE — Assessment & Plan Note (Addendum)
Stable, continue Omeprazole 20mg  daily,  Hgb dropped from 11.4 12/12/15 to 7.6 01/02/16, resolved, Hgb 14.3 05/31/16, 14.4 11/01/16, continue to be off ASA/Elliquis/Fe

## 2016-12-04 NOTE — Assessment & Plan Note (Addendum)
Controlled, continue Furosemide 20mg  and Spironolactone 25mg  daily, 11/01/16 wbc 5.6, Hgb 14.4, plt 124, Na 139, K 4.4, Bun 48, creat 1.16, TSH 6.09

## 2016-12-04 NOTE — Assessment & Plan Note (Signed)
Stable, continue 06/10/15 Lactulose prn. Colace bid.  

## 2016-12-04 NOTE — Assessment & Plan Note (Signed)
Controlled, continue Fentanyl transdermal patch  

## 2016-12-04 NOTE — Assessment & Plan Note (Signed)
Progressing. off Namenda R>B. SNF for care needs. Comfort measures.   

## 2016-12-04 NOTE — Assessment & Plan Note (Signed)
11/01/16 Na 139, K 4.4, Bun 48, creat 1.16

## 2016-12-04 NOTE — Progress Notes (Signed)
Location:  [facility] Room Number: 9 Place of Service:  SNF (31) Provider:  Mandela Bello, Manxie  NP  Jeanmarie Hubert, MD  Patient Care Team: Estill Dooms, MD as PCP - General (Internal Medicine) Irine Seal, MD as Consulting Physician (Urology) Renella Cunas, MD (Inactive) as Consulting Physician (Cardiology) [facility] Lelon Perla, MD as Consulting Physician (Cardiology) Kazimierz Springborn Otho Darner, NP as Nurse Practitioner (Nurse Practitioner) Rigoberto Noel, MD as Consulting Physician (Pulmonary Disease)  Extended Emergency Contact Information Primary Emergency Contact: Kathline Magic, Rosezella Rumpf of [place] Phone: [phone] Relation: Daughter Secondary Emergency Contact: Johnnette Gourd [place] of [place] Phone: [phone] Mobile Phone: [phone] Relation: Son  Code Status:  DNR Goals of care: Advanced Directive information Advanced Directives 12/04/2016  Does Patient Have a Medical Advance Directive? Yes  Type of Paramedic of [place];Living will;Out of facility DNR (pink MOST or yellow form)  Does patient want to make changes to medical advance directive? No - Patient declined  Copy of [place] in Chart? Yes  Pre-existing out of facility DNR order (yellow form or pink MOST form) -     Chief Complaint  Patient presents with  . Medical Management of Chronic Issues    HPI:  Pt is a 82 y.o. male seen today for medical management of chronic diseases.    Hx of Afib, heart rate is in control, dementia, depression, stable, HTN, controlled while on furosemide 20mg  and Spironolactone 25mg , anemia, Hgb 7.6, dropped from 11.4 12/22/15, repeated Hgb 7.3 01/09/16, improved, 05/31/16 Hgb 14.2, off Iron, ASA/Elliquis dc'd, CHF BNP 1449.  compensated on Furosemide 20mg  and Spironolactone 25mg  daily.    Past Medical History:  Diagnosis Date  . ANEMIA-IRON DEFICIENCY 05/27/2007  .  ANXIETY 11/14/2007  . BENIGN PROSTATIC HYPERTROPHY 11/14/2007  . CAROTID ARTERY DISEASE 10/02/2010  . Chronic combined systolic and diastolic CHF (congestive heart failure) ([place]) 10/10/2014  . CKD stage 3 due to type 1 diabetes mellitus ([place]) 03/17/2015  . COLONIC POLYPS, HX OF 05/27/2007  . CORONARY ARTERY DISEASE 11/14/2007   prior bypass [id number]; Inferior MI 11/07; Severe 3 vessel disease; occluded SVG to RCA; SVG to OM subtotalled, SVG to diagonal with ostial 70 and 80 mid; EF 45. PCI of SVG to OM. FU myovue showed EF 56 with inferior infarct and very mild peri-infarct ischemia  . DEPRESSION 11/14/2007  . DIABETES MELLITUS, TYPE II 11/14/2007  . DIVERTICULITIS, HX OF 05/27/2007  . DM (diabetes mellitus), type 2 with renal complications ([place]) [id number]  . DVT (deep venous thrombosis) ([place]) 03/21/2011  . Dyspnea 11/09/2014  . Effusion of left knee 03/17/2015  . Failure to thrive in adult 05/03/2015  . FOOT PAIN, BILATERAL 12/08/2010  . Generalized weakness 05/20/2015  . GERD 11/14/2007  . Hearing loss 05/13/2009   Qualifier: Diagnosis of  By: Jenny Reichmann MD, Hunt Oris   . Hyperlipemia 11/14/2007   Qualifier: Diagnosis of  By: Jenny Reichmann MD, Hunt Oris    . HYPERLIPIDEMIA 11/14/2007  . HYPERTENSION 11/14/2007  . HYPOTENSION, ORTHOSTATIC 03/06/2011  . Intermittent high-grade heart block 09/05/2009   s/p pacemaker  . Ischemic cardiomyopathy    a. echo (10/15):  mod focal basal septal hypertrophy, EF 40-45%, inf AK, Gr 1 DD, Al sclerosis, mild AI, MAC, mild MR, mod LAE, lipomatous hypertrophy, small effusion (no hemodynamic compromise)   . Memory loss 06/01/2008  . Myalgia and myositis,  unspecified 04/24/2011  . Osteoarthrosis, unspecified whether generalized or localized, unspecified site   . OSTEOPOROSIS 06/02/2010  . Other malaise and fatigue 04/24/2011  . Pacemaker-St. Jude 02/02/2008   St. Jude  . Peripheral neuropathy ([place]) 03/27/2011  . PERIPHERAL VASCULAR DISEASE 11/14/2007  . Prostate cancer ([place])   .  Protein-calorie malnutrition, severe ([place]) 05/23/2015  . RENAL INSUFFICIENCY 11/14/2007  . Seborrheic keratosis 06/05/2011  . SECONDARY DM W/PERIPHERAL CIRC D/O UNCONTROLLED 11/07/2010  . Severe back pain   . SYNCOPE 09/08/2008  . Unspecified hearing loss 05/13/2009  . Vertebral compression fracture ([place]) 09/05/2009   Qualifier: Diagnosis of  By: Jenny Reichmann MD, Hunt Oris   . [name] 04/04/2015  . Weight loss 10/10/2014   Past Surgical History:  Procedure Laterality Date  . APPENDECTOMY    . CHOLECYSTECTOMY    . CORONARY ARTERY BYPASS GRAFT  2007  . CYSTOSCOPY    . PACEMAKER INSERTION  2012   Caryl Comes, MD  . prostate needle biopsy    . s/p bilat knee replacement  1992   Paul, MD  . s/p coronary stent x 1      Allergies  Allergen Reactions  . Hydrocodone     hallucination  . Tramadol     hallucination      Medication List       Accurate as of 12/04/16  4:27 PM. Always use your most recent med list.          BOOST DIABETIC Liqd Take 237 mLs by mouth 3 (three) times daily between meals.   docusate sodium 100 MG capsule Commonly known as:  COLACE Take 100 mg by mouth 2 (two) times daily.   famotidine 20 MG tablet Commonly known as:  PEPCID Take 20 mg by mouth at bedtime. For GERD   feeding supplement (PRO-STAT SUGAR FREE 64) Liqd Take 30 mLs by mouth 3 (three) times daily with meals.   fentaNYL 25 MCG/HR patch Commonly known as:  DURAGESIC Apply fresh patch every 3 days to help pain. Remove old patch.   furosemide 20 MG tablet Commonly known as:  LASIX Take 10 mg by mouth daily.   levothyroxine 25 MCG tablet Commonly known as:  SYNTHROID, LEVOTHROID Take 25 mcg by mouth daily.   nitroGLYCERIN 0.4 MG SL tablet Commonly known as:  NITROSTAT Place 0.4 mg under the tongue every 5 (five) minutes as needed for chest pain.   OXYGEN Inhale 4 L/min into the lungs as needed.   spironolactone 25 MG tablet Commonly known as:  ALDACTONE Take 12.5 mg by mouth daily.   SYSTANE  BALANCE 0.6 % Soln Generic drug:  Propylene Glycol Apply to eye. One drop both eyes 2 times a day due to dry eyes   zinc oxide 20 % ointment Apply 1 application topically. Apply ointment as needed to buttocks       Review of Systems  Constitutional: Negative for chills and fever.  HENT: Positive for hearing loss. Negative for ear pain.   Eyes: Negative for photophobia and pain.       Corrective lenses  Respiratory: Positive for shortness of breath. Negative for cough and wheezing.        O2 dependent  Cardiovascular: Positive for leg swelling.       Pacemaker. History of syncope related to cardiac arrhythmia. BLE edema trace R>L  Gastrointestinal: Negative for abdominal pain.  Endocrine: Negative for polydipsia.  Genitourinary: Positive for frequency and urgency. Negative for dysuria and flank pain.  Underwent Urology evaluation in the past, off meds. Episodes of incontinence persist.  Musculoskeletal: Positive for back pain, gait problem, myalgias and neck pain (Fall on 03/15/15 causing pain in the left knee and effusion.).       Poor balance and gait disturbance. Lower back pain Sever kyphosis  Skin:       Rough area on left cheek. Spontaneous ecchymoses in all extremities.     Neurological: Negative for tremors, seizures and weakness.       Memory loss  Psychiatric/Behavioral:       Mild to moderate memory disturbance.    Immunization History  Administered Date(s) Administered  . Influenza Whole 09/23/2010, 09/23/2012  . Influenza,inj,Quad PF,36+ Mos 10/09/2014  . Influenza-Unspecified 09/10/2013, 09/30/2015, 10/05/2016  . PPD Test 05/23/2015  . Pneumococcal Polysaccharide-23 03/31/2009  . Td 03/31/2009   Pertinent  Health Maintenance Due  Topic Date Due  . OPHTHALMOLOGY EXAM  06/26/1933  . PNA vac Low Risk Adult (2 of 2 - PCV13) 03/31/2010  . URINE MICROALBUMIN  06/03/2011  . HEMOGLOBIN A1C  12/06/2015  . FOOT EXAM  03/16/2016  . INFLUENZA VACCINE   Completed   Fall Risk  09/26/2015 05/26/2015 04/29/2015 04/04/2015 03/17/2015  Falls in the past year? Yes Yes Yes Yes Yes  Number falls in past yr: 1 1 1 1 2  or more  Injury with Fall? No Yes Yes Yes Yes  Risk Factor Category  - High Fall Risk - High Fall Risk High Fall Risk  Risk for fall due to : Impaired balance/gait History of fall(s);Impaired balance/gait;Impaired mobility - History of fall(s);Impaired balance/gait;Impaired mobility History of fall(s);Impaired balance/gait;Impaired mobility  Follow up Falls evaluation completed Falls evaluation completed - Falls evaluation completed Falls evaluation completed   Functional Status Survey:    Vitals:   12/04/16 1501  BP: (!) 142/84  Pulse: 70  Resp: 19  Temp: 97.8 F (36.6 C)  Weight: 158 lb (71.7 kg)  Height: 5\' 10"  (1.778 m)   Body mass index is 22.67 kg/m. Physical Exam  Constitutional: He is oriented to person, place, and time.  Frail  HENT:  Head: Normocephalic and atraumatic.  Right Ear: No drainage, swelling or tenderness. No foreign bodies. No mastoid tenderness. Tympanic membrane is not injected, not scarred, not perforated, not erythematous, not retracted and not bulging. Tympanic membrane mobility is normal. No hemotympanum. Decreased hearing is noted.  Left Ear: No drainage, swelling or tenderness. No foreign bodies. No mastoid tenderness. Tympanic membrane is not injected, not scarred, not perforated, not erythematous, not retracted and not bulging. Tympanic membrane mobility is normal. No hemotympanum. Decreased hearing is noted.  Nose: Nose normal.  Bilateral hearing aids.  Eyes: Conjunctivae are normal. Pupils are equal, round, and reactive to light.  Prescription lenses.  Neck: Normal range of motion. Neck supple. No JVD present. No thyromegaly present.  Cardiovascular: Normal rate and regular rhythm.  Exam reveals no gallop and no friction rub.   Murmur heard.  Systolic murmur is present with a grade of 1/6    Absent DP and PT  Pulmonary/Chest: Effort normal. He has no wheezes. He has rales. He exhibits no tenderness.  Using portable oxygen at 2 L/m today. Moist rales posterior mid to lower lungs.   Abdominal: Soft. Bowel sounds are normal. He exhibits no distension. There is no tenderness.  Musculoskeletal: Normal range of motion. He exhibits edema. He exhibits no tenderness.  Pain in the left SI joint area. Limps when walking. Favors the left side. Effusion of  the left knee. Scars in the left knee from previous surgery. Tender at the left knee with movement of the patella. Positional lower back pain Server kyphosis RLE edema trace  Neurological: He is alert and oriented to person, place, and time. He has normal strength. A sensory deficit is present. No cranial nerve deficit. Gait abnormal.  07/13/14 MMSE 28/30. Passed clock drawing. Diminshed sensation to vabration and monofilament.  Skin: Skin is warm and dry. No erythema. No pallor.  spontaneous ecchymoses in all extremities.      Psychiatric: He has a normal mood and affect. His behavior is normal. Thought content normal.  Patient is in denial regarding the extent of his memory deficit.    Labs reviewed:  Recent Labs  03/06/16 1448 03/29/16 11/01/16  NA 140 137 139  K 4.4 4.5 4.4  CL 101  --   --   CO2 29  --   --   GLUCOSE 98  --   --   BUN 45* 43* 48*  CREATININE 1.15* 1.1 1.2  CALCIUM 9.6  --   --     Recent Labs  02/09/16 03/29/16 11/01/16  AST 25 19 22   ALT 15 12 14   ALKPHOS 210* 131* 106    Recent Labs  03/22/16 05/31/16 11/01/16  WBC 5.4 6.6 5.6  HGB 12.9* 14.2 14.4  HCT 40* 42 43  PLT 143* 172 124*   Lab Results  Component Value Date   TSH 6.09 (A) 11/01/2016   Lab Results  Component Value Date   HGBA1C 6.7 (A) 06/06/2015   Lab Results  Component Value Date   CHOL 132 02/07/2015   HDL 33 (A) 02/07/2015   LDLCALC 76 02/07/2015   LDLDIRECT 85.6 07/19/2010   TRIG 115 02/07/2015   CHOLHDL 4  03/06/2011    Significant Diagnostic Results in last 30 days:  No results found.  Assessment/Plan Essential hypertension Controlled, continue Furosemide 20mg  and Spironolactone 25mg  daily, 11/01/16 wbc 5.6, Hgb 14.4, plt 124, Na 139, K 4.4, Bun 48, creat 1.16, TSH 6.09   Hypothyroidism 11/01/16 TSH 6.09 11/02/16 Levothyroxine 44mcg, TSH 12 weeks.    Chronic combined systolic and diastolic CHF (congestive heart failure) (H[facility]) 01/13/16 Furosemide 20mg , Spironolactone 25mg .  02/09/16 BNP 1959.3 05/22/16 Na 137, K 4.0, Bun 57, creat 1.15, BNP 993.7 11/01/16 wbc 5.6, Hgb 14.4, plt 124, Na 139, K 4.4, Bun 48, creat 1.16, TSH 6.09 Compensating clinically.     Paroxysmal atrial fibrillation ([facility]) Rate is in control w/o medications. off Eliquis 2nd to Hgb 7s.    GERD Stable, continue Omeprazole 20mg  daily,  Hgb dropped from 11.4 12/12/15 to 7.6 01/02/16, resolved, Hgb 14.3 05/31/16, 14.4 11/01/16, continue to be off ASA/Elliquis/Fe    Constipation Stable, continue 06/10/15 Lactulose prn. Colace bid  DM type 2 with diabetic peripheral neuropathy 06/06/15 Hgb A1c 6.7 diet control  CKD stage 3 due to type 1 diabetes mellitus ([place]) 11/01/16 Na 139, K 4.4, Bun 48, creat 1.16  Backache Controlled, continue Fentanyl transdermal patch  Memory loss Progressing. off Namenda R>B. SNF for care needs. Comfort measures.        Family/ staff Communication: SNF  Labs/tests ordered:  none

## 2016-12-04 NOTE — Assessment & Plan Note (Signed)
Rate is in control w/o medications. off Eliquis 2nd to Hgb 7s.  

## 2016-12-04 NOTE — Assessment & Plan Note (Signed)
06/06/15 Hgb A1c 6.7 diet control.   

## 2016-12-28 DIAGNOSIS — L602 Onychogryphosis: Secondary | ICD-10-CM | POA: Diagnosis not present

## 2016-12-28 DIAGNOSIS — E1159 Type 2 diabetes mellitus with other circulatory complications: Secondary | ICD-10-CM | POA: Diagnosis not present

## 2016-12-28 DIAGNOSIS — L84 Corns and callosities: Secondary | ICD-10-CM | POA: Diagnosis not present

## 2016-12-28 LAB — HM DIABETES FOOT EXAM

## 2017-01-04 ENCOUNTER — Encounter: Payer: Self-pay | Admitting: Nurse Practitioner

## 2017-01-04 ENCOUNTER — Non-Acute Institutional Stay (SKILLED_NURSING_FACILITY): Payer: Medicare Other | Admitting: Nurse Practitioner

## 2017-01-04 DIAGNOSIS — E039 Hypothyroidism, unspecified: Secondary | ICD-10-CM | POA: Diagnosis not present

## 2017-01-04 DIAGNOSIS — D500 Iron deficiency anemia secondary to blood loss (chronic): Secondary | ICD-10-CM

## 2017-01-04 DIAGNOSIS — I480 Paroxysmal atrial fibrillation: Secondary | ICD-10-CM

## 2017-01-04 DIAGNOSIS — R413 Other amnesia: Secondary | ICD-10-CM

## 2017-01-04 DIAGNOSIS — N183 Chronic kidney disease, stage 3 unspecified: Secondary | ICD-10-CM

## 2017-01-04 DIAGNOSIS — I10 Essential (primary) hypertension: Secondary | ICD-10-CM | POA: Diagnosis not present

## 2017-01-04 DIAGNOSIS — I739 Peripheral vascular disease, unspecified: Secondary | ICD-10-CM

## 2017-01-04 DIAGNOSIS — R339 Retention of urine, unspecified: Secondary | ICD-10-CM

## 2017-01-04 DIAGNOSIS — I5042 Chronic combined systolic (congestive) and diastolic (congestive) heart failure: Secondary | ICD-10-CM | POA: Diagnosis not present

## 2017-01-04 DIAGNOSIS — E1022 Type 1 diabetes mellitus with diabetic chronic kidney disease: Secondary | ICD-10-CM | POA: Diagnosis not present

## 2017-01-04 DIAGNOSIS — K5900 Constipation, unspecified: Secondary | ICD-10-CM | POA: Diagnosis not present

## 2017-01-04 DIAGNOSIS — M4850XS Collapsed vertebra, not elsewhere classified, site unspecified, sequela of fracture: Secondary | ICD-10-CM

## 2017-01-04 DIAGNOSIS — K219 Gastro-esophageal reflux disease without esophagitis: Secondary | ICD-10-CM

## 2017-01-04 DIAGNOSIS — Z961 Presence of intraocular lens: Secondary | ICD-10-CM | POA: Diagnosis not present

## 2017-01-04 DIAGNOSIS — E1142 Type 2 diabetes mellitus with diabetic polyneuropathy: Secondary | ICD-10-CM

## 2017-01-04 DIAGNOSIS — IMO0001 Reserved for inherently not codable concepts without codable children: Secondary | ICD-10-CM

## 2017-01-04 NOTE — Progress Notes (Signed)
Location:  [facility] Room Number: 9 Place of Service:  SNF (31) Provider:  Leonor Darnell, [place]  NP  Jeanmarie Hubert, MD  Patient Care Team: Estill Dooms, MD as PCP - General (Internal Medicine) Irine Seal, MD as Consulting Physician (Urology) Renella Cunas, MD (Inactive) as Consulting Physician (Cardiology) [facility] Lelon Perla, MD as Consulting Physician (Cardiology) Dempsey Knotek Otho Darner, NP as Nurse Practitioner (Nurse Practitioner) Rigoberto Noel, MD as Consulting Physician (Pulmonary Disease)  Extended Emergency Contact Information Primary Emergency Contact: Kathline Magic, Rosezella Rumpf of [place] Phone: [phone] Relation: Daughter Secondary Emergency Contact: Johnnette Gourd [place] of [place] Phone: [phone] Mobile Phone: [phone] Relation: Son  Code Status:  DNR Goals of care: Advanced Directive information Advanced Directives 01/04/2017  Does Patient Have a Medical Advance Directive? Yes  Type of Paramedic of [place];Out of facility DNR (pink MOST or yellow form);Living will  Does patient want to make changes to medical advance directive? No - Patient declined  Copy of [place] in Chart? Yes  Pre-existing out of facility DNR order (yellow form or pink MOST form) -     Chief Complaint  Patient presents with  . Medical Management of Chronic Issues    HPI:  Pt is a 83 y.o. male seen today for medical management of chronic diseases.     Hx of Afib, heart rate is in control, dementia, depression, stable, HTN, controlled while on furosemide 7m and Spironolactone 12.526m anemia, Hgb 7.6, dropped from 11.4 12/22/15, last Hgb 14.4 11/11/16, off Iron, ASA/Elliquis dc'd, CHF BNP 1449.  compensated on Furosemide 1016mnd Spironolactone 12.5mg29mily. Hypothyroidism, taking Levothyroxine 25mc71mast TSH 6.09 11/01/16 Past Medical History:  Diagnosis Date  .  ANEMIA-IRON DEFICIENCY 05/27/2007  . ANXIETY 11/14/2007  . BENIGN PROSTATIC HYPERTROPHY 11/14/2007  . CAROTID ARTERY DISEASE 10/02/2010  . Chronic combined systolic and diastolic CHF (congestive heart failure) (HCC) [place]18/2015  . CKD stage 3 due to type 1 diabetes mellitus (HCC) [place]4/2016  . COLONIC POLYPS, HX OF 05/27/2007  . CORONARY ARTERY DISEASE 11/14/2007   prior bypass [phone] MI 11/07; Severe 3 vessel disease; occluded SVG to RCA; SVG to OM subtotalled, SVG to diagonal with ostial 70 and 80 mid; EF 45. PCI of SVG to OM. FU myovue showed EF 56 with inferior infarct and very mild peri-infarct ischemia  . DEPRESSION 11/14/2007  . DIABETES MELLITUS, TYPE II 11/14/2007  . DIVERTICULITIS, HX OF 05/27/2007  . DM (diabetes mellitus), type 2 with renal complications (HCC) [place]7/7/12/4580VT (deep venous thrombosis) (HCC) [place]8/2012  . Dyspnea 11/09/2014  . Effusion of left knee 03/17/2015  . Failure to thrive in adult 05/03/2015  . FOOT PAIN, BILATERAL 12/08/2010  . Generalized weakness 05/20/2015  . GERD 11/14/2007  . Hearing loss 05/13/2009   Qualifier: Diagnosis of  By: John Jenny ReichmannJamesHunt OrisHyperlipemia 11/14/2007   Qualifier: Diagnosis of  By: John Jenny ReichmannJamesHunt Oris HYPERLIPIDEMIA 11/14/2007  . HYPERTENSION 11/14/2007  . HYPOTENSION, ORTHOSTATIC 03/06/2011  . Intermittent high-grade heart block 09/05/2009   s/p pacemaker  . Ischemic cardiomyopathy    a. echo (10/15):  mod focal basal septal hypertrophy, EF 40-45%, inf AK, Gr 1 DD, Al sclerosis, mild AI, MAC, mild MR, mod LAE, lipomatous hypertrophy, small effusion (no hemodynamic compromise)   . Memory loss 06/01/2008  . Myalgia  and myositis, unspecified 04/24/2011  . Osteoarthrosis, unspecified whether generalized or localized, unspecified site   . OSTEOPOROSIS 06/02/2010  . Other malaise and fatigue 04/24/2011  . Pacemaker-St. Jude 02/02/2008   St. Jude  . Peripheral neuropathy ([place]) 03/27/2011  . PERIPHERAL VASCULAR DISEASE 11/14/2007   . Prostate cancer ([place])   . Protein-calorie malnutrition, severe ([place]) 05/23/2015  . RENAL INSUFFICIENCY 11/14/2007  . Seborrheic keratosis 06/05/2011  . SECONDARY DM W/PERIPHERAL CIRC D/O UNCONTROLLED 11/07/2010  . Severe back pain   . SYNCOPE 09/08/2008  . Unspecified hearing loss 05/13/2009  . Vertebral compression fracture ([place]) 09/05/2009   Qualifier: Diagnosis of  By: Jenny Reichmann MD, Hunt Oris   . Zachary Harris 04/04/2015  . Weight loss 10/10/2014   Past Surgical History:  Procedure Laterality Date  . APPENDECTOMY    . CHOLECYSTECTOMY    . CORONARY ARTERY BYPASS GRAFT  2007  . CYSTOSCOPY    . PACEMAKER INSERTION  2012   Caryl Comes, MD  . prostate needle biopsy    . s/p bilat knee replacement  1992   Paul, MD  . s/p coronary stent x 1      Allergies  Allergen Reactions  . Hydrocodone     hallucination  . Tramadol     hallucination    Allergies as of 01/04/2017      Reactions   Hydrocodone    hallucination   Tramadol    hallucination      Medication List       Accurate as of 01/04/17 11:59 PM. Always use your most recent med list.          BOOST DIABETIC Liqd Take 237 mLs by mouth 3 (three) times daily between meals.   docusate sodium 100 MG capsule Commonly known as:  COLACE Take 100 mg by mouth 2 (two) times daily.   famotidine 20 MG tablet Commonly known as:  PEPCID Take 20 mg by mouth at bedtime. For GERD   feeding supplement (PRO-STAT SUGAR FREE 64) Liqd Take 30 mLs by mouth 3 (three) times daily with meals.   fentaNYL 25 MCG/HR patch Commonly known as:  DURAGESIC Apply fresh patch every 3 days to help pain. Remove old patch.   furosemide 20 MG tablet Commonly known as:  LASIX Take 10 mg by mouth daily.   levothyroxine 25 MCG tablet Commonly known as:  SYNTHROID, LEVOTHROID Take 25 mcg by mouth daily.   nitroGLYCERIN 0.4 MG SL tablet Commonly known as:  NITROSTAT Place 0.4 mg under the tongue every 5 (five) minutes as needed for chest pain.    OXYGEN Inhale 1 L/min into the lungs as needed.   spironolactone 25 MG tablet Commonly known as:  ALDACTONE Take 12.5 mg by mouth daily.   SYSTANE BALANCE 0.6 % Soln Generic drug:  Propylene Glycol Apply to eye. One drop both eyes 2 times a day due to dry eyes   zinc oxide 20 % ointment Apply 1 application topically. Apply ointment as needed to buttocks       Review of Systems  Constitutional: Negative for chills and fever.  HENT: Positive for hearing loss. Negative for ear pain.   Eyes: Negative for photophobia and pain.       Corrective lenses  Respiratory: Positive for shortness of breath. Negative for cough and wheezing.        O2 dependent  Cardiovascular: Positive for leg swelling.       Pacemaker. History of syncope related to cardiac arrhythmia. BLE edema trace R>L  Gastrointestinal: Negative for abdominal pain.  Endocrine: Negative for polydipsia.  Genitourinary: Positive for frequency and urgency. Negative for dysuria and flank pain.       Underwent Urology evaluation in the past, off meds. Episodes of incontinence persist.  Musculoskeletal: Positive for back pain, gait problem, myalgias and neck pain (Fall on 03/15/15 causing pain in the left knee and effusion.).       Poor balance and gait disturbance. Lower back pain Sever kyphosis  Skin:       Rough area on left cheek. Spontaneous ecchymoses in all extremities.     Neurological: Negative for tremors, seizures and weakness.       Memory loss  Psychiatric/Behavioral:       Mild to moderate memory disturbance.    Immunization History  Administered Date(s) Administered  . Influenza Whole 09/23/2010, 09/23/2012  . Influenza,inj,Quad PF,36+ Mos 10/09/2014  . Influenza-Unspecified 09/10/2013, 09/30/2015, 10/05/2016  . PPD Test 05/23/2015  . Pneumococcal Polysaccharide-23 03/31/2009  . Td 03/31/2009   Pertinent  Health Maintenance Due  Topic Date Due  . OPHTHALMOLOGY EXAM  06/26/1933  . PNA vac Low Risk  Adult (2 of 2 - PCV13) 03/31/2010  . URINE MICROALBUMIN  06/03/2011  . HEMOGLOBIN A1C  12/06/2015  . FOOT EXAM  03/16/2016  . INFLUENZA VACCINE  Completed   Fall Risk  09/26/2015 05/26/2015 04/29/2015 04/04/2015 03/17/2015  Falls in the past year? _0   Number falls in past yr: _1 or more  Injury with Fall? No Yes Yes Yes Yes  Risk Factor Category  - High Fall Risk - High Fall Risk High Fall Risk  Risk for fall due to : Impaired balance/gait History of fall(s);Impaired balance/gait;Impaired mobility - History of fall(s);Impaired balance/gait;Impaired mobility History of fall(s);Impaired balance/gait;Impaired mobility  Follow up Falls evaluation completed Falls evaluation completed - Falls evaluation completed Falls evaluation completed   Functional Status Survey:    Vitals:   01/04/17 1306  BP: 110/80  Pulse: 76  Resp: (!) 22  Temp: 98 F (36.7 C)  SpO2: 95%  Weight: 168 lb (76.2 kg)  Height: _2  (1.778 m)   Body mass index is 24.11 kg/m. Physical Exam  Constitutional: He is oriented to person, place, and time.  Frail  HENT:  Head: Normocephalic and atraumatic.  Right Ear: No drainage, swelling or tenderness. No foreign bodies. No mastoid tenderness. Tympanic membrane is not injected, not scarred, not perforated, not erythematous, not retracted and not bulging. Tympanic membrane mobility is normal. No hemotympanum. Decreased hearing is noted.  Left Ear: No drainage, swelling or tenderness. No foreign bodies. No mastoid tenderness. Tympanic membrane is not injected, not scarred, not perforated, not erythematous, not retracted and not bulging. Tympanic membrane mobility is normal. No hemotympanum. Decreased hearing is noted.  Nose: Nose normal.  Bilateral hearing aids.  Eyes: Conjunctivae are normal. Pupils are equal, round, and reactive to light.  Prescription lenses.  Neck: Normal range of motion. Neck supple. No JVD present. No thyromegaly present.   Cardiovascular: Normal rate and regular rhythm.  Exam reveals no gallop and no friction rub.   Murmur heard.  Systolic murmur is present with a grade of 1/6  Absent DP and PT  Pulmonary/Chest: Effort normal. He has no wheezes. He has rales. He exhibits no tenderness.  Using portable oxygen at 2 L/m today. Moist rales posterior mid to lower lungs.   Abdominal: Soft. Bowel sounds are normal. He exhibits no distension. There is no  tenderness.  Musculoskeletal: Normal range of motion. He exhibits edema. He exhibits no tenderness.  Pain in the left SI joint area. Limps when walking. Favors the left side. Effusion of the left knee. Scars in the left knee from previous surgery. Tender at the left knee with movement of the patella. Positional lower back pain Server kyphosis RLE edema trace  Neurological: He is alert and oriented to person, place, and time. He has normal strength. A sensory deficit is present. No cranial nerve deficit. Gait abnormal.  07/13/14 MMSE 28/30. Passed clock drawing. Diminshed sensation to vabration and monofilament.  Skin: Skin is warm and dry. No erythema. No pallor.  spontaneous ecchymoses in all extremities.      Psychiatric: He has a normal mood and affect. His behavior is normal. Thought content normal.  Patient is in denial regarding the extent of his memory deficit.    Labs reviewed:  Recent Labs  03/06/16 1448 03/29/16 11/01/16  NA 140 137 139  K 4.4 4.5 4.4  CL 101  --   --   CO2 29  --   --   GLUCOSE 98  --   --   BUN 45* 43* 48*  CREATININE 1.15* 1.1 1.2  CALCIUM 9.6  --   --     Recent Labs  02/09/16 03/29/16 11/01/16  AST _0 ALT _1 ALKPHOS 210* 131* 106    Recent Labs  03/22/16 05/31/16 11/01/16  WBC 5.4 6.6 5.6  HGB 12.9* 14.2 14.4  HCT 40* 42 43  PLT 143* 172 124*   Lab Results  Component Value Date   TSH 6.09 (A) 11/01/2016   Lab Results  Component Value Date   HGBA1C 6.7 (A) 06/06/2015   Lab Results   Component Value Date   CHOL 132 02/07/2015   HDL 33 (A) 02/07/2015   LDLCALC 76 02/07/2015   LDLDIRECT 85.6 07/19/2010   TRIG 115 02/07/2015   CHOLHDL 4 03/06/2011    Significant Diagnostic Results in last 30 days:  No results found.  Assessment/Plan Essential hypertension Controlled, continue Furosemide 30m and Spironolactone 12.5104mdaily, 11/01/16 wbc 5.6, Hgb 14.4, plt 124, Na 139, K 4.4, Bun 48, creat 1.16, TSH 6.09   Peripheral vascular disease Stable, chronic mild venous dermatitis BLE   Chronic combined systolic and diastolic CHF (congestive heart failure) (HCC) Continue Furosemide 1064mSpironolactone 12.5mg24m2/16/17 BNP 1959.3 05/22/16 Na 137, K 4.0, Bun 57, creat 1.15, BNP 993.7 11/01/16 wbc 5.6, Hgb 14.4, plt 124, Na 139, K 4.4, Bun 48, creat 1.16, TSH 6.09 Compensating clinically.     Paroxysmal atrial fibrillation (HCC) Rate is in control w/o medications. off Eliquis 2nd to Hgb 7s.    GERD Stable, continue Omeprazole 20mg40mly,  Hgb dropped from 11.4 12/12/15 to 7.6 01/02/16, resolved, Hgb 14.4 11/01/16, continue to be off ASA/Elliquis/Fe    Constipation Stable, continue 06/10/15 Lactulose prn. Colace bid  DM type 2 with diabetic peripheral neuropathy 06/06/15 Hgb A1c 6.7 diet control  CKD stage 3 due to type 1 diabetes mellitus (HCC) [place]9/17 Na 139, K 4.4, Bun 48, creat 1.16  Hypothyroidism 11/01/16 TSH 6.09 11/02/16 Levothyroxine 25mcg24mH 12 weeks.    Urine retention Urinary leakage  Iron deficiency anemia Off Iron, last Hgb 14.4  Memory loss Progressing. off Namenda R>B. SNF for care needs. Comfort measures.     Vertebral compression fracture Controlled, continue Fentanyl transdermal patch     Family/ staff Communication: SNF  Labs/tests ordered:  none

## 2017-01-06 NOTE — Assessment & Plan Note (Signed)
Continue Furosemide 10mg , Spironolactone 12.5mg .  02/09/16 BNP 1959.3 05/22/16 Na 137, K 4.0, Bun 57, creat 1.15, BNP 993.7 11/01/16 wbc 5.6, Hgb 14.4, plt 124, Na 139, K 4.4, Bun 48, creat 1.16, TSH 6.09 Compensating clinically.

## 2017-01-06 NOTE — Assessment & Plan Note (Signed)
Stable, continue 06/10/15 Lactulose prn. Colace bid.  

## 2017-01-06 NOTE — Assessment & Plan Note (Signed)
Stable, chronic mild venous dermatitis BLE

## 2017-01-06 NOTE — Assessment & Plan Note (Signed)
06/06/15 Hgb A1c 6.7 diet control.   

## 2017-01-06 NOTE — Assessment & Plan Note (Signed)
Stable, continue Omeprazole 20mg  daily,  Hgb dropped from 11.4 12/12/15 to 7.6 01/02/16, resolved, Hgb 14.4 11/01/16, continue to be off ASA/Elliquis/Fe

## 2017-01-06 NOTE — Assessment & Plan Note (Signed)
Off Iron, last Hgb 14.4

## 2017-01-06 NOTE — Assessment & Plan Note (Signed)
Rate is in control w/o medications. off Eliquis 2nd to Hgb 7s.  

## 2017-01-06 NOTE — Assessment & Plan Note (Signed)
Controlled, continue Furosemide 10mg  and Spironolactone 12.5mg  daily, 11/01/16 wbc 5.6, Hgb 14.4, plt 124, Na 139, K 4.4, Bun 48, creat 1.16, TSH 6.09

## 2017-01-06 NOTE — Assessment & Plan Note (Signed)
Progressing. off Namenda R>B. SNF for care needs. Comfort measures.   

## 2017-01-06 NOTE — Assessment & Plan Note (Signed)
Urinary leakage.  

## 2017-01-06 NOTE — Assessment & Plan Note (Signed)
11/01/16 TSH 6.09 11/02/16 Levothyroxine 93mcg, TSH 12 weeks.

## 2017-01-06 NOTE — Assessment & Plan Note (Signed)
11/01/16 Na 139, K 4.4, Bun 48, creat 1.16

## 2017-01-06 NOTE — Assessment & Plan Note (Signed)
Controlled, continue Fentanyl transdermal patch  

## 2017-01-21 DIAGNOSIS — E039 Hypothyroidism, unspecified: Secondary | ICD-10-CM | POA: Diagnosis not present

## 2017-01-21 LAB — TSH: TSH: 4.27 u[IU]/mL (ref <=5.90)

## 2017-01-22 ENCOUNTER — Other Ambulatory Visit: Payer: Self-pay | Admitting: *Deleted

## 2017-01-28 ENCOUNTER — Encounter: Payer: Self-pay | Admitting: Internal Medicine

## 2017-01-28 ENCOUNTER — Non-Acute Institutional Stay (SKILLED_NURSING_FACILITY): Payer: Medicare Other | Admitting: Internal Medicine

## 2017-01-28 DIAGNOSIS — D500 Iron deficiency anemia secondary to blood loss (chronic): Secondary | ICD-10-CM | POA: Diagnosis not present

## 2017-01-28 DIAGNOSIS — N183 Chronic kidney disease, stage 3 unspecified: Secondary | ICD-10-CM

## 2017-01-28 DIAGNOSIS — E1121 Type 2 diabetes mellitus with diabetic nephropathy: Secondary | ICD-10-CM | POA: Diagnosis not present

## 2017-01-28 DIAGNOSIS — R0602 Shortness of breath: Secondary | ICD-10-CM | POA: Diagnosis not present

## 2017-01-28 DIAGNOSIS — I5042 Chronic combined systolic (congestive) and diastolic (congestive) heart failure: Secondary | ICD-10-CM

## 2017-01-28 DIAGNOSIS — I255 Ischemic cardiomyopathy: Secondary | ICD-10-CM | POA: Diagnosis not present

## 2017-01-28 DIAGNOSIS — E1022 Type 1 diabetes mellitus with diabetic chronic kidney disease: Secondary | ICD-10-CM | POA: Diagnosis not present

## 2017-01-28 NOTE — Progress Notes (Signed)
Progress Note    Location:  [facility] Room Number: N9 Place of Service:  SNF [phone]) Provider:  Jeanmarie Hubert, MD  Patient Care Team: Estill Dooms, MD as PCP - General (Internal Medicine) Irine Seal, MD as Consulting Physician (Urology) Renella Cunas, MD (Inactive) as Consulting Physician (Cardiology) [facility] Lelon Perla, MD as Consulting Physician (Cardiology) Man Otho Darner, NP as Nurse Practitioner (Nurse Practitioner) Rigoberto Noel, MD as Consulting Physician (Pulmonary Disease)  Extended Emergency Contact Information Primary Emergency Contact: Kathline Magic, Rosezella Rumpf of [place] Phone: [phone] Relation: Daughter Secondary Emergency Contact: Johnnette Gourd [place] Phone: [phone] Mobile Phone: [phone] Relation: Son  Code Status:  DNR Goals of care: Advanced Directive information Advanced Directives 01/28/2017  Does Patient Have a Medical Advance Directive? Yes  Type of Paramedic of [place];Living will;Out of facility DNR (pink MOST or yellow form)  Does patient want to make changes to medical advance directive? -  Copy of [place] in Chart? Yes  Pre-existing out of facility DNR order (yellow form or pink MOST form) Yellow form placed in chart (order not valid for inpatient use);Pink MOST form placed in chart (order not valid for inpatient use)     Chief Complaint  Patient presents with  . Medical Management of Chronic Issues    routine visit    HPI:  Pt is a 83 y.o. male seen today for medical management of chronic diseases.    Shortness of breath -- some increase in dyspnea recently  Chronic combined systolic and diastolic CHF (congestive heart failure) (HCC) - chronic. Needs follow up BNP  CKD stage 3 due to type 1 diabetes mellitus (HCC) - stable  Type 2 diabetes mellitus with diabetic nephropathy, without  long-term current use of insulin (HCC) - controlled  Iron deficiency anemia due to chronic blood loss - resolved  Ischemic cardiomyopathy - chronic. May be associated with dysponea.     Past Medical History:  Diagnosis Date  . ANEMIA-IRON DEFICIENCY 05/27/2007  . ANXIETY 11/14/2007  . BENIGN PROSTATIC HYPERTROPHY 11/14/2007  . CAROTID ARTERY DISEASE 10/02/2010  . Chronic combined systolic and diastolic CHF (congestive heart failure) ([place]) 10/10/2014  . CKD stage 3 due to type 1 diabetes mellitus ([place]) 03/17/2015  . COLONIC POLYPS, HX OF 05/27/2007  . CORONARY ARTERY DISEASE 11/14/2007   prior bypass [phone]; Inferior MI 11/07; Severe 3 vessel disease; occluded SVG to RCA; SVG to OM subtotalled, SVG to diagonal with ostial 70 and 80 mid; EF 45. PCI of SVG to OM. FU myovue showed EF 56 with inferior infarct and very mild peri-infarct ischemia  . DEPRESSION 11/14/2007  . DIABETES MELLITUS, TYPE II 11/14/2007  . DIVERTICULITIS, HX OF 05/27/2007  . DM (diabetes mellitus), type 2 with renal complications ([place]) 2/54/2706  . DVT (deep venous thrombosis) ([place]) 03/21/2011  . Dyspnea 11/09/2014  . Effusion of left knee 03/17/2015  . Failure to thrive in adult 05/03/2015  . FOOT PAIN, BILATERAL 12/08/2010  . Generalized weakness 05/20/2015  . GERD 11/14/2007  . Hearing loss 05/13/2009   Qualifier: Diagnosis of  By: Jenny Reichmann MD, Hunt Oris   . Hyperlipemia 11/14/2007   Qualifier: Diagnosis of  By: Jenny Reichmann MD, Hunt Oris    . HYPERLIPIDEMIA 11/14/2007  . HYPERTENSION 11/14/2007  . HYPOTENSION, ORTHOSTATIC 03/06/2011  . Intermittent high-grade heart block 09/05/2009   s/p  pacemaker  . Ischemic cardiomyopathy    a. echo (10/15):  mod focal basal septal hypertrophy, EF 40-45%, inf AK, Gr 1 DD, Al sclerosis, mild AI, MAC, mild MR, mod LAE, lipomatous hypertrophy, small effusion (no hemodynamic compromise)   . Memory loss 06/01/2008  . Myalgia and myositis, unspecified 04/24/2011  . Osteoarthrosis, unspecified whether  generalized or localized, unspecified site   . OSTEOPOROSIS 06/02/2010  . Other malaise and fatigue 04/24/2011  . Pacemaker-St. Jude 02/02/2008   St. Jude  . Peripheral neuropathy ([place]) 03/27/2011  . PERIPHERAL VASCULAR DISEASE 11/14/2007  . Prostate cancer ([place])   . Protein-calorie malnutrition, severe ([place] ) 05/23/2015  . RENAL INSUFFICIENCY 11/14/2007  . Seborrheic keratosis 06/05/2011  . SECONDARY DM W/PERIPHERAL CIRC D/O UNCONTROLLED 11/07/2010  . Severe back pain   . SYNCOPE 09/08/2008  . Unspecified hearing loss 05/13/2009  . Vertebral compression fracture ([place]) 09/05/2009   Qualifier: Diagnosis of  By: Jenny Reichmann MD, Hunt Oris   . [name] 04/04/2015  . Weight loss 10/10/2014   Past Surgical History:  Procedure Laterality Date  . APPENDECTOMY    . CHOLECYSTECTOMY    . CORONARY ARTERY BYPASS GRAFT  2007  . CYSTOSCOPY    . PACEMAKER INSERTION  2012   Caryl Comes, MD  . prostate needle biopsy    . s/p bilat knee replacement  1992   Paul, MD  . s/p coronary stent x 1      Allergies  Allergen Reactions  . Hydrocodone     hallucination  . Tramadol     hallucination    Allergies as of 01/28/2017      Reactions   Hydrocodone    hallucination   Tramadol    hallucination      Medication List       Accurate as of 01/28/17  2:45 PM. Always use your most recent med list.          BOOST DIABETIC Liqd Take 237 mLs by mouth 3 (three) times daily between meals.   carbamide peroxide 6.5 % otic solution Commonly known as:  DEBROX 5 drops. Instil 5 - 10 drops in each ear at bedtime 3 times a week every other month   docusate sodium 100 MG capsule Commonly known as:  COLACE Take 100 mg by mouth 2 (two) times daily.   famotidine 20 MG tablet Commonly known as:  PEPCID Take 20 mg by mouth at bedtime. For GERD   feeding supplement (PRO-STAT SUGAR FREE 64) Liqd Take 30 mLs by mouth 3 (three) times daily with meals.   fentaNYL 25 MCG/HR patch Commonly known as:  DURAGESIC Apply fresh patch  every 3 days to help pain. Remove old patch.   furosemide 20 MG tablet Commonly known as:  LASIX Take 10 mg by mouth daily.   levothyroxine 25 MCG tablet Commonly known as:  SYNTHROID, LEVOTHROID Take 25 mcg by mouth daily.   nitroGLYCERIN 0.4 MG SL tablet Commonly known as:  NITROSTAT Place 0.4 mg under the tongue every 5 (five) minutes as needed for chest pain.   OXYGEN Inhale 1 L/min into the lungs as needed.   spironolactone 25 MG tablet Commonly known as:  ALDACTONE Take 12.5 mg by mouth daily.   SYSTANE BALANCE 0.6 % Soln Generic drug:  Propylene Glycol Apply to eye. One drop both eyes 2 times a day due to dry eyes   zinc oxide 20 % ointment Apply 1 application topically. Apply ointment twice daily as needed to buttocks , apply to boney  prominences mid spine three times daily as needed       Review of Systems  Constitutional: Negative for chills and fever.  HENT: Positive for hearing loss. Negative for ear pain.   Eyes: Negative for photophobia and pain.       Corrective lenses  Respiratory: Positive for shortness of breath. Negative for cough and wheezing.        Chronic respiratory failure on O2.  Cardiovascular: Positive for leg swelling.       Pacemaker. History of syncope related to cardiac arrhythmia. BLE edema trace R>L  Gastrointestinal: Negative for abdominal pain.  Endocrine: Negative for polydipsia.  Genitourinary: Positive for frequency and urgency. Negative for dysuria and flank pain.       Underwent Urology evaluation in the past, off meds. Episodes of incontinence persist.  Musculoskeletal: Positive for back pain, gait problem, myalgias and neck pain (Fall on 03/15/15 causing pain in the left knee and effusion.).       Poor balance and gait disturbance. Lower back pain Sever kyphosis  Skin:       Sebaceous cyst of the forehead.  Neurological: Negative for tremors, seizures and weakness.       Memory loss  Psychiatric/Behavioral: Positive for  confusion and decreased concentration.       Moderate memory disturbance.    Immunization History  Administered Date(s) Administered  . Influenza Whole 09/23/2010, 09/23/2012  . Influenza,inj,Quad PF,36+ Mos 10/09/2014  . Influenza-Unspecified 09/10/2013, 09/30/2015, 10/05/2016  . PPD Test 05/23/2015  . Pneumococcal Polysaccharide-23 03/31/2009  . Td 03/31/2009   Pertinent  Health Maintenance Due  Topic Date Due  . OPHTHALMOLOGY EXAM  06/26/1933  . PNA vac Low Risk Adult (2 of 2 - PCV13) 03/31/2010  . URINE MICROALBUMIN  06/03/2011  . HEMOGLOBIN A1C  12/06/2015  . FOOT EXAM  03/16/2016  . INFLUENZA VACCINE  Completed   Fall Risk  09/26/2015 05/26/2015 04/29/2015 04/04/2015 03/17/2015  Falls in the past year? _0   Number falls in past yr: _1 or more  Injury with Fall? No Yes Yes Yes Yes  Risk Factor Category  - High Fall Risk - High Fall Risk High Fall Risk  Risk for fall due to : Impaired balance/gait History of fall(s);Impaired balance/gait;Impaired mobility - History of fall(s);Impaired balance/gait;Impaired mobility History of fall(s);Impaired balance/gait;Impaired mobility  Follow up Falls evaluation completed Falls evaluation completed - Falls evaluation completed Falls evaluation completed    Vitals:   01/28/17 1436  BP: 116/85  Pulse: 71  Resp: (!) 24  Temp: 97.5 F (36.4 C)  SpO2: 97%  Weight: 164 lb (74.4 kg)  Height: _2  (1.753 m)   Body mass index is 24.22 kg/m. Physical Exam  Constitutional: He is oriented to person, place, and time.  Frail  HENT:  Head: Normocephalic and atraumatic.  Right Ear: No drainage, swelling or tenderness. No foreign bodies. No mastoid tenderness. Tympanic membrane is not injected, not scarred, not perforated, not erythematous, not retracted and not bulging. Tympanic membrane mobility is normal. No hemotympanum. Decreased hearing is noted.  Left Ear: No drainage, swelling or tenderness. No foreign bodies. No  mastoid tenderness. Tympanic membrane is not injected, not scarred, not perforated, not erythematous, not retracted and not bulging. Tympanic membrane mobility is normal. No hemotympanum. Decreased hearing is noted.  Nose: Nose normal.  Bilateral hearing aids.  Eyes: Conjunctivae are normal. Pupils are equal, round, and reactive to light.  Prescription lenses.  Neck: Normal range  of motion. Neck supple. No JVD present. No thyromegaly present.  Cardiovascular: Normal rate and regular rhythm.  Exam reveals no gallop and no friction rub.   Murmur heard.  Systolic murmur is present with a grade of 1/6  Absent DP and PT  Pulmonary/Chest: Effort normal. He has no wheezes. He has rales. He exhibits no tenderness.  Using portable oxygen at 2 L/m today. Moist rales posterior mid to lower lungs.   Abdominal: Soft. Bowel sounds are normal. He exhibits no distension. There is no tenderness.  Musculoskeletal: Normal range of motion. He exhibits edema. He exhibits no tenderness.  Pain in the left SI joint area. Limps when walking. Favors the left side. Effusion of the left knee. Scars in the left knee from previous surgery. Tender at the left knee with movement of the patella. Positional lower back pain Server kyphosis RLE edema trace  Neurological: He is alert and oriented to person, place, and time. He has normal strength. A sensory deficit is present. No cranial nerve deficit. Gait abnormal.  07/13/14 MMSE 28/30. Passed clock drawing. Diminshed sensation to vabration and monofilament.  Skin: Skin is warm and dry. No erythema. No pallor.  Rough area on the left cheek. Sebaceous cyst  in the mid forehead. Mid sternal surgical scar. Left upper chest pacemaker.  Psychiatric: He has a normal mood and affect. His behavior is normal. Thought content normal.  Patient is in denial regarding the extent of his memory deficit.    Labs reviewed:  Recent Labs  03/06/16 1448 03/29/16 11/01/16  NA 140 137 139    K 4.4 4.5 4.4  CL 101  --   --   CO2 29  --   --   GLUCOSE 98  --   --   BUN 45* 43* 48*  CREATININE 1.15* 1.1 1.2  CALCIUM 9.6  --   --     Recent Labs  02/09/16 03/29/16 11/01/16  AST _0 ALT _1 ALKPHOS 210* 131* 106    Recent Labs  03/22/16 05/31/16 11/01/16  WBC 5.4 6.6 5.6  HGB 12.9* 14.2 14.4  HCT 40* 42 43  PLT 143* 172 124*   Lab Results  Component Value Date   TSH 4.27 01/21/2017   Lab Results  Component Value Date   HGBA1C 6.7 (A) 06/06/2015   Lab Results  Component Value Date   CHOL 132 02/07/2015   HDL 33 (A) 02/07/2015   LDLCALC 76 02/07/2015   LDLDIRECT 85.6 07/19/2010   TRIG 115 02/07/2015   CHOLHDL 4 03/06/2011    Assessment/Plan 1. Shortness of breath -Increase furosemide to 40 mg qd  2. Chronic combined systolic and diastolic CHF (congestive heart failure) (HCC) -BNP, BMP -furosemide to 40 mg qd -Increase spironolactone to 25 mg qd  3. CKD stage 3 due to type 1 diabetes mellitus (HCC) -BMP next week  4. Type 2 diabetes mellitus with diabetic nephropathy, without long-term current use of insulin (HCC) -A1c  5. Iron deficiency anemia due to chronic blood loss resolved  6. Ischemic cardiomyopathy -increased furosemide and spirnolactone

## 2017-02-04 DIAGNOSIS — R0600 Dyspnea, unspecified: Secondary | ICD-10-CM | POA: Diagnosis not present

## 2017-02-04 DIAGNOSIS — E118 Type 2 diabetes mellitus with unspecified complications: Secondary | ICD-10-CM | POA: Diagnosis not present

## 2017-02-04 DIAGNOSIS — N179 Acute kidney failure, unspecified: Secondary | ICD-10-CM | POA: Diagnosis not present

## 2017-02-05 ENCOUNTER — Non-Acute Institutional Stay (SKILLED_NURSING_FACILITY): Payer: Medicare Other | Admitting: Nurse Practitioner

## 2017-02-05 ENCOUNTER — Encounter: Payer: Self-pay | Admitting: Nurse Practitioner

## 2017-02-05 DIAGNOSIS — M4850XS Collapsed vertebra, not elsewhere classified, site unspecified, sequela of fracture: Secondary | ICD-10-CM | POA: Diagnosis not present

## 2017-02-05 DIAGNOSIS — N183 Chronic kidney disease, stage 3 unspecified: Secondary | ICD-10-CM

## 2017-02-05 DIAGNOSIS — E1022 Type 1 diabetes mellitus with diabetic chronic kidney disease: Secondary | ICD-10-CM | POA: Diagnosis not present

## 2017-02-05 DIAGNOSIS — R413 Other amnesia: Secondary | ICD-10-CM | POA: Diagnosis not present

## 2017-02-05 DIAGNOSIS — F325 Major depressive disorder, single episode, in full remission: Secondary | ICD-10-CM

## 2017-02-05 DIAGNOSIS — K5900 Constipation, unspecified: Secondary | ICD-10-CM | POA: Diagnosis not present

## 2017-02-05 DIAGNOSIS — I480 Paroxysmal atrial fibrillation: Secondary | ICD-10-CM | POA: Diagnosis not present

## 2017-02-05 DIAGNOSIS — E1142 Type 2 diabetes mellitus with diabetic polyneuropathy: Secondary | ICD-10-CM

## 2017-02-05 DIAGNOSIS — E039 Hypothyroidism, unspecified: Secondary | ICD-10-CM | POA: Diagnosis not present

## 2017-02-05 DIAGNOSIS — I10 Essential (primary) hypertension: Secondary | ICD-10-CM

## 2017-02-05 DIAGNOSIS — K219 Gastro-esophageal reflux disease without esophagitis: Secondary | ICD-10-CM

## 2017-02-05 DIAGNOSIS — I5042 Chronic combined systolic (congestive) and diastolic (congestive) heart failure: Secondary | ICD-10-CM

## 2017-02-05 DIAGNOSIS — IMO0001 Reserved for inherently not codable concepts without codable children: Secondary | ICD-10-CM

## 2017-02-05 NOTE — Assessment & Plan Note (Signed)
06/06/15 Hgb A1c 6.7 diet control.   

## 2017-02-05 NOTE — Assessment & Plan Note (Signed)
Mood is stable.

## 2017-02-05 NOTE — Assessment & Plan Note (Signed)
Rate is in control w/o medications. off Eliquis 2nd to Hgb 7s.  

## 2017-02-05 NOTE — Progress Notes (Signed)
Location:  [place] Room Number: N9 Place of Service:  SNF (31) Provider:  Mast, Manxie  NP  Jeanmarie Hubert, MD  Patient Care Team: Estill Dooms, MD as PCP - General (Internal Medicine) Irine Seal, MD as Consulting Physician (Urology) Renella Cunas, MD (Inactive) as Consulting Physician (Cardiology) [facility] Lelon Perla, MD as Consulting Physician (Cardiology) Man Otho Darner, NP as Nurse Practitioner (Nurse Practitioner) Rigoberto Noel, MD as Consulting Physician (Pulmonary Disease)  Extended Emergency Contact Information Primary Emergency Contact: Kathline Magic, Rosezella Rumpf of [place] Phone: [phone] Relation: Daughter Secondary Emergency Contact: Johnnette Gourd [place] of [employer] Phone: [phone] Mobile Phone: [phone] Relation: Son  Code Status:  DNR Goals of care: Advanced Directive information Advanced Directives 01/28/2017  Does Patient Have a Medical Advance Directive? Yes  Type of Paramedic of [place];Living will;Out of facility DNR (pink MOST or yellow form)  Does patient want to make changes to medical advance directive? -  Copy of [place] in Chart? Yes  Pre-existing out of facility DNR order (yellow form or pink MOST form) Yellow form placed in chart (order not valid for inpatient use);Pink MOST form placed in chart (order not valid for inpatient use)     Chief Complaint  Patient presents with  . Medical Management of Chronic Issues    HPI:  Pt is a 83 y.o. male seen today for medical management of chronic diseases.     Hx of Afib, heart rate is in control, dementia, depression, stable, HTN, controlled while on furosemide 47m and Spironolactone 12.575m anemia, Hgb 7.6, dropped from 11.4 12/22/15, last Hgb 14.4 11/11/16, off Iron, ASA/Elliquis dc'd, CHF BNP 1449. compensated, BNP 745.9 02/04/17, on Furosemide 1056mnd  Spironolactone 12.5mg59mily. Hypothyroidism, taking Levothyroxine 25mc41mSH 4.27 01/21/17 Past Medical History:  Diagnosis Date  . ANEMIA-IRON DEFICIENCY 05/27/2007  . ANXIETY 11/14/2007  . BENIGN PROSTATIC HYPERTROPHY 11/14/2007  . CAROTID ARTERY DISEASE 10/02/2010  . Chronic combined systolic and diastolic CHF (congestive heart failure) (HCC) [place]18/2015  . CKD stage 3 due to type 1 diabetes mellitus (HCC) [place]4/2016  . COLONIC POLYPS, HX OF 05/27/2007  . CORONARY ARTERY DISEASE 11/14/2007   prior bypass [phone] MI 11/07; Severe 3 vessel disease; occluded SVG to RCA; SVG to OM subtotalled, SVG to diagonal with ostial 70 and 80 mid; EF 45. PCI of SVG to OM. FU myovue showed EF 56 with inferior infarct and very mild peri-infarct ischemia  . DEPRESSION 11/14/2007  . DIABETES MELLITUS, TYPE II 11/14/2007  . DIVERTICULITIS, HX OF 05/27/2007  . DM (diabetes mellitus), type 2 with renal complications (HCC) [place]7/8/78/6767VT (deep venous thrombosis) (HCC) [place]8/2012  . Dyspnea 11/09/2014  . Effusion of left knee 03/17/2015  . Failure to thrive in adult 05/03/2015  . FOOT PAIN, BILATERAL 12/08/2010  . Generalized weakness 05/20/2015  . GERD 11/14/2007  . Hearing loss 05/13/2009   Qualifier: Diagnosis of  By: John Jenny ReichmannJamesHunt OrisHyperlipemia 11/14/2007   Qualifier: Diagnosis of  By: John Jenny ReichmannJamesHunt Oris HYPERLIPIDEMIA 11/14/2007  . HYPERTENSION 11/14/2007  . HYPOTENSION, ORTHOSTATIC 03/06/2011  . Intermittent high-grade heart block 09/05/2009   s/p pacemaker  . Ischemic cardiomyopathy    a. echo (10/15):  mod focal basal septal hypertrophy, EF 40-45%, inf AK, Gr 1 DD, Al sclerosis, mild AI, MAC, mild  MR, mod LAE, lipomatous hypertrophy, small effusion (no hemodynamic compromise)   . Memory loss 06/01/2008  . Myalgia and myositis, unspecified 04/24/2011  . Osteoarthrosis, unspecified whether generalized or localized, unspecified site   . OSTEOPOROSIS 06/02/2010  . Other malaise and fatigue  04/24/2011  . Pacemaker-St. Jude 02/02/2008   St. Jude  . Peripheral neuropathy ([place]) 03/27/2011  . PERIPHERAL VASCULAR DISEASE 11/14/2007  . Prostate cancer ([place])   . Protein-calorie malnutrition, severe ([place]) 05/23/2015  . RENAL INSUFFICIENCY 11/14/2007  . Seborrheic keratosis 06/05/2011  . SECONDARY DM W/PERIPHERAL CIRC D/O UNCONTROLLED 11/07/2010  . Severe back pain   . SYNCOPE 09/08/2008  . Unspecified hearing loss 05/13/2009  . Vertebral compression fracture ([place]) 09/05/2009   Qualifier: Diagnosis of  By: Jenny Reichmann MD, Hunt Oris   . [name] 04/04/2015  . Weight loss 10/10/2014   Past Surgical History:  Procedure Laterality Date  . APPENDECTOMY    . CHOLECYSTECTOMY    . CORONARY ARTERY BYPASS GRAFT  2007  . CYSTOSCOPY    . PACEMAKER INSERTION  2012   Caryl Comes, MD  . prostate needle biopsy    . s/p bilat knee replacement  1992   Paul, MD  . s/p coronary stent x 1      Allergies  Allergen Reactions  . Hydrocodone     hallucination  . Tramadol     hallucination    Allergies as of 02/05/2017      Reactions   Hydrocodone    hallucination   Tramadol    hallucination      Medication List       Accurate as of 02/05/17  4:25 PM. Always use your most recent med list.          BOOST DIABETIC Liqd Take 237 mLs by mouth 3 (three) times daily between meals.   carbamide peroxide 6.5 % otic solution Commonly known as:  DEBROX 5 drops. Instil 5 - 10 drops in each ear at bedtime 3 times a week every other month   docusate sodium 100 MG capsule Commonly known as:  COLACE Take 100 mg by mouth 2 (two) times daily.   famotidine 20 MG tablet Commonly known as:  PEPCID Take 20 mg by mouth at bedtime. For GERD   feeding supplement (PRO-STAT SUGAR FREE 64) Liqd Take 30 mLs by mouth 3 (three) times daily with meals.   fentaNYL 25 MCG/HR patch Commonly known as:  DURAGESIC Apply fresh patch every 3 days to help pain. Remove old patch.   furosemide 20 MG tablet Commonly known as:   LASIX Take 10 mg by mouth daily.   levothyroxine 25 MCG tablet Commonly known as:  SYNTHROID, LEVOTHROID Take 25 mcg by mouth daily.   nitroGLYCERIN 0.4 MG SL tablet Commonly known as:  NITROSTAT Place 0.4 mg under the tongue every 5 (five) minutes as needed for chest pain.   OXYGEN Inhale 1 L/min into the lungs as needed.   spironolactone 25 MG tablet Commonly known as:  ALDACTONE Take 12.5 mg by mouth daily.   SYSTANE BALANCE 0.6 % Soln Generic drug:  Propylene Glycol Apply to eye. One drop both eyes 2 times a day due to dry eyes   zinc oxide 20 % ointment Apply 1 application topically. Apply ointment twice daily as needed to buttocks , apply to boney prominences mid spine three times daily as needed       Review of Systems  Constitutional: Negative for chills and fever.  HENT: Positive for hearing loss.  Negative for ear pain.   Eyes: Negative for photophobia and pain.       Corrective lenses  Respiratory: Positive for shortness of breath. Negative for cough and wheezing.        O2 dependent  Cardiovascular: Positive for leg swelling.       Pacemaker. History of syncope related to cardiac arrhythmia. BLE edema trace R>L  Gastrointestinal: Negative for abdominal pain.  Endocrine: Negative for polydipsia.  Genitourinary: Positive for frequency and urgency. Negative for dysuria and flank pain.       Underwent Urology evaluation in the past, off meds. Episodes of incontinence persist.  Musculoskeletal: Positive for back pain, gait problem, myalgias and neck pain (Fall on 03/15/15 causing pain in the left knee and effusion.).       Poor balance and gait disturbance. Lower back pain Sever kyphosis  Skin:       Rough area on left cheek. Spontaneous ecchymoses in all extremities.     Neurological: Negative for tremors, seizures and weakness.       Memory loss  Psychiatric/Behavioral:       Mild to moderate memory disturbance.    Immunization History  Administered  Date(s) Administered  . Influenza Whole 09/23/2010, 09/23/2012  . Influenza,inj,Quad PF,36+ Mos 10/09/2014  . Influenza-Unspecified 09/10/2013, 09/30/2015, 10/05/2016  . PPD Test 05/23/2015  . Pneumococcal Polysaccharide-23 03/31/2009  . Td 03/31/2009   Pertinent  Health Maintenance Due  Topic Date Due  . OPHTHALMOLOGY EXAM  06/26/1933  . PNA vac Low Risk Adult (2 of 2 - PCV13) 03/31/2010  . URINE MICROALBUMIN  06/03/2011  . HEMOGLOBIN A1C  12/06/2015  . FOOT EXAM  03/16/2016  . INFLUENZA VACCINE  Completed   Fall Risk  09/26/2015 05/26/2015 04/29/2015 04/04/2015 03/17/2015  Falls in the past year? _0   Number falls in past yr: _1 or more  Injury with Fall? No Yes Yes Yes Yes  Risk Factor Category  - High Fall Risk - High Fall Risk High Fall Risk  Risk for fall due to : Impaired balance/gait History of fall(s);Impaired balance/gait;Impaired mobility - History of fall(s);Impaired balance/gait;Impaired mobility History of fall(s);Impaired balance/gait;Impaired mobility  Follow up Falls evaluation completed Falls evaluation completed - Falls evaluation completed Falls evaluation completed   Functional Status Survey:    Vitals:   02/05/17 1340  BP: 113/70  Pulse: 72  Resp: 18  Temp: (!) 96.2 F (35.7 C)  SpO2: 90%   There is no height or weight on file to calculate BMI. Physical Exam  Constitutional: He is oriented to person, place, and time.  Frail  HENT:  Head: Normocephalic and atraumatic.  Right Ear: No drainage, swelling or tenderness. No foreign bodies. No mastoid tenderness. Tympanic membrane is not injected, not scarred, not perforated, not erythematous, not retracted and not bulging. Tympanic membrane mobility is normal. No hemotympanum. Decreased hearing is noted.  Left Ear: No drainage, swelling or tenderness. No foreign bodies. No mastoid tenderness. Tympanic membrane is not injected, not scarred, not perforated, not erythematous, not retracted and  not bulging. Tympanic membrane mobility is normal. No hemotympanum. Decreased hearing is noted.  Nose: Nose normal.  Bilateral hearing aids.  Eyes: Conjunctivae are normal. Pupils are equal, round, and reactive to light.  Prescription lenses.  Neck: Normal range of motion. Neck supple. No JVD present. No thyromegaly present.  Cardiovascular: Normal rate and regular rhythm.  Exam reveals no gallop and no friction rub.   Murmur heard.  Systolic murmur is present with a grade of 1/6  Absent DP and PT  Pulmonary/Chest: Effort normal. He has no wheezes. He has rales. He exhibits no tenderness.  Using portable oxygen at 2 L/m today. Moist rales posterior mid to lower lungs.   Abdominal: Soft. Bowel sounds are normal. He exhibits no distension. There is no tenderness.  Musculoskeletal: Normal range of motion. He exhibits edema. He exhibits no tenderness.  Pain in the left SI joint area. Limps when walking. Favors the left side. Effusion of the left knee. Scars in the left knee from previous surgery. Tender at the left knee with movement of the patella. Positional lower back pain Server kyphosis RLE edema trace  Neurological: He is alert and oriented to person, place, and time. He has normal strength. A sensory deficit is present. No cranial nerve deficit. Gait abnormal.  07/13/14 MMSE 28/30. Passed clock drawing. Diminshed sensation to vabration and monofilament.  Skin: Skin is warm and dry. No erythema. No pallor.  spontaneous ecchymoses in all extremities.      Psychiatric: He has a normal mood and affect. His behavior is normal. Thought content normal.  Patient is in denial regarding the extent of his memory deficit.    Labs reviewed:  Recent Labs  03/06/16 1448 03/29/16 11/01/16  NA 140 137 139  K 4.4 4.5 4.4  CL 101  --   --   CO2 29  --   --   GLUCOSE 98  --   --   BUN 45* 43* 48*  CREATININE 1.15* 1.1 1.2  CALCIUM 9.6  --   --     Recent Labs  02/09/16 03/29/16 11/01/16   AST _0 ALT _1 ALKPHOS 210* 131* 106    Recent Labs  03/22/16 05/31/16 11/01/16  WBC 5.4 6.6 5.6  HGB 12.9* 14.2 14.4  HCT 40* 42 43  PLT 143* 172 124*   Lab Results  Component Value Date   TSH 4.27 01/21/2017   Lab Results  Component Value Date   HGBA1C 6.7 (A) 06/06/2015   Lab Results  Component Value Date   CHOL 132 02/07/2015   HDL 33 (A) 02/07/2015   LDLCALC 76 02/07/2015   LDLDIRECT 85.6 07/19/2010   TRIG 115 02/07/2015   CHOLHDL 4 03/06/2011    Significant Diagnostic Results in last 30 days:  No results found.  Assessment/Plan Essential hypertension Controlled, continue Furosemide 9m and Spironolactone 12.535mdaily, 02/04/17 Na 139, K 4.3, Bun 50, creat 1.39, Hgb a1c 6.3, BNP 745.9    Chronic combined systolic and diastolic CHF (congestive heart failure) ([place]2/12/18 Na 139, K 4.3, Bun 50, creat 1.39, Hgb a1c 6.3, BNP 745.9 Continue Furosemide 1038mSpironolactone 12.5mg28m2/16/17 BNP 1959.3 05/22/16 Na 137, K 4.0, Bun 57, creat 1.15, BNP 993.7 Compensating clinically.    Paroxysmal atrial fibrillation (HCC) Rate is in control w/o medications. off Eliquis 2nd to Hgb 7s.     GERD Stable, continue Famotidine 20mg53mly,  Hgb dropped from 11.4 12/12/15 to 7.6 01/02/16, resolved, Hgb 14.4 11/01/16, continue to be off ASA/Elliquis/Fe  Constipation Stable, continue 06/10/15 Lactulose prn. Colace bid.   DM type 2 with diabetic peripheral neuropathy (HCC) [place]3/16 Hgb A1c 6.7 diet control  CKD stage 3 due to type 1 diabetes mellitus (HCC) Last creat 1.39 02/04/17  Hypothyroidism 11/01/16 TSH 6.09 11/02/16 Levothyroxine 25mcg28m9/18 TSH 4.27  Vertebral compression fracture Controlled, continue Fentanyl transdermal patch  Depression Mood is stable.  Memory loss Progressing. off Namenda R>B. SNF for care needs. Comfort measures.       Family/ staff Communication: SNF  Labs/tests ordered:  none

## 2017-02-05 NOTE — Assessment & Plan Note (Signed)
Stable, continue Famotidine 20mg daily,  Hgb dropped from 11.4 12/12/15 to 7.6 01/02/16, resolved, Hgb 14.4 11/01/16, continue to be off ASA/Elliquis/Fe  

## 2017-02-05 NOTE — Assessment & Plan Note (Signed)
Controlled, continue Fentanyl transdermal patch  

## 2017-02-05 NOTE — Assessment & Plan Note (Addendum)
Controlled, continue Furosemide 10mg and Spironolactone 12.5mg daily, 02/04/17 Na 139, K 4.3, Bun 50, creat 1.39, Hgb a1c 6.3, BNP 745.9 

## 2017-02-05 NOTE — Assessment & Plan Note (Signed)
Progressing. off Namenda R>B. SNF for care needs. Comfort measures.   

## 2017-02-05 NOTE — Assessment & Plan Note (Signed)
Last creat 1.39 02/04/17

## 2017-02-05 NOTE — Assessment & Plan Note (Signed)
Stable, continue 06/10/15 Lactulose prn. Colace bid.  

## 2017-02-05 NOTE — Assessment & Plan Note (Signed)
02/04/17 Na 139, K 4.3, Bun 50, creat 1.39, Hgb a1c 6.3, BNP 745.9 Continue Furosemide 10mg , Spironolactone 12.5mg .  02/09/16 BNP 1959.3 05/22/16 Na 137, K 4.0, Bun 57, creat 1.15, BNP 993.7 Compensating clinically.

## 2017-02-05 NOTE — Assessment & Plan Note (Signed)
11/01/16 TSH 6.09 11/02/16 Levothyroxine 97mcg 01/21/17 TSH 4.27

## 2017-03-05 ENCOUNTER — Encounter: Payer: Self-pay | Admitting: Nurse Practitioner

## 2017-03-05 ENCOUNTER — Non-Acute Institutional Stay (SKILLED_NURSING_FACILITY): Payer: Medicare Other | Admitting: Nurse Practitioner

## 2017-03-05 DIAGNOSIS — M4850XS Collapsed vertebra, not elsewhere classified, site unspecified, sequela of fracture: Secondary | ICD-10-CM

## 2017-03-05 DIAGNOSIS — K219 Gastro-esophageal reflux disease without esophagitis: Secondary | ICD-10-CM

## 2017-03-05 DIAGNOSIS — IMO0001 Reserved for inherently not codable concepts without codable children: Secondary | ICD-10-CM

## 2017-03-05 DIAGNOSIS — R339 Retention of urine, unspecified: Secondary | ICD-10-CM | POA: Diagnosis not present

## 2017-03-05 DIAGNOSIS — I480 Paroxysmal atrial fibrillation: Secondary | ICD-10-CM

## 2017-03-05 DIAGNOSIS — I120 Hypertensive chronic kidney disease with stage 5 chronic kidney disease or end stage renal disease: Secondary | ICD-10-CM

## 2017-03-05 DIAGNOSIS — I5042 Chronic combined systolic (congestive) and diastolic (congestive) heart failure: Secondary | ICD-10-CM | POA: Diagnosis not present

## 2017-03-05 DIAGNOSIS — E039 Hypothyroidism, unspecified: Secondary | ICD-10-CM | POA: Diagnosis not present

## 2017-03-05 DIAGNOSIS — F325 Major depressive disorder, single episode, in full remission: Secondary | ICD-10-CM | POA: Diagnosis not present

## 2017-03-05 DIAGNOSIS — J9610 Chronic respiratory failure, unspecified whether with hypoxia or hypercapnia: Secondary | ICD-10-CM

## 2017-03-05 DIAGNOSIS — R413 Other amnesia: Secondary | ICD-10-CM

## 2017-03-05 DIAGNOSIS — I10 Essential (primary) hypertension: Secondary | ICD-10-CM

## 2017-03-05 DIAGNOSIS — K5900 Constipation, unspecified: Secondary | ICD-10-CM

## 2017-03-05 DIAGNOSIS — N185 Chronic kidney disease, stage 5: Secondary | ICD-10-CM

## 2017-03-05 DIAGNOSIS — E1142 Type 2 diabetes mellitus with diabetic polyneuropathy: Secondary | ICD-10-CM | POA: Diagnosis not present

## 2017-03-05 DIAGNOSIS — R627 Adult failure to thrive: Secondary | ICD-10-CM

## 2017-03-05 NOTE — Progress Notes (Signed)
Location:  [place] Room Number: 9 Place of Service:  SNF (31) Provider:  Attila Mccarthy, [place]   NP  Jeanmarie Hubert, MD  Patient Care Team: Estill Dooms, MD as PCP - General (Internal Medicine) Irine Seal, MD as Consulting Physician (Urology) Renella Cunas, MD (Inactive) as Consulting Physician (Cardiology) [facility] Lelon Perla, MD as Consulting Physician (Cardiology) Krystelle Prashad Otho Darner, NP as Nurse Practitioner (Nurse Practitioner) Rigoberto Noel, MD as Consulting Physician (Pulmonary Disease)  Extended Emergency Contact Information Primary Emergency Contact: Kathline Magic, Rosezella Rumpf of [place] Phone: [phone] Relation: Daughter Secondary Emergency Contact: Johnnette Gourd [place] of [place] Phone: [phone] Mobile Phone: [phone] Relation: Son  Code Status:  DNR Goals of care: Advanced Directive information Advanced Directives 03/05/2017  Does Patient Have a Medical Advance Directive? Yes  Type of Paramedic of [place];Living will;Out of facility DNR (pink MOST or yellow form)  Does patient want to make changes to medical advance directive? No - Patient declined  Copy of [place] in Chart? Yes  Pre-existing out of facility DNR order (yellow form or pink MOST form) Yellow form placed in chart (order not valid for inpatient use)     Chief Complaint  Patient presents with  . Medical Management of Chronic Issues    HPI:  Pt is a 83 y.o. male seen today for medical management of chronic diseases.     Hx of Afib, heart rate is in control, dementia, depression, stable, taking Risperdal. HTN, controlled while on furosemide 36m and Spironolactone 12.573m anemia, Hgb 7.6, dropped from 11.4 12/22/15, last Hgb 14.4 11/11/16, off Iron, ASA/Elliquis dc'd, CHF BNP 1449. compensated, BNP 745.9 02/04/17, on Furosemide 104mnd Spironolactone 12.5mg51mily.  Hypothyroidism, taking Levothyroxine 25mc72mSH 4.27 01/21/17.  Past Medical History:  Diagnosis Date  . ANEMIA-IRON DEFICIENCY 05/27/2007  . ANXIETY 11/14/2007  . BENIGN PROSTATIC HYPERTROPHY 11/14/2007  . CAROTID ARTERY DISEASE 10/02/2010  . Chronic combined systolic and diastolic CHF (congestive heart failure) (HCC) [place]18/2015  . CKD stage 3 due to type 1 diabetes mellitus (HCC) [place]4/2016  . COLONIC POLYPS, HX OF 05/27/2007  . CORONARY ARTERY DISEASE 11/14/2007   prior bypass [phone] MI 11/07; Severe 3 vessel disease; occluded SVG to RCA; SVG to OM subtotalled, SVG to diagonal with ostial 70 and 80 mid; EF 45. PCI of SVG to OM. FU myovue showed EF 56 with inferior infarct and very mild peri-infarct ischemia  . DEPRESSION 11/14/2007  . DIABETES MELLITUS, TYPE II 11/14/2007  . DIVERTICULITIS, HX OF 05/27/2007  . DM (diabetes mellitus), type 2 with renal complications (HCC) [place]7/7/20/9470VT (deep venous thrombosis) (HCC) [place]8/2012  . Dyspnea 11/09/2014  . Effusion of left knee 03/17/2015  . Failure to thrive in adult 05/03/2015  . FOOT PAIN, BILATERAL 12/08/2010  . Generalized weakness 05/20/2015  . GERD 11/14/2007  . Hearing loss 05/13/2009   Qualifier: Diagnosis of  By: John Jenny ReichmannJamesHunt OrisHyperlipemia 11/14/2007   Qualifier: Diagnosis of  By: John Jenny ReichmannJamesHunt Oris HYPERLIPIDEMIA 11/14/2007  . HYPERTENSION 11/14/2007  . HYPOTENSION, ORTHOSTATIC 03/06/2011  . Intermittent high-grade heart block 09/05/2009   s/p pacemaker  . Ischemic cardiomyopathy    a. echo (10/15):  mod focal basal septal hypertrophy, EF 40-45%, inf AK, Gr 1 DD, Al sclerosis, mild AI, MAC, mild MR, mod LAE, lipomatous  hypertrophy, small effusion (no hemodynamic compromise)   . Memory loss 06/01/2008  . Myalgia and myositis, unspecified 04/24/2011  . Osteoarthrosis, unspecified whether generalized or localized, unspecified site   . OSTEOPOROSIS 06/02/2010  . Other malaise and fatigue 04/24/2011  . Pacemaker-St.  Jude 02/02/2008   St. Jude  . Peripheral neuropathy ([place]) 03/27/2011  . PERIPHERAL VASCULAR DISEASE 11/14/2007  . Prostate cancer ([place])   . Protein-calorie malnutrition, severe ([place]) 05/23/2015  . RENAL INSUFFICIENCY 11/14/2007  . Seborrheic keratosis 06/05/2011  . SECONDARY DM W/PERIPHERAL CIRC D/O UNCONTROLLED 11/07/2010  . Severe back pain   . SYNCOPE 09/08/2008  . Unspecified hearing loss 05/13/2009  . Vertebral compression fracture ([place]) 09/05/2009   Qualifier: Diagnosis of  By: Jenny Reichmann MD, Hunt Oris   . [name] 04/04/2015  . Weight loss 10/10/2014   Past Surgical History:  Procedure Laterality Date  . APPENDECTOMY    . CHOLECYSTECTOMY    . CORONARY ARTERY BYPASS GRAFT  2007  . CYSTOSCOPY    . PACEMAKER INSERTION  2012   Caryl Comes, MD  . prostate needle biopsy    . s/p bilat knee replacement  1992   Paul, MD  . s/p coronary stent x 1      Allergies  Allergen Reactions  . Hydrocodone     hallucination  . Tramadol     hallucination    Allergies as of 03/05/2017      Reactions   Hydrocodone    hallucination   Tramadol    hallucination      Medication List       Accurate as of 03/05/17  3:58 PM. Always use your most recent med list.          BOOST DIABETIC Liqd Take 237 mLs by mouth 3 (three) times daily between meals.   carbamide peroxide 6.5 % otic solution Commonly known as:  DEBROX 5 drops. Instil 5 - 10 drops in each ear at bedtime 3 times a week every other month   docusate sodium 100 MG capsule Commonly known as:  COLACE Take 100 mg by mouth 2 (two) times daily.   famotidine 20 MG tablet Commonly known as:  PEPCID Take 20 mg by mouth at bedtime. For GERD   feeding supplement (PRO-STAT SUGAR FREE 64) Liqd Take 30 mLs by mouth 3 (three) times daily with meals.   fentaNYL 25 MCG/HR patch Commonly known as:  DURAGESIC Apply fresh patch every 3 days to help pain. Remove old patch.   furosemide 20 MG tablet Commonly known as:  LASIX Take 10 mg by mouth  daily.   levothyroxine 25 MCG tablet Commonly known as:  SYNTHROID, LEVOTHROID Take 25 mcg by mouth daily.   nitroGLYCERIN 0.4 MG SL tablet Commonly known as:  NITROSTAT Place 0.4 mg under the tongue every 5 (five) minutes as needed for chest pain.   OXYGEN Inhale 1 L/min into the lungs as needed.   spironolactone 25 MG tablet Commonly known as:  ALDACTONE Take 12.5 mg by mouth daily.   SYSTANE BALANCE 0.6 % Soln Generic drug:  Propylene Glycol Apply to eye. One drop both eyes 2 times a day due to dry eyes   zinc oxide 20 % ointment Apply 1 application topically. Apply ointment twice daily as needed to buttocks , apply to boney prominences mid spine three times daily as needed       Review of Systems  Constitutional: Negative for chills and fever.  HENT: Positive for hearing loss. Negative for ear pain.  Eyes: Negative for photophobia and pain.       Corrective lenses  Respiratory: Positive for shortness of breath. Negative for cough and wheezing.        O2 dependent  Cardiovascular: Positive for leg swelling.       Pacemaker. History of syncope related to cardiac arrhythmia. BLE edema trace R>L  Gastrointestinal: Negative for abdominal pain.  Endocrine: Negative for polydipsia.  Genitourinary: Positive for frequency. Negative for dysuria, flank pain and urgency.       Underwent Urology evaluation in the past, off meds. Episodes of incontinence persist.  Musculoskeletal: Positive for back pain, gait problem, myalgias and neck pain (Fall on 03/15/15 causing pain in the left knee and effusion.).       Poor balance and gait disturbance. Lower back pain Sever kyphosis  Skin:       Rough area on left cheek. Spontaneous ecchymoses in all extremities.     Neurological: Negative for tremors, seizures and weakness.       Memory loss  Psychiatric/Behavioral:       Mild to moderate memory disturbance.    Immunization History  Administered Date(s) Administered  . Influenza  Whole 09/23/2010, 09/23/2012  . Influenza,inj,Quad PF,36+ Mos 10/09/2014  . Influenza-Unspecified 09/10/2013, 09/30/2015, 10/05/2016  . PPD Test 05/23/2015  . Pneumococcal Polysaccharide-23 03/31/2009  . Td 03/31/2009   Pertinent  Health Maintenance Due  Topic Date Due  . OPHTHALMOLOGY EXAM  06/26/1933  . PNA vac Low Risk Adult (2 of 2 - PCV13) 03/31/2010  . URINE MICROALBUMIN  06/03/2011  . HEMOGLOBIN A1C  12/06/2015  . FOOT EXAM  03/16/2016  . INFLUENZA VACCINE  Completed   Fall Risk  09/26/2015 05/26/2015 04/29/2015 04/04/2015 03/17/2015  Falls in the past year? _0   Number falls in past yr: _1 or more  Injury with Fall? No Yes Yes Yes Yes  Risk Factor Category  - High Fall Risk - High Fall Risk High Fall Risk  Risk for fall due to : Impaired balance/gait History of fall(s);Impaired balance/gait;Impaired mobility - History of fall(s);Impaired balance/gait;Impaired mobility History of fall(s);Impaired balance/gait;Impaired mobility  Follow up Falls evaluation completed Falls evaluation completed - Falls evaluation completed Falls evaluation completed   Functional Status Survey:    Vitals:   03/05/17 1304  BP: 107/64  Pulse: 75  Resp: 20  Temp: 97.5 F (36.4 C)  SpO2: 96%  Weight: 165 lb (74.8 kg)  Height: _2  (1.753 m)   Body mass index is 24.37 kg/m. Physical Exam  Constitutional: He is oriented to person, place, and time.  Frail  HENT:  Head: Normocephalic and atraumatic.  Right Ear: No drainage, swelling or tenderness. No foreign bodies. No mastoid tenderness. Tympanic membrane is not injected, not scarred, not perforated, not erythematous, not retracted and not bulging. Tympanic membrane mobility is normal. No hemotympanum. Decreased hearing is noted.  Left Ear: No drainage, swelling or tenderness. No foreign bodies. No mastoid tenderness. Tympanic membrane is not injected, not scarred, not perforated, not erythematous, not retracted and not  bulging. Tympanic membrane mobility is normal. No hemotympanum. Decreased hearing is noted.  Nose: Nose normal.  Bilateral hearing aids.  Eyes: Conjunctivae are normal. Pupils are equal, round, and reactive to light.  Prescription lenses.  Neck: Normal range of motion. Neck supple. No JVD present. No thyromegaly present.  Cardiovascular: Normal rate and regular rhythm.  Exam reveals no gallop and no friction rub.   Murmur heard.  Systolic  murmur is present with a grade of 1/6  Absent DP and PT  Pulmonary/Chest: Effort normal. He has no wheezes. He has rales. He exhibits no tenderness.  Using portable oxygen at 2 L/m today. Moist rales posterior mid to lower lungs.   Abdominal: Soft. Bowel sounds are normal. He exhibits no distension. There is no tenderness.  Musculoskeletal: Normal range of motion. He exhibits edema. He exhibits no tenderness.  Pain in the left SI joint area. Limps when walking. Favors the left side. Effusion of the left knee. Scars in the left knee from previous surgery. Tender at the left knee with movement of the patella. Positional lower back pain Server kyphosis RLE edema trace  Neurological: He is alert and oriented to person, place, and time. He has normal strength. A sensory deficit is present. No cranial nerve deficit. Gait abnormal.  07/13/14 MMSE 28/30. Passed clock drawing. Diminshed sensation to vabration and monofilament.  Skin: Skin is warm and dry. No erythema. No pallor.  spontaneous ecchymoses in all extremities.      Psychiatric: He has a normal mood and affect. His behavior is normal. Thought content normal.  Patient is in denial regarding the extent of his memory deficit.    Labs reviewed:  Recent Labs  03/06/16 1448 03/29/16 11/01/16  NA 140 137 139  K 4.4 4.5 4.4  CL 101  --   --   CO2 29  --   --   GLUCOSE 98  --   --   BUN 45* 43* 48*  CREATININE 1.15* 1.1 1.2  CALCIUM 9.6  --   --     Recent Labs  03/29/16 11/01/16  AST 19 22   ALT 12 14  ALKPHOS 131* 106    Recent Labs  03/22/16 05/31/16 11/01/16  WBC 5.4 6.6 5.6  HGB 12.9* 14.2 14.4  HCT 40* 42 43  PLT 143* 172 124*   Lab Results  Component Value Date   TSH 4.27 01/21/2017   Lab Results  Component Value Date   HGBA1C 6.7 (A) 06/06/2015   Lab Results  Component Value Date   CHOL 132 02/07/2015   HDL 33 (A) 02/07/2015   LDLCALC 76 02/07/2015   LDLDIRECT 85.6 07/19/2010   TRIG 115 02/07/2015   CHOLHDL 4 03/06/2011    Significant Diagnostic Results in last 30 days:  No results found.  Assessment/Plan Essential hypertension Controlled, continue Furosemide 28m and Spironolactone 12.51mdaily, 02/04/17 Na 139, K 4.3, Bun 50, creat 1.39, Hgb a1c 6.3, BNP 745.9  Chronic combined systolic and diastolic CHF (congestive heart failure) (HCC) Continue Furosemide 1062mSpironolactone 12.5mg56m2/16/17 BNP 1959.3 02/04/17 Na 139, K 4.3, Bun 50, creat 1.39, Hgb a1c 6.3, BNP 745.9 Compensating clinically.   Paroxysmal atrial fibrillation (HCC) Rate is in control w/o medications. off Eliquis 2nd to Hgb 7s.    Chronic respiratory failure O2 dependent.   GERD Stable, continue Famotidine 20mg63mly,  Hgb dropped from 11.4 12/12/15 to 7.6 01/02/16, resolved, Hgb 14.4 11/01/16, continue to be off ASA/Elliquis/Fe   Constipation Stable, continue 06/10/15 Lactulose prn. Colace bid.    DM type 2 with diabetic peripheral neuropathy (HCC) [place]2/18 Na 139, K 4.3, Bun 50, creat 1.39, Hgb a1c 6.3, BNP 745.9    Hypertensive kidney disease with CKD (chronic kidney disease) stage V (HCC) [place]2/18 Na 139, K 4.3, Bun 50, creat 1.39, Hgb a1c 6.3, BNP 745.9    Hypothyroidism 01/21/17 TSH 4.27, continue Levothyroxine 25mcg81mly.   Vertebral compression fracture  Controlled, continue Fentanyl transdermal patch   Urine retention Urinary leakage  Depression Continue Risperdal, stable.   Memory loss 02/15/17 Dr Nyoka Cowden Risperidone 0.48m qhs   Failure to  thrive in adult Continue supportive care     Family/ staff Communication: SNF  Labs/tests ordered:  none

## 2017-03-05 NOTE — Assessment & Plan Note (Signed)
Controlled, continue Furosemide 10mg  and Spironolactone 12.5mg  daily, 02/04/17 Na 139, K 4.3, Bun 50, creat 1.39, Hgb a1c 6.3, BNP 745.9

## 2017-03-05 NOTE — Assessment & Plan Note (Signed)
O2 dependent.

## 2017-03-05 NOTE — Assessment & Plan Note (Signed)
Rate is in control w/o medications. off Eliquis 2nd to Hgb 7s.

## 2017-03-05 NOTE — Assessment & Plan Note (Signed)
02/15/17 Dr Nyoka Cowden Risperidone 0.25mg  qhs

## 2017-03-05 NOTE — Assessment & Plan Note (Signed)
Continue Risperdal, stable.

## 2017-03-05 NOTE — Assessment & Plan Note (Signed)
02/04/17 Na 139, K 4.3, Bun 50, creat 1.39, Hgb a1c 6.3, BNP 745.9

## 2017-03-05 NOTE — Assessment & Plan Note (Signed)
Stable, continue 06/10/15 Lactulose prn. Colace bid.

## 2017-03-05 NOTE — Assessment & Plan Note (Signed)
Continue supportive care

## 2017-03-05 NOTE — Assessment & Plan Note (Signed)
Controlled, continue Fentanyl transdermal patch

## 2017-03-05 NOTE — Assessment & Plan Note (Signed)
01/21/17 TSH 4.27, continue Levothyroxine 84mcg daily.

## 2017-03-05 NOTE — Assessment & Plan Note (Signed)
Stable, continue Famotidine 20mg  daily,  Hgb dropped from 11.4 12/12/15 to 7.6 01/02/16, resolved, Hgb 14.4 11/01/16, continue to be off ASA/Elliquis/Fe

## 2017-03-05 NOTE — Assessment & Plan Note (Signed)
Urinary leakage.  

## 2017-03-05 NOTE — Assessment & Plan Note (Signed)
Continue Furosemide 10mg , Spironolactone 12.5mg .  02/09/16 BNP 1959.3 02/04/17 Na 139, K 4.3, Bun 50, creat 1.39, Hgb a1c 6.3, BNP 745.9 Compensating clinically.

## 2017-03-29 ENCOUNTER — Non-Acute Institutional Stay (SKILLED_NURSING_FACILITY): Payer: Medicare Other | Admitting: Nurse Practitioner

## 2017-03-29 ENCOUNTER — Encounter: Payer: Self-pay | Admitting: Nurse Practitioner

## 2017-03-29 DIAGNOSIS — K219 Gastro-esophageal reflux disease without esophagitis: Secondary | ICD-10-CM | POA: Diagnosis not present

## 2017-03-29 DIAGNOSIS — I5042 Chronic combined systolic (congestive) and diastolic (congestive) heart failure: Secondary | ICD-10-CM

## 2017-03-29 DIAGNOSIS — M6281 Muscle weakness (generalized): Secondary | ICD-10-CM | POA: Diagnosis not present

## 2017-03-29 DIAGNOSIS — E1142 Type 2 diabetes mellitus with diabetic polyneuropathy: Secondary | ICD-10-CM

## 2017-03-29 DIAGNOSIS — I480 Paroxysmal atrial fibrillation: Secondary | ICD-10-CM | POA: Diagnosis not present

## 2017-03-29 DIAGNOSIS — R413 Other amnesia: Secondary | ICD-10-CM | POA: Diagnosis not present

## 2017-03-29 DIAGNOSIS — E039 Hypothyroidism, unspecified: Secondary | ICD-10-CM

## 2017-03-29 DIAGNOSIS — J9610 Chronic respiratory failure, unspecified whether with hypoxia or hypercapnia: Secondary | ICD-10-CM | POA: Diagnosis not present

## 2017-03-29 DIAGNOSIS — F325 Major depressive disorder, single episode, in full remission: Secondary | ICD-10-CM | POA: Diagnosis not present

## 2017-03-29 DIAGNOSIS — M4850XS Collapsed vertebra, not elsewhere classified, site unspecified, sequela of fracture: Secondary | ICD-10-CM | POA: Diagnosis not present

## 2017-03-29 DIAGNOSIS — Z9181 History of falling: Secondary | ICD-10-CM | POA: Diagnosis not present

## 2017-03-29 DIAGNOSIS — IMO0001 Reserved for inherently not codable concepts without codable children: Secondary | ICD-10-CM

## 2017-03-29 DIAGNOSIS — I10 Essential (primary) hypertension: Secondary | ICD-10-CM | POA: Diagnosis not present

## 2017-03-29 DIAGNOSIS — N185 Chronic kidney disease, stage 5: Secondary | ICD-10-CM

## 2017-03-29 DIAGNOSIS — K5900 Constipation, unspecified: Secondary | ICD-10-CM | POA: Diagnosis not present

## 2017-03-29 DIAGNOSIS — I120 Hypertensive chronic kidney disease with stage 5 chronic kidney disease or end stage renal disease: Secondary | ICD-10-CM

## 2017-03-29 LAB — CBC AND DIFFERENTIAL
HCT: 41 % (ref 41–53)
HEMOGLOBIN: 13.8 g/dL (ref 13.5–17.5)
Platelets: 132 10*3/uL — AB (ref 150–399)
WBC: 6.4 10^3/mL

## 2017-03-29 LAB — HEPATIC FUNCTION PANEL
ALT: 8 U/L — AB (ref 10–40)
AST: 16 U/L (ref 14–40)
Alkaline Phosphatase: 112 U/L (ref 25–125)
Bilirubin, Total: 0.9 mg/dL

## 2017-03-29 LAB — HEMOGLOBIN A1C: Hemoglobin A1C: 6.7

## 2017-03-29 LAB — BASIC METABOLIC PANEL
BUN: 48 mg/dL — AB (ref 4–21)
CREATININE: 1.1 mg/dL (ref <=1.3)
GLUCOSE: 164 mg/dL
POTASSIUM: 4 mmol/L (ref 3.4–5.3)
SODIUM: 137 mmol/L (ref 137–147)

## 2017-03-29 LAB — TSH: TSH: 4.71 u[IU]/mL (ref <=5.90)

## 2017-03-29 NOTE — Assessment & Plan Note (Signed)
02/15/17 Dr Nyoka Cowden Risperidone 0.25mg  qhs 03/29/17 reduce Risperdal to 0.25mg  qod, improved symptoms, elevated CBGs

## 2017-03-29 NOTE — Assessment & Plan Note (Signed)
01/21/17 TSH 4.27, continue Levothyroxine 27mcg daily. Update TSH

## 2017-03-29 NOTE — Assessment & Plan Note (Signed)
02/04/17 Na 139, K 4.3, Bun 50, creat 1.39, Hgb a1c 6.3, BNP 745.9 Adding Metformin 500mg  bid po, reduce Risperdal 0.25mg  qod, update BMP 2 weeks.

## 2017-03-29 NOTE — Assessment & Plan Note (Signed)
Controlled, continue Furosemide 10mg  and Spironolactone 12.5mg  daily

## 2017-03-29 NOTE — Assessment & Plan Note (Signed)
Stabilized, will decrease Risperdal to 0.25mg  qod, observe for returning of targeted symptoms.

## 2017-03-29 NOTE — Assessment & Plan Note (Signed)
O2 dependent.

## 2017-03-29 NOTE — Assessment & Plan Note (Signed)
02/04/17 Na 139, K 4.3, Bun 50, creat 1.39, Hgb a1c 6.3, BNP 745.9 Elevated fasting

## 2017-03-29 NOTE — Assessment & Plan Note (Signed)
Stable, continue 06/10/15 Lactulose prn. Colace bid.

## 2017-03-29 NOTE — Assessment & Plan Note (Signed)
Stable, continue Famotidine 20mg  daily,  Hgb dropped from 11.4 12/12/15 to 7.6 01/02/16, resolved, Hgb 14.4 11/01/16, continue to be off ASA/Elliquis/Fe

## 2017-03-29 NOTE — Assessment & Plan Note (Signed)
02/04/17 Na 139, K 4.3, Bun 50, creat 1.39, Hgb a1c 6.3, BNP 745.9 CMP now, BMP 2 weeks after Metformin 500mg  bid

## 2017-03-29 NOTE — Assessment & Plan Note (Signed)
Rate is in control w/o medications. off Eliquis 2nd to Hgb 7s.

## 2017-03-29 NOTE — Assessment & Plan Note (Signed)
Continue Furosemide 10mg , Spironolactone 12.5mg .  02/09/16 BNP 1959.3 02/04/17 Na 139, K 4.3, Bun 50, creat 1.39, Hgb a1c 6.3, BNP 745.9 Compensating clinically.

## 2017-03-29 NOTE — Assessment & Plan Note (Signed)
Controlled pain, continue Fentanyl transdermal patch

## 2017-03-29 NOTE — Progress Notes (Signed)
Location:  [facility] Room Number: 9 Place of Service:  SNF (31) Provider:  Nelvin Tomb, Manxie  NP  Jeanmarie Hubert, MD  Patient Care Team: Estill Dooms, MD as PCP - General (Internal Medicine) Irine Seal, MD as Consulting Physician (Urology) Renella Cunas, MD (Inactive) as Consulting Physician (Cardiology) [facility] Lelon Perla, MD as Consulting Physician (Cardiology) Jemeka Wagler Otho Darner, NP as Nurse Practitioner (Nurse Practitioner) Rigoberto Noel, MD as Consulting Physician (Pulmonary Disease)  Extended Emergency Contact Information Primary Emergency Contact: Kathline Magic, Rosezella Rumpf of [place] Phone: [phone] Relation: Daughter Secondary Emergency Contact: Johnnette Gourd [place] of [place] Phone: [phone] Mobile Phone: [phone] Relation: Son  Code Status: DNR Goals of care: Advanced Directive information Advanced Directives 03/29/2017  Does Patient Have a Medical Advance Directive? Yes  Type of Paramedic of [place];Living will;Out of facility DNR (pink MOST or yellow form)  Does patient want to make changes to medical advance directive? No - Patient declined  Copy of [place] in Chart? Yes  Pre-existing out of facility DNR order (yellow form or pink MOST form) Yellow form placed in chart (order not valid for inpatient use)     Chief Complaint  Patient presents with  . Acute Visit    Elevated blood sugar    HPI:  Pt is a 83 y.o. male seen today for an acute visit for elevated CBGs, 03/28/17 Novolin 5u x1, fasting CBGs 141-178, not on blood sugar meds. Recent started Risperdal may contributory, improved agitation and restlessness, taking Risperdal 0.171m qd.     Hx of Afib, heart rate is in control, dementia, depression, stable, taking Risperdal. HTN, controlled while on furosemide 119mand Spironolactone 12.71m76manemia, Hgb 7.6, dropped from 11.4  12/22/15, last Hgb 14.4 11/11/16, off Iron, ASA/Elliquis dc'd, CHF BNP 1449. compensated, BNP 745.9 02/04/17,on Furosemide 59m1md Spironolactone 12.71mg 64mly. Hypothyroidism, taking Levothyroxine 271mcg19mH 4.27 01/21/17.  Past Medical History:  Diagnosis Date  . ANEMIA-IRON DEFICIENCY 05/27/2007  . ANXIETY 11/14/2007  . BENIGN PROSTATIC HYPERTROPHY 11/14/2007  . CAROTID ARTERY DISEASE 10/02/2010  . Chronic combined systolic and diastolic CHF (congestive heart failure) (HCC) 1[place]8/2015  . CKD stage 3 due to type 1 diabetes mellitus (HCC) 3[place]/2016  . COLONIC POLYPS, HX OF 05/27/2007  . CORONARY ARTERY DISEASE 11/14/2007   prior bypass [phone] MI 11/07; Severe 3 vessel disease; occluded SVG to RCA; SVG to OM subtotalled, SVG to diagonal with ostial 70 and 80 mid; EF 45. PCI of SVG to OM. FU myovue showed EF 56 with inferior infarct and very mild peri-infarct ischemia  . DEPRESSION 11/14/2007  . DIABETES MELLITUS, TYPE II 11/14/2007  . DIVERTICULITIS, HX OF 05/27/2007  . DM (diabetes mellitus), type 2 with renal complications (HCC) 5[place]/23/55/7322T (deep venous thrombosis) (HCC) 3[place]/2012  . Dyspnea 11/09/2014  . Effusion of left knee 03/17/2015  . Failure to thrive in adult 05/03/2015  . FOOT PAIN, BILATERAL 12/08/2010  . Generalized weakness 05/20/2015  . GERD 11/14/2007  . Hearing loss 05/13/2009   Qualifier: Diagnosis of  By: John MJenny Reichmannames Hunt Orisyperlipemia 11/14/2007   Qualifier: Diagnosis of  By: John MJenny Reichmannames Hunt OrisHYPERLIPIDEMIA 11/14/2007  . HYPERTENSION 11/14/2007  . HYPOTENSION, ORTHOSTATIC 03/06/2011  . Intermittent high-grade heart block 09/05/2009   s/p pacemaker  . Ischemic cardiomyopathy  a. echo (10/15):  mod focal basal septal hypertrophy, EF 40-45%, inf AK, Gr 1 DD, Al sclerosis, mild AI, MAC, mild MR, mod LAE, lipomatous hypertrophy, small effusion (no hemodynamic compromise)   . Memory loss 06/01/2008  . Myalgia and myositis, unspecified 04/24/2011  .  Osteoarthrosis, unspecified whether generalized or localized, unspecified site   . OSTEOPOROSIS 06/02/2010  . Other malaise and fatigue 04/24/2011  . Pacemaker-St. Jude 02/02/2008   St. Jude  . Peripheral neuropathy ([place]) 03/27/2011  . PERIPHERAL VASCULAR DISEASE 11/14/2007  . Prostate cancer ([place])   . Protein-calorie malnutrition, severe ([place]) 05/23/2015  . RENAL INSUFFICIENCY 11/14/2007  . Seborrheic keratosis 06/05/2011  . SECONDARY DM W/PERIPHERAL CIRC D/O UNCONTROLLED 11/07/2010  . Severe back pain   . SYNCOPE 09/08/2008  . Unspecified hearing loss 05/13/2009  . Vertebral compression fracture ([place]) 09/05/2009   Qualifier: Diagnosis of  By: Jenny Reichmann MD, Hunt Oris   . [name] 04/04/2015  . Weight loss 10/10/2014   Past Surgical History:  Procedure Laterality Date  . APPENDECTOMY    . CHOLECYSTECTOMY    . CORONARY ARTERY BYPASS GRAFT  2007  . CYSTOSCOPY    . PACEMAKER INSERTION  2012   Caryl Comes, MD  . prostate needle biopsy    . s/p bilat knee replacement  1992   Paul, MD  . s/p coronary stent x 1      Allergies  Allergen Reactions  . Hydrocodone     hallucination  . Tramadol     hallucination    Allergies as of 03/29/2017      Reactions   Hydrocodone    hallucination   Tramadol    hallucination      Medication List       Accurate as of 03/29/17  2:35 PM. Always use your most recent med list.          BOOST DIABETIC Liqd Take 237 mLs by mouth 3 (three) times daily between meals.   carbamide peroxide 6.5 % otic solution Commonly known as:  DEBROX 5 drops. Instil 5 - 10 drops in each ear at bedtime 3 times a week every other month   docusate sodium 100 MG capsule Commonly known as:  COLACE Take 100 mg by mouth 2 (two) times daily.   famotidine 20 MG tablet Commonly known as:  PEPCID Take 20 mg by mouth at bedtime. For GERD   feeding supplement (PRO-STAT SUGAR FREE 64) Liqd Take 30 mLs by mouth 3 (three) times daily with meals.   fentaNYL 25 MCG/HR patch Commonly known  as:  DURAGESIC Apply fresh patch every 3 days to help pain. Remove old patch.   furosemide 20 MG tablet Commonly known as:  LASIX Take 10 mg by mouth daily.   levothyroxine 25 MCG tablet Commonly known as:  SYNTHROID, LEVOTHROID Take 25 mcg by mouth daily.   nitroGLYCERIN 0.4 MG SL tablet Commonly known as:  NITROSTAT Place 0.4 mg under the tongue every 5 (five) minutes as needed for chest pain.   OXYGEN Inhale 1 L/min into the lungs as needed.   risperiDONE 0.25 MG tablet Commonly known as:  RISPERDAL Take 0.25 mg by mouth at bedtime.   spironolactone 25 MG tablet Commonly known as:  ALDACTONE Take 12.5 mg by mouth daily.   SYSTANE BALANCE 0.6 % Soln Generic drug:  Propylene Glycol Apply to eye. One drop both eyes 2 times a day due to dry eyes   zinc oxide 20 % ointment Apply 1 application topically. Apply ointment  twice daily as needed to buttocks , apply to boney prominences mid spine three times daily as needed       Review of Systems  Constitutional: Negative for chills and fever.  HENT: Positive for hearing loss. Negative for ear pain.   Eyes: Negative for photophobia and pain.       Corrective lenses  Respiratory: Positive for shortness of breath. Negative for cough and wheezing.        O2 dependent  Cardiovascular: Positive for leg swelling.       Pacemaker. History of syncope related to cardiac arrhythmia. BLE edema trace R>L  Gastrointestinal: Negative for abdominal pain.  Endocrine: Negative for polydipsia.  Genitourinary: Positive for frequency. Negative for dysuria, flank pain and urgency.       Underwent Urology evaluation in the past, off meds. Episodes of incontinence persist.  Musculoskeletal: Positive for back pain, gait problem, myalgias and neck pain (Fall on 03/15/15 causing pain in the left knee and effusion.).       Poor balance and gait disturbance. Lower back pain Sever kyphosis  Skin:       Rough area on left cheek. Spontaneous  ecchymoses in all extremities.     Neurological: Negative for tremors, seizures and weakness.       Memory loss  Psychiatric/Behavioral:       Mild to moderate memory disturbance.    Immunization History  Administered Date(s) Administered  . Influenza Whole 09/23/2010, 09/23/2012  . Influenza,inj,Quad PF,36+ Mos 10/09/2014  . Influenza-Unspecified 09/10/2013, 09/30/2015, 10/05/2016  . PPD Test 05/23/2015  . Pneumococcal Polysaccharide-23 03/31/2009  . Td 03/31/2009   Pertinent  Health Maintenance Due  Topic Date Due  . OPHTHALMOLOGY EXAM  06/26/1933  . PNA vac Low Risk Adult (2 of 2 - PCV13) 03/31/2010  . URINE MICROALBUMIN  06/03/2011  . HEMOGLOBIN A1C  12/06/2015  . FOOT EXAM  03/16/2016  . INFLUENZA VACCINE  07/24/2017   Fall Risk  09/26/2015 05/26/2015 04/29/2015 04/04/2015 03/17/2015  Falls in the past year? Yes Yes Yes Yes Yes  Number falls in past yr: 1 1 1 1 2  or more  Injury with Fall? No Yes Yes Yes Yes  Risk Factor Category  - High Fall Risk - High Fall Risk High Fall Risk  Risk for fall due to : Impaired balance/gait History of fall(s);Impaired balance/gait;Impaired mobility - History of fall(s);Impaired balance/gait;Impaired mobility History of fall(s);Impaired balance/gait;Impaired mobility  Follow up Falls evaluation completed Falls evaluation completed - Falls evaluation completed Falls evaluation completed   Functional Status Survey:    Vitals:   03/29/17 1149  BP: (!) 166/85  Pulse: 71  Resp: (!) 22  Temp: 98.7 F (37.1 C)  SpO2: 96%  Weight: 164 lb (74.4 kg)  Height: 5' 9"  (1.753 m)   Body mass index is 24.22 kg/m. Physical Exam  Constitutional: He is oriented to person, place, and time.  Frail  HENT:  Head: Normocephalic and atraumatic.  Right Ear: No drainage, swelling or tenderness. No foreign bodies. No mastoid tenderness. Tympanic membrane is not injected, not scarred, not perforated, not erythematous, not retracted and not bulging. Tympanic  membrane mobility is normal. No hemotympanum. Decreased hearing is noted.  Left Ear: No drainage, swelling or tenderness. No foreign bodies. No mastoid tenderness. Tympanic membrane is not injected, not scarred, not perforated, not erythematous, not retracted and not bulging. Tympanic membrane mobility is normal. No hemotympanum. Decreased hearing is noted.  Nose: Nose normal.  Bilateral hearing aids.  Eyes: Conjunctivae are  normal. Pupils are equal, round, and reactive to light.  Prescription lenses.  Neck: Normal range of motion. Neck supple. No JVD present. No thyromegaly present.  Cardiovascular: Normal rate and regular rhythm.  Exam reveals no gallop and no friction rub.   Murmur heard.  Systolic murmur is present with a grade of 1/6  Absent DP and PT  Pulmonary/Chest: Effort normal. He has no wheezes. He has rales. He exhibits no tenderness.  Using portable oxygen at 2 L/m today. Moist rales posterior mid to lower lungs.   Abdominal: Soft. Bowel sounds are normal. He exhibits no distension. There is no tenderness.  Musculoskeletal: Normal range of motion. He exhibits edema. He exhibits no tenderness.  Pain in the left SI joint area. Limps when walking. Favors the left side. Effusion of the left knee. Scars in the left knee from previous surgery. Tender at the left knee with movement of the patella. Positional lower back pain Server kyphosis RLE edema trace  Neurological: He is alert and oriented to person, place, and time. He has normal strength. A sensory deficit is present. No cranial nerve deficit. Gait abnormal.  07/13/14 MMSE 28/30. Passed clock drawing. Diminshed sensation to vabration and monofilament.  Skin: Skin is warm and dry. No erythema. No pallor.  spontaneous ecchymoses in all extremities.      Psychiatric: He has a normal mood and affect. His behavior is normal. Thought content normal.  Patient is in denial regarding the extent of his memory deficit.    Labs  reviewed:  Recent Labs  11/01/16  NA 139  K 4.4  BUN 48*  CREATININE 1.2    Recent Labs  11/01/16  AST 22  ALT 14  ALKPHOS 106    Recent Labs  05/31/16 11/01/16  WBC 6.6 5.6  HGB 14.2 14.4  HCT 42 43  PLT 172 124*   Lab Results  Component Value Date   TSH 4.27 01/21/2017   Lab Results  Component Value Date   HGBA1C 6.7 (A) 06/06/2015   Lab Results  Component Value Date   CHOL 132 02/07/2015   HDL 33 (A) 02/07/2015   LDLCALC 76 02/07/2015   LDLDIRECT 85.6 07/19/2010   TRIG 115 02/07/2015   CHOLHDL 4 03/06/2011    Significant Diagnostic Results in last 30 days:  No results found.  Assessment/Plan DM type 2 with diabetic peripheral neuropathy ([place]) 02/04/17 Na 139, K 4.3, Bun 50, creat 1.39, Hgb a1c 6.3, BNP 745.9 Adding Metformin 570m bid po, reduce Risperdal 0.253mqod, update BMP 2 weeks.   Essential hypertension Controlled, continue Furosemide 1048mnd Spironolactone 12.5mg47mily  Chronic combined systolic and diastolic CHF (congestive heart failure) (HCC) Continue Furosemide 10mg26mironolactone 12.5mg. 61m16/17 BNP 1959.3 02/04/17 Na 139, K 4.3, Bun 50, creat 1.39, Hgb a1c 6.3, BNP 745.9 Compensating clinically.    Paroxysmal atrial fibrillation (HCC) Rate is in control w/o medications. off Eliquis 2nd to Hgb 7s.   Chronic respiratory failure O2 dependent.   GERD Stable, continue Famotidine 20mg d70m,  Hgb dropped from 11.4 12/12/15 to 7.6 01/02/16, resolved, Hgb 14.4 11/01/16, continue to be off ASA/Elliquis/Fe    Constipation Stable, continue 06/10/15 Lactulose prn. Colace bid.   DM (diabetes mellitus), type 2 with renal complications (HCC) 2/[place]186/37/85, K 4.3, Bun 50, creat 1.39, Hgb a1c 6.3, BNP 745.9 Elevated fasting   Hypothyroidism 01/21/17 TSH 4.27, continue Levothyroxine 25mcg d76m. Update TSH  Vertebral compression fracture Controlled pain, continue Fentanyl transdermal patch   Hypertensive kidney  disease with CKD  (chronic kidney disease) stage V ([place]) 02/04/17 Na 139, K 4.3, Bun 50, creat 1.39, Hgb a1c 6.3, BNP 745.9 CMP now, BMP 2 weeks after Metformin 574m bid  Depression Stabilized, will decrease Risperdal to 0.266mqod, observe for returning of targeted symptoms.   Memory loss 02/15/17 Dr GrNyoka Cowdenisperidone 0.2545mhs 03/29/17 reduce Risperdal to 0.66m22md, improved symptoms, elevated CBGs     Family/ staff Communication: SNF  Labs/tests ordered:  CBC CMP TSH Hgb a1c

## 2017-04-02 ENCOUNTER — Other Ambulatory Visit: Payer: Self-pay | Admitting: *Deleted

## 2017-04-02 ENCOUNTER — Encounter: Payer: Self-pay | Admitting: Nurse Practitioner

## 2017-04-11 DIAGNOSIS — N179 Acute kidney failure, unspecified: Secondary | ICD-10-CM | POA: Diagnosis not present

## 2017-04-11 LAB — BASIC METABOLIC PANEL
BUN: 57 mg/dL — AB (ref 4–21)
Creatinine: 1.3 mg/dL (ref <=1.3)
Glucose: 122 mg/dL
Potassium: 4.4 mmol/L (ref 3.4–5.3)
Sodium: 134 mmol/L — AB (ref 137–147)

## 2017-04-12 ENCOUNTER — Other Ambulatory Visit: Payer: Self-pay | Admitting: *Deleted

## 2017-04-25 ENCOUNTER — Non-Acute Institutional Stay (SKILLED_NURSING_FACILITY): Payer: Medicare Other | Admitting: Internal Medicine

## 2017-04-25 ENCOUNTER — Encounter: Payer: Self-pay | Admitting: Internal Medicine

## 2017-04-25 DIAGNOSIS — F325 Major depressive disorder, single episode, in full remission: Secondary | ICD-10-CM

## 2017-04-25 DIAGNOSIS — E039 Hypothyroidism, unspecified: Secondary | ICD-10-CM | POA: Diagnosis not present

## 2017-04-25 DIAGNOSIS — R627 Adult failure to thrive: Secondary | ICD-10-CM

## 2017-04-25 DIAGNOSIS — R413 Other amnesia: Secondary | ICD-10-CM

## 2017-04-25 DIAGNOSIS — I10 Essential (primary) hypertension: Secondary | ICD-10-CM | POA: Diagnosis not present

## 2017-04-25 DIAGNOSIS — E1121 Type 2 diabetes mellitus with diabetic nephropathy: Secondary | ICD-10-CM | POA: Diagnosis not present

## 2017-04-25 DIAGNOSIS — I5042 Chronic combined systolic (congestive) and diastolic (congestive) heart failure: Secondary | ICD-10-CM

## 2017-04-25 NOTE — Progress Notes (Signed)
Progress Note    Location:  [place] Room Number: N9 Place of Service:  SNF [phone]) Provider:  Jeanmarie Hubert, MD  Patient Care Team: Estill Dooms, MD as PCP - General (Internal Medicine) Irine Seal, MD as Consulting Physician (Urology) Renella Cunas, MD (Inactive) as Consulting Physician (Cardiology) [facility] Lelon Perla, MD as Consulting Physician (Cardiology) Man Otho Darner, NP as Nurse Practitioner (Nurse Practitioner) Rigoberto Noel, MD as Consulting Physician (Pulmonary Disease)  Extended Emergency Contact Information Primary Emergency Contact: Kathline Magic, Rosezella Rumpf of [place] Phone: [phone] Relation: Daughter Secondary Emergency Contact: Johnnette Gourd [place] of [place] Phone: [phone] Mobile Phone: [phone] Relation: Son  Code Status:  DNR Goals of care: Advanced Directive information Advanced Directives 04/25/2017  Does Patient Have a Medical Advance Directive? Yes  Type of Paramedic of [place];Out of facility DNR (pink MOST or yellow form)  Does patient want to make changes to medical advance directive? -  Copy of [place] in Chart? Yes  Pre-existing out of facility DNR order (yellow form or pink MOST form) Yellow form placed in chart (order not valid for inpatient use);Pink MOST form placed in chart (order not valid for inpatient use)     Chief Complaint  Patient presents with  . Medical Management of Chronic Issues    routine visit  . Immunizations    needs PCV13  . diabetic    needs eye and foot exam  . Labs Only    due for urine microalbium    HPI:  Pt is a 83 y.o. male seen today for medical management of chronic diseases.    Memory loss - unchanged  Type 2 diabetes mellitus with diabetic nephropathy, without long-term current use of insulin (HCC) - controlled  Essential hypertension - controlled  Chronic  combined systolic and diastolic CHF (congestive heart failure) ([place]) - compensated  Major depressive disorder with single episode, in remission ([place]) - stable on current meds  Failure to thrive in adult - has stabilized  Hypothyroidism, unspecified type - low dosse levothyroxine at 25 mcg qd. Last TSH was normal     Past Medical History:  Diagnosis Date  . ANEMIA-IRON DEFICIENCY 05/27/2007  . ANXIETY 11/14/2007  . BENIGN PROSTATIC HYPERTROPHY 11/14/2007  . CAROTID ARTERY DISEASE 10/02/2010  . Chronic combined systolic and diastolic CHF (congestive heart failure) ([place]) 10/10/2014  . CKD stage 3 due to type 1 diabetes mellitus ([place]) 03/17/2015  . COLONIC POLYPS, HX OF 05/27/2007  . CORONARY ARTERY DISEASE 11/14/2007   prior bypass [phone]; Inferior MI 11/07; Severe 3 vessel disease; occluded SVG to RCA; SVG to OM subtotalled, SVG to diagonal with ostial 70 and 80 mid; EF 45. PCI of SVG to OM. FU myovue showed EF 56 with inferior infarct and very mild peri-infarct ischemia  . DEPRESSION 11/14/2007  . DIABETES MELLITUS, TYPE II 11/14/2007  . DIVERTICULITIS, HX OF 05/27/2007  . DM (diabetes mellitus), type 2 with renal complications ([place]) 7/51/7001  . DVT (deep venous thrombosis) ([place]) 03/21/2011  . Dyspnea 11/09/2014  . Effusion of left knee 03/17/2015  . Failure to thrive in adult 05/03/2015  . FOOT PAIN, BILATERAL 12/08/2010  . Generalized weakness 05/20/2015  . GERD 11/14/2007  . Hearing loss 05/13/2009   Qualifier: Diagnosis of  By: Jenny Reichmann MD, Hunt Oris   . Hyperlipemia 11/14/2007   Qualifier: Diagnosis of  ByJenny Reichmann MD, Hunt Oris    . HYPERLIPIDEMIA 11/14/2007  . HYPERTENSION 11/14/2007  . HYPOTENSION, ORTHOSTATIC 03/06/2011  . Intermittent high-grade heart block 09/05/2009   s/p pacemaker  . Ischemic cardiomyopathy    a. echo (10/15):  mod focal basal septal hypertrophy, EF 40-45%, inf AK, Gr 1 DD, Al sclerosis, mild AI, MAC, mild MR, mod LAE, lipomatous hypertrophy, small effusion (no  hemodynamic compromise)   . Memory loss 06/01/2008  . Myalgia and myositis, unspecified 04/24/2011  . Osteoarthrosis, unspecified whether generalized or localized, unspecified site   . OSTEOPOROSIS 06/02/2010  . Other malaise and fatigue 04/24/2011  . Pacemaker-St. Jude 02/02/2008   St. Jude  . Peripheral neuropathy 03/27/2011  . PERIPHERAL VASCULAR DISEASE 11/14/2007  . Prostate cancer ([place])   . Protein-calorie malnutrition, severe ([place]) 05/23/2015  . RENAL INSUFFICIENCY 11/14/2007  . Seborrheic keratosis 06/05/2011  . SECONDARY DM W/PERIPHERAL CIRC D/O UNCONTROLLED 11/07/2010  . Severe back pain   . SYNCOPE 09/08/2008  . Unspecified hearing loss 05/13/2009  . Vertebral compression fracture ([place]) 09/05/2009   Qualifier: Diagnosis of  By: Jenny Reichmann MD, Hunt Oris   . Zachary Harris 04/04/2015  . Weight loss 10/10/2014   Past Surgical History:  Procedure Laterality Date  . APPENDECTOMY    . CHOLECYSTECTOMY    . CORONARY ARTERY BYPASS GRAFT  2007  . CYSTOSCOPY    . PACEMAKER INSERTION  2012   Caryl Comes, MD  . prostate needle biopsy    . s/p bilat knee replacement  1992   Paul, MD  . s/p coronary stent x 1      Allergies  Allergen Reactions  . Hydrocodone     hallucination  . Tramadol     hallucination    Outpatient Encounter Prescriptions as of 04/25/2017  Medication Sig  . Amino Acids-Protein Hydrolys (FEEDING SUPPLEMENT, PRO-STAT SUGAR FREE 64,) LIQD Take 30 mLs by mouth 3 (three) times daily with meals.   . carbamide peroxide (DEBROX) 6.5 % otic solution 5 drops. Instil 5 - 10 drops in each ear at bedtime 3 times a week every other month  . docusate sodium (COLACE) 100 MG capsule Take 100 mg by mouth 2 (two) times daily.  . famotidine (PEPCID) 20 MG tablet Take 20 mg by mouth at bedtime. For GERD  . fentaNYL (DURAGESIC) 25 MCG/HR patch Apply fresh patch every 3 days to help pain. Remove old patch.  . furosemide (LASIX) 20 MG tablet Take 10 mg by mouth daily.   Marland Kitchen levothyroxine (SYNTHROID, LEVOTHROID) 25  MCG tablet Take 25 mcg by mouth daily.  . metFORMIN (GLUCOPHAGE) 500 MG tablet Take 500 mg by mouth. Take one twice a day  . nitroGLYCERIN (NITROSTAT) 0.4 MG SL tablet Place 0.4 mg under the tongue every 5 (five) minutes as needed for chest pain.  . Nutritional Supplements (BOOST DIABETIC) LIQD Take 237 mLs by mouth 3 (three) times daily between meals.  . OXYGEN Inhale 1 L/min into the lungs as needed.   Marland Kitchen Propylene Glycol (SYSTANE BALANCE) 0.6 % SOLN Apply to eye. One drop both eyes 2 times a day due to dry eyes  . risperiDONE (RISPERDAL) 0.25 MG tablet Take 0.25 mg by mouth at bedtime.  Marland Kitchen spironolactone (ALDACTONE) 25 MG tablet Take 12.5 mg by mouth daily.   Marland Kitchen zinc oxide 20 % ointment Apply 1 application topically. Apply ointment twice daily as needed to buttocks , apply to boney prominences mid spine three times daily as needed   No facility-administered encounter medications on  file as of 04/25/2017.     Review of Systems  Constitutional: Negative for chills and fever.  HENT: Positive for hearing loss. Negative for ear pain.   Eyes: Negative for photophobia and pain.       Corrective lenses  Respiratory: Positive for shortness of breath. Negative for cough and wheezing.        O2 dependent  Cardiovascular: Positive for leg swelling.       Pacemaker. History of syncope related to cardiac arrhythmia. BLE edema trace R>L  Gastrointestinal: Negative for abdominal pain.  Endocrine: Negative for polydipsia.       Hypothroid  Genitourinary: Positive for frequency. Negative for dysuria, flank pain and urgency.       Underwent Urology evaluation in the past, off meds. Episodes of incontinence persist.  Musculoskeletal: Positive for back pain, gait problem, myalgias and neck pain (Fall on 03/15/15 causing pain in the left knee and effusion.).       Poor balance and gait disturbance. Lower back pain Sever kyphosis  Skin:       Rough area on left cheek. Spontaneous ecchymoses in all  extremities.     Neurological: Negative for tremors, seizures and weakness.       Memory loss  Psychiatric/Behavioral:       Mild to moderate memory disturbance.    Immunization History  Administered Date(s) Administered  . Influenza Whole 09/23/2010, 09/23/2012  . Influenza,inj,Quad PF,36+ Mos 10/09/2014  . Influenza-Unspecified 09/10/2013, 09/30/2015, 10/05/2016  . PPD Test 05/23/2015  . Pneumococcal Polysaccharide-23 03/31/2009  . Td 03/31/2009   Pertinent  Health Maintenance Due  Topic Date Due  . OPHTHALMOLOGY EXAM  06/26/1933  . PNA vac Low Risk Adult (2 of 2 - PCV13) 03/31/2010  . URINE MICROALBUMIN  06/03/2011  . FOOT EXAM  03/16/2016  . INFLUENZA VACCINE  07/24/2017  . HEMOGLOBIN A1C  09/28/2017   Fall Risk  09/26/2015 05/26/2015 04/29/2015 04/04/2015 03/17/2015  Falls in the past year? _0   Number falls in past yr: _1 or more  Injury with Fall? No Yes Yes Yes Yes  Risk Factor Category  - High Fall Risk - High Fall Risk High Fall Risk  Risk for fall due to : Impaired balance/gait History of fall(s);Impaired balance/gait;Impaired mobility - History of fall(s);Impaired balance/gait;Impaired mobility History of fall(s);Impaired balance/gait;Impaired mobility  Follow up Falls evaluation completed Falls evaluation completed - Falls evaluation completed Falls evaluation completed   Functional Status Survey:    Vitals:   04/25/17 1231  BP: 121/70  Pulse: 64  Resp: 18  Temp: 98 F (36.7 C)  SpO2: 93%  Weight: 164 lb (74.4 kg)  Height: _2  (1.753 m)   Body mass index is 24.22 kg/m.  Wt Readings from Last 3 Encounters:  04/25/17 164 lb (74.4 kg)  03/29/17 164 lb (74.4 kg)  03/05/17 165 lb (74.8 kg)    Physical Exam  Constitutional: He is oriented to person, place, and time.  Frail  HENT:  Head: Normocephalic and atraumatic.  Right Ear: No drainage, swelling or tenderness. No foreign bodies. No mastoid tenderness. Tympanic membrane is not  injected, not scarred, not perforated, not erythematous, not retracted and not bulging. Tympanic membrane mobility is normal. No hemotympanum. Decreased hearing is noted.  Left Ear: No drainage, swelling or tenderness. No foreign bodies. No mastoid tenderness. Tympanic membrane is not injected, not scarred, not perforated, not erythematous, not retracted and not bulging. Tympanic membrane mobility is normal. No  hemotympanum. Decreased hearing is noted.  Nose: Nose normal.  Bilateral hearing aids.  Eyes: Conjunctivae are normal. Pupils are equal, round, and reactive to light.  Prescription lenses.  Neck: Normal range of motion. Neck supple. No JVD present. No thyromegaly present.  Cardiovascular: Normal rate and regular rhythm.  Exam reveals no gallop and no friction rub.   Murmur heard.  Systolic murmur is present with a grade of 1/6  Absent DP and PT  Pulmonary/Chest: Effort normal. He has no wheezes. He has rales. He exhibits no tenderness.  Using portable oxygen at 2 L/m today. Moist rales posterior mid to lower lungs.   Abdominal: Soft. Bowel sounds are normal. He exhibits no distension. There is no tenderness.  Musculoskeletal: Normal range of motion. He exhibits edema. He exhibits no tenderness.  Pain in the left SI joint area. Limps when walking. Favors the left side. Effusion of the left knee. Scars in the left knee from previous surgery. Tender at the left knee with movement of the patella. Positional lower back pain Server kyphosis RLE edema trace  Neurological: He is alert and oriented to person, place, and time. He has normal strength. A sensory deficit is present. No cranial nerve deficit. Gait abnormal.  07/13/14 MMSE 28/30. Passed clock drawing. Diminshed sensation to vabration and monofilament.  Skin: Skin is warm and dry. No erythema. No pallor.  spontaneous ecchymoses in all extremities.      Psychiatric: He has a normal mood and affect. His behavior is normal. Thought  content normal.  Patient is in denial regarding the extent of his memory deficit.    Labs reviewed:  Recent Labs  11/01/16 03/29/17 04/11/17  NA 139 137 134*  K 4.4 4.0 4.4  BUN 48* 48* 57*  CREATININE 1.2 1.1 1.3    Recent Labs  11/01/16 03/29/17  AST 22 16  ALT 14 8*  ALKPHOS 106 112    Recent Labs  05/31/16 11/01/16 03/29/17  WBC 6.6 5.6 6.4  HGB 14.2 14.4 13.8  HCT 42 43 41  PLT 172 124* 132*   Lab Results  Component Value Date   TSH 4.71 03/29/2017   Lab Results  Component Value Date   HGBA1C 6.7 03/29/2017   Lab Results  Component Value Date   CHOL 132 02/07/2015   HDL 33 (A) 02/07/2015   LDLCALC 76 02/07/2015   LDLDIRECT 85.6 07/19/2010   TRIG 115 02/07/2015   CHOLHDL 4 03/06/2011    Assessment/Plan 1. Memory loss continue to monitor  2. Type 2 diabetes mellitus with diabetic nephropathy, without long-term current use of insulin (HCC) controlled  3. Essential hypertension controlled  4. Chronic combined systolic and diastolic CHF (congestive heart failure) (HCC) compensated  5. Major depressive disorder with single episode, in remission ([place]) Stable on current meds  6. Failure to thrive in adult resolved  7. Hypothyroidism, unspecified type The current medical regimen is effective;  continue present plan and medications.

## 2017-04-30 DIAGNOSIS — R627 Adult failure to thrive: Secondary | ICD-10-CM | POA: Diagnosis not present

## 2017-04-30 LAB — MICROALBUMIN, URINE: Microalb, Ur: 1.2

## 2017-05-03 ENCOUNTER — Other Ambulatory Visit: Payer: Self-pay | Admitting: *Deleted

## 2017-05-03 DIAGNOSIS — L84 Corns and callosities: Secondary | ICD-10-CM | POA: Diagnosis not present

## 2017-05-03 DIAGNOSIS — E1159 Type 2 diabetes mellitus with other circulatory complications: Secondary | ICD-10-CM | POA: Diagnosis not present

## 2017-05-03 DIAGNOSIS — L602 Onychogryphosis: Secondary | ICD-10-CM | POA: Diagnosis not present

## 2017-05-14 ENCOUNTER — Encounter: Payer: Self-pay | Admitting: Nurse Practitioner

## 2017-05-14 ENCOUNTER — Non-Acute Institutional Stay (SKILLED_NURSING_FACILITY): Payer: Medicare Other | Admitting: Nurse Practitioner

## 2017-05-14 DIAGNOSIS — E1121 Type 2 diabetes mellitus with diabetic nephropathy: Secondary | ICD-10-CM

## 2017-05-14 DIAGNOSIS — K5900 Constipation, unspecified: Secondary | ICD-10-CM | POA: Diagnosis not present

## 2017-05-14 DIAGNOSIS — E1142 Type 2 diabetes mellitus with diabetic polyneuropathy: Secondary | ICD-10-CM | POA: Diagnosis not present

## 2017-05-14 DIAGNOSIS — IMO0001 Reserved for inherently not codable concepts without codable children: Secondary | ICD-10-CM

## 2017-05-14 DIAGNOSIS — I10 Essential (primary) hypertension: Secondary | ICD-10-CM

## 2017-05-14 DIAGNOSIS — E039 Hypothyroidism, unspecified: Secondary | ICD-10-CM

## 2017-05-14 DIAGNOSIS — K219 Gastro-esophageal reflux disease without esophagitis: Secondary | ICD-10-CM

## 2017-05-14 DIAGNOSIS — I480 Paroxysmal atrial fibrillation: Secondary | ICD-10-CM

## 2017-05-14 DIAGNOSIS — N138 Other obstructive and reflux uropathy: Secondary | ICD-10-CM

## 2017-05-14 DIAGNOSIS — I739 Peripheral vascular disease, unspecified: Secondary | ICD-10-CM

## 2017-05-14 DIAGNOSIS — I5042 Chronic combined systolic (congestive) and diastolic (congestive) heart failure: Secondary | ICD-10-CM

## 2017-05-14 DIAGNOSIS — F325 Major depressive disorder, single episode, in full remission: Secondary | ICD-10-CM | POA: Diagnosis not present

## 2017-05-14 DIAGNOSIS — N403 Nodular prostate with lower urinary tract symptoms: Secondary | ICD-10-CM | POA: Diagnosis not present

## 2017-05-14 DIAGNOSIS — R413 Other amnesia: Secondary | ICD-10-CM

## 2017-05-14 DIAGNOSIS — M4850XS Collapsed vertebra, not elsewhere classified, site unspecified, sequela of fracture: Secondary | ICD-10-CM

## 2017-05-14 NOTE — Assessment & Plan Note (Signed)
Stabilized, continue Risperdal to 0.25mg  qod, observe for returning of targeted symptoms.

## 2017-05-14 NOTE — Assessment & Plan Note (Signed)
Stable, continue 06/10/15 Lactulose prn. Colace bid.

## 2017-05-14 NOTE — Assessment & Plan Note (Signed)
SNF for care assistance.

## 2017-05-14 NOTE — Assessment & Plan Note (Signed)
03/29/17 Hgb a1c 6.7 Continue Metformin 500mg  bid.

## 2017-05-14 NOTE — Progress Notes (Signed)
Location:  [place] Room Number: 9 Place of Service:  SNF (31) Provider:  Mast, Manxie NP  Estill Dooms, MD  Patient Care Team: Estill Dooms, MD as PCP - General (Internal Medicine) Irine Seal, MD as Consulting Physician (Urology) Verl Blalock, Marijo Conception, MD (Inactive) as Consulting Physician (Cardiology) [place], [facility] Crenshaw, Denice Bors, MD as Consulting Physician (Cardiology) Mast, Man X, NP as Nurse Practitioner (Nurse Practitioner) Rigoberto Noel, MD as Consulting Physician (Pulmonary Disease)  Extended Emergency Contact Information Primary Emergency Contact: Kathline Magic, Rosezella Rumpf of [place] Phone: [phone] Relation: Daughter Secondary Emergency Contact: Johnnette Gourd [place] of [place] Phone: [phone] Mobile Phone: [phone] Relation: Son  Code Status:  DNR Goals of care: Advanced Directive information Advanced Directives 05/14/2017  Does Patient Have a Medical Advance Directive? Yes  Type of Paramedic of [place];Living will;Out of facility DNR (pink MOST or yellow form)  Does patient want to make changes to medical advance directive? No - Patient declined  Copy of [place] in Chart? Yes  Pre-existing out of facility DNR order (yellow form or pink MOST form) Yellow form placed in chart (order not valid for inpatient use)     Chief Complaint  Patient presents with  . Acute Visit    UTI    HPI:  Pt is a 83 y.o. male seen today for an acute visit for    Past Medical History:  Diagnosis Date  . ANEMIA-IRON DEFICIENCY 05/27/2007  . ANXIETY 11/14/2007  . BENIGN PROSTATIC HYPERTROPHY 11/14/2007  . CAROTID ARTERY DISEASE 10/02/2010  . Chronic combined systolic and diastolic CHF (congestive heart failure) ([employer]) 10/10/2014  . CKD stage 3 due to type 1 diabetes mellitus ([place]) 03/17/2015  . COLONIC POLYPS, HX OF 05/27/2007  . CORONARY ARTERY  DISEASE 11/14/2007   prior bypass [phone]; Inferior MI 11/07; Severe 3 vessel disease; occluded SVG to RCA; SVG to OM subtotalled, SVG to diagonal with ostial 70 and 80 mid; EF 45. PCI of SVG to OM. FU myovue showed EF 56 with inferior infarct and very mild peri-infarct ischemia  . DEPRESSION 11/14/2007  . DIABETES MELLITUS, TYPE II 11/14/2007  . DIVERTICULITIS, HX OF 05/27/2007  . DM (diabetes mellitus), type 2 with renal complications ([place]) 12/24/7508  . DVT (deep venous thrombosis) ([place]) 03/21/2011  . Dyspnea 11/09/2014  . Effusion of left knee 03/17/2015  . Failure to thrive in adult 05/03/2015  . FOOT PAIN, BILATERAL 12/08/2010  . Generalized weakness 05/20/2015  . GERD 11/14/2007  . Hearing loss 05/13/2009   Qualifier: Diagnosis of  By: Jenny Reichmann MD, Hunt Oris   . Hyperlipemia 11/14/2007   Qualifier: Diagnosis of  By: Jenny Reichmann MD, Hunt Oris    . HYPERLIPIDEMIA 11/14/2007  . HYPERTENSION 11/14/2007  . HYPOTENSION, ORTHOSTATIC 03/06/2011  . Intermittent high-grade heart block 09/05/2009   s/p pacemaker  . Ischemic cardiomyopathy    a. echo (10/15):  mod focal basal septal hypertrophy, EF 40-45%, inf AK, Gr 1 DD, Al sclerosis, mild AI, MAC, mild MR, mod LAE, lipomatous hypertrophy, small effusion (no hemodynamic compromise)   . Memory loss 06/01/2008  . Myalgia and myositis, unspecified 04/24/2011  . Osteoarthrosis, unspecified whether generalized or localized, unspecified site   . OSTEOPOROSIS 06/02/2010  . Other malaise and fatigue 04/24/2011  . Pacemaker-St. Jude 02/02/2008   St. Jude  . Peripheral neuropathy 03/27/2011  . PERIPHERAL VASCULAR DISEASE 11/14/2007  .  Prostate cancer ([place])   . Protein-calorie malnutrition, severe ([place]) 05/23/2015  . RENAL INSUFFICIENCY 11/14/2007  . Seborrheic keratosis 06/05/2011  . SECONDARY DM W/PERIPHERAL CIRC D/O UNCONTROLLED 11/07/2010  . Severe back pain   . SYNCOPE 09/08/2008  . Unspecified hearing loss 05/13/2009  . Vertebral compression fracture ([place]) 09/05/2009    Qualifier: Diagnosis of  By: Jenny Reichmann MD, Hunt Oris   . [name] 04/04/2015  . Weight loss 10/10/2014   Past Surgical History:  Procedure Laterality Date  . APPENDECTOMY    . CHOLECYSTECTOMY    . CORONARY ARTERY BYPASS GRAFT  2007  . CYSTOSCOPY    . PACEMAKER INSERTION  2012   Caryl Comes, MD  . prostate needle biopsy    . s/p bilat knee replacement  1992   Paul, MD  . s/p coronary stent x 1      Allergies  Allergen Reactions  . Hydrocodone     hallucination  . Tramadol     hallucination    Outpatient Encounter Prescriptions as of 05/14/2017  Medication Sig  . Amino Acids-Protein Hydrolys (FEEDING SUPPLEMENT, PRO-STAT SUGAR FREE 64,) LIQD Take 30 mLs by mouth 3 (three) times daily with meals.   . carbamide peroxide (DEBROX) 6.5 % otic solution 5 drops. Instil 5 - 10 drops in each ear at bedtime 3 times a week every other month  . docusate sodium (COLACE) 100 MG capsule Take 100 mg by mouth 2 (two) times daily.  . famotidine (PEPCID) 20 MG tablet Take 20 mg by mouth at bedtime. For GERD  . fentaNYL (DURAGESIC) 25 MCG/HR patch Apply fresh patch every 3 days to help pain. Remove old patch.  . furosemide (LASIX) 20 MG tablet Take 10 mg by mouth daily.   Marland Kitchen levothyroxine (SYNTHROID, LEVOTHROID) 25 MCG tablet Take 25 mcg by mouth daily.  . metFORMIN (GLUCOPHAGE) 500 MG tablet Take 500 mg by mouth. Take one twice a day  . nitroGLYCERIN (NITROSTAT) 0.4 MG SL tablet Place 0.4 mg under the tongue every 5 (five) minutes as needed for chest pain.  . Nutritional Supplements (BOOST DIABETIC) LIQD Take 237 mLs by mouth 3 (three) times daily between meals.  . OXYGEN Inhale 1 L/min into the lungs as needed.   Marland Kitchen Propylene Glycol (SYSTANE BALANCE) 0.6 % SOLN Apply to eye. One drop both eyes 2 times a day due to dry eyes  . risperiDONE (RISPERDAL) 0.25 MG tablet Take 0.25 mg by mouth at bedtime.  Marland Kitchen spironolactone (ALDACTONE) 25 MG tablet Take 12.5 mg by mouth daily.   Marland Kitchen zinc oxide 20 % ointment Apply 1  application topically. Apply ointment twice daily as needed to buttocks , apply to boney prominences mid spine three times daily as needed   No facility-administered encounter medications on file as of 05/14/2017.     Review of Systems  Immunization History  Administered Date(s) Administered  . Influenza Whole 09/23/2010, 09/23/2012  . Influenza,inj,Quad PF,36+ Mos 10/09/2014  . Influenza-Unspecified 09/10/2013, 09/30/2015, 10/05/2016  . PPD Test 05/23/2015  . Pneumococcal Polysaccharide-23 03/31/2009  . Td 03/31/2009   Pertinent  Health Maintenance Due  Topic Date Due  . OPHTHALMOLOGY EXAM  06/26/1933  . PNA vac Low Risk Adult (2 of 2 - PCV13) 03/31/2010  . FOOT EXAM  03/16/2016  . INFLUENZA VACCINE  07/24/2017  . HEMOGLOBIN A1C  09/28/2017  . URINE MICROALBUMIN  04/30/2018   Fall Risk  09/26/2015 05/26/2015 04/29/2015 04/04/2015 03/17/2015  Falls in the past year? _0   Number  falls in past yr: _0 or more  Injury with Fall? No Yes Yes Yes Yes  Risk Factor Category  - High Fall Risk - High Fall Risk High Fall Risk  Risk for fall due to : Impaired balance/gait History of fall(s);Impaired balance/gait;Impaired mobility - History of fall(s);Impaired balance/gait;Impaired mobility History of fall(s);Impaired balance/gait;Impaired mobility  Follow up Falls evaluation completed Falls evaluation completed - Falls evaluation completed Falls evaluation completed   Functional Status Survey:    Vitals:   05/14/17 1233  BP: 138/75  Pulse: 70  Resp: 17  Temp: 98.1 F (36.7 C)  SpO2: 94%  Weight: 164 lb (74.4 kg)  Height: _1  (1.753 m)   Body mass index is 24.22 kg/m. Physical Exam  Labs reviewed:  Recent Labs  11/01/16 03/29/17 04/11/17  NA 139 137 134*  K 4.4 4.0 4.4  BUN 48* 48* 57*  CREATININE 1.2 1.1 1.3    Recent Labs  11/01/16 03/29/17  AST 22 16  ALT 14 8*  ALKPHOS 106 112    Recent Labs  05/31/16 11/01/16 03/29/17  WBC 6.6 5.6 6.4    HGB 14.2 14.4 13.8  HCT 42 43 41  PLT 172 124* 132*   Lab Results  Component Value Date   TSH 4.71 03/29/2017   Lab Results  Component Value Date   HGBA1C 6.7 03/29/2017   Lab Results  Component Value Date   CHOL 132 02/07/2015   HDL 33 (A) 02/07/2015   LDLCALC 76 02/07/2015   LDLDIRECT 85.6 07/19/2010   TRIG 115 02/07/2015   CHOLHDL 4 03/06/2011    Significant Diagnostic Results in last 30 days:  No results found.  Assessment/Plan 1. DM type 2 with diabetic peripheral neuropathy ([place])   2. Hypothyroidism, unspecified type   3. Essential hypertension   4. Peripheral vascular disease ([place])   5. Chronic combined systolic and diastolic CHF (congestive heart failure) (HCC)   6. Paroxysmal atrial fibrillation ([place])   7. Gastroesophageal reflux disease without esophagitis   8. Constipation, unspecified constipation type   9. Type 2 diabetes mellitus with diabetic nephropathy, without long-term current use of insulin ([place])   10. Compression fracture of vertebra, sequela   11. Nodular prostate with urinary obstruction   12. Major depressive disorder with single episode, in remission ([place])   13. Memory loss     Family/ staff Communication:   Labs/tests ordered:

## 2017-05-14 NOTE — Assessment & Plan Note (Addendum)
04/11/17 creat 1.28, 05/01/17 microalbumin/creatinine 20(ADA normal <30)

## 2017-05-14 NOTE — Progress Notes (Signed)
Location:  [facility] Room Number: 9 Place of Service:  SNF (31) Provider:  Claudia Alvizo, Manxie  NP  Estill Dooms, MD  Patient Care Team: Estill Dooms, MD as PCP - General (Internal Medicine) Irine Seal, MD as Consulting Physician (Urology) Verl Blalock, Marijo Conception, MD (Inactive) as Consulting Physician (Cardiology) [place], [facility] Crenshaw, Denice Bors, MD as Consulting Physician (Cardiology) Grazia Taffe X, NP as Nurse Practitioner (Nurse Practitioner) Rigoberto Noel, MD as Consulting Physician (Pulmonary Disease)  Extended Emergency Contact Information Primary Emergency Contact: Kathline Magic, Rosezella Rumpf of [place] Phone: [phone] Relation: Daughter Secondary Emergency Contact: Johnnette Gourd [place] of [place] Phone: [phone] Mobile Phone: [phone] Relation: Son  Code Status: DNR Goals of care: Advanced Directive information Advanced Directives 05/14/2017  Does Patient Have a Medical Advance Directive? Yes  Type of Paramedic of [place];Living will;Out of facility DNR (pink MOST or yellow form)  Does patient want to make changes to medical advance directive? No - Patient declined  Copy of [place] in Chart? Yes  Pre-existing out of facility DNR order (yellow form or pink MOST form) Yellow form placed in chart (order not valid for inpatient use)     Chief Complaint  Patient presents with  . Acute Visit    discoloration of BLE    HPI:  Pt is a 83 y.o. male seen today for an acute visit for Hx of PVD, chronic edema and discoloration, no open wound, no change in appearance from last visited. T2DM 03/29/17 Hgb a1c 6.7, taking Metformin 569m bid. 05/01/17 microalbumin/creatinine 20(ADA normal <30)    Hx of Afib, heart rate is in control, dementia, depression, stable, taking Risperdal. HTN, controlled while on furosemide 187mand Spironolactone 12.53m34manemia, Hgb 7.6,  dropped from 11.4 12/22/15, last Hgb 13.8 03/29/17, off Iron, ASA/Elliquis dc'd, CHF BNP 1449. compensated, BNP 745.9 02/04/17,on Furosemide 30m77md Spironolactone 12.53mg 46mly. Hypothyroidism, taking Levothyroxine 253mcg29mH 4.27 01/21/17, 4.71 03/29/17.  Improved agitation and restlessness, taking Risperdal 0.253mg q83mT2DM 03/29/17 Hgb a1c 6.7 Past Medical History:  Diagnosis Date  . ANEMIA-IRON DEFICIENCY 05/27/2007  . ANXIETY 11/14/2007  . BENIGN PROSTATIC HYPERTROPHY 11/14/2007  . CAROTID ARTERY DISEASE 10/02/2010  . Chronic combined systolic and diastolic CHF (congestive heart failure) (HCC) 10[place]/2015  . CKD stage 3 due to type 1 diabetes mellitus (HCC) 3/[place]2016  . COLONIC POLYPS, HX OF 05/27/2007  . CORONARY ARTERY DISEASE 11/14/2007   prior bypass [phone] MI 11/07; Severe 3 vessel disease; occluded SVG to RCA; SVG to OM subtotalled, SVG to diagonal with ostial 70 and 80 mid; EF 45. PCI of SVG to OM. FU myovue showed EF 56 with inferior infarct and very mild peri-infarct ischemia  . DEPRESSION 11/14/2007  . DIABETES MELLITUS, TYPE II 11/14/2007  . DIVERTICULITIS, HX OF 05/27/2007  . DM (diabetes mellitus), type 2 with renal complications (HCC) 5/[place]204/28/7681 (deep venous thrombosis) (HCC) 3/[place]2012  . Dyspnea 11/09/2014  . Effusion of left knee 03/17/2015  . Failure to thrive in adult 05/03/2015  . FOOT PAIN, BILATERAL 12/08/2010  . Generalized weakness 05/20/2015  . GERD 11/14/2007  . Hearing loss 05/13/2009   Qualifier: Diagnosis of  By: John MDJenny Reichmannmes WHunt Orisperlipemia 11/14/2007   Qualifier: Diagnosis of  By: John MDJenny Reichmannmes WHunt OrisYPERLIPIDEMIA 11/14/2007  . HYPERTENSION 11/14/2007  . HYPOTENSION, ORTHOSTATIC  03/06/2011  . Intermittent high-grade heart block 09/05/2009   s/p pacemaker  . Ischemic cardiomyopathy    a. echo (10/15):  mod focal basal septal hypertrophy, EF 40-45%, inf AK, Gr 1 DD, Al sclerosis, mild AI, MAC, mild MR, mod LAE, lipomatous hypertrophy,  small effusion (no hemodynamic compromise)   . Memory loss 06/01/2008  . Myalgia and myositis, unspecified 04/24/2011  . Osteoarthrosis, unspecified whether generalized or localized, unspecified site   . OSTEOPOROSIS 06/02/2010  . Other malaise and fatigue 04/24/2011  . Pacemaker-St. Jude 02/02/2008   St. Jude  . Peripheral neuropathy 03/27/2011  . PERIPHERAL VASCULAR DISEASE 11/14/2007  . Prostate cancer ([place])   . Protein-calorie malnutrition, severe ([place]) 05/23/2015  . RENAL INSUFFICIENCY 11/14/2007  . Seborrheic keratosis 06/05/2011  . SECONDARY DM W/PERIPHERAL CIRC D/O UNCONTROLLED 11/07/2010  . Severe back pain   . SYNCOPE 09/08/2008  . Unspecified hearing loss 05/13/2009  . Vertebral compression fracture ([place]) 09/05/2009   Qualifier: Diagnosis of  By: Jenny Reichmann MD, Hunt Oris   . [name] 04/04/2015  . Weight loss 10/10/2014   Past Surgical History:  Procedure Laterality Date  . APPENDECTOMY    . CHOLECYSTECTOMY    . CORONARY ARTERY BYPASS GRAFT  2007  . CYSTOSCOPY    . PACEMAKER INSERTION  2012   Caryl Comes, MD  . prostate needle biopsy    . s/p bilat knee replacement  1992   Paul, MD  . s/p coronary stent x 1      Allergies  Allergen Reactions  . Hydrocodone     hallucination  . Tramadol     hallucination    Allergies as of 05/14/2017      Reactions   Hydrocodone    hallucination   Tramadol    hallucination      Medication List       Accurate as of 05/14/17 11:59 PM. Always use your most recent med list.          BOOST DIABETIC Liqd Take 237 mLs by mouth 3 (three) times daily between meals.   carbamide peroxide 6.5 % otic solution Commonly known as:  DEBROX 5 drops. Instil 5 - 10 drops in each ear at bedtime 3 times a week every other month   docusate sodium 100 MG capsule Commonly known as:  COLACE Take 100 mg by mouth 2 (two) times daily.   famotidine 20 MG tablet Commonly known as:  PEPCID Take 20 mg by mouth at bedtime. For GERD   feeding supplement (PRO-STAT SUGAR  FREE 64) Liqd Take 30 mLs by mouth 3 (three) times daily with meals.   fentaNYL 25 MCG/HR patch Commonly known as:  DURAGESIC Apply fresh patch every 3 days to help pain. Remove old patch.   furosemide 20 MG tablet Commonly known as:  LASIX Take 10 mg by mouth daily.   levothyroxine 25 MCG tablet Commonly known as:  SYNTHROID, LEVOTHROID Take 25 mcg by mouth daily.   metFORMIN 500 MG tablet Commonly known as:  GLUCOPHAGE Take 500 mg by mouth. Take one twice a day   nitroGLYCERIN 0.4 MG SL tablet Commonly known as:  NITROSTAT Place 0.4 mg under the tongue every 5 (five) minutes as needed for chest pain.   OXYGEN Inhale 1 L/min into the lungs as needed.   risperiDONE 0.25 MG tablet Commonly known as:  RISPERDAL Take 0.25 mg by mouth at bedtime.   spironolactone 25 MG tablet Commonly known as:  ALDACTONE Take 12.5 mg by mouth daily.  SYSTANE BALANCE 0.6 % Soln Generic drug:  Propylene Glycol Apply to eye. One drop both eyes 2 times a day due to dry eyes   zinc oxide 20 % ointment Apply 1 application topically. Apply ointment twice daily as needed to buttocks , apply to boney prominences mid spine three times daily as needed       Review of Systems  Constitutional: Negative for chills and fever.  HENT: Positive for hearing loss. Negative for ear pain.   Eyes: Negative for photophobia and pain.       Corrective lenses  Respiratory: Positive for shortness of breath. Negative for cough and wheezing.        O2 dependent  Cardiovascular: Positive for leg swelling.       Pacemaker. History of syncope related to cardiac arrhythmia. BLE edema trace R>L  Gastrointestinal: Negative for abdominal pain.  Endocrine: Negative for polydipsia.  Genitourinary: Positive for frequency. Negative for dysuria, flank pain and urgency.       Underwent Urology evaluation in the past, off meds. Episodes of incontinence persist.  Musculoskeletal: Positive for back pain, gait problem,  myalgias and neck pain (Fall on 03/15/15 causing pain in the left knee and effusion.).       Poor balance and gait disturbance. Lower back pain Sever kyphosis  Skin:       Rough area on left cheek. Spontaneous ecchymoses in all extremities.     Neurological: Negative for tremors, seizures and weakness.       Memory loss  Psychiatric/Behavioral:       Mild to moderate memory disturbance.    Immunization History  Administered Date(s) Administered  . Influenza Whole 09/23/2010, 09/23/2012  . Influenza,inj,Quad PF,36+ Mos 10/09/2014  . Influenza-Unspecified 09/10/2013, 09/30/2015, 10/05/2016  . PPD Test 05/23/2015  . Pneumococcal Polysaccharide-23 03/31/2009  . Td 03/31/2009   Pertinent  Health Maintenance Due  Topic Date Due  . OPHTHALMOLOGY EXAM  06/26/1933  . PNA vac Low Risk Adult (2 of 2 - PCV13) 03/31/2010  . FOOT EXAM  03/16/2016  . INFLUENZA VACCINE  07/24/2017  . HEMOGLOBIN A1C  09/28/2017  . URINE MICROALBUMIN  04/30/2018   Fall Risk  09/26/2015 05/26/2015 04/29/2015 04/04/2015 03/17/2015  Falls in the past year? Yes Yes Yes Yes Yes  Number falls in past yr: 1 1 1 1 2  or more  Injury with Fall? No Yes Yes Yes Yes  Risk Factor Category  - High Fall Risk - High Fall Risk High Fall Risk  Risk for fall due to : Impaired balance/gait History of fall(s);Impaired balance/gait;Impaired mobility - History of fall(s);Impaired balance/gait;Impaired mobility History of fall(s);Impaired balance/gait;Impaired mobility  Follow up Falls evaluation completed Falls evaluation completed - Falls evaluation completed Falls evaluation completed   Functional Status Survey:    Vitals:   05/14/17 1233  BP: 138/75  Pulse: 70  Resp: 17  Temp: 98.1 F (36.7 C)  SpO2: 94%  Weight: 164 lb (74.4 kg)  Height: 5' 9"  (1.753 m)   Body mass index is 24.22 kg/m. Physical Exam  Constitutional: He is oriented to person, place, and time.  Frail  HENT:  Head: Normocephalic and atraumatic.  Right  Ear: No drainage, swelling or tenderness. No foreign bodies. No mastoid tenderness. Tympanic membrane is not injected, not scarred, not perforated, not erythematous, not retracted and not bulging. Tympanic membrane mobility is normal. No hemotympanum. Decreased hearing is noted.  Left Ear: No drainage, swelling or tenderness. No foreign bodies. No mastoid tenderness. Tympanic membrane is  not injected, not scarred, not perforated, not erythematous, not retracted and not bulging. Tympanic membrane mobility is normal. No hemotympanum. Decreased hearing is noted.  Nose: Nose normal.  Bilateral hearing aids.  Eyes: Conjunctivae are normal. Pupils are equal, round, and reactive to light.  Prescription lenses.  Neck: Normal range of motion. Neck supple. No JVD present. No thyromegaly present.  Cardiovascular: Normal rate and regular rhythm.  Exam reveals no gallop and no friction rub.   Murmur heard.  Systolic murmur is present with a grade of 1/6  Absent DP and PT  Pulmonary/Chest: Effort normal. He has no wheezes. He has rales. He exhibits no tenderness.  Using portable oxygen at 2 L/m today. Moist rales posterior mid to lower lungs.   Abdominal: Soft. Bowel sounds are normal. He exhibits no distension. There is no tenderness.  Musculoskeletal: Normal range of motion. He exhibits edema. He exhibits no tenderness.  Pain in the left SI joint area. Limps when walking. Favors the left side. Effusion of the left knee. Scars in the left knee from previous surgery. Tender at the left knee with movement of the patella. Positional lower back pain Server kyphosis RLE edema trace, PVD, discoloration(bluish)  Neurological: He is alert and oriented to person, place, and time. He has normal strength. A sensory deficit is present. No cranial nerve deficit. Gait abnormal.  07/13/14 MMSE 28/30. Passed clock drawing. Diminshed sensation to vabration and monofilament.  Skin: Skin is warm and dry. No erythema. No  pallor.  spontaneous ecchymoses in all extremities.      Psychiatric: He has a normal mood and affect. His behavior is normal. Thought content normal.  Patient is in denial regarding the extent of his memory deficit.    Labs reviewed:  Recent Labs  11/01/16 03/29/17 04/11/17  NA 139 137 134*  K 4.4 4.0 4.4  BUN 48* 48* 57*  CREATININE 1.2 1.1 1.3    Recent Labs  11/01/16 03/29/17  AST 22 16  ALT 14 8*  ALKPHOS 106 112    Recent Labs  05/31/16 11/01/16 03/29/17  WBC 6.6 5.6 6.4  HGB 14.2 14.4 13.8  HCT 42 43 41  PLT 172 124* 132*   Lab Results  Component Value Date   TSH 4.71 03/29/2017   Lab Results  Component Value Date   HGBA1C 6.7 03/29/2017   Lab Results  Component Value Date   CHOL 132 02/07/2015   HDL 33 (A) 02/07/2015   LDLCALC 76 02/07/2015   LDLDIRECT 85.6 07/19/2010   TRIG 115 02/07/2015   CHOLHDL 4 03/06/2011    Significant Diagnostic Results in last 30 days:  No results found.  Assessment/Plan DM type 2 with diabetic peripheral neuropathy (HCC) 03/29/17 Hgb a1c 6.7 Continue Metformin 560m bid.   Hypothyroidism continue Levothyroxine 259m daily. Last TSH 4.71 03/30/17  Essential hypertension Controlled, continue Furosemide 1077mnd Spironolactone 12.5mg38mily  Peripheral vascular disease chronic edema and discoloration, no open wound, no change in appearance from last visited. T2DM 03/29/17 Hgb a1c 6.7, taking Metformin 500mg68m.    Chronic combined systolic and diastolic CHF (congestive heart failure) (HCC) Continue Furosemide 10mg,64mronolactone 12.5mg.  47m9/18 Na 134, K 4.4, Bun 57, creat 1.28 02/09/16 BNP 1959.3 02/04/17 BNP 745.9 Compensating clinically.     Paroxysmal atrial fibrillation (HCC) Rate is in control w/o medications. off Eliquis 2nd to Hgb 7s.   GERD Stable, continue Famotidine 20mg da42m  Hgb dropped from 11.4 12/12/15 to 7.6 01/02/16, resolved, Hgb 13.8 03/29/17, continue  to be off  ASA/Elliquis/Fe  Constipation Stable, continue 06/10/15 Lactulose prn. Colace bid.   DM (diabetes mellitus), type 2 with renal complications (HCC) 2/70/35 creat 1.28, 05/01/17 microalbumin/creatinine 20(ADA normal <30)  Vertebral compression fracture Controlled pain, continue Fentanyl transdermal patch  Nodular prostate with urinary obstruction Urinary leakage  Depression Stabilized, continue Risperdal to 0.36m qod, observe for returning of targeted symptoms.   Memory loss SNF for care assistance.      Family/ staff Communication: SNF  Labs/tests ordered:  none

## 2017-05-14 NOTE — Assessment & Plan Note (Signed)
continue Levothyroxine 69mcg daily. Last TSH 4.71 03/30/17

## 2017-05-14 NOTE — Assessment & Plan Note (Signed)
Stable, continue Famotidine 20mg  daily,  Hgb dropped from 11.4 12/12/15 to 7.6 01/02/16, resolved, Hgb 13.8 03/29/17, continue to be off ASA/Elliquis/Fe

## 2017-05-14 NOTE — Assessment & Plan Note (Signed)
chronic edema and discoloration, no open wound, no change in appearance from last visited. T2DM 03/29/17 Hgb a1c 6.7, taking Metformin 500mg  bid.

## 2017-05-14 NOTE — Assessment & Plan Note (Signed)
Controlled, continue Furosemide 10mg  and Spironolactone 12.5mg  daily

## 2017-05-14 NOTE — Assessment & Plan Note (Signed)
Controlled pain, continue Fentanyl transdermal patch

## 2017-05-14 NOTE — Assessment & Plan Note (Signed)
Urinary leakage.  

## 2017-05-14 NOTE — Assessment & Plan Note (Signed)
Rate is in control w/o medications. off Eliquis 2nd to Hgb 7s.

## 2017-05-14 NOTE — Assessment & Plan Note (Addendum)
Continue Furosemide 10mg , Spironolactone 12.5mg .  04/11/17 Na 134, K 4.4, Bun 57, creat 1.28 02/09/16 BNP 1959.3 02/04/17 BNP 745.9 Compensating clinically.

## 2017-05-29 ENCOUNTER — Non-Acute Institutional Stay (SKILLED_NURSING_FACILITY): Payer: Medicare Other | Admitting: Family

## 2017-05-29 ENCOUNTER — Encounter: Payer: Self-pay | Admitting: Family

## 2017-05-29 DIAGNOSIS — I2510 Atherosclerotic heart disease of native coronary artery without angina pectoris: Secondary | ICD-10-CM | POA: Diagnosis not present

## 2017-05-29 DIAGNOSIS — E039 Hypothyroidism, unspecified: Secondary | ICD-10-CM

## 2017-05-29 DIAGNOSIS — I5042 Chronic combined systolic (congestive) and diastolic (congestive) heart failure: Secondary | ICD-10-CM

## 2017-05-29 DIAGNOSIS — I10 Essential (primary) hypertension: Secondary | ICD-10-CM | POA: Diagnosis not present

## 2017-05-29 DIAGNOSIS — E1142 Type 2 diabetes mellitus with diabetic polyneuropathy: Secondary | ICD-10-CM | POA: Diagnosis not present

## 2017-05-29 NOTE — Progress Notes (Addendum)
Location:  [facility] Room Number: 9 Place of Service:  SNF (31) Provider: Janise Gora FNP-C   Blanchie Serve, MD  Patient Care Team: Blanchie Serve, MD as PCP - General (Internal Medicine) Irine Seal, MD as Consulting Physician (Urology) Verl Blalock, Marijo Conception, MD (Inactive) as Consulting Physician (Cardiology) [place], Denice Bors, MD as Consulting Physician (Cardiology) Mast, Man X, NP as Nurse Practitioner (Nurse Practitioner) Rigoberto Noel, MD as Consulting Physician (Pulmonary Disease)  Extended Emergency Contact Information Primary Emergency Contact: Kathline Magic, Rosezella Rumpf of [place] Phone: [phone] Relation: Daughter Secondary Emergency Contact: Johnnette Gourd States of [place] Phone: [phone] Mobile Phone: [phone] Relation: Son  Code Status:  DNR  Goals of care: Advanced Directive information Advanced Directives 05/29/2017  Does Patient Have a Medical Advance Directive? Yes  Type of Advance Directive Out of facility DNR (pink MOST or yellow form);[place];Living will  Does patient want to make changes to medical advance directive? -  Copy of [place] in Chart? -  Pre-existing out of facility DNR order (yellow form or pink MOST form) -     Chief Complaint  Patient presents with  . Medical Management of Chronic Issues    Routine visit; address eye exam, foot exam and Prevnar    HPI:  Pt is a 83 y.o. male seen today [facility] for medical management of chronic diseases.He has a medical history of HTN, CAD, CHF, Hyperlipidemia, Type 2 DM, Hypothyroidism,PVD,GERD, CKD, Anxiety among other conditions. He is seen in his room today with sitter at bedside.He denies any acute issues this visit though falls asleep during visit. Facility Nurse reports no recent fall episode, weight changes or acute illness.       Past Medical History:    Diagnosis Date  . ANEMIA-IRON DEFICIENCY 05/27/2007  . ANXIETY 11/14/2007  . BENIGN PROSTATIC HYPERTROPHY 11/14/2007  . CAROTID ARTERY DISEASE 10/02/2010  . Chronic combined systolic and diastolic CHF (congestive heart failure) ([place]) 10/10/2014  . CKD stage 3 due to type 1 diabetes mellitus ([place]) 03/17/2015  . COLONIC POLYPS, HX OF 05/27/2007  . CORONARY ARTERY DISEASE 11/14/2007   prior bypass [phone]; Inferior MI 11/07; Severe 3 vessel disease; occluded SVG to RCA; SVG to OM subtotalled, SVG to diagonal with ostial 70 and 80 mid; EF 45. PCI of SVG to OM. FU myovue showed EF 56 with inferior infarct and very mild peri-infarct ischemia  . DEPRESSION 11/14/2007  . DIABETES MELLITUS, TYPE II 11/14/2007  . DIVERTICULITIS, HX OF 05/27/2007  . DM (diabetes mellitus), type 2 with renal complications ([place]) 8/41/3244  . DVT (deep venous thrombosis) ([place]) 03/21/2011  . Dyspnea 11/09/2014  . Effusion of left knee 03/17/2015  . Failure to thrive in adult 05/03/2015  . FOOT PAIN, BILATERAL 12/08/2010  . Generalized weakness 05/20/2015  . GERD 11/14/2007  . Hearing loss 05/13/2009   Qualifier: Diagnosis of  By: Jenny Reichmann MD, Hunt Oris   . Hyperlipemia 11/14/2007   Qualifier: Diagnosis of  By: Jenny Reichmann MD, Hunt Oris    . HYPERLIPIDEMIA 11/14/2007  . HYPERTENSION 11/14/2007  . HYPOTENSION, ORTHOSTATIC 03/06/2011  . Intermittent high-grade heart block 09/05/2009   s/p pacemaker  . Ischemic cardiomyopathy    a. echo (10/15):  mod focal basal septal hypertrophy, EF 40-45%, inf AK, Gr 1 DD, Al sclerosis, mild AI, MAC, mild MR, mod LAE, lipomatous hypertrophy, small effusion (no  hemodynamic compromise)   . Memory loss 06/01/2008  . Myalgia and myositis, unspecified 04/24/2011  . Osteoarthrosis, unspecified whether generalized or localized, unspecified site   . OSTEOPOROSIS 06/02/2010  . Other malaise and fatigue 04/24/2011  . Pacemaker-St. Jude 02/02/2008   St. Jude  . Peripheral neuropathy 03/27/2011  . PERIPHERAL VASCULAR  DISEASE 11/14/2007  . Prostate cancer ([place])   . Protein-calorie malnutrition, severe ([place]) 05/23/2015  . RENAL INSUFFICIENCY 11/14/2007  . Seborrheic keratosis 06/05/2011  . SECONDARY DM W/PERIPHERAL CIRC D/O UNCONTROLLED 11/07/2010  . Severe back pain   . SYNCOPE 09/08/2008  . Unspecified hearing loss 05/13/2009  . Vertebral compression fracture ([place]) 09/05/2009   Qualifier: Diagnosis of  By: Jenny Reichmann MD, Hunt Oris   . [name] 04/04/2015  . Weight loss 10/10/2014   Past Surgical History:  Procedure Laterality Date  . APPENDECTOMY    . CHOLECYSTECTOMY    . CORONARY ARTERY BYPASS GRAFT  2007  . CYSTOSCOPY    . PACEMAKER INSERTION  2012   Caryl Comes, MD  . prostate needle biopsy    . s/p bilat knee replacement  1992   Paul, MD  . s/p coronary stent x 1      Allergies  Allergen Reactions  . Hydrocodone     hallucination  . Tramadol     hallucination    Allergies as of 05/29/2017      Reactions   Hydrocodone    hallucination   Tramadol    hallucination      Medication List       Accurate as of 05/29/17  3:39 PM. Always use your most recent med list.          BOOST DIABETIC Liqd Take 237 mLs by mouth 3 (three) times daily between meals.   carbamide peroxide 6.5 % OTIC solution Commonly known as:  DEBROX 5 drops. Instil 5 - 10 drops in each ear at bedtime 3 times a week every other month   docusate sodium 100 MG capsule Commonly known as:  COLACE Take 100 mg by mouth 2 (two) times daily.   famotidine 20 MG tablet Commonly known as:  PEPCID Take 20 mg by mouth daily as needed for heartburn. For GERD   feeding supplement (PRO-STAT SUGAR FREE 64) Liqd Take 30 mLs by mouth 3 (three) times daily with meals.   fentaNYL 25 MCG/HR patch Commonly known as:  DURAGESIC Apply fresh patch every 3 days to help pain. Remove old patch.   furosemide 20 MG tablet Commonly known as:  LASIX Take 20 mg by mouth daily.   levothyroxine 25 MCG tablet Commonly known as:  SYNTHROID,  LEVOTHROID Take 25 mcg by mouth daily.   metFORMIN 500 MG tablet Commonly known as:  GLUCOPHAGE Take 500 mg by mouth. Take one twice a day   nitroGLYCERIN 0.4 MG SL tablet Commonly known as:  NITROSTAT Place 0.4 mg under the tongue every 5 (five) minutes as needed for chest pain.   OXYGEN Inhale 1 L/min into the lungs as needed.   risperiDONE 0.25 MG tablet Commonly known as:  RISPERDAL Take 0.25 mg by mouth at bedtime.   spironolactone 25 MG tablet Commonly known as:  ALDACTONE Take 25 mg by mouth daily.   SYSTANE BALANCE 0.6 % Soln Generic drug:  Propylene Glycol Apply to eye. One drop both eyes 2 times a day due to dry eyes   zinc oxide 20 % ointment Apply 1 application topically. Apply ointment twice daily as needed to buttocks ,  apply to boney prominences mid spine three times daily as needed       Review of Systems  Constitutional: Negative for activity change, appetite change, chills, fatigue and fever.  HENT: Negative for congestion, rhinorrhea, sinus pain, sinus pressure, sneezing and sore throat.   Eyes: Negative.   Respiratory: Negative for cough, chest tightness, shortness of breath and wheezing.   Cardiovascular: Positive for leg swelling. Negative for chest pain and palpitations.  Gastrointestinal: Negative for abdominal distention, abdominal pain, constipation, diarrhea, nausea and vomiting.  Endocrine: Negative.   Genitourinary: Negative for dysuria, flank pain, frequency and urgency.  Musculoskeletal: Positive for gait problem.  Skin: Negative for color change, pallor and rash.  Neurological: Negative for dizziness, seizures, syncope, light-headedness and headaches.  Hematological: Does not bruise/bleed easily.  Psychiatric/Behavioral: Negative for agitation, confusion, hallucinations and sleep disturbance. The patient is not nervous/anxious.     Immunization History  Administered Date(s) Administered  . Influenza Whole 09/23/2010, 09/23/2012  .  Influenza,inj,Quad PF,36+ Mos 10/09/2014  . Influenza-Unspecified 09/10/2013, 09/30/2015, 10/05/2016  . PPD Test 05/23/2015  . Pneumococcal Polysaccharide-23 03/31/2009  . Td 03/31/2009   Pertinent  Health Maintenance Due  Topic Date Due  . OPHTHALMOLOGY EXAM  06/26/1933  . PNA vac Low Risk Adult (2 of 2 - PCV13) 03/31/2010  . FOOT EXAM  03/16/2016  . INFLUENZA VACCINE  07/24/2017  . HEMOGLOBIN A1C  09/28/2017  . URINE MICROALBUMIN  04/30/2018   Fall Risk  09/26/2015 05/26/2015 04/29/2015 04/04/2015 03/17/2015  Falls in the past year? _0   Number falls in past yr: _1 or more  Injury with Fall? No Yes Yes Yes Yes  Risk Factor Category  - High Fall Risk - High Fall Risk High Fall Risk  Risk for fall due to : Impaired balance/gait History of fall(s);Impaired balance/gait;Impaired mobility - History of fall(s);Impaired balance/gait;Impaired mobility History of fall(s);Impaired balance/gait;Impaired mobility  Follow up Falls evaluation completed Falls evaluation completed - Falls evaluation completed Falls evaluation completed    Vitals:   05/29/17 1005  BP: 134/72  Pulse: 76  Resp: 18  Temp: 98 F (36.7 C)  SpO2: 96%  Weight: 155 lb (70.3 kg)  Height: _2  (1.753 m)   Body mass index is 22.89 kg/m. Physical Exam  Constitutional: He is oriented to person, place, and time. He appears well-developed and well-nourished. No distress.  HENT:  Head: Normocephalic.  Mouth/Throat: Oropharynx is clear and moist. No oropharyngeal exudate.  Eyes: Conjunctivae and EOM are normal. Pupils are equal, round, and reactive to light. Right eye exhibits no discharge. Left eye exhibits no discharge. No scleral icterus.  Neck: Normal range of motion. No JVD present. No thyromegaly present.  Cardiovascular: Intact distal pulses.  Exam reveals no gallop and no friction rub.   Murmur heard. Pace maker   Pulmonary/Chest: Effort normal and breath sounds normal. No respiratory  distress. He has no wheezes. He has no rales.  Abdominal: Soft. Bowel sounds are normal. He exhibits no distension. There is no tenderness. There is no rebound and no guarding.  Musculoskeletal: He exhibits no tenderness or deformity.  Moves x 4 extremities. Bilateral lower extremities trace edema.ambulates with FWW   Lymphadenopathy:    He has no cervical adenopathy.  Neurological: He is oriented to person, place, and time.  Skin: Skin is warm and dry. No erythema. No pallor.  Psychiatric: He has a normal mood and affect.   Labs reviewed:  Recent Labs  11/01/16 03/29/17  04/11/17  NA 139 137 134*  K 4.4 4.0 4.4  BUN 48* 48* 57*  CREATININE 1.2 1.1 1.3    Recent Labs  11/01/16 03/29/17  AST 22 16  ALT 14 8*  ALKPHOS 106 112    Recent Labs  05/31/16 11/01/16 03/29/17  WBC 6.6 5.6 6.4  HGB 14.2 14.4 13.8  HCT 42 43 41  PLT 172 124* 132*   Lab Results  Component Value Date   TSH 4.71 03/29/2017   Lab Results  Component Value Date   HGBA1C 6.7 03/29/2017   Lab Results  Component Value Date   CHOL 132 02/07/2015   HDL 33 (A) 02/07/2015   LDLCALC 76 02/07/2015   LDLDIRECT 85.6 07/19/2010   TRIG 115 02/07/2015   CHOLHDL 4 03/06/2011   Assessment/Plan 1. Essential hypertension B/P stable. Continue on spironolactone 25 mg Tablet daily. Monitor BMP   2. DM type 2 with diabetic peripheral neuropathy Lab Results  Component Value Date   HGBA1C 6.7 03/29/2017  Facility CBG log reviewed CBG's ranging in the 110's. Will continue on Metformin 500 mg Tablet .Recent microalbumin/Creatinine ration was within range ( 20) 05/01/2017. Awaiting foot and eye exam ordered 04/2017.    3. Hypothyroidism Lab Results  Component Value Date   TSH 4.71 03/29/2017  Continue on Levothyroxine 25 mcg Tablet daily. Continue to monitor TSH level.   4. Chronic combined systolic and diastolic CHF (congestive heart failure)  No recent abrupt weight gain. Exam findings negative for cough,  shortness of breath, wheezing or rales. Will continue on Spironolactone 25 mg tablet daily and Furosemide 20 mg Tablet daily. Continue to monitor  Weight.   5. Coronary artery disease Chest pain free.Continue to control high risks factors.continue on Nitroglycerin as needed.    Family/ staff Communication: Reviewed plan of care with patient and facility Nurse supervisor   Labs/tests ordered: None   Sandrea Hughs, NP

## 2017-06-03 ENCOUNTER — Non-Acute Institutional Stay (SKILLED_NURSING_FACILITY): Payer: Medicare Other | Admitting: Family

## 2017-06-03 ENCOUNTER — Encounter: Payer: Self-pay | Admitting: Family

## 2017-06-03 DIAGNOSIS — L570 Actinic keratosis: Secondary | ICD-10-CM | POA: Diagnosis not present

## 2017-06-03 NOTE — Progress Notes (Signed)
Location:  [facility] Room Number: 9 Place of Service:  SNF (31) Provider: Dinah Ngetich FNP-C   Blanchie Serve, MD  Patient Care Team: Blanchie Serve, MD as PCP - General (Internal Medicine) Irine Seal, MD as Consulting Physician (Urology) Verl Blalock, Marijo Conception, MD (Inactive) as Consulting Physician (Cardiology) [place] C[facility], Denice Bors, MD as Consulting Physician (Cardiology) Mast, Man X, NP as Nurse Practitioner (Nurse Practitioner) Rigoberto Noel, MD as Consulting Physician (Pulmonary Disease)  Extended Emergency Contact Information Primary Emergency Contact: Kathline Magic, Rosezella Rumpf of [place] Phone: [phone] Relation: Daughter Secondary Emergency Contact: Johnnette Gourd [place] of [place] Phone: [phone] Mobile Phone: [phone] Relation: Son  Code Status:  DNR  Goals of care: Advanced Directive information Advanced Directives 06/03/2017  Does Patient Have a Medical Advance Directive? Yes  Type of Paramedic of [place];Living will;Out of facility DNR (pink MOST or yellow form)  Does patient want to make changes to medical advance directive? -  Copy of [place] in Chart? Yes  Pre-existing out of facility DNR order (yellow form or pink MOST form) Yellow form placed in chart (order not valid for inpatient use)     Chief Complaint  Patient presents with  . Acute Visit    knot on back    HPI:  Pt is a 83 y.o. male seen today [place] for acute visit for evaluation of lesion on the back. He is seen in his room today per facility Nurse request. Facility Nurse reports patient has a growth on the left back area.He denies any acute issues this visit. Patient sitter at bedside during visit. She reports no acute issues.  Past Medical History:  Diagnosis Date  . ANEMIA-IRON DEFICIENCY 05/27/2007  . ANXIETY 11/14/2007  . BENIGN PROSTATIC  HYPERTROPHY 11/14/2007  . CAROTID ARTERY DISEASE 10/02/2010  . Chronic combined systolic and diastolic CHF (congestive heart failure) ([place]) 10/10/2014  . CKD stage 3 due to type 1 diabetes mellitus ([place]) 03/17/2015  . COLONIC POLYPS, HX OF 05/27/2007  . CORONARY ARTERY DISEASE 11/14/2007   prior bypass [phone]; Inferior MI 11/07; Severe 3 vessel disease; occluded SVG to RCA; SVG to OM subtotalled, SVG to diagonal with ostial 70 and 80 mid; EF 45. PCI of SVG to OM. FU myovue showed EF 56 with inferior infarct and very mild peri-infarct ischemia  . DEPRESSION 11/14/2007  . DIABETES MELLITUS, TYPE II 11/14/2007  . DIVERTICULITIS, HX OF 05/27/2007  . DM (diabetes mellitus), type 2 with renal complications ([place]) 5/83/0940  . DVT (deep venous thrombosis) ([place]) 03/21/2011  . Dyspnea 11/09/2014  . Effusion of left knee 03/17/2015  . Failure to thrive in adult 05/03/2015  . FOOT PAIN, BILATERAL 12/08/2010  . Generalized weakness 05/20/2015  . GERD 11/14/2007  . Hearing loss 05/13/2009   Qualifier: Diagnosis of  By: Jenny Reichmann MD, Hunt Oris   . Hyperlipemia 11/14/2007   Qualifier: Diagnosis of  By: Jenny Reichmann MD, Hunt Oris    . HYPERLIPIDEMIA 11/14/2007  . HYPERTENSION 11/14/2007  . HYPOTENSION, ORTHOSTATIC 03/06/2011  . Intermittent high-grade heart block 09/05/2009   s/p pacemaker  . Ischemic cardiomyopathy    a. echo (10/15):  mod focal basal septal hypertrophy, EF 40-45%, inf AK, Gr 1 DD, Al sclerosis, mild AI, MAC, mild MR, mod LAE, lipomatous hypertrophy, small effusion (no hemodynamic compromise)   . Memory loss 06/01/2008  . Myalgia and myositis,  unspecified 04/24/2011  . Osteoarthrosis, unspecified whether generalized or localized, unspecified site   . OSTEOPOROSIS 06/02/2010  . Other malaise and fatigue 04/24/2011  . Pacemaker-St. Jude 02/02/2008   St. Jude  . Peripheral neuropathy 03/27/2011  . PERIPHERAL VASCULAR DISEASE 11/14/2007  . Prostate cancer ([place])   . Protein-calorie malnutrition, severe ([place])  05/23/2015  . RENAL INSUFFICIENCY 11/14/2007  . Seborrheic keratosis 06/05/2011  . SECONDARY DM W/PERIPHERAL CIRC D/O UNCONTROLLED 11/07/2010  . Severe back pain   . SYNCOPE 09/08/2008  . Unspecified hearing loss 05/13/2009  . Vertebral compression fracture ([place]) 09/05/2009   Qualifier: Diagnosis of  By: Jenny Reichmann MD, Hunt Oris   . [name] 04/04/2015  . Weight loss 10/10/2014   Past Surgical History:  Procedure Laterality Date  . APPENDECTOMY    . CHOLECYSTECTOMY    . CORONARY ARTERY BYPASS GRAFT  2007  . CYSTOSCOPY    . PACEMAKER INSERTION  2012   Caryl Comes, MD  . prostate needle biopsy    . s/p bilat knee replacement  1992   Paul, MD  . s/p coronary stent x 1      Allergies  Allergen Reactions  . Hydrocodone     hallucination  . Tramadol     hallucination    Allergies as of 06/03/2017      Reactions   Hydrocodone    hallucination   Tramadol    hallucination      Medication List       Accurate as of 06/03/17  2:09 PM. Always use your most recent med list.          BOOST DIABETIC Liqd Take 237 mLs by mouth 3 (three) times daily between meals.   carbamide peroxide 6.5 % OTIC solution Commonly known as:  DEBROX 5 drops. Instil 5 - 10 drops in each ear at bedtime 3 times a week every other month   docusate sodium 100 MG capsule Commonly known as:  COLACE Take 100 mg by mouth 2 (two) times daily.   famotidine 20 MG tablet Commonly known as:  PEPCID Take 20 mg by mouth daily as needed for heartburn. For GERD   feeding supplement (PRO-STAT SUGAR FREE 64) Liqd Take 30 mLs by mouth 3 (three) times daily with meals.   fentaNYL 25 MCG/HR patch Commonly known as:  DURAGESIC Apply fresh patch every 3 days to help pain. Remove old patch.   furosemide 20 MG tablet Commonly known as:  LASIX Take 20 mg by mouth daily.   levothyroxine 25 MCG tablet Commonly known as:  SYNTHROID, LEVOTHROID Take 25 mcg by mouth daily.   metFORMIN 500 MG tablet Commonly known as:   GLUCOPHAGE Take 500 mg by mouth. Take one twice a day   nitroGLYCERIN 0.4 MG SL tablet Commonly known as:  NITROSTAT Place 0.4 mg under the tongue every 5 (five) minutes as needed for chest pain.   OXYGEN Inhale 1 L/min into the lungs as needed.   risperiDONE 0.25 MG tablet Commonly known as:  RISPERDAL Take 0.25 mg by mouth at bedtime.   spironolactone 25 MG tablet Commonly known as:  ALDACTONE Take 25 mg by mouth daily.   SYSTANE BALANCE 0.6 % Soln Generic drug:  Propylene Glycol Apply to eye. One drop both eyes 2 times a day due to dry eyes   zinc oxide 20 % ointment Apply 1 application topically. Apply ointment twice daily as needed to buttocks , apply to boney prominences mid spine three times daily as needed  Review of Systems  Constitutional: Negative for activity change, appetite change, chills, fatigue and fever.  HENT: Negative for congestion, rhinorrhea, sinus pain, sinus pressure, sneezing and sore throat.   Eyes: Negative.   Respiratory: Negative for cough, chest tightness, shortness of breath and wheezing.   Cardiovascular: Positive for leg swelling. Negative for chest pain and palpitations.  Gastrointestinal: Negative for abdominal distention, abdominal pain, constipation, diarrhea, nausea and vomiting.  Genitourinary: Negative for dysuria, flank pain, frequency and urgency.  Skin: Negative for color change, pallor and rash.       Left back area growth per HPI  Neurological: Negative for dizziness, seizures, syncope, light-headedness and headaches.  Hematological: Does not bruise/bleed easily.  Psychiatric/Behavioral: Negative for agitation, confusion, hallucinations and sleep disturbance. The patient is not nervous/anxious.     Immunization History  Administered Date(s) Administered  . Influenza Whole 09/23/2010, 09/23/2012  . Influenza,inj,Quad PF,36+ Mos 10/09/2014  . Influenza-Unspecified 09/10/2013, 09/30/2015, 10/05/2016  . PPD Test 05/23/2015   . Pneumococcal Polysaccharide-23 03/31/2009  . Td 03/31/2009   Pertinent  Health Maintenance Due  Topic Date Due  . OPHTHALMOLOGY EXAM  06/26/1933  . PNA vac Low Risk Adult (2 of 2 - PCV13) 03/31/2010  . FOOT EXAM  03/16/2016  . INFLUENZA VACCINE  07/24/2017  . HEMOGLOBIN A1C  09/28/2017  . URINE MICROALBUMIN  04/30/2018   Fall Risk  09/26/2015 05/26/2015 04/29/2015 04/04/2015 03/17/2015  Falls in the past year? Yes Yes Yes Yes Yes  Number falls in past yr: 1 1 1 1 2  or more  Injury with Fall? No Yes Yes Yes Yes  Risk Factor Category  - High Fall Risk - High Fall Risk High Fall Risk  Risk for fall due to : Impaired balance/gait History of fall(s);Impaired balance/gait;Impaired mobility - History of fall(s);Impaired balance/gait;Impaired mobility History of fall(s);Impaired balance/gait;Impaired mobility  Follow up Falls evaluation completed Falls evaluation completed - Falls evaluation completed Falls evaluation completed    Vitals:   06/03/17 1050  BP: 134/72  Pulse: 76  Resp: 18  Temp: 98 F (36.7 C)  SpO2: 95%  Weight: 157 lb (71.2 kg)  Height: 5' 9"  (1.753 m)   Body mass index is 23.18 kg/m. Physical Exam  Constitutional: He is oriented to person, place, and time. He appears well-developed and well-nourished. No distress.  HENT:  Head: Normocephalic.  Mouth/Throat: Oropharynx is clear and moist. No oropharyngeal exudate.  Eyes: Conjunctivae and EOM are normal. Pupils are equal, round, and reactive to light. Right eye exhibits no discharge. Left eye exhibits no discharge. No scleral icterus.  Neck: Normal range of motion. No JVD present. No thyromegaly present.  Cardiovascular: Intact distal pulses.  Exam reveals no gallop and no friction rub.   Murmur heard. Pace maker   Pulmonary/Chest: Effort normal and breath sounds normal. No respiratory distress. He has no wheezes. He has no rales.  Abdominal: Soft. Bowel sounds are normal. He exhibits no distension. There is no  tenderness. There is no rebound and no guarding.  Musculoskeletal: He exhibits no tenderness or deformity.   Bilateral lower extremities trace edema.Unsteady gait uses FWW   Lymphadenopathy:    He has no cervical adenopathy.  Neurological: He is oriented to person, place, and time.  Skin: Skin is warm and dry. No erythema. No pallor.   Left flank area side pale grayish color  irregular shaped skin lesion with rough surface about 2.5 cm in size. Surrounding skin without any signs of infections or bleeding.   Psychiatric: He has  a normal mood and affect.   Labs reviewed:  Recent Labs  11/01/16 03/29/17 04/11/17  NA 139 137 134*  K 4.4 4.0 4.4  BUN 48* 48* 57*  CREATININE 1.2 1.1 1.3    Recent Labs  11/01/16 03/29/17  AST 22 16  ALT 14 8*  ALKPHOS 106 112    Recent Labs  11/01/16 03/29/17  WBC 5.6 6.4  HGB 14.4 13.8  HCT 43 41  PLT 124* 132*   Lab Results  Component Value Date   TSH 4.71 03/29/2017   Lab Results  Component Value Date   HGBA1C 6.7 03/29/2017   Lab Results  Component Value Date   CHOL 132 02/07/2015   HDL 33 (A) 02/07/2015   LDLCALC 76 02/07/2015   LDLDIRECT 85.6 07/19/2010   TRIG 115 02/07/2015   CHOLHDL 4 03/06/2011   Assessment/Plan   Actinic Keratosis  Left flank area side pale grayish color  irregular shaped skin lesion with rough surface about 2.5 cm in size. Surrounding skin without any signs of infections or bleeding. Refer to dermatology for evaluation.   Family/ staff Communication:  Reviewed plan of care with patient and facility Nurse.   Labs/tests ordered: None   Dinah C Ngetich, NP

## 2017-06-05 ENCOUNTER — Encounter: Payer: Self-pay | Admitting: Internal Medicine

## 2017-06-05 ENCOUNTER — Non-Acute Institutional Stay (SKILLED_NURSING_FACILITY): Payer: Medicare Other | Admitting: Internal Medicine

## 2017-06-05 DIAGNOSIS — F0391 Unspecified dementia with behavioral disturbance: Secondary | ICD-10-CM | POA: Diagnosis not present

## 2017-06-05 DIAGNOSIS — S51002A Unspecified open wound of left elbow, initial encounter: Secondary | ICD-10-CM

## 2017-06-05 DIAGNOSIS — Z23 Encounter for immunization: Secondary | ICD-10-CM

## 2017-06-05 DIAGNOSIS — E1121 Type 2 diabetes mellitus with diabetic nephropathy: Secondary | ICD-10-CM

## 2017-06-05 NOTE — Progress Notes (Signed)
Location:  [facility] Room Number: 9 Place of Service:  SNF ([phone]) Provider:   Blanchie Serve, MD  Patient Care Team: Blanchie Serve, MD as PCP - General (Internal Medicine) Irine Seal, MD as Consulting Physician (Urology) Verl Blalock, Marijo Conception, MD (Inactive) as Consulting Physician (Cardiology) [place] Crenshaw, Denice Bors, MD as Consulting Physician (Cardiology) Rigoberto Noel, MD as Consulting Physician (Pulmonary Disease) Ngetich, Nelda Bucks, NP as Nurse Practitioner (Family Medicine)  Extended Emergency Contact Information Primary Emergency Contact: Kathline Magic, Rosezella Rumpf of [place] Phone: [phone] Relation: Daughter Secondary Emergency Contact: Johnnette Gourd States of [place] Phone: [phone] Mobile Phone: [phone] Relation: Son  Code Status:  DNR  Goals of care: Advanced Directive information Advanced Directives 06/05/2017  Does Patient Have a Medical Advance Directive? Yes  Type of Paramedic of [place];Living will;Out of facility DNR (pink MOST or yellow form)  Does patient want to make changes to medical advance directive? -  Copy of [place] in Chart? Yes  Pre-existing out of facility DNR order (yellow form or pink MOST form) Yellow form placed in chart (order not valid for inpatient use)     Chief Complaint  Patient presents with  . Acute Visit    Medication management; also needs eye exam and Prevnar    HPI:  Pt is a 83 y.o. male seen today for an acute visit for medication management. He has been on risperidone 0.25 mg every other day since April 2018. Mood has remained stable. He has dementia but no acute behavior change has been reported. He complaints of itching to his left elbow area. On chart review, he is due for eye exam with his diabetes and prevnar immunization.    Past Medical History:  Diagnosis Date  . ANEMIA-IRON  DEFICIENCY 05/27/2007  . ANXIETY 11/14/2007  . BENIGN PROSTATIC HYPERTROPHY 11/14/2007  . CAROTID ARTERY DISEASE 10/02/2010  . Chronic combined systolic and diastolic CHF (congestive heart failure) ([place]) 10/10/2014  . 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 COLONIC POLYPS, HX OF 05/27/2007  . CORONARY ARTERY DISEASE 11/14/2007   prior bypass [phone]; Inferior MI 11/07; Severe 3 vessel disease; occluded SVG to RCA; SVG to OM subtotalled, SVG to diagonal with ostial 70 and 80 mid; EF 45. PCI of SVG to OM. FU myovue showed EF 56 with inferior infarct and very mild peri-infarct ischemia  . DEPRESSION 11/14/2007  . DIABETES MELLITUS, TYPE II 11/14/2007  . DIVERTICULITIS, HX OF 05/27/2007  . DM (diabetes mellitus), type 2 with renal complications ([place]) 7/42/5956  . DVT (deep venous thrombosis) ([place]) 03/21/2011  . Dyspnea 11/09/2014  . Effusion of left knee 03/17/2015  . Failure to thrive in adult 05/03/2015  . FOOT PAIN, BILATERAL 12/08/2010  . Generalized weakness 05/20/2015  . GERD 11/14/2007  . Hearing loss 05/13/2009   Qualifier: Diagnosis of  By: Jenny Reichmann MD, Hunt Oris   . Hyperlipemia 11/14/2007   Qualifier: Diagnosis of  By: Jenny Reichmann MD, Hunt Oris    . HYPERLIPIDEMIA 11/14/2007  . HYPERTENSION 11/14/2007  . HYPOTENSION, ORTHOSTATIC 03/06/2011  . Intermittent high-grade heart block 09/05/2009   s/p pacemaker  . Ischemic cardiomyopathy    a. echo (10/15):  mod focal basal septal hypertrophy, EF 40-45%, inf AK, Gr 1 DD, Al sclerosis, mild AI, MAC, mild MR, mod LAE, lipomatous hypertrophy, small effusion (no hemodynamic compromise)   .  Memory loss 06/01/2008  . Myalgia and myositis, unspecified 04/24/2011  . Osteoarthrosis, unspecified whether generalized or localized, unspecified site   . OSTEOPOROSIS 06/02/2010  . Other malaise and fatigue 04/24/2011  . Pacemaker-St. Jude 02/02/2008   St. Jude  . Peripheral neuropathy 03/27/2011  . PERIPHERAL VASCULAR DISEASE 11/14/2007  . Prostate  cancer ([place])   . Protein-calorie malnutrition, severe ([place]) 05/23/2015  . RENAL INSUFFICIENCY 11/14/2007  . Seborrheic keratosis 06/05/2011  . SECONDARY DM W/PERIPHERAL CIRC D/O UNCONTROLLED 11/07/2010  . Severe back pain   . SYNCOPE 09/08/2008  . Unspecified hearing loss 05/13/2009  . Vertebral compression fracture ([place]) 09/05/2009   Qualifier: Diagnosis of  By: Jenny Reichmann MD, Hunt Oris   . [name] 04/04/2015  . Weight loss 10/10/2014   Past Surgical History:  Procedure Laterality Date  . APPENDECTOMY    . CHOLECYSTECTOMY    . CORONARY ARTERY BYPASS GRAFT  2007  . CYSTOSCOPY    . PACEMAKER INSERTION  2012   Caryl Comes, MD  . prostate needle biopsy    . s/p bilat knee replacement  1992   Paul, MD  . s/p coronary stent x 1      Allergies  Allergen Reactions  . Hydrocodone     hallucination  . Tramadol     hallucination    Outpatient Encounter Prescriptions as of 06/05/2017  Medication Sig  . Amino Acids-Protein Hydrolys (FEEDING SUPPLEMENT, PRO-STAT SUGAR FREE 64,) LIQD Take 30 mLs by mouth 3 (three) times daily with meals.   . carbamide peroxide (DEBROX) 6.5 % otic solution 5 drops. Instil 5 - 10 drops in each ear at bedtime 3 times a week every other month  . docusate sodium (COLACE) 100 MG capsule Take 100 mg by mouth 2 (two) times daily.  . famotidine (PEPCID) 20 MG tablet Take 20 mg by mouth daily as needed for heartburn. For GERD   . fentaNYL (DURAGESIC) 25 MCG/HR patch Apply fresh patch every 3 days to help pain. Remove old patch.  . furosemide (LASIX) 20 MG tablet Take 20 mg by mouth daily.   Marland Kitchen levothyroxine (SYNTHROID, LEVOTHROID) 25 MCG tablet Take 25 mcg by mouth daily.  . metFORMIN (GLUCOPHAGE) 500 MG tablet Take 500 mg by mouth. Take one twice a day  . nitroGLYCERIN (NITROSTAT) 0.4 MG SL tablet Place 0.4 mg under the tongue every 5 (five) minutes as needed for chest pain.  . Nutritional Supplements (BOOST DIABETIC) LIQD Take 237 mLs by mouth 2 (two) times daily. May also have one  at [facility] as needed  . OXYGEN Inhale 1 L/min into the lungs as needed.   Marland Kitchen Propylene Glycol (SYSTANE BALANCE) 0.6 % SOLN Apply to eye. One drop both eyes 2 times a day due to dry eyes  . risperiDONE (RISPERDAL) 0.25 MG tablet Take 0.25 mg by mouth every other day. At 8PM  . spironolactone (ALDACTONE) 25 MG tablet Take 25 mg by mouth daily.   Marland Kitchen zinc oxide 20 % ointment Apply 1 application topically. Apply ointment twice daily as needed to buttocks , apply to boney prominences mid spine three times daily as needed   No facility-administered encounter medications on file as of 06/05/2017.     Review of Systems  Constitutional: Negative for diaphoresis, fatigue and fever.  Eyes: Negative for pain.  Respiratory: Negative for cough and shortness of breath.   Cardiovascular: Negative for chest pain.  Gastrointestinal: Negative for abdominal pain.  Musculoskeletal:       He is out of bed daily  and gets around with his walker.   Skin:       Has easy bruising  Psychiatric/Behavioral: Positive for confusion. Negative for behavioral problems.       Mood has been overall stable on target behavior review over last 2 weeks.     Immunization History  Administered Date(s) Administered  . Influenza Whole 09/23/2010, 09/23/2012  . Influenza,inj,Quad PF,36+ Mos 10/09/2014  . Influenza-Unspecified 09/10/2013, 09/30/2015, 10/05/2016  . PPD Test 05/23/2015  . Pneumococcal Polysaccharide-23 03/31/2009  . Td 03/31/2009   Pertinent  Health Maintenance Due  Topic Date Due  . OPHTHALMOLOGY EXAM  06/26/1933  . PNA vac Low Risk Adult (2 of 2 - PCV13) 03/31/2010  . INFLUENZA VACCINE  07/24/2017  . HEMOGLOBIN A1C  09/28/2017  . FOOT EXAM  12/28/2017  . URINE MICROALBUMIN  04/30/2018   Fall Risk  09/26/2015 05/26/2015 04/29/2015 04/04/2015 03/17/2015  Falls in the past year? Yes Yes Yes Yes Yes  Number falls in past yr: 1 1 1 1 2  or more  Injury with Fall? No Yes Yes Yes Yes  Risk Factor Category  - High Fall Risk  - High Fall Risk High Fall Risk  Risk for fall due to : Impaired balance/gait History of fall(s);Impaired balance/gait;Impaired mobility - History of fall(s);Impaired balance/gait;Impaired mobility History of fall(s);Impaired balance/gait;Impaired mobility  Follow up Falls evaluation completed Falls evaluation completed - Falls evaluation completed Falls evaluation completed   Functional Status Survey:    Vitals:   06/05/17 1027  BP: 111/71  Pulse: 73  Resp: (!) 21  Temp: (!) 96.9 F (36.1 C)  SpO2: 90%  Weight: 161 lb 1.6 oz (73.1 kg)  Height: 5' 9"  (1.753 m)   Body mass index is 23.79 kg/m. Physical Exam  Constitutional: He appears well-developed and well-nourished. No distress.  HENT:  Head: Normocephalic and atraumatic.  Eyes: Pupils are equal, round, and reactive to light.  Neck: Normal range of motion. Neck supple.  Cardiovascular: Normal rate and regular rhythm.   Pulmonary/Chest: Effort normal and breath sounds normal.  Pacemaker to left chest wall.  Abdominal: Soft. Bowel sounds are normal.  Musculoskeletal: He exhibits no edema.  Neurological: He is alert.  Oriented to person and place only  Skin: Skin is warm and dry. He is not diaphoretic. There is erythema.  Denuded skin area to left elbow with mild erythema and dry skin around it, easy bruising to his arms  Psychiatric: He has a normal mood and affect.    Labs reviewed:  Recent Labs  11/01/16 03/29/17 04/11/17  NA 139 137 134*  K 4.4 4.0 4.4  BUN 48* 48* 57*  CREATININE 1.2 1.1 1.3    Recent Labs  11/01/16 03/29/17  AST 22 16  ALT 14 8*  ALKPHOS 106 112    Recent Labs  11/01/16 03/29/17  WBC 5.6 6.4  HGB 14.4 13.8  HCT 43 41  PLT 124* 132*   Lab Results  Component Value Date   TSH 4.71 03/29/2017   Lab Results  Component Value Date   HGBA1C 6.7 03/29/2017   Lab Results  Component Value Date   CHOL 132 02/07/2015   HDL 33 (A) 02/07/2015   LDLCALC 76 02/07/2015   LDLDIRECT 85.6  07/19/2010   TRIG 115 02/07/2015   CHOLHDL 4 03/06/2011    Significant Diagnostic Results in last 30 days:  No results found.  Assessment/Plan  Dementia with behavioral disturbance Behavior overall stable per nursing. Currently on risperidone 0.25 mg qod. Discontinue this  in attempt of GDR and monitor his symptom  Left elbow wound Likely from picking on a scab. No signs of infection. Apply triamcinolone cream every other day to affected area for 1 week and apply telfa dressing until healed to prevent picking on his skin. Monitor for signs of infection  Pneumococcal vaccination He is due for prevnar, will write order for facility to provide  Dm type 2 Lab Results  Component Value Date   HGBA1C 6.7 03/29/2017   Continue his metformin, no changes made, due for annual retinal exam to rule out macular degeneration   Family/ staff Communication: reviewed care plan with patient and charge nurse  Labs/tests ordered:  Eye exam, prevnar vaccine  Blanchie Serve, MD Internal Medicine [address] [place], [place] [address] Cell Phone (Monday-Friday 8 am - 5 pm): [phone] On Call: [phone] and follow prompts after 5 pm and on weekends Office Phone: [phone] Office Fax: [phone]

## 2017-06-07 ENCOUNTER — Encounter: Payer: Self-pay | Admitting: Internal Medicine

## 2017-06-07 ENCOUNTER — Non-Acute Institutional Stay (SKILLED_NURSING_FACILITY): Payer: Medicare Other | Admitting: Internal Medicine

## 2017-06-07 DIAGNOSIS — H6120 Impacted cerumen, unspecified ear: Secondary | ICD-10-CM

## 2017-06-07 NOTE — Progress Notes (Signed)
Location:  [facility] Room Number: 9 Place of Service:  SNF [phone]) Provider:   Blanchie Serve, MD  Patient Care Team: Blanchie Serve, MD as PCP - General (Internal Medicine) Irine Seal, MD as Consulting Physician (Urology) Verl Blalock, Marijo Conception, MD (Inactive) as Consulting Physician (Cardiology) [place] Crenshaw, Denice Bors, MD as Consulting Physician (Cardiology) Rigoberto Noel, MD as Consulting Physician (Pulmonary Disease) Ngetich, Nelda Bucks, NP as Nurse Practitioner (Family Medicine)  Extended Emergency Contact Information Primary Emergency Contact: Kathline Magic, Rosezella Rumpf of [place] Phone: [phone] Relation: Daughter Secondary Emergency Contact: Johnnette Gourd States of [place] Phone: [phone] Mobile Phone: [phone] Relation: Son  Code Status:  DNR  Goals of care: Advanced Directive information Advanced Directives 06/07/2017  Does Patient Have a Medical Advance Directive? Yes  Type of Advance Directive Out of facility DNR (pink MOST or yellow form);[place];Living will  Does patient want to make changes to medical advance directive? -  Copy of [place] in Chart? Yes  Pre-existing out of facility DNR order (yellow form or pink MOST form) -     Chief Complaint  Patient presents with  . Acute Visit    check ears    HPI:  Pt is a 83 y.o. male seen today for an acute visit for family concern. Daughter is present at bedside and would like his ears checked. She mentions that he builds up wax easily and she would like this assessed. He is lying in bed. He denies any stuffiness in his ears. He denies ear pain or ear discharge. Denies tinnitus. No symptom of URI reported by pt and daughter. He has hearing aid.     Past Medical History:  Diagnosis Date  . ANEMIA-IRON DEFICIENCY 05/27/2007  . ANXIETY 11/14/2007  . BENIGN PROSTATIC HYPERTROPHY 11/14/2007  .  CAROTID ARTERY DISEASE 10/02/2010  . Chronic combined systolic and diastolic CHF (congestive heart failure) ([place]) 10/10/2014  . CKD stage 3 due to type 1 diabetes mellitus ([place]) 03/17/2015  . COLONIC POLYPS, HX OF 05/27/2007  . CORONARY ARTERY DISEASE 11/14/2007   prior bypass [phone]; Inferior MI 11/07; Severe 3 vessel disease; occluded SVG to RCA; SVG to OM subtotalled, SVG to diagonal with ostial 70 and 80 mid; EF 45. PCI of SVG to OM. FU myovue showed EF 56 with inferior infarct and very mild peri-infarct ischemia  . DEPRESSION 11/14/2007  . DIABETES MELLITUS, TYPE II 11/14/2007  . DIVERTICULITIS, HX OF 05/27/2007  . DM (diabetes mellitus), type 2 with renal complications ([place]) 8/41/6606  . DVT (deep venous thrombosis) ([place]) 03/21/2011  . Dyspnea 11/09/2014  . Effusion of left knee 03/17/2015  . Failure to thrive in adult 05/03/2015  . FOOT PAIN, BILATERAL 12/08/2010  . Generalized weakness 05/20/2015  . GERD 11/14/2007  . Hearing loss 05/13/2009   Qualifier: Diagnosis of  By: Jenny Reichmann MD, Hunt Oris   . Hyperlipemia 11/14/2007   Qualifier: Diagnosis of  By: Jenny Reichmann MD, Hunt Oris    . HYPERLIPIDEMIA 11/14/2007  . HYPERTENSION 11/14/2007  . HYPOTENSION, ORTHOSTATIC 03/06/2011  . Intermittent high-grade heart block 09/05/2009   s/p pacemaker  . Ischemic cardiomyopathy    a. echo (10/15):  mod focal basal septal hypertrophy, EF 40-45%, inf AK, Gr 1 DD, Al sclerosis, mild AI, MAC, mild MR, mod LAE, lipomatous hypertrophy, small effusion (no hemodynamic compromise)   . Memory loss 06/01/2008  . Myalgia  and myositis, unspecified 04/24/2011  . Osteoarthrosis, unspecified whether generalized or localized, unspecified site   . OSTEOPOROSIS 06/02/2010  . Other malaise and fatigue 04/24/2011  . Pacemaker-St. Jude 02/02/2008   St. Jude  . Peripheral neuropathy 03/27/2011  . PERIPHERAL VASCULAR DISEASE 11/14/2007  . Prostate cancer ([place])   . Protein-calorie malnutrition, severe ([place]) 05/23/2015  . RENAL INSUFFICIENCY  11/14/2007  . Seborrheic keratosis 06/05/2011  . SECONDARY DM W/PERIPHERAL CIRC D/O UNCONTROLLED 11/07/2010  . Severe back pain   . SYNCOPE 09/08/2008  . Unspecified hearing loss 05/13/2009  . Vertebral compression fracture ([place]) 09/05/2009   Qualifier: Diagnosis of  By: Jenny Reichmann MD, Hunt Oris   . [name] 04/04/2015  . Weight loss 10/10/2014   Past Surgical History:  Procedure Laterality Date  . APPENDECTOMY    . CHOLECYSTECTOMY    . CORONARY ARTERY BYPASS GRAFT  2007  . CYSTOSCOPY    . PACEMAKER INSERTION  2012   Caryl Comes, MD  . prostate needle biopsy    . s/p bilat knee replacement  1992   Paul, MD  . s/p coronary stent x 1      Allergies  Allergen Reactions  . Hydrocodone     hallucination  . Tramadol     hallucination    Outpatient Encounter Prescriptions as of 06/07/2017  Medication Sig  . Amino Acids-Protein Hydrolys (FEEDING SUPPLEMENT, PRO-STAT SUGAR FREE 64,) LIQD Take 30 mLs by mouth 3 (three) times daily with meals.   . carbamide peroxide (DEBROX) 6.5 % otic solution 5 drops. Instil 5 - 10 drops in each ear at bedtime 3 times a week every other month  . docusate sodium (COLACE) 100 MG capsule Take 100 mg by mouth 2 (two) times daily.  . famotidine (PEPCID) 20 MG tablet Take 20 mg by mouth daily as needed for heartburn. For GERD   . fentaNYL (DURAGESIC) 25 MCG/HR patch Apply fresh patch every 3 days to help pain. Remove old patch.  . furosemide (LASIX) 20 MG tablet Take 20 mg by mouth daily.   Marland Kitchen levothyroxine (SYNTHROID, LEVOTHROID) 25 MCG tablet Take 25 mcg by mouth daily.  . metFORMIN (GLUCOPHAGE) 500 MG tablet Take 500 mg by mouth. Take one twice a day  . nitroGLYCERIN (NITROSTAT) 0.4 MG SL tablet Place 0.4 mg under the tongue every 5 (five) minutes as needed for chest pain.  . Nutritional Supplements (BOOST DIABETIC) LIQD Take 237 mLs by mouth 2 (two) times daily. May also have one at [facility] as needed  . OXYGEN Inhale 1 L/min into the lungs as needed.   Marland Kitchen Propylene Glycol  (SYSTANE BALANCE) 0.6 % SOLN Apply to eye. One drop both eyes 2 times a day due to dry eyes  . spironolactone (ALDACTONE) 25 MG tablet Take 25 mg by mouth daily.   Marland Kitchen zinc oxide 20 % ointment Apply 1 application topically. Apply ointment twice daily as needed to buttocks , apply to boney prominences mid spine three times daily as needed  . [DISCONTINUED] risperiDONE (RISPERDAL) 0.25 MG tablet Take 0.25 mg by mouth every other day. At 8PM   No facility-administered encounter medications on file as of 06/07/2017.     Review of Systems  Constitutional: Negative for diaphoresis, fatigue and fever.  HENT: Positive for hearing loss. Negative for congestion, ear discharge and ear pain.   Respiratory: Negative for cough and shortness of breath.   Cardiovascular: Negative for chest pain.  Gastrointestinal: Negative for abdominal pain.  Musculoskeletal:       He  is out of bed daily and gets around with his walker.   Skin:       Has easy bruising  Psychiatric/Behavioral: Positive for confusion. Negative for behavioral problems.       Mood has been overall stable on target behavior review over last 2 weeks.     Immunization History  Administered Date(s) Administered  . Influenza Whole 09/23/2010, 09/23/2012  . Influenza,inj,Quad PF,36+ Mos 10/09/2014  . Influenza-Unspecified 09/10/2013, 09/30/2015, 10/05/2016  . PPD Test 05/23/2015  . Pneumococcal Conjugate-13 04/27/2017  . Pneumococcal Polysaccharide-23 03/31/2009  . Td 03/31/2009   Pertinent  Health Maintenance Due  Topic Date Due  . OPHTHALMOLOGY EXAM  06/26/1933  . INFLUENZA VACCINE  07/24/2017  . HEMOGLOBIN A1C  09/28/2017  . FOOT EXAM  12/28/2017  . URINE MICROALBUMIN  04/30/2018  . PNA vac Low Risk Adult  Completed   Fall Risk  09/26/2015 05/26/2015 04/29/2015 04/04/2015 03/17/2015  Falls in the past year? _0   Number falls in past yr: _1 or more  Injury with Fall? No Yes Yes Yes Yes  Risk Factor Category  - High  Fall Risk - High Fall Risk High Fall Risk  Risk for fall due to : Impaired balance/gait History of fall(s);Impaired balance/gait;Impaired mobility - History of fall(s);Impaired balance/gait;Impaired mobility History of fall(s);Impaired balance/gait;Impaired mobility  Follow up Falls evaluation completed Falls evaluation completed - Falls evaluation completed Falls evaluation completed   Functional Status Survey:    Vitals:   06/07/17 1116  BP: 111/71  Pulse: 73  Resp: 16  Temp: (!) 96.2 F (35.7 C)  SpO2: 94%  Weight: 156 lb (70.8 kg)  Height: _2  (1.753 m)   Body mass index is 23.04 kg/m. Physical Exam  Constitutional: He appears well-developed and well-nourished. No distress.  HENT:  Head: Normocephalic and atraumatic.  Right Ear: External ear normal.  Left Ear: External ear normal.  Small amount of cerumen to ear canal of both ear, no cerumen impaction noted, able to visualize tympanic membrane  Eyes: Pupils are equal, round, and reactive to light.  Neck: Normal range of motion. Neck supple.  Cardiovascular: Normal rate and regular rhythm.   Pulmonary/Chest: Effort normal and breath sounds normal.  Pacemaker to left chest wall.  Neurological: He is alert.  Oriented to person and place only  Skin: He is not diaphoretic.  Psychiatric: He has a normal mood and affect.    Labs reviewed:  Recent Labs  11/01/16 03/29/17 04/11/17  NA 139 137 134*  K 4.4 4.0 4.4  BUN 48* 48* 57*  CREATININE 1.2 1.1 1.3    Recent Labs  11/01/16 03/29/17  AST 22 16  ALT 14 8*  ALKPHOS 106 112    Recent Labs  11/01/16 03/29/17  WBC 5.6 6.4  HGB 14.4 13.8  HCT 43 41  PLT 124* 132*   Lab Results  Component Value Date   TSH 4.71 03/29/2017   Lab Results  Component Value Date   HGBA1C 6.7 03/29/2017   Lab Results  Component Value Date   CHOL 132 02/07/2015   HDL 33 (A) 02/07/2015   LDLCALC 76 02/07/2015   LDLDIRECT 85.6 07/19/2010   TRIG 115 02/07/2015   CHOLHDL 4  03/06/2011    Significant Diagnostic Results in last 30 days:  No results found.  Assessment/Plan  Cerumen to ears Minimal, monitor clinically, if has cerumen impaction, will try ear drops to soften the cerumen followed by ear lavage. reassured  patient and daughter. Answered her questions. She agrees with care plan.   Family/ staff Communication: reviewed care plan with patient, his daughter and charge nurse  Labs/tests ordered:  none  Blanchie Serve, MD Internal Medicine [place], [place] [address] Cell Phone (Monday-Friday 8 am - 5 pm): [phone] On Call: [phone] and follow prompts after 5 pm and on weekends Office Phone: [phone] Office Fax: [phone]

## 2017-06-28 ENCOUNTER — Non-Acute Institutional Stay (SKILLED_NURSING_FACILITY): Payer: Medicare Other | Admitting: Family

## 2017-06-28 ENCOUNTER — Encounter: Payer: Self-pay | Admitting: Family

## 2017-06-28 DIAGNOSIS — H00011 Hordeolum externum right upper eyelid: Secondary | ICD-10-CM | POA: Diagnosis not present

## 2017-06-30 NOTE — Progress Notes (Signed)
Location:  [facility] Room Number: 9 Place of Service:  SNF [phone]) Provider: Rachael Ferrie FNP-C  Blanchie Serve, MD  Patient Care Team: Blanchie Serve, MD as PCP - General (Internal Medicine) Irine Seal, MD as Consulting Physician (Urology) Verl Blalock, Marijo Conception, MD (Inactive) as Consulting Physician (Cardiology) [facility] Crenshaw, Denice Bors, MD as Consulting Physician (Cardiology) Rigoberto Noel, MD as Consulting Physician (Pulmonary Disease) Jerric Oyen, Nelda Bucks, NP as Nurse Practitioner (Family Medicine)  Extended Emergency Contact Information Primary Emergency Contact: Kathline Magic, [place] Phone: [phone] Relation: Daughter Secondary Emergency Contact: Johnnette Gourd [place] of [place] Phone: [phone] Mobile Phone: [phone] Relation: Son  Code Status:  DNR Goals of care: Advanced Directive information Advanced Directives 06/28/2017  Does Patient Have a Medical Advance Directive? Yes  Type of Advance Directive Out of facility DNR (pink MOST or yellow form);[place];Living will  Does patient want to make changes to medical advance directive? -  Copy of [place] in Chart? Yes  Pre-existing out of facility DNR order (yellow form or pink MOST form) Yellow form placed in chart (order not valid for inpatient use);Pink MOST form placed in chart (order not valid for inpatient use)     Chief Complaint  Patient presents with  . Acute Visit    right eyelid is swollen    HPI:  Pt is a 83 y.o. male seen today at [facility] for an acute visit for evaluation of right eyelid swelling. He is seen in his room today with sitter at the bedside. Facility Nurse reports patient's eye lid swollen. He denies any fever, chills, drainage, pain or injury to eye.He states tender to touch.    Past Medical History:  Diagnosis Date  . ANEMIA-IRON DEFICIENCY  05/27/2007  . ANXIETY 11/14/2007  . BENIGN PROSTATIC HYPERTROPHY 11/14/2007  . CAROTID ARTERY DISEASE 10/02/2010  . Chronic combined systolic and diastolic CHF (congestive heart failure) ([place]) 10/10/2014  . CKD stage 3 due to type 1 diabetes mellitus ([place]) 03/17/2015  . COLONIC POLYPS, HX OF 05/27/2007  . CORONARY ARTERY DISEASE 11/14/2007   prior bypass [phone]; Inferior MI 11/07; Severe 3 vessel disease; occluded SVG to RCA; SVG to OM subtotalled, SVG to diagonal with ostial 70 and 80 mid; EF 45. PCI of SVG to OM. FU myovue showed EF 56 with inferior infarct and very mild peri-infarct ischemia  . DEPRESSION 11/14/2007  . DIABETES MELLITUS, TYPE II 11/14/2007  . DIVERTICULITIS, HX OF 05/27/2007  . DM (diabetes mellitus), type 2 with renal complications ([place]) 01/20/7866  . DVT (deep venous thrombosis) ([place]) 03/21/2011  . Dyspnea 11/09/2014  . Effusion of left knee 03/17/2015  . Failure to thrive in adult 05/03/2015  . FOOT PAIN, BILATERAL 12/08/2010  . Generalized weakness 05/20/2015  . GERD 11/14/2007  . Hearing loss 05/13/2009   Qualifier: Diagnosis of  By: Jenny Reichmann MD, Hunt Oris   . Hyperlipemia 11/14/2007   Qualifier: Diagnosis of  By: Jenny Reichmann MD, Hunt Oris    . HYPERLIPIDEMIA 11/14/2007  . HYPERTENSION 11/14/2007  . HYPOTENSION, ORTHOSTATIC 03/06/2011  . Intermittent high-grade heart block 09/05/2009   s/p pacemaker  . Ischemic cardiomyopathy    a. echo (10/15):  mod focal basal septal hypertrophy, EF 40-45%, inf AK, Gr 1 DD, Al sclerosis, mild AI, MAC, mild MR, mod LAE, lipomatous hypertrophy, small effusion (no hemodynamic compromise)   . Memory  loss 06/01/2008  . Myalgia and myositis, unspecified 04/24/2011  . Osteoarthrosis, unspecified whether generalized or localized, unspecified site   . OSTEOPOROSIS 06/02/2010  . Other malaise and fatigue 04/24/2011  . Pacemaker-St. Jude 02/02/2008   St. Jude  . Peripheral neuropathy 03/27/2011  . PERIPHERAL VASCULAR DISEASE 11/14/2007  . Prostate cancer ([place])     . Protein-calorie malnutrition, severe ([place]) 05/23/2015  . RENAL INSUFFICIENCY 11/14/2007  . Seborrheic keratosis 06/05/2011  . SECONDARY DM W/PERIPHERAL CIRC D/O UNCONTROLLED 11/07/2010  . Severe back pain   . SYNCOPE 09/08/2008  . Unspecified hearing loss 05/13/2009  . Vertebral compression fracture ([place]) 09/05/2009   Qualifier: Diagnosis of  By: Jenny Reichmann MD, Hunt Oris   . [name] 04/04/2015  . Weight loss 10/10/2014   Past Surgical History:  Procedure Laterality Date  . APPENDECTOMY    . CHOLECYSTECTOMY    . CORONARY ARTERY BYPASS GRAFT  2007  . CYSTOSCOPY    . PACEMAKER INSERTION  2012   Caryl Comes, MD  . prostate needle biopsy    . s/p bilat knee replacement  1992   Paul, MD  . s/p coronary stent x 1      Allergies  Allergen Reactions  . Hydrocodone     hallucination  . Tramadol     hallucination    Allergies as of 06/28/2017      Reactions   Hydrocodone    hallucination   Tramadol    hallucination      Medication List       Accurate as of 06/28/17 11:59 PM. Always use your most recent med list.          BOOST DIABETIC Liqd Take 237 mLs by mouth 2 (two) times daily. May also have one at [facility] as needed   carbamide peroxide 6.5 % OTIC solution Commonly known as:  DEBROX 5 drops. Instil 5 - 10 drops in each ear at bedtime 3 times a week every other month   docusate sodium 100 MG capsule Commonly known as:  COLACE Take 100 mg by mouth 2 (two) times daily.   famotidine 20 MG tablet Commonly known as:  PEPCID Take 20 mg by mouth daily as needed for heartburn. For GERD   feeding supplement (PRO-STAT SUGAR FREE 64) Liqd Take 30 mLs by mouth 3 (three) times daily with meals.   fentaNYL 25 MCG/HR patch Commonly known as:  DURAGESIC Apply fresh patch every 3 days to help pain. Remove old patch.   furosemide 20 MG tablet Commonly known as:  LASIX Take 20 mg by mouth daily.   levothyroxine 25 MCG tablet Commonly known as:  SYNTHROID, LEVOTHROID Take 25 mcg by mouth  daily.   metFORMIN 500 MG tablet Commonly known as:  GLUCOPHAGE Take 500 mg by mouth. Take one twice a day   nitroGLYCERIN 0.4 MG SL tablet Commonly known as:  NITROSTAT Place 0.4 mg under the tongue every 5 (five) minutes as needed for chest pain.   OXYGEN Inhale 1 L/min into the lungs as needed.   spironolactone 25 MG tablet Commonly known as:  ALDACTONE Take 25 mg by mouth daily.   SYSTANE BALANCE 0.6 % Soln Generic drug:  Propylene Glycol Apply to eye. One drop both eyes 2 times a day due to dry eyes   zinc oxide 20 % ointment Apply 1 application topically. Apply ointment twice daily as needed to buttocks , apply to boney prominences mid spine three times daily as needed       Review  of Systems  Constitutional: Negative for activity change, appetite change, chills, fatigue and fever.  HENT: Negative for congestion, rhinorrhea, sinus pain, sinus pressure, sneezing and sore throat.   Eyes: Negative for pain, discharge, redness, itching and visual disturbance.       Right upper eyelid swelling and tender to touch   Respiratory: Negative for cough, chest tightness, shortness of breath and wheezing.   Cardiovascular: Positive for leg swelling. Negative for chest pain and palpitations.  Gastrointestinal: Negative for abdominal distention, abdominal pain, constipation, diarrhea, nausea and vomiting.  Genitourinary: Negative for dysuria, flank pain, frequency and urgency.  Musculoskeletal: Positive for gait problem.  Skin: Negative for color change, pallor and rash.  Neurological: Negative for dizziness, seizures, syncope, light-headedness and headaches.  Psychiatric/Behavioral: Negative for agitation, confusion, hallucinations and sleep disturbance. The patient is not nervous/anxious.     Immunization History  Administered Date(s) Administered  . Influenza Whole 09/23/2010, 09/23/2012  . Influenza,inj,Quad PF,36+ Mos 10/09/2014  . Influenza-Unspecified 09/10/2013,  09/30/2015, 10/05/2016  . PPD Test 05/23/2015  . Pneumococcal Conjugate-13 04/27/2017  . Pneumococcal Polysaccharide-23 03/31/2009  . Td 03/31/2009   Pertinent  Health Maintenance Due  Topic Date Due  . OPHTHALMOLOGY EXAM  06/26/1933  . INFLUENZA VACCINE  07/24/2017  . HEMOGLOBIN A1C  09/28/2017  . FOOT EXAM  12/28/2017  . URINE MICROALBUMIN  04/30/2018  . PNA vac Low Risk Adult  Completed   Fall Risk  09/26/2015 05/26/2015 04/29/2015 04/04/2015 03/17/2015  Falls in the past year? Yes Yes Yes Yes Yes  Number falls in past yr: 1 1 1 1 2  or more  Injury with Fall? No Yes Yes Yes Yes  Risk Factor Category  - High Fall Risk - High Fall Risk High Fall Risk  Risk for fall due to : Impaired balance/gait History of fall(s);Impaired balance/gait;Impaired mobility - History of fall(s);Impaired balance/gait;Impaired mobility History of fall(s);Impaired balance/gait;Impaired mobility  Follow up Falls evaluation completed Falls evaluation completed - Falls evaluation completed Falls evaluation completed    Vitals:   06/28/17 1225  BP: 101/70  Pulse: 99  Resp: 18  Temp: (!) 96.5 F (35.8 C)  SpO2: 97%  Weight: 156 lb (70.8 kg)  Height: 5' 9"  (1.753 m)   Body mass index is 23.04 kg/m. Physical Exam  Constitutional: He is oriented to person, place, and time. He appears well-developed and well-nourished. No distress.  HENT:  Head: Normocephalic.  Mouth/Throat: Oropharynx is clear and moist. No oropharyngeal exudate.  Eyes: Conjunctivae and EOM are normal. Pupils are equal, round, and reactive to light. Right eye exhibits no discharge. Left eye exhibits no discharge. No scleral icterus.  Right upper eyelid red, tender to touch swollen nodule to mid eye lid.    Neck: Normal range of motion. No JVD present. No thyromegaly present.  Cardiovascular: Intact distal pulses.  Exam reveals no gallop and no friction rub.   Murmur heard. Pace maker   Pulmonary/Chest: Effort normal and breath sounds  normal. No respiratory distress. He has no wheezes. He has no rales.  Abdominal: Soft. Bowel sounds are normal. He exhibits no distension. There is no tenderness. There is no rebound and no guarding.  Musculoskeletal: He exhibits no tenderness or deformity.  Unsteady gait. Uses FWW   Lymphadenopathy:    He has no cervical adenopathy.  Neurological: He is oriented to person, place, and time.  Skin: Skin is warm and dry. No rash noted. No erythema. No pallor.  Psychiatric: He has a normal mood and affect.  Labs reviewed:  Recent Labs  11/01/16 03/29/17 04/11/17  NA 139 137 134*  K 4.4 4.0 4.4  BUN 48* 48* 57*  CREATININE 1.2 1.1 1.3    Recent Labs  11/01/16 03/29/17  AST 22 16  ALT 14 8*  ALKPHOS 106 112    Recent Labs  11/01/16 03/29/17  WBC 5.6 6.4  HGB 14.4 13.8  HCT 43 41  PLT 124* 132*   Lab Results  Component Value Date   TSH 4.71 03/29/2017   Lab Results  Component Value Date   HGBA1C 6.7 03/29/2017   Lab Results  Component Value Date   CHOL 132 02/07/2015   HDL 33 (A) 02/07/2015   LDLCALC 76 02/07/2015   LDLDIRECT 85.6 07/19/2010   TRIG 115 02/07/2015   CHOLHDL 4 03/06/2011    Significant Diagnostic Results in last 30 days:  No results found.  Assessment/Plan   Hordeolum externum of right upper eyelid Afebrile.Right upper eyelid red, tender to touch swollen nodule to mid eye lid.No drainage or visual impairment.Apply warm cloth compressor daily at least for ten minutes x 3 days. Start Erythromycin Opth 0.5 % apply 1 cm ribbon to right upper eye lid four times daily x 7 days.Notify provider if no relief or worsening.    Family/ staff Communication: Reviewed plan of care with patient and facility Nurse.  Labs/tests ordered: None   Tristyn Pharris C Danh Bayus, NP

## 2017-07-03 ENCOUNTER — Encounter: Payer: Self-pay | Admitting: Internal Medicine

## 2017-07-03 ENCOUNTER — Non-Acute Institutional Stay (SKILLED_NURSING_FACILITY): Payer: Medicare Other | Admitting: Internal Medicine

## 2017-07-03 DIAGNOSIS — H00011 Hordeolum externum right upper eyelid: Secondary | ICD-10-CM | POA: Diagnosis not present

## 2017-07-03 DIAGNOSIS — N183 Chronic kidney disease, stage 3 unspecified: Secondary | ICD-10-CM

## 2017-07-03 DIAGNOSIS — I2510 Atherosclerotic heart disease of native coronary artery without angina pectoris: Secondary | ICD-10-CM

## 2017-07-03 DIAGNOSIS — E1121 Type 2 diabetes mellitus with diabetic nephropathy: Secondary | ICD-10-CM

## 2017-07-03 NOTE — Progress Notes (Signed)
Location:  [facility] Room Number: 9 Place of Service:  SNF [phone]) Provider:  Blanchie Serve MD  Blanchie Serve, MD  Patient Care Team: Blanchie Serve, MD as PCP - General (Internal Medicine) Irine Seal, MD as Consulting Physician (Urology) Verl Blalock, Marijo Conception, MD (Inactive) as Consulting Physician (Cardiology) [place] Crenshaw, Denice Bors, MD as Consulting Physician (Cardiology) Rigoberto Noel, MD as Consulting Physician (Pulmonary Disease) [place], Nelda Bucks, NP as Nurse Practitioner (Family Medicine)  Extended Emergency Contact Information Primary Emergency Contact: Kathline Magic, [place] of [place] Phone: [phone] Relation: Daughter Secondary Emergency Contact: Johnnette Gourd [place] of [place] Phone: [phone] Mobile Phone: [phone] Relation: Son  Code Status:  DNR  Goals of care: Advanced Directive information Advanced Directives 06/28/2017  Does Patient Have a Medical Advance Directive? Yes  Type of Advance Directive Out of facility DNR (pink MOST or yellow form);[place];Living will  Does patient want to make changes to medical advance directive? -  Copy of [place] in Chart? Yes  Pre-existing out of facility DNR order (yellow form or pink MOST form) Yellow form placed in chart (order not valid for inpatient use);Pink MOST form placed in chart (order not valid for inpatient use)     Chief Complaint  Patient presents with  . Medical Management of Chronic Issues    Routine Visit     HPI:  Pt is a 83 y.o. male seen today for medical management of chronic diseases.  He is seen in his room. He denies any concern today. Per nursing, he refuses care at times. He has dementia. He is a high fall risk and has had fall with no apparent injury. He takes his medications and needs 1 person assistance with transfer. He gets around with a walker and needs wheelchair  for long distance. He is being treated for stye to his right eye at present.    Past Medical History:  Diagnosis Date  . ANEMIA-IRON DEFICIENCY 05/27/2007  . ANXIETY 11/14/2007  . BENIGN PROSTATIC HYPERTROPHY 11/14/2007  . CAROTID ARTERY DISEASE 10/02/2010  . Chronic combined systolic and diastolic CHF (congestive heart failure) ([place]) 10/10/2014  . CKD stage 3 due to type 1 diabetes mellitus ([place]) 03/17/2015  . COLONIC POLYPS, HX OF 05/27/2007  . CORONARY ARTERY DISEASE 11/14/2007   prior bypass [phone]; Inferior MI 11/07; Severe 3 vessel disease; occluded SVG to RCA; SVG to OM subtotalled, SVG to diagonal with ostial 70 and 80 mid; EF 45. PCI of SVG to OM. FU myovue showed EF 56 with inferior infarct and very mild peri-infarct ischemia  . DEPRESSION 11/14/2007  . DIABETES MELLITUS, TYPE II 11/14/2007  . DIVERTICULITIS, HX OF 05/27/2007  . DM (diabetes mellitus), type 2 with renal complications ([place]) 04/09/4080  . DVT (deep venous thrombosis) ([place]) 03/21/2011  . Dyspnea 11/09/2014  . Effusion of left knee 03/17/2015  . Failure to thrive in adult 05/03/2015  . FOOT PAIN, BILATERAL 12/08/2010  . Generalized weakness 05/20/2015  . GERD 11/14/2007  . Hearing loss 05/13/2009   Qualifier: Diagnosis of  By: Jenny Reichmann MD, Hunt Oris   . Hyperlipemia 11/14/2007   Qualifier: Diagnosis of  By: Jenny Reichmann MD, Hunt Oris    . HYPERLIPIDEMIA 11/14/2007  . HYPERTENSION 11/14/2007  . HYPOTENSION, ORTHOSTATIC 03/06/2011  . Intermittent high-grade heart block 09/05/2009   s/p pacemaker  . Ischemic cardiomyopathy    a. echo (10/15):  mod focal basal septal hypertrophy, EF 40-45%, inf AK, Gr 1 DD, Al sclerosis, mild AI, MAC, mild MR, mod LAE, lipomatous hypertrophy, small effusion (no hemodynamic compromise)   . Memory loss 06/01/2008  . Myalgia and myositis, unspecified 04/24/2011  . Osteoarthrosis, unspecified whether generalized or localized, unspecified site   . OSTEOPOROSIS 06/02/2010  . Other malaise and fatigue 04/24/2011    . Pacemaker-St. Jude 02/02/2008   St. Jude  . Peripheral neuropathy 03/27/2011  . PERIPHERAL VASCULAR DISEASE 11/14/2007  . Prostate cancer ([place])   . Protein-calorie malnutrition, severe ([place]) 05/23/2015  . RENAL INSUFFICIENCY 11/14/2007  . Seborrheic keratosis 06/05/2011  . SECONDARY DM W/PERIPHERAL CIRC D/O UNCONTROLLED 11/07/2010  . Severe back pain   . SYNCOPE 09/08/2008  . Unspecified hearing loss 05/13/2009  . Vertebral compression fracture ([place]) 09/05/2009   Qualifier: Diagnosis of  By: Jenny Reichmann MD, Hunt Oris   . [name] 04/04/2015  . Weight loss 10/10/2014   Past Surgical History:  Procedure Laterality Date  . APPENDECTOMY    . CHOLECYSTECTOMY    . CORONARY ARTERY BYPASS GRAFT  2007  . CYSTOSCOPY    . PACEMAKER INSERTION  2012   Caryl Comes, MD  . prostate needle biopsy    . s/p bilat knee replacement  1992   Paul, MD  . s/p coronary stent x 1      Allergies  Allergen Reactions  . Hydrocodone     hallucination  . Tramadol     hallucination    Outpatient Encounter Prescriptions as of 07/03/2017  Medication Sig  . Amino Acids-Protein Hydrolys (FEEDING SUPPLEMENT, PRO-STAT SUGAR FREE 64,) LIQD Take 30 mLs by mouth 3 (three) times daily with meals.   . carbamide peroxide (DEBROX) 6.5 % otic solution 5 drops. Instil 5 - 10 drops in each ear at bedtime 3 times a week every other month  . docusate sodium (COLACE) 100 MG capsule Take 100 mg by mouth 2 (two) times daily.  Marland Kitchen erythromycin ophthalmic ointment Place 1 application into the right eye 4 (four) times daily. Stop date 07/06/17  . famotidine (PEPCID) 20 MG tablet Take 20 mg by mouth daily as needed for heartburn. For GERD   . fentaNYL (DURAGESIC) 25 MCG/HR patch Apply fresh patch every 3 days to help pain. Remove old patch.  . furosemide (LASIX) 20 MG tablet Take 20 mg by mouth daily.   Marland Kitchen levothyroxine (SYNTHROID, LEVOTHROID) 25 MCG tablet Take 25 mcg by mouth daily.  . metFORMIN (GLUCOPHAGE) 500 MG tablet Take 500 mg by mouth. Take one  twice a day  . nitroGLYCERIN (NITROSTAT) 0.4 MG SL tablet Place 0.4 mg under the tongue every 5 (five) minutes as needed for chest pain.  . Nutritional Supplements (BOOST DIABETIC) LIQD Take 237 mLs by mouth 2 (two) times daily. May also have one at [facility] as needed  . OXYGEN Inhale 1 L/min into the lungs as needed.   Marland Kitchen Propylene Glycol (SYSTANE BALANCE) 0.6 % SOLN Apply to eye. One drop both eyes 2 times a day due to dry eyes  . risperiDONE (RISPERDAL) 0.25 MG tablet Take 0.25 mg by mouth every other day.  . spironolactone (ALDACTONE) 25 MG tablet Take 25 mg by mouth daily.   Marland Kitchen zinc oxide 20 % ointment Apply 1 application topically. Apply ointment twice daily as needed to buttocks , apply to boney prominences mid spine three times daily as needed   No facility-administered encounter medications on file as of 07/03/2017.     Review of Systems  Constitutional: Negative for appetite change, chills and fever.  HENT: Negative for congestion, mouth sores, sore throat and trouble swallowing.   Eyes:       Wears corrective glasses  Respiratory: Negative for cough and shortness of breath.   Cardiovascular: Positive for leg swelling. Negative for chest pain and palpitations.  Gastrointestinal: Negative for abdominal pain, diarrhea, nausea and vomiting.  Genitourinary: Negative for dysuria.  Musculoskeletal: Positive for gait problem.       Uses walker and wheelchair  Neurological: Negative for dizziness, light-headedness and headaches.  Psychiatric/Behavioral: Positive for confusion.    Immunization History  Administered Date(s) Administered  . Influenza Whole 09/23/2010, 09/23/2012  . Influenza,inj,Quad PF,36+ Mos 10/09/2014  . Influenza-Unspecified 09/10/2013, 09/30/2015, 10/05/2016  . PPD Test 05/23/2015  . Pneumococcal Conjugate-13 04/27/2017  . Pneumococcal Polysaccharide-23 03/31/2009  . Td 03/31/2009   Pertinent  Health Maintenance Due  Topic Date Due  . OPHTHALMOLOGY EXAM   12/24/2017 (Originally 06/26/1933)  . INFLUENZA VACCINE  07/24/2017  . HEMOGLOBIN A1C  09/28/2017  . FOOT EXAM  12/28/2017  . URINE MICROALBUMIN  04/30/2018  . PNA vac Low Risk Adult  Completed   Fall Risk  09/26/2015 05/26/2015 04/29/2015 04/04/2015 03/17/2015  Falls in the past year? _0   Number falls in past yr: _1 or more  Injury with Fall? No Yes Yes Yes Yes  Risk Factor Category  - High Fall Risk - High Fall Risk High Fall Risk  Risk for fall due to : Impaired balance/gait History of fall(s);Impaired balance/gait;Impaired mobility - History of fall(s);Impaired balance/gait;Impaired mobility History of fall(s);Impaired balance/gait;Impaired mobility  Follow up Falls evaluation completed Falls evaluation completed - Falls evaluation completed Falls evaluation completed   Functional Status Survey:    Vitals:   07/03/17 1636  BP: 126/62  Pulse: 80  Resp: 20  Temp: (!) 97.3 F (36.3 C)  TempSrc: Oral  SpO2: 99%  Weight: 151 lb (68.5 kg)  Height: _2  (1.753 m)   Body mass index is 22.3 kg/m. Physical Exam  Constitutional:  Elderly male in no acute distress  HENT:  Head: Normocephalic and atraumatic.  Eyes: Conjunctivae are normal. Pupils are equal, round, and reactive to light.  Right upper eyelid stye present with mild erythema, no signs of infection, normal sclera and conjunctiva  Neck: Normal range of motion. Neck supple.  Cardiovascular: Normal rate and regular rhythm.   Murmur heard. Pulmonary/Chest: Effort normal and breath sounds normal.  Left chest wall pacemaker present  Abdominal: Soft. Bowel sounds are normal. There is no tenderness.  Musculoskeletal: He exhibits edema.  Trace leg edema, can move all 4 extremities  Lymphadenopathy:    He has no cervical adenopathy.  Neurological: He is alert.  Oriented to person only  Skin: Skin is warm and dry. He is not diaphoretic.  Psychiatric: He has a normal mood and affect.    Labs  reviewed:  Recent Labs  11/01/16 03/29/17 04/11/17  NA 139 137 134*  K 4.4 4.0 4.4  BUN 48* 48* 57*  CREATININE 1.2 1.1 1.3    Recent Labs  11/01/16 03/29/17  AST 22 16  ALT 14 8*  ALKPHOS 106 112    Recent Labs  11/01/16 03/29/17  WBC 5.6 6.4  HGB 14.4 13.8  HCT 43 41  PLT 124* 132*   Lab Results  Component Value Date   TSH 4.71 03/29/2017   Lab Results  Component Value Date   HGBA1C 6.7 03/29/2017  Lab Results  Component Value Date   CHOL 132 02/07/2015   HDL 33 (A) 02/07/2015   LDLCALC 76 02/07/2015   LDLDIRECT 85.6 07/19/2010   TRIG 115 02/07/2015   CHOLHDL 4 03/06/2011    Significant Diagnostic Results in last 30 days:  No results found.  Assessment/Plan  Type 2 diabetes mellitus with renal impairment Lab Results  Component Value Date   HGBA1C 6.7 03/29/2017   Continue metformin 500 mg bid and check a1c. If a1c <7, decrease metformin to 500 mg daily. Avoid hypoglycemia. Check urine microalbumin to creatinine ratio  ckd stage 3 Monitor renal function periodically  Right eye stye Improved and has decreased in size. No signs of infection. Complete his erythromycin ointment and monitor  CAD Chest pain free. Continue prn NTG.   Family/ staff Communication: reviewed care plan with patient and charge nurse.    Labs/tests ordered:  A1c, urine microalbumin to cr ratio   Blanchie Serve, MD Internal Medicine [facility] [address] [place], [place] [address] Cell Phone (Monday-Friday 8 am - 5 pm): [phone] On Call: [phone] and follow prompts after 5 pm and on weekends Office Phone: [phone] Office Fax: [phone]

## 2017-07-04 DIAGNOSIS — E118 Type 2 diabetes mellitus with unspecified complications: Secondary | ICD-10-CM | POA: Diagnosis not present

## 2017-07-04 DIAGNOSIS — N179 Acute kidney failure, unspecified: Secondary | ICD-10-CM | POA: Diagnosis not present

## 2017-07-04 LAB — HEMOGLOBIN A1C: HEMOGLOBIN A1C: 5.5

## 2017-07-05 LAB — MICROALBUMIN, URINE: MICROALB UR: 2.7

## 2017-07-11 DIAGNOSIS — L814 Other melanin hyperpigmentation: Secondary | ICD-10-CM | POA: Diagnosis not present

## 2017-07-11 DIAGNOSIS — L820 Inflamed seborrheic keratosis: Secondary | ICD-10-CM | POA: Diagnosis not present

## 2017-07-11 DIAGNOSIS — L821 Other seborrheic keratosis: Secondary | ICD-10-CM | POA: Diagnosis not present

## 2017-07-19 ENCOUNTER — Telehealth: Payer: Self-pay | Admitting: *Deleted

## 2017-07-19 NOTE — Telephone Encounter (Signed)
Patient's daughter, [name] called and left message on Clinical Intake Line wanting to speak with Dr. Bubba Camp directly concerning patient's medications. Stated that she had some concerns. [place] Skilled patient.  Requested you to call her [phone]

## 2017-07-22 ENCOUNTER — Encounter: Payer: Self-pay | Admitting: Internal Medicine

## 2017-07-22 ENCOUNTER — Non-Acute Institutional Stay (SKILLED_NURSING_FACILITY): Payer: Medicare Other | Admitting: Internal Medicine

## 2017-07-22 DIAGNOSIS — R2681 Unsteadiness on feet: Secondary | ICD-10-CM

## 2017-07-22 DIAGNOSIS — W19XXXA Unspecified fall, initial encounter: Secondary | ICD-10-CM

## 2017-07-22 DIAGNOSIS — F0281 Dementia in other diseases classified elsewhere with behavioral disturbance: Secondary | ICD-10-CM | POA: Diagnosis not present

## 2017-07-22 DIAGNOSIS — S51012A Laceration without foreign body of left elbow, initial encounter: Secondary | ICD-10-CM | POA: Diagnosis not present

## 2017-07-22 DIAGNOSIS — G309 Alzheimer's disease, unspecified: Secondary | ICD-10-CM | POA: Diagnosis not present

## 2017-07-22 DIAGNOSIS — F02818 Dementia in other diseases classified elsewhere, unspecified severity, with other behavioral disturbance: Secondary | ICD-10-CM | POA: Insufficient documentation

## 2017-07-22 NOTE — Progress Notes (Signed)
Location:  [facility] Room Number: 9 Place of Service:  SNF ([phone]) Provider:  Blanchie Serve, MD  Blanchie Serve, MD  Patient Care Team: Blanchie Serve, MD as PCP - General (Internal Medicine) Irine Seal, MD as Consulting Physician (Urology) Verl Blalock, Marijo Conception, MD (Inactive) as Consulting Physician (Cardiology) [place] Crenshaw, Denice Bors, MD as Consulting Physician (Cardiology) Rigoberto Noel, MD as Consulting Physician (Pulmonary Disease) Ngetich, Nelda Bucks, NP as Nurse Practitioner (Family Medicine)  Extended Emergency Contact Information Primary Emergency Contact: Kathline Magic, Rosezella Rumpf of [place] Phone: [phone] Relation: Daughter Secondary Emergency Contact: Johnnette Gourd [place] of [place] Phone: [phone] Mobile Phone: [phone] Relation: Son  Code Status:  DNR Goals of care: Advanced Directive information Advanced Directives 06/28/2017  Does Patient Have a Medical Advance Directive? Yes  Type of Advance Directive Out of facility DNR (pink MOST or yellow form);[place];Living will  Does patient want to make changes to medical advance directive? -  Copy of [place] in Chart? Yes  Pre-existing out of facility DNR order (yellow form or pink MOST form) Yellow form placed in chart (order not valid for inpatient use);Pink MOST form placed in chart (order not valid for inpatient use)     Chief Complaint  Patient presents with  . Acute Visit    refusal of care, medication concern, fall    HPI:  Pt is a 83 y.o. male seen today for an acute visit. He has been refusing personal care intermittently. He has dementia and was on risperdal before for behavioral disturbance. This medication was taken off in June and following this per daughter, his behavioral disturbance worsened. He is now back on risperdal 0.25 mg every other day at bedtime. Per nursing this has  helped him some and he is allowing care some days but refusing it intermittently as well. Daughter mentions that pt had been on namenda and aricept in past without desired result and had been discontinued more than a year back.  Nursing reports that pt had a fall around 2 am today with no apparent injury reported. Patient was found sitting in the side of his bed with walker in front of him and feet stretch forward. Per patient, rhe slipped while trying to get into bed unassisted. He sustained a skin tear to left elbow. He was placed on neurochecks.    Past Medical History:  Diagnosis Date  . ANEMIA-IRON DEFICIENCY 05/27/2007  . ANXIETY 11/14/2007  . BENIGN PROSTATIC HYPERTROPHY 11/14/2007  . CAROTID ARTERY DISEASE 10/02/2010  . Chronic combined systolic and diastolic CHF (congestive heart failure) ([place]) 10/10/2014  . 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 COLONIC POLYPS, HX OF 05/27/2007  . CORONARY ARTERY DISEASE 11/14/2007   prior bypass [phone]; Inferior MI 11/07; Severe 3 vessel disease; occluded SVG to RCA; SVG to OM subtotalled, SVG to diagonal with ostial 70 and 80 mid; EF 45. PCI of SVG to OM. FU myovue showed EF 56 with inferior infarct and very mild peri-infarct ischemia  . DEPRESSION 11/14/2007  . DIABETES MELLITUS, TYPE II 11/14/2007  . DIVERTICULITIS, HX OF 05/27/2007  . DM (diabetes mellitus), type 2 with renal complications ([place]) 03/06/9701  . DVT (deep venous thrombosis) ([place]) 03/21/2011  . Dyspnea 11/09/2014  . Effusion of left knee 03/17/2015  . Failure to thrive in adult 05/03/2015  . FOOT PAIN, BILATERAL  12/08/2010  . Generalized weakness 05/20/2015  . GERD 11/14/2007  . Hearing loss 05/13/2009   Qualifier: Diagnosis of  By: Jenny Reichmann MD, Hunt Oris   . Hyperlipemia 11/14/2007   Qualifier: Diagnosis of  By: Jenny Reichmann MD, Hunt Oris    . HYPERLIPIDEMIA 11/14/2007  . HYPERTENSION 11/14/2007  . HYPOTENSION, ORTHOSTATIC 03/06/2011  . Intermittent high-grade heart block  09/05/2009   s/p pacemaker  . Ischemic cardiomyopathy    a. echo (10/15):  mod focal basal septal hypertrophy, EF 40-45%, inf AK, Gr 1 DD, Al sclerosis, mild AI, MAC, mild MR, mod LAE, lipomatous hypertrophy, small effusion (no hemodynamic compromise)   . Memory loss 06/01/2008  . Myalgia and myositis, unspecified 04/24/2011  . Osteoarthrosis, unspecified whether generalized or localized, unspecified site   . OSTEOPOROSIS 06/02/2010  . Other malaise and fatigue 04/24/2011  . Pacemaker-St. Jude 02/02/2008   St. Jude  . Peripheral neuropathy 03/27/2011  . PERIPHERAL VASCULAR DISEASE 11/14/2007  . Prostate cancer ([place])   . Protein-calorie malnutrition, severe ([place]) 05/23/2015  . RENAL INSUFFICIENCY 11/14/2007  . Seborrheic keratosis 06/05/2011  . SECONDARY DM W/PERIPHERAL CIRC D/O UNCONTROLLED 11/07/2010  . Severe back pain   . SYNCOPE 09/08/2008  . Unspecified hearing loss 05/13/2009  . Vertebral compression fracture ([place]) 09/05/2009   Qualifier: Diagnosis of  By: Jenny Reichmann MD, Hunt Oris   . [name] 04/04/2015  . Weight loss 10/10/2014   Past Surgical History:  Procedure Laterality Date  . APPENDECTOMY    . CHOLECYSTECTOMY    . CORONARY ARTERY BYPASS GRAFT  2007  . CYSTOSCOPY    . PACEMAKER INSERTION  2012   Caryl Comes, MD  . prostate needle biopsy    . s/p bilat knee replacement  1992   Paul, MD  . s/p coronary stent x 1      Allergies  Allergen Reactions  . Hydrocodone     hallucination  . Tramadol     hallucination    Outpatient Encounter Prescriptions as of 07/22/2017  Medication Sig  . Amino Acids-Protein Hydrolys (FEEDING SUPPLEMENT, PRO-STAT SUGAR FREE 64,) LIQD Take 30 mLs by mouth 3 (three) times daily with meals.   . carbamide peroxide (DEBROX) 6.5 % otic solution 5 drops. Instil 5 - 10 drops in each ear at bedtime 3 times a week every other month  . docusate sodium (COLACE) 100 MG capsule Take 100 mg by mouth 2 (two) times daily.  . famotidine (PEPCID) 20 MG tablet Take 20 mg by mouth  daily as needed for heartburn. For GERD   . fentaNYL (DURAGESIC) 25 MCG/HR patch Apply fresh patch every 3 days to help pain. Remove old patch.  . furosemide (LASIX) 20 MG tablet Take 20 mg by mouth daily.   Marland Kitchen levothyroxine (SYNTHROID, LEVOTHROID) 25 MCG tablet Take 25 mcg by mouth daily.  . metFORMIN (GLUCOPHAGE) 500 MG tablet Take 500 mg by mouth. Take one twice a day  . nitroGLYCERIN (NITROSTAT) 0.4 MG SL tablet Place 0.4 mg under the tongue every 5 (five) minutes as needed for chest pain.  . Nutritional Supplements (BOOST DIABETIC) LIQD Take 237 mLs by mouth 2 (two) times daily. May also have one at [facility] as needed  . OXYGEN Inhale 1 L/min into the lungs as needed.   Marland Kitchen Propylene Glycol (SYSTANE BALANCE) 0.6 % SOLN Apply to eye. One drop both eyes 2 times a day due to dry eyes  . risperiDONE (RISPERDAL) 0.25 MG tablet Take 0.25 mg by mouth every other day.  . spironolactone (ALDACTONE)  25 MG tablet Take 25 mg by mouth daily.   Marland Kitchen zinc oxide 20 % ointment Apply 1 application topically. Apply ointment twice daily as needed to buttocks , apply to boney prominences mid spine three times daily as needed  . [DISCONTINUED] erythromycin ophthalmic ointment Place 1 application into the right eye 4 (four) times daily. Stop date 07/06/17   No facility-administered encounter medications on file as of 07/22/2017.     Review of Systems  Constitutional: Negative for appetite change, chills, diaphoresis, fatigue and fever.  HENT: Negative for congestion, mouth sores and rhinorrhea.   Eyes: Positive for visual disturbance.       Has corrective glasses  Respiratory: Positive for shortness of breath (on chronic oxygen by nasal canula). Negative for cough and wheezing.   Cardiovascular: Negative for chest pain and palpitations.  Gastrointestinal: Negative for abdominal pain, constipation, diarrhea, nausea and vomiting.  Genitourinary: Negative for dysuria.  Musculoskeletal: Positive for gait problem.       Uses  a walker and wheelchair and needs assistance with transfer  Neurological: Negative for syncope, light-headedness and headaches.  Psychiatric/Behavioral: Positive for behavioral problems and confusion.    Immunization History  Administered Date(s) Administered  . Influenza Whole 09/23/2010, 09/23/2012  . Influenza,inj,Quad PF,36+ Mos 10/09/2014  . Influenza-Unspecified 09/10/2013, 09/30/2015, 10/05/2016  . PPD Test 05/23/2015  . Pneumococcal Conjugate-13 04/27/2017  . Pneumococcal Polysaccharide-23 03/31/2009  . Td 03/31/2009   Pertinent  Health Maintenance Due  Topic Date Due  . OPHTHALMOLOGY EXAM  12/24/2017 (Originally 06/26/1933)  . INFLUENZA VACCINE  07/24/2017  . HEMOGLOBIN A1C  09/28/2017  . FOOT EXAM  12/28/2017  . URINE MICROALBUMIN  04/30/2018  . PNA vac Low Risk Adult  Completed   Fall Risk  09/26/2015 05/26/2015 04/29/2015 04/04/2015 03/17/2015  Falls in the past year? Yes Yes Yes Yes Yes  Number falls in past yr: 1 1 1 1 2  or more  Injury with Fall? No Yes Yes Yes Yes  Risk Factor Category  - High Fall Risk - High Fall Risk High Fall Risk  Risk for fall due to : Impaired balance/gait History of fall(s);Impaired balance/gait;Impaired mobility - History of fall(s);Impaired balance/gait;Impaired mobility History of fall(s);Impaired balance/gait;Impaired mobility  Follow up Falls evaluation completed Falls evaluation completed - Falls evaluation completed Falls evaluation completed   Functional Status Survey:    Vitals:   07/22/17 1455  BP: 111/71  Pulse: 81  Resp: 20  Temp: (!) 97 F (36.1 C)  TempSrc: Oral  SpO2: 96%  Weight: 150 lb (68 kg)  Height: 5' 9"  (1.753 m)   Body mass index is 22.15 kg/m. Physical Exam  Constitutional: He appears well-developed and well-nourished. No distress.  HENT:  Head: Normocephalic and atraumatic.  Has hearing aid  Eyes: Pupils are equal, round, and reactive to light. EOM are normal.  Neck: Normal range of motion. No thyromegaly  present.  Cardiovascular: Normal rate and regular rhythm.   Murmur heard. Pulmonary/Chest: Effort normal and breath sounds normal. No respiratory distress. He has no wheezes.  Abdominal: Soft. Bowel sounds are normal. He exhibits no distension. There is no tenderness. There is no guarding.  Musculoskeletal: He exhibits edema.  Can move all 4 extremities  Lymphadenopathy:    He has no cervical adenopathy.  Neurological: He is alert.  Oriented to self only  Skin: Skin is warm and dry. He is not diaphoretic.  Skin tear to Left elbow with dry dressing in place  Psychiatric:  Pleasantly confused  Labs reviewed:  Recent Labs  11/01/16 03/29/17 04/11/17  NA 139 137 134*  K 4.4 4.0 4.4  BUN 48* 48* 57*  CREATININE 1.2 1.1 1.3    Recent Labs  11/01/16 03/29/17  AST 22 16  ALT 14 8*  ALKPHOS 106 112    Recent Labs  11/01/16 03/29/17  WBC 5.6 6.4  HGB 14.4 13.8  HCT 43 41  PLT 124* 132*   Lab Results  Component Value Date   TSH 4.71 03/29/2017   Lab Results  Component Value Date   HGBA1C 6.7 03/29/2017   Lab Results  Component Value Date   CHOL 132 02/07/2015   HDL 33 (A) 02/07/2015   LDLCALC 76 02/07/2015   LDLDIRECT 85.6 07/19/2010   TRIG 115 02/07/2015   CHOLHDL 4 03/06/2011    Significant Diagnostic Results in last 30 days:  No results found.  Assessment/Plan  Fall initial encounter Fall this am, skin tear to left elbow, stable with neurochecks, fall likely from unsteady gait and is a mechanical fall. He has been able to get around with assistance and denies pain, fall precautions, has poor safety awareness, frequent nursing check required. He is pleasantly confused and appears to be at baseline this visit.   Left elbow skin tear Clean area with NS and apply dressing, fall precautions, maintain hydration for skin integrity  Unsteady gait To use walker and wheelchair and needs assistance with transfer. He has poor safety awareness and remains high fall  risk.   Dementia with behavioral disturbance Lab Results  Component Value Date   TSH 4.71 03/29/2017   Currently on risperdal 0.25 mg every other day. Start rivastigmine patch 4.6 mg daily for now. Common side effects explained to family members and will monitor patient. Supportive care. If exelon patch helps with his symptom, consider discontinuing risperdal next visit. Monitor his sleep cycle.   Family/ staff Communication: reviewed care plan with patient, his son and daughter over the phone and the charge nurse.   Labs/tests ordered:  None  I have spent greater than 40 minutes in this visit with >50% of the visit time spent in counselling patient's family about care plan, answering their questions. Family/ HCPOA agree with care plan.   Blanchie Serve, MD Internal Medicine [facility] [address] [place], [place] [address] Cell Phone (Monday-Friday 8 am - 5 pm): [phone] On Call: [phone] and follow prompts after 5 pm and on weekends Office Phone: [phone] Office Fax: [phone]

## 2017-07-22 NOTE — Telephone Encounter (Signed)
Spoke with patient's son Javante Nilsson and daughter Anda Kraft over the phone and addressed concerns. See note for detail.

## 2017-07-26 ENCOUNTER — Encounter: Payer: Self-pay | Admitting: Internal Medicine

## 2017-07-26 ENCOUNTER — Non-Acute Institutional Stay (SKILLED_NURSING_FACILITY): Payer: Medicare Other

## 2017-07-26 ENCOUNTER — Non-Acute Institutional Stay (SKILLED_NURSING_FACILITY): Payer: Medicare Other | Admitting: Internal Medicine

## 2017-07-26 DIAGNOSIS — Z0000 Encounter for general adult medical examination without abnormal findings: Secondary | ICD-10-CM | POA: Diagnosis not present

## 2017-07-26 DIAGNOSIS — R400 Somnolence: Secondary | ICD-10-CM

## 2017-07-26 DIAGNOSIS — G309 Alzheimer's disease, unspecified: Secondary | ICD-10-CM

## 2017-07-26 DIAGNOSIS — F0281 Dementia in other diseases classified elsewhere with behavioral disturbance: Secondary | ICD-10-CM

## 2017-07-26 NOTE — Progress Notes (Signed)
Subjective:   Zachary Harris is a 83 y.o. male who presents for an Initial Medicare Annual Wellness Visit at [facility]; incapacitated patient unable to answer questions appropriately    Objective:    Today's Vitals   07/26/17 1034  BP: 122/66  Pulse: 95  Temp: 98.2 F (36.8 C)  TempSrc: Oral  SpO2: 95%  Weight: 150 lb (68 kg)  Height: _0  (1.753 m)   Body mass index is 22.15 kg/m.  Current Medications (verified) Outpatient Encounter Prescriptions as of 07/26/2017  Medication Sig  . Amino Acids-Protein Hydrolys (FEEDING SUPPLEMENT, PRO-STAT SUGAR FREE 64,) LIQD Take 30 mLs by mouth 3 (three) times daily with meals.   . carbamide peroxide (DEBROX) 6.5 % otic solution 5 drops. Instil 5 - 10 drops in each ear at bedtime 3 times a week every other month  . docusate sodium (COLACE) 100 MG capsule Take 100 mg by mouth 2 (two) times daily.  . famotidine (PEPCID) 20 MG tablet Take 20 mg by mouth daily as needed for heartburn. For GERD   . fentaNYL (DURAGESIC) 25 MCG/HR patch Apply fresh patch every 3 days to help pain. Remove old patch.  . furosemide (LASIX) 20 MG tablet Take 20 mg by mouth daily.   Marland Kitchen levothyroxine (SYNTHROID, LEVOTHROID) 25 MCG tablet Take 25 mcg by mouth daily.  . metFORMIN (GLUCOPHAGE) 500 MG tablet Take 500 mg by mouth. Take one twice a day  . nitroGLYCERIN (NITROSTAT) 0.4 MG SL tablet Place 0.4 mg under the tongue every 5 (five) minutes as needed for chest pain.  . Nutritional Supplements (BOOST DIABETIC) LIQD Take 237 mLs by mouth 2 (two) times daily. May also [facility] one at [facility] as needed  . OXYGEN Inhale 1 L/min into the lungs as needed.   Marland Kitchen Propylene Glycol (SYSTANE BALANCE) 0.6 % SOLN Apply to eye. One drop both eyes 2 times a day due to dry eyes  . risperiDONE (RISPERDAL) 0.25 MG tablet Take 0.25 mg by mouth every other day.  . spironolactone (ALDACTONE) 25 MG tablet Take 25 mg by mouth daily.   Marland Kitchen zinc oxide 20 % ointment Apply 1  application topically. Apply ointment twice daily as needed to buttocks , apply to boney prominences mid spine three times daily as needed   No facility-administered encounter medications on file as of 07/26/2017.     Allergies (verified) Hydrocodone and Tramadol   History: Past Medical History:  Diagnosis Date  . ANEMIA-IRON DEFICIENCY 05/27/2007  . ANXIETY 11/14/2007  . BENIGN PROSTATIC HYPERTROPHY 11/14/2007  . CAROTID ARTERY DISEASE 10/02/2010  . Chronic combined systolic and diastolic CHF (congestive heart failure) ([place]) 10/10/2014  . CKD stage 3 due to type 1 diabetes mellitus ([place]) 03/17/2015  . COLONIC POLYPS, HX OF 05/27/2007  . CORONARY ARTERY DISEASE 11/14/2007   prior bypass [phone]; Inferior MI 11/07; Severe 3 vessel disease; occluded SVG to RCA; SVG to OM subtotalled, SVG to diagonal with ostial 70 and 80 mid; EF 45. PCI of SVG to OM. FU myovue showed EF 56 with inferior infarct and very mild peri-infarct ischemia  . DEPRESSION 11/14/2007  . DIABETES MELLITUS, TYPE II 11/14/2007  . DIVERTICULITIS, HX OF 05/27/2007  . DM (diabetes mellitus), type 2 with renal complications ([place]) 7/35/3299  . DVT (deep venous thrombosis) ([place]) 03/21/2011  . Dyspnea 11/09/2014  . Effusion of left knee 03/17/2015  . Failure to thrive in adult 05/03/2015  . FOOT PAIN, BILATERAL 12/08/2010  . Generalized weakness  05/20/2015  . GERD 11/14/2007  . Hearing loss 05/13/2009   Qualifier: Diagnosis of  By: Jenny Reichmann MD, Hunt Oris   . Hyperlipemia 11/14/2007   Qualifier: Diagnosis of  By: Jenny Reichmann MD, Hunt Oris    . HYPERLIPIDEMIA 11/14/2007  . HYPERTENSION 11/14/2007  . HYPOTENSION, ORTHOSTATIC 03/06/2011  . Intermittent high-grade heart block 09/05/2009   s/p pacemaker  . Ischemic cardiomyopathy    a. echo (10/15):  mod focal basal septal hypertrophy, EF 40-45%, inf AK, Gr 1 DD, Al sclerosis, mild AI, MAC, mild MR, mod LAE, lipomatous hypertrophy, small effusion (no hemodynamic compromise)   . Memory loss 06/01/2008   . Myalgia and myositis, unspecified 04/24/2011  . Osteoarthrosis, unspecified whether generalized or localized, unspecified site   . OSTEOPOROSIS 06/02/2010  . Other malaise and fatigue 04/24/2011  . Pacemaker-St. Jude 02/02/2008   St. Jude  . Peripheral neuropathy 03/27/2011  . PERIPHERAL VASCULAR DISEASE 11/14/2007  . Prostate cancer ([place])   . Protein-calorie malnutrition, severe ([place]) 05/23/2015  . RENAL INSUFFICIENCY 11/14/2007  . Seborrheic keratosis 06/05/2011  . SECONDARY DM W/PERIPHERAL CIRC D/O UNCONTROLLED 11/07/2010  . Severe back pain   . SYNCOPE 09/08/2008  . Unspecified hearing loss 05/13/2009  . Vertebral compression fracture ([place]) 09/05/2009   Qualifier: Diagnosis of  By: Jenny Reichmann MD, Hunt Oris   . [name] 04/04/2015  . Weight loss 10/10/2014   Past Surgical History:  Procedure Laterality Date  . APPENDECTOMY    . CHOLECYSTECTOMY    . CORONARY ARTERY BYPASS GRAFT  2007  . CYSTOSCOPY    . PACEMAKER INSERTION  2012   Caryl Comes, MD  . prostate needle biopsy    . s/p bilat knee replacement  1992   Paul, MD  . s/p coronary stent x 1     Family History  Problem Relation Age of Onset  . Diabetes Father        30  . Cancer Mother    Social History   Occupational History  . retired operated Higher education careers adviser Retired   Social History Main Topics  . Smoking status: Former Smoker    Packs/day: 1.00    Years: 50.00    Types: Cigarettes    Quit date: 10/18/1983  . Smokeless tobacco: Never Used  . Alcohol use No  . Drug use: No  . Sexual activity: No   Tobacco Counseling Counseling given: Not Answered   Activities of Daily Living In your present state of health, do you have any difficulty performing the following activities: 07/26/2017  Hearing? Y  Vision? Y  Difficulty concentrating or making decisions? Y  Walking or climbing stairs? Y  Dressing or bathing? Y  Doing errands, shopping? Y  Preparing Food and eating ? Y  Using the Toilet? Y  In the past six months, have  you accidently leaked urine? Y  Do you have problems with loss of bowel control? Y  Managing your Medications? Y  Managing your Finances? Y  Housekeeping or managing your Housekeeping? Y  Some recent data might be hidden    Immunizations and Health Maintenance Immunization History  Administered Date(s) Administered  . Influenza Whole 09/23/2010, 09/23/2012  . Influenza,inj,Quad PF,36+ Mos 10/09/2014  . Influenza-Unspecified 09/10/2013, 09/30/2015, 10/05/2016  . PPD Test 05/23/2015  . Pneumococcal Conjugate-13 04/27/2017  . Pneumococcal Polysaccharide-23 03/31/2009  . Td 03/31/2009   Health Maintenance Due  Topic Date Due  . INFLUENZA VACCINE  07/24/2017    Patient Care Team: Blanchie Serve, MD as PCP - General (Internal Medicine)  Irine Seal, MD as Consulting Physician (Urology) Verl Blalock, Marijo Conception, MD (Inactive) as Consulting Physician (Cardiology) [place] Crenshaw, Denice Bors, MD as Consulting Physician (Cardiology) Rigoberto Noel, MD as Consulting Physician (Pulmonary Disease) Ngetich, Nelda Bucks, NP as Nurse Practitioner (Family Medicine)  Indicate any recent Medical Services you may have received from other than [facility] providers in the past year (date may be approximate).    Assessment:   This is a routine wellness examination for [place].   Hearing/Vision screen No exam data present  Dietary issues and exercise activities discussed: Current Exercise Habits: The patient does not participate in regular exercise at present, Exercise limited by: neurologic condition(s)  Goals    None     Depression Screen PHQ 2/9 Scores 07/26/2017 09/26/2015 05/26/2015 04/04/2015  PHQ - 2 Score - 0 0 0  PHQ- 9 Score - - - -  Exception Documentation Medical reason - - -    Fall Risk Fall Risk  07/26/2017 09/26/2015 05/26/2015 04/29/2015 04/04/2015  Falls in the past year? _0   Number falls in past yr: _1 Injury with Fall? No No Yes Yes Yes  Risk Factor Category  - -  High Fall Risk - High Fall Risk  Risk for fall due to : - Impaired balance/gait History of fall(s);Impaired balance/gait;Impaired mobility - History of fall(s);Impaired balance/gait;Impaired mobility  Follow up - Falls evaluation completed Falls evaluation completed - Falls evaluation completed    Cognitive Function: MMSE - Mini Mental State Exam 07/26/2017 02/15/2015 02/14/2015 07/30/2014 07/13/2014  Not completed: Unable to complete - - - -  Orientation to time - _2 Orientation to Place - _3 Registration - _4 Attention/ Calculation - _5 Recall - _6 Language- name 2 objects - _7 Language- repeat - _8 Language- follow 3 step command - _9 Language- read & follow direction - _10 Write a sentence - _11 Copy design - _12 0  Total score - _13 Screening Tests Health Maintenance  Topic Date Due  . INFLUENZA VACCINE  07/24/2017  . OPHTHALMOLOGY EXAM  12/24/2017 (Originally 06/26/1933)  . HEMOGLOBIN A1C  09/28/2017  . FOOT EXAM  12/28/2017  . URINE MICROALBUMIN  04/30/2018  . TETANUS/TDAP  04/01/2019  . PNA vac Low Risk Adult  Completed        Plan:    I have personally reviewed and addressed the Medicare Annual Wellness questionnaire and have noted the following in the patient's chart:  A. Medical and social history B. Use of alcohol, tobacco or illicit drugs  C. Current medications and supplements D. Functional ability and status E.  Nutritional status F.  Physical activity G. Advance directives H. List of other physicians I.  Hospitalizations, surgeries, and ER visits in previous 12 months J.  [place] to include hearing, vision, cognitive, depression L. Referrals and appointments - none  In addition, I am unable to review and discuss with incapacitated patient certain preventive protocols, quality metrics, and best practice recommendations. A written personalized care plan for preventive  services as well as general preventive health recommendations were provided to patient.  See attached scanned questionnaire for additional information.   Signed,  Rich Reining, RN Nurse Health Advisor   Quick Notes   Health Maintenance: Eye exam due     Abnormal Screen: Unable to complete mental exam     Patient Concerns: None     Nurse Concerns: None

## 2017-07-26 NOTE — Progress Notes (Signed)
Location:  [facility] Room Number: 9 Place of Service:  SNF [phone]) Provider:  Blanchie Serve, MD  Blanchie Serve, MD  Patient Care Team: Blanchie Serve, MD as PCP - General (Internal Medicine) Irine Seal, MD as Consulting Physician (Urology) Verl Blalock, Marijo Conception, MD (Inactive) as Consulting Physician (Cardiology) [place]shaw, Denice Bors, MD as Consulting Physician (Cardiology) Rigoberto Noel, MD as Consulting Physician (Pulmonary Disease) Ngetich, Nelda Bucks, NP as Nurse Practitioner (Family Medicine)  Extended Emergency Contact Information Primary Emergency Contact: Kathline Magic, Rosezella Rumpf of [place] Phone: [phone] Relation: Daughter Secondary Emergency Contact: Johnnette Gourd [place] of [place] Phone: [phone] Mobile Phone: [phone] Relation: Son  Code Status:  DNR Goals of care: Advanced Directive information Advanced Directives 07/26/2017  Does Patient Have a Medical Advance Directive? Yes  Type of Paramedic of [place];Living will;Out of facility DNR (pink MOST or yellow form)  Does patient want to make changes to medical advance directive? No - Patient declined  Copy of [place] in Chart? Yes  Pre-existing out of facility DNR order (yellow form or pink MOST form) Yellow form placed in chart (order not valid for inpatient use);Pink MOST form placed in chart (order not valid for inpatient use)     Chief Complaint  Patient presents with  . Acute Visit    lethargy, weakness    HPI:  Pt is a 83 y.o. male seen today for an acute visit. He has been lethargic and has required increased need with assistance for last few days. Staff have noticed him to be weak. He is refusing feeding. There has been concern of some mucus in between his thighs during cleaning yesterday by nursing. None reported after that 1 incident. He is seen in his room today with his  caregiver and charge nurse at bedside. He smiles but participates minimally in conversation. Difficult to obtain HPI and ROS from pt today. Last visit. He was started on rivastigmine with his ongoing dementia with behavioral disturbance. He was participating in hpi and ros last visit.    Past Medical History:  Diagnosis Date  . ANEMIA-IRON DEFICIENCY 05/27/2007  . ANXIETY 11/14/2007  . BENIGN PROSTATIC HYPERTROPHY 11/14/2007  . CAROTID ARTERY DISEASE 10/02/2010  . Chronic combined systolic and diastolic CHF (congestive heart failure) ([place]) 10/10/2014  . CKD stage 3 due to type 1 diabetes mellitus ([place]) 03/17/2015  . COLONIC POLYPS, HX OF 05/27/2007  . CORONARY ARTERY DISEASE 11/14/2007   prior bypass [phone]; Inferior MI 11/07; Severe 3 vessel disease; occluded SVG to RCA; SVG to OM subtotalled, SVG to diagonal with ostial 70 and 80 mid; EF 45. PCI of SVG to OM. FU myovue showed EF 56 with inferior infarct and very mild peri-infarct ischemia  . DEPRESSION 11/14/2007  . DIABETES MELLITUS, TYPE II 11/14/2007  . DIVERTICULITIS, HX OF 05/27/2007  . DM (diabetes mellitus), type 2 with renal complications ([place]) 09/16/2682  . DVT (deep venous thrombosis) ([place]) 03/21/2011  . Dyspnea 11/09/2014  . Effusion of left knee 03/17/2015  . Failure to thrive in adult 05/03/2015  . FOOT PAIN, BILATERAL 12/08/2010  . Generalized weakness 05/20/2015  . GERD 11/14/2007  . Hearing loss 05/13/2009   Qualifier: Diagnosis of  By: Jenny Reichmann MD, Hunt Oris   . Hyperlipemia 11/14/2007   Qualifier: Diagnosis of  By: Jenny Reichmann MD, Hunt Oris    . HYPERLIPIDEMIA 11/14/2007  . HYPERTENSION  11/14/2007  . HYPOTENSION, ORTHOSTATIC 03/06/2011  . Intermittent high-grade heart block 09/05/2009   s/p pacemaker  . Ischemic cardiomyopathy    a. echo (10/15):  mod focal basal septal hypertrophy, EF 40-45%, inf AK, Gr 1 DD, Al sclerosis, mild AI, MAC, mild MR, mod LAE, lipomatous hypertrophy, small effusion (no hemodynamic compromise)   . Memory  loss 06/01/2008  . Myalgia and myositis, unspecified 04/24/2011  . Osteoarthrosis, unspecified whether generalized or localized, unspecified site   . OSTEOPOROSIS 06/02/2010  . Other malaise and fatigue 04/24/2011  . Pacemaker-St. Jude 02/02/2008   St. Jude  . Peripheral neuropathy 03/27/2011  . PERIPHERAL VASCULAR DISEASE 11/14/2007  . Prostate cancer ([place])   . Protein-calorie malnutrition, severe ([place]) 05/23/2015  . RENAL INSUFFICIENCY 11/14/2007  . Seborrheic keratosis 06/05/2011  . SECONDARY DM W/PERIPHERAL CIRC D/O UNCONTROLLED 11/07/2010  . Severe back pain   . SYNCOPE 09/08/2008  . Unspecified hearing loss 05/13/2009  . Vertebral compression fracture ([place]) 09/05/2009   Qualifier: Diagnosis of  By: Jenny Reichmann MD, Hunt Oris   . [name] 04/04/2015  . Weight loss 10/10/2014   Past Surgical History:  Procedure Laterality Date  . APPENDECTOMY    . CHOLECYSTECTOMY    . CORONARY ARTERY BYPASS GRAFT  2007  . CYSTOSCOPY    . PACEMAKER INSERTION  2012   Caryl Comes, MD  . prostate needle biopsy    . s/p bilat knee replacement  1992   Paul, MD  . s/p coronary stent x 1      Allergies  Allergen Reactions  . Hydrocodone     hallucination  . Tramadol     hallucination    Outpatient Encounter Prescriptions as of 07/26/2017  Medication Sig  . Amino Acids-Protein Hydrolys (FEEDING SUPPLEMENT, PRO-STAT SUGAR FREE 64,) LIQD Take 30 mLs by mouth 3 (three) times daily with meals.   . carbamide peroxide (DEBROX) 6.5 % otic solution 5 drops. Instil 5 - 10 drops in each ear at bedtime 3 times a week every other month  . docusate sodium (COLACE) 100 MG capsule Take 100 mg by mouth 2 (two) times daily.  . famotidine (PEPCID) 20 MG tablet Take 20 mg by mouth daily as needed for heartburn. For GERD   . fentaNYL (DURAGESIC) 25 MCG/HR patch Apply fresh patch every 3 days to help pain. Remove old patch.  . furosemide (LASIX) 20 MG tablet Take 20 mg by mouth daily.   Marland Kitchen levothyroxine (SYNTHROID, LEVOTHROID) 25 MCG tablet Take  25 mcg by mouth daily.  . metFORMIN (GLUCOPHAGE) 500 MG tablet Take 500 mg by mouth 2 (two) times daily.   . nitroGLYCERIN (NITROSTAT) 0.4 MG SL tablet Place 0.4 mg under the tongue every 5 (five) minutes as needed for chest pain.  . Nutritional Supplements (BOOST DIABETIC) LIQD Take 237 mLs by mouth 2 (two) times daily. May also have one at [facility] as needed  . OXYGEN Inhale 1 L/min into the lungs as needed.   Marland Kitchen Propylene Glycol (SYSTANE BALANCE) 0.6 % SOLN Apply to eye. One drop both eyes 2 times a day due to dry eyes  . risperiDONE (RISPERDAL) 0.25 MG tablet Take 0.25 mg by mouth every other day.  . rivastigmine (EXELON) 4.6 mg/24hr Place 4.6 mg onto the skin daily.  Marland Kitchen spironolactone (ALDACTONE) 25 MG tablet Take 25 mg by mouth daily.   Marland Kitchen zinc oxide 20 % ointment Apply 1 application topically. Apply ointment twice daily as needed to buttocks , apply to boney prominences mid spine three times  daily as needed   No facility-administered encounter medications on file as of 07/26/2017.     Review of Systems  Unable to perform ROS: Dementia (from nursing and caregiver)  Constitutional: Positive for appetite change and fatigue. Negative for chills, diaphoresis and fever.  HENT: Negative for congestion, mouth sores and rhinorrhea.   Eyes: Positive for visual disturbance.       Has corrective glasses  Respiratory: Positive for shortness of breath (on chronic oxygen by nasal canula). Negative for cough and wheezing.   Cardiovascular: Negative for chest pain and palpitations.  Gastrointestinal: Negative for abdominal pain, constipation, diarrhea, nausea and vomiting.  Genitourinary: Negative for discharge and hematuria.  Musculoskeletal: Positive for gait problem.       Uses a walker and wheelchair and needs assistance with transfer  Neurological: Negative for tremors, seizures and syncope.  Psychiatric/Behavioral: Positive for behavioral problems and confusion.    Immunization History  Administered  Date(s) Administered  . Influenza Whole 09/23/2010, 09/23/2012  . Influenza,inj,Quad PF,36+ Mos 10/09/2014  . Influenza-Unspecified 09/10/2013, 09/30/2015, 10/05/2016  . PPD Test 05/23/2015  . Pneumococcal Conjugate-13 04/27/2017  . Pneumococcal Polysaccharide-23 03/31/2009  . Td 03/31/2009   Pertinent  Health Maintenance Due  Topic Date Due  . INFLUENZA VACCINE  07/24/2017  . OPHTHALMOLOGY EXAM  12/24/2017 (Originally 06/26/1933)  . HEMOGLOBIN A1C  09/28/2017  . FOOT EXAM  12/28/2017  . URINE MICROALBUMIN  04/30/2018  . PNA vac Low Risk Adult  Completed   Fall Risk  07/26/2017 09/26/2015 05/26/2015 04/29/2015 04/04/2015  Falls in the past year? _0   Number falls in past yr: _1 Injury with Fall? No No Yes Yes Yes  Risk Factor Category  - - High Fall Risk - High Fall Risk  Risk for fall due to : - Impaired balance/gait History of fall(s);Impaired balance/gait;Impaired mobility - History of fall(s);Impaired balance/gait;Impaired mobility  Follow up - Falls evaluation completed Falls evaluation completed - Falls evaluation completed   Functional Status Survey:    Vitals:   07/26/17 1506  BP: 118/70  Pulse: 84  Resp: 16  Temp: 99.3 F (37.4 C)  TempSrc: Oral  SpO2: 94%  Weight: 150 lb (68 kg)  Height: _2  (1.753 m)   Body mass index is 22.15 kg/m. Physical Exam  Constitutional: He appears well-developed and well-nourished. No distress.  HENT:  Head: Normocephalic and atraumatic.  Has hearing aid  Eyes: Pupils are equal, round, and reactive to light. EOM are normal.  Neck: Normal range of motion. No thyromegaly present.  Cardiovascular: Normal rate and regular rhythm.   Murmur heard. Pulmonary/Chest: Effort normal and breath sounds normal. No respiratory distress. He has no wheezes.  Abdominal: Soft. Bowel sounds are normal. He exhibits no distension. There is no tenderness. There is no guarding.  Genitourinary: Penis normal. No penile tenderness.    Genitourinary Comments: No discharge noted  Musculoskeletal: He exhibits edema.  Can move all 4 extremities  Lymphadenopathy:    He has no cervical adenopathy.  Neurological: He is alert.  Oriented to self only  Skin: Skin is warm and dry. He is not diaphoretic.  Psychiatric:  Minimally interacting today    Labs reviewed:  Recent Labs  11/01/16 03/29/17 04/11/17  NA 139 137 134*  K 4.4 4.0 4.4  BUN 48* 48* 57*  CREATININE 1.2 1.1 1.3    Recent Labs  11/01/16 03/29/17  AST 22 16  ALT 14 8*  ALKPHOS 106 112  Recent Labs  11/01/16 03/29/17  WBC 5.6 6.4  HGB 14.4 13.8  HCT 43 41  PLT 124* 132*   Lab Results  Component Value Date   TSH 4.71 03/29/2017   Lab Results  Component Value Date   HGBA1C 6.7 03/29/2017   Lab Results  Component Value Date   CHOL 132 02/07/2015   HDL 33 (A) 02/07/2015   LDLCALC 76 02/07/2015   LDLDIRECT 85.6 07/19/2010   TRIG 115 02/07/2015   CHOLHDL 4 03/06/2011    Significant Diagnostic Results in last 30 days:  No results found.  Assessment/Plan  Somnolence His dementia could be contributing to this but it is most likely side effect of rivastigmine patch. D/c rivastigmine for now. Reviewed care plan with his daughter Estill Batten over the phone and she agrees. Send cbc with diff and cmp next lab.  Dementia with behavioral disturbance Lab Results  Component Value Date   TSH 4.71 03/29/2017   Currently on risperdal 0.25 mg every other day and rivastigmine patch 4.6 mg daily. Discontinue rivastigmine and monitor. Infectious workup as above.  Monitor clinically. Supportive care.   Family/ staff Communication: reviewed care plan with patient, his daughter over the phone and the charge nurse.   Labs/tests ordered:  Cbc with diff, cmp  I have spent greater than 30 minutes in this visit with >50% of the visit time spent in counselling patient's family about care plan, answering their questions. Family agrees with care plan.   Blanchie Serve, MD Internal Medicine [facility] [address] [place],  [address] Cell Phone (Monday-Friday 8 am - 5 pm): [phone] On Call: [phone] and follow prompts after 5 pm and on weekends Office Phone: [phone] Office Fax: [phone]

## 2017-07-26 NOTE — Patient Instructions (Signed)
Zachary Harris , Thank you for taking time to come for your Medicare Wellness Visit. I appreciate your ongoing commitment to your health goals. Please review the following plan we discussed and let me know if I can assist you in the future.   Screening recommendations/referrals: Colonoscopy excluded, pt over age 81 Recommended yearly ophthalmology/optometry visit for glaucoma screening and checkup Recommended yearly dental visit for hygiene and checkup  Vaccinations: Influenza vaccine due 2018 fall season Pneumococcal vaccine up to date Tdap vaccine due 04/01/19 Shingles vaccine not in records  Advanced directives: In Chart  Conditions/risks identified: None  Next appointment: Dr. Bubba Camp makes rounds  Preventive Care 69 Years and Older, Male Preventive care refers to lifestyle choices and visits with your health care provider that can promote health and wellness. What does preventive care include?  A yearly physical exam. This is also called an annual well check.  Dental exams once or twice a year.  Routine eye exams. Ask your health care provider how often you should have your eyes checked.  Personal lifestyle choices, including:  Daily care of your teeth and gums.  Regular physical activity.  Eating a healthy diet.  Avoiding tobacco and drug use.  Limiting alcohol use.  Practicing safe sex.  Taking low doses of aspirin every day.  Taking vitamin and mineral supplements as recommended by your health care provider. What happens during an annual well check? The services and screenings done by your health care provider during your annual well check will depend on your age, overall health, lifestyle risk factors, and family history of disease. Counseling  Your health care provider may ask you questions about your:  Alcohol use.  Tobacco use.  Drug use.  Emotional well-being.  Home and relationship well-being.  Sexual activity.  Eating habits.  History of  falls.  Memory and ability to understand (cognition).  Work and work Statistician. Screening  You may have the following tests or measurements:  Height, weight, and BMI.  Blood pressure.  Lipid and cholesterol levels. These may be checked every 5 years, or more frequently if you are over 68 years old.  Skin check.  Lung cancer screening. You may have this screening every year starting at age 86 if you have a 30-pack-year history of smoking and currently smoke or have quit within the past 15 years.  Fecal occult blood test (FOBT) of the stool. You may have this test every year starting at age 50.  Flexible sigmoidoscopy or colonoscopy. You may have a sigmoidoscopy every 5 years or a colonoscopy every 10 years starting at age 93.  Prostate cancer screening. Recommendations will vary depending on your family history and other risks.  Hepatitis C blood test.  Hepatitis B blood test.  Sexually transmitted disease (STD) testing.  Diabetes screening. This is done by checking your blood sugar (glucose) after you have not eaten for a while (fasting). You may have this done every 1-3 years.  Abdominal aortic aneurysm (AAA) screening. You may need this if you are a current or former smoker.  Osteoporosis. You may be screened starting at age 75 if you are at high risk. Talk with your health care provider about your test results, treatment options, and if necessary, the need for more tests. Vaccines  Your health care provider may recommend certain vaccines, such as:  Influenza vaccine. This is recommended every year.  Tetanus, diphtheria, and acellular pertussis (Tdap, Td) vaccine. You may need a Td booster every 10 years.  Zoster vaccine. You  may need this after age 58.  Pneumococcal 13-valent conjugate (PCV13) vaccine. One dose is recommended after age 65.  Pneumococcal polysaccharide (PPSV23) vaccine. One dose is recommended after age 65. Talk to your health care provider about  which screenings and vaccines you need and how often you need them. This information is not intended to replace advice given to you by your health care provider. Make sure you discuss any questions you have with your health care provider. Document Released: 01/06/2016 Document Revised: 08/29/2016 Document Reviewed: 10/11/2015 Elsevier Interactive Patient Education  2017 [place] Prevention in the Home Falls can cause injuries. They can happen to people of all ages. There are many things you can do to make your home safe and to help prevent falls. What can I do on the outside of my home?  Regularly fix the edges of walkways and driveways and fix any cracks.  Remove anything that might make you trip as you walk through a door, such as a raised step or threshold.  Trim any bushes or trees on the path to your home.  Use bright outdoor lighting.  Clear any walking paths of anything that might make someone trip, such as rocks or tools.  Regularly check to see if handrails are loose or broken. Make sure that both sides of any steps have handrails.  Any raised decks and porches should have guardrails on the edges.  Have any leaves, snow, or ice cleared regularly.  Use sand or salt on walking paths during winter.  Clean up any spills in your garage right away. This includes oil or grease spills. What can I do in the bathroom?  Use night lights.  Install grab bars by the toilet and in the tub and shower. Do not use towel bars as grab bars.  Use non-skid mats or decals in the tub or shower.  If you need to sit down in the shower, use a plastic, non-slip stool.  Keep the floor dry. Clean up any water that spills on the floor as soon as it happens.  Remove soap buildup in the tub or shower regularly.  Attach bath mats securely with double-sided non-slip rug tape.  Do not have throw rugs and other things on the floor that can make you trip. What can I do in the  bedroom?  Use night lights.  Make sure that you have a light by your bed that is easy to reach.  Do not use any sheets or blankets that are too big for your bed. They should not hang down onto the floor.  Have a firm chair that has side arms. You can use this for support while you get dressed.  Do not have throw rugs and other things on the floor that can make you trip. What can I do in the kitchen?  Clean up any spills right away.  Avoid walking on wet floors.  Keep items that you use a lot in easy-to-reach places.  If you need to reach something above you, use a strong step stool that has a grab bar.  Keep electrical cords out of the way.  Do not use floor polish or wax that makes floors slippery. If you must use wax, use non-skid floor wax.  Do not have throw rugs and other things on the floor that can make you trip. What can I do with my stairs?  Do not leave any items on the stairs.  Make sure that there are handrails on both  sides of the stairs and use them. Fix handrails that are broken or loose. Make sure that handrails are as long as the stairways.  Check any carpeting to make sure that it is firmly attached to the stairs. Fix any carpet that is loose or worn.  Avoid having throw rugs at the top or bottom of the stairs. If you do have throw rugs, attach them to the floor with carpet tape.  Make sure that you have a light switch at the top of the stairs and the bottom of the stairs. If you do not have them, ask someone to add them for you. What else can I do to help prevent falls?  Wear shoes that:  Do not have high heels.  Have rubber bottoms.  Are comfortable and fit you well.  Are closed at the toe. Do not wear sandals.  If you use a stepladder:  Make sure that it is fully opened. Do not climb a closed stepladder.  Make sure that both sides of the stepladder are locked into place.  Ask someone to hold it for you, if possible.  Clearly mark and make  sure that you can see:  Any grab bars or handrails.  First and last steps.  Where the edge of each step is.  Use tools that help you move around (mobility aids) if they are needed. These include:  Canes.  Walkers.  Scooters.  Crutches.  Turn on the lights when you go into a dark area. Replace any light bulbs as soon as they burn out.  Set up your furniture so you have a clear path. Avoid moving your furniture around.  If any of your floors are uneven, fix them.  If there are any pets around you, be aware of where they are.  Review your medicines with your doctor. Some medicines can make you feel dizzy. This can increase your chance of falling. Ask your doctor what other things that you can do to help prevent falls. This information is not intended to replace advice given to you by your health care provider. Make sure you discuss any questions you have with your health care provider. Document Released: 10/06/2009 Document Revised: 05/17/2016 Document Reviewed: 01/14/2015 Elsevier Interactive Patient Education  2017 [employer].

## 2017-07-29 DIAGNOSIS — I10 Essential (primary) hypertension: Secondary | ICD-10-CM | POA: Diagnosis not present

## 2017-07-29 DIAGNOSIS — M6281 Muscle weakness (generalized): Secondary | ICD-10-CM | POA: Diagnosis not present

## 2017-07-29 LAB — BASIC METABOLIC PANEL
BUN: 33 — AB (ref 4–21)
CREATININE: 1.2 (ref 0.6–1.3)
Glucose: 121
Potassium: 3.6 (ref 3.4–5.3)
Sodium: 144 (ref 137–147)

## 2017-07-29 LAB — HEPATIC FUNCTION PANEL
ALK PHOS: 140 — AB (ref 25–125)
ALT: 7 — AB (ref 10–40)
AST: 15 (ref 14–40)
BILIRUBIN, TOTAL: 0.9

## 2017-07-29 LAB — CBC AND DIFFERENTIAL
HEMATOCRIT: 38 — AB (ref 41–53)
HEMOGLOBIN: 12.5 — AB (ref 13.5–17.5)
PLATELETS: 162 (ref 150–399)
WBC: 6

## 2017-08-01 ENCOUNTER — Encounter: Payer: Self-pay | Admitting: Family

## 2017-08-01 ENCOUNTER — Non-Acute Institutional Stay (SKILLED_NURSING_FACILITY): Payer: Medicare Other | Admitting: Family

## 2017-08-01 DIAGNOSIS — G309 Alzheimer's disease, unspecified: Secondary | ICD-10-CM | POA: Diagnosis not present

## 2017-08-01 DIAGNOSIS — E1142 Type 2 diabetes mellitus with diabetic polyneuropathy: Secondary | ICD-10-CM

## 2017-08-01 DIAGNOSIS — E039 Hypothyroidism, unspecified: Secondary | ICD-10-CM | POA: Diagnosis not present

## 2017-08-01 DIAGNOSIS — F0281 Dementia in other diseases classified elsewhere with behavioral disturbance: Secondary | ICD-10-CM

## 2017-08-01 DIAGNOSIS — R531 Weakness: Secondary | ICD-10-CM

## 2017-08-01 MED ORDER — METFORMIN HCL 500 MG PO TABS: 500.0000 mg | ORAL_TABLET | Freq: Every day | ORAL | 3 refills | Status: DC

## 2017-08-01 NOTE — Progress Notes (Signed)
Location:  [facility] Room Number: 9 Place of Service:  SNF ([phone]) Provider: Haleigh Desmith FNP-C   Blanchie Serve, MD  Patient Care Team: Blanchie Serve, MD as PCP - General (Internal Medicine) Irine Seal, MD as Consulting Physician (Urology) Verl Blalock, Marijo Conception, MD (Inactive) as Consulting Physician (Cardiology) [facility] Crenshaw, Denice Bors, MD as Consulting Physician (Cardiology) Rigoberto Noel, MD as Consulting Physician (Pulmonary Disease) Ciaran Begay, Nelda Bucks, NP as Nurse Practitioner (Family Medicine)  Extended Emergency Contact Information Primary Emergency Contact: Kathline Magic, [place] of [place] Phone: [phone] Relation: Daughter Secondary Emergency Contact: Johnnette Gourd [place] of [place] Phone: [phone] Mobile Phone: [phone] Relation: Son  Code Status:  DNR Goals of care: Advanced Directive information Advanced Directives 08/01/2017  Does Patient Have a Medical Advance Directive? Yes  Type of Advance Directive Out of facility DNR (pink MOST or yellow form);Living will;Healthcare Power of Attorney  Does patient want to make changes to medical advance directive? -  Copy of [place] in Chart? Yes  Pre-existing out of facility DNR order (yellow form or pink MOST form) Pink MOST form placed in chart (order not valid for inpatient use);Yellow form placed in chart (order not valid for inpatient use)     Chief Complaint  Patient presents with  . Medical Management of Chronic Issues    discuss DM meds due to normal A1c and kidney function    HPI:  Pt is a 83 y.o. male seen today [place] for medical management of chronic diseases.He has a medical history of HTN, CAD,CHF, MI, Afib, PVD, Type 2 DM with Neuropathy, CKD, Anxiety, Prostate Cancer among other conditions. He seen in his room today with daughter and son in law present.He is asleep in bed unable to  participate in ROS/HPI. Patient's daughter states patient has had generalized weakness, lethargic and not eating well. He occasionally drinks his protein supplements. Per daughter he is " stubborn" whenever they try to assist him with feeding.His recent Hgb A1C was 5.5 ( 07/04/2017).He was recently seen by Onsite dermatology for left flank area inflamed seborrheic keratosis no treatment required, benign growth. Follow up with dermatology in one year.          Past Medical History:  Diagnosis Date  . ANEMIA-IRON DEFICIENCY 05/27/2007  . ANXIETY 11/14/2007  . BENIGN PROSTATIC HYPERTROPHY 11/14/2007  . CAROTID ARTERY DISEASE 10/02/2010  . Chronic combined systolic and diastolic CHF (congestive heart failure) ([place]) 10/10/2014  . CKD stage 3 due to type 1 diabetes mellitus ([place]) 03/17/2015  . COLONIC POLYPS, HX OF 05/27/2007  . CORONARY ARTERY DISEASE 11/14/2007   prior bypass [phone]; Inferior MI 11/07; Severe 3 vessel disease; occluded SVG to RCA; SVG to OM subtotalled, SVG to diagonal with ostial 70 and 80 mid; EF 45. PCI of SVG to OM. FU myovue showed EF 56 with inferior infarct and very mild peri-infarct ischemia  . DEPRESSION 11/14/2007  . DIABETES MELLITUS, TYPE II 11/14/2007  . DIVERTICULITIS, HX OF 05/27/2007  . DM (diabetes mellitus), type 2 with renal complications ([place]) 3/00/7622  . DVT (deep venous thrombosis) ([place]) 03/21/2011  . Dyspnea 11/09/2014  . Effusion of left knee 03/17/2015  . Failure to thrive in adult 05/03/2015  . FOOT PAIN, BILATERAL 12/08/2010  . Generalized weakness 05/20/2015  . GERD 11/14/2007  . Hearing loss 05/13/2009   Qualifier: Diagnosis of  By: Jenny Reichmann MD, Jeneen Rinks  W   . Hyperlipemia 11/14/2007   Qualifier: Diagnosis of  By: Jenny Reichmann MD, Hunt Oris    . HYPERLIPIDEMIA 11/14/2007  . HYPERTENSION 11/14/2007  . HYPOTENSION, ORTHOSTATIC 03/06/2011  . Intermittent high-grade heart block 09/05/2009   s/p pacemaker  . Ischemic cardiomyopathy    a. echo (10/15):  mod focal basal  septal hypertrophy, EF 40-45%, inf AK, Gr 1 DD, Al sclerosis, mild AI, MAC, mild MR, mod LAE, lipomatous hypertrophy, small effusion (no hemodynamic compromise)   . Memory loss 06/01/2008  . Myalgia and myositis, unspecified 04/24/2011  . Osteoarthrosis, unspecified whether generalized or localized, unspecified site   . OSTEOPOROSIS 06/02/2010  . Other malaise and fatigue 04/24/2011  . Pacemaker-St. Jude 02/02/2008   St. Jude  . Peripheral neuropathy 03/27/2011  . PERIPHERAL VASCULAR DISEASE 11/14/2007  . Prostate cancer ([place])   . Protein-calorie malnutrition, severe ([place]) 05/23/2015  . RENAL INSUFFICIENCY 11/14/2007  . Seborrheic keratosis 06/05/2011  . SECONDARY DM W/PERIPHERAL CIRC D/O UNCONTROLLED 11/07/2010  . Severe back pain   . SYNCOPE 09/08/2008  . Unspecified hearing loss 05/13/2009  . Vertebral compression fracture ([place]) 09/05/2009   Qualifier: Diagnosis of  By: Jenny Reichmann MD, Hunt Oris   . [name] 04/04/2015  . Weight loss 10/10/2014   Past Surgical History:  Procedure Laterality Date  . APPENDECTOMY    . CHOLECYSTECTOMY    . CORONARY ARTERY BYPASS GRAFT  2007  . CYSTOSCOPY    . PACEMAKER INSERTION  2012   Caryl Comes, MD  . prostate needle biopsy    . s/p bilat knee replacement  1992   Paul, MD  . s/p coronary stent x 1      Allergies  Allergen Reactions  . Hydrocodone     hallucination  . Tramadol     hallucination    Allergies as of 08/01/2017      Reactions   Hydrocodone    hallucination   Tramadol    hallucination      Medication List       Accurate as of 08/01/17 11:55 AM. Always use your most recent med list.          BOOST DIABETIC Liqd Take 237 mLs by mouth 2 (two) times daily. May also [facility] one at [facility] as needed   carbamide peroxide 6.5 % OTIC solution Commonly known as:  DEBROX 5 drops. Instil 5 - 10 drops in each ear at bedtime 3 times a week every other month   docusate sodium 100 MG capsule Commonly known as:  COLACE Take 100 mg by mouth 2 (two) times daily.    famotidine 20 MG tablet Commonly known as:  PEPCID Take 20 mg by mouth daily as needed for heartburn. For GERD   feeding supplement (PRO-STAT SUGAR FREE 64) Liqd Take 30 mLs by mouth 3 (three) times daily with meals.   fentaNYL 25 MCG/HR patch Commonly known as:  DURAGESIC Apply fresh patch every 3 days to help pain. Remove old patch.   furosemide 20 MG tablet Commonly known as:  LASIX Take 20 mg by mouth daily.   levothyroxine 25 MCG tablet Commonly known as:  SYNTHROID, LEVOTHROID Take 25 mcg by mouth daily.   metFORMIN 500 MG tablet Commonly known as:  GLUCOPHAGE Take 500 mg by mouth 2 (two) times daily.   nitroGLYCERIN 0.4 MG SL tablet Commonly known as:  NITROSTAT Place 0.4 mg under the tongue every 5 (five) minutes as needed for chest pain.   OXYGEN Inhale 1 L/min into the lungs  as needed.   risperiDONE 0.25 MG tablet Commonly known as:  RISPERDAL Take 0.25 mg by mouth every other day.   spironolactone 25 MG tablet Commonly known as:  ALDACTONE Take 25 mg by mouth daily.   SYSTANE BALANCE 0.6 % Soln Generic drug:  Propylene Glycol Apply to eye. One drop both eyes 2 times a day due to dry eyes   zinc oxide 20 % ointment Apply 1 application topically. Apply ointment twice daily as needed to buttocks , apply to boney prominences mid spine three times daily as needed       Review of Systems  Unable to perform ROS: Dementia    Immunization History  Administered Date(s) Administered  . Influenza Whole 09/23/2010, 09/23/2012  . Influenza,inj,Quad PF,36+ Mos 10/09/2014  . Influenza-Unspecified 09/10/2013, 09/30/2015, 10/05/2016  . PPD Test 05/23/2015  . Pneumococcal Conjugate-13 04/27/2017  . Pneumococcal Polysaccharide-23 03/31/2009  . Td 03/31/2009   Pertinent  Health Maintenance Due  Topic Date Due  . INFLUENZA VACCINE  07/24/2017  . HEMOGLOBIN A1C  09/28/2017  . FOOT EXAM  12/28/2017  . URINE MICROALBUMIN  04/30/2018  . OPHTHALMOLOGY EXAM   06/12/2018  . PNA vac Low Risk Adult  Completed   Fall Risk  07/26/2017 09/26/2015 05/26/2015 04/29/2015 04/04/2015  Falls in the past year? _0   Number falls in past yr: _1 Injury with Fall? No No Yes Yes Yes  Risk Factor Category  - - High Fall Risk - High Fall Risk  Risk for fall due to : - Impaired balance/gait History of fall(s);Impaired balance/gait;Impaired mobility - History of fall(s);Impaired balance/gait;Impaired mobility  Follow up - Falls evaluation completed Falls evaluation completed - Falls evaluation completed    Vitals:   08/01/17 1101  BP: (!) 119/91  Pulse: 82  Resp: 20  Temp: (!) 97.2 F (36.2 C)  SpO2: 95%  Weight: 145 lb (65.8 kg)  Height: _2  (1.753 m)   Body mass index is 21.41 kg/m. Physical Exam  Constitutional: He appears well-developed and well-nourished. No distress.  Response verbally but falls back to sleep  HENT:  Head: Normocephalic.  Mouth/Throat: Oropharynx is clear and moist. No oropharyngeal exudate.  Eyes: Pupils are equal, round, and reactive to light. Conjunctivae and EOM are normal. Right eye exhibits no discharge. Left eye exhibits no discharge. No scleral icterus.  Neck: Normal range of motion. No JVD present. No thyromegaly present.  Cardiovascular: Intact distal pulses.  Exam reveals no gallop and no friction rub.   Murmur heard. Pace maker   Pulmonary/Chest: Effort normal and breath sounds normal. No respiratory distress. He has no wheezes. He has no rales.  Abdominal: Soft. Bowel sounds are normal. He exhibits no distension. There is no tenderness. There is no rebound and no guarding.  Genitourinary:  Genitourinary Comments: Incontinent   Musculoskeletal: He exhibits no tenderness or deformity.  Generalized weakness. Bilateral lower extremities 2+ edema.   Lymphadenopathy:    He has no cervical adenopathy.  Neurological:  Response verbally but falls back to sleep.  Skin: Skin is warm and dry. No rash  noted. No erythema. No pallor.  Psychiatric: He has a normal mood and affect.    Labs reviewed:  Recent Labs  11/01/16 03/29/17 04/11/17  NA 139 137 134*  K 4.4 4.0 4.4  BUN 48* 48* 57*  CREATININE 1.2 1.1 1.3    Recent Labs  11/01/16 03/29/17  AST 22 16  ALT 14 8*  ALKPHOS  106 112    Recent Labs  11/01/16 03/29/17  WBC 5.6 6.4  HGB 14.4 13.8  HCT 43 41  PLT 124* 132*   Lab Results  Component Value Date   TSH 4.71 03/29/2017   Lab Results  Component Value Date   HGBA1C 6.7 03/29/2017   Lab Results  Component Value Date   CHOL 132 02/07/2015   HDL 33 (A) 02/07/2015   LDLCALC 76 02/07/2015   LDLDIRECT 85.6 07/19/2010   TRIG 115 02/07/2015   CHOLHDL 4 03/06/2011    Significant Diagnostic Results in last 30 days:  No results found.  Assessment/Plan  1. DM type 2 with diabetic peripheral neuropathy CBG's ranging in the 100's-140's . Recent Hgb A1C 5.5 ( 07/04/2017). Will reduce metformin to 250 mg Tablet twice daily. Continue to monitor CBG's and further reduce metformin to prevent hypoglycemia. Repeat Hgb A1c in 3 months.   2. Generalized weakness His poor oral intake could be contributing to weakness.Facility staff to assist with feeding all meals. Encourage fluid intake. Check CBC/diff and CMP  08/05/2017 to rule out infectious and metabolic etiologies.   3. Hypothyroidism Lab Results  Component Value Date   TSH 4.71 03/29/2017  Continue on levothyroxine 25 mcg tablet daily.check TSH level 08/05/2017  4. Alzheimer's dementia with behavioral disturbance Has had poor oral intake and sleeps most of the time.Progressive decline expected. Facility staff to assist with feeding all meals.   Family/ staff Communication: Reviewed plan of care with patient, patient's daughter and facility Nurse.   Labs/tests ordered: CBC/diff, CMP and TSH level 08/05/2017.   Sandrea Hughs, NP

## 2017-08-05 DIAGNOSIS — I10 Essential (primary) hypertension: Secondary | ICD-10-CM | POA: Diagnosis not present

## 2017-08-05 DIAGNOSIS — E118 Type 2 diabetes mellitus with unspecified complications: Secondary | ICD-10-CM | POA: Diagnosis not present

## 2017-08-05 LAB — HEPATIC FUNCTION PANEL
ALK PHOS: 141 — AB (ref 25–125)
ALT: 8 — AB (ref 10–40)
AST: 16 (ref 14–40)
Bilirubin, Total: 0.8

## 2017-08-05 LAB — BASIC METABOLIC PANEL
BUN: 28 — AB (ref 4–21)
CREATININE: 0.8 (ref 0.6–1.3)
Glucose: 100
POTASSIUM: 3.8 (ref 3.4–5.3)
SODIUM: 138 (ref 137–147)

## 2017-08-05 LAB — CBC AND DIFFERENTIAL
HCT: 36 — AB (ref 41–53)
Hemoglobin: 11.9 — AB (ref 13.5–17.5)
Platelets: 175 (ref 150–399)
WBC: 5.4

## 2017-08-05 LAB — TSH: TSH: 2.96 (ref 0.41–5.90)

## 2017-08-30 ENCOUNTER — Encounter: Payer: Self-pay | Admitting: Internal Medicine

## 2017-08-30 ENCOUNTER — Non-Acute Institutional Stay (SKILLED_NURSING_FACILITY): Payer: Medicare Other | Admitting: Internal Medicine

## 2017-08-30 DIAGNOSIS — E039 Hypothyroidism, unspecified: Secondary | ICD-10-CM

## 2017-08-30 DIAGNOSIS — E1121 Type 2 diabetes mellitus with diabetic nephropathy: Secondary | ICD-10-CM | POA: Diagnosis not present

## 2017-08-30 DIAGNOSIS — R627 Adult failure to thrive: Secondary | ICD-10-CM

## 2017-08-30 DIAGNOSIS — K219 Gastro-esophageal reflux disease without esophagitis: Secondary | ICD-10-CM | POA: Diagnosis not present

## 2017-08-30 DIAGNOSIS — K5901 Slow transit constipation: Secondary | ICD-10-CM

## 2017-08-30 DIAGNOSIS — I2510 Atherosclerotic heart disease of native coronary artery without angina pectoris: Secondary | ICD-10-CM

## 2017-08-30 DIAGNOSIS — N183 Chronic kidney disease, stage 3 unspecified: Secondary | ICD-10-CM

## 2017-08-30 DIAGNOSIS — R630 Anorexia: Secondary | ICD-10-CM

## 2017-08-30 DIAGNOSIS — F0390 Unspecified dementia without behavioral disturbance: Secondary | ICD-10-CM

## 2017-08-30 NOTE — Progress Notes (Signed)
Location:  [facility] Room Number: 9 Place of Service:  SNF [phone]) Provider:  Blanchie Serve MD  Blanchie Serve, MD  Patient Care Team: Blanchie Serve, MD as PCP - General (Internal Medicine) Irine Seal, MD as Consulting Physician (Urology) Verl Blalock, Marijo Conception, MD (Inactive) as Consulting Physician (Cardiology) [facility] C[facility], Denice Bors, MD as Consulting Physician (Cardiology) Rigoberto Noel, MD as Consulting Physician (Pulmonary Disease) Ngetich, Nelda Bucks, NP as Nurse Practitioner (Family Medicine)  Extended Emergency Contact Information Primary Emergency Contact: Kathline Magic, [place] of [place] Phone: [phone] Relation: Daughter Secondary Emergency Contact: Johnnette Gourd [place] of [place] Phone: [phone] Mobile Phone: [phone] Relation: Son  Code Status:  DNR  Goals of care: Advanced Directive information Advanced Directives 08/30/2017  Does Patient Have a Medical Advance Directive? Yes  Type of Advance Directive Out of facility DNR (pink MOST or yellow form);Living will  Does patient want to make changes to medical advance directive? No - Patient declined  Copy of [place] in Chart? -  Pre-existing out of facility DNR order (yellow form or pink MOST form) Yellow form placed in chart (order not valid for inpatient use)     Chief Complaint  Patient presents with  . Medical Management of Chronic Issues    Routine Visit     HPI:  Pt is a 83 y.o. male seen today for medical management of chronic diseases.  He has poor oral intake and is losing weight. He has eaten minimally for last 2 days. He is mostly in bed now. He participates some in HPI and ROS today. He mentions being tired and wanting to rest. He needs 2 person assist with transfer now.    Past Medical History:  Diagnosis Date  . ANEMIA-IRON DEFICIENCY 05/27/2007  . ANXIETY 11/14/2007  . BENIGN PROSTATIC  HYPERTROPHY 11/14/2007  . CAROTID ARTERY DISEASE 10/02/2010  . Chronic combined systolic and diastolic CHF (congestive heart failure) ([place]) 10/10/2014  . CKD stage 3 due to type 1 diabetes mellitus ([place]) 03/17/2015  . COLONIC POLYPS, HX OF 05/27/2007  . CORONARY ARTERY DISEASE 11/14/2007   prior bypass [phone]; Inferior MI 11/07; Severe 3 vessel disease; occluded SVG to RCA; SVG to OM subtotalled, SVG to diagonal with ostial 70 and 80 mid; EF 45. PCI of SVG to OM. FU myovue showed EF 56 with inferior infarct and very mild peri-infarct ischemia  . DEPRESSION 11/14/2007  . DIABETES MELLITUS, TYPE II 11/14/2007  . DIVERTICULITIS, HX OF 05/27/2007  . DM (diabetes mellitus), type 2 with renal complications ([place]) 07/26/2121  . DVT (deep venous thrombosis) ([place]) 03/21/2011  . Dyspnea 11/09/2014  . Effusion of left knee 03/17/2015  . Failure to thrive in adult 05/03/2015  . FOOT PAIN, BILATERAL 12/08/2010  . Generalized weakness 05/20/2015  . GERD 11/14/2007  . Hearing loss 05/13/2009   Qualifier: Diagnosis of  By: Jenny Reichmann MD, Hunt Oris   . Hyperlipemia 11/14/2007   Qualifier: Diagnosis of  By: Jenny Reichmann MD, Hunt Oris    . HYPERLIPIDEMIA 11/14/2007  . HYPERTENSION 11/14/2007  . HYPOTENSION, ORTHOSTATIC 03/06/2011  . Intermittent high-grade heart block 09/05/2009   s/p pacemaker  . Ischemic cardiomyopathy    a. echo (10/15):  mod focal basal septal hypertrophy, EF 40-45%, inf AK, Gr 1 DD, Al sclerosis, mild AI, MAC, mild MR, mod LAE, lipomatous hypertrophy, small effusion (no hemodynamic compromise)   . Memory loss  06/01/2008  . Myalgia and myositis, unspecified 04/24/2011  . Osteoarthrosis, unspecified whether generalized or localized, unspecified site   . OSTEOPOROSIS 06/02/2010  . Other malaise and fatigue 04/24/2011  . Pacemaker-St. Jude 02/02/2008   St. Jude  . Peripheral neuropathy 03/27/2011  . PERIPHERAL VASCULAR DISEASE 11/14/2007  . Prostate cancer ([place])   . Protein-calorie malnutrition, severe ([place])  05/23/2015  . RENAL INSUFFICIENCY 11/14/2007  . Seborrheic keratosis 06/05/2011  . SECONDARY DM W/PERIPHERAL CIRC D/O UNCONTROLLED 11/07/2010  . Severe back pain   . SYNCOPE 09/08/2008  . Unspecified hearing loss 05/13/2009  . Vertebral compression fracture ([place]) 09/05/2009   Qualifier: Diagnosis of  By: Jenny Reichmann MD, Hunt Oris   . [name] 04/04/2015  . Weight loss 10/10/2014   Past Surgical History:  Procedure Laterality Date  . APPENDECTOMY    . CHOLECYSTECTOMY    . CORONARY ARTERY BYPASS GRAFT  2007  . CYSTOSCOPY    . PACEMAKER INSERTION  2012   Caryl Comes, MD  . prostate needle biopsy    . s/p bilat knee replacement  1992   Paul, MD  . s/p coronary stent x 1      Allergies  Allergen Reactions  . Hydrocodone     hallucination  . Tramadol     hallucination    Outpatient Encounter Prescriptions as of 08/30/2017  Medication Sig  . Amino Acids-Protein Hydrolys (FEEDING SUPPLEMENT, PRO-STAT SUGAR FREE 64,) LIQD Take 30 mLs by mouth 3 (three) times daily with meals.   . carbamide peroxide (DEBROX) 6.5 % otic solution 5 drops. Instil 5 - 10 drops in each ear at bedtime 3 times a week every other month  . docusate sodium (COLACE) 100 MG capsule Take 100 mg by mouth 2 (two) times daily.  . famotidine (PEPCID) 20 MG tablet Take 20 mg by mouth daily as needed for heartburn. For GERD   . fentaNYL (DURAGESIC) 25 MCG/HR patch Apply fresh patch every 3 days to help pain. Remove old patch.  . furosemide (LASIX) 20 MG tablet Take 20 mg by mouth daily.   Marland Kitchen levothyroxine (SYNTHROID, LEVOTHROID) 25 MCG tablet Take 25 mcg by mouth daily.  . metFORMIN (GLUCOPHAGE) 500 MG tablet Take 250 mg by mouth 2 (two) times daily with a meal.  . nitroGLYCERIN (NITROSTAT) 0.4 MG SL tablet Place 0.4 mg under the tongue every 5 (five) minutes as needed for chest pain.  . Nutritional Supplements (BOOST DIABETIC) LIQD Take 237 mLs by mouth 2 (two) times daily. May also have one at [facility] as needed  . OXYGEN Inhale 1 L/min into the  lungs as needed.   Marland Kitchen Propylene Glycol (SYSTANE BALANCE) 0.6 % SOLN Apply to eye. One drop both eyes 2 times a day due to dry eyes  . risperiDONE (RISPERDAL) 0.25 MG tablet Take 0.25 mg by mouth every other day.  . spironolactone (ALDACTONE) 25 MG tablet Take 25 mg by mouth daily.   Marland Kitchen zinc oxide 20 % ointment Apply 1 application topically. Apply ointment twice daily as needed to buttocks , apply to boney prominences mid spine three times daily as needed  . [DISCONTINUED] metFORMIN (GLUCOPHAGE) 500 MG tablet Take 1 tablet (500 mg total) by mouth daily with breakfast. Take 250 mg Tablet twice daily   No facility-administered encounter medications on file as of 08/30/2017.     Review of Systems  Unable to perform ROS: Dementia (dementia and deconditioning)  Constitutional: Positive for appetite change and fatigue. Negative for fever.  HENT: Negative for sore throat  and trouble swallowing.   Eyes:       Wears corrective glasses  Respiratory: Positive for shortness of breath.        On oxygen by nasal canula  Cardiovascular: Positive for leg swelling. Negative for chest pain.  Gastrointestinal: Negative for abdominal pain, diarrhea and vomiting.  Genitourinary:       Has urinary incontinence  Musculoskeletal: Positive for gait problem.       Uses walker and wheelchair  Neurological: Positive for weakness. Negative for syncope.  Psychiatric/Behavioral: Positive for confusion.    Immunization History  Administered Date(s) Administered  . Influenza Whole 09/23/2010, 09/23/2012  . Influenza,inj,Quad PF,6+ Mos 10/09/2014  . Influenza-Unspecified 09/10/2013, 09/30/2015, 10/05/2016  . PPD Test 05/23/2015  . Pneumococcal Conjugate-13 04/27/2017  . Pneumococcal Polysaccharide-23 03/31/2009  . Td 03/31/2009   Pertinent  Health Maintenance Due  Topic Date Due  . INFLUENZA VACCINE  09/23/2017 (Originally 07/24/2017)  . HEMOGLOBIN A1C  09/28/2017  . FOOT EXAM  12/28/2017  . URINE MICROALBUMIN   04/30/2018  . OPHTHALMOLOGY EXAM  06/12/2018  . PNA vac Low Risk Adult  Completed   Fall Risk  07/26/2017 09/26/2015 05/26/2015 04/29/2015 04/04/2015  Falls in the past year? _0   Number falls in past yr: _1 Injury with Fall? No No Yes Yes Yes  Risk Factor Category  - - High Fall Risk - High Fall Risk  Risk for fall due to : - Impaired balance/gait History of fall(s);Impaired balance/gait;Impaired mobility - History of fall(s);Impaired balance/gait;Impaired mobility  Follow up - Falls evaluation completed Falls evaluation completed - Falls evaluation completed   Functional Status Survey:    Vitals:   08/30/17 1113  BP: 140/77  Pulse: 90  Resp: 20  Temp: 99 F (37.2 C)  TempSrc: Oral  SpO2: 97%  Weight: 142 lb 3.2 oz (64.5 kg)  Height: _2  (1.753 m)   Body mass index is 21 kg/m.   Wt Readings from Last 3 Encounters:  08/30/17 142 lb 3.2 oz (64.5 kg)  08/01/17 145 lb (65.8 kg)  07/26/17 150 lb (68 kg)    Physical Exam  Constitutional:  Elderly frail male in no acute distress  HENT:  Head: Normocephalic and atraumatic.  Dry mucus membrane  Eyes: Pupils are equal, round, and reactive to light. Conjunctivae are normal.  Right upper eyelid stye present with mild erythema, no signs of infection, normal sclera and conjunctiva  Neck: Neck supple. No JVD present.  Cardiovascular: Normal rate and regular rhythm.   Murmur heard. Pulmonary/Chest: Effort normal and breath sounds normal.  Left chest wall pacemaker present  Abdominal: Soft. Bowel sounds are normal. There is no tenderness.  Musculoskeletal: He exhibits edema.  Generalized weakness.Trace leg edema, can move all 4 extremities  Lymphadenopathy:    He has no cervical adenopathy.  Neurological: He is alert.  Oriented to self only. Appears lethargic. Following simple commands  Skin: Skin is warm and dry. He is not diaphoretic.    Labs reviewed:  Recent Labs  11/01/16 03/29/17 04/11/17  NA 139  137 134*  K 4.4 4.0 4.4  BUN 48* 48* 57*  CREATININE 1.2 1.1 1.3    Recent Labs  11/01/16 03/29/17  AST 22 16  ALT 14 8*  ALKPHOS 106 112    Recent Labs  11/01/16 03/29/17  WBC 5.6 6.4  HGB 14.4 13.8  HCT 43 41  PLT 124* 132*   Lab Results  Component Value Date  TSH 4.71 03/29/2017   Lab Results  Component Value Date   HGBA1C 6.7 03/29/2017   Lab Results  Component Value Date   CHOL 132 02/07/2015   HDL 33 (A) 02/07/2015   LDLCALC 76 02/07/2015   LDLDIRECT 85.6 07/19/2010   TRIG 115 02/07/2015   CHOLHDL 4 03/06/2011    Significant Diagnostic Results in last 30 days:  No results found.  Assessment/Plan  Poor oral intake With failure to thrive with his dementia and protein calorie malnutrition. Continue nutritional supplement and encourage po intake. Hospice consult for comfort care per son  Failure to thrive In bed mostly, not eating, minimal participation in conversation. Has medical co-morbidities. Family has decided on comfort care, hospice referral placed.  Type 2 diabetes mellitus with renal impairment Lab Results  Component Value Date   HGBA1C 6.7 03/29/2017   Currently on metformin 250 mg bid. He is not eating. a1c at goal. Discontinue metformin.  Hypothyroidism Lab Results  Component Value Date   TSH 4.71 03/29/2017   On levothyroxine 25 mcg daily, continue this if pt agrees to take it  Constipation Currently on colace 100 mg bid, d/c this and add dulcolax suppository q 3 days prn and monitor  Dementia with behavioral disturbance Supportive care, continue risperidone 0.25 mg qod and monitor  ckd stage 3 Monitor his urine output for now.   gerd With his poor po intake, d/c famotidine prn order  CAD Chest pain free. Continue prn NTG and daily o2 by nasal canula.  Chronic combined CHF Appears euvolemic. Currently on furosemide 20 mg daily and spironolactone 25 mg daily. He has been losing weight. He is not eating. D/c furosemide and  spironolactone. Place lasix 10 mg daily prn only if has fluid overload.     Family/ staff Communication: reviewed care plan with patient's son over the phone and charge nurse.    Labs/tests ordered:  none  Blanchie Serve, MD Internal Medicine [facility] [address] [place], [place] [address] Cell Phone (Monday-Friday 8 am - 5 pm): [phone] On Call: [phone] and follow prompts after 5 pm and on weekends Office Phone: [phone] Office Fax: [phone]

## 2017-09-02 ENCOUNTER — Ambulatory Visit (INDEPENDENT_AMBULATORY_CARE_PROVIDER_SITE_OTHER): Admitting: *Deleted

## 2017-09-02 DIAGNOSIS — I442 Atrioventricular block, complete: Secondary | ICD-10-CM | POA: Diagnosis not present

## 2017-09-02 NOTE — Progress Notes (Signed)
Remote pacemaker transmission.   

## 2017-09-04 ENCOUNTER — Encounter: Payer: Self-pay | Admitting: Cardiology

## 2017-09-05 LAB — CUP PACEART REMOTE DEVICE CHECK
Date Time Interrogation Session: 20180913111834
Implantable Lead Implant Date: 20120326
Implantable Lead Location: 753860
MDC IDC LEAD IMPLANT DT: 20120326
MDC IDC LEAD LOCATION: 753859
MDC IDC PG IMPLANT DT: 20120326
Pulse Gen Model: 2210
Pulse Gen Serial Number: 7228315

## 2017-10-01 ENCOUNTER — Non-Acute Institutional Stay (SKILLED_NURSING_FACILITY): Payer: Medicare Other | Admitting: Family

## 2017-10-01 DIAGNOSIS — E039 Hypothyroidism, unspecified: Secondary | ICD-10-CM

## 2017-10-01 DIAGNOSIS — J9611 Chronic respiratory failure with hypoxia: Secondary | ICD-10-CM

## 2017-10-01 DIAGNOSIS — R627 Adult failure to thrive: Secondary | ICD-10-CM

## 2017-10-01 DIAGNOSIS — G309 Alzheimer's disease, unspecified: Secondary | ICD-10-CM | POA: Diagnosis not present

## 2017-10-01 DIAGNOSIS — F0281 Dementia in other diseases classified elsewhere with behavioral disturbance: Secondary | ICD-10-CM | POA: Diagnosis not present

## 2017-10-01 NOTE — Progress Notes (Signed)
Location:  [facility] Room Number: 9 Place of Service:  SNF [phone]) Provider: Levon Penning FNP-C   Blanchie Serve, MD  Patient Care Team: Blanchie Serve, MD as PCP - General (Internal Medicine) Irine Seal, MD as Consulting Physician (Urology) Verl Blalock, Marijo Conception, MD (Inactive) as Consulting Physician (Cardiology) [place], Denice Bors, MD as Consulting Physician (Cardiology) Rigoberto Noel, MD as Consulting Physician (Pulmonary Disease) Leonce Bale, Nelda Bucks, NP as Nurse Practitioner (Family Medicine)  Extended Emergency Contact Information Primary Emergency Contact: Kathline Magic, [place] of [place] Phone: [phone] Relation: Daughter Secondary Emergency Contact: Johnnette Gourd [place] of [place] Phone: [phone] Mobile Phone: [phone] Relation: Son  Code Status: DNR Goals of care: Advanced Directive information Advanced Directives 08/30/2017  Does Patient Have a Medical Advance Directive? Yes  Type of Advance Directive Out of facility DNR (pink MOST or yellow form);Living will  Does patient want to make changes to medical advance directive? No - Patient declined  Copy of [place] in Chart? -  Pre-existing out of facility DNR order (yellow form or pink MOST form) Yellow form placed in chart (order not valid for inpatient use)     Chief Complaint  Patient presents with  . Medical Management of Chronic Issues    HPI:  Pt is a 83 y.o. male seen today [place] for medical management of chronic diseases.He is seen in his room today in bed watching TV. He has a medical history of HTN, CHF,CAD, Cardiomegaly, Afib, PVD, chronic respiratory failure,type 2 DM currently off medication, GERD, Failure to thrive, Hypothyroidism, Alzheimer's disease among other conditions.He denies any acute issues this visit.He has had 6 pounds weight loss over one month.His weight has continue  to progressively decline over several months despite protein supplements.Hospice service continues to follow up.     Past Medical History:  Diagnosis Date  . ANEMIA-IRON DEFICIENCY 05/27/2007  . ANXIETY 11/14/2007  . BENIGN PROSTATIC HYPERTROPHY 11/14/2007  . CAROTID ARTERY DISEASE 10/02/2010  . Chronic combined systolic and diastolic CHF (congestive heart failure) ([place]) 10/10/2014  . CKD stage 3 due to type 1 diabetes mellitus ([place]) 03/17/2015  . COLONIC POLYPS, HX OF 05/27/2007  . CORONARY ARTERY DISEASE 11/14/2007   prior bypass [phone]; Inferior MI 11/07; Severe 3 vessel disease; occluded SVG to RCA; SVG to OM subtotalled, SVG to diagonal with ostial 70 and 80 mid; EF 45. PCI of SVG to OM. FU myovue showed EF 56 with inferior infarct and very mild peri-infarct ischemia  . DEPRESSION 11/14/2007  . DIABETES MELLITUS, TYPE II 11/14/2007  . DIVERTICULITIS, HX OF 05/27/2007  . DM (diabetes mellitus), type 2 with renal complications ([place]) 8/50/2774  . DVT (deep venous thrombosis) ([place]) 03/21/2011  . Dyspnea 11/09/2014  . Effusion of left knee 03/17/2015  . Failure to thrive in adult 05/03/2015  . FOOT PAIN, BILATERAL 12/08/2010  . Generalized weakness 05/20/2015  . GERD 11/14/2007  . Hearing loss 05/13/2009   Qualifier: Diagnosis of  By: Jenny Reichmann MD, Hunt Oris   . Hyperlipemia 11/14/2007   Qualifier: Diagnosis of  By: Jenny Reichmann MD, Hunt Oris    . HYPERLIPIDEMIA 11/14/2007  . HYPERTENSION 11/14/2007  . HYPOTENSION, ORTHOSTATIC 03/06/2011  . Intermittent high-grade heart block 09/05/2009   s/p pacemaker  . Ischemic cardiomyopathy    a. echo (10/15):  mod focal basal septal hypertrophy, EF 40-45%, inf AK, Gr 1 DD, Al sclerosis,  mild AI, MAC, mild MR, mod LAE, lipomatous hypertrophy, small effusion (no hemodynamic compromise)   . Memory loss 06/01/2008  . Myalgia and myositis, unspecified 04/24/2011  . Osteoarthrosis, unspecified whether generalized or localized, unspecified site   . OSTEOPOROSIS 06/02/2010  .  Other malaise and fatigue 04/24/2011  . Pacemaker-St. Jude 02/02/2008   St. Jude  . Peripheral neuropathy 03/27/2011  . PERIPHERAL VASCULAR DISEASE 11/14/2007  . Prostate cancer ([place])   . Protein-calorie malnutrition, severe ([place]) 05/23/2015  . RENAL INSUFFICIENCY 11/14/2007  . Seborrheic keratosis 06/05/2011  . SECONDARY DM W/PERIPHERAL CIRC D/O UNCONTROLLED 11/07/2010  . Severe back pain   . SYNCOPE 09/08/2008  . Unspecified hearing loss 05/13/2009  . Vertebral compression fracture ([place]) 09/05/2009   Qualifier: Diagnosis of  By: Jenny Reichmann MD, Hunt Oris   . [name] 04/04/2015  . Weight loss 10/10/2014   Past Surgical History:  Procedure Laterality Date  . APPENDECTOMY    . CHOLECYSTECTOMY    . CORONARY ARTERY BYPASS GRAFT  2007  . CYSTOSCOPY    . PACEMAKER INSERTION  2012   Caryl Comes, MD  . prostate needle biopsy    . s/p bilat knee replacement  1992   Paul, MD  . s/p coronary stent x 1      Allergies  Allergen Reactions  . Hydrocodone     hallucination  . Tramadol     hallucination    Allergies as of 10/01/2017      Reactions   Hydrocodone    hallucination   Tramadol    hallucination      Medication List       Accurate as of 10/01/17  3:39 PM. Always use your most recent med list.          BOOST DIABETIC Liqd Take 237 mLs by mouth 2 (two) times daily. May also have one at [facility] as needed   carbamide peroxide 6.5 % OTIC solution Commonly known as:  DEBROX 5 drops. Instil 5 - 10 drops in each ear at bedtime 3 times a week every other month   docusate sodium 100 MG capsule Commonly known as:  COLACE Take 100 mg by mouth 2 (two) times daily.   feeding supplement (PRO-STAT SUGAR FREE 64) Liqd Take 30 mLs by mouth 3 (three) times daily with meals.   fentaNYL 25 MCG/HR patch Commonly known as:  DURAGESIC Apply fresh patch every 3 days to help pain. Remove old patch.   furosemide 20 MG tablet Commonly known as:  LASIX Take 10 mg by mouth as needed for edema.   levothyroxine  25 MCG tablet Commonly known as:  SYNTHROID, LEVOTHROID Take 25 mcg by mouth daily.   nitroGLYCERIN 0.4 MG SL tablet Commonly known as:  NITROSTAT Place 0.4 mg under the tongue every 5 (five) minutes as needed for chest pain.   OXYGEN Inhale 1 L/min into the lungs as needed.   risperiDONE 0.25 MG tablet Commonly known as:  RISPERDAL Take 0.25 mg by mouth every other day.   SYSTANE BALANCE 0.6 % Soln Generic drug:  Propylene Glycol Apply to eye. One drop both eyes 2 times a day due to dry eyes   zinc oxide 20 % ointment Apply 1 application topically. Apply ointment twice daily as needed to buttocks , apply to boney prominences mid spine three times daily as needed       Review of Systems  Unable to perform ROS: Dementia    Immunization History  Administered Date(s) Administered  . Influenza Whole  09/23/2010, 09/23/2012  . Influenza,inj,Quad PF,6+ Mos 10/09/2014  . Influenza-Unspecified 09/10/2013, 09/30/2015, 10/05/2016  . PPD Test 05/23/2015  . Pneumococcal Conjugate-13 04/27/2017  . Pneumococcal Polysaccharide-23 03/31/2009  . Td 03/31/2009   Pertinent  Health Maintenance Due  Topic Date Due  . INFLUENZA VACCINE  07/24/2017  . HEMOGLOBIN A1C  09/28/2017  . FOOT EXAM  12/28/2017  . URINE MICROALBUMIN  04/30/2018  . OPHTHALMOLOGY EXAM  06/12/2018  . PNA vac Low Risk Adult  Completed   Fall Risk  07/26/2017 09/26/2015 05/26/2015 04/29/2015 04/04/2015  Falls in the past year? Yes Yes Yes Yes Yes  Number falls in past yr: 1 1 1 1 1   Injury with Fall? No No Yes Yes Yes  Risk Factor Category  - - High Fall Risk - High Fall Risk  Risk for fall due to : - Impaired balance/gait History of fall(s);Impaired balance/gait;Impaired mobility - History of fall(s);Impaired balance/gait;Impaired mobility  Follow up - Falls evaluation completed Falls evaluation completed - Falls evaluation completed    Vitals:   10/01/17 1030  BP: 100/61  Pulse: 80  Resp: 20  Temp: (!) 96.4 F  (35.8 C)  SpO2: 95%  Weight: 136 lb 3.2 oz (61.8 kg)  Height: 5' 9"  (1.753 m)   Body mass index is 20.11 kg/m. Physical Exam  Constitutional:  Frail elderly in no acute distress   HENT:  Head: Normocephalic.  Right Ear: External ear normal.  Left Ear: External ear normal.  Mouth/Throat: Oropharynx is clear and moist. No oropharyngeal exudate.  HOH  Eyes: Pupils are equal, round, and reactive to light. Conjunctivae and EOM are normal. Right eye exhibits no discharge. Left eye exhibits no discharge. No scleral icterus.  Eye glasses in place  Neck: Normal range of motion. No JVD present. No thyromegaly present.  Cardiovascular: Intact distal pulses.  Exam reveals no gallop and no friction rub.   Murmur heard. pacemaker  Pulmonary/Chest: Effort normal and breath sounds normal. No respiratory distress. He has no wheezes. He has no rales.  On continuous oxygen 2 liters via Nasal cannula.   Abdominal: Soft. Bowel sounds are normal. He exhibits no distension. There is no tenderness. There is no rebound and no guarding.  Genitourinary:  Genitourinary Comments: Incontinent   Musculoskeletal: He exhibits no tenderness.  Moves x 4 extremities.Bilateral lower extremities trace edema.   Lymphadenopathy:    He has no cervical adenopathy.  Neurological: He is alert. Coordination normal.  Pleasantly confused at baseline   Skin: Skin is warm and dry. No rash noted. No erythema. No pallor.  Skin intact   Psychiatric: He has a normal mood and affect. His behavior is normal.   Labs reviewed:  Recent Labs  11/01/16 03/29/17 04/11/17  NA 139 137 134*  K 4.4 4.0 4.4  BUN 48* 48* 57*  CREATININE 1.2 1.1 1.3    Recent Labs  11/01/16 03/29/17  AST 22 16  ALT 14 8*  ALKPHOS 106 112    Recent Labs  11/01/16 03/29/17  WBC 5.6 6.4  HGB 14.4 13.8  HCT 43 41  PLT 124* 132*   Lab Results  Component Value Date   TSH 4.71 03/29/2017   Lab Results  Component Value Date   HGBA1C 6.7  03/29/2017   Lab Results  Component Value Date   CHOL 132 02/07/2015   HDL 33 (A) 02/07/2015   LDLCALC 76 02/07/2015   LDLDIRECT 85.6 07/19/2010   TRIG 115 02/07/2015   CHOLHDL 4 03/06/2011  Significant Diagnostic Results in last 30 days:  No results found.  Assessment/Plan 1. Hypothyroidism Lab Results  Component Value Date   TSH 4.71 03/29/2017  Continue on levothyroxine 25 mcg tablet daily. Recheck TSH level on next lab day if desired by POA. Patient currently on hospice service.   2. Chronic respiratory failure with hypoxia  Breathing stable. Continue on oxygen 2 liters via nasal cannula.   3. Alzheimer's dementia with behavioral disturbance, unspecified timing of dementia onset Declined expected.No new behavioral issues.Continue to assist with ADL's.skin care. Fall and safety precautions.continue to monitor.    4. Failure to thrive in adult Has had progressive weight loss despite use of protein supplements.  Wt 136.2 lbs (09/2017) Wt 142 lbs  (08/2017) Wt 150 lbs  (07/2017) Wt 151 lbs  (06/2017) Continue on current supplements. Follow up with facility Nutritionist. Continue to follow up with Hospice service.   Family/ staff Communication: Reviewed plan of care with patient and facility Nurse supervisor   Labs/tests ordered: TSH level on next lab day.    Sandrea Hughs, NP

## 2017-10-03 ENCOUNTER — Encounter: Payer: Self-pay | Admitting: *Deleted

## 2017-10-03 DIAGNOSIS — I10 Essential (primary) hypertension: Secondary | ICD-10-CM | POA: Diagnosis not present

## 2017-10-03 LAB — TSH: TSH: 3.12 (ref 0.41–5.90)

## 2017-10-04 ENCOUNTER — Non-Acute Institutional Stay (SKILLED_NURSING_FACILITY): Payer: Medicare Other | Admitting: Family

## 2017-10-04 DIAGNOSIS — R252 Cramp and spasm: Secondary | ICD-10-CM | POA: Diagnosis not present

## 2017-10-04 NOTE — Progress Notes (Signed)
Location:  [facility] Room Number: 9 Place of Service:  SNF [phone]) Provider: Io Dieujuste FNP-C  Blanchie Serve, MD  Patient Care Team: Blanchie Serve, MD as PCP - General (Internal Medicine) Irine Seal, MD as Consulting Physician (Urology) Verl Blalock, Marijo Conception, MD (Inactive) as Consulting Physician (Cardiology) [place], Denice Bors, MD as Consulting Physician (Cardiology) Rigoberto Noel, MD as Consulting Physician (Pulmonary Disease) Dierks Wach, Nelda Bucks, NP as Nurse Practitioner (Family Medicine)  Extended Emergency Contact Information Primary Emergency Contact: Kathline Magic, [place] of [place] Phone: [phone] Relation: Daughter Secondary Emergency Contact: Johnnette Gourd [place] of [place] Phone: [phone] Mobile Phone: [phone] Relation: Son  Code Status:  DNR Goals of care: Advanced Directive information Advanced Directives 08/30/2017  Does Patient Have a Medical Advance Directive? Yes  Type of Advance Directive Out of facility DNR (pink MOST or yellow form);Living will  Does patient want to make changes to medical advance directive? No - Patient declined  Copy of [place] in Chart? -  Pre-existing out of facility DNR order (yellow form or pink MOST form) Yellow form placed in chart (order not valid for inpatient use)     Chief Complaint  Patient presents with  . Acute Visit    jerking movement     HPI:  Pt is a 83 y.o. male seen today at [facility] for an acute visit for evaluation of "jerking movement ". He is seen in his room today per patient's daughter's request who states 11 hours after patient had a flu short he had jerking movement. Movement described as throwing hands up in the air  and kicking legs all over the bed. Patient condition improved after he was given tylenol for pain. No symptoms noticed today.Patient orientation was at baseline. No signs  of seizures. Patient's daughter thinks patient had a reaction to the flu shot.No signs of reaction on flu shot site on right deltoid.    Past Medical History:  Diagnosis Date  . ANEMIA-IRON DEFICIENCY 05/27/2007  . ANXIETY 11/14/2007  . BENIGN PROSTATIC HYPERTROPHY 11/14/2007  . CAROTID ARTERY DISEASE 10/02/2010  . Chronic combined systolic and diastolic CHF (congestive heart failure) ([place]) 10/10/2014  . CKD stage 3 due to type 1 diabetes mellitus ([place]) 03/17/2015  . COLONIC POLYPS, HX OF 05/27/2007  . CORONARY ARTERY DISEASE 11/14/2007   prior bypass [phone]; Inferior MI 11/07; Severe 3 vessel disease; occluded SVG to RCA; SVG to OM subtotalled, SVG to diagonal with ostial 70 and 80 mid; EF 45. PCI of SVG to OM. FU myovue showed EF 56 with inferior infarct and very mild peri-infarct ischemia  . DEPRESSION 11/14/2007  . DIABETES MELLITUS, TYPE II 11/14/2007  . DIVERTICULITIS, HX OF 05/27/2007  . DM (diabetes mellitus), type 2 with renal complications ([place]) 1/32/4401  . DVT (deep venous thrombosis) ([place]) 03/21/2011  . Dyspnea 11/09/2014  . Effusion of left knee 03/17/2015  . Failure to thrive in adult 05/03/2015  . FOOT PAIN, BILATERAL 12/08/2010  . Generalized weakness 05/20/2015  . GERD 11/14/2007  . Hearing loss 05/13/2009   Qualifier: Diagnosis of  By: Jenny Reichmann MD, Hunt Oris   . Hyperlipemia 11/14/2007   Qualifier: Diagnosis of  By: Jenny Reichmann MD, Hunt Oris    . HYPERLIPIDEMIA 11/14/2007  . HYPERTENSION 11/14/2007  . HYPOTENSION, ORTHOSTATIC 03/06/2011  . Intermittent high-grade heart block 09/05/2009   s/p pacemaker  . Ischemic cardiomyopathy  a. echo (10/15):  mod focal basal septal hypertrophy, EF 40-45%, inf AK, Gr 1 DD, Al sclerosis, mild AI, MAC, mild MR, mod LAE, lipomatous hypertrophy, small effusion (no hemodynamic compromise)   . Memory loss 06/01/2008  . Myalgia and myositis, unspecified 04/24/2011  . Osteoarthrosis, unspecified whether generalized or localized, unspecified site   .  OSTEOPOROSIS 06/02/2010  . Other malaise and fatigue 04/24/2011  . Pacemaker-St. Jude 02/02/2008   St. Jude  . Peripheral neuropathy 03/27/2011  . PERIPHERAL VASCULAR DISEASE 11/14/2007  . Prostate cancer ([place])   . Protein-calorie malnutrition, severe ([place]) 05/23/2015  . RENAL INSUFFICIENCY 11/14/2007  . Seborrheic keratosis 06/05/2011  . SECONDARY DM W/PERIPHERAL CIRC D/O UNCONTROLLED 11/07/2010  . Severe back pain   . SYNCOPE 09/08/2008  . Unspecified hearing loss 05/13/2009  . Vertebral compression fracture ([place]) 09/05/2009   Qualifier: Diagnosis of  By: Jenny Reichmann MD, Hunt Oris   . [name] 04/04/2015  . Weight loss 10/10/2014   Past Surgical History:  Procedure Laterality Date  . APPENDECTOMY    . CHOLECYSTECTOMY    . CORONARY ARTERY BYPASS GRAFT  2007  . CYSTOSCOPY    . PACEMAKER INSERTION  2012   Caryl Comes, MD  . prostate needle biopsy    . s/p bilat knee replacement  1992   Paul, MD  . s/p coronary stent x 1      Allergies  Allergen Reactions  . Hydrocodone     hallucination  . Tramadol     hallucination    Outpatient Encounter Prescriptions as of 10/04/2017  Medication Sig  . Amino Acids-Protein Hydrolys (FEEDING SUPPLEMENT, PRO-STAT SUGAR FREE 64,) LIQD Take 30 mLs by mouth 3 (three) times daily with meals.   . carbamide peroxide (DEBROX) 6.5 % otic solution 5 drops. Instil 5 - 10 drops in each ear at bedtime 3 times a week every other month  . docusate sodium (COLACE) 100 MG capsule Take 100 mg by mouth 2 (two) times daily.  . fentaNYL (DURAGESIC) 25 MCG/HR patch Apply fresh patch every 3 days to help pain. Remove old patch.  . furosemide (LASIX) 20 MG tablet Take 10 mg by mouth as needed for edema.   Marland Kitchen levothyroxine (SYNTHROID, LEVOTHROID) 25 MCG tablet Take 25 mcg by mouth daily.  . nitroGLYCERIN (NITROSTAT) 0.4 MG SL tablet Place 0.4 mg under the tongue every 5 (five) minutes as needed for chest pain.  . Nutritional Supplements (BOOST DIABETIC) LIQD Take 237 mLs by mouth 2 (two)  times daily. May also [facility] one at [facility] as needed  . OXYGEN Inhale 1 L/min into the lungs as needed.   Marland Kitchen Propylene Glycol (SYSTANE BALANCE) 0.6 % SOLN Apply to eye. One drop both eyes 2 times a day due to dry eyes  . risperiDONE (RISPERDAL) 0.25 MG tablet Take 0.25 mg by mouth every other day.  . zinc oxide 20 % ointment Apply 1 application topically. Apply ointment twice daily as needed to buttocks , apply to boney prominences mid spine three times daily as needed   No facility-administered encounter medications on file as of 10/04/2017.     Review of Systems  Unable to perform ROS: Dementia    Immunization History  Administered Date(s) Administered  . Influenza Whole 09/23/2010, 09/23/2012  . Influenza,inj,Quad PF,6+ Mos 10/09/2014  . Influenza-Unspecified 09/10/2013, 09/30/2015, 10/05/2016  . PPD Test 05/23/2015  . Pneumococcal Conjugate-13 04/27/2017  . Pneumococcal Polysaccharide-23 03/31/2009  . Td 03/31/2009   Pertinent  Health Maintenance Due  Topic Date Due  .  INFLUENZA VACCINE  07/24/2017  . HEMOGLOBIN A1C  09/28/2017  . FOOT EXAM  12/28/2017  . URINE MICROALBUMIN  04/30/2018  . OPHTHALMOLOGY EXAM  06/12/2018  . PNA vac Low Risk Adult  Completed   Fall Risk  07/26/2017 09/26/2015 05/26/2015 04/29/2015 04/04/2015  Falls in the past year? _0   Number falls in past yr: _1 Injury with Fall? No No Yes Yes Yes  Risk Factor Category  - - High Fall Risk - High Fall Risk  Risk for fall due to : - Impaired balance/gait History of fall(s);Impaired balance/gait;Impaired mobility - History of fall(s);Impaired balance/gait;Impaired mobility  Follow up - Falls evaluation completed Falls evaluation completed - Falls evaluation completed    Vitals:   10/04/17 1130  BP: (!) 142/61  Pulse: 60  Resp: 18  Temp: 98.2 F (36.8 C)  SpO2: 98%  Weight: 136 lb 3.2 oz (61.8 kg)  Height: _2  (1.753 m)   Body mass index is 20.11 kg/m. Physical Exam  Constitutional:    Frail elderly in no acute distress   HENT:  Head: Normocephalic.  Right Ear: External ear normal.  Left Ear: External ear normal.  Mouth/Throat: Oropharynx is clear and moist. No oropharyngeal exudate.  Eyes: Pupils are equal, round, and reactive to light. Conjunctivae and EOM are normal. Right eye exhibits no discharge. Left eye exhibits no discharge. No scleral icterus.  Eye glasses in place   Neck: Normal range of motion. No JVD present. No thyromegaly present.  Cardiovascular: Exam reveals no gallop and no friction rub.   Murmur heard. Pace maker   Pulmonary/Chest: Effort normal and breath sounds normal. No respiratory distress. He has no wheezes. He has no rales.  Oxygen via nasal cannula in place   Abdominal: Soft. Bowel sounds are normal. He exhibits no distension. There is no tenderness. There is no rebound and no guarding.  Musculoskeletal: He exhibits no tenderness.  Moves x 4 extremities. Spends most time in bed.   Lymphadenopathy:    He has no cervical adenopathy.  Neurological: He is alert. Coordination normal.  Pleasantly confused at his baseline   Skin: Skin is warm and dry. No rash noted. No erythema. No pallor.  Psychiatric: He has a normal mood and affect.    Labs reviewed:  Recent Labs  11/01/16 03/29/17 04/11/17  NA 139 137 134*  K 4.4 4.0 4.4  BUN 48* 48* 57*  CREATININE 1.2 1.1 1.3    Recent Labs  11/01/16 03/29/17  AST 22 16  ALT 14 8*  ALKPHOS 106 112    Recent Labs  11/01/16 03/29/17  WBC 5.6 6.4  HGB 14.4 13.8  HCT 43 41  PLT 124* 132*   Lab Results  Component Value Date   TSH 3.12 10/03/2017   Lab Results  Component Value Date   HGBA1C 6.7 03/29/2017   Lab Results  Component Value Date   CHOL 132 02/07/2015   HDL 33 (A) 02/07/2015   LDLCALC 76 02/07/2015   LDLDIRECT 85.6 07/19/2010   TRIG 115 02/07/2015   CHOLHDL 4 03/06/2011    Significant Diagnostic Results in last 30 days:  No results found.  Assessment/Plan    Jerking movements of extremities Afebrile.No jerking movements reported today.Exam findings negative.Signs reported during the night daughter thought it was reaction to flu shot after 11 hours.VSS. Continue to monitor.  Family/ staff Communication: Reviewed plan of care with patient's daughter, patient  and facility Nurse.  Labs/tests ordered: None   Rupert Azzara C Orel Hord, NP

## 2017-10-28 DIAGNOSIS — E118 Type 2 diabetes mellitus with unspecified complications: Secondary | ICD-10-CM | POA: Diagnosis not present

## 2017-10-28 LAB — HEMOGLOBIN A1C: HEMOGLOBIN A1C: 5.7

## 2017-10-29 ENCOUNTER — Encounter: Payer: Self-pay | Admitting: *Deleted

## 2017-11-06 ENCOUNTER — Encounter: Payer: Self-pay | Admitting: Internal Medicine

## 2017-11-06 ENCOUNTER — Non-Acute Institutional Stay (SKILLED_NURSING_FACILITY): Payer: Medicare Other | Admitting: Internal Medicine

## 2017-11-06 DIAGNOSIS — F01518 Vascular dementia, unspecified severity, with other behavioral disturbance: Secondary | ICD-10-CM

## 2017-11-06 DIAGNOSIS — G8929 Other chronic pain: Secondary | ICD-10-CM

## 2017-11-06 DIAGNOSIS — I495 Sick sinus syndrome: Secondary | ICD-10-CM | POA: Diagnosis not present

## 2017-11-06 DIAGNOSIS — E039 Hypothyroidism, unspecified: Secondary | ICD-10-CM | POA: Diagnosis not present

## 2017-11-06 DIAGNOSIS — M549 Dorsalgia, unspecified: Secondary | ICD-10-CM

## 2017-11-06 DIAGNOSIS — I5042 Chronic combined systolic (congestive) and diastolic (congestive) heart failure: Secondary | ICD-10-CM | POA: Diagnosis not present

## 2017-11-06 DIAGNOSIS — F0151 Vascular dementia with behavioral disturbance: Secondary | ICD-10-CM | POA: Diagnosis not present

## 2017-11-06 NOTE — Progress Notes (Signed)
Location:  [facility] Room Number: 9 Place of Service:  SNF [phone]) Provider:  Blanchie Serve MD  Blanchie Serve, MD  Patient Care Team: Blanchie Serve, MD as PCP - General (Internal Medicine) Irine Seal, MD as Consulting Physician (Urology) Verl Blalock, Marijo Conception, MD (Inactive) as Consulting Physician (Cardiology) [facility] Crenshaw, Denice Bors, MD as Consulting Physician (Cardiology) Rigoberto Noel, MD as Consulting Physician (Pulmonary Disease) Ngetich, Nelda Bucks, NP as Nurse Practitioner (Family Medicine)  Extended Emergency Contact Information Primary Emergency Contact: Kathline Magic, Rosezella [place]ump[place] of [place] Phone: [phone] Relation: Daughter Secondary Emergency Contact: Johnnette Gourd [place] of [place] Phone: [phone] Mobile Phone: [phone] Relation: Son  Code Status:  DNR  Goals of care: Advanced Directive information Advanced Directives 11/06/2017  Does Patient Have a Medical Advance Directive? Yes  Type of Advance Directive Living will;Out of facility DNR (pink MOST or yellow form)  Does patient want to make changes to medical advance directive? No - Patient declined  Copy of [place] in Chart? -  Pre-existing out of facility DNR order (yellow form or pink MOST form) Yellow form placed in chart (order not valid for inpatient use);Pink MOST form placed in chart (order not valid for inpatient use)     Chief Complaint  Patient presents with  . Medical Management of Chronic Issues    Routine Visit     HPI:  Pt is a 83 y.o. male seen today for medical management of chronic diseases. He had a fall on 10/13/17. He is under hospice services. He is mostly in bed. He refuses his medications at times. He is oriented only to person. He appears comfortable. He denies any concern this visit. His dementia limits his history taking and ROS.    Past Medical History:  Diagnosis Date  .  ANEMIA-IRON DEFICIENCY 05/27/2007  . ANXIETY 11/14/2007  . BENIGN PROSTATIC HYPERTROPHY 11/14/2007  . CAROTID ARTERY DISEASE 10/02/2010  . Chronic combined systolic and diastolic CHF (congestive heart failure) ([place]) 10/10/2014  . CKD stage 3 due to type 1 diabetes mellitus ([place]) 03/17/2015  . COLONIC POLYPS, HX OF 05/27/2007  . CORONARY ARTERY DISEASE 11/14/2007   prior bypass [phone]; Inferior MI 11/07; Severe 3 vessel disease; occluded SVG to RCA; SVG to OM subtotalled, SVG to diagonal with ostial 70 and 80 mid; EF 45. PCI of SVG to OM. FU myovue showed EF 56 with inferior infarct and very mild peri-infarct ischemia  . DEPRESSION 11/14/2007  . DIABETES MELLITUS, TYPE II 11/14/2007  . DIVERTICULITIS, HX OF 05/27/2007  . DM (diabetes mellitus), type 2 with renal complications ([place]) 5/59/7416  . DVT (deep venous thrombosis) ([place]) 03/21/2011  . Dyspnea 11/09/2014  . Effusion of left knee 03/17/2015  . Failure to thrive in adult 05/03/2015  . FOOT PAIN, BILATERAL 12/08/2010  . Generalized weakness 05/20/2015  . GERD 11/14/2007  . Hearing loss 05/13/2009   Qualifier: Diagnosis of  By: Jenny Reichmann MD, Hunt Oris   . Hyperlipemia 11/14/2007   Qualifier: Diagnosis of  By: Jenny Reichmann MD, Hunt Oris    . HYPERLIPIDEMIA 11/14/2007  . HYPERTENSION 11/14/2007  . HYPOTENSION, ORTHOSTATIC 03/06/2011  . Intermittent high-grade heart block 09/05/2009   s/p pacemaker  . Ischemic cardiomyopathy    a. echo (10/15):  mod focal basal septal hypertrophy, EF 40-45%, inf AK, Gr 1 DD, Al sclerosis, mild AI, MAC, mild MR, mod LAE, lipomatous hypertrophy, small  effusion (no hemodynamic compromise)   . Memory loss 06/01/2008  . Myalgia and myositis, unspecified 04/24/2011  . Osteoarthrosis, unspecified whether generalized or localized, unspecified site   . OSTEOPOROSIS 06/02/2010  . Other malaise and fatigue 04/24/2011  . Pacemaker-St. Jude 02/02/2008   St. Jude  . Peripheral neuropathy 03/27/2011  . PERIPHERAL VASCULAR DISEASE 11/14/2007  .  Prostate cancer ([place])   . Protein-calorie malnutrition, severe ([place]) 05/23/2015  . RENAL INSUFFICIENCY 11/14/2007  . Seborrheic keratosis 06/05/2011  . SECONDARY DM W/PERIPHERAL CIRC D/O UNCONTROLLED 11/07/2010  . Severe back pain   . SYNCOPE 09/08/2008  . Unspecified hearing loss 05/13/2009  . Vertebral compression fracture ([place]) 09/05/2009   Qualifier: Diagnosis of  By: Jenny Reichmann MD, Hunt Oris   . [name] 04/04/2015  . Weight loss 10/10/2014   Past Surgical History:  Procedure Laterality Date  . APPENDECTOMY    . CHOLECYSTECTOMY    . CORONARY ARTERY BYPASS GRAFT  2007  . CYSTOSCOPY    . PACEMAKER INSERTION  2012   Caryl Comes, MD  . prostate needle biopsy    . s/p bilat knee replacement  1992   Paul, MD  . s/p coronary stent x 1      Allergies  Allergen Reactions  . Hydrocodone     hallucination  . Tramadol     hallucination    Outpatient Encounter Medications as of 11/06/2017  Medication Sig  . bisacodyl (DULCOLAX) 10 MG suppository Place 10 mg every three (3) days as needed rectally for moderate constipation.  . carbamide peroxide (DEBROX) 6.5 % otic solution 5 drops. Instil 5 - 10 drops in each ear at bedtime 3 times a week every other month  . fentaNYL (DURAGESIC) 25 MCG/HR patch Apply fresh patch every 3 days to help pain. Remove old patch.  . furosemide (LASIX) 20 MG tablet Take 20 mg daily by mouth.   . levothyroxine (SYNTHROID, LEVOTHROID) 25 MCG tablet Take 25 mcg by mouth daily.  . nitroGLYCERIN (NITROSTAT) 0.4 MG SL tablet Place 0.4 mg under the tongue every 5 (five) minutes as needed for chest pain.  . Nutritional Supplements (BOOST DIABETIC) LIQD Take 237 mLs by mouth 2 (two) times daily. May also have one at [facility] as needed  . OXYGEN Inhale 1 L/min into the lungs as needed.   Marland Kitchen Propylene Glycol (SYSTANE BALANCE) 0.6 % SOLN Apply to eye. One drop both eyes 2 times a day due to dry eyes  . risperiDONE (RISPERDAL) 0.25 MG tablet Take 0.25 mg by mouth every other day.  . zinc oxide  20 % ointment Apply 1 application topically. Apply ointment twice daily as needed to buttocks , apply to boney prominences mid spine three times daily as needed  . [DISCONTINUED] Amino Acids-Protein Hydrolys (FEEDING SUPPLEMENT, PRO-STAT SUGAR FREE 64,) LIQD Take 30 mLs by mouth 3 (three) times daily with meals.   . [DISCONTINUED] docusate sodium (COLACE) 100 MG capsule Take 100 mg by mouth 2 (two) times daily.   No facility-administered encounter medications on file as of 11/06/2017.     Review of Systems  Constitutional: Positive for appetite change. Negative for chills and fever.  HENT: Positive for hearing loss.   Eyes: Positive for visual disturbance.  Respiratory: Negative for cough and shortness of breath.        On oxygen by nasal canula  Cardiovascular: Positive for leg swelling. Negative for chest pain and palpitations.  Gastrointestinal: Negative for abdominal pain, nausea and vomiting.  Genitourinary: Negative for dysuria.  Musculoskeletal:  Positive for gait problem.  Skin: Negative for wound.  Neurological: Negative for dizziness and headaches.  Psychiatric/Behavioral: Positive for confusion.    Immunization History  Administered Date(s) Administered  . Influenza Whole 09/23/2010, 09/23/2012  . Influenza,inj,Quad PF,6+ Mos 10/09/2014  . Influenza-Unspecified 09/10/2013, 09/30/2015, 10/05/2016, 10/03/2017  . PPD Test 05/23/2015  . Pneumococcal Conjugate-13 04/27/2017  . Pneumococcal Polysaccharide-23 03/31/2009  . Td 03/31/2009   Pertinent  Health Maintenance Due  Topic Date Due  . FOOT EXAM  12/28/2017  . HEMOGLOBIN A1C  04/27/2018  . URINE MICROALBUMIN  04/30/2018  . OPHTHALMOLOGY EXAM  06/12/2018  . INFLUENZA VACCINE  Completed  . PNA vac Low Risk Adult  Completed   Fall Risk  07/26/2017 09/26/2015 05/26/2015 04/29/2015 04/04/2015  Falls in the past year? _0   Number falls in past yr: _1 Injury with Fall? No No Yes Yes Yes  Risk Factor  Category  - - High Fall Risk - High Fall Risk  Risk for fall due to : - Impaired balance/gait History of fall(s);Impaired balance/gait;Impaired mobility - History of fall(s);Impaired balance/gait;Impaired mobility  Follow up - Falls evaluation completed Falls evaluation completed - Falls evaluation completed   Functional Status Survey:    Vitals:   11/06/17 1219  BP: 116/79  Pulse: 73  Resp: 18  Temp: 98 F (36.7 C)  TempSrc: Oral  SpO2: 92%  Weight: 134 lb 12.8 oz (61.1 kg)  Height: _2  (1.778 m)   Body mass index is 19.34 kg/m.   Wt Readings from Last 3 Encounters:  11/06/17 134 lb 12.8 oz (61.1 kg)  10/04/17 136 lb 3.2 oz (61.8 kg)  10/01/17 136 lb 3.2 oz (61.8 kg)   Physical Exam  Constitutional:  Thin built, frail, elderly male in no distress   HENT:  Head: Normocephalic and atraumatic.  Mouth/Throat: Oropharynx is clear and moist.  Hearing aid present  Eyes: Conjunctivae are normal. Pupils are equal, round, and reactive to light.  Corrective glasses  Neck: Neck supple.  Cardiovascular: Normal rate and regular rhythm.  Murmur heard. Systolic murmur present  Pulmonary/Chest: Effort normal. No respiratory distress. He has no wheezes. He has no rales.  On 1 l oxygen by nasal canula  Abdominal: Soft. Bowel sounds are normal. There is no tenderness. There is no guarding.  Musculoskeletal:  Trace leg edema, able to move all 4 extremities, generalized weakness   Lymphadenopathy:    He has no cervical adenopathy.  Neurological: He is alert.  Oriented to person only  Skin: Skin is warm and dry. He is not diaphoretic.  Easy bruising    Labs reviewed: Recent Labs    04/11/17 07/29/17 08/05/17  NA 134* 144 138  K 4.4 3.6 3.8  BUN 57* 33* 28*  CREATININE 1.3 1.2 0.8   Recent Labs    03/29/17 07/29/17 08/05/17  AST _3 ALT 8* 7* 8*  ALKPHOS 112 140* 141*   Recent Labs    03/29/17 07/29/17 08/05/17  WBC 6.4 6.0 5.4  HGB 13.8 12.5* 11.9*  HCT 41  38* 36*  PLT 132* 162 175   Lab Results  Component Value Date   TSH 3.12 10/03/2017   Lab Results  Component Value Date   HGBA1C 5.7 10/28/2017   Lab Results  Component Value Date   CHOL 132 02/07/2015   HDL 33 (A) 02/07/2015   LDLCALC 76 02/07/2015   LDLDIRECT 85.6 07/19/2010   TRIG 115 02/07/2015  CHOLHDL 4 03/06/2011    Significant Diagnostic Results in last 30 days:  No results found.  Assessment/Plan  Acquired hypothyroidism Continue levothyroxine and monitor  Chronic back pain With history of compression fracture of vertebrae. Nursing have concerns of his fentanyl patch slipping off. He is under hospice service with goals of care for comfort. He denies any pain this visit. He has few low BP readings of 100/60, 100/63, 105/52. D/c fentanyl patch. Start roxanol 20 mg/ml 5 mg qid with 5 mg q8h prn for breakthrough and reassess. Discussed this further with hospice physician and pharmacist.  Dementia with behavioral disturbance Supportive care, risperdal every other day for now.   Chronic CHF Continue lasix for now. Continue o2 as needed. Breathing comfortable this visit.   SA node dysfunction S/p pacemaker.    Family/ staff Communication: reviewed care plan with patient and charge nurse.   Labs/tests ordered:  none   Blanchie Serve, MD Internal Medicine [facility] [address] [place], [place] [address] Cell Phone (Monday-Friday 8 am - 5 pm): [phone] On Call: [phone] and follow prompts after 5 pm and on weekends Office Phone: [phone] Office Fax: [phone]

## 2017-12-02 ENCOUNTER — Ambulatory Visit (INDEPENDENT_AMBULATORY_CARE_PROVIDER_SITE_OTHER): Admitting: *Deleted

## 2017-12-02 DIAGNOSIS — I442 Atrioventricular block, complete: Secondary | ICD-10-CM

## 2017-12-02 NOTE — Progress Notes (Signed)
Remote pacemaker transmission.   

## 2017-12-06 ENCOUNTER — Encounter: Payer: Self-pay | Admitting: Cardiology

## 2017-12-06 ENCOUNTER — Encounter: Payer: Self-pay | Admitting: Family

## 2017-12-06 ENCOUNTER — Non-Acute Institutional Stay (SKILLED_NURSING_FACILITY): Payer: Medicare Other | Admitting: Family

## 2017-12-06 DIAGNOSIS — G309 Alzheimer's disease, unspecified: Secondary | ICD-10-CM

## 2017-12-06 DIAGNOSIS — R627 Adult failure to thrive: Secondary | ICD-10-CM | POA: Diagnosis not present

## 2017-12-06 DIAGNOSIS — E039 Hypothyroidism, unspecified: Secondary | ICD-10-CM

## 2017-12-06 DIAGNOSIS — F0281 Dementia in other diseases classified elsewhere with behavioral disturbance: Secondary | ICD-10-CM | POA: Diagnosis not present

## 2017-12-06 DIAGNOSIS — I5042 Chronic combined systolic (congestive) and diastolic (congestive) heart failure: Secondary | ICD-10-CM | POA: Diagnosis not present

## 2017-12-06 NOTE — Progress Notes (Signed)
Location:  [facility] Room Number: 9 Place of Service:  SNF [phone]) Provider: Laniyah Rosenwald FNP-C   Blanchie Serve, MD  Patient Care Team: Blanchie Serve, MD as PCP - General (Internal Medicine) Irine Seal, MD as Consulting Physician (Urology) Verl Blalock, Marijo Conception, MD (Inactive) as Consulting Physician (Cardiology) [place] Crenshaw, Denice Bors, MD as Consulting Physician (Cardiology) Rigoberto Noel, MD as Consulting Physician (Pulmonary Disease) Darol Cush, Nelda Bucks, NP as Nurse Practitioner (Family Medicine)  Extended Emergency Contact Information Primary Emergency Contact: Kathline Magic, [place] of [place] Phone: [phone] Relation: Daughter Secondary Emergency Contact: Johnnette Gourd [place] of [place] Phone: [phone] Mobile Phone: [phone] Relation: Son  Code Status: DNR  Goals of care: Advanced Directive information Advanced Directives 12/06/2017  Does Patient Have a Medical Advance Directive? Yes  Type of Advance Directive Living will;Out of facility DNR (pink MOST or yellow form)  Does patient want to make changes to medical advance directive? No - Patient declined  Copy of [place] in Chart? -  Pre-existing out of facility DNR order (yellow form or pink MOST form) Yellow form placed in chart (order not valid for inpatient use);Pink MOST form placed in chart (order not valid for inpatient use)     Chief Complaint  Patient presents with  . Medical Management of Chronic Issues    Routine Visit     HPI:  Pt is a 83 y.o. male seen today [facility] for medical management of chronic diseases.He has a medical history of HTN,CHF,Afib,CAD,PVD, Alzheimer's dementia  Among other conditions.He is seen in his room today with seater at bedside. Facility Nurse reports no new behavioral issues has been stable on risperidone.Pharmacy recommends discontinuation of risperidone 0.25 mg  tablet. Patient has failed GDR in the past with noted psychotic behaviors. Patient seater and Nurse states patient's condition currently stable on current medication. No recent fall episode.He has had progressive weight though weight seems to be stabilizing; 08/27/2017 wt 142.2 lbs;09/29/2017 wt 136.2 lbs; 10/30/2017 wt 138.4 lbs; 11/29/2017 wt 132.4 lbs.    Past Medical History:  Diagnosis Date  . ANEMIA-IRON DEFICIENCY 05/27/2007  . ANXIETY 11/14/2007  . BENIGN PROSTATIC HYPERTROPHY 11/14/2007  . CAROTID ARTERY DISEASE 10/02/2010  . Chronic combined systolic and diastolic CHF (congestive heart failure) ([place]) 10/10/2014  . CKD stage 3 due to type 1 diabetes mellitus ([place]) 03/17/2015  . COLONIC POLYPS, HX OF 05/27/2007  . CORONARY ARTERY DISEASE 11/14/2007   prior bypass [phone]; Inferior MI 11/07; Severe 3 vessel disease; occluded SVG to RCA; SVG to OM subtotalled, SVG to diagonal with ostial 70 and 80 mid; EF 45. PCI of SVG to OM. FU myovue showed EF 56 with inferior infarct and very mild peri-infarct ischemia  . DEPRESSION 11/14/2007  . DIABETES MELLITUS, TYPE II 11/14/2007  . DIVERTICULITIS, HX OF 05/27/2007  . DM (diabetes mellitus), type 2 with renal complications ([place]) 2/87/6811  . DVT (deep venous thrombosis) ([place]) 03/21/2011  . Dyspnea 11/09/2014  . Effusion of left knee 03/17/2015  . Failure to thrive in adult 05/03/2015  . FOOT PAIN, BILATERAL 12/08/2010  . Generalized weakness 05/20/2015  . GERD 11/14/2007  . Hearing loss 05/13/2009   Qualifier: Diagnosis of  By: Jenny Reichmann MD, Hunt Oris   . Hyperlipemia 11/14/2007   Qualifier: Diagnosis of  By: Jenny Reichmann MD, Hunt Oris    . HYPERLIPIDEMIA 11/14/2007  . HYPERTENSION 11/14/2007  .  HYPOTENSION, ORTHOSTATIC 03/06/2011  . Intermittent high-grade heart block 09/05/2009   s/p pacemaker  . Ischemic cardiomyopathy    a. echo (10/15):  mod focal basal septal hypertrophy, EF 40-45%, inf AK, Gr 1 DD, Al sclerosis, mild AI, MAC, mild MR, mod LAE, lipomatous  hypertrophy, small effusion (no hemodynamic compromise)   . Memory loss 06/01/2008  . Myalgia and myositis, unspecified 04/24/2011  . Osteoarthrosis, unspecified whether generalized or localized, unspecified site   . OSTEOPOROSIS 06/02/2010  . Other malaise and fatigue 04/24/2011  . Pacemaker-St. Jude 02/02/2008   St. Jude  . Peripheral neuropathy 03/27/2011  . PERIPHERAL VASCULAR DISEASE 11/14/2007  . Prostate cancer ([place])   . Protein-calorie malnutrition, severe ([place]) 05/23/2015  . RENAL INSUFFICIENCY 11/14/2007  . Seborrheic keratosis 06/05/2011  . SECONDARY DM W/PERIPHERAL CIRC D/O UNCONTROLLED 11/07/2010  . Severe back pain   . SYNCOPE 09/08/2008  . Unspecified hearing loss 05/13/2009  . Vertebral compression fracture ([place]) 09/05/2009   Qualifier: Diagnosis of  By: Jenny Reichmann MD, Hunt Oris   . Zachary Harris 04/04/2015  . Weight loss 10/10/2014   Past Surgical History:  Procedure Laterality Date  . APPENDECTOMY    . CHOLECYSTECTOMY    . CORONARY ARTERY BYPASS GRAFT  2007  . CYSTOSCOPY    . PACEMAKER INSERTION  2012   Caryl Comes, MD  . prostate needle biopsy    . s/p bilat knee replacement  1992   Paul, MD  . s/p coronary stent x 1      Allergies  Allergen Reactions  . Hydrocodone     hallucination  . Tramadol     hallucination    Allergies as of 12/06/2017      Reactions   Hydrocodone    hallucination   Tramadol    hallucination      Medication List        Accurate as of 12/06/17  3:23 PM. Always use your most recent med list.          ALPRAZolam 0.25 MG tablet Commonly known as:  XANAX Take 0.25 mg by mouth 2 (two) times daily as needed for anxiety.   bisacodyl 10 MG suppository Commonly known as:  DULCOLAX Place 10 mg every three (3) days as needed rectally for moderate constipation.   BOOST DIABETIC Liqd Take 237 mLs by mouth 2 (two) times daily. May also have one at [facility] as needed   carbamide peroxide 6.5 % OTIC solution Commonly known as:  DEBROX 5 drops. Instil 5 - 10  drops in each ear at bedtime 3 times a week every other month   fentaNYL 12 MCG/HR Commonly known as:  DURAGESIC - dosed mcg/hr Place 12.5 mcg onto the skin every 3 (three) days.   furosemide 20 MG tablet Commonly known as:  LASIX Take 20 mg daily by mouth.   levothyroxine 25 MCG tablet Commonly known as:  SYNTHROID, LEVOTHROID Take 25 mcg by mouth daily.   MORPHINE SULFATE (CONCENTRATE) PO Take 0.25 mLs by mouth every 8 (eight) hours as needed.   nitroGLYCERIN 0.4 MG SL tablet Commonly known as:  NITROSTAT Place 0.4 mg under the tongue every 5 (five) minutes as needed for chest pain.   OXYGEN Inhale 1 L/min into the lungs as needed.   risperiDONE 0.25 MG tablet Commonly known as:  RISPERDAL Take 0.25 mg by mouth every other day.   SYSTANE BALANCE 0.6 % Soln Generic drug:  Propylene Glycol Apply to eye. One drop both eyes 2 times a day due to  dry eyes   zinc oxide 20 % ointment Apply 1 application topically. Apply ointment twice daily as needed to buttocks , apply to boney prominences mid spine three times daily as needed       Review of Systems  Constitutional: Negative for activity change, appetite change, chills, fatigue and fever.  HENT: Positive for hearing loss. Negative for congestion, rhinorrhea, sinus pressure, sinus pain, sneezing and sore throat.   Eyes: Negative for discharge, redness and visual disturbance.  Respiratory: Negative for cough, chest tightness, shortness of breath and wheezing.        On oxygen via [place]  Cardiovascular: Negative for chest pain, palpitations and leg swelling.  Gastrointestinal: Negative for abdominal distention, abdominal pain, constipation, diarrhea, nausea and vomiting.  Endocrine: Negative for cold intolerance, heat intolerance, polydipsia, polyphagia and polyuria.  Genitourinary: Negative for dysuria, flank pain and urgency.       Incontinent   Musculoskeletal: Positive for gait problem.  Skin: Negative for color change,  pallor, rash and wound.  Neurological: Positive for weakness. Negative for dizziness, syncope, light-headedness and headaches.  Hematological: Does not bruise/bleed easily.  Psychiatric/Behavioral: Negative for agitation and sleep disturbance. The patient is not nervous/anxious.     Immunization History  Administered Date(s) Administered  . Influenza Whole 09/23/2010, 09/23/2012  . Influenza,inj,Quad PF,6+ Mos 10/09/2014  . Influenza-Unspecified 09/10/2013, 09/30/2015, 10/05/2016, 10/03/2017  . PPD Test 05/23/2015  . Pneumococcal Conjugate-13 04/27/2017  . Pneumococcal Polysaccharide-23 03/31/2009  . Td 03/31/2009   Pertinent  Health Maintenance Due  Topic Date Due  . FOOT EXAM  12/28/2017  . HEMOGLOBIN A1C  04/27/2018  . OPHTHALMOLOGY EXAM  06/12/2018  . URINE MICROALBUMIN  07/05/2018  . INFLUENZA VACCINE  Completed  . PNA vac Low Risk Adult  Completed   Fall Risk  07/26/2017 09/26/2015 05/26/2015 04/29/2015 04/04/2015  Falls in the past year? _0   Number falls in past yr: _1 Injury with Fall? No No Yes Yes Yes  Risk Factor Category  - - High Fall Risk - High Fall Risk  Risk for fall due to : - Impaired balance/gait History of fall(s);Impaired balance/gait;Impaired mobility - History of fall(s);Impaired balance/gait;Impaired mobility  Follow up - Falls evaluation completed Falls evaluation completed - Falls evaluation completed   Functional Status Survey:    Vitals:   12/06/17 1319  BP: 125/76  Pulse: 73  Resp: 18  Temp: (!) 96.9 F (36.1 C)  TempSrc: Oral  SpO2: 98%  Weight: 132 lb 6.4 oz (60.1 kg)  Height: _2  (1.778 m)   Body mass index is 19 kg/m. Physical Exam  Constitutional:  Thin frail Elderly in no acute distress   HENT:  Head: Normocephalic.  Right Ear: External ear normal.  Left Ear: External ear normal.  Mouth/Throat: Oropharynx is clear and moist. No oropharyngeal exudate.  Eyes: Conjunctivae and EOM are normal. Pupils are  equal, round, and reactive to light. Right eye exhibits no discharge. Left eye exhibits no discharge. No scleral icterus.  Neck: Normal range of motion. No JVD present. No thyromegaly present.  Cardiovascular: Exam reveals no gallop and no friction rub.  Murmur heard. Pace maker   Pulmonary/Chest: Effort normal and breath sounds normal. No respiratory distress. He has no wheezes. He has no rales.  Oxygen 2 liters via Nasal cannula in place   Abdominal: Soft. Bowel sounds are normal. He exhibits no distension. There is no tenderness. There is no rebound and no guarding.  Genitourinary:  Genitourinary Comments: Incontinent for both bowel and bladder  Musculoskeletal: He exhibits no edema or tenderness.  Unsteady gait spends most time now in bed.   Lymphadenopathy:    He has no cervical adenopathy.  Neurological: Coordination normal.  Alert and oriented to person  Skin: Skin is warm and dry. No rash noted. No erythema.  Psychiatric: He has a normal mood and affect.    Labs reviewed: Recent Labs    04/11/17 07/29/17 08/05/17  NA 134* 144 138  K 4.4 3.6 3.8  BUN 57* 33* 28*  CREATININE 1.3 1.2 0.8   Recent Labs    03/29/17 07/29/17 08/05/17  AST _0 ALT 8* 7* 8*  ALKPHOS 112 140* 141*   Recent Labs    03/29/17 07/29/17 08/05/17  WBC 6.4 6.0 5.4  HGB 13.8 12.5* 11.9*  HCT 41 38* 36*  PLT 132* 162 175   Lab Results  Component Value Date   TSH 3.12 10/03/2017   Lab Results  Component Value Date   HGBA1C 5.7 10/28/2017   Lab Results  Component Value Date   CHOL 132 02/07/2015   HDL 33 (A) 02/07/2015   LDLCALC 76 02/07/2015   LDLDIRECT 85.6 07/19/2010   TRIG 115 02/07/2015   CHOLHDL 4 03/06/2011    Significant Diagnostic Results in last 30 days:  No results found.  Assessment/Plan 1. Chronic combined systolic and diastolic CHF (congestive heart failure) Appears compensated.No recent abrupt weight gain or edema.continue on furosemide 20 mg tablet  daily.Continue to monitor.     2. Hypothyroidism Lab Results  Component Value Date   TSH 3.12 10/03/2017  Continue on levothyroxine 25 mcg tablet daily.monitor TSH level.   3. Alzheimer's dementia with behavioral disturbance, unspecified timing of dementia onset Psychotic behaviors controlled with risperidone 0.25 mg tablet every other day.Pharmacy recommends discontinuing risperidone 0.25 mg tablet but patient has failed GDR in the past with noted psychotic behaviors.I feel tapering risperidone 0.25 mg tablet might be more harmful to patient.continue supportive care.    4. Failure to thrive in adult Progressive weight loss:   08/27/2017 wt 142.2 lbs 09/29/2017 wt 136.2 lbs 10/30/2017 wt 138.4 lbs 11/29/2017 wt 132.4 lbs.  Continue to encourage oral intake and protein supplements.continue to monitor.   Family/ staff Communication: Reviewed plan of care with patient and facility Nurse.   Labs/tests ordered: None   Tinya Cadogan C Lynel Forester, NP

## 2017-12-13 LAB — CUP PACEART REMOTE DEVICE CHECK
Battery Remaining Longevity: 108 mo
Battery Remaining Percentage: 81 %
Brady Statistic AP VP Percent: 3.7 %
Brady Statistic AP VS Percent: 3.6 %
Brady Statistic AS VP Percent: 54 %
Brady Statistic AS VS Percent: 34 %
Implantable Lead Implant Date: 20120326
Implantable Lead Location: 753859
Lead Channel Impedance Value: 400 Ohm
Lead Channel Impedance Value: 450 Ohm
Lead Channel Pacing Threshold Amplitude: 0.75 V
Lead Channel Pacing Threshold Pulse Width: 0.4 ms
Lead Channel Sensing Intrinsic Amplitude: 2.1 mV
Lead Channel Setting Pacing Amplitude: 1 V
Lead Channel Setting Pacing Pulse Width: 0.4 ms
MDC IDC LEAD IMPLANT DT: 20120326
MDC IDC LEAD LOCATION: 753860
MDC IDC MSMT BATTERY VOLTAGE: 2.93 V
MDC IDC MSMT LEADCHNL RA PACING THRESHOLD AMPLITUDE: 0.5 V
MDC IDC MSMT LEADCHNL RA PACING THRESHOLD PULSEWIDTH: 0.4 ms
MDC IDC MSMT LEADCHNL RV SENSING INTR AMPL: 10.3 mV
MDC IDC PG IMPLANT DT: 20120326
MDC IDC PG SERIAL: 7228315
MDC IDC SESS DTM: 20181210081934
MDC IDC SET LEADCHNL RA PACING AMPLITUDE: 2 V
MDC IDC SET LEADCHNL RV SENSING SENSITIVITY: 2 mV
MDC IDC STAT BRADY RA PERCENT PACED: 4.4 %
MDC IDC STAT BRADY RV PERCENT PACED: 58 %

## 2017-12-18 ENCOUNTER — Non-Acute Institutional Stay (SKILLED_NURSING_FACILITY): Payer: Medicare Other | Admitting: Family

## 2017-12-18 ENCOUNTER — Encounter: Payer: Self-pay | Admitting: Family

## 2017-12-18 DIAGNOSIS — S41112A Laceration without foreign body of left upper arm, initial encounter: Secondary | ICD-10-CM

## 2017-12-18 DIAGNOSIS — S41111A Laceration without foreign body of right upper arm, initial encounter: Secondary | ICD-10-CM | POA: Diagnosis not present

## 2017-12-18 DIAGNOSIS — Y92129 Unspecified place in nursing home as the place of occurrence of the external cause: Secondary | ICD-10-CM | POA: Diagnosis not present

## 2017-12-18 DIAGNOSIS — W19XXXA Unspecified fall, initial encounter: Secondary | ICD-10-CM

## 2017-12-18 DIAGNOSIS — R2681 Unsteadiness on feet: Secondary | ICD-10-CM | POA: Diagnosis not present

## 2017-12-18 NOTE — Progress Notes (Signed)
Location:  [facility] Room Number: 9 Place of Service:  SNF ([phone]) Provider: Keyly Baldonado FNP-C  Blanchie Serve, MD  Patient Care Team: Blanchie Serve, MD as PCP - General (Internal Medicine) Irine Seal, MD as Consulting Physician (Urology) Verl Blalock, Marijo Conception, MD (Inactive) as Consulting Physician (Cardiology) [place] C[facility]shaw, Denice Bors, MD as Consulting Physician (Cardiology) Rigoberto Noel, MD as Consulting Physician (Pulmonary Disease) Mechel Haggard, Nelda Bucks, NP as Nurse Practitioner (Family Medicine)  Extended Emergency Contact Information Primary Emergency Contact: Kathline Magic, [place] of [place] Phone: [phone] Relation: Daughter Secondary Emergency Contact: Johnnette Gourd [place] of [place] Phone: [phone] Mobile Phone: [phone] Relation: Son  Code Status:  DNR Goals of care: Advanced Directive information Advanced Directives 12/18/2017  Does Patient Have a Medical Advance Directive? Yes  Type of Advance Directive Out of facility DNR (pink MOST or yellow form);Healthcare Power of Attorney  Does patient want to make changes to medical advance directive? -  Copy of [place] in Chart? Yes  Pre-existing out of facility DNR order (yellow form or pink MOST form) Pink MOST form placed in chart (order not valid for inpatient use);Yellow form placed in chart (order not valid for inpatient use)     Chief Complaint  Patient presents with  . Acute Visit    fall x2 with skin tear to arm    HPI:  Pt is a 83 y.o. male seen today at [facility] for an acute visit for evaluation of fall episode. He is seen in his room today with seater at bedside.he denies any acute issues this visit.Facility Nurse reports patient's alarm was going off and staff went to check it and found resident on the floor on the other side of the bed. Bed was at lowest level. Resident stated he was  turning in the bed and rolled out of bed.He denies hitting head. No fever,chills,cough or urinary tract infection symptoms reported. He continues to follow up with hospice service.     Past Medical History:  Diagnosis Date  . ANEMIA-IRON DEFICIENCY 05/27/2007  . ANXIETY 11/14/2007  . BENIGN PROSTATIC HYPERTROPHY 11/14/2007  . CAROTID ARTERY DISEASE 10/02/2010  . Chronic combined systolic and diastolic CHF (congestive heart failure) ([place]) 10/10/2014  . CKD stage 3 due to type 1 diabetes mellitus ([place]) 03/17/2015  . COLONIC POLYPS, HX OF 05/27/2007  . CORONARY ARTERY DISEASE 11/14/2007   prior bypass [phone]; Inferior MI 11/07; Severe 3 vessel disease; occluded SVG to RCA; SVG to OM subtotalled, SVG to diagonal with ostial 70 and 80 mid; EF 45. PCI of SVG to OM. FU myovue showed EF 56 with inferior infarct and very mild peri-infarct ischemia  . DEPRESSION 11/14/2007  . DIABETES MELLITUS, TYPE II 11/14/2007  . DIVERTICULITIS, HX OF 05/27/2007  . DM (diabetes mellitus), type 2 with renal complications ([place]) 0/30/0923  . DVT (deep venous thrombosis) ([place]) 03/21/2011  . Dyspnea 11/09/2014  . Effusion of left knee 03/17/2015  . Failure to thrive in adult 05/03/2015  . FOOT PAIN, BILATERAL 12/08/2010  . Generalized weakness 05/20/2015  . GERD 11/14/2007  . Hearing loss 05/13/2009   Qualifier: Diagnosis of  By: Jenny Reichmann MD, Hunt Oris   . Hyperlipemia 11/14/2007   Qualifier: Diagnosis of  By: Jenny Reichmann MD, Hunt Oris    . HYPERLIPIDEMIA 11/14/2007  . HYPERTENSION 11/14/2007  . HYPOTENSION, ORTHOSTATIC 03/06/2011  . Intermittent high-grade heart block  09/05/2009   s/p pacemaker  . Ischemic cardiomyopathy    a. echo (10/15):  mod focal basal septal hypertrophy, EF 40-45%, inf AK, Gr 1 DD, Al sclerosis, mild AI, MAC, mild MR, mod LAE, lipomatous hypertrophy, small effusion (no hemodynamic compromise)   . Memory loss 06/01/2008  . Myalgia and myositis, unspecified 04/24/2011  . Osteoarthrosis, unspecified whether  generalized or localized, unspecified site   . OSTEOPOROSIS 06/02/2010  . Other malaise and fatigue 04/24/2011  . Pacemaker-St. Jude 02/02/2008   St. Jude  . Peripheral neuropathy 03/27/2011  . PERIPHERAL VASCULAR DISEASE 11/14/2007  . Prostate cancer ([place])   . Protein-calorie malnutrition, severe ([place]) 05/23/2015  . RENAL INSUFFICIENCY 11/14/2007  . Seborrheic keratosis 06/05/2011  . SECONDARY DM W/PERIPHERAL CIRC D/O UNCONTROLLED 11/07/2010  . Severe back pain   . SYNCOPE 09/08/2008  . Unspecified hearing loss 05/13/2009  . Vertebral compression fracture ([place]) 09/05/2009   Qualifier: Diagnosis of  By: Jenny Reichmann MD, Hunt Oris   . [name] 04/04/2015  . Weight loss 10/10/2014   Past Surgical History:  Procedure Laterality Date  . APPENDECTOMY    . CHOLECYSTECTOMY    . CORONARY ARTERY BYPASS GRAFT  2007  . CYSTOSCOPY    . PACEMAKER INSERTION  2012   Caryl Comes, MD  . prostate needle biopsy    . s/p bilat knee replacement  1992   Paul, MD  . s/p coronary stent x 1      Allergies  Allergen Reactions  . Hydrocodone     hallucination  . Tramadol     hallucination    Outpatient Encounter Medications as of 12/18/2017  Medication Sig  . ALPRAZolam (XANAX) 0.25 MG tablet Take 0.25 mg by mouth 2 (two) times daily as needed for anxiety.  . bisacodyl (DULCOLAX) 10 MG suppository Place 10 mg every three (3) days as needed rectally for moderate constipation.  . carbamide peroxide (DEBROX) 6.5 % otic solution 5 drops. Instil 5 - 10 drops in each ear at bedtime 3 times a week every other month  . fentaNYL (DURAGESIC - DOSED MCG/HR) 12 MCG/HR Place 12.5 mcg onto the skin every 3 (three) days.  . furosemide (LASIX) 20 MG tablet Take 20 mg daily by mouth.   . levothyroxine (SYNTHROID, LEVOTHROID) 25 MCG tablet Take 25 mcg by mouth daily.  . nitroGLYCERIN (NITROSTAT) 0.4 MG SL tablet Place 0.4 mg under the tongue every 5 (five) minutes as needed for chest pain.  . Nutritional Supplements (BOOST DIABETIC) LIQD Take  237 mLs by mouth 2 (two) times daily. May also have one at [facility] as needed  . OXYGEN Inhale 1 L/min into the lungs as needed.   Marland Kitchen Propylene Glycol (SYSTANE BALANCE) 0.6 % SOLN Apply to eye. One drop both eyes 2 times a day due to dry eyes  . risperiDONE (RISPERDAL) 0.25 MG tablet Take 0.25 mg by mouth every other day.  . zinc oxide 20 % ointment Apply 1 application topically. Apply ointment twice daily as needed to buttocks , apply to boney prominences mid spine three times daily as needed  . [DISCONTINUED] MORPHINE SULFATE, CONCENTRATE, PO Take 0.25 mLs by mouth every 8 (eight) hours as needed.   No facility-administered encounter medications on file as of 12/18/2017.     Review of Systems  Constitutional: Negative for activity change, chills, fatigue and fever.  HENT: Positive for hearing loss. Negative for congestion, rhinorrhea, sinus pressure, sinus pain, sneezing and sore throat.   Eyes: Positive for visual disturbance. Negative for discharge  and redness.       Wears eye glasses   Respiratory: Negative for cough, chest tightness, shortness of breath and wheezing.   Cardiovascular: Negative for chest pain, palpitations and leg swelling.  Gastrointestinal: Negative for abdominal distention, abdominal pain, constipation, diarrhea, nausea and vomiting.  Genitourinary: Negative for dysuria, flank pain, frequency and urgency.  Musculoskeletal: Positive for gait problem.  Skin: Negative for color change, pallor, rash and wound.  Neurological: Negative for dizziness, light-headedness and headaches.       Generalized weakness   Hematological: Does not bruise/bleed easily.  Psychiatric/Behavioral: Negative for agitation and sleep disturbance. The patient is not nervous/anxious.     Immunization History  Administered Date(s) Administered  . Influenza Whole 09/23/2010, 09/23/2012  . Influenza,inj,Quad PF,6+ Mos 10/09/2014  . Influenza-Unspecified 09/10/2013, 09/30/2015, 10/05/2016, 10/03/2017    . PPD Test 05/23/2015  . Pneumococcal Conjugate-13 04/27/2017  . Pneumococcal Polysaccharide-23 03/31/2009  . Td 03/31/2009   Pertinent  Health Maintenance Due  Topic Date Due  . FOOT EXAM  12/28/2017  . HEMOGLOBIN A1C  04/27/2018  . OPHTHALMOLOGY EXAM  06/12/2018  . URINE MICROALBUMIN  07/05/2018  . INFLUENZA VACCINE  Completed  . PNA vac Low Risk Adult  Completed   Fall Risk  07/26/2017 09/26/2015 05/26/2015 04/29/2015 04/04/2015  Falls in the past year? _0   Number falls in past yr: _1 Injury with Fall? No No Yes Yes Yes  Risk Factor Category  - - High Fall Risk - High Fall Risk  Risk for fall due to : - Impaired balance/gait History of fall(s);Impaired balance/gait;Impaired mobility - History of fall(s);Impaired balance/gait;Impaired mobility  Follow up - Falls evaluation completed Falls evaluation completed - Falls evaluation completed   Functional Status Survey:    Vitals:   12/18/17 1223  BP: (!) 100/59  Pulse: 65  Resp: 15  Temp: (!) 97 F (36.1 C)  SpO2: 94%  Weight: 132 lb 6.4 oz (60.1 kg)  Height: _2  (1.778 m)   Body mass index is 19 kg/m. Physical Exam  Constitutional:  Thin frail elderly in no acute distress  HENT:  Head: Normocephalic.  Mouth/Throat: Oropharynx is clear and moist. No oropharyngeal exudate.  Hearing aid in place   Eyes: Conjunctivae and EOM are normal. Pupils are equal, round, and reactive to light. Right eye exhibits no discharge. Left eye exhibits no discharge. No scleral icterus.  Eye glasses in place   Neck: Normal range of motion. No JVD present. No thyromegaly present.  Cardiovascular: Intact distal pulses. Exam reveals no gallop and no friction rub.  Murmur heard. Pulmonary/Chest: Effort normal and breath sounds normal. No respiratory distress. He has no wheezes. He has no rales.  Oxygen via nasal cannula in place   Abdominal: Soft. Bowel sounds are normal. He exhibits no distension. There is no tenderness.  There is no rebound and no guarding.  Genitourinary:  Genitourinary Comments: Incontinent   Musculoskeletal: He exhibits no edema or tenderness.  Unsteady gait uses FWW with assistance. Moves x 4 extremities without any difficulties.  Lymphadenopathy:    He has no cervical adenopathy.  Neurological: He is alert. Coordination normal.  A &O to person   Skin: Skin is warm and dry. No rash noted.  1. Right upper arm skin tear x 2 without any signs of infections.surrounding skin tissues purple in color non-tender to touch.  2. Left shoulder/arm skin tear with steri-strips in place surrounding skin tissue purple bruise.  Psychiatric: He has a normal mood and affect.    Labs reviewed: Recent Labs    04/11/17 07/29/17 08/05/17  NA 134* 144 138  K 4.4 3.6 3.8  BUN 57* 33* 28*  CREATININE 1.3 1.2 0.8   Recent Labs    03/29/17 07/29/17 08/05/17  AST _0 ALT 8* 7* 8*  ALKPHOS 112 140* 141*   Recent Labs    03/29/17 07/29/17 08/05/17  WBC 6.4 6.0 5.4  HGB 13.8 12.5* 11.9*  HCT 41 38* 36*  PLT 132* 162 175   Lab Results  Component Value Date   TSH 3.12 10/03/2017   Lab Results  Component Value Date   HGBA1C 5.7 10/28/2017   Lab Results  Component Value Date   CHOL 132 02/07/2015   HDL 33 (A) 02/07/2015   LDLCALC 76 02/07/2015   LDLDIRECT 85.6 07/19/2010   TRIG 115 02/07/2015   CHOLHDL 4 03/06/2011    Significant Diagnostic Results in last 30 days:  No results found.  Assessment/Plan 1. Unsteady gait Uses FWW with assistance.Safety awareness limited due to dementia.continue with fall and safety precautions.continue on Hospice service.   2. Skin tear of right upper arm without complication, initial encounter  Afebrile.Right upper arm skin tear x 2 without any signs of infections.surrounding skin tissues purple in color non-tender to touch.continue to cleanse skin tear with saline,pat dry and apply TAO and cover with non adhesive gauze. Monitor for any signs of  infections.   3. Skin tear of left upper arm without complication, initial encounter Left shoulder/arm skin tear with steri-strips in place surrounding skin tissue purple bruise.continue to cover with none adhesive gauze and monitor for sighs of infections.   4. Fall at nursing home, initial encounter Was found on the floor states rolled off the bed. Moves extremities without any difficulties.Skin tear sustained as above. Will continue to monitor for now. Continue Fall and safety precautions.Seater at bedside.   Family/ staff Communication: Reviewed plan of care with patient and facility Nurse.  Labs/tests ordered: None   Conn Trombetta C Kinta Martis, NP

## 2018-01-08 ENCOUNTER — Encounter: Payer: Self-pay | Admitting: Internal Medicine

## 2018-01-08 ENCOUNTER — Non-Acute Institutional Stay (SKILLED_NURSING_FACILITY): Payer: Medicare Other | Admitting: Internal Medicine

## 2018-01-08 DIAGNOSIS — E43 Unspecified severe protein-calorie malnutrition: Secondary | ICD-10-CM | POA: Diagnosis not present

## 2018-01-08 DIAGNOSIS — G8929 Other chronic pain: Secondary | ICD-10-CM | POA: Diagnosis not present

## 2018-01-08 DIAGNOSIS — F0281 Dementia in other diseases classified elsewhere with behavioral disturbance: Secondary | ICD-10-CM

## 2018-01-08 DIAGNOSIS — G309 Alzheimer's disease, unspecified: Secondary | ICD-10-CM | POA: Diagnosis not present

## 2018-01-08 DIAGNOSIS — I5042 Chronic combined systolic (congestive) and diastolic (congestive) heart failure: Secondary | ICD-10-CM | POA: Diagnosis not present

## 2018-01-08 DIAGNOSIS — M545 Low back pain: Secondary | ICD-10-CM

## 2018-01-08 NOTE — Progress Notes (Signed)
Location:  [facility] Room Number: 9 Place of Service:  SNF [phone]) Provider:  Blanchie Serve MD  Blanchie Serve, MD  Patient Care Team: Blanchie Serve, MD as PCP - General (Internal Medicine) Irine Seal, MD as Consulting Physician (Urology) Verl Blalock, Marijo Conception, MD (Inactive) as Consulting Physician (Cardiology) [place] C[facility]shaw, Denice Bors, MD as Consulting Physician (Cardiology) Rigoberto Noel, MD as Consulting Physician (Pulmonary Disease) [place], Nelda Bucks, NP as Nurse Practitioner (Family Medicine)  Extended Emergency Contact Information Primary Emergency Contact: Kathline Magic, [place] of [place] Phone: [phone] Relation: Daughter Secondary Emergency Contact: Johnnette Gourd [place] of [place] Phone: [phone] Mobile Phone: [phone] Relation: Son  Code Status:  DNR  Goals of care: Advanced Directive information Advanced Directives 01/08/2018  Does Patient Have a Medical Advance Directive? Yes  Type of Advance Directive Out of facility DNR (pink MOST or yellow form)  Does patient want to make changes to medical advance directive? No - Patient declined  Copy of [place] in Chart? -  Pre-existing out of facility DNR order (yellow form or pink MOST form) Yellow form placed in chart (order not valid for inpatient use);Pink MOST form placed in chart (order not valid for inpatient use)     Chief Complaint  Patient presents with  . Medical Management of Chronic Issues    Routine Visit     HPI:  Pt is a 84 y.o. male seen today for medical management of chronic diseases. He is under hospice services. He appears comfortable. He has cognitive impairment. He had a fall 12/14/17 with skin tears and bruising. Mood overall stable on risperidone. Does not participate in HPI and ROS.    Past Medical History:  Diagnosis Date  . ANEMIA-IRON DEFICIENCY 05/27/2007  . ANXIETY 11/14/2007  .  BENIGN PROSTATIC HYPERTROPHY 11/14/2007  . CAROTID ARTERY DISEASE 10/02/2010  . Chronic combined systolic and diastolic CHF (congestive heart failure) ([place]) 10/10/2014  . CKD stage 3 due to type 1 diabetes mellitus ([place]) 03/17/2015  . COLONIC POLYPS, HX OF 05/27/2007  . CORONARY ARTERY DISEASE 11/14/2007   prior bypass [phone]; Inferior MI 11/07; Severe 3 vessel disease; occluded SVG to RCA; SVG to OM subtotalled, SVG to diagonal with ostial 70 and 80 mid; EF 45. PCI of SVG to OM. FU myovue showed EF 56 with inferior infarct and very mild peri-infarct ischemia  . DEPRESSION 11/14/2007  . DIABETES MELLITUS, TYPE II 11/14/2007  . DIVERTICULITIS, HX OF 05/27/2007  . DM (diabetes mellitus), type 2 with renal complications ([place]) 06/20/3150  . DVT (deep venous thrombosis) ([place]) 03/21/2011  . Dyspnea 11/09/2014  . Effusion of left knee 03/17/2015  . Failure to thrive in adult 05/03/2015  . FOOT PAIN, BILATERAL 12/08/2010  . Generalized weakness 05/20/2015  . GERD 11/14/2007  . Hearing loss 05/13/2009   Qualifier: Diagnosis of  By: Jenny Reichmann MD, Hunt Oris   . Hyperlipemia 11/14/2007   Qualifier: Diagnosis of  By: Jenny Reichmann MD, Hunt Oris    . HYPERLIPIDEMIA 11/14/2007  . HYPERTENSION 11/14/2007  . HYPOTENSION, ORTHOSTATIC 03/06/2011  . Intermittent high-grade heart block 09/05/2009   s/p pacemaker  . Ischemic cardiomyopathy    a. echo (10/15):  mod focal basal septal hypertrophy, EF 40-45%, inf AK, Gr 1 DD, Al sclerosis, mild AI, MAC, mild MR, mod LAE, lipomatous hypertrophy, small effusion (no hemodynamic compromise)   . Memory loss 06/01/2008  . Myalgia  and myositis, unspecified 04/24/2011  . Osteoarthrosis, unspecified whether generalized or localized, unspecified site   . OSTEOPOROSIS 06/02/2010  . Other malaise and fatigue 04/24/2011  . Pacemaker-St. Jude 02/02/2008   St. Jude  . Peripheral neuropathy 03/27/2011  . PERIPHERAL VASCULAR DISEASE 11/14/2007  . Prostate cancer ([place])   . Protein-calorie malnutrition,  severe ([place]) 05/23/2015  . RENAL INSUFFICIENCY 11/14/2007  . Seborrheic keratosis 06/05/2011  . SECONDARY DM W/PERIPHERAL CIRC D/O UNCONTROLLED 11/07/2010  . Severe back pain   . SYNCOPE 09/08/2008  . Unspecified hearing loss 05/13/2009  . Vertebral compression fracture ([place]) 09/05/2009   Qualifier: Diagnosis of  By: Jenny Reichmann MD, Hunt Oris   . [name] 04/04/2015  . Weight loss 10/10/2014   Past Surgical History:  Procedure Laterality Date  . APPENDECTOMY    . CHOLECYSTECTOMY    . CORONARY ARTERY BYPASS GRAFT  2007  . CYSTOSCOPY    . PACEMAKER INSERTION  2012   Caryl Comes, MD  . prostate needle biopsy    . s/p bilat knee replacement  1992   Paul, MD  . s/p coronary stent x 1      Allergies  Allergen Reactions  . Hydrocodone     hallucination  . Tramadol     hallucination    Outpatient Encounter Medications as of 01/08/2018  Medication Sig  . ALPRAZolam (XANAX) 0.25 MG tablet Take 0.25 mg by mouth 2 (two) times daily as needed for anxiety.  . bisacodyl (DULCOLAX) 10 MG suppository Place 10 mg every three (3) days as needed rectally for moderate constipation.  . carbamide peroxide (DEBROX) 6.5 % otic solution 5 drops. Instil 5 - 10 drops in each ear at bedtime 3 times a week every other month  . furosemide (LASIX) 20 MG tablet Take 20 mg daily by mouth.   . levothyroxine (SYNTHROID, LEVOTHROID) 25 MCG tablet Take 25 mcg by mouth daily.  Marland Kitchen morphine 20 MG/5ML solution Take 5 mg by mouth every 8 (eight) hours as needed (severe pain or dyspnea).  . nitroGLYCERIN (NITROSTAT) 0.4 MG SL tablet Place 0.4 mg under the tongue every 5 (five) minutes as needed for chest pain.  . Nutritional Supplements (BOOST DIABETIC) LIQD Take 237 mLs by mouth 2 (two) times daily. May also have one at [facility] as needed  . OXYGEN Inhale 1 L/min into the lungs as needed.   Marland Kitchen Propylene Glycol (SYSTANE BALANCE) 0.6 % SOLN Apply to eye. One drop both eyes 2 times a day due to dry eyes  . risperiDONE (RISPERDAL) 0.25 MG tablet  Take 0.25 mg by mouth every other day.  Marland Kitchen UNABLE TO FIND Med Name: Fentanyl Patch 12 mcg. 1 patch every days  . [DISCONTINUED] fentaNYL (DURAGESIC - DOSED MCG/HR) 12 MCG/HR Place 12.5 mcg onto the skin every 3 (three) days.  . [DISCONTINUED] zinc oxide 20 % ointment Apply 1 application topically. Apply ointment twice daily as needed to buttocks , apply to boney prominences mid spine three times daily as needed   No facility-administered encounter medications on file as of 01/08/2018.     Review of Systems  Unable to perform ROS: Dementia    Immunization History  Administered Date(s) Administered  . Influenza Whole 09/23/2010, 09/23/2012  . Influenza,inj,Quad PF,6+ Mos 10/09/2014  . Influenza-Unspecified 09/10/2013, 09/30/2015, 10/05/2016, 10/03/2017  . PPD Test 05/23/2015  . Pneumococcal Conjugate-13 04/27/2017  . Pneumococcal Polysaccharide-23 03/31/2009  . Td 03/31/2009   Pertinent  Health Maintenance Due  Topic Date Due  . FOOT EXAM  12/28/2017  .  HEMOGLOBIN A1C  04/27/2018  . OPHTHALMOLOGY EXAM  06/12/2018  . URINE MICROALBUMIN  07/05/2018  . INFLUENZA VACCINE  Completed  . PNA vac Low Risk Adult  Completed   Fall Risk  07/26/2017 09/26/2015 05/26/2015 04/29/2015 04/04/2015  Falls in the past year? _0   Number falls in past yr: _1 Injury with Fall? No No Yes Yes Yes  Risk Factor Category  - - High Fall Risk - High Fall Risk  Risk for fall due to : - Impaired balance/gait History of fall(s);Impaired balance/gait;Impaired mobility - History of fall(s);Impaired balance/gait;Impaired mobility  Follow up - Falls evaluation completed Falls evaluation completed - Falls evaluation completed   Functional Status Survey:    Vitals:   01/08/18 1459  BP: (!) 111/57  Pulse: 65  Resp: 16  Temp: 97.6 F (36.4 C)  TempSrc: Oral  SpO2: 94%  Weight: 133 lb 3.2 oz (60.4 kg)  Height: _2  (1.778 m)   Body mass index is 19.11 kg/m.   Wt Readings from Last 3  Encounters:  01/08/18 133 lb 3.2 oz (60.4 kg)  12/18/17 132 lb 6.4 oz (60.1 kg)  12/06/17 132 lb 6.4 oz (60.1 kg)   Physical Exam  Constitutional:  Frail, thin built, elderly male in no acute distress  HENT:  Head: Normocephalic and atraumatic.  Mouth/Throat: Oropharynx is clear and moist.  Eyes: Conjunctivae and EOM are normal. Pupils are equal, round, and reactive to light. Right eye exhibits no discharge. Left eye exhibits no discharge.  Neck: Neck supple.  Cardiovascular: Normal rate and regular rhythm.  Murmur heard. pacemaker  Pulmonary/Chest: Effort normal and breath sounds normal. No respiratory distress.  Abdominal: Soft. Bowel sounds are normal. There is no tenderness.  Musculoskeletal: He exhibits deformity. He exhibits no edema.  Able to move all 4 extremities, unsteady gait  Lymphadenopathy:    He has no cervical adenopathy.  Neurological: He is alert.  Oriented to self only  Skin: Skin is warm and dry. No rash noted. He is not diaphoretic.  Bruise to both his arms  Psychiatric: He has a normal mood and affect.    Labs reviewed: Recent Labs    04/11/17 07/29/17 08/05/17  NA 134* 144 138  K 4.4 3.6 3.8  BUN 57* 33* 28*  CREATININE 1.3 1.2 0.8   Recent Labs    03/29/17 07/29/17 08/05/17  AST _3 ALT 8* 7* 8*  ALKPHOS 112 140* 141*   Recent Labs    03/29/17 07/29/17 08/05/17  WBC 6.4 6.0 5.4  HGB 13.8 12.5* 11.9*  HCT 41 38* 36*  PLT 132* 162 175   Lab Results  Component Value Date   TSH 3.12 10/03/2017   Lab Results  Component Value Date   HGBA1C 5.7 10/28/2017   Lab Results  Component Value Date   CHOL 132 02/07/2015   HDL 33 (A) 02/07/2015   LDLCALC 76 02/07/2015   LDLDIRECT 85.6 07/19/2010   TRIG 115 02/07/2015   CHOLHDL 4 03/06/2011    Significant Diagnostic Results in last 30 days:  No results found.  Assessment/Plan  Chronic CHF Appears euvolemic. Does not require diuresis, daughter/HCPOA does not want this changed for  now. Monitor clinically with history of CKD 3. On o2 by nasal canula but not using it, breathing stable  Protein calorie malnutrition Continue nutritional supplement.   Dementia with behavioral disturbance Behavioral overall calm. Continue risperidone daily. Continue alprazolam 0.25 mg bid prn.  Chronic pain In no distress. Continue morphine 44m q8h prn and fentanyl patch. Has not required any morphine in last 2 weeks.    Family/ staff Communication: reviewed care plan with patient and charge nurse.    Labs/tests ordered:  none   MBlanchie Serve MD Internal Medicine PNorth [facility] HospitalGroup [address] [id number] Phone (Monday-Friday 8 am - 5 pm): [phone] Call: [phone] follow prompts after 5 pm and on weekends Office Phone: [phone] Fax: [phone]

## 2018-01-20 ENCOUNTER — Non-Acute Institutional Stay (SKILLED_NURSING_FACILITY): Payer: Medicare Other | Admitting: Family

## 2018-01-20 ENCOUNTER — Encounter: Payer: Self-pay | Admitting: Family

## 2018-01-20 DIAGNOSIS — R05 Cough: Secondary | ICD-10-CM | POA: Diagnosis not present

## 2018-01-20 DIAGNOSIS — R058 Other specified cough: Secondary | ICD-10-CM

## 2018-01-20 NOTE — Progress Notes (Signed)
Location:  [place] Room Number: 9 Place of Service:  SNF ([phone]) Provider: Aarti Mankowski FNP-C  Blanchie Serve, MD  Patient Care Team: Blanchie Serve, MD as PCP - General (Internal Medicine) Irine Seal, MD as Consulting Physician (Urology) Verl Blalock, Marijo Conception, MD (Inactive) as Consulting Physician (Cardiology) [place], Denice Bors, MD as Consulting Physician (Cardiology) Rigoberto Noel, MD as Consulting Physician (Pulmonary Disease) Zyanna Leisinger, Nelda Bucks, NP as Nurse Practitioner (Family Medicine)  Extended Emergency Contact Information Primary Emergency Contact: Kathline Magic, [place] of [place] Phone: [phone] Relation: Daughter Secondary Emergency Contact: Johnnette Gourd [place] of [place] Phone: [phone] Mobile Phone: [phone] Relation: Son  Code Status:  DNR Goals of care: Advanced Directive information Advanced Directives 01/20/2018  Does Patient Have a Medical Advance Directive? Yes  Type of Paramedic of [place];Out of facility DNR (pink MOST or yellow form)  Does patient want to make changes to medical advance directive? -  Copy of [place] in Chart? Yes  Pre-existing out of facility DNR order (yellow form or pink MOST form) Pink MOST form placed in chart (order not valid for inpatient use);Yellow form placed in chart (order not valid for inpatient use)     Chief Complaint  Patient presents with  . Acute Visit    cough    HPI:  Pt is a 84 y.o. male seen today at [facility] for an acute visit for evaluation of non-productive cough.He is seen in his room today with care giver at bedside.Care giver states patient has been more confused refusing a bath to be provided by facility CNA and states " You're fired" to everyone that attempts to assist with his ADL's. Facility Nurse reports no fever,chills or shortness of breath. HPI and  ROS limited during visit due to patient's cognitive impairment.    Past Medical History:  Diagnosis Date  . ANEMIA-IRON DEFICIENCY 05/27/2007  . ANXIETY 11/14/2007  . BENIGN PROSTATIC HYPERTROPHY 11/14/2007  . CAROTID ARTERY DISEASE 10/02/2010  . Chronic combined systolic and diastolic CHF (congestive heart failure) ([place]) 10/10/2014  . CKD stage 3 due to type 1 diabetes mellitus ([place]) 03/17/2015  . COLONIC POLYPS, HX OF 05/27/2007  . CORONARY ARTERY DISEASE 11/14/2007   prior bypass [phone]; Inferior MI 11/07; Severe 3 vessel disease; occluded SVG to RCA; SVG to OM subtotalled, SVG to diagonal with ostial 70 and 80 mid; EF 45. PCI of SVG to OM. FU myovue showed EF 56 with inferior infarct and very mild peri-infarct ischemia  . DEPRESSION 11/14/2007  . DIABETES MELLITUS, TYPE II 11/14/2007  . DIVERTICULITIS, HX OF 05/27/2007  . DM (diabetes mellitus), type 2 with renal complications ([place]) 1/76/1607  . DVT (deep venous thrombosis) ([place]) 03/21/2011  . Dyspnea 11/09/2014  . Effusion of left knee 03/17/2015  . Failure to thrive in adult 05/03/2015  . FOOT PAIN, BILATERAL 12/08/2010  . Generalized weakness 05/20/2015  . GERD 11/14/2007  . Hearing loss 05/13/2009   Qualifier: Diagnosis of  By: Jenny Reichmann MD, Hunt Oris   . Hyperlipemia 11/14/2007   Qualifier: Diagnosis of  By: Jenny Reichmann MD, Hunt Oris    . HYPERLIPIDEMIA 11/14/2007  . HYPERTENSION 11/14/2007  . HYPOTENSION, ORTHOSTATIC 03/06/2011  . Intermittent high-grade heart block 09/05/2009   s/p pacemaker  . Ischemic cardiomyopathy    a. echo (10/15):  mod focal basal septal hypertrophy, EF 40-45%, inf AK, Gr  1 DD, Al sclerosis, mild AI, MAC, mild MR, mod LAE, lipomatous hypertrophy, small effusion (no hemodynamic compromise)   . Memory loss 06/01/2008  . Myalgia and myositis, unspecified 04/24/2011  . Osteoarthrosis, unspecified whether generalized or localized, unspecified site   . OSTEOPOROSIS 06/02/2010  . Other malaise and fatigue 04/24/2011  .  Pacemaker-St. Jude 02/02/2008   St. Jude  . Peripheral neuropathy 03/27/2011  . PERIPHERAL VASCULAR DISEASE 11/14/2007  . Prostate cancer ([place])   . Protein-calorie malnutrition, severe ([place]) 05/23/2015  . RENAL INSUFFICIENCY 11/14/2007  . Seborrheic keratosis 06/05/2011  . SECONDARY DM W/PERIPHERAL CIRC D/O UNCONTROLLED 11/07/2010  . Severe back pain   . SYNCOPE 09/08/2008  . Unspecified hearing loss 05/13/2009  . Vertebral compression fracture ([place]) 09/05/2009   Qualifier: Diagnosis of  By: Jenny Reichmann MD, Hunt Oris   . [name] 04/04/2015  . Weight loss 10/10/2014   Past Surgical History:  Procedure Laterality Date  . APPENDECTOMY    . CHOLECYSTECTOMY    . CORONARY ARTERY BYPASS GRAFT  2007  . CYSTOSCOPY    . PACEMAKER INSERTION  2012   Caryl Comes, MD  . prostate needle biopsy    . s/p bilat knee replacement  1992   Paul, MD  . s/p coronary stent x 1      Allergies  Allergen Reactions  . Hydrocodone     hallucination  . Tramadol     hallucination    Outpatient Encounter Medications as of 01/20/2018  Medication Sig  . ALPRAZolam (XANAX) 0.25 MG tablet Take 0.25 mg by mouth 2 (two) times daily as needed for anxiety.  . bisacodyl (DULCOLAX) 10 MG suppository Place 10 mg every three (3) days as needed rectally for moderate constipation.  . carbamide peroxide (DEBROX) 6.5 % otic solution 5 drops. Instil 5 - 10 drops in each ear at bedtime 3 times a week every other month  . furosemide (LASIX) 20 MG tablet Take 20 mg daily by mouth.   . levothyroxine (SYNTHROID, LEVOTHROID) 25 MCG tablet Take 25 mcg by mouth daily.  Marland Kitchen morphine 20 MG/5ML solution Take 5 mg by mouth every 8 (eight) hours as needed (severe pain or dyspnea).  . nitroGLYCERIN (NITROSTAT) 0.4 MG SL tablet Place 0.4 mg under the tongue every 5 (five) minutes as needed for chest pain.  . Nutritional Supplements (BOOST DIABETIC) LIQD Take 237 mLs by mouth 2 (two) times daily. May also have one at [facility] as needed  . OXYGEN Inhale 1 L/min into  the lungs as needed.   Marland Kitchen Propylene Glycol (SYSTANE BALANCE) 0.6 % SOLN Apply to eye. One drop both eyes 2 times a day due to dry eyes  . risperiDONE (RISPERDAL) 0.25 MG tablet Take 0.25 mg by mouth every other day.  Marland Kitchen UNABLE TO FIND Place 1 patch onto the skin every 3 (three) days. Med Name: Fentanyl Patch 12 mcg.   No facility-administered encounter medications on file as of 01/20/2018.     Review of Systems  Unable to perform ROS: Dementia (additional information provided by Nurse and care giver.)    Immunization History  Administered Date(s) Administered  . Influenza Whole 09/23/2010, 09/23/2012  . Influenza,inj,Quad PF,6+ Mos 10/09/2014  . Influenza-Unspecified 09/10/2013, 09/30/2015, 10/05/2016, 10/03/2017  . PPD Test 05/23/2015  . Pneumococcal Conjugate-13 04/27/2017  . Pneumococcal Polysaccharide-23 03/31/2009  . Td 03/31/2009   Pertinent  Health Maintenance Due  Topic Date Due  . FOOT EXAM  12/28/2017  . HEMOGLOBIN A1C  04/27/2018  . OPHTHALMOLOGY EXAM  06/12/2018  .  URINE MICROALBUMIN  07/05/2018  . INFLUENZA VACCINE  Completed  . PNA vac Low Risk Adult  Completed   Fall Risk  07/26/2017 09/26/2015 05/26/2015 04/29/2015 04/04/2015  Falls in the past year? _0   Number falls in past yr: _1 Injury with Fall? No No Yes Yes Yes  Risk Factor Category  - - High Fall Risk - High Fall Risk  Risk for fall due to : - Impaired balance/gait History of fall(s);Impaired balance/gait;Impaired mobility - History of fall(s);Impaired balance/gait;Impaired mobility  Follow up - Falls evaluation completed Falls evaluation completed - Falls evaluation completed    Vitals:   01/20/18 1137  BP: 109/70  Pulse: 69  Resp: (!) 22  Temp: (!) 96.9 F (36.1 C)  SpO2: 97%  Weight: 133 lb 3.2 oz (60.4 kg)  Height: _2  (1.778 m)   Body mass index is 19.11 kg/m. Physical Exam  Constitutional:  Thin frail elderly in no acute distress   HENT:  Head: Normocephalic.    Mouth/Throat: Oropharynx is clear and moist. No oropharyngeal exudate.  Eyes: Conjunctivae are normal. Pupils are equal, round, and reactive to light. Right eye exhibits no discharge. Left eye exhibits no discharge. No scleral icterus.  Neck: Neck supple. No JVD present. No thyromegaly present.  Cardiovascular: Intact distal pulses. Exam reveals no gallop and no friction rub.  Murmur heard. Pulmonary/Chest: Effort normal. No respiratory distress. He has no wheezes.  Right lower lobe rales   Abdominal: Soft. Bowel sounds are normal. He exhibits no distension. There is no tenderness. There is no rebound and no guarding.  Musculoskeletal: He exhibits no edema or tenderness.  Moves x 4 extremities.Unsteady gait   Lymphadenopathy:    He has no cervical adenopathy.  Neurological: He is alert.  Pleasantly confused   Skin: Skin is warm and dry. No rash noted. No erythema.  Psychiatric: He has a normal mood and affect.    Labs reviewed: Recent Labs    04/11/17 07/29/17 08/05/17  NA 134* 144 138  K 4.4 3.6 3.8  BUN 57* 33* 28*  CREATININE 1.3 1.2 0.8   Recent Labs    03/29/17 07/29/17 08/05/17  AST _3 ALT 8* 7* 8*  ALKPHOS 112 140* 141*   Recent Labs    03/29/17 07/29/17 08/05/17  WBC 6.4 6.0 5.4  HGB 13.8 12.5* 11.9*  HCT 41 38* 36*  PLT 132* 162 175   Lab Results  Component Value Date   TSH 3.12 10/03/2017   Lab Results  Component Value Date   HGBA1C 5.7 10/28/2017   Lab Results  Component Value Date   CHOL 132 02/07/2015   HDL 33 (A) 02/07/2015   LDLCALC 76 02/07/2015   LDLDIRECT 85.6 07/19/2010   TRIG 115 02/07/2015   CHOLHDL 4 03/06/2011    Significant Diagnostic Results in last 30 days:  No results found.  Assessment/Plan  Non-productive cough Afebrile.Right lower lobe rales.continue on oxygen 1 liter via nasal cannula. Obtain portable CXR Pa/Lateral rule pneumonia.continue to monitor.    Family/ staff Communication: Reviewed plan of care with  patient and facility Nurse supervisor  Labs/tests ordered:  CXR Pa/Lateral rule pneumonia  Sandrea Hughs, NP

## 2018-02-07 ENCOUNTER — Non-Acute Institutional Stay (SKILLED_NURSING_FACILITY): Payer: Medicare Other | Admitting: Family

## 2018-02-07 ENCOUNTER — Encounter: Payer: Self-pay | Admitting: Family

## 2018-02-07 DIAGNOSIS — E039 Hypothyroidism, unspecified: Secondary | ICD-10-CM

## 2018-02-07 DIAGNOSIS — I5042 Chronic combined systolic (congestive) and diastolic (congestive) heart failure: Secondary | ICD-10-CM | POA: Diagnosis not present

## 2018-02-07 DIAGNOSIS — G309 Alzheimer's disease, unspecified: Secondary | ICD-10-CM | POA: Diagnosis not present

## 2018-02-07 DIAGNOSIS — F0281 Dementia in other diseases classified elsewhere with behavioral disturbance: Secondary | ICD-10-CM | POA: Diagnosis not present

## 2018-02-07 NOTE — Progress Notes (Signed)
Location:  [name] Number: 9 Place of Service:  SNF ([phone]) Provider: Octivia Canion FNP-C   Blanchie Serve, MD  Patient Care Team: Blanchie Serve, MD as PCP - General (Internal Medicine) Irine Seal, MD as Consulting Physician (Urology) Verl Blalock, Marijo Conception, MD (Inactive) as Consulting Physician (Cardiology) [place], Denice Bors, MD as Consulting Physician (Cardiology) Rigoberto Noel, MD as Consulting Physician (Pulmonary Disease) Aniya Jolicoeur, Nelda Bucks, NP as Nurse Practitioner (Family Medicine)  Extended Emergency Contact Information Primary Emergency Contact: Kathline Magic, [place] of [place] Phone: [phone] Relation: Daughter Secondary Emergency Contact: Johnnette Gourd [place] of [place] Phone: [phone] Mobile Phone: [phone] Relation: Son  Code Status:  DNR  Goals of care: Advanced Directive information Advanced Directives 02/07/2018  Does Patient Have a Medical Advance Directive? Yes  Type of Paramedic of [place];Out of facility DNR (pink MOST or yellow form)  Does patient want to make changes to medical advance directive? -  Copy of [place] in Chart? Yes  Pre-existing out of facility DNR order (yellow form or pink MOST form) Yellow form placed in chart (order not valid for inpatient use);Pink MOST form placed in chart (order not valid for inpatient use)     Chief Complaint  Patient presents with  . Medical Management of Chronic Issues    monthly routine visit    HPI:  Pt is a 84 y.o. male seen today [place] for medical management of chronic diseases. He is seen in his room today with seater at bedside.He has had no recent fall episode.His weight continues to be stable. Patient's seater states patient eats well at least two meals.he continues to require total care assistance and follows up with hospice service.    Past  Medical History:  Diagnosis Date  . ANEMIA-IRON DEFICIENCY 05/27/2007  . ANXIETY 11/14/2007  . BENIGN PROSTATIC HYPERTROPHY 11/14/2007  . CAROTID ARTERY DISEASE 10/02/2010  . Chronic combined systolic and diastolic CHF (congestive heart failure) ([place]) 10/10/2014  . CKD stage 3 due to type 1 diabetes mellitus ([place]) 03/17/2015  . COLONIC POLYPS, HX OF 05/27/2007  . CORONARY ARTERY DISEASE 11/14/2007   prior bypass [phone]; Inferior MI 11/07; Severe 3 vessel disease; occluded SVG to RCA; SVG to OM subtotalled, SVG to diagonal with ostial 70 and 80 mid; EF 45. PCI of SVG to OM. FU myovue showed EF 56 with inferior infarct and very mild peri-infarct ischemia  . DEPRESSION 11/14/2007  . DIABETES MELLITUS, TYPE II 11/14/2007  . DIVERTICULITIS, HX OF 05/27/2007  . DM (diabetes mellitus), type 2 with renal complications ([place]) 3/89/3734  . DVT (deep venous thrombosis) ([place]) 03/21/2011  . Dyspnea 11/09/2014  . Effusion of left knee 03/17/2015  . Failure to thrive in adult 05/03/2015  . FOOT PAIN, BILATERAL 12/08/2010  . Generalized weakness 05/20/2015  . GERD 11/14/2007  . Hearing loss 05/13/2009   Qualifier: Diagnosis of  By: Jenny Reichmann MD, Hunt Oris   . Hyperlipemia 11/14/2007   Qualifier: Diagnosis of  By: Jenny Reichmann MD, Hunt Oris    . HYPERLIPIDEMIA 11/14/2007  . HYPERTENSION 11/14/2007  . HYPOTENSION, ORTHOSTATIC 03/06/2011  . Intermittent high-grade heart block 09/05/2009   s/p pacemaker  . Ischemic cardiomyopathy    a. echo (10/15):  mod focal basal septal hypertrophy, EF 40-45%, inf AK, Gr 1 DD, Al sclerosis, mild AI, MAC, mild MR, mod LAE, lipomatous hypertrophy, small  effusion (no hemodynamic compromise)   . Memory loss 06/01/2008  . Myalgia and myositis, unspecified 04/24/2011  . Osteoarthrosis, unspecified whether generalized or localized, unspecified site   . OSTEOPOROSIS 06/02/2010  . Other malaise and fatigue 04/24/2011  . Pacemaker-St. Jude 02/02/2008   St. Jude  . Peripheral neuropathy 03/27/2011  .  PERIPHERAL VASCULAR DISEASE 11/14/2007  . Prostate cancer ([place])   . Protein-calorie malnutrition, severe ([place]) 05/23/2015  . RENAL INSUFFICIENCY 11/14/2007  . Seborrheic keratosis 06/05/2011  . SECONDARY DM W/PERIPHERAL CIRC D/O UNCONTROLLED 11/07/2010  . Severe back pain   . SYNCOPE 09/08/2008  . Unspecified hearing loss 05/13/2009  . Vertebral compression fracture ([place]) 09/05/2009   Qualifier: Diagnosis of  By: Jenny Reichmann MD, Hunt Oris   . Zachary Harris 04/04/2015  . Weight loss 10/10/2014   Past Surgical History:  Procedure Laterality Date  . APPENDECTOMY    . CHOLECYSTECTOMY    . CORONARY ARTERY BYPASS GRAFT  2007  . CYSTOSCOPY    . PACEMAKER INSERTION  2012   Caryl Comes, MD  . prostate needle biopsy    . s/p bilat knee replacement  1992   Paul, MD  . s/p coronary stent x 1      Allergies  Allergen Reactions  . Hydrocodone     hallucination  . Tramadol     hallucination    Allergies as of 02/07/2018      Reactions   Hydrocodone    hallucination   Tramadol    hallucination      Medication List        Accurate as of 02/07/18  4:44 PM. Always use your most recent med list.          acetaminophen 325 MG tablet Commonly known as:  TYLENOL Take 650 mg by mouth every 4 (four) hours as needed.   ALPRAZolam 0.25 MG tablet Commonly known as:  XANAX Take 0.25 mg by mouth 2 (two) times daily as needed for anxiety.   bisacodyl 10 MG suppository Commonly known as:  DULCOLAX Place 10 mg every three (3) days as needed rectally for moderate constipation.   BOOST DIABETIC Liqd Take 237 mLs by mouth 2 (two) times daily. May also have one at [facility] as needed   carbamide peroxide 6.5 % OTIC solution Commonly known as:  DEBROX 5 drops. Instil 5 - 10 drops in each ear at bedtime 3 times a week every other month   furosemide 20 MG tablet Commonly known as:  LASIX Take 20 mg daily by mouth.   levothyroxine 25 MCG tablet Commonly known as:  SYNTHROID, LEVOTHROID Take 25 mcg by mouth daily.     morphine 20 MG/5ML solution Take 5 mg by mouth every 8 (eight) hours as needed (severe pain or dyspnea).   nitroGLYCERIN 0.4 MG SL tablet Commonly known as:  NITROSTAT Place 0.4 mg under the tongue every 5 (five) minutes as needed for chest pain.   OXYGEN Inhale 1 L/min into the lungs as needed.   risperiDONE 0.25 MG tablet Commonly known as:  RISPERDAL Take 0.25 mg by mouth every other day.   SYSTANE BALANCE 0.6 % Soln Generic drug:  Propylene Glycol Apply to eye. One drop both eyes 2 times a day due to dry eyes   UNABLE TO FIND Place 1 patch onto the skin every 3 (three) days. Med Name: Fentanyl Patch 12 mcg.       Review of Systems  Unable to perform ROS: Dementia (additional information provided by patient's seater and  nurse )    Immunization History  Administered Date(s) Administered  . Influenza Whole 09/23/2010, 09/23/2012  . Influenza,inj,Quad PF,6+ Mos 10/09/2014  . Influenza-Unspecified 09/10/2013, 09/30/2015, 10/05/2016, 10/03/2017  . PPD Test 05/23/2015  . Pneumococcal Conjugate-13 04/27/2017  . Pneumococcal Polysaccharide-23 03/31/2009  . Td 03/31/2009   Pertinent  Health Maintenance Due  Topic Date Due  . FOOT EXAM  12/28/2017  . HEMOGLOBIN A1C  04/27/2018  . OPHTHALMOLOGY EXAM  06/12/2018  . URINE MICROALBUMIN  07/05/2018  . INFLUENZA VACCINE  Completed  . PNA vac Low Risk Adult  Completed   Fall Risk  07/26/2017 09/26/2015 05/26/2015 04/29/2015 04/04/2015  Falls in the past year? _0   Number falls in past yr: _1 Injury with Fall? No No Yes Yes Yes  Risk Factor Category  - - High Fall Risk - High Fall Risk  Risk for fall due to : - Impaired balance/gait History of fall(s);Impaired balance/gait;Impaired mobility - History of fall(s);Impaired balance/gait;Impaired mobility  Follow up - Falls evaluation completed Falls evaluation completed - Falls evaluation completed    Vitals:   02/07/18 1059  BP: (!) 118/56  Pulse: 60  Resp:  18  Temp: 98.1 F (36.7 C)  SpO2: 91%  Weight: 132 lb (59.9 kg)  Height: _2  (1.778 m)   Body mass index is 18.94 kg/m. Physical Exam  Constitutional:  Thin frail elderly in no acute distress   HENT:  Head: Normocephalic.  Right Ear: External ear normal.  Left Ear: External ear normal.  Mouth/Throat: Oropharynx is clear and moist. No oropharyngeal exudate.  Eyes: Conjunctivae and EOM are normal. Pupils are equal, round, and reactive to light. Right eye exhibits no discharge. Left eye exhibits no discharge. No scleral icterus.  Neck: Normal range of motion. No JVD present. No thyromegaly present.  Cardiovascular: Exam reveals no gallop and no friction rub.  Murmur heard. Left upper chest pace maker   Pulmonary/Chest: Effort normal and breath sounds normal. No respiratory distress. He has no wheezes. He has no rales.  Abdominal: Soft. Bowel sounds are normal. He exhibits no distension. There is no tenderness. There is no rebound and no guarding.  Genitourinary:  Genitourinary Comments: Incontinent for both bowel and bladder   Musculoskeletal: He exhibits no edema or tenderness.  Lymphadenopathy:    He has no cervical adenopathy.  Neurological: He is alert.  Pleasantly confused at baseline  Skin: Skin is warm and dry. No rash noted. No erythema. No pallor.  Psychiatric: He has a normal mood and affect. His behavior is normal.   Labs reviewed: Recent Labs    04/11/17 07/29/17 08/05/17  NA 134* 144 138  K 4.4 3.6 3.8  BUN 57* 33* 28*  CREATININE 1.3 1.2 0.8   Recent Labs    03/29/17 07/29/17 08/05/17  AST _3 ALT 8* 7* 8*  ALKPHOS 112 140* 141*   Recent Labs    03/29/17 07/29/17 08/05/17  WBC 6.4 6.0 5.4  HGB 13.8 12.5* 11.9*  HCT 41 38* 36*  PLT 132* 162 175   Lab Results  Component Value Date   TSH 3.12 10/03/2017   Lab Results  Component Value Date   HGBA1C 5.7 10/28/2017   Lab Results  Component Value Date   CHOL 132 02/07/2015   HDL 33 (A)  02/07/2015   LDLCALC 76 02/07/2015   LDLDIRECT 85.6 07/19/2010   TRIG 115 02/07/2015   CHOLHDL 4 03/06/2011  Significant Diagnostic Results in last 30 days:  No results found.  Assessment/Plan 1. Hypothyroidism Continue on levothyroxine 25 mcg tablet daily.monitor TSH level.   2. Chronic combined systolic and diastolic CHF (congestive heart failure)  Stable.No recent abrupt weight gain.Exam negative for edema,cough,shortness of breath ,wheezing or rales.continue on Furosemide 20 mg tablet daily.continue to monitor weight.continue on Hospice service.   3. Alzheimer's dementia with behavioral disturbance, unspecified timing of dementia onset No new behavioral issues.continue on Risperidone 0.25 mg tablet every other day and alprazolam 0.25 mg tablet twice daily as needed. Continue to assist with ADL'S.fall and safety precautions.   Family/ staff Communication: Reviewed plan of care with patient and facility Nurse supervisor   Labs/tests ordered: None   Sandrea Hughs, NP

## 2018-02-07 NOTE — Patient Instructions (Addendum)
.  mpavs

## 2018-02-11 ENCOUNTER — Other Ambulatory Visit: Payer: Self-pay | Admitting: Internal Medicine

## 2018-03-03 ENCOUNTER — Ambulatory Visit (INDEPENDENT_AMBULATORY_CARE_PROVIDER_SITE_OTHER): Admitting: *Deleted

## 2018-03-03 DIAGNOSIS — I442 Atrioventricular block, complete: Secondary | ICD-10-CM | POA: Diagnosis not present

## 2018-03-03 NOTE — Progress Notes (Signed)
Remote pacemaker transmission.   

## 2018-03-05 ENCOUNTER — Encounter: Payer: Self-pay | Admitting: Cardiology

## 2018-03-05 ENCOUNTER — Non-Acute Institutional Stay (SKILLED_NURSING_FACILITY): Payer: Medicare Other | Admitting: Internal Medicine

## 2018-03-05 ENCOUNTER — Encounter: Payer: Self-pay | Admitting: Internal Medicine

## 2018-03-05 DIAGNOSIS — E1121 Type 2 diabetes mellitus with diabetic nephropathy: Secondary | ICD-10-CM | POA: Diagnosis not present

## 2018-03-05 DIAGNOSIS — E43 Unspecified severe protein-calorie malnutrition: Secondary | ICD-10-CM

## 2018-03-05 DIAGNOSIS — G309 Alzheimer's disease, unspecified: Secondary | ICD-10-CM

## 2018-03-05 DIAGNOSIS — M549 Dorsalgia, unspecified: Secondary | ICD-10-CM | POA: Diagnosis not present

## 2018-03-05 DIAGNOSIS — J9611 Chronic respiratory failure with hypoxia: Secondary | ICD-10-CM

## 2018-03-05 DIAGNOSIS — G8929 Other chronic pain: Secondary | ICD-10-CM

## 2018-03-05 DIAGNOSIS — F0281 Dementia in other diseases classified elsewhere with behavioral disturbance: Secondary | ICD-10-CM | POA: Diagnosis not present

## 2018-03-05 DIAGNOSIS — E039 Hypothyroidism, unspecified: Secondary | ICD-10-CM

## 2018-03-05 NOTE — Progress Notes (Signed)
Location:  [facility] Room Number: 9 Place of Service:  SNF [phone]) Provider:  Blanchie Serve MD  Blanchie Serve, MD  Patient Care Team: Blanchie Serve, MD as PCP - General (Internal Medicine) Irine Seal, MD as Consulting Physician (Urology) Verl Blalock, Marijo Conception, MD (Inactive) as Consulting Physician (Cardiology) [facility] C[facility]shaw, Denice Bors, MD as Consulting Physician (Cardiology) Rigoberto Noel, MD as Consulting Physician (Pulmonary Disease) Ngetich, Nelda Bucks, NP as Nurse Practitioner (Family Medicine)  Extended Emergency Contact Information Primary Emergency Contact: Kathline Magic, Rosezella Rumpf of [place] Phone: [phone] Relation: Daughter Secondary Emergency Contact: Johnnette Gourd [place] of [place] Phone: [phone] Mobile Phone: [phone] Relation: Son  Code Status:  DNR  Goals of care: Advanced Directive information Advanced Directives 03/05/2018  Does Patient Have a Medical Advance Directive? Yes  Type of Paramedic of [place];Living will;Out of facility DNR (pink MOST or yellow form)  Does patient want to make changes to medical advance directive? No - Patient declined  Copy of [place] in Chart? Yes  Pre-existing out of facility DNR order (yellow form or pink MOST form) Yellow form placed in chart (order not valid for inpatient use);Pink MOST form placed in chart (order not valid for inpatient use)     Chief Complaint  Patient presents with  . Medical Management of Chronic Issues    Routine Visit     HPI:  Pt is a 84 y.o. male seen today for medical management of chronic diseases. He has dementia and is pleasantly confused. He was having some headache this am and tylenol has helped. He denies any concern today. Poor po intake per nursing. No fall reported. He is mostly in bed. He is getting skin care for abrasion to his back. Reviewed blood pressure  reading with some low reading 89/67, 98/58, 97/63. He has been under hospice services and has a sitter at bedside.    Past Medical History:  Diagnosis Date  . ANEMIA-IRON DEFICIENCY 05/27/2007  . ANXIETY 11/14/2007  . BENIGN PROSTATIC HYPERTROPHY 11/14/2007  . CAROTID ARTERY DISEASE 10/02/2010  . Chronic combined systolic and diastolic CHF (congestive heart failure) ([place]) 10/10/2014  . CKD stage 3 due to type 1 diabetes mellitus ([place]) 03/17/2015  . COLONIC POLYPS, HX OF 05/27/2007  . CORONARY ARTERY DISEASE 11/14/2007   prior bypass [phone]; Inferior MI 11/07; Severe 3 vessel disease; occluded SVG to RCA; SVG to OM subtotalled, SVG to diagonal with ostial 70 and 80 mid; EF 45. PCI of SVG to OM. FU myovue showed EF 56 with inferior infarct and very mild peri-infarct ischemia  . DEPRESSION 11/14/2007  . DIABETES MELLITUS, TYPE II 11/14/2007  . DIVERTICULITIS, HX OF 05/27/2007  . DM (diabetes mellitus), type 2 with renal complications ([place]) 5/64/3329  . DVT (deep venous thrombosis) ([place]) 03/21/2011  . Dyspnea 11/09/2014  . Effusion of left knee 03/17/2015  . Failure to thrive in adult 05/03/2015  . FOOT PAIN, BILATERAL 12/08/2010  . Generalized weakness 05/20/2015  . GERD 11/14/2007  . Hearing loss 05/13/2009   Qualifier: Diagnosis of  By: Jenny Reichmann MD, Hunt Oris   . Hyperlipemia 11/14/2007   Qualifier: Diagnosis of  By: Jenny Reichmann MD, Hunt Oris    . HYPERLIPIDEMIA 11/14/2007  . HYPERTENSION 11/14/2007  . HYPOTENSION, ORTHOSTATIC 03/06/2011  . Intermittent high-grade heart block 09/05/2009   s/p pacemaker  . Ischemic cardiomyopathy    a. echo (  10/15):  mod focal basal septal hypertrophy, EF 40-45%, inf AK, Gr 1 DD, Al sclerosis, mild AI, MAC, mild MR, mod LAE, lipomatous hypertrophy, small effusion (no hemodynamic compromise)   . Memory loss 06/01/2008  . Myalgia and myositis, unspecified 04/24/2011  . Osteoarthrosis, unspecified whether generalized or localized, unspecified site   . OSTEOPOROSIS 06/02/2010    . Other malaise and fatigue 04/24/2011  . Pacemaker-St. Jude 02/02/2008   St. Jude  . Peripheral neuropathy 03/27/2011  . PERIPHERAL VASCULAR DISEASE 11/14/2007  . Prostate cancer ([place])   . Protein-calorie malnutrition, severe ([place]) 05/23/2015  . RENAL INSUFFICIENCY 11/14/2007  . Seborrheic keratosis 06/05/2011  . SECONDARY DM W/PERIPHERAL CIRC D/O UNCONTROLLED 11/07/2010  . Severe back pain   . SYNCOPE 09/08/2008  . Unspecified hearing loss 05/13/2009  . Vertebral compression fracture ([place]) 09/05/2009   Qualifier: Diagnosis of  By: Jenny Reichmann MD, Hunt Oris   . [name] 04/04/2015  . Weight loss 10/10/2014   Past Surgical History:  Procedure Laterality Date  . APPENDECTOMY    . CHOLECYSTECTOMY    . CORONARY ARTERY BYPASS GRAFT  2007  . CYSTOSCOPY    . PACEMAKER INSERTION  2012   Caryl Comes, MD  . prostate needle biopsy    . s/p bilat knee replacement  1992   Paul, MD  . s/p coronary stent x 1      Allergies  Allergen Reactions  . Hydrocodone     hallucination  . Tramadol     hallucination    Outpatient Encounter Medications as of 03/05/2018  Medication Sig  . acetaminophen (TYLENOL) 325 MG tablet Take 650 mg by mouth every 4 (four) hours as needed.  . ALPRAZolam (XANAX) 0.25 MG tablet Take 0.25 mg by mouth 2 (two) times daily as needed for anxiety.  . bisacodyl (DULCOLAX) 10 MG suppository Place 10 mg every three (3) days as needed rectally for moderate constipation.  . carbamide peroxide (DEBROX) 6.5 % otic solution 5 drops. Instil 5 - 10 drops in each ear at bedtime 3 times a week every other month  . furosemide (LASIX) 20 MG tablet Take 20 mg daily by mouth.   . levothyroxine (SYNTHROID, LEVOTHROID) 25 MCG tablet Take 25 mcg by mouth daily.  Marland Kitchen morphine 20 MG/5ML solution Take 5 mg by mouth every 8 (eight) hours as needed (severe pain or dyspnea).  . nitroGLYCERIN (NITROSTAT) 0.4 MG SL tablet Place 0.4 mg under the tongue every 5 (five) minutes as needed for chest pain.  . Nutritional  Supplements (BOOST DIABETIC) LIQD Take 237 mLs by mouth 2 (two) times daily. May also have one at [facility] as needed  . OXYGEN Inhale 1 L/min into the lungs as needed.   Marland Kitchen Propylene Glycol (SYSTANE BALANCE) 0.6 % SOLN Apply to eye. One drop both eyes 2 times a day due to dry eyes  . risperiDONE (RISPERDAL) 0.25 MG tablet Take 0.25 mg by mouth every other day.  Marland Kitchen UNABLE TO FIND Place 1 patch onto the skin every 3 (three) days. Med Name: Fentanyl Patch 12 mcg.   No facility-administered encounter medications on file as of 03/05/2018.     Review of Systems  Constitutional: Positive for fatigue. Negative for appetite change, chills and fever.  HENT: Negative for congestion.   Respiratory: Negative for cough and shortness of breath.        On oxygen by nasal canula  Cardiovascular: Negative for chest pain and palpitations.  Gastrointestinal: Negative for abdominal pain, constipation, diarrhea, nausea and vomiting.  Genitourinary: Negative for dysuria.       Has urinary incontinence  Musculoskeletal: Positive for gait problem. Negative for back pain.  Skin: Negative for rash.  Neurological: Negative for dizziness and headaches.  Psychiatric/Behavioral: Positive for confusion. Negative for behavioral problems.    Immunization History  Administered Date(s) Administered  . Influenza Whole 09/23/2010, 09/23/2012  . Influenza,inj,Quad PF,6+ Mos 10/09/2014  . Influenza-Unspecified 09/10/2013, 09/30/2015, 10/05/2016, 10/03/2017  . PPD Test 05/23/2015  . Pneumococcal Conjugate-13 04/27/2017  . Pneumococcal Polysaccharide-23 03/31/2009  . Td 03/31/2009   Pertinent  Health Maintenance Due  Topic Date Due  . FOOT EXAM  12/28/2017  . HEMOGLOBIN A1C  04/27/2018  . OPHTHALMOLOGY EXAM  06/12/2018  . URINE MICROALBUMIN  07/05/2018  . INFLUENZA VACCINE  Completed  . PNA vac Low Risk Adult  Completed   Fall Risk  07/26/2017 09/26/2015 05/26/2015 04/29/2015 04/04/2015  Falls in the past year? Yes Yes Yes Yes  Yes  Number falls in past yr: _0 Injury with Fall? No No Yes Yes Yes  Risk Factor Category  - - High Fall Risk - High Fall Risk  Risk for fall due to : - Impaired balance/gait History of fall(s);Impaired balance/gait;Impaired mobility - History of fall(s);Impaired balance/gait;Impaired mobility  Follow up - Falls evaluation completed Falls evaluation completed - Falls evaluation completed   Functional Status Survey:    Vitals:   03/05/18 1449  BP: 113/64  Pulse: 66  Resp: 20  Temp: (!) 97.3 F (36.3 C)  TempSrc: Oral  SpO2: 99%  Weight: 128 lb (58.1 kg)  Height: _1  (1.778 m)   Body mass index is 18.37 kg/m.   Wt Readings from Last 3 Encounters:  03/05/18 128 lb (58.1 kg)  02/07/18 132 lb (59.9 kg)  01/20/18 133 lb 3.2 oz (60.4 kg)   Physical Exam  Constitutional:  Thin built, frail, in no acute distress  HENT:  Head: Normocephalic and atraumatic.  Nose: Nose normal.  Mouth/Throat: Oropharynx is clear and moist. No oropharyngeal exudate.  Eyes: Conjunctivae and EOM are normal. Pupils are equal, round, and reactive to light. Right eye exhibits no discharge. Left eye exhibits no discharge.  Neck: Normal range of motion. Neck supple.  Cardiovascular: Normal rate and regular rhythm.  Left pacemaker  Pulmonary/Chest: Effort normal and breath sounds normal. He has no wheezes. He has no rales.  Abdominal: Soft. Bowel sounds are normal. There is no tenderness. There is no guarding.  Musculoskeletal: He exhibits no edema.  Able to move all 4 extremities, unsteady gait, needs assistance with transfers  Lymphadenopathy:    He has no cervical adenopathy.  Neurological: He is alert.  Oriented to person only  Skin: Skin is warm and dry. No rash noted. He is not diaphoretic.  Healed abrasion to lower back area with alevyn dressing on it  Psychiatric: He has a normal mood and affect.    Labs reviewed: Recent Labs    04/11/17 07/29/17 08/05/17  NA 134* 144 138  K  4.4 3.6 3.8  BUN 57* 33* 28*  CREATININE 1.3 1.2 0.8   Recent Labs    03/29/17 07/29/17 08/05/17  AST _2 ALT 8* 7* 8*  ALKPHOS 112 140* 141*   Recent Labs    03/29/17 07/29/17 08/05/17  WBC 6.4 6.0 5.4  HGB 13.8 12.5* 11.9*  HCT 41 38* 36*  PLT 132* 162 175   Lab Results  Component Value Date   TSH 3.12 10/03/2017  Lab Results  Component Value Date   HGBA1C 5.7 10/28/2017   Lab Results  Component Value Date   CHOL 132 02/07/2015   HDL 33 (A) 02/07/2015   LDLCALC 76 02/07/2015   LDLDIRECT 85.6 07/19/2010   TRIG 115 02/07/2015   CHOLHDL 4 03/06/2011    Significant Diagnostic Results in last 30 days:  No results found.  Assessment/Plan  Severe protein calorie malnutrition Continues to lose weight, decline anticipated. Continue feeding supplemnt as tolerated.  Hypothyroidism Continue levothyroxine  Dementia with behavioral disturbance C/w risperidone 0.25 mg qod and supportive care, decline anticipated. Followed by hospice  Chronic respiratory failure Continue o2 by nasal canula with prn morphine  Chronic back pain With history of fractures, continue fentanyl patch and prn morphine, under hospice service.   Type 2 diabetes with renal impairment Supportive care, off hypoglycemic agent   Family/ staff Communication: reviewed care plan with patient and charge nurse.    Labs/tests ordered:  none   Blanchie Serve, MD Internal Medicine [facility] [address] [place], [place] [address] Cell Phone (Monday-Friday 8 am - 5 pm): [phone] On Call: [phone] and follow prompts after 5 pm and on weekends Office Phone: [phone] Office Fax: [phone]

## 2018-03-22 LAB — CUP PACEART REMOTE DEVICE CHECK
Brady Statistic AS VP Percent: 50 %
Brady Statistic RA Percent Paced: 6.1 %
Brady Statistic RV Percent Paced: 55 %
Date Time Interrogation Session: 20190311070009
Implantable Lead Location: 753860
Lead Channel Impedance Value: 480 Ohm
Lead Channel Pacing Threshold Pulse Width: 0.4 ms
Lead Channel Sensing Intrinsic Amplitude: 12 mV
Lead Channel Setting Pacing Amplitude: 2 V
Lead Channel Setting Sensing Sensitivity: 2 mV
MDC IDC LEAD IMPLANT DT: 20120326
MDC IDC LEAD IMPLANT DT: 20120326
MDC IDC LEAD LOCATION: 753859
MDC IDC MSMT BATTERY REMAINING LONGEVITY: 85 mo
MDC IDC MSMT BATTERY REMAINING PERCENTAGE: 65 %
MDC IDC MSMT BATTERY VOLTAGE: 2.9 V
MDC IDC MSMT LEADCHNL RA IMPEDANCE VALUE: 380 Ohm
MDC IDC MSMT LEADCHNL RA PACING THRESHOLD AMPLITUDE: 0.5 V
MDC IDC MSMT LEADCHNL RA SENSING INTR AMPL: 2.8 mV
MDC IDC MSMT LEADCHNL RV PACING THRESHOLD AMPLITUDE: 0.75 V
MDC IDC MSMT LEADCHNL RV PACING THRESHOLD PULSEWIDTH: 0.4 ms
MDC IDC PG IMPLANT DT: 20120326
MDC IDC SET LEADCHNL RV PACING AMPLITUDE: 1 V
MDC IDC SET LEADCHNL RV PACING PULSEWIDTH: 0.4 ms
MDC IDC STAT BRADY AP VP PERCENT: 4.7 %
MDC IDC STAT BRADY AP VS PERCENT: 5.5 %
MDC IDC STAT BRADY AS VS PERCENT: 35 %
Pulse Gen Model: 2210
Pulse Gen Serial Number: 7228315

## 2018-03-24 ENCOUNTER — Encounter: Payer: Self-pay | Admitting: Family

## 2018-03-24 ENCOUNTER — Non-Acute Institutional Stay (SKILLED_NURSING_FACILITY): Payer: Medicare Other | Admitting: Family

## 2018-03-24 DIAGNOSIS — W19XXXA Unspecified fall, initial encounter: Secondary | ICD-10-CM | POA: Diagnosis not present

## 2018-03-24 DIAGNOSIS — R2681 Unsteadiness on feet: Secondary | ICD-10-CM | POA: Diagnosis not present

## 2018-03-24 DIAGNOSIS — T148XXA Other injury of unspecified body region, initial encounter: Secondary | ICD-10-CM | POA: Diagnosis not present

## 2018-03-24 DIAGNOSIS — Y92129 Unspecified place in nursing home as the place of occurrence of the external cause: Secondary | ICD-10-CM | POA: Diagnosis not present

## 2018-03-24 NOTE — Progress Notes (Signed)
Location:  [facility] Room Number: 9 Place of Service:  SNF ([phone]) Provider: Dinah Ngetich FNP-C  Blanchie Serve, MD  Patient Care Team: Blanchie Serve, MD as PCP - General (Internal Medicine) Irine Seal, MD as Consulting Physician (Urology) Verl Blalock, Marijo Conception, MD (Inactive) as Consulting Physician (Cardiology) [place] C[facility], Denice Bors, MD as Consulting Physician (Cardiology) Rigoberto Noel, MD as Consulting Physician (Pulmonary Disease) Ngetich, Nelda Bucks, NP as Nurse Practitioner (Family Medicine)  Extended Emergency Contact Information Primary Emergency Contact: Kathline Magic, [place] of [place] Phone: [phone] Relation: Daughter Secondary Emergency Contact: Johnnette Gourd [place] of [place] Phone: [phone] Mobile Phone: [phone] Relation: Son  Code Status:  DNR Goals of care: Advanced Directive information Advanced Directives 03/24/2018  Does Patient Have a Medical Advance Directive? Yes  Type of Paramedic of [place];Out of facility DNR (pink MOST or yellow form)  Does patient want to make changes to medical advance directive? -  Copy of [place] in Chart? Yes  Pre-existing out of facility DNR order (yellow form or pink MOST form) Yellow form placed in chart (order not valid for inpatient use);Pink MOST form placed in chart (order not valid for inpatient use)     Chief Complaint  Patient presents with  . Acute Visit    fall with bruising    HPI:  Pt is a 84 y.o. male seen today at [facility] for an acute visit for evaluation of fall episode.He was observed on the floor bedside his bed slightly under the bed per facility nurse.Bruise reported to left face,left shoulder,elbow and left upper lateral leg.he denies any pain,fever,chills,cough or signs of urinary tract infections.He continues to follow up with Hospice service.    Past  Medical History:  Diagnosis Date  . ANEMIA-IRON DEFICIENCY 05/27/2007  . ANXIETY 11/14/2007  . BENIGN PROSTATIC HYPERTROPHY 11/14/2007  . CAROTID ARTERY DISEASE 10/02/2010  . Chronic combined systolic and diastolic CHF (congestive heart failure) ([place]) 10/10/2014  . CKD stage 3 due to type 1 diabetes mellitus ([place]) 03/17/2015  . COLONIC POLYPS, HX OF 05/27/2007  . CORONARY ARTERY DISEASE 11/14/2007   prior bypass [phone]; Inferior MI 11/07; Severe 3 vessel disease; occluded SVG to RCA; SVG to OM subtotalled, SVG to diagonal with ostial 70 and 80 mid; EF 45. PCI of SVG to OM. FU myovue showed EF 56 with inferior infarct and very mild peri-infarct ischemia  . DEPRESSION 11/14/2007  . DIABETES MELLITUS, TYPE II 11/14/2007  . DIVERTICULITIS, HX OF 05/27/2007  . DM (diabetes mellitus), type 2 with renal complications ([place]) 07/18/3663  . DVT (deep venous thrombosis) ([place]) 03/21/2011  . Dyspnea 11/09/2014  . Effusion of left knee 03/17/2015  . Failure to thrive in adult 05/03/2015  . FOOT PAIN, BILATERAL 12/08/2010  . Generalized weakness 05/20/2015  . GERD 11/14/2007  . Hearing loss 05/13/2009   Qualifier: Diagnosis of  By: Jenny Reichmann MD, Hunt Oris   . Hyperlipemia 11/14/2007   Qualifier: Diagnosis of  By: Jenny Reichmann MD, Hunt Oris    . HYPERLIPIDEMIA 11/14/2007  . HYPERTENSION 11/14/2007  . HYPOTENSION, ORTHOSTATIC 03/06/2011  . Intermittent high-grade heart block 09/05/2009   s/p pacemaker  . Ischemic cardiomyopathy    a. echo (10/15):  mod focal basal septal hypertrophy, EF 40-45%, inf AK, Gr 1 DD, Al sclerosis, mild AI, MAC, mild MR, mod LAE, lipomatous hypertrophy, small effusion (no hemodynamic compromise)   .  Memory loss 06/01/2008  . Myalgia and myositis, unspecified 04/24/2011  . Osteoarthrosis, unspecified whether generalized or localized, unspecified site   . OSTEOPOROSIS 06/02/2010  . Other malaise and fatigue 04/24/2011  . Pacemaker-St. Jude 02/02/2008   St. Jude  . Peripheral neuropathy 03/27/2011  .  PERIPHERAL VASCULAR DISEASE 11/14/2007  . Prostate cancer ([place])   . Protein-calorie malnutrition, severe ([place]) 05/23/2015  . RENAL INSUFFICIENCY 11/14/2007  . Seborrheic keratosis 06/05/2011  . SECONDARY DM W/PERIPHERAL CIRC D/O UNCONTROLLED 11/07/2010  . Severe back pain   . SYNCOPE 09/08/2008  . Unspecified hearing loss 05/13/2009  . Vertebral compression fracture ([place]) 09/05/2009   Qualifier: Diagnosis of  By: Jenny Reichmann MD, Hunt Oris   . Zachary Harris 04/04/2015  . Weight loss 10/10/2014   Past Surgical History:  Procedure Laterality Date  . APPENDECTOMY    . CHOLECYSTECTOMY    . CORONARY ARTERY BYPASS GRAFT  2007  . CYSTOSCOPY    . PACEMAKER INSERTION  2012   Caryl Comes, MD  . prostate needle biopsy    . s/p bilat knee replacement  1992   Paul, MD  . s/p coronary stent x 1      Allergies  Allergen Reactions  . Hydrocodone     hallucination  . Tramadol     hallucination    Outpatient Encounter Medications as of 03/24/2018  Medication Sig  . acetaminophen (TYLENOL) 325 MG tablet Take 650 mg by mouth every 4 (four) hours as needed.  . ALPRAZolam (XANAX) 0.25 MG tablet Take 0.25 mg by mouth 2 (two) times daily as needed for anxiety.  . bisacodyl (DULCOLAX) 10 MG suppository Place 10 mg every three (3) days as needed rectally for moderate constipation.  . carbamide peroxide (DEBROX) 6.5 % otic solution 5 drops. Instil 5 - 10 drops in each ear at bedtime 3 times a week every other month  . furosemide (LASIX) 20 MG tablet Take 20 mg daily by mouth.   . levothyroxine (SYNTHROID, LEVOTHROID) 25 MCG tablet Take 25 mcg by mouth daily.  Marland Kitchen morphine 20 MG/5ML solution Take 5 mg by mouth every 8 (eight) hours as needed (severe pain or dyspnea).  . nitroGLYCERIN (NITROSTAT) 0.4 MG SL tablet Place 0.4 mg under the tongue every 5 (five) minutes as needed for chest pain.  . Nutritional Supplements (BOOST DIABETIC) LIQD Take 237 mLs by mouth 2 (two) times daily. May also have one at [facility] as needed  . OXYGEN Inhale  1 L/min into the lungs as needed.   Marland Kitchen Propylene Glycol (SYSTANE BALANCE) 0.6 % SOLN Apply to eye. One drop both eyes 2 times a day due to dry eyes  . risperiDONE (RISPERDAL) 0.25 MG tablet Take 0.25 mg by mouth every other day.  Marland Kitchen UNABLE TO FIND Place 1 patch onto the skin every 3 (three) days. Med Name: Fentanyl Patch 12 mcg.   No facility-administered encounter medications on file as of 03/24/2018.     Review of Systems  Unable to perform ROS: Dementia (additional infomation provided by patient's sitter and facility Nurse. )    Immunization History  Administered Date(s) Administered  . Influenza Whole 09/23/2010, 09/23/2012  . Influenza,inj,Quad PF,6+ Mos 10/09/2014  . Influenza-Unspecified 09/10/2013, 09/30/2015, 10/05/2016, 10/03/2017  . PPD Test 05/23/2015  . Pneumococcal Conjugate-13 04/27/2017  . Pneumococcal Polysaccharide-23 03/31/2009  . Td 03/31/2009   Pertinent  Health Maintenance Due  Topic Date Due  . FOOT EXAM  12/28/2017  . HEMOGLOBIN A1C  04/27/2018  . OPHTHALMOLOGY EXAM  06/12/2018  .  URINE MICROALBUMIN  07/05/2018  . INFLUENZA VACCINE  07/24/2018  . PNA vac Low Risk Adult  Completed   Fall Risk  07/26/2017 09/26/2015 05/26/2015 04/29/2015 04/04/2015  Falls in the past year? Yes Yes Yes Yes Yes  Number falls in past yr: 1 1 1 1 1   Injury with Fall? No No Yes Yes Yes  Risk Factor Category  - - High Fall Risk - High Fall Risk  Risk for fall due to : - Impaired balance/gait History of fall(s);Impaired balance/gait;Impaired mobility - History of fall(s);Impaired balance/gait;Impaired mobility  Follow up - Falls evaluation completed Falls evaluation completed - Falls evaluation completed    Vitals:   03/24/18 1053  BP: 114/65  Pulse: 65  Resp: 18  Temp: (!) 96.8 F (36 C)  SpO2: 90%  Weight: 128 lb (58.1 kg)  Height: 5' 10"  (1.778 m)   Body mass index is 18.37 kg/m. Physical Exam  Constitutional: He appears well-developed.  Elderly in no acute distress    HENT:  Head: Normocephalic.  Mouth/Throat: Oropharynx is clear and moist. No oropharyngeal exudate.  Eyes: Pupils are equal, round, and reactive to light. Conjunctivae and EOM are normal. Right eye exhibits no discharge. Left eye exhibits no discharge. No scleral icterus.  Neck: Normal range of motion. No JVD present. No thyromegaly present.  Cardiovascular: Intact distal pulses. Exam reveals no gallop and no friction rub.  Murmur heard. Paced  Pulmonary/Chest: Effort normal and breath sounds normal. No respiratory distress. He has no wheezes. He has no rales.  Abdominal: Soft. Bowel sounds are normal. He exhibits no distension. There is no tenderness. There is no rebound and no guarding.  Musculoskeletal: He exhibits no edema or tenderness.  Moves x 4 extremities without any difficulties.  Lymphadenopathy:    He has no cervical adenopathy.  Neurological: He is alert. Gait abnormal.  Pleasantly confused at his baseline   Skin: Skin is warm and dry. No rash noted. No erythema. No pallor.  Small bruises to left face,shoulder,elbow, left lateral upper leg and right upper back without any signs of infections  Psychiatric: He has a normal mood and affect. His speech is normal and behavior is normal. Judgment and thought content normal. Cognition and memory are impaired.    Labs reviewed: Recent Labs    04/11/17 07/29/17 08/05/17  NA 134* 144 138  K 4.4 3.6 3.8  BUN 57* 33* 28*  CREATININE 1.3 1.2 0.8   Recent Labs    03/29/17 07/29/17 08/05/17  AST 16 15 16   ALT 8* 7* 8*  ALKPHOS 112 140* 141*   Recent Labs    03/29/17 07/29/17 08/05/17  WBC 6.4 6.0 5.4  HGB 13.8 12.5* 11.9*  HCT 41 38* 36*  PLT 132* 162 175   Lab Results  Component Value Date   TSH 3.12 10/03/2017   Lab Results  Component Value Date   HGBA1C 5.7 10/28/2017   Lab Results  Component Value Date   CHOL 132 02/07/2015   HDL 33 (A) 02/07/2015   LDLCALC 76 02/07/2015   LDLDIRECT 85.6 07/19/2010   TRIG  115 02/07/2015   CHOLHDL 4 03/06/2011    Significant Diagnostic Results in last 30 days:  No results found.  Assessment/Plan 1. Unsteady gait He remains high risk for falls due to advance age and poor safety awareness.continue to encourage to call for assistance.Fall and safety precautions.   2. Bruised Multiple sites small bruises to left face,shoulder,elbow, left lateral upper leg and right upper back without  any signs of infections.Status post fall episode 03/22/2018.continue current treatment and monitor for signs of infection.  3. Fall at nursing home, initial encounter Observed on the floor by facility staff as above.patient afebrile.vital signs stable on review.No signs of infections.His poor safety awareness contributing to falls.continue to monitor.   Family/ staff Communication: Reviewed plan of care with patient and facility Nurse.   Labs/tests ordered: None   Dinah C Ngetich, NP

## 2018-04-01 ENCOUNTER — Encounter: Payer: Self-pay | Admitting: Family

## 2018-04-01 ENCOUNTER — Non-Acute Institutional Stay (SKILLED_NURSING_FACILITY): Payer: Medicare Other | Admitting: Family

## 2018-04-01 DIAGNOSIS — G309 Alzheimer's disease, unspecified: Secondary | ICD-10-CM

## 2018-04-01 DIAGNOSIS — E039 Hypothyroidism, unspecified: Secondary | ICD-10-CM | POA: Diagnosis not present

## 2018-04-01 DIAGNOSIS — F0281 Dementia in other diseases classified elsewhere with behavioral disturbance: Secondary | ICD-10-CM

## 2018-04-01 DIAGNOSIS — J9611 Chronic respiratory failure with hypoxia: Secondary | ICD-10-CM | POA: Diagnosis not present

## 2018-04-01 DIAGNOSIS — I5042 Chronic combined systolic (congestive) and diastolic (congestive) heart failure: Secondary | ICD-10-CM

## 2018-04-01 NOTE — Progress Notes (Signed)
Location:  [facility] Room Number: 9 Place of Service:  SNF [phone]) Provider: Rigley Niess FNP-C   Blanchie Serve, MD  Patient Care Team: Blanchie Serve, MD as PCP - General (Internal Medicine) Irine Seal, MD as Consulting Physician (Urology) Verl Blalock, Marijo Conception, MD (Inactive) as Consulting Physician (Cardiology) [place], Denice Bors, MD as Consulting Physician (Cardiology) Rigoberto Noel, MD as Consulting Physician (Pulmonary Disease) Emslee Lopezmartinez, Nelda Bucks, NP as Nurse Practitioner (Family Medicine)  Extended Emergency Contact Information Primary Emergency Contact: Kathline Magic, [place] Phone: [phone] Relation: Daughter Secondary Emergency Contact: Johnnette Gourd [place] of [place] Phone: [phone] Mobile Phone: [phone] Relation: Son  Code Status:  DNR  Goals of care: Advanced Directive information Advanced Directives 04/01/2018  Does Patient Have a Medical Advance Directive? Yes  Type of Paramedic of [place];Out of facility DNR (pink MOST or yellow form)  Does patient want to make changes to medical advance directive? -  Copy of [place] in Chart? Yes  Pre-existing out of facility DNR order (yellow form or pink MOST form) Yellow form placed in chart (order not valid for inpatient use);Pink MOST form placed in chart (order not valid for inpatient use)     Chief Complaint  Patient presents with  . Medical Management of Chronic Issues    monthly routine visit    HPI:  Pt is a 84 y.o. male seen today [facility] for medical management of chronic diseases.He has a medical history of HTN,CAD,Afib,Pacemaker, CHF,Hypothyroidism,PVD,type 2 DM,CKD stage 3,Dementia with behavioral disturbance among other conditions.He is seen in his room today with facility Nurse at bedside.He denies any acute issues during visit though HPI and ROS  limited due to dementia.He has had no recent fall since 03/22/2018. Nurse states patient tends to sleep close to the edge of the bed with leg dangling despite being repositioned in the bed.He drops food on himself while eating but refuses to be assisted with feeding.His weight log reviewed weight stable over one month.     Past Medical History:  Diagnosis Date  . ANEMIA-IRON DEFICIENCY 05/27/2007  . ANXIETY 11/14/2007  . BENIGN PROSTATIC HYPERTROPHY 11/14/2007  . CAROTID ARTERY DISEASE 10/02/2010  . Chronic combined systolic and diastolic CHF (congestive heart failure) ([place]) 10/10/2014  . CKD stage 3 due to type 1 diabetes mellitus ([place]) 03/17/2015  . COLONIC POLYPS, HX OF 05/27/2007  . CORONARY ARTERY DISEASE 11/14/2007   prior bypass [phone]; Inferior MI 11/07; Severe 3 vessel disease; occluded SVG to RCA; SVG to OM subtotalled, SVG to diagonal with ostial 70 and 80 mid; EF 45. PCI of SVG to OM. FU myovue showed EF 56 with inferior infarct and very mild peri-infarct ischemia  . DEPRESSION 11/14/2007  . DIABETES MELLITUS, TYPE II 11/14/2007  . DIVERTICULITIS, HX OF 05/27/2007  . DM (diabetes mellitus), type 2 with renal complications ([place]) 5/46/2703  . DVT (deep venous thrombosis) ([place]) 03/21/2011  . Dyspnea 11/09/2014  . Effusion of left knee 03/17/2015  . Failure to thrive in adult 05/03/2015  . FOOT PAIN, BILATERAL 12/08/2010  . Generalized weakness 05/20/2015  . GERD 11/14/2007  . Hearing loss 05/13/2009   Qualifier: Diagnosis of  By: Jenny Reichmann MD, Hunt Oris   . Hyperlipemia 11/14/2007   Qualifier: Diagnosis of  By: Jenny Reichmann MD, Hunt Oris    . HYPERLIPIDEMIA 11/14/2007  . HYPERTENSION 11/14/2007  .  HYPOTENSION, ORTHOSTATIC 03/06/2011  . Intermittent high-grade heart block 09/05/2009   s/p pacemaker  . Ischemic cardiomyopathy    a. echo (10/15):  mod focal basal septal hypertrophy, EF 40-45%, inf AK, Gr 1 DD, Al sclerosis, mild AI, MAC, mild MR, mod LAE, lipomatous hypertrophy, small effusion (no  hemodynamic compromise)   . Memory loss 06/01/2008  . Myalgia and myositis, unspecified 04/24/2011  . Osteoarthrosis, unspecified whether generalized or localized, unspecified site   . OSTEOPOROSIS 06/02/2010  . Other malaise and fatigue 04/24/2011  . Pacemaker-St. Jude 02/02/2008   St. Jude  . Peripheral neuropathy 03/27/2011  . PERIPHERAL VASCULAR DISEASE 11/14/2007  . Prostate cancer ([place])   . Protein-calorie malnutrition, severe ([place]) 05/23/2015  . RENAL INSUFFICIENCY 11/14/2007  . Seborrheic keratosis 06/05/2011  . SECONDARY DM W/PERIPHERAL CIRC D/O UNCONTROLLED 11/07/2010  . Severe back pain   . SYNCOPE 09/08/2008  . Unspecified hearing loss 05/13/2009  . Vertebral compression fracture ([place]) 09/05/2009   Qualifier: Diagnosis of  By: Jenny Reichmann MD, Hunt Oris   . [name] 04/04/2015  . Weight loss 10/10/2014   Past Surgical History:  Procedure Laterality Date  . APPENDECTOMY    . CHOLECYSTECTOMY    . CORONARY ARTERY BYPASS GRAFT  2007  . CYSTOSCOPY    . PACEMAKER INSERTION  2012   Caryl Comes, MD  . prostate needle biopsy    . s/p bilat knee replacement  1992   Paul, MD  . s/p coronary stent x 1      Allergies  Allergen Reactions  . Hydrocodone     hallucination  . Tramadol     hallucination    Allergies as of 04/01/2018      Reactions   Hydrocodone    hallucination   Tramadol    hallucination      Medication List        Accurate as of 04/01/18  2:20 PM. Always use your most recent med list.          acetaminophen 325 MG tablet Commonly known as:  TYLENOL Take 650 mg by mouth every 4 (four) hours as needed.   ALPRAZolam 0.25 MG tablet Commonly known as:  XANAX Take 0.25 mg by mouth 2 (two) times daily as needed for anxiety.   bisacodyl 10 MG suppository Commonly known as:  DULCOLAX Place 10 mg every three (3) days as needed rectally for moderate constipation.   BOOST DIABETIC Liqd Take 237 mLs by mouth 2 (two) times daily. May also have one at [facility] as needed   carbamide  peroxide 6.5 % OTIC solution Commonly known as:  DEBROX 5 drops. Instil 5 - 10 drops in each ear at bedtime 3 times a week every other month   furosemide 20 MG tablet Commonly known as:  LASIX Take 20 mg daily by mouth.   levothyroxine 25 MCG tablet Commonly known as:  SYNTHROID, LEVOTHROID Take 25 mcg by mouth daily.   morphine 20 MG/5ML solution Take 5 mg by mouth every 8 (eight) hours as needed (severe pain or dyspnea).   nitroGLYCERIN 0.4 MG SL tablet Commonly known as:  NITROSTAT Place 0.4 mg under the tongue every 5 (five) minutes as needed for chest pain.   OXYGEN Inhale 1 L/min into the lungs as needed.   risperiDONE 0.25 MG tablet Commonly known as:  RISPERDAL Take 0.25 mg by mouth every other day.   SYSTANE BALANCE 0.6 % Soln Generic drug:  Propylene Glycol Apply to eye. One drop both eyes 2 times a  day due to dry eyes   UNABLE TO FIND Place 1 patch onto the skin every 3 (three) days. Med Name: Fentanyl Patch 12 mcg.       Review of Systems  Unable to perform ROS: Dementia (additional information provided by facility nurse )    Immunization History  Administered Date(s) Administered  . Influenza Whole 09/23/2010, 09/23/2012  . Influenza,inj,Quad PF,6+ Mos 10/09/2014  . Influenza-Unspecified 09/10/2013, 09/30/2015, 10/05/2016, 10/03/2017  . PPD Test 05/23/2015  . Pneumococcal Conjugate-13 04/27/2017  . Pneumococcal Polysaccharide-23 03/31/2009  . Td 03/31/2009   Pertinent  Health Maintenance Due  Topic Date Due  . FOOT EXAM  12/28/2017  . HEMOGLOBIN A1C  04/27/2018  . OPHTHALMOLOGY EXAM  06/12/2018  . URINE MICROALBUMIN  07/05/2018  . INFLUENZA VACCINE  07/24/2018  . PNA vac Low Risk Adult  Completed   Fall Risk  07/26/2017 09/26/2015 05/26/2015 04/29/2015 04/04/2015  Falls in the past year? Yes Yes Yes Yes Yes  Number falls in past yr: 1 1 1 1 1   Injury with Fall? No No Yes Yes Yes  Risk Factor Category  - - High Fall Risk - High Fall Risk  Risk for  fall due to : - Impaired balance/gait History of fall(s);Impaired balance/gait;Impaired mobility - History of fall(s);Impaired balance/gait;Impaired mobility  Follow up - Falls evaluation completed Falls evaluation completed - Falls evaluation completed    Vitals:   04/01/18 1056  BP: 113/78  Pulse: 68  Resp: 18  Temp: (!) 97 F (36.1 C)  SpO2: 97%  Weight: 128 lb 9.6 oz (58.3 kg)  Height: 5' 10"  (1.778 m)   Body mass index is 18.45 kg/m. Physical Exam  Constitutional:  Thin built frail elderly in no acute distress   HENT:  Head: Normocephalic.  Mouth/Throat: Oropharynx is clear and moist. No oropharyngeal exudate.  Eyes: Pupils are equal, round, and reactive to light. Conjunctivae and EOM are normal. Right eye exhibits no discharge. Left eye exhibits no discharge. No scleral icterus.  Neck: Normal range of motion. No JVD present. No thyromegaly present.  Cardiovascular: Intact distal pulses. Exam reveals no gallop and no friction rub.  Murmur heard. Pacemaker   Pulmonary/Chest: Effort normal and breath sounds normal. No respiratory distress. He has no wheezes. He has no rales. He exhibits no tenderness.  Oxygen 2 liters via nasal cannula in place   Abdominal: Soft. Bowel sounds are normal. He exhibits no distension. There is no tenderness. There is no rebound and no guarding.  LBM 03/31/2018   Genitourinary:  Genitourinary Comments: Incontinent   Musculoskeletal: He exhibits no edema or tenderness.  Moves x 4 extremities without difficulty.unsteady gait.   Lymphadenopathy:    He has no cervical adenopathy.  Neurological: He is alert. Gait abnormal.  Confused at his baseline  Skin: Skin is warm and dry. No rash noted. No erythema. No pallor.  Left upper arm small skin tear without any signs of infections.  Psychiatric: He has a normal mood and affect. His speech is normal and behavior is normal. Judgment normal. He exhibits abnormal recent memory.   Labs reviewed: Recent  Labs    04/11/17 07/29/17 08/05/17  NA 134* 144 138  K 4.4 3.6 3.8  BUN 57* 33* 28*  CREATININE 1.3 1.2 0.8   Recent Labs    07/29/17 08/05/17  AST 15 16  ALT 7* 8*  ALKPHOS 140* 141*   Recent Labs    07/29/17 08/05/17  WBC 6.0 5.4  HGB 12.5* 11.9*  HCT  38* 36*  PLT 162 175   Lab Results  Component Value Date   TSH 3.12 10/03/2017   Lab Results  Component Value Date   HGBA1C 5.7 10/28/2017   Lab Results  Component Value Date   CHOL 132 02/07/2015   HDL 33 (A) 02/07/2015   LDLCALC 76 02/07/2015   LDLDIRECT 85.6 07/19/2010   TRIG 115 02/07/2015   CHOLHDL 4 03/06/2011    Significant Diagnostic Results in last 30 days:  No results found.  Assessment/Plan 1. Hypothyroidism, unspecified type Lab Results  Component Value Date   TSH 3.12 10/03/2017  Continue on levothyroxine 25 mcg tablet daily.check TSH level 04/03/2018.  2. Chronic combined systolic and diastolic CHF (congestive heart failure)  Weight stable.no edema,wheezing or shortness of breath noted.continue on Furosemide 20 mg tablet daily.check BMP 04/03/2018.   3. Chronic respiratory failure with hypoxia  Breathing stable.continue on oxygen 2 liters via nasal cannula.check CBC 04/03/2018  4. Alzheimer's dementia with behavioral disturbance No new behavioral issues.continue to assist with ADL's.continue on risperidone 0.25 mg tablet every other day.Fall and safety precautions.continue to follow up with Hospice service.   Family/ staff Communication: Reviewed plan of care with patient and facility Nurse supervisor   Labs/tests ordered: CBC,BMP and TSH level 04/03/2018   Sandrea Hughs, NP

## 2018-04-03 DIAGNOSIS — I5042 Chronic combined systolic (congestive) and diastolic (congestive) heart failure: Secondary | ICD-10-CM | POA: Diagnosis not present

## 2018-04-03 DIAGNOSIS — J9611 Chronic respiratory failure with hypoxia: Secondary | ICD-10-CM | POA: Diagnosis not present

## 2018-04-03 DIAGNOSIS — E039 Hypothyroidism, unspecified: Secondary | ICD-10-CM | POA: Diagnosis not present

## 2018-04-04 ENCOUNTER — Encounter: Payer: Self-pay | Admitting: Family

## 2018-04-04 ENCOUNTER — Non-Acute Institutional Stay (SKILLED_NURSING_FACILITY): Payer: Medicare Other | Admitting: Family

## 2018-04-04 DIAGNOSIS — R638 Other symptoms and signs concerning food and fluid intake: Secondary | ICD-10-CM | POA: Diagnosis not present

## 2018-04-04 DIAGNOSIS — R05 Cough: Secondary | ICD-10-CM

## 2018-04-04 DIAGNOSIS — R058 Other specified cough: Secondary | ICD-10-CM

## 2018-04-04 NOTE — Progress Notes (Signed)
Location:  [facility] Room Number: 9 Place of Service:  SNF ([phone]) Provider: Dinah Ngetich FNP-C  Blanchie Serve, MD  Patient Care Team: Blanchie Serve, MD as PCP - General (Internal Medicine) Irine Seal, MD as Consulting Physician (Urology) Verl Blalock, Marijo Conception, MD (Inactive) as Consulting Physician (Cardiology) [place], Denice Bors, MD as Consulting Physician (Cardiology) Rigoberto Noel, MD as Consulting Physician (Pulmonary Disease) Ngetich, Nelda Bucks, NP as Nurse Practitioner (Family Medicine)  Extended Emergency Contact Information Primary Emergency Contact: Kathline Magic, [place] of [place] Phone: [phone] Relation: Daughter Secondary Emergency Contact: Johnnette Gourd [place] of [place] Phone: [phone] Mobile Phone: [phone] Relation: Son  Code Status:  DNR Goals of care: Advanced Directive information Advanced Directives 04/04/2018  Does Patient Have a Medical Advance Directive? Yes  Type of Paramedic of [place];Out of facility DNR (pink MOST or yellow form)  Does patient want to make changes to medical advance directive? -  Copy of [place] in Chart? Yes  Pre-existing out of facility DNR order (yellow form or pink MOST form) Yellow form placed in chart (order not valid for inpatient use);Pink MOST form placed in chart (order not valid for inpatient use)     Chief Complaint  Patient presents with  . Acute Visit    cough and congestion    HPI:  Pt is a 84 y.o. male seen today at [facility] for an acute visit for evaluation of cough and congestion x 2 days.He is seen in his room today with daughter,Son and facility Nurse present.Nurse reports patient has had nasal congestion x 1 day had loratadine.Now had a non-productive cough and congestion.He took his medication this morning but seems to have poor appetite. Patient has also been  sleeping more than usual.he was given cough syrup yesterday afternoon and today too. Patient easily awaken and responds verbal but unable to provide HPI and ROS. No fever or chills reported.    Past Medical History:  Diagnosis Date  . ANEMIA-IRON DEFICIENCY 05/27/2007  . ANXIETY 11/14/2007  . BENIGN PROSTATIC HYPERTROPHY 11/14/2007  . CAROTID ARTERY DISEASE 10/02/2010  . Chronic combined systolic and diastolic CHF (congestive heart failure) ([place]) 10/10/2014  . CKD stage 3 due to type 1 diabetes mellitus ([place]) 03/17/2015  . COLONIC POLYPS, HX OF 05/27/2007  . CORONARY ARTERY DISEASE 11/14/2007   prior bypass [phone]; Inferior MI 11/07; Severe 3 vessel disease; occluded SVG to RCA; SVG to OM subtotalled, SVG to diagonal with ostial 70 and 80 mid; EF 45. PCI of SVG to OM. FU myovue showed EF 56 with inferior infarct and very mild peri-infarct ischemia  . DEPRESSION 11/14/2007  . DIABETES MELLITUS, TYPE II 11/14/2007  . DIVERTICULITIS, HX OF 05/27/2007  . DM (diabetes mellitus), type 2 with renal complications ([place]) 8/34/1962  . DVT (deep venous thrombosis) ([place]) 03/21/2011  . Dyspnea 11/09/2014  . Effusion of left knee 03/17/2015  . Failure to thrive in adult 05/03/2015  . FOOT PAIN, BILATERAL 12/08/2010  . Generalized weakness 05/20/2015  . GERD 11/14/2007  . Hearing loss 05/13/2009   Qualifier: Diagnosis of  By: Jenny Reichmann MD, Hunt Oris   . Hyperlipemia 11/14/2007   Qualifier: Diagnosis of  By: Jenny Reichmann MD, Hunt Oris    . HYPERLIPIDEMIA 11/14/2007  . HYPERTENSION 11/14/2007  . HYPOTENSION, ORTHOSTATIC 03/06/2011  . Intermittent high-grade heart block 09/05/2009   s/p pacemaker  .  Ischemic cardiomyopathy    a. echo (10/15):  mod focal basal septal hypertrophy, EF 40-45%, inf AK, Gr 1 DD, Al sclerosis, mild AI, MAC, mild MR, mod LAE, lipomatous hypertrophy, small effusion (no hemodynamic compromise)   . Memory loss 06/01/2008  . Myalgia and myositis, unspecified 04/24/2011  . Osteoarthrosis, unspecified  whether generalized or localized, unspecified site   . OSTEOPOROSIS 06/02/2010  . Other malaise and fatigue 04/24/2011  . Pacemaker-St. Jude 02/02/2008   St. Jude  . Peripheral neuropathy 03/27/2011  . PERIPHERAL VASCULAR DISEASE 11/14/2007  . Prostate cancer ([place])   . Protein-calorie malnutrition, severe ([place]) 05/23/2015  . RENAL INSUFFICIENCY 11/14/2007  . Seborrheic keratosis 06/05/2011  . SECONDARY DM W/PERIPHERAL CIRC D/O UNCONTROLLED 11/07/2010  . Severe back pain   . SYNCOPE 09/08/2008  . Unspecified hearing loss 05/13/2009  . Vertebral compression fracture ([place]) 09/05/2009   Qualifier: Diagnosis of  By: Jenny Reichmann MD, Hunt Oris   . [name] 04/04/2015  . Weight loss 10/10/2014   Past Surgical History:  Procedure Laterality Date  . APPENDECTOMY    . CHOLECYSTECTOMY    . CORONARY ARTERY BYPASS GRAFT  2007  . CYSTOSCOPY    . PACEMAKER INSERTION  2012   Caryl Comes, MD  . prostate needle biopsy    . s/p bilat knee replacement  1992   Paul, MD  . s/p coronary stent x 1      Allergies  Allergen Reactions  . Hydrocodone     hallucination  . Tramadol     hallucination    Outpatient Encounter Medications as of 04/04/2018  Medication Sig  . acetaminophen (TYLENOL) 325 MG tablet Take 650 mg by mouth every 4 (four) hours as needed.  . ALPRAZolam (XANAX) 0.25 MG tablet Take 0.25 mg by mouth 2 (two) times daily as needed for anxiety.  . bisacodyl (DULCOLAX) 10 MG suppository Place 10 mg every three (3) days as needed rectally for moderate constipation.  . carbamide peroxide (DEBROX) 6.5 % otic solution 5 drops. Instil 5 - 10 drops in each ear at bedtime 3 times a week every other month  . furosemide (LASIX) 20 MG tablet Take 20 mg daily by mouth.   . levothyroxine (SYNTHROID, LEVOTHROID) 25 MCG tablet Take 25 mcg by mouth daily.  Marland Kitchen morphine 20 MG/5ML solution Take 5 mg by mouth every 8 (eight) hours as needed (severe pain or dyspnea).  . nitroGLYCERIN (NITROSTAT) 0.4 MG SL tablet Place 0.4 mg under the  tongue every 5 (five) minutes as needed for chest pain.  . Nutritional Supplements (BOOST DIABETIC) LIQD Take 237 mLs by mouth 2 (two) times daily. May also have one at [facility] as needed  . OXYGEN Inhale 1 L/min into the lungs as needed.   Marland Kitchen Propylene Glycol (SYSTANE BALANCE) 0.6 % SOLN Apply to eye. One drop both eyes 2 times a day due to dry eyes  . risperiDONE (RISPERDAL) 0.25 MG tablet Take 0.25 mg by mouth every other day.  Marland Kitchen UNABLE TO FIND Place 1 patch onto the skin every 3 (three) days. Med Name: Fentanyl Patch 12 mcg.   No facility-administered encounter medications on file as of 04/04/2018.     Review of Systems  Unable to perform ROS: Other (additional infomation provided by facility Nurse )    Immunization History  Administered Date(s) Administered  . Influenza Whole 09/23/2010, 09/23/2012  . Influenza,inj,Quad PF,6+ Mos 10/09/2014  . Influenza-Unspecified 09/10/2013, 09/30/2015, 10/05/2016, 10/03/2017  . PPD Test 05/23/2015  . Pneumococcal Conjugate-13 04/27/2017  .  Pneumococcal Polysaccharide-23 03/31/2009  . Td 03/31/2009   Pertinent  Health Maintenance Due  Topic Date Due  . FOOT EXAM  12/28/2017  . HEMOGLOBIN A1C  04/27/2018  . OPHTHALMOLOGY EXAM  06/12/2018  . URINE MICROALBUMIN  07/05/2018  . INFLUENZA VACCINE  07/24/2018  . PNA vac Low Risk Adult  Completed   Fall Risk  07/26/2017 09/26/2015 05/26/2015 04/29/2015 04/04/2015  Falls in the past year? _0   Number falls in past yr: _1 Injury with Fall? No No Yes Yes Yes  Risk Factor Category  - - High Fall Risk - High Fall Risk  Risk for fall due to : - Impaired balance/gait History of fall(s);Impaired balance/gait;Impaired mobility - History of fall(s);Impaired balance/gait;Impaired mobility  Follow up - Falls evaluation completed Falls evaluation completed - Falls evaluation completed    Vitals:   04/04/18 1301  BP: 102/68  Pulse: 69  Resp: (!) 22  Temp: (!) 97.3 F (36.3 C)  SpO2: 97%    Weight: 128 lb 9.6 oz (58.3 kg)  Height: _2  (1.778 m)   Body mass index is 18.45 kg/m. Physical Exam  Constitutional:  Thin built frail elderly in no acute distress   HENT:  Head: Normocephalic.  Mouth/Throat: Oropharynx is clear and moist. No oropharyngeal exudate.  Eyes: Pupils are equal, round, and reactive to light. Conjunctivae and EOM are normal. Right eye exhibits no discharge. Left eye exhibits no discharge. No scleral icterus.  Neck: Neck supple. No thyromegaly present.  Cardiovascular: Exam reveals no gallop and no friction rub.  No murmur heard. Pace maker left upper chest   Pulmonary/Chest: Effort normal. No respiratory distress. He has no wheezes. He exhibits no tenderness.  Bilateral breath sounds rales on upper lobes.   Abdominal: Soft. Bowel sounds are normal. He exhibits no distension. There is no tenderness. There is no rebound and no guarding.  Musculoskeletal: He exhibits no edema or tenderness.  Moves x 4 extremities.  Lymphadenopathy:    He has no cervical adenopathy.  Neurological: He is alert.  Opens eyes to verbal command and answers questions then falls back to sleep.   Skin: Skin is warm and dry. No rash noted. No erythema. No pallor.  Psychiatric: He has a normal mood and affect. His behavior is normal.   Labs reviewed: Recent Labs    04/11/17 07/29/17 08/05/17  NA 134* 144 138  K 4.4 3.6 3.8  BUN 57* 33* 28*  CREATININE 1.3 1.2 0.8   Recent Labs    07/29/17 08/05/17  AST 15 16  ALT 7* 8*  ALKPHOS 140* 141*   Recent Labs    07/29/17 08/05/17  WBC 6.0 5.4  HGB 12.5* 11.9*  HCT 38* 36*  PLT 162 175   Lab Results  Component Value Date   TSH 3.12 10/03/2017   Lab Results  Component Value Date   HGBA1C 5.7 10/28/2017   Lab Results  Component Value Date   CHOL 132 02/07/2015   HDL 33 (A) 02/07/2015   LDLCALC 76 02/07/2015   LDLDIRECT 85.6 07/19/2010   TRIG 115 02/07/2015   CHOLHDL 4 03/06/2011    Significant Diagnostic  Results in last 30 days:  No results found.  Assessment/Plan  Non-productive cough Afebrile.bilateral upper lobes rales noted continue on cough syrup as needed.Obtain portable chest X-ray 2 views to rule out pneumonia.Start DuoNeb 0.5-2.5 (3) mg /3 ml inhale via nebulizer every 6 hours for shortness of breath or wheezing.continue  to monitor.   Decreased oral intake Encourage oral intake and Fluids.continue on supplements.continue to follow up with Hospice service.Progressive decline expected. Continue to monitor.   Family/ staff Communication: Reviewed plan of care with patient and facility Nurse.   Labs/tests ordered:  portable chest X-ray 2 views to rule out pneumonia portable due to patient being non-ambulatory.   Sandrea Hughs, NP

## 2018-04-21 ENCOUNTER — Telehealth: Payer: Self-pay | Admitting: Cardiology

## 2018-04-21 NOTE — Telephone Encounter (Signed)
New Message:     Pt's daughter called and wanted you to know pt passed away last night.

## 2018-04-22 NOTE — Telephone Encounter (Signed)
Spoke with pt dtr, sympathy and condolences given.

## 2018-04-23 DEATH — deceased
